# Patient Record
Sex: Male | Born: 1950 | Race: Black or African American | Hispanic: No | Marital: Single | Smoking: Current every day smoker
Health system: Southern US, Community
[De-identification: ages and names within clinical notes are randomized; demographics above are authoritative.]

## PROBLEM LIST (undated history)

## (undated) DIAGNOSIS — R569 Unspecified convulsions: Secondary | ICD-10-CM

## (undated) DIAGNOSIS — I255 Ischemic cardiomyopathy: Secondary | ICD-10-CM

## (undated) DIAGNOSIS — I10 Essential (primary) hypertension: Secondary | ICD-10-CM

## (undated) DIAGNOSIS — I209 Angina pectoris, unspecified: Secondary | ICD-10-CM

## (undated) DIAGNOSIS — F19239 Other psychoactive substance dependence with withdrawal, unspecified: Secondary | ICD-10-CM

## (undated) DIAGNOSIS — F19939 Other psychoactive substance use, unspecified with withdrawal, unspecified: Secondary | ICD-10-CM

## (undated) DIAGNOSIS — I509 Heart failure, unspecified: Secondary | ICD-10-CM

## (undated) DIAGNOSIS — Z720 Tobacco use: Secondary | ICD-10-CM

## (undated) DIAGNOSIS — I2510 Atherosclerotic heart disease of native coronary artery without angina pectoris: Secondary | ICD-10-CM

## (undated) DIAGNOSIS — I739 Peripheral vascular disease, unspecified: Secondary | ICD-10-CM

## (undated) DIAGNOSIS — I219 Acute myocardial infarction, unspecified: Secondary | ICD-10-CM

## (undated) DIAGNOSIS — F1010 Alcohol abuse, uncomplicated: Secondary | ICD-10-CM

## (undated) DIAGNOSIS — E785 Hyperlipidemia, unspecified: Secondary | ICD-10-CM

## (undated) DIAGNOSIS — M1990 Unspecified osteoarthritis, unspecified site: Secondary | ICD-10-CM

## (undated) DIAGNOSIS — I639 Cerebral infarction, unspecified: Secondary | ICD-10-CM

## (undated) HISTORY — PX: HERNIA REPAIR: SHX51

## (undated) HISTORY — DX: Unspecified convulsions: R56.9

---

## 2009-03-11 ENCOUNTER — Ambulatory Visit (HOSPITAL_COMMUNITY): Admission: RE | Admit: 2009-03-11 | Discharge: 2009-03-11 | Payer: Self-pay | Admitting: Family Medicine

## 2010-07-15 LAB — COMPREHENSIVE METABOLIC PANEL
Alkaline Phosphatase: 40 U/L (ref 39–117)
BUN: 12 mg/dL (ref 6–23)
CO2: 27 mEq/L (ref 19–32)
Chloride: 102 mEq/L (ref 96–112)
Glucose, Bld: 89 mg/dL (ref 70–99)
Potassium: 3.9 mEq/L (ref 3.5–5.1)
Total Bilirubin: 0.8 mg/dL (ref 0.3–1.2)

## 2010-07-15 LAB — CBC
HCT: 43.9 % (ref 39.0–52.0)
Hemoglobin: 15 g/dL (ref 13.0–17.0)
RBC: 5.06 MIL/uL (ref 4.22–5.81)
WBC: 5.6 10*3/uL (ref 4.0–10.5)

## 2010-07-15 LAB — TSH: TSH: 0.7 u[IU]/mL (ref 0.350–4.500)

## 2010-07-15 LAB — PSA: PSA: 1.42 ng/mL (ref 0.10–4.00)

## 2010-07-15 LAB — LIPID PANEL: VLDL: 12 mg/dL (ref 0–40)

## 2010-07-15 LAB — MICROALBUMIN, URINE: Microalb, Ur: 1.58 mg/dL (ref 0.00–1.89)

## 2011-05-05 DIAGNOSIS — I10 Essential (primary) hypertension: Secondary | ICD-10-CM | POA: Diagnosis not present

## 2011-05-06 DIAGNOSIS — I10 Essential (primary) hypertension: Secondary | ICD-10-CM | POA: Diagnosis not present

## 2011-05-06 DIAGNOSIS — E785 Hyperlipidemia, unspecified: Secondary | ICD-10-CM | POA: Diagnosis not present

## 2011-12-23 DIAGNOSIS — I10 Essential (primary) hypertension: Secondary | ICD-10-CM | POA: Diagnosis not present

## 2012-01-13 DIAGNOSIS — I10 Essential (primary) hypertension: Secondary | ICD-10-CM | POA: Diagnosis not present

## 2012-01-25 ENCOUNTER — Other Ambulatory Visit: Payer: Self-pay | Admitting: Family Medicine

## 2012-01-25 ENCOUNTER — Ambulatory Visit
Admission: RE | Admit: 2012-01-25 | Discharge: 2012-01-25 | Disposition: A | Discharge status: Home / Self Care | Payer: Medicare Other | Source: Ambulatory Visit | Attending: Family Medicine | Admitting: Family Medicine

## 2012-01-25 DIAGNOSIS — F17200 Nicotine dependence, unspecified, uncomplicated: Secondary | ICD-10-CM

## 2012-01-25 DIAGNOSIS — I10 Essential (primary) hypertension: Secondary | ICD-10-CM

## 2012-01-25 DIAGNOSIS — Z125 Encounter for screening for malignant neoplasm of prostate: Secondary | ICD-10-CM | POA: Diagnosis not present

## 2012-02-02 ENCOUNTER — Emergency Department (HOSPITAL_COMMUNITY)
Admission: EM | Admit: 2012-02-02 | Discharge: 2012-02-03 | Disposition: A | Discharge status: Home / Self Care | Payer: Medicare Other | Attending: Emergency Medicine | Admitting: Emergency Medicine

## 2012-02-02 ENCOUNTER — Encounter (HOSPITAL_COMMUNITY): Payer: Self-pay | Admitting: Emergency Medicine

## 2012-02-02 DIAGNOSIS — T783XXA Angioneurotic edema, initial encounter: Secondary | ICD-10-CM | POA: Insufficient documentation

## 2012-02-02 DIAGNOSIS — I10 Essential (primary) hypertension: Secondary | ICD-10-CM | POA: Insufficient documentation

## 2012-02-02 DIAGNOSIS — Z888 Allergy status to other drugs, medicaments and biological substances status: Secondary | ICD-10-CM | POA: Diagnosis not present

## 2012-02-02 DIAGNOSIS — F17200 Nicotine dependence, unspecified, uncomplicated: Secondary | ICD-10-CM | POA: Diagnosis not present

## 2012-02-02 MED ORDER — PREDNISONE 20 MG PO TABS: 40.0000 mg | ORAL_TABLET | Freq: Every day | ORAL | Status: DC

## 2012-02-02 MED ORDER — DIPHENHYDRAMINE HCL 25 MG PO CAPS
25.0000 mg | ORAL_CAPSULE | Freq: Once | ORAL | Status: AC
  Administered 2012-02-02: 25 mg via ORAL
  Filled 2012-02-02: qty 1

## 2012-02-02 MED ORDER — FAMOTIDINE 20 MG PO TABS
20.0000 mg | ORAL_TABLET | Freq: Once | ORAL | Status: AC
  Administered 2012-02-02: 20 mg via ORAL
  Filled 2012-02-02: qty 1

## 2012-02-02 MED ORDER — PREDNISONE 20 MG PO TABS
60.0000 mg | ORAL_TABLET | Freq: Once | ORAL | Status: AC
  Administered 2012-02-02: 60 mg via ORAL
  Filled 2012-02-02: qty 3

## 2012-02-02 NOTE — ED Notes (Signed)
MD Pickering at bedside updating patient.  

## 2012-02-02 NOTE — ED Provider Notes (Signed)
History     CSN: [id number]  Arrival date & time 02/02/12  2012   First MD Initiated Contact with Patient 02/02/12 2117      No chief complaint on file.   (Consider location/radiation/quality/duration/timing/severity/associated sxs/prior treatment) The history is provided by the patient.   patient presents with lip swelling. He is on lisinopril. It began today. He states that he woke with it. He states it began of the lower lip and then around 2 hours prior to arrival the upper lip began to swell. No trouble swallowing. No tongue swelling. No no difficulty breathing. He has not had episodes like this before.   Past Medical History  Diagnosis Date  . Hypertension     History reviewed. No pertinent past surgical history.  No family history on file.  History  Substance Use Topics  . Smoking status: Current Every Day Smoker  . Smokeless tobacco: Not on file  . Alcohol Use: Yes      Review of Systems  Constitutional: Negative for activity change and appetite change.  HENT: Negative for neck stiffness.        Lip swelling.  Eyes: Negative for pain.  Respiratory: Negative for chest tightness and shortness of breath.   Cardiovascular: Negative for chest pain and leg swelling.  Gastrointestinal: Negative for nausea, vomiting, abdominal pain and diarrhea.  Genitourinary: Negative for flank pain.  Musculoskeletal: Negative for back pain.  Skin: Negative for rash.  Neurological: Negative for weakness, numbness and headaches.  Psychiatric/Behavioral: Negative for behavioral problems.    Allergies  Lisinopril  Home Medications   Current Outpatient Rx  Name Route Sig Dispense Refill  . AMLODIPINE BESYLATE 10 MG PO TABS Oral Take 10 mg by mouth daily.    . FUROSEMIDE 20 MG PO TABS Oral Take 20 mg by mouth daily.    . MELOXICAM 7.5 MG PO TABS Oral Take 7.5 mg by mouth 2 (two) times daily.    [name] METOPROLOL TARTRATE 50 MG PO TABS Oral Take 50 mg by mouth 2 (two) times daily.     [name] PRAVASTATIN SODIUM 20 MG PO TABS Oral Take 20 mg by mouth daily.    [name] PREDNISONE 20 MG PO TABS Oral Take 2 tablets (40 mg total) by mouth daily. 6 tablet 0    BP 121/81  Pulse 62  Temp 97.7 F (36.5 C) (Oral)  Resp 20  SpO2 97%  Physical Exam  Constitutional: He is oriented to person, place, and time. He appears well-developed and well-nourished.  HENT:       Some swelling of left side of upper lip and right side lower lip. On reevaluation has some increased swelling on the left side of the lower lip. No posterior pharyngeal edema.  Cardiovascular: Normal rate and regular rhythm.   Pulmonary/Chest: Effort normal and breath sounds normal.  Abdominal: Soft. Bowel sounds are normal.  Musculoskeletal: Normal range of motion.  Neurological: He is alert and oriented to person, place, and time.    ED Course  Procedures (including critical care time)  Labs Reviewed - No data to display No results found.   1. Angioedema of lips       MDM  Angioedema of both lips. His been relatively stable over the few hour ED the visit and began at around 10 AM this morning. I doubt it is progress to a severe disease. Immediate related to lisinopril and it was stopped. He will followup with his primary care Dr. for changes in medication.  Juliet Rude. Rubin Payor, MD 02/03/12 [phone]

## 2012-02-02 NOTE — ED Notes (Addendum)
C/o lips swelling since waking up this morning. Denies injury.  Pt reports he started taking Lisinopril 1 month ago.  Denies sob.  No tongue swelling. NAD

## 2012-02-03 DIAGNOSIS — T7840XA Allergy, unspecified, initial encounter: Secondary | ICD-10-CM | POA: Diagnosis not present

## 2012-03-12 HISTORY — PX: CORONARY ANGIOPLASTY WITH STENT PLACEMENT: SHX49

## 2012-04-02 ENCOUNTER — Encounter (HOSPITAL_COMMUNITY): Payer: Self-pay | Admitting: Nurse Practitioner

## 2012-04-02 ENCOUNTER — Emergency Department (HOSPITAL_COMMUNITY): Payer: Medicare Other

## 2012-04-02 ENCOUNTER — Inpatient Hospital Stay (HOSPITAL_COMMUNITY)
Admission: EM | Admit: 2012-04-02 | Discharge: 2012-04-03 | DRG: 065 | Disposition: A | Discharge status: Home / Self Care | Payer: Medicare Other | Attending: Internal Medicine | Admitting: Internal Medicine

## 2012-04-02 DIAGNOSIS — R29810 Facial weakness: Secondary | ICD-10-CM | POA: Diagnosis present

## 2012-04-02 DIAGNOSIS — I10 Essential (primary) hypertension: Secondary | ICD-10-CM | POA: Diagnosis not present

## 2012-04-02 DIAGNOSIS — I6529 Occlusion and stenosis of unspecified carotid artery: Secondary | ICD-10-CM | POA: Diagnosis not present

## 2012-04-02 DIAGNOSIS — F17200 Nicotine dependence, unspecified, uncomplicated: Secondary | ICD-10-CM | POA: Diagnosis present

## 2012-04-02 DIAGNOSIS — I6340 Cerebral infarction due to embolism of unspecified cerebral artery: Secondary | ICD-10-CM | POA: Diagnosis not present

## 2012-04-02 DIAGNOSIS — I059 Rheumatic mitral valve disease, unspecified: Secondary | ICD-10-CM | POA: Diagnosis not present

## 2012-04-02 DIAGNOSIS — G459 Transient cerebral ischemic attack, unspecified: Secondary | ICD-10-CM | POA: Diagnosis present

## 2012-04-02 DIAGNOSIS — R5383 Other fatigue: Secondary | ICD-10-CM | POA: Diagnosis not present

## 2012-04-02 DIAGNOSIS — G9389 Other specified disorders of brain: Secondary | ICD-10-CM | POA: Diagnosis not present

## 2012-04-02 DIAGNOSIS — Z9861 Coronary angioplasty status: Secondary | ICD-10-CM | POA: Diagnosis not present

## 2012-04-02 DIAGNOSIS — Z7982 Long term (current) use of aspirin: Secondary | ICD-10-CM | POA: Diagnosis not present

## 2012-04-02 DIAGNOSIS — G8190 Hemiplegia, unspecified affecting unspecified side: Secondary | ICD-10-CM | POA: Insufficient documentation

## 2012-04-02 DIAGNOSIS — R5381 Other malaise: Secondary | ICD-10-CM | POA: Diagnosis not present

## 2012-04-02 DIAGNOSIS — I2510 Atherosclerotic heart disease of native coronary artery without angina pectoris: Secondary | ICD-10-CM | POA: Diagnosis present

## 2012-04-02 DIAGNOSIS — I6350 Cerebral infarction due to unspecified occlusion or stenosis of unspecified cerebral artery: Principal | ICD-10-CM | POA: Diagnosis present

## 2012-04-02 DIAGNOSIS — Z79899 Other long term (current) drug therapy: Secondary | ICD-10-CM

## 2012-04-02 LAB — CBC
MCH: 27.7 pg (ref 26.0–34.0)
Platelets: 230 10*3/uL (ref 150–400)
RBC: 5.23 MIL/uL (ref 4.22–5.81)
RDW: 14.9 % (ref 11.5–15.5)
WBC: 5.4 10*3/uL (ref 4.0–10.5)

## 2012-04-02 LAB — COMPREHENSIVE METABOLIC PANEL
ALT: 27 U/L (ref 0–53)
AST: 30 U/L (ref 0–37)
Albumin: 4.3 g/dL (ref 3.5–5.2)
Alkaline Phosphatase: 51 U/L (ref 39–117)
GFR calc Af Amer: 82 mL/min — ABNORMAL LOW (ref >90)
Glucose, Bld: 146 mg/dL — ABNORMAL HIGH (ref 70–99)
Potassium: 3.4 mEq/L — ABNORMAL LOW (ref 3.5–5.1)
Sodium: 135 mEq/L (ref 135–145)
Total Protein: 8.3 g/dL (ref 6.0–8.3)

## 2012-04-02 LAB — PROTIME-INR
INR: 0.92 (ref 0.00–1.49)
Prothrombin Time: 12.3 seconds (ref 11.6–15.2)

## 2012-04-02 LAB — DIFFERENTIAL
Basophils Absolute: 0 10*3/uL (ref 0.0–0.1)
Eosinophils Absolute: 0.3 10*3/uL (ref 0.0–0.7)
Lymphocytes Relative: 43 % (ref 12–46)
Lymphs Abs: 2.4 10*3/uL (ref 0.7–4.0)
Neutrophils Relative %: 43 % (ref 43–77)

## 2012-04-02 LAB — POCT I-STAT TROPONIN I

## 2012-04-02 MED ORDER — MELOXICAM 7.5 MG PO TABS
7.5000 mg | ORAL_TABLET | Freq: Two times a day (BID) | ORAL | Status: DC
  Administered 2012-04-02 – 2012-04-03 (×2): 7.5 mg via ORAL
  Filled 2012-04-02 (×3): qty 1

## 2012-04-02 MED ORDER — INFLUENZA VIRUS VACC SPLIT PF IM SUSP
0.5000 mL | INTRAMUSCULAR | Status: DC
  Filled 2012-04-02: qty 0.5

## 2012-04-02 MED ORDER — SIMVASTATIN 10 MG PO TABS
10.0000 mg | ORAL_TABLET | Freq: Every day | ORAL | Status: DC
  Filled 2012-04-02: qty 1

## 2012-04-02 MED ORDER — AMLODIPINE BESYLATE 10 MG PO TABS
10.0000 mg | ORAL_TABLET | Freq: Every day | ORAL | Status: DC
  Administered 2012-04-03: 10 mg via ORAL
  Filled 2012-04-02: qty 1

## 2012-04-02 MED ORDER — METOPROLOL TARTRATE 50 MG PO TABS
50.0000 mg | ORAL_TABLET | Freq: Two times a day (BID) | ORAL | Status: DC
  Administered 2012-04-02 – 2012-04-03 (×2): 50 mg via ORAL
  Filled 2012-04-02 (×3): qty 1

## 2012-04-02 MED ORDER — FUROSEMIDE 20 MG PO TABS
20.0000 mg | ORAL_TABLET | Freq: Every day | ORAL | Status: DC
  Administered 2012-04-03: 20 mg via ORAL
  Filled 2012-04-02: qty 1

## 2012-04-02 MED ORDER — ASPIRIN EC 325 MG PO TBEC: 325.0000 mg | DELAYED_RELEASE_TABLET | Freq: Every day | ORAL | Status: DC

## 2012-04-02 MED ORDER — STROKE: EARLY STAGES OF RECOVERY BOOK
Freq: Once | Status: AC
  Administered 2012-04-02: 23:00:00
  Filled 2012-04-02: qty 1

## 2012-04-02 NOTE — ED Notes (Signed)
Family at bedside. 

## 2012-04-02 NOTE — ED Notes (Signed)
Patient transported to CT 

## 2012-04-02 NOTE — ED Notes (Signed)
Pt states approx 45 minutes ago ( 1730) he felt heaviness and numbness in L arm, states it has "Gotten a little better." pt appears to have L side facial droop. Denies pain. A&Ox4, speech is clear, grips are = bilaterally.

## 2012-04-02 NOTE — ED Notes (Signed)
RN on [facility] requested that MRI be performed before transferring to the floor.

## 2012-04-02 NOTE — H&P (Signed)
PCP:   [name], MD   Chief Complaint:  Left hand weak  HPI: 61 yo male h/o cad s/p stent 2008 in [place] comes in with sudden onset of left arm weakness/numbness and left facial drooping.  No prior h/o cva.  Symptoms quickly resolved in the ED.  He usually takes a baby asa a day but in the last month he accidentally got the full dose and has been taking full dose asa for a month.  No previous illnesses.  No fevers/n/v/d.  No cp or sob.  No abd pain.  hes feeling back to normal now.  Review of Systems:  O/w neg  Past Medical History: Past Medical History  Diagnosis Date  . Hypertension    Past Surgical History  Procedure Date  . Hernia repair     Medications: Prior to Admission medications   Medication Sig Start Date End Date Taking? Authorizing Provider  amLODipine (NORVASC) 10 MG tablet Take 10 mg by mouth daily.   Yes Historical Provider, MD  aspirin EC 325 MG tablet Take 325 mg by mouth daily.   Yes Historical Provider, MD  furosemide (LASIX) 20 MG tablet Take 20 mg by mouth daily.   Yes Historical Provider, MD  meloxicam (MOBIC) 7.5 MG tablet Take 7.5 mg by mouth 2 (two) times daily.   Yes Historical Provider, MD  metoprolol (LOPRESSOR) 50 MG tablet Take 50 mg by mouth 2 (two) times daily.   Yes Historical Provider, MD  pravastatin (PRAVACHOL) 20 MG tablet Take 20 mg by mouth daily.   Yes Historical Provider, MD    Allergies:   Allergies  Allergen Reactions  . Lisinopril Swelling    Social History:  reports that he has been smoking.  He does not have any smokeless tobacco history on file. He reports that he drinks alcohol. He reports that he does not use illicit drugs.  Family History: History reviewed. No pertinent family history.  Physical Exam: Filed Vitals:   04/02/12 1858 04/02/12 1900 04/02/12 1915 04/02/12 1930  BP:  154/100 155/100 143/88  Pulse:  69 80 77  Temp: 98.8 F (37.1 C)     TempSrc:      Resp:  22 18 16   SpO2:  98% 98% 98%    General appearance: alert, cooperative and no distress Neck: no JVD and supple, symmetrical, trachea midline Lungs: clear to auscultation bilaterally Heart: regular rate and rhythm, S1, S2 normal, no murmur, click, rub or gallop Abdomen: soft, non-tender; bowel sounds normal; no masses,  no organomegaly Extremities: extremities normal, atraumatic, no cyanosis or edema Pulses: 2+ and symmetric Skin: Skin color, texture, turgor normal. No rashes or lesions Neurologic: Grossly normal    Labs on Admission:   [facility] 04/02/12 1803  NA 135  K 3.4*  CL 95*  CO2 25  GLUCOSE 146*  BUN 12  CREATININE 1.11  CALCIUM 10.2  MG --  PHOS --    Basename 04/02/12 1803  AST 30  ALT 27  ALKPHOS 51  BILITOT 0.3  PROT 8.3  ALBUMIN 4.3    Basename 04/02/12 1803  WBC 5.4  NEUTROABS 2.4  HGB 14.5  HCT 43.1  MCV 82.4  PLT 230    Basename 04/02/12 1803  CKTOTAL --  CKMB --  CKMBINDEX --  TROPONINI <0.30   Radiological Exams on Admission: Ct Head Wo Contrast  04/02/2012  *RADIOLOGY REPORT*  Clinical Data: Code stroke, left-sided facial droop  CT HEAD WITHOUT CONTRAST  Technique:  Contiguous axial images  were obtained from the base of the skull through the vertex without contrast.  Comparison: None.  Findings: Prominent bilateral perivascular spaces are incidentally noted image 10.  Right frontal encephalomalacia is identified. No acute hemorrhage, acute infarction, or mass lesion is seen.  No midline shift.  Mild ethmoid mucoperiosteal thickening left maxillary sinus wall sclerosis.  No skull fracture.  IMPRESSION: No acute intracranial finding.  Remote right frontal encephalomalacia. Critical Value/emergent results were called by telephone at the time of interpretation on 04/03/2011 at 6:20 p.m. to Dr. Thad Ranger, who verbally acknowledged these results.   Original Report Authenticated By: Christiana Pellant, M.D.     Assessment/Plan  61 yo male with tia/cva symptoms which seem to be  resolved and mildly elevated troponin Principal Problem:  *Unspecified transient cerebral ischemia Active Problems:  Hemiplegia, unspecified, affecting nondominant side  Hypertension  CAD (coronary artery disease) s/p stent 2008 in [place]  Neuro already evaluated pt.  Place on full dose asa, full cva w/u including eeg.  ekg show q waves in inf lead and twi inflat leads.  Will also ask cards to evaluate pt with mildly elevated trop.  Cont to serial his troponins for now.  Place on tele.  Full code.  Evonte Prestage A 04/02/2012, 8:03 PM

## 2012-04-02 NOTE — Consult Note (Addendum)
Referring Physician: Ranae Palms    Chief Complaint: Left facial droop and left arm numbness and weakness  HPI: Randall Patel is an 61 y.o. male who reports that he was watching television and began to feel some numbness in his left hand.  When he got up to feed the dog he was unable to hold the dog food in his left hand and kept dropping everything.  He told his sister about his symptoms and he was brought to the ED.  Left facial droop was noted as well.  Current NIHSS of 0 with near complete resolution of symptoms.    LSN: [id number] tPA Given: No: Resolution of symptoms  Past Medical History  Diagnosis Date  . Hypertension     Past Surgical History  Procedure Date  . Hernia repair     Family history: Has 3 sisters and a brother all of which have had heart attacks.  His mother died of cancer.  His father died of a heart attack.    Social History:  reports that he has been smoking.  He does not have any smokeless tobacco history on file. He reports that he drinks alcohol. He reports that he does not use illicit drugs.  Patient drinks daily but has not had anything to drink since yesterday.  On disability.    Allergies:  Allergies  Allergen Reactions  . Lisinopril Swelling    Medications: I have reviewed the patient's current medications. Prior to Admission:  Current outpatient prescriptions:amLODipine (NORVASC) 10 MG tablet, Take 10 mg by mouth daily., Disp: , Rfl: ;  furosemide (LASIX) 20 MG tablet, Take 20 mg by mouth daily., Disp: , Rfl: ;  meloxicam (MOBIC) 7.5 MG tablet, Take 7.5 mg by mouth 2 (two) times daily., Disp: , Rfl: ;  metoprolol (LOPRESSOR) 50 MG tablet, Take 50 mg by mouth 2 (two) times daily., Disp: , Rfl:  pravastatin (PRAVACHOL) 20 MG tablet, Take 20 mg by mouth daily., Disp: , Rfl:   ROS: History obtained from the patient  General ROS: negative for - chills, fatigue, fever, night sweats, weight gain or weight loss Psychological ROS: negative for - behavioral  disorder, hallucinations, memory difficulties, mood swings or suicidal ideation Ophthalmic ROS: negative for - blurry vision, double vision, eye pain or loss of vision ENT ROS: negative for - epistaxis, nasal discharge, oral lesions, sore throat, tinnitus or vertigo Allergy and Immunology ROS: negative for - hives or itchy/watery eyes Hematological and Lymphatic ROS: negative for - bleeding problems, bruising or swollen lymph nodes Endocrine ROS: negative for - galactorrhea, hair pattern changes, polydipsia/polyuria or temperature intolerance Respiratory ROS: negative for - cough, hemoptysis, shortness of breath or wheezing Cardiovascular ROS: negative for - chest pain, dyspnea on exertion, edema or irregular heartbeat Gastrointestinal ROS: negative for - abdominal pain, diarrhea, hematemesis, nausea/vomiting or stool incontinence Genito-Urinary ROS: negative for - dysuria, hematuria, incontinence or urinary frequency/urgency Musculoskeletal ROS: negative for - joint swelling or muscular weakness Neurological ROS: as noted in HPI Dermatological ROS: negative for rash and skin lesion changes  Physical Examination: Blood pressure 161/100, pulse 95, temperature 98.7 F (37.1 C), temperature source Oral, resp. rate 18, SpO2 99.00%.  Neurologic Examination: Mental Status: Alert, oriented, thought content appropriate.  Speech fluent without evidence of aphasia.  Able to follow 3 step commands without difficulty. Cranial Nerves: II: Discs flat bilaterally; Visual fields grossly normal, pupils equal, round, reactive to light and accommodation III,IV, VI: ptosis not present, extra-ocular motions intact bilaterally V,VII: decreased left NLF, facial light  touch sensation normal bilaterally VIII: hearing normal bilaterally IX,X: gag reflex present XI: bilateral shoulder shrug XII: midline tongue extension Motor: Right : Upper extremity   5/5    Left:     Upper extremity   5/5  Lower extremity    5/5     Lower extremity   5/5 Tone and bulk:normal tone throughout; no atrophy noted Sensory: Pinprick and light touch decreased in the third and fourth digits on the left hand Deep Tendon Reflexes: 2+ and symmetric throughout Plantars: Right: downgoing   Left: downgoing Cerebellar: normal finger-to-nose and normal heel-to-shin test Gait: not tested CV: pulses palpable throughout   Laboratory Studies:  Basic Metabolic Panel: No results found for this basename: NA:5,K:5,CL:5,CO2:5,GLUCOSE:5,BUN:5,CREATININE:5,CALCIUM:3,MG:5,PHOS:5 in the last 168 hours  Liver Function Tests: No results found for this basename: AST:5,ALT:5,ALKPHOS:5,BILITOT:5,PROT:5,ALBUMIN:5 in the last 168 hours No results found for this basename: LIPASE:5,AMYLASE:5 in the last 168 hours No results found for this basename: AMMONIA:3 in the last 168 hours  CBC: No results found for this basename: WBC:5,NEUTROABS:5,HGB:5,HCT:5,MCV:5,PLT:5 in the last 168 hours  Cardiac Enzymes: No results found for this basename: CKTOTAL:5,CKMB:5,CKMBINDEX:5,TROPONINI:5 in the last 168 hours  BNP: No components found with this basename: POCBNP:5  CBG: No results found for this basename: GLUCAP:5 in the last 168 hours  Microbiology: No results found for this or any previous visit.  Coagulation Studies: No results found for this basename: LABPROT:5,INR:5 in the last 72 hours  Urinalysis: No results found for this basename: COLORURINE:2,APPERANCEUR:2,LABSPEC:2,PHURINE:2,GLUCOSEU:2,HGBUR:2,BILIRUBINUR:2,KETONESUR:2,PROTEINUR:2,UROBILINOGEN:2,NITRITE:2,LEUKOCYTESUR:2 in the last 168 hours  Lipid Panel:    Component Value Date/Time   CHOL  Value: 290        ATP III CLASSIFICATION:  <200     mg/dL   Desirable  161-096  mg/dL   Borderline High  >=045    mg/dL   High       * 40/98/1191 1003   TRIG 62 03/11/2009 1003   HDL 105 03/11/2009 1003   CHOLHDL 2.8 03/11/2009 1003   VLDL 12 03/11/2009 1003   LDLCALC  Value: 173         Total Cholesterol/HDL:CHD Risk Coronary Heart Disease Risk Table                     Men   Women  1/2 Average Risk   3.4   3.3  Average Risk       5.0   4.4  2 X Average Risk   9.6   7.1  3 X Average Risk  23.4   11.0        Use the calculated Patient Ratio above and the CHD Risk Table to determine the patient's CHD Risk.        ATP III CLASSIFICATION (LDL):  <100     mg/dL   Optimal  478-295  mg/dL   Near or Above                    Optimal  130-159  mg/dL   Borderline  621-308  mg/dL   High  >657     mg/dL   Very High* 84/69/6295 1003    HgbA1C:  No results found for this basename: HGBA1C    Urine Drug Screen:   No results found for this basename: labopia, cocainscrnur, labbenz, amphetmu, thcu, labbarb    Alcohol Level: No results found for this basename: ETH:2 in the last 168 hours   Imaging: Ct Head Wo Contrast  04/02/2012  *RADIOLOGY REPORT*  Clinical  Data: Code stroke, left-sided facial droop  CT HEAD WITHOUT CONTRAST  Technique:  Contiguous axial images were obtained from the base of the skull through the vertex without contrast.  Comparison: None.  Findings: Prominent bilateral perivascular spaces are incidentally noted image 10.  Right frontal encephalomalacia is identified. No acute hemorrhage, acute infarction, or mass lesion is seen.  No midline shift.  Mild ethmoid mucoperiosteal thickening left maxillary sinus wall sclerosis.  No skull fracture.  IMPRESSION: No acute intracranial finding.  Remote right frontal encephalomalacia. Critical Value/emergent results were called by telephone at the time of interpretation on 04/03/2011 at 6:20 p.m. to Dr. Thad Ranger, who verbally acknowledged these results.   Original Report Authenticated By: Christiana Pellant, M.D.     Assessment: 61 y.o. male presenting with left facial droop and left upper extremity numbness and weakness that has resolved.  Patient describes symptoms as marching up his arm.  With vascular risk factors can not rue out TIA.  CT  reviewed though and shows right frontal encephalomalcia. Patient describes no relevant history.  Unclear etiology.  Due to finding can not rule out seizure as well.  Stroke Risk Factors - hyperlipidemia, hypertension, smoking and CAD  Plan: 1. HgbA1c, fasting lipid panel 2. MRI, MRA  of the brain without contrast 3. Echocardiogram 4. Carotid dopplers 5. Prophylactic therapy-Antiplatelet med: Aspirin - dose 325mg  daily 6. Risk factor modification 7. Telemetry monitoring 8. Frequent neuro checks 9. EEG  Case discussed with Dr. Rene Paci, MD [facility] [phone] 04/02/2012, 6:33 PM

## 2012-04-02 NOTE — ED Provider Notes (Signed)
History     CSN: [id number]  Arrival date & time 04/02/12  1754   First MD Initiated Contact with Patient 04/02/12 1803      Chief Complaint  Patient presents with  . Code Stroke    (Consider location/radiation/quality/duration/timing/severity/associated sxs/prior treatment) HPI Pt had L arm numbness and weakness with L lower facial droop starting at 1730 today. Symptoms have now improved though continues to have numbness in 3rd and 4th digits of L hand and mild facial droop. Denies fever, chills, neck pain, HA, CP, SOB, nausea or vomiting Past Medical History  Diagnosis Date  . Hypertension     Past Surgical History  Procedure Date  . Hernia repair     History reviewed. No pertinent family history.  History  Substance Use Topics  . Smoking status: Current Every Day Smoker  . Smokeless tobacco: Not on file  . Alcohol Use: Yes      Review of Systems  Constitutional: Negative for fever, chills and fatigue.  HENT: Negative for trouble swallowing, neck pain and neck stiffness.   Eyes: Negative for visual disturbance.  Respiratory: Negative for shortness of breath.   Cardiovascular: Negative for chest pain, palpitations and leg swelling.  Gastrointestinal: Negative for nausea, vomiting, abdominal pain, diarrhea and constipation.  Musculoskeletal: Negative for myalgias and back pain.  Skin: Negative for rash and wound.  Neurological: Positive for facial asymmetry, weakness and numbness. Negative for dizziness, seizures, syncope, speech difficulty, light-headedness and headaches.  All other systems reviewed and are negative.    Allergies  Lisinopril  Home Medications   Current Outpatient Rx  Name  Route  Sig  Dispense  Refill  . AMLODIPINE BESYLATE 10 MG PO TABS   Oral   Take 10 mg by mouth daily.         . FUROSEMIDE 20 MG PO TABS   Oral   Take 20 mg by mouth daily.         . MELOXICAM 7.5 MG PO TABS   Oral   Take 7.5 mg by mouth 2 (two) times  daily.         [name] METOPROLOL TARTRATE 50 MG PO TABS   Oral   Take 50 mg by mouth 2 (two) times daily.         [name] PRAVASTATIN SODIUM 20 MG PO TABS   Oral   Take 20 mg by mouth daily.           BP 154/100  Pulse 69  Temp 98.8 F (37.1 C) (Oral)  Resp 22  SpO2 98%  Physical Exam  Nursing note and vitals reviewed. Constitutional: He is oriented to person, place, and time. He appears well-developed and well-nourished. No distress.  HENT:  Head: Normocephalic and atraumatic.  Mouth/Throat: Oropharynx is clear and moist.  Eyes: EOM are normal. Pupils are equal, round, and reactive to light.  Neck: Normal range of motion. Neck supple.  Cardiovascular: Normal rate and regular rhythm.   Pulmonary/Chest: Effort normal and breath sounds normal. No respiratory distress. He has no wheezes. He has no rales. He exhibits no tenderness.  Abdominal: Soft. Bowel sounds are normal. He exhibits no distension and no mass. There is no tenderness. There is no rebound and no guarding.  Musculoskeletal: Normal range of motion. He exhibits no edema and no tenderness.  Neurological: He is alert and oriented to person, place, and time.       5/5 motor in all ext. Finger to nose bl initact. Decreased sensation in  3rd and 4th digits of L hand otherwise normal. Question mild L mouth droop otherwise CN II-XII intact.   Skin: Skin is warm and dry. No rash noted. No erythema.  Psychiatric: He has a normal mood and affect. His behavior is normal.    ED Course  Procedures (including critical care time)  Labs Reviewed  COMPREHENSIVE METABOLIC PANEL - Abnormal; Notable for the following:    Potassium 3.4 (*)     Chloride 95 (*)     Glucose, Bld 146 (*)     GFR calc non Af Amer 71 (*)     GFR calc Af Amer 82 (*)     All other components within normal limits  POCT I-STAT TROPONIN I - Abnormal; Notable for the following:    Troponin i, poc 0.15 (*)     All other components within normal limits   PROTIME-INR  APTT  CBC  DIFFERENTIAL  TROPONIN I   Ct Head Wo Contrast  04/02/2012  *RADIOLOGY REPORT*  Clinical Data: Code stroke, left-sided facial droop  CT HEAD WITHOUT CONTRAST  Technique:  Contiguous axial images were obtained from the base of the skull through the vertex without contrast.  Comparison: None.  Findings: Prominent bilateral perivascular spaces are incidentally noted image 10.  Right frontal encephalomalacia is identified. No acute hemorrhage, acute infarction, or mass lesion is seen.  No midline shift.  Mild ethmoid mucoperiosteal thickening left maxillary sinus wall sclerosis.  No skull fracture.  IMPRESSION: No acute intracranial finding.  Remote right frontal encephalomalacia. Critical Value/emergent results were called by telephone at the time of interpretation on 04/03/2011 at 6:20 p.m. to Dr. Thad Ranger, who verbally acknowledged these results.   Original Report Authenticated By: Christiana Pellant, M.D.      1. Unspecified transient cerebral ischemia   2. Hemiplegia, unspecified, affecting nondominant side      Date: 04/02/2012  Rate: 94  Rhythm: normal sinus rhythm  QRS Axis: normal  Intervals: normal  ST/T Wave abnormalities: nonspecific T wave changes  Conduction Disutrbances:none  Narrative Interpretation:   Old EKG Reviewed: none available T wave inversion in inferior and lateral leads. No old for comparison   MDM  Seen by neurology. Not candidate for tPA. Admit to medicine  Discussed with Dr Onalee Hua. Will admit to tele floor. Asked to have cards consult.       Loren Racer, MD 04/02/12 [phone]

## 2012-04-02 NOTE — ED Notes (Signed)
Activated CODE STROKE via [facility]

## 2012-04-03 ENCOUNTER — Inpatient Hospital Stay (HOSPITAL_COMMUNITY): Payer: Medicare Other

## 2012-04-03 DIAGNOSIS — I059 Rheumatic mitral valve disease, unspecified: Secondary | ICD-10-CM

## 2012-04-03 DIAGNOSIS — I6340 Cerebral infarction due to embolism of unspecified cerebral artery: Secondary | ICD-10-CM | POA: Insufficient documentation

## 2012-04-03 LAB — LIPID PANEL
Cholesterol: 286 mg/dL — ABNORMAL HIGH (ref 0–200)
HDL: 109 mg/dL (ref >39)
Total CHOL/HDL Ratio: 2.6 RATIO
Triglycerides: 108 mg/dL (ref <150)

## 2012-04-03 LAB — TROPONIN I: Troponin I: <0.3 ng/mL (ref <0.30)

## 2012-04-03 MED ORDER — CLOPIDOGREL BISULFATE 75 MG PO TABS: 75.0000 mg | ORAL_TABLET | Freq: Every day | ORAL | Status: DC

## 2012-04-03 MED ORDER — CLOPIDOGREL BISULFATE 75 MG PO TABS
75.0000 mg | ORAL_TABLET | Freq: Every day | ORAL | Status: DC
  Administered 2012-04-03: 75 mg via ORAL
  Filled 2012-04-03: qty 1

## 2012-04-03 MED ORDER — ATORVASTATIN CALCIUM 40 MG PO TABS: 40.0000 mg | ORAL_TABLET | Freq: Every day | ORAL | Status: DC

## 2012-04-03 NOTE — Evaluation (Signed)
Physical Therapy Evaluation/Discharge Note Patient Details Name: Randall Patel MRN: [id number] DOB: 1950-10-21 Today's Date: 04/03/2012 Time: 7829-5621 PT Time Calculation (min): 12 min  PT Assessment / Plan / Recommendation Clinical Impression  61 y.o. male admitted to [facility] for left sided arm and face numbness and weakness.  MRI/A confirmed acute stroke.  Pt's symptoms have 95% resolved and his mobility and strength seem to be at baseline.  He has no acute or f/u PT or OT needs at this time and is safe to discharge home when MD deems he is ready.  I did review with him signs and symptoms of a stroke and why it is important to get to the hospital fast.  He verbalized understanding.      PT Assessment  Patent does not need any further PT services    Follow Up Recommendations  No PT follow up    Does the patient have the potential to tolerate intense rehabilitation    NA  Barriers to Discharge  none      Equipment Recommendations  None recommended by PT    Recommendations for Other Services   none     Precautions / Restrictions Precautions Precautions: None   Pertinent Vitals/Pain No reports of pain      Mobility  Bed Mobility Bed Mobility: Supine to Sit;Sitting - Scoot to Edge of Bed Supine to Sit: 7: Independent Sitting - Scoot to [place] of Bed: 7: Independent Transfers Transfers: Sit to Stand;Stand to Sit Sit to Stand: 7: Independent Stand to Sit: 7: Independent Ambulation/Gait Ambulation/Gait Assistance: 7: Independent Ambulation Distance (Feet): 150 Feet Assistive device: None Ambulation/Gait Assistance Details: no assistive device, walking and talking, normal speed Gait Pattern: Within Functional Limits      Visit Information  Last PT Received On: 04/03/12 Assistance Needed: +1 Reason Eval/Treat Not Completed: Patient at procedure or test/unavailable (at MRI)    Subjective Data  Subjective: Pt reports the only deficit that he has left now is some numbness  in his 3rd finger of his left hand.   Patient Stated Goal: Go home today.  Not have another stroke   Prior Functioning  Home Living Lives With: Family Available Help at Discharge: Family Type of Home: House Prior Function Vocation: On disability Communication Communication: No difficulties Dominant Hand: Right    Cognition  Overall Cognitive Status: Appears within functional limits for tasks assessed/performed Arousal/Alertness: Awake/alert    Extremity/Trunk Assessment Right Upper Extremity Assessment RUE ROM/Strength/Tone: Within functional levels Left Upper Extremity Assessment LUE ROM/Strength/Tone: Within functional levels Right Lower Extremity Assessment RLE ROM/Strength/Tone: Within functional levels Left Lower Extremity Assessment LLE ROM/Strength/Tone: Within functional levels   Balance Balance Balance Assessed: Yes High Level Balance High Level Balance Comments: 360 degree turn, tandem stand bil, pick up object from floor all independently  End of Session PT - End of Session Activity Tolerance: Patient tolerated treatment well Patient left: in bed;Other (comment) (seated EOB) Nurse Communication: Mobility status    Randall Patel, PT, DPT [phone]   04/03/2012, 11:57 AM

## 2012-04-03 NOTE — Progress Notes (Signed)
Utilization review completed.  

## 2012-04-03 NOTE — Progress Notes (Signed)
Routine EEG completed.  

## 2012-04-03 NOTE — Evaluation (Signed)
Speech Language Pathology Evaluation Patient Details Name: Randall Patel MRN: [id number] DOB: [birth year] Today's Date: 04/03/2012 Time: 1044-1100 SLP Time Calculation (min): 16 min  Problem List:  Patient Active Problem List  Diagnosis  . Unspecified transient cerebral ischemia  . Hemiplegia, unspecified, affecting nondominant side  . Hypertension  . CAD (coronary artery disease) s/p stent 2008 in [place]  . Cerebral embolism with cerebral infarction   Past Medical History:  Past Medical History  Diagnosis Date  . Hypertension   . MI (myocardial infarction)    Past Surgical History:  Past Surgical History  Procedure Date  . Hernia repair    HPI:  61 yo male h/o cad s/p stent 2008 in [place] comes in with sudden onset of left arm weakness/numbness and left facial drooping. No prior h/o cva. Symptoms quickly resolved in the ED. He usually takes a baby asa a day but in the last month he accidentally got the full dose and has been taking full dose asa for a month. No previous illnesses. No fevers/n/v/d. No cp or sob. No abd pain. hes feeling back to normal now. MRI shows Small patchy acute infarcts in the right motor strip and underlying white matter.    Assessment / Plan / Recommendation Clinical Impression  Pt presents with adequate cognitive linguistic function. No acute deficits noted. Slight left labial droop still evident with no impact on speech intelligibility. Pt does not need SLP f/u at this time. Discussed imporetance of f/u with MD if cognitive deficits arise after d/c. Discussed signs of stroke. No further education needed. WIll sign off.     SLP Assessment  Patient does not need any further Speech Lanaguage Pathology Services    Follow Up Recommendations       Frequency and Duration        Pertinent Vitals/Pain NA   SLP Goals     SLP Evaluation Prior Functioning  Cognitive/Linguistic Baseline: Within functional limits Type of Home: House Lives With:   (sister) Available Help at Discharge: Family Vocation: On disability   Cognition  Overall Cognitive Status: Appears within functional limits for tasks assessed Arousal/Alertness: Awake/alert Orientation Level: Oriented X4 Attention: Alternating Alternating Attention: Appears intact Memory: Appears intact Awareness: Appears intact Problem Solving: Appears intact Safety/Judgment: Appears intact    Comprehension  Auditory Comprehension Overall Auditory Comprehension: Appears within functional limits for tasks assessed    Expression Verbal Expression Overall Verbal Expression: Appears within functional limits for tasks assessed   Oral / Motor Oral Motor/Sensory Function Overall Oral Motor/Sensory Function: Impaired Labial ROM: Within Functional Limits Labial Symmetry: Abnormal symmetry left Labial Strength: Within Functional Limits Labial Sensation: Within Functional Limits Lingual ROM: Within Functional Limits Lingual Symmetry: Within Functional Limits Lingual Strength: Within Functional Limits Lingual Sensation: Within Functional Limits Facial ROM: Within Functional Limits Facial Symmetry: Within Functional Limits Facial Strength: Within Functional Limits Facial Sensation: Within Functional Limits Velum: Within Functional Limits Mandible: Within Functional Limits Motor Speech Overall Motor Speech: Appears within functional limits for tasks assessed   GO    [facility], MA CCC-SLP [phone]  Claudine Mouton 04/03/2012, 11:15 AM

## 2012-04-03 NOTE — Progress Notes (Signed)
*  PRELIMINARY RESULTS* Echocardiogram 2D Echocardiogram has been performed.  Jeryl Columbia 04/03/2012, 9:21 AM

## 2012-04-03 NOTE — Progress Notes (Signed)
Subjective: Patient reports no new symptoms.  Tip of third finger is numb.  Fourth finger is no longer numb.  MRI of the brain has been reviewed and shows acute small patchy infarcts in the right motor strip.  MRA shows poor flow in the sylvian division of the right MCA.  Further work up pending.  On further conversation with the patient it seems that he has been taking an ASA a day at home prior to admission.    Objective: Current vital signs: BP 141/89  Pulse 62  Temp 98.8 F (37.1 C) (Oral)  Resp 18  Ht 5\' 5"  (1.651 m)  Wt 72.077 kg (158 lb 14.4 oz)  BMI 26.44 kg/m2  SpO2 100% Vital signs in last 24 hours: Temp:  [97.5 F (36.4 C)-98.8 F (37.1 C)] 98.8 F (37.1 C) (12/23 0600) Pulse Rate:  [52-96] 62  (12/23 0600) Resp:  [16-25] 18  (12/23 0600) BP: (127-161)/(77-100) 141/89 mmHg (12/23 0600) SpO2:  [96 %-100 %] 100 % (12/23 0600) Weight:  [72.077 kg (158 lb 14.4 oz)] 72.077 kg (158 lb 14.4 oz) (12/22 2229)  Intake/Output from previous day: 12/22 0701 - 12/23 0700 In: 480 [P.O.:480] Out: 475 [Urine:475] Intake/Output this shift:   Nutritional status: NPO  Neurologic Exam: Mental Status:  Alert, oriented, thought content appropriate. Speech fluent without evidence of aphasia. Able to follow 3 step commands without difficulty.  Cranial Nerves:  II: Discs flat bilaterally; Visual fields grossly normal, pupils equal, round, reactive to light and accommodation  III,IV, VI: ptosis not present, extra-ocular motions intact bilaterally  V,VII: decreased left NLF, facial light touch sensation normal bilaterally  VIII: hearing normal bilaterally  IX,X: gag reflex present  XI: bilateral shoulder shrug  XII: midline tongue extension  Motor:  Right : Upper extremity 5/5 Left: Upper extremity 5/5  Lower extremity 5/5 Lower extremity 5/5  Tone and bulk:normal tone throughout; no atrophy noted  Sensory: Pinprick and light touch decreased in the third digit on the left hand  Deep  Tendon Reflexes: 2+ and symmetric throughout  Plantars:  Right: downgoing     Left: downgoing  Cerebellar:  normal finger-to-nose and normal heel-to-shin test  Gait: not tested  CV: pulses palpable throughout    Lab Results: Basic Metabolic Panel:  Lab 04/02/12 6578  NA 135  K 3.4*  CL 95*  CO2 25  GLUCOSE 146*  BUN 12  CREATININE 1.11  CALCIUM 10.2  MG --  PHOS --    Liver Function Tests:  Lab 04/02/12 1803  AST 30  ALT 27  ALKPHOS 51  BILITOT 0.3  PROT 8.3  ALBUMIN 4.3   No results found for this basename: LIPASE:5,AMYLASE:5 in the last 168 hours No results found for this basename: AMMONIA:3 in the last 168 hours  CBC:  Lab 04/02/12 1803  WBC 5.4  NEUTROABS 2.4  HGB 14.5  HCT 43.1  MCV 82.4  PLT 230    Cardiac Enzymes:  Lab 04/03/12 0530 04/02/12 2225 04/02/12 1803  CKTOTAL -- -- --  CKMB -- -- --  CKMBINDEX -- -- --  TROPONINI <0.30 <0.30 <0.30    Lipid Panel:  Lab 04/03/12 0545  CHOL 286*  TRIG 108  HDL 109  CHOLHDL 2.6  VLDL 22  LDLCALC 469*    CBG: No results found for this basename: GLUCAP:5 in the last 168 hours  Microbiology: No results found for this or any previous visit.  Coagulation Studies:  [facility] 04/02/12 1803  LABPROT 12.3  INR  0.92    Imaging: Ct Head Wo Contrast  04/02/2012  *RADIOLOGY REPORT*  Clinical Data: Code stroke, left-sided facial droop  CT HEAD WITHOUT CONTRAST  Technique:  Contiguous axial images were obtained from the base of the skull through the vertex without contrast.  Comparison: None.  Findings: Prominent bilateral perivascular spaces are incidentally noted image 10.  Right frontal encephalomalacia is identified. No acute hemorrhage, acute infarction, or mass lesion is seen.  No midline shift.  Mild ethmoid mucoperiosteal thickening left maxillary sinus wall sclerosis.  No skull fracture.  IMPRESSION: No acute intracranial finding.  Remote right frontal encephalomalacia. Critical Value/emergent  results were called by telephone at the time of interpretation on 04/03/2011 at 6:20 p.m. to Dr. Thad Ranger, who verbally acknowledged these results.   Original Report Authenticated By: Christiana Pellant, M.D.    Mr Brain Wo Contrast  04/03/2012  *RADIOLOGY REPORT*  Clinical Data:  61 year old male with acute onset left upper extremity weakness, numbness and left facial droop.  Comparison: Head CT 04/02/2012.  MRI HEAD WITHOUT CONTRAST  Technique: Multiplanar, multiecho pulse sequences of the brain and surrounding structures were obtained according to standard protocol without intravenous contrast.  Findings: There are small patchy foci of mild to moderately restricted diffusion in the right motor strip and underlying white matter in the superior frontal gyrus (series 4 images 19 - 21). Only subtle T2 and FLAIR hyperintensity is associated.  No associated mass effect or hemorrhage.  No left hemisphere or posterior fossa diffusion abnormality. Chronic encephalomalacia in the anterior superior right frontal gyrus as seen on the earlier CT.  Major intracranial vascular flow voids are preserved. No midline shift, ventriculomegaly, mass effect, evidence of mass lesion, extra-axial collection or acute intracranial hemorrhage. Cervicomedullary junction and pituitary are within normal limits. Incidental dilated perivascular spaces in the inferior basal ganglia.  Aside from the above findings, gray and white matter signal is within normal limits for age throughout the brain. Negative visualized cervical spine.  Normal bone marrow signal.  Chronic left lamina papyracea fracture.  Mild paranasal sinus mucosal thickening.  Mastoids are clear. Visualized orbit soft tissues are within normal limits.  Negative scalp soft tissues.  IMPRESSION: 1.  Small patchy acute infarcts in the right motor strip and underlying white matter.  No mass effect or hemorrhage. 2.  See MRA findings below. 3.  Chronic right anterior superior frontal  lobe infarct.  MRA HEAD WITHOUT CONTRAST  Technique: Angiographic images of the Circle of Willis were obtained using MRA technique without  intravenous contrast.  Findings: Antegrade flow in the posterior circulation codominant distal vertebral arteries.  PICA vessels are within normal limits. Normal vertebrobasilar junction.  Mild motion artifact at the level of the mid basilar.  No basilar artery stenosis.  SCA and left PCA origins are within normal limits.  Fetal type right PCA origin. Left posterior communicating artery diminutive or absent.  Antegrade flow in both ICA siphons.  The right ICA siphon appears mildly nondominant.  Mild motion artifact. Mild ICA siphon irregularity compatible with atherosclerosis.  No ICA stenosis.  Ophthalmic and posterior communicating artery origins are within normal limits.  Dominant left ICA terminus.  Normal MCA and left ACA origins.  The right ACA A1 segment is absent.  Right ACA supplied from the anterior communicating artery which is within normal limits.  Visualized ACA branches are within normal limits. Mild left MCA M1 segment irregularity.  Left MCA branches are within normal limits.  Right MCA origin is within normal limits.  Incidental right lenticulostriate artery infundibulum.  Right MCA M1 segment is only slightly irregular.  At the right MCA bifurcation a dominant superior posterior M2 branches patent, but there is attenuated flow in the dominant inferior posterior M2 branch (series 9 image 94, series 905 image 12).  IMPRESSION: 1.  Poor flow in the inferior posterior right MCA sylvian division. Right MCA bifurcation and M1 segment are patent. 2.  Mild anterior circulation atherosclerosis otherwise. 3.  Negative posterior circulation.   Original Report Authenticated By: Erskine Speed, M.D.    Mr Mra Head/brain Wo Cm  04/03/2012  *RADIOLOGY REPORT*  Clinical Data:  61 year old male with acute onset left upper extremity weakness, numbness and left facial droop.   Comparison: Head CT 04/02/2012.  MRI HEAD WITHOUT CONTRAST  Technique: Multiplanar, multiecho pulse sequences of the brain and surrounding structures were obtained according to standard protocol without intravenous contrast.  Findings: There are small patchy foci of mild to moderately restricted diffusion in the right motor strip and underlying white matter in the superior frontal gyrus (series 4 images 19 - 21). Only subtle T2 and FLAIR hyperintensity is associated.  No associated mass effect or hemorrhage.  No left hemisphere or posterior fossa diffusion abnormality. Chronic encephalomalacia in the anterior superior right frontal gyrus as seen on the earlier CT.  Major intracranial vascular flow voids are preserved. No midline shift, ventriculomegaly, mass effect, evidence of mass lesion, extra-axial collection or acute intracranial hemorrhage. Cervicomedullary junction and pituitary are within normal limits. Incidental dilated perivascular spaces in the inferior basal ganglia.  Aside from the above findings, gray and white matter signal is within normal limits for age throughout the brain. Negative visualized cervical spine.  Normal bone marrow signal.  Chronic left lamina papyracea fracture.  Mild paranasal sinus mucosal thickening.  Mastoids are clear. Visualized orbit soft tissues are within normal limits.  Negative scalp soft tissues.  IMPRESSION: 1.  Small patchy acute infarcts in the right motor strip and underlying white matter.  No mass effect or hemorrhage. 2.  See MRA findings below. 3.  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  MRA HEAD WITHOUT CONTRAST  Technique: Angiographic images of the Circle of Willis were obtained using MRA technique without  intravenous contrast.  Findings: Antegrade flow in the posterior circulation codominant distal vertebral arteries.  PICA vessels are within normal limits. Normal vertebrobasilar junction.  Mild motion artifact at the level of the mid basilar.   No basilar artery stenosis.  SCA and left PCA origins are within normal limits.  Fetal type right PCA origin. Left posterior communicating artery diminutive or absent.  Antegrade flow in both ICA siphons.  The right ICA siphon appears mildly nondominant.  Mild motion artifact. Mild ICA siphon irregularity compatible with atherosclerosis.  No ICA stenosis.  Ophthalmic and posterior communicating artery origins are within normal limits.  Dominant left ICA terminus.  Normal MCA and left ACA origins.  The right ACA A1 segment is absent.  Right ACA supplied from the anterior communicating artery which is within normal limits.  Visualized ACA branches are within normal limits. Mild left MCA M1 segment irregularity.  Left MCA branches are within normal limits.  Right MCA origin is within normal limits.  Incidental right lenticulostriate artery infundibulum.  Right MCA M1 segment is only slightly irregular.  At the right MCA bifurcation a dominant superior posterior M2 branches patent, but there is attenuated flow in the dominant inferior posterior M2 branch (series 9 image 94, series 905 image  12).  IMPRESSION: 1.  Poor flow in the inferior posterior right MCA sylvian division. Right MCA bifurcation and M1 segment are patent. 2.  Mild anterior circulation atherosclerosis otherwise. 3.  Negative posterior circulation.   Original Report Authenticated By: Erskine Speed, M.D.     Medications:  I have reviewed the patient's current medications. Scheduled:   . amLODipine  10 mg Oral Daily  . clopidogrel  75 mg Oral Q breakfast  . furosemide  20 mg Oral Daily  . influenza  inactive virus vaccine  0.5 mL Intramuscular Tomorrow-1000  . meloxicam  7.5 mg Oral BID  . metoprolol  50 mg Oral BID  . simvastatin  10 mg Oral q1800    Assessment/Plan:  Patient Active Hospital Problem List: Acute Infarct (04/02/2012)   Assessment: MRI shows acute right motor strip infarcts.  Patient only minimally affected clinically.   Remaining work up pending.  Taking ASA at home   Plan:  1.  D/C ASA  2.  Start Plavix 75mg  daily  3.  Will follow up remaining work up Hemiplegia, unspecified, affecting nondominant side (04/02/2012)   Assessment: Resolved.  Only residual is some minor numbness     LOS: 1 day   Thana Farr, MD [facility] [phone] 04/03/2012  9:50 AM

## 2012-04-03 NOTE — Progress Notes (Signed)
PT Cancellation Note  Patient Details Name: Randall Patel MRN: [id number] DOB: Apr 14, 1950   Cancelled Treatment:    Reason Eval/Treat Not Completed: Patient at procedure or test/unavailable (at MRI)   Rollene Rotunda. Welford Christmas, PT, DPT [phone]   04/03/2012, 8:43 AM

## 2012-04-03 NOTE — Progress Notes (Signed)
Bilateral:  No evidence of hemodynamically significant internal carotid artery stenosis.   Vertebral artery flow is antegrade.     

## 2012-04-03 NOTE — Discharge Summary (Signed)
Physician Discharge Summary  Randall Patel AVW:[id number] DOB: [birth year] DOA: 04/02/2012  PCP: [name], MD  Admit date: 04/02/2012 Discharge date: 04/03/2012  Time spent: 60 minutes  Recommendations for Outpatient Follow-up:  - follow up with PCP.  Discharge Diagnoses:  Principal Problem:  *Unspecified transient cerebral ischemia Active Problems:  Hypertension  CAD (coronary artery disease) s/p stent 2008 in [place]   Discharge Condition: stable  Diet recommendation: heart healthy  Filed Weights   04/02/12 2229  Weight: 72.077 kg (158 lb 14.4 oz)    History of present illness:  61 yo male h/o cad s/p stent 2008 in [place] comes in with sudden onset of left arm weakness/numbness and left facial drooping. No prior h/o cva. Symptoms quickly resolved in the ED. He usually takes a baby asa a day but in the last month he accidentally got the full dose and has been taking full dose asa for a month. No previous illnesses. No fevers/n/v/d. No cp or sob. No abd pain. hes feeling back to normal now.   Hospital Course:  Unspecified transient cerebral ischemia (04/02/2012) - MRI 12.23.2013: Small patchy acute infarcts on right. - carotid no ICA stenosis.  - PT/OT consult no needs  - Swallowing eval. No restriction.  - Plavix, stains for home. - LDL < 160  - no events on telemetry.  - Appreciate neurology consult  - tabacco counseling.   Hypertension () - lasix, Norvasc and metoprolol.  - mildly high, will allow some degree of permesive HTN.  - titrate BP meds. as an outpatient.   CAD (coronary artery disease) s/p stent 2008 in [place] (04/02/2012) - stains, plavix.   Procedures:  Carotid doppler: no ICA  Consultations:  neurology  Discharge Exam: Filed Vitals:   04/03/12 0000 04/03/12 0200 04/03/12 0400 04/03/12 0600  BP: 158/94 150/86 143/95 141/89  Pulse: 52 54 59 62  Temp: 98.4 F (36.9 C) 97.5 F (36.4 C) 97.9 F (36.6 C) 98.8 F (37.1 C)  TempSrc:     Oral  Resp: 18 18 18 18   Height:      Weight:      SpO2: 99% 99% 100% 100%    See progress note Discharge Instructions      Discharge Orders    Future Appointments: Provider: Department: Dept Phone: Center:   04/03/2012 12:45 PM [facility] [phone] None     Future Orders Please Complete By Expires   Diet - low sodium heart healthy      Increase activity slowly          Medication List     As of 04/03/2012 10:22 AM    STOP taking these medications         aspirin EC 325 MG tablet      TAKE these medications         amLODipine 10 MG tablet   Commonly known as: NORVASC   Take 10 mg by mouth daily.      clopidogrel 75 MG tablet   Commonly known as: PLAVIX   Take 1 tablet (75 mg total) by mouth daily with breakfast.      furosemide 20 MG tablet   Commonly known as: LASIX   Take 20 mg by mouth daily.      meloxicam 7.5 MG tablet   Commonly known as: MOBIC   Take 7.5 mg by mouth 2 (two) times daily.      metoprolol 50 MG tablet   Commonly known as: LOPRESSOR  Take 50 mg by mouth 2 (two) times daily.      pravastatin 20 MG tablet   Commonly known as: PRAVACHOL   Take 20 mg by mouth daily.           The results of significant diagnostics from this hospitalization (including imaging, microbiology, ancillary and laboratory) are listed below for reference.    Significant Diagnostic Studies: Ct Head Wo Contrast  04/02/2012  *RADIOLOGY REPORT*  Clinical Data: Code stroke, left-sided facial droop  CT HEAD WITHOUT CONTRAST  Technique:  Contiguous axial images were obtained from the base of the skull through the vertex without contrast.  Comparison: None.  Findings: Prominent bilateral perivascular spaces are incidentally noted image 10.  Right frontal encephalomalacia is identified. No acute hemorrhage, acute infarction, or mass lesion is seen.  No midline shift.  Mild ethmoid mucoperiosteal thickening left maxillary sinus  wall sclerosis.  No skull fracture.  IMPRESSION: No acute intracranial finding.  Remote right frontal encephalomalacia. Critical Value/emergent results were called by telephone at the time of interpretation on 04/03/2011 at 6:20 p.m. to Dr. Thad Ranger, who verbally acknowledged these results.   Original Report Authenticated By: Christiana Pellant, M.D.    Mr Brain Wo Contrast  04/03/2012  *RADIOLOGY REPORT*  Clinical Data:  61 year old male with acute onset left upper extremity weakness, numbness and left facial droop.  Comparison: Head CT 04/02/2012.  MRI HEAD WITHOUT CONTRAST  Technique: Multiplanar, multiecho pulse sequences of the brain and surrounding structures were obtained according to standard protocol without intravenous contrast.  Findings: There are small patchy foci of mild to moderately restricted diffusion in the right motor strip and underlying white matter in the superior frontal gyrus (series 4 images 19 - 21). Only subtle T2 and FLAIR hyperintensity is associated.  No associated mass effect or hemorrhage.  No left hemisphere or posterior fossa diffusion abnormality. Chronic encephalomalacia in the anterior superior right frontal gyrus as seen on the earlier CT.  Major intracranial vascular flow voids are preserved. No midline shift, ventriculomegaly, mass effect, evidence of mass lesion, extra-axial collection or acute intracranial hemorrhage. Cervicomedullary junction and pituitary are within normal limits. Incidental dilated perivascular spaces in the inferior basal ganglia.  Aside from the above findings, gray and white matter signal is within normal limits for age throughout the brain. Negative visualized cervical spine.  Normal bone marrow signal.  Chronic left lamina papyracea fracture.  Mild paranasal sinus mucosal thickening.  Mastoids are clear. Visualized orbit soft tissues are within normal limits.  Negative scalp soft tissues.  IMPRESSION: 1.  Small patchy acute infarcts in the right  motor strip and underlying white matter.  No mass effect or hemorrhage. 2.  See MRA findings below. 3.  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  MRA HEAD WITHOUT CONTRAST  Technique: Angiographic images of the Circle of Willis were obtained using MRA technique without  intravenous contrast.  Findings: Antegrade flow in the posterior circulation codominant distal vertebral arteries.  PICA vessels are within normal limits. Normal vertebrobasilar junction.  Mild motion artifact at the level of the mid basilar.  No basilar artery stenosis.  SCA and left PCA origins are within normal limits.  Fetal type right PCA origin. Left posterior communicating artery diminutive or absent.  Antegrade flow in both ICA siphons.  The right ICA siphon appears mildly nondominant.  Mild motion artifact. Mild ICA siphon irregularity compatible with atherosclerosis.  No ICA stenosis.  Ophthalmic and posterior communicating artery origins are within normal limits.  Dominant  left ICA terminus.  Normal MCA and left ACA origins.  The right ACA A1 segment is absent.  Right ACA supplied from the anterior communicating artery which is within normal limits.  Visualized ACA branches are within normal limits. Mild left MCA M1 segment irregularity.  Left MCA branches are within normal limits.  Right MCA origin is within normal limits.  Incidental right lenticulostriate artery infundibulum.  Right MCA M1 segment is only slightly irregular.  At the right MCA bifurcation a dominant superior posterior M2 branches patent, but there is attenuated flow in the dominant inferior posterior M2 branch (series 9 image 94, series 905 image 12).  IMPRESSION: 1.  Poor flow in the inferior posterior right MCA sylvian division. Right MCA bifurcation and M1 segment are patent. 2.  Mild anterior circulation atherosclerosis otherwise. 3.  Negative posterior circulation.   Original Report Authenticated By: Erskine Speed, M.D.    Mr Mra Head/brain Wo  Cm  04/03/2012  *RADIOLOGY REPORT*  Clinical Data:  61 year old male with acute onset left upper extremity weakness, numbness and left facial droop.  Comparison: Head CT 04/02/2012.  MRI HEAD WITHOUT CONTRAST  Technique: Multiplanar, multiecho pulse sequences of the brain and surrounding structures were obtained according to standard protocol without intravenous contrast.  Findings: There are small patchy foci of mild to moderately restricted diffusion in the right motor strip and underlying white matter in the superior frontal gyrus (series 4 images 19 - 21). Only subtle T2 and FLAIR hyperintensity is associated.  No associated mass effect or hemorrhage.  No left hemisphere or posterior fossa diffusion abnormality. Chronic encephalomalacia in the anterior superior right frontal gyrus as seen on the earlier CT.  Major intracranial vascular flow voids are preserved. No midline shift, ventriculomegaly, mass effect, evidence of mass lesion, extra-axial collection or acute intracranial hemorrhage. Cervicomedullary junction and pituitary are within normal limits. Incidental dilated perivascular spaces in the inferior basal ganglia.  Aside from the above findings, gray and white matter signal is within normal limits for age throughout the brain. Negative visualized cervical spine.  Normal bone marrow signal.  Chronic left lamina papyracea fracture.  Mild paranasal sinus mucosal thickening.  Mastoids are clear. Visualized orbit soft tissues are within normal limits.  Negative scalp soft tissues.  IMPRESSION: 1.  Small patchy acute infarcts in the right motor strip and underlying white matter.  No mass effect or hemorrhage. 2.  See MRA findings below. 3.  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  MRA HEAD WITHOUT CONTRAST  Technique: Angiographic images of the Circle of Willis were obtained using MRA technique without  intravenous contrast.  Findings: Antegrade flow in the posterior circulation codominant  distal vertebral arteries.  PICA vessels are within normal limits. Normal vertebrobasilar junction.  Mild motion artifact at the level of the mid basilar.  No basilar artery stenosis.  SCA and left PCA origins are within normal limits.  Fetal type right PCA origin. Left posterior communicating artery diminutive or absent.  Antegrade flow in both ICA siphons.  The right ICA siphon appears mildly nondominant.  Mild motion artifact. Mild ICA siphon irregularity compatible with atherosclerosis.  No ICA stenosis.  Ophthalmic and posterior communicating artery origins are within normal limits.  Dominant left ICA terminus.  Normal MCA and left ACA origins.  The right ACA A1 segment is absent.  Right ACA supplied from the anterior communicating artery which is within normal limits.  Visualized ACA branches are within normal limits. Mild left MCA M1 segment irregularity.  Left MCA branches are within normal limits.  Right MCA origin is within normal limits.  Incidental right lenticulostriate artery infundibulum.  Right MCA M1 segment is only slightly irregular.  At the right MCA bifurcation a dominant superior posterior M2 branches patent, but there is attenuated flow in the dominant inferior posterior M2 branch (series 9 image 94, series 905 image 12).  IMPRESSION: 1.  Poor flow in the inferior posterior right MCA sylvian division. Right MCA bifurcation and M1 segment are patent. 2.  Mild anterior circulation atherosclerosis otherwise. 3.  Negative posterior circulation.   Original Report Authenticated By: Erskine Speed, M.D.     Microbiology: No results found for this or any previous visit (from the past 240 hour(s)).   Labs: Basic Metabolic Panel:  Lab 04/02/12 4098  NA 135  K 3.4*  CL 95*  CO2 25  GLUCOSE 146*  BUN 12  CREATININE 1.11  CALCIUM 10.2  MG --  PHOS --   Liver Function Tests:  Lab 04/02/12 1803  AST 30  ALT 27  ALKPHOS 51  BILITOT 0.3  PROT 8.3  ALBUMIN 4.3   No results found for  this basename: LIPASE:5,AMYLASE:5 in the last 168 hours No results found for this basename: AMMONIA:5 in the last 168 hours CBC:  Lab 04/02/12 1803  WBC 5.4  NEUTROABS 2.4  HGB 14.5  HCT 43.1  MCV 82.4  PLT 230   Cardiac Enzymes:  Lab 04/03/12 0530 04/02/12 2225 04/02/12 1803  CKTOTAL -- -- --  CKMB -- -- --  CKMBINDEX -- -- --  TROPONINI <0.30 <0.30 <0.30   BNP: BNP (last 3 results) No results found for this basename: PROBNP:3 in the last 8760 hours CBG: No results found for this basename: GLUCAP:5 in the last 168 hours     Signed:  Marinda Elk  [facility] 04/03/2012, 10:22 AM

## 2012-04-03 NOTE — Progress Notes (Signed)
[facility] PROGRESS NOTE  Assessment/Plan: Unspecified transient cerebral ischemia (04/02/2012) - MRI 12.23.2013: Small patchy acute infarcts  - carotid no ICA stenosis. - PT/OT consult pending. - Swallowing eval. No restriction. - Plavix, stains  for home. - LDL < 160 - no events on telemetry. - Appreciate neurology consult - tabacco counseling.  Hypertension () - lasix, Norvasc and metoprolol. - mildly high, will allow some degree of permesive HTN. - titrate BP meds. as an outpatient.  CAD (coronary artery disease) s/p stent 2008 in [place] (04/02/2012) - stains, plavix.  Code Status: full Family Communication: neurology  Disposition Plan: home 1 day.   Consultants:  neurology  Procedures:  MRI 12.23.2013: Small patchy acute infarcts in the right motor strip and  underlying white matter  Antibiotics:  none  HPI/Subjective: No complains  Objective: Filed Vitals:   04/03/12 0000 04/03/12 0200 04/03/12 0400 04/03/12 0600  BP: 158/94 150/86 143/95 141/89  Pulse: 52 54 59 62  Temp: 98.4 F (36.9 C) 97.5 F (36.4 C) 97.9 F (36.6 C) 98.8 F (37.1 C)  TempSrc:    Oral  Resp: 18 18 18 18   Height:      Weight:      SpO2: 99% 99% 100% 100%    Intake/Output Summary (Last 24 hours) at 04/03/12 1005 Last data filed at 04/03/12 0400  Gross per 24 hour  Intake    480 ml  Output    475 ml  Net      5 ml   Filed Weights   04/02/12 2229  Weight: 72.077 kg (158 lb 14.4 oz)    Exam:  General: Alert, awake, oriented x3, in no acute distress.  HEENT: No bruits, no goiter.  Heart: Regular rate and rhythm, without murmurs, rubs, gallops.  Lungs: Good air movement, bilateral air movement.  Abdomen: Soft, nontender, nondistended, positive bowel sounds.  Neuro: muscle strength intact,  light touch decreased in the third digit on the left hand.   Data Reviewed: Basic Metabolic Panel:  Lab 04/02/12 7829  NA 135  K 3.4*  CL 95*  CO2 25  GLUCOSE 146*   BUN 12  CREATININE 1.11  CALCIUM 10.2  MG --  PHOS --   Liver Function Tests:  Lab 04/02/12 1803  AST 30  ALT 27  ALKPHOS 51  BILITOT 0.3  PROT 8.3  ALBUMIN 4.3   No results found for this basename: LIPASE:5,AMYLASE:5 in the last 168 hours No results found for this basename: AMMONIA:5 in the last 168 hours CBC:  Lab 04/02/12 1803  WBC 5.4  NEUTROABS 2.4  HGB 14.5  HCT 43.1  MCV 82.4  PLT 230   Cardiac Enzymes:  Lab 04/03/12 0530 04/02/12 2225 04/02/12 1803  CKTOTAL -- -- --  CKMB -- -- --  CKMBINDEX -- -- --  TROPONINI <0.30 <0.30 <0.30   BNP (last 3 results) No results found for this basename: PROBNP:3 in the last 8760 hours CBG: No results found for this basename: GLUCAP:5 in the last 168 hours  No results found for this or any previous visit (from the past 240 hour(s)).   Studies: Ct Head Wo Contrast  04/02/2012  *RADIOLOGY REPORT*  Clinical Data: Code stroke, left-sided facial droop  CT HEAD WITHOUT CONTRAST  Technique:  Contiguous axial images were obtained from the base of the skull through the vertex without contrast.  Comparison: None.  Findings: Prominent bilateral perivascular spaces are incidentally noted image 10.  Right frontal encephalomalacia is identified. No acute hemorrhage,  acute infarction, or mass lesion is seen.  No midline shift.  Mild ethmoid mucoperiosteal thickening left maxillary sinus wall sclerosis.  No skull fracture.  IMPRESSION: No acute intracranial finding.  Remote right frontal encephalomalacia. Critical Value/emergent results were called by telephone at the time of interpretation on 04/03/2011 at 6:20 p.m. to Dr. Thad Ranger, who verbally acknowledged these results.   Original Report Authenticated By: Christiana Pellant, M.D.    Mr Brain Wo Contrast  04/03/2012  *RADIOLOGY REPORT*  Clinical Data:  61 year old male with acute onset left upper extremity weakness, numbness and left facial droop.  Comparison: Head CT 04/02/2012.  MRI HEAD  WITHOUT CONTRAST  Technique: Multiplanar, multiecho pulse sequences of the brain and surrounding structures were obtained according to standard protocol without intravenous contrast.  Findings: There are small patchy foci of mild to moderately restricted diffusion in the right motor strip and underlying white matter in the superior frontal gyrus (series 4 images 19 - 21). Only subtle T2 and FLAIR hyperintensity is associated.  No associated mass effect or hemorrhage.  No left hemisphere or posterior fossa diffusion abnormality. Chronic encephalomalacia in the anterior superior right frontal gyrus as seen on the earlier CT.  Major intracranial vascular flow voids are preserved. No midline shift, ventriculomegaly, mass effect, evidence of mass lesion, extra-axial collection or acute intracranial hemorrhage. Cervicomedullary junction and pituitary are within normal limits. Incidental dilated perivascular spaces in the inferior basal ganglia.  Aside from the above findings, gray and white matter signal is within normal limits for age throughout the brain. Negative visualized cervical spine.  Normal bone marrow signal.  Chronic left lamina papyracea fracture.  Mild paranasal sinus mucosal thickening.  Mastoids are clear. Visualized orbit soft tissues are within normal limits.  Negative scalp soft tissues.  IMPRESSION: 1.  Small patchy acute infarcts in the right motor strip and underlying white matter.  No mass effect or hemorrhage. 2.  See MRA findings below. 3.  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  MRA HEAD WITHOUT CONTRAST  Technique: Angiographic images of the Circle of Willis were obtained using MRA technique without  intravenous contrast.  Findings: Antegrade flow in the posterior circulation codominant distal vertebral arteries.  PICA vessels are within normal limits. Normal vertebrobasilar junction.  Mild motion artifact at the level of the mid basilar.  No basilar artery stenosis.  SCA and left  PCA origins are within normal limits.  Fetal type right PCA origin. Left posterior communicating artery diminutive or absent.  Antegrade flow in both ICA siphons.  The right ICA siphon appears mildly nondominant.  Mild motion artifact. Mild ICA siphon irregularity compatible with atherosclerosis.  No ICA stenosis.  Ophthalmic and posterior communicating artery origins are within normal limits.  Dominant left ICA terminus.  Normal MCA and left ACA origins.  The right ACA A1 segment is absent.  Right ACA supplied from the anterior communicating artery which is within normal limits.  Visualized ACA branches are within normal limits. Mild left MCA M1 segment irregularity.  Left MCA branches are within normal limits.  Right MCA origin is within normal limits.  Incidental right lenticulostriate artery infundibulum.  Right MCA M1 segment is only slightly irregular.  At the right MCA bifurcation a dominant superior posterior M2 branches patent, but there is attenuated flow in the dominant inferior posterior M2 branch (series 9 image 94, series 905 image 12).  IMPRESSION: 1.  Poor flow in the inferior posterior right MCA sylvian division. Right MCA bifurcation and M1 segment are  patent. 2.  Mild anterior circulation atherosclerosis otherwise. 3.  Negative posterior circulation.   Original Report Authenticated By: Erskine Speed, M.D.    Mr Mra Head/brain Wo Cm  04/03/2012  *RADIOLOGY REPORT*  Clinical Data:  61 year old male with acute onset left upper extremity weakness, numbness and left facial droop.  Comparison: Head CT 04/02/2012.  MRI HEAD WITHOUT CONTRAST  Technique: Multiplanar, multiecho pulse sequences of the brain and surrounding structures were obtained according to standard protocol without intravenous contrast.  Findings: There are small patchy foci of mild to moderately restricted diffusion in the right motor strip and underlying white matter in the superior frontal gyrus (series 4 images 19 - 21). Only  subtle T2 and FLAIR hyperintensity is associated.  No associated mass effect or hemorrhage.  No left hemisphere or posterior fossa diffusion abnormality. Chronic encephalomalacia in the anterior superior right frontal gyrus as seen on the earlier CT.  Major intracranial vascular flow voids are preserved. No midline shift, ventriculomegaly, mass effect, evidence of mass lesion, extra-axial collection or acute intracranial hemorrhage. Cervicomedullary junction and pituitary are within normal limits. Incidental dilated perivascular spaces in the inferior basal ganglia.  Aside from the above findings, gray and white matter signal is within normal limits for age throughout the brain. Negative visualized cervical spine.  Normal bone marrow signal.  Chronic left lamina papyracea fracture.  Mild paranasal sinus mucosal thickening.  Mastoids are clear. Visualized orbit soft tissues are within normal limits.  Negative scalp soft tissues.  IMPRESSION: 1.  Small patchy acute infarcts in the right motor strip and underlying white matter.  No mass effect or hemorrhage. 2.  See MRA findings below. 3.  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  MRA HEAD WITHOUT CONTRAST  Technique: Angiographic images of the Circle of Willis were obtained using MRA technique without  intravenous contrast.  Findings: Antegrade flow in the posterior circulation codominant distal vertebral arteries.  PICA vessels are within normal limits. Normal vertebrobasilar junction.  Mild motion artifact at the level of the mid basilar.  No basilar artery stenosis.  SCA and left PCA origins are within normal limits.  Fetal type right PCA origin. Left posterior communicating artery diminutive or absent.  Antegrade flow in both ICA siphons.  The right ICA siphon appears mildly nondominant.  Mild motion artifact. Mild ICA siphon irregularity compatible with atherosclerosis.  No ICA stenosis.  Ophthalmic and posterior communicating artery origins are  within normal limits.  Dominant left ICA terminus.  Normal MCA and left ACA origins.  The right ACA A1 segment is absent.  Right ACA supplied from the anterior communicating artery which is within normal limits.  Visualized ACA branches are within normal limits. Mild left MCA M1 segment irregularity.  Left MCA branches are within normal limits.  Right MCA origin is within normal limits.  Incidental right lenticulostriate artery infundibulum.  Right MCA M1 segment is only slightly irregular.  At the right MCA bifurcation a dominant superior posterior M2 branches patent, but there is attenuated flow in the dominant inferior posterior M2 branch (series 9 image 94, series 905 image 12).  IMPRESSION: 1.  Poor flow in the inferior posterior right MCA sylvian division. Right MCA bifurcation and M1 segment are patent. 2.  Mild anterior circulation atherosclerosis otherwise. 3.  Negative posterior circulation.   Original Report Authenticated By: Erskine Speed, M.D.     Scheduled Meds:   . amLODipine  10 mg Oral Daily  . clopidogrel  75 mg Oral Q  breakfast  . furosemide  20 mg Oral Daily  . influenza  inactive virus vaccine  0.5 mL Intramuscular Tomorrow-1000  . meloxicam  7.5 mg Oral BID  . metoprolol  50 mg Oral BID  . simvastatin  10 mg Oral q1800   Continuous Infusions:    Marinda Elk  [facility] Pager [phone]. If 8PM-8AM, please contact night-coverage at [email or web address], password [facility] 04/03/2012, 10:05 AM  LOS: 1 day

## 2012-04-03 NOTE — Plan of Care (Signed)
Problem: Phase I Progression Outcomes Goal: Strict NPO til swallow screen done Outcome: Completed/Met Date Met:  04/03/12 Pt kept NPO prior to stroke swallow screen in which he passed. Goal: NIH Stroke Scale-NIHSS done on admission Outcome: Completed/Met Date Met:  04/03/12 Score of a 4 by neurologist in ED.

## 2012-04-03 NOTE — Procedures (Signed)
ELECTROENCEPHALOGRAM REPORT   Patient: Randall Patel       Room #: 6O13 EEG No. ID: [id number] Age: 61 y.o.        Sex: male Referring Physician: Onalee Hua Report Date:  04/03/2012        Interpreting Physician: Thana Farr D  History: [name] is an 61 y.o. male with an episode of left sided numbness and weakness evaluated to rule out seizure  Medications:  I have reviewed the patient's current medications. Scheduled:   . amLODipine  10 mg Oral Daily  . clopidogrel  75 mg Oral Q breakfast  . furosemide  20 mg Oral Daily  . influenza  inactive virus vaccine  0.5 mL Intramuscular Tomorrow-1000  . meloxicam  7.5 mg Oral BID  . metoprolol  50 mg Oral BID  . simvastatin  10 mg Oral q1800    Conditions of Recording:  This is a 16 channel EEG carried out with the patient in the awake and drowsy states.  Description:  The waking background activity consists of a low voltage, symmetrical, fairly well organized, 10-11 Hz alpha activity, seen from the parieto-occipital and posterior temporal regions.  This is poorly sustained.  Low voltage fast activity, poorly organized, is seen anteriorly and is at times superimposed on more posterior regions.  A mixture of theta and alpha rhythms are seen from the central and temporal regions. The patient drowses with slowing to irregular, low voltage theta and beta activity.   Stage II sleep is not obtained. Hyperventilation was not performed.  Intermittent photic stimulation was performed and elicits a symmetrical driving response but fails to elicit any abnormalities.  IMPRESSION: Normal awake electroencephalogram with activation procedures. There are no focal lateralizing or epileptiform features.  Comment:  An EEG with the patient sleep deprived to elicit drowse and light sleep may be desirable to further elicit a possible seizure disorder.     Thana Farr, MD [facility] [phone] 04/03/2012, 10:28 AM

## 2012-04-03 NOTE — Plan of Care (Signed)
Problem: Consults Goal: Stroke - Ischemic/TIA Patient Education See Patient Education Module for education specifics. Outcome: Completed/Met Date Met:  04/03/12 [place] book given to patient. Goal: Skin Care Protocol Initiated - if Braden Score 18 or less If consults are not indicated, leave blank or document N/A Outcome: Not Applicable Date Met:  04/03/12 Pt's skin is WNL but he was encouraged to turn q2hrs.

## 2012-04-11 ENCOUNTER — Encounter (HOSPITAL_COMMUNITY)
Admission: EM | Disposition: A | Discharge status: Home / Self Care | Payer: Self-pay | Source: Home / Self Care | Attending: Cardiovascular Disease

## 2012-04-11 ENCOUNTER — Inpatient Hospital Stay (HOSPITAL_COMMUNITY)
Admission: EM | Admit: 2012-04-11 | Discharge: 2012-04-14 | DRG: 247 | Disposition: A | Discharge status: Home / Self Care | Payer: Medicare Other | Attending: Cardiovascular Disease | Admitting: Cardiovascular Disease

## 2012-04-11 ENCOUNTER — Encounter (HOSPITAL_COMMUNITY): Payer: Self-pay | Admitting: *Deleted

## 2012-04-11 ENCOUNTER — Emergency Department (HOSPITAL_COMMUNITY): Payer: Medicare Other

## 2012-04-11 DIAGNOSIS — I2510 Atherosclerotic heart disease of native coronary artery without angina pectoris: Secondary | ICD-10-CM | POA: Diagnosis not present

## 2012-04-11 DIAGNOSIS — Z8673 Personal history of transient ischemic attack (TIA), and cerebral infarction without residual deficits: Secondary | ICD-10-CM

## 2012-04-11 DIAGNOSIS — Z79899 Other long term (current) drug therapy: Secondary | ICD-10-CM

## 2012-04-11 DIAGNOSIS — I200 Unstable angina: Secondary | ICD-10-CM

## 2012-04-11 DIAGNOSIS — I252 Old myocardial infarction: Secondary | ICD-10-CM

## 2012-04-11 DIAGNOSIS — R51 Headache: Secondary | ICD-10-CM | POA: Diagnosis not present

## 2012-04-11 DIAGNOSIS — R5381 Other malaise: Secondary | ICD-10-CM | POA: Diagnosis not present

## 2012-04-11 DIAGNOSIS — I498 Other specified cardiac arrhythmias: Secondary | ICD-10-CM | POA: Diagnosis present

## 2012-04-11 DIAGNOSIS — Z9861 Coronary angioplasty status: Secondary | ICD-10-CM

## 2012-04-11 DIAGNOSIS — F1010 Alcohol abuse, uncomplicated: Secondary | ICD-10-CM | POA: Diagnosis present

## 2012-04-11 DIAGNOSIS — Z7902 Long term (current) use of antithrombotics/antiplatelets: Secondary | ICD-10-CM

## 2012-04-11 DIAGNOSIS — I249 Acute ischemic heart disease, unspecified: Secondary | ICD-10-CM

## 2012-04-11 DIAGNOSIS — I255 Ischemic cardiomyopathy: Secondary | ICD-10-CM | POA: Diagnosis present

## 2012-04-11 DIAGNOSIS — I10 Essential (primary) hypertension: Secondary | ICD-10-CM | POA: Diagnosis present

## 2012-04-11 DIAGNOSIS — E785 Hyperlipidemia, unspecified: Secondary | ICD-10-CM | POA: Diagnosis present

## 2012-04-11 DIAGNOSIS — R5383 Other fatigue: Secondary | ICD-10-CM | POA: Diagnosis not present

## 2012-04-11 DIAGNOSIS — I2589 Other forms of chronic ischemic heart disease: Secondary | ICD-10-CM | POA: Diagnosis present

## 2012-04-11 DIAGNOSIS — I214 Non-ST elevation (NSTEMI) myocardial infarction: Principal | ICD-10-CM

## 2012-04-11 DIAGNOSIS — I6340 Cerebral infarction due to embolism of unspecified cerebral artery: Secondary | ICD-10-CM

## 2012-04-11 DIAGNOSIS — Z8249 Family history of ischemic heart disease and other diseases of the circulatory system: Secondary | ICD-10-CM

## 2012-04-11 DIAGNOSIS — Z955 Presence of coronary angioplasty implant and graft: Secondary | ICD-10-CM

## 2012-04-11 DIAGNOSIS — F17200 Nicotine dependence, unspecified, uncomplicated: Secondary | ICD-10-CM | POA: Diagnosis present

## 2012-04-11 DIAGNOSIS — I70219 Atherosclerosis of native arteries of extremities with intermittent claudication, unspecified extremity: Secondary | ICD-10-CM | POA: Diagnosis present

## 2012-04-11 DIAGNOSIS — Z7982 Long term (current) use of aspirin: Secondary | ICD-10-CM

## 2012-04-11 HISTORY — PX: PERCUTANEOUS CORONARY INTERVENTION-BALLOON ONLY: SHX6014

## 2012-04-11 HISTORY — PX: LEFT HEART CATHETERIZATION WITH CORONARY ANGIOGRAM: SHX5451

## 2012-04-11 HISTORY — DX: Cerebral infarction, unspecified: I63.9

## 2012-04-11 HISTORY — DX: Hyperlipidemia, unspecified: E78.5

## 2012-04-11 HISTORY — PX: PERCUTANEOUS CORONARY STENT INTERVENTION (PCI-S): SHX5485

## 2012-04-11 HISTORY — DX: Tobacco use: Z72.0

## 2012-04-11 HISTORY — DX: Alcohol abuse, uncomplicated: F10.10

## 2012-04-11 HISTORY — DX: Peripheral vascular disease, unspecified: I73.9

## 2012-04-11 HISTORY — DX: Ischemic cardiomyopathy: I25.5

## 2012-04-11 LAB — APTT: aPTT: 29 s (ref 24–37)

## 2012-04-11 LAB — COMPREHENSIVE METABOLIC PANEL WITH GFR
ALT: 32 U/L (ref 0–53)
AST: 106 U/L — ABNORMAL HIGH (ref 0–37)
Albumin: 4.3 g/dL (ref 3.5–5.2)
CO2: 27 meq/L (ref 19–32)
Chloride: 89 meq/L — ABNORMAL LOW (ref 96–112)
GFR calc non Af Amer: 70 mL/min — ABNORMAL LOW (ref >90)
Sodium: 134 meq/L — ABNORMAL LOW (ref 135–145)
Total Bilirubin: 0.6 mg/dL (ref 0.3–1.2)

## 2012-04-11 LAB — PROTIME-INR
INR: 0.96 (ref 0.00–1.49)
Prothrombin Time: 12.7 s (ref 11.6–15.2)

## 2012-04-11 LAB — TROPONIN I: Troponin I: 5.32 ng/mL (ref <0.30)

## 2012-04-11 LAB — CBC WITH DIFFERENTIAL/PLATELET
Basophils Absolute: 0 10*3/uL (ref 0.0–0.1)
Basophils Relative: 0 % (ref 0–1)
Eosinophils Absolute: 0 10*3/uL (ref 0.0–0.7)
Eosinophils Relative: 0 % (ref 0–5)
HCT: 45.7 % (ref 39.0–52.0)
Hemoglobin: 15.3 g/dL (ref 13.0–17.0)
Lymphocytes Relative: 20 % (ref 12–46)
Lymphs Abs: 1.8 10*3/uL (ref 0.7–4.0)
MCH: 27.5 pg (ref 26.0–34.0)
MCHC: 33.5 g/dL (ref 30.0–36.0)
MCV: 82 fL (ref 78.0–100.0)
Monocytes Absolute: 0.8 10*3/uL (ref 0.1–1.0)
Monocytes Relative: 9 % (ref 3–12)
Neutro Abs: 6.3 10*3/uL (ref 1.7–7.7)
Neutrophils Relative %: 71 % (ref 43–77)
Platelets: 253 10*3/uL (ref 150–400)
RBC: 5.57 MIL/uL (ref 4.22–5.81)
RDW: 14.1 % (ref 11.5–15.5)
WBC: 8.9 10*3/uL (ref 4.0–10.5)

## 2012-04-11 LAB — COMPREHENSIVE METABOLIC PANEL
Alkaline Phosphatase: 42 U/L (ref 39–117)
BUN: 18 mg/dL (ref 6–23)
Calcium: 10.6 mg/dL — ABNORMAL HIGH (ref 8.4–10.5)
Creatinine, Ser: 1.12 mg/dL (ref 0.50–1.35)
GFR calc Af Amer: 81 mL/min — ABNORMAL LOW (ref >90)
Glucose, Bld: 104 mg/dL — ABNORMAL HIGH (ref 70–99)
Potassium: 3 mEq/L — ABNORMAL LOW (ref 3.5–5.1)
Total Protein: 8.6 g/dL — ABNORMAL HIGH (ref 6.0–8.3)

## 2012-04-11 LAB — POCT I-STAT TROPONIN I: Troponin i, poc: 5.01 ng/mL (ref 0.00–0.08)

## 2012-04-11 SURGERY — LEFT HEART CATHETERIZATION WITH CORONARY ANGIOGRAM
Anesthesia: LOCAL

## 2012-04-11 MED ORDER — POTASSIUM CHLORIDE 20 MEQ PO PACK: 40.0000 meq | PACK | Freq: Once | ORAL | Status: DC

## 2012-04-11 MED ORDER — FENTANYL CITRATE 0.05 MG/ML IJ SOLN
INTRAMUSCULAR | Status: AC
  Filled 2012-04-11: qty 2

## 2012-04-11 MED ORDER — CLOPIDOGREL BISULFATE 300 MG PO TABS
ORAL_TABLET | ORAL | Status: AC
  Filled 2012-04-11: qty 2

## 2012-04-11 MED ORDER — ASPIRIN 81 MG PO CHEW
162.0000 mg | CHEWABLE_TABLET | Freq: Once | ORAL | Status: AC
  Administered 2012-04-11: 162 mg via ORAL
  Filled 2012-04-11: qty 2

## 2012-04-11 MED ORDER — HEPARIN BOLUS VIA INFUSION: 4000.0000 [IU] | Freq: Once | INTRAVENOUS | Status: DC

## 2012-04-11 MED ORDER — HEPARIN SODIUM (PORCINE) 5000 UNIT/ML IJ SOLN: 60.0000 [IU]/kg | Freq: Once | INTRAMUSCULAR | Status: DC

## 2012-04-11 MED ORDER — NITROGLYCERIN 0.2 MG/ML ON CALL CATH LAB
INTRAVENOUS | Status: AC
  Filled 2012-04-11: qty 1

## 2012-04-11 MED ORDER — HEPARIN (PORCINE) IN NACL 100-0.45 UNIT/ML-% IJ SOLN
900.0000 [IU]/h | INTRAMUSCULAR | Status: DC
  Filled 2012-04-11: qty 250

## 2012-04-11 MED ORDER — FAMOTIDINE IN NACL 20-0.9 MG/50ML-% IV SOLN
INTRAVENOUS | Status: AC
  Filled 2012-04-11: qty 50

## 2012-04-11 MED ORDER — MIDAZOLAM HCL 2 MG/2ML IJ SOLN
INTRAMUSCULAR | Status: AC
  Filled 2012-04-11: qty 2

## 2012-04-11 MED ORDER — LIDOCAINE HCL (PF) 1 % IJ SOLN
INTRAMUSCULAR | Status: AC
  Filled 2012-04-11: qty 30

## 2012-04-11 MED ORDER — POTASSIUM CHLORIDE IN NACL 20-0.9 MEQ/L-% IV SOLN
Freq: Once | INTRAVENOUS | Status: AC
  Administered 2012-04-11: 22:00:00 via INTRAVENOUS
  Filled 2012-04-11: qty 1000

## 2012-04-11 MED ORDER — HEPARIN (PORCINE) IN NACL 2-0.9 UNIT/ML-% IJ SOLN
INTRAMUSCULAR | Status: AC
  Filled 2012-04-11: qty 1500

## 2012-04-11 MED ORDER — POTASSIUM CHLORIDE CRYS ER 20 MEQ PO TBCR: 40.0000 meq | EXTENDED_RELEASE_TABLET | Freq: Once | ORAL | Status: DC

## 2012-04-11 MED ORDER — BIVALIRUDIN 250 MG IV SOLR
INTRAVENOUS | Status: AC
  Filled 2012-04-11: qty 250

## 2012-04-11 MED ORDER — POTASSIUM CHLORIDE CRYS ER 20 MEQ PO TBCR
20.0000 meq | EXTENDED_RELEASE_TABLET | Freq: Once | ORAL | Status: AC
  Administered 2012-04-11: 40 meq via ORAL
  Filled 2012-04-11: qty 2

## 2012-04-11 NOTE — ED Notes (Signed)
Pt placed on cardiac monitor, IV started, denies any chest pain at this time, states he feels weak.

## 2012-04-11 NOTE — CV Procedure (Addendum)
Cardiac Catheterization Operative Report  Randall Patel [id number] 12/31/201311:54 PM Randall Junes, MD  Procedure Performed:  1. Left Heart Catheterization 2. Selective Coronary Angiography 3. Left ventricular angiogram 4. PTCA/DES x 1 distal RCA 5. PTCA with balloon angioplasty of the posterolateral branch 6. Angioseal right femoral artery  Operator: Verne Carrow, MD  Indication:  61 yo AAM with history of CAD with prior posterolateral stent in 2008 presents with c/o weakness, mild chest pressure. EKG with junctional bradycardia. First troponin 5.0.                                    Procedure Details: The risks, benefits, complications, treatment options, and expected outcomes were discussed with the patient. Emergency consent obtained. The patient gave verbal consent as well. The patient was brought to the cath lab from the ED urgently. The after hours call team was here for another case.  The patient was further sedated with Versed and Fentanyl. The right groin was prepped and draped in the usual manner. Using the modified Seldinger access technique, a 6 French sheath was placed in the right femoral artery. Standard diagnostic catheters were used to perform selective coronary angiography. The patient was found to have total occlusion of the distal RCA. This was felt to be his culprit stenosis. A JR4 guide was used to engage the RCA. The patient was given a bolus of Angiomax and a drip was started. He was given an additional 600 mg po Plavix x 1. When the ACT was greater than 200, I passed a Whisper wire into the distal RCA. A 2.5 x 12 mm balloon was inflated x 3. I then deployed a 2.5 x 20 mm Promus Premier DES in the distal RCA ending the stent just before the bifurcation. This was post-dilated with a 2.75 x 15 mm [place] balloon. After the vessel was opened, it was evident that the posterolateral branch had restenosis in the long stented segment. A 2.0 x 20 mm balloon was  inflated in the posterolateral branch x 3. There was an adequate result in this small caliber vessel. The guide and wire were removed. A pigtail catheter was used to perform a left ventricular angiogram. An Angioseal femoral artery closure device was deployed in the right femoral artery.   There were no immediate complications. The patient was taken to the recovery area in stable condition.   Hemodynamic Findings: Central aortic pressure: 113/75 Left ventricular pressure: 106/5/9  Angiographic Findings:  Left main: Ostial 30% stenosis with heavy calcification.  Left Anterior Descending Artery: Large caliber vessel that courses to the apex. The proximal vessel has 30% stenosis. The mid vessel has a 60% stenosis at the takeoff of a septal perforating branch. Moderate caliber diagonal branch with mild plaque disease.   Circumflex Artery: Moderate caliber vessel. The proximal vessel has diffuse 30% stenosis. The first 2 obtuse marginal branches are very small. The third obtuse marginal branch is a small bifurcating vessel with diffuse 90% stenosis involving both branches. (1.75 mm vessel)  Right Coronary Artery: Large, dominant artery with 50% mid stenosis and 100% distal occlusion.   Left Ventricular Angiogram: LVEF=30-35% with akinesis of the inferior wall.   Impression: 1. NSTEMI 2. Triple vessel CAD with occluded RCA, culprit vessel. 3. Successful PTCA/DES x 1 distal RCA 4. Successful PTCA with balloon angioplasty only of the posterolateral branch.  5. Segmental LV systolic dysfunction  Recommendations: Will continue ASA and  Plavix for at least one year but longer if he tolerates. Will start statin. Will hold beta blocker secondary to bradycardia. He is intolerant to Ace-inhibitors. Will run Angiomax for two hours then stop. Plavix was used. (Effient contraindicated with prior CVA). He has been on long term Plavix. Will check P2Y12 in am. If PRU is greater than 230, will stop Plavix and  load with Brilinta.        Complications:  None. The patient tolerated the procedure well.

## 2012-04-11 NOTE — ED Notes (Signed)
Patient transported to the Cath Lab.

## 2012-04-11 NOTE — ED Provider Notes (Addendum)
History     CSN: [id number]  Arrival date & time 04/11/12  2004   First MD Initiated Contact with Patient 04/11/12 2116      Chief Complaint  Patient presents with  . Fatigue    (Consider location/radiation/quality/duration/timing/severity/associated sxs/prior treatment) HPI Comments: Patient presents to do to feeling excessively fatigued and tired and sleepy since about 9 AM this morning when he arose. He denies fever chills, URI symptoms, chest pain, abdominal pain. He was recently admitted for a stroke. He was taken off aspirin and instead put on Plavix. He reports he does have a history of a heart attack in 2008 and was treated at his previous location. He has a significant history of high blood pressure and high cholesterol. He did take an Alka-Seltzer cold earlier today which she takes when he feels "hung over" in order to help rest. He reports that he used to be a heavy drinker but does not drink excessively any longer. He recently quit smoking last week.  The history is provided by the patient and medical records.    Past Medical History  Diagnosis Date  . Hypertension   . MI (myocardial infarction)   . Stroke     Past Surgical History  Procedure Date  . Hernia repair     No family history on file.  History  Substance Use Topics  . Smoking status: Current Every Day Smoker -- 1.0 packs/day for 40 years  . Smokeless tobacco: Not on file  . Alcohol Use: Yes      Review of Systems  Constitutional: Positive for fatigue.  HENT: Negative for neck pain.   Respiratory: Negative for chest tightness and shortness of breath.   Cardiovascular: Negative for chest pain.  Gastrointestinal: Negative for nausea, vomiting, abdominal pain, diarrhea and constipation.  Neurological: Positive for weakness. Negative for dizziness.  All other systems reviewed and are negative.    Allergies  Lisinopril  Home Medications   Current Outpatient Rx  Name  Route  Sig  Dispense   Refill  . AMLODIPINE BESYLATE 10 MG PO TABS   Oral   Take 10 mg by mouth daily.         [name] CLOPIDOGREL BISULFATE 75 MG PO TABS   Oral   Take 1 tablet (75 mg total) by mouth daily with breakfast.   30 tablet   3   . FUROSEMIDE 20 MG PO TABS   Oral   Take 20 mg by mouth daily.         . MELOXICAM 7.5 MG PO TABS   Oral   Take 7.5 mg by mouth 2 (two) times daily.         [name] METOPROLOL TARTRATE 50 MG PO TABS   Oral   Take 50 mg by mouth 2 (two) times daily.         [name] PRAVASTATIN SODIUM 20 MG PO TABS   Oral   Take 20 mg by mouth daily.           BP 132/90  Pulse 58  Temp 98 F (36.7 C) (Oral)  Resp 17  SpO2 100%  Physical Exam  Nursing note and vitals reviewed. Constitutional: He appears well-developed and well-nourished.  HENT:  Head: Normocephalic and atraumatic.  Eyes: Pupils are equal, round, and reactive to light. No scleral icterus.  Neck: Normal range of motion. Neck supple.  Cardiovascular: Normal rate and regular rhythm.   No murmur heard. Pulmonary/Chest: Effort normal.  Abdominal: Soft.  Neurological: He  is alert. Coordination normal.  Skin: Skin is warm.  Psychiatric: Thought content normal. His mood appears not anxious. His speech is delayed. He is slowed. He is not agitated, not aggressive, is not hyperactive, not withdrawn and not combative. He does not exhibit a depressed mood. He is attentive.    ED Course  Procedures (including critical care time)   CRITICAL CARE Performed by: Lear Ng.   Total critical care time: 30 min  Critical care time was exclusive of separately billable procedures and treating other patients.  Critical care was necessary to treat or prevent imminent or life-threatening deterioration.  Critical care was time spent personally by me on the following activities: development of treatment plan with patient and/or surrogate as well as nursing, discussions with consultants, evaluation of patient's response to  treatment, examination of patient, obtaining history from patient or surrogate, ordering and performing treatments and interventions, ordering and review of laboratory studies, ordering and review of radiographic studies, pulse oximetry and re-evaluation of patient's condition.   Labs Reviewed  COMPREHENSIVE METABOLIC PANEL - Abnormal; Notable for the following:    Sodium 134 (*)     Potassium 3.0 (*)     Chloride 89 (*)     Glucose, Bld 104 (*)     Calcium 10.6 (*)     Total Protein 8.6 (*)     AST 106 (*)     GFR calc non Af Amer 70 (*)     GFR calc Af Amer 81 (*)     All other components within normal limits  POCT I-STAT TROPONIN I - Abnormal; Notable for the following:    Troponin i, poc 5.01 (*)     All other components within normal limits  CBC WITH DIFFERENTIAL  TROPONIN I  APTT  PROTIME-INR   Dg Chest Port 1 View  04/11/2012  *RADIOLOGY REPORT*  Clinical Data: Dull headache and weakness.  History of smoking.  PORTABLE CHEST - 1 VIEW  Comparison: Chest radiograph performed and 05/2011  Findings: The lungs are well-aerated and clear.  There is no evidence of focal opacification, pleural effusion or pneumothorax.  The cardiomediastinal silhouette is borderline normal in size.  No acute osseous abnormalities are seen.  IMPRESSION: No acute cardiopulmonary process seen.   Original Report Authenticated By: Tonia Ghent, M.D.    I reviewed the above PCXR myself  1. Acute coronary syndrome     Room air saturation is 100% and I interpret this to be normal.  EKG performed at time 20:19, shows sinus rhythm with shortened PR interval and inferior T wave inversions. Borderline Q waves are seen suggestive of prior inferior infarct. Voltage criteria for LVH is seen.  Diffuse inverted T wave inversions are no longer seeing compared to EKG from 04/04/2012.  EKG performed at time 21:21 shows a junctional rhythm with no P waves seen at a rate of 58 with widened QRS. This is changed from  previous EKG performed earlier today.  10:02 PM Electrolyte analysis shows that he is mildly hypokalemic with a potassium of 3.0. Given his arrhythmia, will replaced both orally and by IV. The [facility] cardiology, Dr. Shirlee Latch is to see the patient and admit.  MDM   Patient with nonspecific symptoms of fatigue and generalized weakness. No obvious focal deficits are seen however he does seem to have a very minimal left-sided facial droop on examination currently. This may be consistent with his recent diagnosis of stroke. No obvious ischemic changes on EKG, I presume the prior  history of MI would explain his inferior Q waves and inverted T waves. However he does have episodic rhythm change between sinus rhythm and a junctional escape rhythm. His initial 20 care troponin is 5 which is suggestive of acute coronary syndrome. His blood pressure currently is 128/93 and he has no complaints of chest pain or shortness of breath. During my examination, my colleague had spoken to the [facility] cardiologist who will see the patient here in the emergency department. The patient has already taken his Plavix earlier today.        Gavin Pound. Oletta Lamas, MD 04/11/12 2203  Gavin Pound. Oletta Lamas, MD 04/11/12 2213

## 2012-04-11 NOTE — ED Notes (Signed)
Pt taken back to room and placed on monitor, rhythm changes on monitor noted to be different from previous EKG, EKG repeated, shown to Dr. Fonnie Jarvis. Placed on [place] O2 2L. VSS. Denies CP or SOB, "still just generally weak and fatigued". Recently stopped ASA with last admission and started on plavix. "Has taken his plavix today".

## 2012-04-11 NOTE — ED Notes (Addendum)
Report given to KF, RN, Dr. Oletta Lamas at [facility].

## 2012-04-11 NOTE — ED Notes (Addendum)
C/o general weakness/ fatigue, sleepy, no energy, can't sleep, onset 0900 this am, morning meds did not help, also reports dull forehead discomfort "like a hangover, not a HA". ("Here last Sunday 12/22 and dx'd with stroke, had L arm hand numbness, all sx resolved"). Alert, NAD, calm, interactive, MAEx4, speech clear, grip strength and coordination are equal and strong, no droop or drift, PERRL 2mm, no visual field deficits, gaze normal/ intact, (denies: nausea, pain, sob, dizzy, fever, diarrhea, cough, congestion, cold sx), vomited x1 after taking alka seltzer ~ 1230, "thought it would make me feel better", denies smoking or drinking ETOH since 12/25. No pedal edema noted. "just feel real sleepy". meds & d/c summary and recent lab sreviewed

## 2012-04-11 NOTE — Progress Notes (Signed)
ANTICOAGULATION CONSULT NOTE - Initial Consult  Pharmacy Consult for Heparin Indication: chest pain/ACS  Allergies  Allergen Reactions  . Lisinopril Swelling    Patient Measurements: Height: 5' 4.96" (165 cm) Weight: 158 lb 11.7 oz (72 kg) IBW/kg (Calculated) : 61.41   Vital Signs: Temp: 98 F (36.7 C) (12/31 2120) Temp src: Oral (12/31 2120) BP: 132/90 mmHg (12/31 2120) Pulse Rate: 58  (12/31 2120)  Labs:  Basename 04/11/12 2136 04/11/12 2029  HGB -- 15.3  HCT -- 45.7  PLT -- 253  APTT 29 --  LABPROT 12.7 --  INR 0.96 --  HEPARINUNFRC -- --  CREATININE -- 1.12  CKTOTAL -- --  CKMB -- --  TROPONINI -- --    Estimated Creatinine Clearance: 61.7 ml/min (by C-G formula based on Cr of 1.12).   Medical History: Past Medical History  Diagnosis Date  . Hypertension   . MI (myocardial infarction)   . Stroke     Medications:  Norvasc  Plavix  Lasix  Mobic  Lopressor  Pravachol    Assessment: 61 yo male with ACS for Heparin  Goal of Therapy:  Heparin level 0.3-0.7 units/ml Monitor platelets by anticoagulation protocol: Yes   Plan:  Heparin 4000 units IV bolus, then 900 units/hr Check heparin level in 6 hours.  Gearldine Looney, Gary Fleet 04/11/2012,10:18 PM

## 2012-04-11 NOTE — H&P (Addendum)
Physician History and Physical    Randall Patel MRN: [id number] DOB/AGE: 1951-02-14 61 y.o. Admit date: 04/11/2012  Primary Care Physician: Clyda Greener Primary Cardiologist: None  HPI:  61 yo with history of CAD s/p MI in 2008 and CVA in 12/13 presents with NSTEMI.  Patient was admitted earlier this month with left arm weakness/numbness and left facial droop.  He was found to have a CVA.  He was started on Plavix and ASA was stopped.  He had an MI in 2008 in [place] with PCI.  He has not had a cardiac cath since then and is not followed by a cardiologist.  Echo in 12/13 showed EF 45-50% with diffuse hypokinesis.  Yesterday, he had central chest pain that was relatively mild for about 30 minutes.  Today, he woke up feeling like he had a hangover. He was weak all over and nauseated.  He vomited once.  All day long, he felt profoundly weak and mildly nauseated.  He did not have chest pain. This sensation did not improve and he was unable to get to sleep.  Therefore, he came to the ER.  In the ER, he was noted to have troponin of 5.  ECG showed NSR with old inferior MI, similar to prior though inferior and anterolateral T wave inversions were more prominent.  He was noted on telemetry to go in and out of an accelerated junctional rhythm in the 50s with RBBB/LAFB morphology.  Patient additionally reports claudication-type symptoms in his left leg.   PMH: 1. HTN 2. CVA: 12/13 with left arm weakness/numbness and left facial droop.  3. CAD: MI in 2008 with PCI (not sure which vessel).  NSTEMI 12/13.  4. Smoker: quit 04/06/12 5. ETOH abuse: quit 04/06/12 6. Claudication  Review of systems complete and found to be negative unless listed above   History   Social History  . Marital Status: Single, lives with sister    Spouse Name: N/A    Number of Children: N/A  . Years of Education: N/A   Occupational History  . Not on file.   Social History Main Topics  . Smoking status: Smoked 1 ppd until  04/06/12  . Smokeless tobacco: Not on file  . Alcohol Use: Heavy prior but quit 04/06/12  . Drug Use: No  . Sexually Active:    Other Topics Concern  . Not on file   Social History Narrative  . No narrative on file    FH: CAD  Current Facility-Administered Medications  Medication Dose Route Frequency Provider Last Rate Last Dose  . heparin ADULT infusion 100 units/mL (25000 units/250 mL)  900 Units/hr Intravenous Continuous Gavin Pound. Ghim, MD      . heparin bolus via infusion 4,000 Units  4,000 Units Intravenous Once Gavin Pound. Ghim, MD      . potassium chloride (KLOR-CON) packet 40 mEq  40 mEq Oral Once Laurey Morale, MD       Current Outpatient Prescriptions  Medication Sig Dispense Refill  . amLODipine (NORVASC) 10 MG tablet Take 10 mg by mouth daily.      . clopidogrel (PLAVIX) 75 MG tablet Take 1 tablet (75 mg total) by mouth daily with breakfast.  30 tablet  3  . furosemide (LASIX) 20 MG tablet Take 20 mg by mouth daily.      . meloxicam (MOBIC) 7.5 MG tablet Take 7.5 mg by mouth 2 (two) times daily.      . metoprolol (LOPRESSOR) 50 MG tablet Take  50 mg by mouth 2 (two) times daily.      . pravastatin (PRAVACHOL) 20 MG tablet Take 20 mg by mouth daily.         Physical Exam: Blood pressure 132/90, pulse 58, temperature 98 F (36.7 C), temperature source Oral, resp. rate 17, height 5' 4.96" (1.65 m), weight 158 lb 11.7 oz (72 kg), SpO2 100.00%.  General: NAD Neck: No JVD, no thyromegaly or thyroid nodule.  Lungs: Clear to auscultation bilaterally with normal respiratory effort. CV: Nondisplaced PMI.  Heart regular S1/S2, no S3/S4, no murmur.  No peripheral edema.  No carotid bruit.  Normal pedal pulses.  Abdomen: Soft, nontender, no hepatosplenomegaly, no distention.  Skin: Intact without lesions or rashes.  Neurologic: Alert and oriented x 3.  Psych: Normal affect. Extremities: No clubbing or cyanosis.  HEENT: Normal.   Labs:   Lab Results  Component Value  Date   WBC 8.9 04/11/2012   HGB 15.3 04/11/2012   HCT 45.7 04/11/2012   MCV 82.0 04/11/2012   PLT 253 04/11/2012    Lab 04/11/12 2029  NA 134*  K 3.0*  CL 89*  CO2 27  BUN 18  CREATININE 1.12  CALCIUM 10.6*  PROT 8.6*  BILITOT 0.6  ALKPHOS 42  ALT 32  AST 106*  GLUCOSE 104*   TnI 5.01   Lab Results  Component Value Date   CHOL 286* 04/03/2012   CHOL  Value: 290        ATP III CLASSIFICATION:  <200     mg/dL   Desirable  027-253  mg/dL   Borderline High  >=664    mg/dL   High       * 40/34/7425   Lab Results  Component Value Date   HDL 109 04/03/2012   HDL 105 03/11/2009   Lab Results  Component Value Date   LDLCALC 155* 04/03/2012   LDLCALC  Value: 173        Total Cholesterol/HDL:CHD Risk Coronary Heart Disease Risk Table                     Men   Women  1/2 Average Risk   3.4   3.3  Average Risk       5.0   4.4  2 X Average Risk   9.6   7.1  3 X Average Risk  23.4   11.0        Use the calculated Patient Ratio above and the CHD Risk Table to determine the patient's CHD Risk.        ATP III CLASSIFICATION (LDL):  <100     mg/dL   Optimal  956-387  mg/dL   Near or Above                    Optimal  130-159  mg/dL   Borderline  564-332  mg/dL   High  >951     mg/dL   Very High* 88/41/6606   Lab Results  Component Value Date   TRIG 108 04/03/2012   TRIG 62 03/11/2009   Lab Results  Component Value Date   CHOLHDL 2.6 04/03/2012   CHOLHDL 2.8 03/11/2009   No results found for this basename: LDLDIRECT      Radiology: CXR with no acute abnormalities EKG: NSR, old inferior MI, LVH with inferior and anterolateral TWIs (similar to prior).  Followup ECG with accelerated junctional rhythm in the 50s, RBBB morphology  ASSESSMENT AND PLAN:  61 yo with history of CAD and recent CVA presented with profound weakness and was found to have troponin of 5.  He has been going in and out of a junctional rhythm with rate in 50s.  1. NSTEMI: Atypical symptoms but troponin is 5.  He  has evidence for prior inferior MI which was also seen at prior admission so is not new.  However, the runs of junctional rhythm are concerning for possible RCA disease with conduction system effect. I think that the safest course will be LHC tonight to define his anatomy and intervene if appropriate.  2. CVA: Recent CVA.  Current symptoms are diffuse weakness, no focal neurological abnormalities.  3. Claudication: Unable to feel pedal pulses.  Suspect there is PAD.  He should have peripheral arterial dopplers at some point.  4. Cardiomyopathy: EF 45-50% on recent echo.  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  Would have him on ACEI.  If rhythm stabilizes, would eventually start beta blocker.    Signed: Marca Ancona 04/11/2012, 10:26 PM

## 2012-04-12 ENCOUNTER — Encounter (HOSPITAL_COMMUNITY): Payer: Self-pay | Admitting: *Deleted

## 2012-04-12 DIAGNOSIS — I2510 Atherosclerotic heart disease of native coronary artery without angina pectoris: Secondary | ICD-10-CM

## 2012-04-12 DIAGNOSIS — I200 Unstable angina: Secondary | ICD-10-CM | POA: Diagnosis not present

## 2012-04-12 LAB — CBC
HCT: 39.9 % (ref 39.0–52.0)
Hemoglobin: 13.5 g/dL (ref 13.0–17.0)
MCHC: 33.8 g/dL (ref 30.0–36.0)
MCV: 82.3 fL (ref 78.0–100.0)

## 2012-04-12 LAB — CK TOTAL AND CKMB (NOT AT ARMC)
CK, MB: 35.3 ng/mL (ref 0.3–4.0)
Relative Index: 4.9 — ABNORMAL HIGH (ref 0.0–2.5)
Total CK: 722 U/L — ABNORMAL HIGH (ref 7–232)
Total CK: 723 U/L — ABNORMAL HIGH (ref 7–232)

## 2012-04-12 LAB — TROPONIN I
Troponin I: >20 ng/mL (ref <0.30)
Troponin I: >20 ng/mL (ref <0.30)
Troponin I: 13.63 ng/mL (ref <0.30)

## 2012-04-12 LAB — BASIC METABOLIC PANEL
CO2: 28 mEq/L (ref 19–32)
Chloride: 96 mEq/L (ref 96–112)
GFR calc Af Amer: 89 mL/min — ABNORMAL LOW (ref >90)
Potassium: 3.6 mEq/L (ref 3.5–5.1)

## 2012-04-12 LAB — PLATELET INHIBITION P2Y12: Platelet Function  P2Y12: 208 [PRU] (ref 194–418)

## 2012-04-12 MED ORDER — ONDANSETRON HCL 4 MG/2ML IJ SOLN: 4.0000 mg | Freq: Four times a day (QID) | INTRAMUSCULAR | Status: DC | PRN

## 2012-04-12 MED ORDER — LORAZEPAM 2 MG/ML IJ SOLN: 1.0000 mg | Freq: Four times a day (QID) | INTRAMUSCULAR | Status: DC | PRN

## 2012-04-12 MED ORDER — LORAZEPAM 1 MG PO TABS: 1.0000 mg | ORAL_TABLET | Freq: Four times a day (QID) | ORAL | Status: DC | PRN

## 2012-04-12 MED ORDER — CLOPIDOGREL BISULFATE 75 MG PO TABS
75.0000 mg | ORAL_TABLET | Freq: Every day | ORAL | Status: DC
  Administered 2012-04-12 – 2012-04-14 (×3): 75 mg via ORAL
  Filled 2012-04-12 (×4): qty 1

## 2012-04-12 MED ORDER — VITAMIN B-1 100 MG PO TABS
100.0000 mg | ORAL_TABLET | Freq: Every day | ORAL | Status: DC
  Administered 2012-04-12 – 2012-04-13 (×2): 100 mg via ORAL
  Filled 2012-04-12 (×2): qty 1

## 2012-04-12 MED ORDER — OXYCODONE-ACETAMINOPHEN 5-325 MG PO TABS: 1.0000 | ORAL_TABLET | ORAL | Status: DC | PRN

## 2012-04-12 MED ORDER — FOLIC ACID 1 MG PO TABS
1.0000 mg | ORAL_TABLET | Freq: Every day | ORAL | Status: DC
  Administered 2012-04-12 – 2012-04-13 (×2): 1 mg via ORAL
  Filled 2012-04-12 (×2): qty 1

## 2012-04-12 MED ORDER — SODIUM CHLORIDE 0.9 % IV SOLN
0.2500 mg/kg/h | INTRAVENOUS | Status: DC
  Administered 2012-04-11: 0.25 mg/kg/h via INTRAVENOUS

## 2012-04-12 MED ORDER — THIAMINE HCL 100 MG/ML IJ SOLN
100.0000 mg | Freq: Every day | INTRAMUSCULAR | Status: DC
  Filled 2012-04-12 (×2): qty 1

## 2012-04-12 MED ORDER — ATORVASTATIN CALCIUM 80 MG PO TABS
80.0000 mg | ORAL_TABLET | Freq: Every day | ORAL | Status: DC
  Administered 2012-04-12 – 2012-04-13 (×2): 80 mg via ORAL
  Filled 2012-04-12 (×4): qty 1

## 2012-04-12 MED ORDER — MORPHINE SULFATE 2 MG/ML IJ SOLN: 2.0000 mg | INTRAMUSCULAR | Status: DC | PRN

## 2012-04-12 MED ORDER — ACETAMINOPHEN 325 MG PO TABS: 650.0000 mg | ORAL_TABLET | ORAL | Status: DC | PRN

## 2012-04-12 MED ORDER — ASPIRIN 81 MG PO CHEW
81.0000 mg | CHEWABLE_TABLET | Freq: Every day | ORAL | Status: DC
  Administered 2012-04-12 – 2012-04-14 (×3): 81 mg via ORAL
  Filled 2012-04-12 (×3): qty 1

## 2012-04-12 MED ORDER — SODIUM CHLORIDE 0.9 % IV SOLN
0.2500 mg/kg/h | INTRAVENOUS | Status: AC
  Filled 2012-04-12: qty 250

## 2012-04-12 MED ORDER — ADULT MULTIVITAMIN W/MINERALS CH
1.0000 | ORAL_TABLET | Freq: Every day | ORAL | Status: DC
  Administered 2012-04-12 – 2012-04-13 (×2): 1 via ORAL
  Filled 2012-04-12 (×2): qty 1

## 2012-04-12 MED ORDER — SODIUM CHLORIDE 0.9 % IV SOLN
INTRAVENOUS | Status: AC
  Administered 2012-04-12: 75 mL/h via INTRAVENOUS

## 2012-04-12 NOTE — Progress Notes (Signed)
    SUBJECTIVE: No chest pain or SOB this am.   Tele: Sinus brady  BP 108/72  Pulse 54  Temp 98.4 F (36.9 C) (Oral)  Resp 17  Ht 5' 4.96" (1.65 m)  Wt 158 lb 11.7 oz (72 kg)  BMI 26.45 kg/m2  SpO2 98%  Intake/Output Summary (Last 24 hours) at 04/12/12 1610 Last data filed at 04/12/12 0400  Gross per 24 hour  Intake 239.38 ml  Output    250 ml  Net -10.62 ml    PHYSICAL EXAM General: Well developed, well nourished, in no acute distress. Alert and oriented x 3.  Psych:  Good affect, responds appropriately Neck: No JVD. No masses noted.  Lungs: Clear bilaterally with no wheezes or rhonci noted.  Heart: RRR with no murmurs noted. Abdomen: Bowel sounds are present. Soft, non-tender.  Extremities: No lower extremity edema. No hematoma right groin.   LABS: Basic Metabolic Panel:  Basename 04/11/12 2029  NA 134*  K 3.0*  CL 89*  CO2 27  GLUCOSE 104*  BUN 18  CREATININE 1.12  CALCIUM 10.6*  MG --  PHOS --   CBC:  Basename 04/11/12 2029  WBC 8.9  NEUTROABS 6.3  HGB 15.3  HCT 45.7  MCV 82.0  PLT 253   Cardiac Enzymes:  Basename 04/12/12 0130 04/11/12 2135  CKTOTAL 722* --  CKMB 34.2* --  CKMBINDEX -- --  TROPONINI >20.00* 5.32*   Current Meds:    . aspirin  81 mg Oral Daily  . atorvastatin  80 mg Oral q1800  . clopidogrel  75 mg Oral Q breakfast  . folic acid  1 mg Oral Daily  . multivitamin with minerals  1 tablet Oral Daily  . thiamine  100 mg Oral Daily   Or  . thiamine  100 mg Intravenous Daily     ASSESSMENT AND PLAN:  1. CAD/NSTEMI: Pt admitted with bradycardia, NSTEMI. Urgent cath last night and found to have occluded RCA. Now s/p DES x 1 distal RCA. Doing well.  Continue ASA and Plavix. P2Y12 pending this am. If PRU is greater than 230, will need to change to Brilinta. Continue statin. No beta blocker with bradycardia. Pt intolerant of Ace inh. With soft BP will not start ARB this am. Will keep in CCU today. OK to ambulate with cardiac  rehab. Follow up BMET, CBC this am.  2. Etoh abuse: Etoh withdrawal protocol in place.    MCALHANY,CHRISTOPHER  1/1/20146:38 AM

## 2012-04-13 DIAGNOSIS — I2510 Atherosclerotic heart disease of native coronary artery without angina pectoris: Secondary | ICD-10-CM | POA: Diagnosis not present

## 2012-04-13 DIAGNOSIS — I219 Acute myocardial infarction, unspecified: Secondary | ICD-10-CM

## 2012-04-13 DIAGNOSIS — I200 Unstable angina: Secondary | ICD-10-CM | POA: Diagnosis not present

## 2012-04-13 LAB — CBC
MCH: 27.2 pg (ref 26.0–34.0)
Platelets: 222 10*3/uL (ref 150–400)
RBC: 4.59 MIL/uL (ref 4.22–5.81)
WBC: 6.2 10*3/uL (ref 4.0–10.5)

## 2012-04-13 LAB — BASIC METABOLIC PANEL
CO2: 28 mEq/L (ref 19–32)
Calcium: 9.5 mg/dL (ref 8.4–10.5)
Chloride: 95 mEq/L — ABNORMAL LOW (ref 96–112)
Potassium: 3.3 mEq/L — ABNORMAL LOW (ref 3.5–5.1)
Sodium: 134 mEq/L — ABNORMAL LOW (ref 135–145)

## 2012-04-13 MED FILL — Dextrose Inj 5%: INTRAVENOUS | Qty: 50 | Status: AC

## 2012-04-13 NOTE — Care Management Note (Signed)
    Page 1 of 1   04/13/2012     10:40:58 AM   CARE MANAGEMENT NOTE 04/13/2012  Patient:  Randall Patel, Randall Patel   Account Number:  [id number]  Date Initiated:  04/13/2012  Documentation initiated by:  Junius Creamer  Subjective/Objective Assessment:   adm w mi     Action/Plan:   lives w fam, pcp dr Britt Bottom [place]   Anticipated DC Date:     Anticipated DC Plan:        DC Planning Services  CM consult      Choice offered to / List presented to:             Status of service:   Medicare Important Message given?   (If response is "NO", the following Medicare IM given date fields will be blank) Date Medicare IM given:   Date Additional Medicare IM given:    Discharge Disposition:    Per UR Regulation:  Reviewed for med. necessity/level of care/duration of stay  If discussed at Long Length of Stay Meetings, dates discussed:    Comments:  04/13/12 1040 debbie Eagan Shifflett rn,bsn

## 2012-04-13 NOTE — Progress Notes (Addendum)
CARDIAC REHAB PHASE I   PRE:  Rate/Rhythm: 70 SR PVC's  BP:  Supine:   Sitting: 120/79  Standing:    SaO2: 98 RA  MODE:  Ambulation: 550 ft   POST:  Rate/Rhythem: 70  BP:  Supine:   Sitting: 137/88  Standing:    SaO2: 100 RA Pt tolerated ambulation well without c/o of cp or SOB. VS stable. Completed MI discharge education with pt. Discussed smoking cessation with pt. Gave pt tips for quitting and coaching contact number.Pt states that he has not smoked and had any alcohol since 12/25 /13 and that he has no desire for either. Pt plans to quit both cold [place] and feels that he will not has any problems doing it. He seems very motivated to making changes. Pt agrees to Outpt. [facility] in [place], will send referral. Also discussed with pt his  AIC, low carb, low sodium and heart healthy diet. [id number] Beatrix Fetters

## 2012-04-13 NOTE — Progress Notes (Signed)
    SUBJECTIVE: No chest pain or SOB. No events.   BP 118/70  Pulse 62  Temp 98.1 F (36.7 C) (Oral)  Resp 14  Ht 5' 4.96" (1.65 m)  Wt 158 lb 11.7 oz (72 kg)  BMI 26.45 kg/m2  SpO2 96%  Intake/Output Summary (Last 24 hours) at 04/13/12 0657 Last data filed at 04/13/12 0000  Gross per 24 hour  Intake    840 ml  Output   1550 ml  Net   -710 ml    PHYSICAL EXAM General: Well developed, well nourished, in no acute distress. Alert and oriented x 3.  Psych:  Good affect, responds appropriately Neck: No JVD. No masses noted.  Lungs: Clear bilaterally with no wheezes or rhonci noted.  Heart: RRR with no murmurs noted. Abdomen: Bowel sounds are present. Soft, non-tender.  Extremities: No lower extremity edema.   LABS: Basic Metabolic Panel:  Basename 04/12/12 0750 04/11/12 2029  NA 135 134*  K 3.6 3.0*  CL 96 89*  CO2 28 27  GLUCOSE 100* 104*  BUN 17 18  CREATININE 1.04 1.12  CALCIUM 9.7 10.6*  MG -- --  PHOS -- --   CBC:  Basename 04/13/12 0538 04/12/12 0750 04/11/12 2029  WBC 6.2 6.8 --  NEUTROABS -- -- 6.3  HGB 12.5* 13.5 --  HCT 38.5* 39.9 --  MCV 83.9 82.3 --  PLT 222 208 --   Cardiac Enzymes:  Basename 04/12/12 1213 04/12/12 0750 04/12/12 0130  CKTOTAL 723* 709* 722*  CKMB 35.3* 33.0* 34.2*  CKMBINDEX -- -- --  TROPONINI 13.63* >20.00* >20.00*   Current Meds:    . aspirin  81 mg Oral Daily  . atorvastatin  80 mg Oral q1800  . clopidogrel  75 mg Oral Q breakfast  . folic acid  1 mg Oral Daily  . multivitamin with minerals  1 tablet Oral Daily  . thiamine  100 mg Oral Daily   Or  . thiamine  100 mg Intravenous Daily     ASSESSMENT AND PLAN:  1. CAD/NSTEMI: Pt admitted with bradycardia, NSTEMI. Urgent cath 04/11/12 and found to have occluded RCA. Now s/p DES x 1 distal RCA. Doing well. Continue ASA and Plavix. PRU was 208 so Plavix is effective. Continue statin. No beta blocker with bradycardia. Pt intolerant of Ace inh. Will start ARB this  am. Will transfer to telemetry unit today. Cardiac rehab today. Will get echo today to assess LV function.   2. Etoh abuse: Etoh withdrawal protocol in place.   Estaban Mainville  1/2/20146:57 AM

## 2012-04-13 NOTE — Progress Notes (Signed)
  Echocardiogram 2D Echocardiogram has been performed.  Dameon Soltis 04/13/2012, 10:21 AM

## 2012-04-14 ENCOUNTER — Encounter (HOSPITAL_COMMUNITY): Payer: Self-pay | Admitting: Cardiology

## 2012-04-14 DIAGNOSIS — I2589 Other forms of chronic ischemic heart disease: Secondary | ICD-10-CM | POA: Diagnosis not present

## 2012-04-14 DIAGNOSIS — I214 Non-ST elevation (NSTEMI) myocardial infarction: Secondary | ICD-10-CM | POA: Diagnosis present

## 2012-04-14 DIAGNOSIS — I2510 Atherosclerotic heart disease of native coronary artery without angina pectoris: Secondary | ICD-10-CM | POA: Diagnosis not present

## 2012-04-14 DIAGNOSIS — I255 Ischemic cardiomyopathy: Secondary | ICD-10-CM | POA: Diagnosis present

## 2012-04-14 DIAGNOSIS — I213 ST elevation (STEMI) myocardial infarction of unspecified site: Secondary | ICD-10-CM | POA: Insufficient documentation

## 2012-04-14 DIAGNOSIS — I200 Unstable angina: Secondary | ICD-10-CM | POA: Diagnosis not present

## 2012-04-14 LAB — BASIC METABOLIC PANEL
BUN: 12 mg/dL (ref 6–23)
CO2: 30 mEq/L (ref 19–32)
Calcium: 9.7 mg/dL (ref 8.4–10.5)
Chloride: 95 mEq/L — ABNORMAL LOW (ref 96–112)
GFR calc Af Amer: 83 mL/min — ABNORMAL LOW (ref >90)
GFR calc non Af Amer: 72 mL/min — ABNORMAL LOW (ref >90)

## 2012-04-14 LAB — CBC
Hemoglobin: 12.3 g/dL — ABNORMAL LOW (ref 13.0–17.0)
MCV: 83.6 fL (ref 78.0–100.0)
Platelets: 210 10*3/uL (ref 150–400)
RBC: 4.45 MIL/uL (ref 4.22–5.81)
WBC: 5.5 10*3/uL (ref 4.0–10.5)

## 2012-04-14 MED ORDER — LOSARTAN POTASSIUM 25 MG PO TABS
25.0000 mg | ORAL_TABLET | Freq: Every day | ORAL | Status: DC
  Administered 2012-04-14: 25 mg via ORAL
  Filled 2012-04-14: qty 1

## 2012-04-14 MED ORDER — ATORVASTATIN CALCIUM 80 MG PO TABS: 80.0000 mg | ORAL_TABLET | Freq: Every day | ORAL | Status: DC

## 2012-04-14 MED ORDER — CLOPIDOGREL BISULFATE 75 MG PO TABS: 75.0000 mg | ORAL_TABLET | Freq: Every day | ORAL | Status: DC

## 2012-04-14 MED ORDER — LANSOPRAZOLE 30 MG PO CPDR: 30.0000 mg | DELAYED_RELEASE_CAPSULE | Freq: Every day | ORAL | Status: DC

## 2012-04-14 MED ORDER — ASPIRIN 81 MG PO CHEW: 81.0000 mg | CHEWABLE_TABLET | Freq: Every day | ORAL | Status: DC

## 2012-04-14 MED ORDER — NITROGLYCERIN 0.4 MG SL SUBL: 0.4000 mg | SUBLINGUAL_TABLET | SUBLINGUAL | Status: DC | PRN

## 2012-04-14 MED ORDER — LOSARTAN POTASSIUM 25 MG PO TABS: 25.0000 mg | ORAL_TABLET | Freq: Every day | ORAL | Status: DC

## 2012-04-14 NOTE — Progress Notes (Signed)
    SUBJECTIVE: No chest pain or SOB.   BP 123/79  Pulse 63  Temp 98.6 F (37 C) (Oral)  Resp 15  Ht 5' 4.96" (1.65 m)  Wt 158 lb 9.6 oz (71.94 kg)  BMI 26.42 kg/m2  SpO2 94%  Intake/Output Summary (Last 24 hours) at 04/14/12 1610 Last data filed at 04/14/12 0319  Gross per 24 hour  Intake    225 ml  Output    950 ml  Net   -725 ml    PHYSICAL EXAM General: Well developed, well nourished, in no acute distress. Alert and oriented x 3.  Psych:  Good affect, responds appropriately Neck: No JVD. No masses noted.  Lungs: Clear bilaterally with no wheezes or rhonci noted.  Heart: RRR with no murmurs noted. Abdomen: Bowel sounds are present. Soft, non-tender.  Extremities: No lower extremity edema.   LABS: Basic Metabolic Panel:  Basename 04/14/12 0602 04/13/12 0538  NA 134* 134*  K 3.8 3.3*  CL 95* 95*  CO2 30 28  GLUCOSE 94 92  BUN 12 11  CREATININE 1.10 1.14  CALCIUM 9.7 9.5  MG -- --  PHOS -- --   CBC:  Basename 04/14/12 0602 04/13/12 0538 04/11/12 2029  WBC 5.5 6.2 --  NEUTROABS -- -- 6.3  HGB 12.3* 12.5* --  HCT 37.2* 38.5* --  MCV 83.6 83.9 --  PLT 210 222 --   Cardiac Enzymes:  Basename 04/12/12 1213 04/12/12 0750 04/12/12 0130  CKTOTAL 723* 709* 722*  CKMB 35.3* 33.0* 34.2*  CKMBINDEX -- -- --  TROPONINI 13.63* >20.00* >20.00*   Current Meds:    . aspirin  81 mg Oral Daily  . atorvastatin  80 mg Oral q1800  . clopidogrel  75 mg Oral Q breakfast   Echo 04/14/11: Left ventricle: The cavity size was normal. Wall thickness was normal. Systolic function was moderately reduced. The estimated ejection fraction was in the range of 35% to 40%. Diffuse hypokinesis. Doppler parameters are consistent with abnormal left ventricular relaxation (grade 1 diastolic dysfunction). - Atrial septum: No defect or patent foramen ovale was identified.  ASSESSMENT AND PLAN:  1. CAD/NSTEMI: Pt admitted with bradycardia, NSTEMI. Urgent cath 04/11/12 and found  to have occluded RCA. Now s/p DES x 1 distal RCA. Doing well. Continue ASA and Plavix. PRU was 208 so Plavix is effective. Continue statin (Pravastatin at home so will change to Lipitor 80 mg po QHS and make sure he knows not to take Pravastatin) and will start ARB (Pt intolerant of Ace inh). No beta blocker with bradycardia (he had been on lopressor at home).  Echo with LVEF of 35-40%. Will need repeat echo in 6-8 weeks. Will not restart Norvasc.  2. Ischemic cardiomyopathy: As above, medical management for now. Resume low dose Lasix (his home dose is 20 mg per day)  3. Dispo: D/C home today. He can f/u with Tereso Newcomer, PA-C or Norma Fredrickson NP in 2-3 weeks and then next f/u with me.      Randall Patel  1/3/20147:14 AM

## 2012-04-14 NOTE — Discharge Summary (Signed)
Discharge Summary   Patient ID: Randall Patel MRN: [id number], DOB/AGE: [birth year] 62 y.o.  Primary MD: [name], MD Primary Cardiologist: Verne Carrow MD Admit date: 04/11/2012 D/C date:     04/14/2012      Primary Discharge Diagnoses:  1. NSTEMI/CAD  - Cath showed triple vessel dz including occluded RCA, s/p DES to distal RCA, PTCA posterolateral branch  - Dc'd on ASA, Plavix, Lipitor, Cozaar  2. Ischemic Cardiomyopathy, EF 35-40%  - repeat echo in 6-8 weeks  3. Bradycardia  - Initially presented with intermittent accelerated junctional rhythm in the 50s with RBBB/LAFB morphology  - Stopped Lopressor  4. Hyperlipidemia  - Pravastatin changed to Lipitor 80mg   - F/u Lipids/LFTs in 6 weeks  5. Claudication  - Outpatient peripheral arterial dopplers   6. Tobacco Abuse 7. ETOH Abuse  Secondary Discharge Diagnoses:  Past Medical History  Diagnosis Date  . Hypertension   . Coronary artery disease     MI in 2008 with PCI to ? vessel; NSTEMI 03/2012; NSTEMI 04/2012 Cath showed triple vessel dz including occluded RCA, s/p DES to distal RCA, PTCA posterolateral branch   . CVA (cerebral infarction)     03/2012 with left arm weakness/numbness and left facial droop.    Allergies Allergies  Allergen Reactions  . Lisinopril Swelling    Diagnostic Studies/Procedures:  04/11/12 - Cardiac Cath Hemodynamic Findings:  Central aortic pressure: 113/75  Left ventricular pressure: 106/5/9  Angiographic Findings:  Left main: Ostial 30% stenosis with heavy calcification.  Left Anterior Descending Artery: Large caliber vessel that courses to the apex. The proximal vessel has 30% stenosis. The mid vessel has a 60% stenosis at the takeoff of a septal perforating branch. Moderate caliber diagonal branch with mild plaque disease.  Circumflex Artery: Moderate caliber vessel. The proximal vessel has diffuse 30% stenosis. The first 2 obtuse marginal branches are very  small. The third obtuse marginal branch is a small bifurcating vessel with diffuse 90% stenosis involving both branches. (1.75 mm vessel)  Right Coronary Artery: Large, dominant artery with 50% mid stenosis and 100% distal occlusion.  Left Ventricular Angiogram: LVEF=30-35% with akinesis of the inferior wall.  Impression:  1. NSTEMI  2. Triple vessel CAD with occluded RCA, culprit vessel.  3. Successful PTCA/DES x 1 distal RCA  4. Successful PTCA with balloon angioplasty only of the posterolateral branch.  5. Segmental LV systolic dysfunction  Recommendations: Will continue ASA and Plavix for at least one year but longer if he tolerates. Will start statin. Will hold beta blocker secondary to bradycardia. He is intolerant to Ace-inhibitors. Will run Angiomax for two hours then stop. Plavix was used. (Effient contraindicated with prior CVA). He has been on long term Plavix. Will check P2Y12 in am. If PRU is greater than 230, will stop Plavix and load with Brilinta.   04/13/12 - Echo Study Conclusions: - Left ventricle: The cavity size was normal. Wall thickness was normal. Systolic function was moderately reduced. The estimated ejection fraction was in the range of 35% to 40%. Diffuse hypokinesis. Doppler parameters are consistent with abnormal left ventricular relaxation (grade 1 diastolic dysfunction). - Atrial septum: No defect or patent foramen ovale was identified.    History of Present Illness: 62 y.o. male w/ the above medical problems who presented to [facility]11[facility]12 with NSTEMI. On the day prior to presentation he had central chest pain that lasted ~30 mins. On day of presentation he woke up with weakness, nausea, and vomiting prompting him  to present to the ED.  Hospital Course: In the ED, initial EKG revealed NSR, old inferior MI, LVH with inferior and anterolateral TWIs (similar to prior).  Repeat EKG showed accelerated junctional rhythm in the 50s, RBBB morphology. CXR  was without acute cardiopulmonary abnormalities. Labs were significant for troponin 5. He was taken to the cath lab where cardiac cath showed triple vessel CAD w/ occluded RCA and segmental LV systolic dysfunction, EF 30-35%. He was treated with balloon angioplasty of the posterolateral branch and PTCA/DES to distal RCA. He tolerated the procedure well without complications.  Recommendations were made for continued DAPT with ASA/Plavix for at least one year, high dose statin, and discontinuation of BB. A P2Y12 was checked with PRU 208 showing effective platelet inhibition with plavix.   Cardiac enzymes were cycled with peak troponin >20. Echo showed EF 35-40%, mild diastolic dysfunction, and diffuse LV hypokinesis. He has a history of ACEI intolerance so an ARB was initiated. He was able to ambulate with cardiac rehab without chest pain or shortness of breath. Cath site remained stable. He was seen and evaluated by Dr. Clifton James who felt he was stable for discharge home with plans for follow up as scheduled below. On presentation he was noted to complain of claudication symptoms and on exam had absent pedal pulses. It is recommended he have peripheral arterial dopplers at some point to assess for PAD. He was started on Prevacid given that he takes mobic and is on DAPT.  Discharge Vitals: Blood pressure 123/79, pulse 63, temperature 98.6 F (37 C), temperature source Oral, resp. rate 15, height 5' 4.96" (1.65 m), weight 158 lb 9.6 oz (71.94 kg), SpO2 94.00%.  Labs: Component Value Date   WBC 5.5 04/14/2012   HGB 12.3* 04/14/2012   HCT 37.2* 04/14/2012   MCV 83.6 04/14/2012   PLT 210 04/14/2012    Lab 04/14/12 0602 04/11/12 2029  NA 134* --  K 3.8 --  CL 95* --  CO2 30 --  BUN 12 --  CREATININE 1.10 --  CALCIUM 9.7 --  PROT -- 8.6*  BILITOT -- 0.6  ALKPHOS -- 42  ALT -- 32  AST -- 106*  GLUCOSE 94 --   Basename 04/12/12 1213 04/12/12 0750 04/12/12 0130 04/11/12 2135  CKTOTAL 723* 709* 722* --    CKMB 35.3* 33.0* 34.2* --  TROPONINI 13.63* >20.00* >20.00* 5.32*   Component Value Date   CHOL 286* 04/03/2012   HDL 109 04/03/2012   LDLCALC 155* 04/03/2012   TRIG 108 04/03/2012     04/12/2012 07:50  Platelet Function  P2Y12 208     Discharge Medications     Medication List     As of 04/14/2012 11:40 AM    STOP taking these medications         amLODipine 10 MG tablet   Commonly known as: NORVASC      metoprolol 50 MG tablet   Commonly known as: LOPRESSOR      pravastatin 20 MG tablet   Commonly known as: PRAVACHOL      TAKE these medications         aspirin 81 MG chewable tablet   Chew 1 tablet (81 mg total) by mouth daily.      atorvastatin 80 MG tablet   Commonly known as: LIPITOR   Take 1 tablet (80 mg total) by mouth at bedtime.      clopidogrel 75 MG tablet   Commonly known as: PLAVIX   Take 1 tablet (  75 mg total) by mouth daily with breakfast.      furosemide 20 MG tablet   Commonly known as: LASIX   Take 20 mg by mouth daily.      lansoprazole 30 MG capsule   Commonly known as: PREVACID   Take 1 capsule (30 mg total) by mouth daily.      losartan 25 MG tablet   Commonly known as: COZAAR   Take 1 tablet (25 mg total) by mouth daily.      meloxicam 7.5 MG tablet   Commonly known as: MOBIC   Take 7.5 mg by mouth 2 (two) times daily.      nitroGLYCERIN 0.4 MG SL tablet   Commonly known as: NITROSTAT   Place 1 tablet (0.4 mg total) under the tongue every 5 (five) minutes as needed for chest pain (up to 3 doses).         Disposition   Discharge Orders    Future Appointments: Provider: Department: Dept Phone: Center:   04/28/2012 10:30 AM Beatrice Lecher, PA [facility] Main Office [place]) [phone] [facility]   06/07/2012 1:00 PM [facility] SITE 3 ECHO LAB [phone] None   06/07/2012 2:15 PM Kathleene Hazel, MD [facility] Main Office [place]) [phone] [facility]     Future  Orders Please Complete By Expires   Amb Referral to Cardiac Rehabilitation      Diet - low sodium heart healthy      Increase activity slowly      Discharge instructions      Comments:   * Please take all medications as prescribed and bring them with you to your office visits. * Your cholesterol medication (Pravastatin) has been changed to Atorvastatin. You will need follow up blood work in 6 weeks. * Your metoprolol (Lopressor) was stopped due to a low heart rate. * Please avoid tobacco and ETOH consumption.  * KEEP GROIN SITE CLEAN AND DRY. Call the office for any signs of bleedings, pus, swelling, increased pain, or any other concerns. * NO HEAVY LIFTING (>10lbs) X 2 WEEKS. * NO SEXUAL ACTIVITY X 2 WEEKS. * NO DRIVING X 5 DAYS. * NO SOAKING BATHS, HOT TUBS, POOLS, ETC., X 5 DAYS.     Follow-up Information    Follow up with Tereso Newcomer, PA. On 04/28/2012. (10:30)    Contact information:   [facility] [address] [place] [address] [phone]       Follow up with [facility]. On 06/07/2012. (Echocardiogram @ 1:00)    Contact information:   [address] [place] [address] [phone]      Follow up with Verne Carrow, MD. On 06/07/2012. (2:15)    Contact information:   [facility] [address] [place] [address] [phone]           Outstanding Labs/Studies:  1. F/u Lipids/LFTs in 6 weeks 2. Repeat echo in 6-8 weeks 3. Peripheral arterial dopplers   Duration of Discharge Encounter: Greater than 30 minutes including physician and PA time.  Signed, Carloyn Lahue PA-C 04/14/2012, 11:40 AM

## 2012-04-14 NOTE — Progress Notes (Addendum)
[id number] Cardiac Rehab Completed CHF discharge education with pt and answered his questions.He voices understanding. Gave pt CHF information. Discussed signs and symptoms of CHF, daily weights, low sodium diet and when to notify MD.He voices understanding of CHF zones.

## 2012-04-14 NOTE — Progress Notes (Signed)
Discharge instructions and CHF education packet reviewed with patient at discharge.  Patient's home medications picked up from main pharmacy and returned to patient.

## 2012-04-14 NOTE — Discharge Summary (Signed)
See full note this am. cdm 

## 2012-04-28 ENCOUNTER — Encounter: Payer: Medicare Other | Admitting: Nurse Practitioner

## 2012-04-28 ENCOUNTER — Encounter: Payer: Medicare Other | Admitting: Physician Assistant

## 2012-06-06 ENCOUNTER — Other Ambulatory Visit (HOSPITAL_COMMUNITY): Payer: Self-pay | Admitting: Cardiovascular Disease

## 2012-06-06 DIAGNOSIS — I255 Ischemic cardiomyopathy: Secondary | ICD-10-CM

## 2012-06-07 ENCOUNTER — Ambulatory Visit (INDEPENDENT_AMBULATORY_CARE_PROVIDER_SITE_OTHER): Payer: Medicaid Other | Admitting: Cardiovascular Disease

## 2012-06-07 ENCOUNTER — Encounter: Payer: Self-pay | Admitting: Cardiovascular Disease

## 2012-06-07 ENCOUNTER — Ambulatory Visit (HOSPITAL_COMMUNITY): Payer: Medicare Other | Attending: Cardiovascular Disease | Admitting: Radiology

## 2012-06-07 VITALS — BP 140/90 | HR 83 | Resp 12 | Ht 65.0 in | Wt 160.0 lb

## 2012-06-07 DIAGNOSIS — F17200 Nicotine dependence, unspecified, uncomplicated: Secondary | ICD-10-CM | POA: Diagnosis not present

## 2012-06-07 DIAGNOSIS — I079 Rheumatic tricuspid valve disease, unspecified: Secondary | ICD-10-CM | POA: Insufficient documentation

## 2012-06-07 DIAGNOSIS — I2589 Other forms of chronic ischemic heart disease: Secondary | ICD-10-CM

## 2012-06-07 DIAGNOSIS — I059 Rheumatic mitral valve disease, unspecified: Secondary | ICD-10-CM | POA: Diagnosis not present

## 2012-06-07 DIAGNOSIS — I2510 Atherosclerotic heart disease of native coronary artery without angina pectoris: Secondary | ICD-10-CM | POA: Diagnosis not present

## 2012-06-07 DIAGNOSIS — I255 Ischemic cardiomyopathy: Secondary | ICD-10-CM

## 2012-06-07 DIAGNOSIS — I252 Old myocardial infarction: Secondary | ICD-10-CM | POA: Insufficient documentation

## 2012-06-07 DIAGNOSIS — E785 Hyperlipidemia, unspecified: Secondary | ICD-10-CM | POA: Diagnosis not present

## 2012-06-07 MED ORDER — CLOPIDOGREL BISULFATE 75 MG PO TABS: 75.0000 mg | ORAL_TABLET | Freq: Every day | ORAL | Status: DC

## 2012-06-07 MED ORDER — CARVEDILOL 3.125 MG PO TABS
3.1250 mg | ORAL_TABLET | Freq: Two times a day (BID) | ORAL | Status: DC
Start: 2012-06-07 — End: 2013-07-27

## 2012-06-07 MED ORDER — ATORVASTATIN CALCIUM 80 MG PO TABS: 80.0000 mg | ORAL_TABLET | Freq: Every day | ORAL | Status: DC

## 2012-06-07 MED ORDER — LOSARTAN POTASSIUM 25 MG PO TABS: 25.0000 mg | ORAL_TABLET | Freq: Every day | ORAL | Status: DC

## 2012-06-07 NOTE — Patient Instructions (Signed)
Your physician wants you to follow-up in:  6 months.  You will receive a reminder letter in the mail two months in advance. If you don't receive a letter, please call our office to schedule the follow-up appointment.  Your physician recommends that you return for fasting lab work in:  1 month--last week of March.  The lab opens at 7:30 every week day. Lipid and Liver profiles and BMP  Your physician has recommended you make the following change in your medication:  Stop Lansoprazole.  Start Coreg 3.125 mg by mouth twice daily.

## 2012-06-07 NOTE — Progress Notes (Signed)
History of Present Illness: 62 yo with history of CAD s/p MI in 2008 and CVA in 12/13 admitted with bradycardia, chest pain and taken to cath lab as urgent NSTEMI 04/11/12 and was found to have a totally occluded distal RCA. This was treated with a drug eluting stent in the mid to distal RCA and angioplasty of the Posterolateral segment. His LVEF post MI was 35-40%.   He is here today for follow up. He tells me that he is feeling well. He denies any chest pain or SOB. He has considered cardiac rehab but does not wish to enroll.   Primary Care Physician: Clyda Greener  Last Lipid Profile:Lipid Panel     Component Value Date/Time   CHOL 286* 04/03/2012 0545   TRIG 108 04/03/2012 0545   HDL 109 04/03/2012 0545   CHOLHDL 2.6 04/03/2012 0545   VLDL 22 04/03/2012 0545   LDLCALC 155* 04/03/2012 0545     Past Medical History  Diagnosis Date  . Hypertension   . Coronary artery disease     MI in 2008 with PCI to ? vessel; NSTEMI 03/2012; NSTEMI 04/2012 Cath showed triple vessel dz including occluded RCA, s/p DES to distal RCA, PTCA posterolateral branch   . CVA (cerebral infarction)     03/2012 with left arm weakness/numbness and left facial droop.  . Ischemic cardiomyopathy     04/2012 EF 35-40%  . Hyperlipidemia   . Claudication   . Tobacco abuse     quit 04/06/12  . ETOH abuse     quit 04/06/12    Past Surgical History  Procedure Laterality Date  . Hernia repair      Current Outpatient Prescriptions  Medication Sig Dispense Refill  . aspirin 81 MG chewable tablet Chew 1 tablet (81 mg total) by mouth daily.      [name] atorvastatin (LIPITOR) 80 MG tablet Take 1 tablet (80 mg total) by mouth at bedtime.  30 tablet  3  . clopidogrel (PLAVIX) 75 MG tablet Take 1 tablet (75 mg total) by mouth daily with breakfast.  30 tablet  6  . furosemide (LASIX) 20 MG tablet Take 20 mg by mouth daily.      . lansoprazole (PREVACID) 30 MG capsule Take 1 capsule (30 mg total) by mouth daily.  30  capsule  6  . losartan (COZAAR) 25 MG tablet Take 1 tablet (25 mg total) by mouth daily.  30 tablet  6  . meloxicam (MOBIC) 7.5 MG tablet Take 7.5 mg by mouth 2 (two) times daily.      . nitroGLYCERIN (NITROSTAT) 0.4 MG SL tablet Place 1 tablet (0.4 mg total) under the tongue every 5 (five) minutes as needed for chest pain (up to 3 doses).  25 tablet  3   No current facility-administered medications for this visit.    Allergies  Allergen Reactions  . Lisinopril Swelling    History   Social History  . Marital Status: Single    Spouse Name: N/A    Number of Children: N/A  . Years of Education: N/A   Occupational History  . Not on file.   Social History Main Topics  . Smoking status: Current Every Day Smoker -- 1.00 packs/day for 40 years    Types: Cigarettes  . Smokeless tobacco: Never Used  . Alcohol Use: Yes     Comment: a 40oz or 2 per day  . Drug Use: No  . Sexually Active:    Other Topics Concern  .  Not on file   Social History Narrative  . No narrative on file    No family history on file.  Review of Systems:  As stated in the HPI and otherwise negative.   BP 140/90  Pulse 83  Resp 12  Ht 5\' 5"  (1.651 m)  Wt 160 lb (72.576 kg)  BMI 26.63 kg/m2  Physical Examination: General: Well developed, well nourished, NAD HEENT: OP clear, mucus membranes moist SKIN: warm, dry. No rashes. Neuro: No focal deficits Musculoskeletal: Muscle strength 5/5 all ext Psychiatric: Mood and affect normal Neck: No JVD, no carotid bruits, no thyromegaly, no lymphadenopathy. Lungs:Clear bilaterally, no wheezes, rhonci, crackles Cardiovascular: Regular rate and rhythm. No murmurs, gallops or rubs. Abdomen:Soft. Bowel sounds present. Non-tender.  Extremities: No lower extremity edema. Pulses are 2 + in the bilateral DP/PT.  Echo: 06/07/12: Left ventricle: The cavity size was normal. Wall thickness was normal. Systolic function was moderately reduced. The estimated ejection  fraction was in the range of 35% to 40%. There is hypokinesis of the entireinferior myocardium. Doppler parameters are consistent with abnormal left ventricular relaxation (grade 1 diastolic dysfunction). - Mitral valve: Calcified annulus. Mild regurgitation.  Cardiac cath 04/11/13: Left main: Ostial 30% stenosis with heavy calcification.  Left Anterior Descending Artery: Large caliber vessel that courses to the apex. The proximal vessel has 30% stenosis. The mid vessel has a 60% stenosis at the takeoff of a septal perforating branch. Moderate caliber diagonal branch with mild plaque disease.  Circumflex Artery: Moderate caliber vessel. The proximal vessel has diffuse 30% stenosis. The first 2 obtuse marginal branches are very small. The third obtuse marginal branch is a small bifurcating vessel with diffuse 90% stenosis involving both branches. (1.75 mm vessel)  Right Coronary Artery: Large, dominant artery with 50% mid stenosis and 100% distal occlusion.  Left Ventricular Angiogram: LVEF=30-35% with akinesis of the inferior wall.   Assessment and Plan:   1. CAD: Doing well post NSTEMI. He is on ASA/Plavix/statin/ARB. Will now start Coreg 3.125 mg po BID. (He had not been on this post discharge because of bradycardia). He is encouraged to start cardiac rehab.   2. Ischemic cardiomyopathy: LVEF 35-40%. Will continue medical management. Will add Coreg 3.125 mg po BID.

## 2012-06-07 NOTE — Progress Notes (Signed)
Echocardiogram performed.  

## 2012-06-19 ENCOUNTER — Other Ambulatory Visit: Payer: Medicare Other

## 2012-07-10 ENCOUNTER — Other Ambulatory Visit: Payer: Medicare Other

## 2012-07-13 ENCOUNTER — Other Ambulatory Visit (INDEPENDENT_AMBULATORY_CARE_PROVIDER_SITE_OTHER): Payer: Medicare Other

## 2012-07-13 DIAGNOSIS — I2510 Atherosclerotic heart disease of native coronary artery without angina pectoris: Secondary | ICD-10-CM | POA: Diagnosis not present

## 2012-07-13 LAB — LIPID PANEL
HDL: 77.6 mg/dL (ref >39.00)
LDL Cholesterol: 95 mg/dL (ref 0–99)
Total CHOL/HDL Ratio: 2
VLDL: 13.6 mg/dL (ref 0.0–40.0)

## 2012-07-13 LAB — BASIC METABOLIC PANEL
CO2: 23 mEq/L (ref 19–32)
Glucose, Bld: 81 mg/dL (ref 70–99)
Potassium: 3.7 mEq/L (ref 3.5–5.1)
Sodium: 132 mEq/L — ABNORMAL LOW (ref 135–145)

## 2012-07-13 LAB — HEPATIC FUNCTION PANEL: Total Bilirubin: 0.3 mg/dL (ref 0.3–1.2)

## 2012-07-14 ENCOUNTER — Other Ambulatory Visit: Payer: Self-pay | Admitting: *Deleted

## 2012-07-14 ENCOUNTER — Telehealth: Payer: Self-pay | Admitting: Cardiovascular Disease

## 2012-07-14 DIAGNOSIS — I2510 Atherosclerotic heart disease of native coronary artery without angina pectoris: Secondary | ICD-10-CM

## 2012-07-14 MED ORDER — FUROSEMIDE 20 MG PO TABS: 20.0000 mg | ORAL_TABLET | Freq: Every day | ORAL | Status: DC

## 2012-07-14 MED ORDER — LOSARTAN POTASSIUM 25 MG PO TABS: 25.0000 mg | ORAL_TABLET | Freq: Every day | ORAL | Status: DC

## 2012-07-14 NOTE — Telephone Encounter (Signed)
Walk In pt Form " Pt Needs refills" sent to [facility] 07/14/12/KM

## 2012-07-14 NOTE — Telephone Encounter (Signed)
Medications requested by pt (furosemide and Losartan) refilled. I have tried to call pt 3 times today to give him this information but there is no answer.

## 2012-07-19 ENCOUNTER — Encounter: Payer: Self-pay | Admitting: *Deleted

## 2012-07-21 ENCOUNTER — Telehealth: Payer: Self-pay | Admitting: Cardiovascular Disease

## 2012-07-21 NOTE — Telephone Encounter (Signed)
New problem    Pt calling regarding test results

## 2012-07-21 NOTE — Telephone Encounter (Signed)
Pt aware of results and to continue meds

## 2012-10-21 ENCOUNTER — Encounter (HOSPITAL_COMMUNITY): Payer: Self-pay | Admitting: *Deleted

## 2012-10-21 ENCOUNTER — Emergency Department (HOSPITAL_COMMUNITY)
Admission: EM | Admit: 2012-10-21 | Discharge: 2012-10-21 | Disposition: A | Discharge status: Home / Self Care | Payer: Medicare Other | Attending: Emergency Medicine | Admitting: Emergency Medicine

## 2012-10-21 DIAGNOSIS — H9190 Unspecified hearing loss, unspecified ear: Secondary | ICD-10-CM

## 2012-10-21 DIAGNOSIS — Z7982 Long term (current) use of aspirin: Secondary | ICD-10-CM | POA: Insufficient documentation

## 2012-10-21 DIAGNOSIS — I2589 Other forms of chronic ischemic heart disease: Secondary | ICD-10-CM | POA: Insufficient documentation

## 2012-10-21 DIAGNOSIS — Z79899 Other long term (current) drug therapy: Secondary | ICD-10-CM | POA: Insufficient documentation

## 2012-10-21 DIAGNOSIS — Z7902 Long term (current) use of antithrombotics/antiplatelets: Secondary | ICD-10-CM | POA: Insufficient documentation

## 2012-10-21 DIAGNOSIS — H918X9 Other specified hearing loss, unspecified ear: Secondary | ICD-10-CM | POA: Diagnosis not present

## 2012-10-21 DIAGNOSIS — F1010 Alcohol abuse, uncomplicated: Secondary | ICD-10-CM | POA: Insufficient documentation

## 2012-10-21 DIAGNOSIS — I2510 Atherosclerotic heart disease of native coronary artery without angina pectoris: Secondary | ICD-10-CM | POA: Insufficient documentation

## 2012-10-21 DIAGNOSIS — H6122 Impacted cerumen, left ear: Secondary | ICD-10-CM

## 2012-10-21 DIAGNOSIS — I10 Essential (primary) hypertension: Secondary | ICD-10-CM | POA: Insufficient documentation

## 2012-10-21 DIAGNOSIS — F17200 Nicotine dependence, unspecified, uncomplicated: Secondary | ICD-10-CM | POA: Insufficient documentation

## 2012-10-21 DIAGNOSIS — H6120 Impacted cerumen, unspecified ear: Secondary | ICD-10-CM | POA: Diagnosis not present

## 2012-10-21 DIAGNOSIS — E785 Hyperlipidemia, unspecified: Secondary | ICD-10-CM | POA: Insufficient documentation

## 2012-10-21 DIAGNOSIS — Z8679 Personal history of other diseases of the circulatory system: Secondary | ICD-10-CM | POA: Insufficient documentation

## 2012-10-21 DIAGNOSIS — Z8673 Personal history of transient ischemic attack (TIA), and cerebral infarction without residual deficits: Secondary | ICD-10-CM | POA: Insufficient documentation

## 2012-10-21 NOTE — ED Provider Notes (Signed)
Medical screening examination/treatment/procedure(s) were performed by non-physician practitioner and as supervising physician I was immediately available for consultation/collaboration.  Flint Melter, MD 10/21/12 2050

## 2012-10-21 NOTE — ED Provider Notes (Signed)
History    CSN: [id number] Arrival date & time 10/21/12  1227  First MD Initiated Contact with Patient 10/21/12 1234     Chief Complaint  Patient presents with  . Left Ear Cannot Hear    (Consider location/radiation/quality/duration/timing/severity/associated sxs/prior Treatment) HPI  62 year old male with a significant comorbidity including CAD, CVA, and alcohol abuse presents complaining of hearing loss in the left ear. Pt report gradual hearing loss to L ear only . sxs ongoing x 2 months.  Sts whenever he pulls his ear lobe down, he can hear better.  Denies fever, headache, ear pain, ringing in ear, rash, jaw pain or any other complaint.  Pt think it's due to ear wax build up.  Admits to using Q-tip to clean ear regularly.    Past Medical History  Diagnosis Date  . Hypertension   . Coronary artery disease     MI in 2008 with PCI to ? vessel; NSTEMI 03/2012; NSTEMI 04/2012 Cath showed triple vessel dz including occluded RCA, s/p DES to distal RCA, PTCA posterolateral branch   . CVA (cerebral infarction)     03/2012 with left arm weakness/numbness and left facial droop.  . Ischemic cardiomyopathy     04/2012 EF 35-40%  . Hyperlipidemia   . Claudication   . Tobacco abuse     quit 04/06/12  . ETOH abuse     quit 04/06/12   Past Surgical History  Procedure Laterality Date  . Hernia repair     No family history on file. History  Substance Use Topics  . Smoking status: Current Every Day Smoker -- 1.00 packs/day for 40 years    Types: Cigarettes  . Smokeless tobacco: Never Used  . Alcohol Use: Yes     Comment: a 40oz or 2 per day    Review of Systems  Constitutional: Negative for fever.  HENT: Positive for hearing loss. Negative for ear pain, congestion, rhinorrhea, trouble swallowing, neck pain, dental problem and ear discharge.   Skin: Negative for rash.  Neurological: Negative for headaches.    Allergies  Lisinopril  Home Medications   Current Outpatient Rx   Name  Route  Sig  Dispense  Refill  . aspirin 81 MG chewable tablet   Oral   Chew 1 tablet (81 mg total) by mouth daily.         [name] atorvastatin (LIPITOR) 80 MG tablet   Oral   Take 1 tablet (80 mg total) by mouth at bedtime.   90 tablet   2   . carvedilol (COREG) 3.125 MG tablet   Oral   Take 1 tablet (3.125 mg total) by mouth 2 (two) times daily with a meal.   180 tablet   2   . clopidogrel (PLAVIX) 75 MG tablet   Oral   Take 1 tablet (75 mg total) by mouth daily with breakfast.   90 tablet   2   . furosemide (LASIX) 20 MG tablet   Oral   Take 1 tablet (20 mg total) by mouth daily.   90 tablet   3   . losartan (COZAAR) 25 MG tablet   Oral   Take 1 tablet (25 mg total) by mouth daily.   90 tablet   3   . meloxicam (MOBIC) 7.5 MG tablet   Oral   Take 7.5 mg by mouth 2 (two) times daily.         . nitroGLYCERIN (NITROSTAT) 0.4 MG SL tablet   Sublingual   Place  1 tablet (0.4 mg total) under the tongue every 5 (five) minutes as needed for chest pain (up to 3 doses).   25 tablet   3    BP 182/114  Pulse 96  Temp(Src) 99 F (37.2 C) (Oral)  Resp 16  SpO2 97% Physical Exam  Vitals reviewed. Constitutional: He appears well-developed and well-nourished. No distress.  HENT:  Head: Atraumatic.  Cerumen impaction to both ear.  L>R.  Decreased hearing with finger rub.  Normal external ear canal, no tenderness, no rash  Eyes: Conjunctivae are normal.  Neck: Neck supple.  Neurological: He is alert.  Skin: Skin is warm. No rash noted.    ED Course  Procedures (including critical care time)  1:01 PM Pt with decreased hearing to L ear.  Pt has cerumen impaction.  I was able to removed the impaction and pt report improvement of hearing.  I do not suspect any significant pathology causing hearing changes aside from cerumen impaction at this time.  Pt's BP high, recommend recheck at PCP's office.    Ceruminosis is noted.  Wax is removed by syringing and manual  debridement. Instructions for home care to prevent wax buildup are given.  Labs Reviewed - No data to display No results found. 1. Cerumen impaction, left   2. Perceived hearing changes     MDM  BP 182/114  Pulse 96  Temp(Src) 99 F (37.2 C) (Oral)  Resp 16  SpO2 97%  I have reviewed nursing notes and vital signs.  I reviewed available ER/hospitalization records thought the EMR   Fayrene Helper, [place] 10/21/12 1304

## 2012-10-21 NOTE — ED Notes (Signed)
Pt states for a couple of months has not been able to hear out of left ear.

## 2012-11-24 ENCOUNTER — Ambulatory Visit: Payer: Medicare Other | Admitting: Cardiovascular Disease

## 2013-01-12 ENCOUNTER — Ambulatory Visit (INDEPENDENT_AMBULATORY_CARE_PROVIDER_SITE_OTHER): Payer: Medicare Other | Admitting: Cardiovascular Disease

## 2013-01-12 ENCOUNTER — Encounter: Payer: Self-pay | Admitting: Cardiovascular Disease

## 2013-01-12 VITALS — BP 133/97 | HR 97 | Wt 157.0 lb

## 2013-01-12 DIAGNOSIS — I2589 Other forms of chronic ischemic heart disease: Secondary | ICD-10-CM | POA: Diagnosis not present

## 2013-01-12 DIAGNOSIS — I2510 Atherosclerotic heart disease of native coronary artery without angina pectoris: Secondary | ICD-10-CM

## 2013-01-12 DIAGNOSIS — Z720 Tobacco use: Secondary | ICD-10-CM

## 2013-01-12 DIAGNOSIS — F17200 Nicotine dependence, unspecified, uncomplicated: Secondary | ICD-10-CM

## 2013-01-12 DIAGNOSIS — I255 Ischemic cardiomyopathy: Secondary | ICD-10-CM

## 2013-01-12 NOTE — Progress Notes (Signed)
History of Present Illness: 62 yo with history of CAD s/p MI in 2008 and CVA in 12/13 admitted with bradycardia, chest pain and taken to cath lab as urgent NSTEMI 04/11/12 and was found to have a totally occluded distal RCA. This was treated with a drug eluting stent in the mid to distal RCA and angioplasty of the Posterolateral segment. His LVEF post MI was 35-40%. He continues to smoke.   He is here today for follow up. He tells me that he is feeling well. He denies any chest pain or SOB.   Primary Care Physician: Clyda Greener  Last Lipid Profile:Lipid Panel     Component Value Date/Time   CHOL 186 07/13/2012 1351   TRIG 68.0 07/13/2012 1351   HDL 77.60 07/13/2012 1351   CHOLHDL 2 07/13/2012 1351   VLDL 13.6 07/13/2012 1351   LDLCALC 95 07/13/2012 1351     Past Medical History  Diagnosis Date  . Hypertension   . Coronary artery disease     MI in 2008 with PCI to ? vessel; NSTEMI 03/2012; NSTEMI 04/2012 Cath showed triple vessel dz including occluded RCA, s/p DES to distal RCA, PTCA posterolateral branch   . CVA (cerebral infarction)     03/2012 with left arm weakness/numbness and left facial droop.  . Ischemic cardiomyopathy     04/2012 EF 35-40%  . Hyperlipidemia   . Claudication   . Tobacco abuse     quit 04/06/12  . ETOH abuse     quit 04/06/12    Past Surgical History  Procedure Laterality Date  . Hernia repair      Current Outpatient Prescriptions  Medication Sig Dispense Refill  . aspirin 81 MG chewable tablet Chew 1 tablet (81 mg total) by mouth daily.      [name] atorvastatin (LIPITOR) 80 MG tablet Take 1 tablet (80 mg total) by mouth at bedtime.  90 tablet  2  . carvedilol (COREG) 3.125 MG tablet Take 1 tablet (3.125 mg total) by mouth 2 (two) times daily with a meal.  180 tablet  2  . clopidogrel (PLAVIX) 75 MG tablet Take 1 tablet (75 mg total) by mouth daily with breakfast.  90 tablet  2  . furosemide (LASIX) 20 MG tablet Take 1 tablet (20 mg total) by mouth daily.  90  tablet  3  . losartan (COZAAR) 25 MG tablet Take 1 tablet (25 mg total) by mouth daily.  90 tablet  3  . meloxicam (MOBIC) 7.5 MG tablet Take 7.5 mg by mouth 2 (two) times daily.      . nitroGLYCERIN (NITROSTAT) 0.4 MG SL tablet Place 1 tablet (0.4 mg total) under the tongue every 5 (five) minutes as needed for chest pain (up to 3 doses).  25 tablet  3   No current facility-administered medications for this visit.    Allergies  Allergen Reactions  . Lisinopril Swelling    History   Social History  . Marital Status: Single    Spouse Name: N/A    Number of Children: N/A  . Years of Education: N/A   Occupational History  . Not on file.   Social History Main Topics  . Smoking status: Current Every Day Smoker -- 1.00 packs/day for 40 years    Types: Cigarettes  . Smokeless tobacco: Never Used  . Alcohol Use: Yes     Comment: a 40oz or 2 per day  . Drug Use: No  . Sexual Activity:    Other  Topics Concern  . Not on file   Social History Narrative  . No narrative on file    Family History  Problem Relation Age of Onset  . Cancer Mother   . Heart attack Father     Review of Systems:  As stated in the HPI and otherwise negative.   BP 133/97  Pulse 97  Wt 157 lb (71.215 kg)  BMI 26.13 kg/m2  Physical Examination: General: Well developed, well nourished, NAD HEENT: OP clear, mucus membranes moist SKIN: warm, dry. No rashes. Neuro: No focal deficits Musculoskeletal: Muscle strength 5/5 all ext Psychiatric: Mood and affect normal Neck: No JVD, no carotid bruits, no thyromegaly, no lymphadenopathy. Lungs:Clear bilaterally, no wheezes, rhonci, crackles Cardiovascular: Regular rate and rhythm. No murmurs, gallops or rubs. Abdomen:Soft. Bowel sounds present. Non-tender.  Extremities: No lower extremity edema. Pulses are 2 + in the bilateral DP/PT.  Echo: 06/07/12: Left ventricle: The cavity size was normal. Wall thickness was normal. Systolic function was moderately  reduced. The estimated ejection fraction was in the range of 35% to 40%. There is hypokinesis of the entireinferior myocardium. Doppler parameters are consistent with abnormal left ventricular relaxation (grade 1 diastolic dysfunction). - Mitral valve: Calcified annulus. Mild regurgitation.  Cardiac cath 04/11/12: Left main: Ostial 30% stenosis with heavy calcification.  Left Anterior Descending Artery: Large caliber vessel that courses to the apex. The proximal vessel has 30% stenosis. The mid vessel has a 60% stenosis at the takeoff of a septal perforating branch. Moderate caliber diagonal branch with mild plaque disease.  Circumflex Artery: Moderate caliber vessel. The proximal vessel has diffuse 30% stenosis. The first 2 obtuse marginal branches are very small. The third obtuse marginal branch is a small bifurcating vessel with diffuse 90% stenosis involving both branches. (1.75 mm vessel)  Right Coronary Artery: Large, dominant artery with 50% mid stenosis and 100% distal occlusion.  Left Ventricular Angiogram: LVEF=30-35% with akinesis of the inferior wall.  EKG: NSR, rate 97 bpm. LVH. Old inferior infarct.   Assessment and Plan:   1. CAD: Doing well post NSTEMI. He is on ASA/Plavix/statin/ARB, beta blocker. No changes.   2. Ischemic cardiomyopathy: LVEF 35-40%. Will continue medical management.   3. Tobacco abuse: Smoking cessation recommended.

## 2013-01-12 NOTE — Patient Instructions (Addendum)
Your physician wants you to follow-up in:  6 months. You will receive a reminder letter in the mail two months in advance. If you don't receive a letter, please call our office to schedule the follow-up appointment.   

## 2013-07-13 ENCOUNTER — Other Ambulatory Visit: Payer: Self-pay | Admitting: Cardiovascular Disease

## 2013-07-27 ENCOUNTER — Encounter: Payer: Self-pay | Admitting: Cardiovascular Disease

## 2013-07-27 ENCOUNTER — Ambulatory Visit (INDEPENDENT_AMBULATORY_CARE_PROVIDER_SITE_OTHER): Payer: Medicare Other | Admitting: Cardiovascular Disease

## 2013-07-27 VITALS — BP 144/97 | HR 102 | Ht 65.0 in | Wt 162.0 lb

## 2013-07-27 DIAGNOSIS — I10 Essential (primary) hypertension: Secondary | ICD-10-CM

## 2013-07-27 DIAGNOSIS — I2589 Other forms of chronic ischemic heart disease: Secondary | ICD-10-CM | POA: Diagnosis not present

## 2013-07-27 DIAGNOSIS — I2510 Atherosclerotic heart disease of native coronary artery without angina pectoris: Secondary | ICD-10-CM | POA: Diagnosis not present

## 2013-07-27 DIAGNOSIS — F17200 Nicotine dependence, unspecified, uncomplicated: Secondary | ICD-10-CM

## 2013-07-27 DIAGNOSIS — E785 Hyperlipidemia, unspecified: Secondary | ICD-10-CM

## 2013-07-27 DIAGNOSIS — I255 Ischemic cardiomyopathy: Secondary | ICD-10-CM

## 2013-07-27 DIAGNOSIS — Z720 Tobacco use: Secondary | ICD-10-CM

## 2013-07-27 LAB — LIPID PANEL
CHOLESTEROL: 204 mg/dL — AB (ref 0–200)
HDL: 95.3 mg/dL (ref >39.00)
LDL CALC: 86 mg/dL (ref 0–99)
Total CHOL/HDL Ratio: 2
Triglycerides: 116 mg/dL (ref 0.0–149.0)
VLDL: 23.2 mg/dL (ref 0.0–40.0)

## 2013-07-27 LAB — HEPATIC FUNCTION PANEL
ALK PHOS: 39 U/L (ref 39–117)
ALT: 87 U/L — ABNORMAL HIGH (ref 0–53)
AST: 63 U/L — AB (ref 0–37)
Albumin: 4.3 g/dL (ref 3.5–5.2)
BILIRUBIN DIRECT: 0 mg/dL (ref 0.0–0.3)
TOTAL PROTEIN: 7.9 g/dL (ref 6.0–8.3)
Total Bilirubin: 0.6 mg/dL (ref 0.3–1.2)

## 2013-07-27 MED ORDER — CARVEDILOL 6.25 MG PO TABS: 6.2500 mg | ORAL_TABLET | Freq: Two times a day (BID) | ORAL | Status: DC

## 2013-07-27 NOTE — Progress Notes (Signed)
History of Present Illness: 63 yo AAM with history of CAD, CVA in 12/13 here today for cardiac cath. He had his first MI in 2008. He was admitted with bradycardia, chest pain and taken to cath lab as urgent NSTEMI 04/11/12 and was found to have a totally occluded distal RCA. This was treated with a drug eluting stent in the mid to distal RCA and angioplasty of the Posterolateral segment. His LVEF post MI was 35-40%. He continues to smoke.   He is here today for follow up. He tells me that he is feeling well. He denies any chest pain or SOB. Doing well.   Primary Care Physician: Clyda Greener  Last Lipid Profile:Lipid Panel     Component Value Date/Time   CHOL 186 07/13/2012 1351   TRIG 68.0 07/13/2012 1351   HDL 77.60 07/13/2012 1351   CHOLHDL 2 07/13/2012 1351   VLDL 13.6 07/13/2012 1351   LDLCALC 95 07/13/2012 1351     Past Medical History  Diagnosis Date  . Hypertension   . Coronary artery disease     MI in 2008 with PCI to ? vessel; NSTEMI 03/2012; NSTEMI 04/2012 Cath showed triple vessel dz including occluded RCA, s/p DES to distal RCA, PTCA posterolateral branch   . CVA (cerebral infarction)     03/2012 with left arm weakness/numbness and left facial droop.  . Ischemic cardiomyopathy     04/2012 EF 35-40%  . Hyperlipidemia   . Claudication   . Tobacco abuse     quit 04/06/12  . ETOH abuse     quit 04/06/12    Past Surgical History  Procedure Laterality Date  . Hernia repair      Current Outpatient Prescriptions  Medication Sig Dispense Refill  . aspirin 81 MG chewable tablet Chew 1 tablet (81 mg total) by mouth daily.      [name] atorvastatin (LIPITOR) 80 MG tablet Take 1 tablet (80 mg total) by mouth at bedtime.  90 tablet  2  . carvedilol (COREG) 3.125 MG tablet Take 1 tablet (3.125 mg total) by mouth 2 (two) times daily with a meal.  180 tablet  2  . clopidogrel (PLAVIX) 75 MG tablet TAKE ONE TABLET BY MOUTH ONCE DAILY WITH BREAKFAST  90 tablet  0  . furosemide (LASIX) 20  MG tablet Take 1 tablet (20 mg total) by mouth daily.  90 tablet  3  . losartan (COZAAR) 25 MG tablet Take 1 tablet (25 mg total) by mouth daily.  90 tablet  3  . meloxicam (MOBIC) 7.5 MG tablet Take 7.5 mg by mouth 2 (two) times daily.      . nitroGLYCERIN (NITROSTAT) 0.4 MG SL tablet Place 1 tablet (0.4 mg total) under the tongue every 5 (five) minutes as needed for chest pain (up to 3 doses).  25 tablet  3   No current facility-administered medications for this visit.    Allergies  Allergen Reactions  . Lisinopril Swelling    History   Social History  . Marital Status: Single    Spouse Name: N/A    Number of Children: N/A  . Years of Education: N/A   Occupational History  . Not on file.   Social History Main Topics  . Smoking status: Current Every Day Smoker -- 1.00 packs/day for 40 years    Types: Cigarettes  . Smokeless tobacco: Never Used  . Alcohol Use: Yes     Comment: a 40oz or 2 per day  .  Drug Use: No  . Sexual Activity:    Other Topics Concern  . Not on file   Social History Narrative  . No narrative on file    Family History  Problem Relation Age of Onset  . Cancer Mother   . Heart attack Father     Review of Systems:  As stated in the HPI and otherwise negative.   BP 144/97  Pulse 102  Ht 5\' 5"  (1.651 m)  Wt 162 lb (73.483 kg)  BMI 26.96 kg/m2  Physical Examination: General: Well developed, well nourished, NAD HEENT: OP clear, mucus membranes moist SKIN: warm, dry. No rashes. Neuro: No focal deficits Musculoskeletal: Muscle strength 5/5 all ext Psychiatric: Mood and affect normal Neck: No JVD, no carotid bruits, no thyromegaly, no lymphadenopathy. Lungs:Clear bilaterally, no wheezes, rhonci, crackles Cardiovascular: Regular rate and rhythm. No murmurs, gallops or rubs. Abdomen:Soft. Bowel sounds present. Non-tender.  Extremities: No lower extremity edema. Pulses are 2 + in the bilateral DP/PT.  Echo: 06/07/12: Left ventricle: The cavity  size was normal. Wall thickness was normal. Systolic function was moderately reduced. The estimated ejection fraction was in the range of 35% to 40%. There is hypokinesis of the entireinferior myocardium. Doppler parameters are consistent with abnormal left ventricular relaxation (grade 1 diastolic dysfunction). - Mitral valve: Calcified annulus. Mild regurgitation.  Cardiac cath 04/11/12: Left main: Ostial 30% stenosis with heavy calcification.  Left Anterior Descending Artery: Large caliber vessel that courses to the apex. The proximal vessel has 30% stenosis. The mid vessel has a 60% stenosis at the takeoff of a septal perforating branch. Moderate caliber diagonal branch with mild plaque disease.  Circumflex Artery: Moderate caliber vessel. The proximal vessel has diffuse 30% stenosis. The first 2 obtuse marginal branches are very small. The third obtuse marginal branch is a small bifurcating vessel with diffuse 90% stenosis involving both branches. (1.75 mm vessel)  Right Coronary Artery: Large, dominant artery with 50% mid stenosis and 100% distal occlusion.  Left Ventricular Angiogram: LVEF=30-35% with akinesis of the inferior wall.  Assessment and Plan:   1. CAD: Doing well post NSTEMI. He is on ASA/Plavix/statin/ARB, beta blocker. Will increase Coreg to 6.25 mg po BID.   2. Ischemic cardiomyopathy: LVEF 35-40%. Will continue medical management.   3. Tobacco abuse: Smoking cessation recommended. He does not wish to stop  4. HTN: BP is elevated. He has not been taking Coreg. Will start Coreg 6.25 mg po BID.   5. Hyperlipidemia: Continue statin. Check lipids and LFTs today.   He is unsure of which meds he is taking. He has them all in one jar. We have looked them up and Coreg is not in his jar.

## 2013-07-27 NOTE — Patient Instructions (Addendum)
Your physician wants you to follow-up in :6 months.    You will receive a reminder letter in the mail two months in advance. If you don't receive a letter, please call our office to schedule the follow-up appointment.  Your physician has recommended you make the following change in your medication: Take Carvedilol 6.25 mg by mouth twice daily

## 2013-07-30 ENCOUNTER — Other Ambulatory Visit: Payer: Self-pay | Admitting: *Deleted

## 2013-07-30 DIAGNOSIS — I2510 Atherosclerotic heart disease of native coronary artery without angina pectoris: Secondary | ICD-10-CM

## 2013-07-31 ENCOUNTER — Other Ambulatory Visit: Payer: Self-pay | Admitting: *Deleted

## 2013-07-31 DIAGNOSIS — I2510 Atherosclerotic heart disease of native coronary artery without angina pectoris: Secondary | ICD-10-CM

## 2013-07-31 MED ORDER — ATORVASTATIN CALCIUM 80 MG PO TABS: 80.0000 mg | ORAL_TABLET | Freq: Every day | ORAL | Status: DC

## 2013-09-17 ENCOUNTER — Other Ambulatory Visit (INDEPENDENT_AMBULATORY_CARE_PROVIDER_SITE_OTHER): Payer: Medicare Other

## 2013-09-17 DIAGNOSIS — I2510 Atherosclerotic heart disease of native coronary artery without angina pectoris: Secondary | ICD-10-CM | POA: Diagnosis not present

## 2013-09-17 LAB — HEPATIC FUNCTION PANEL
ALBUMIN: 4.1 g/dL (ref 3.5–5.2)
ALK PHOS: 50 U/L (ref 39–117)
ALT: 24 U/L (ref 0–53)
AST: 22 U/L (ref 0–37)
Bilirubin, Direct: 0.1 mg/dL (ref 0.0–0.3)
TOTAL PROTEIN: 7 g/dL (ref 6.0–8.3)
Total Bilirubin: 0.5 mg/dL (ref 0.2–1.2)

## 2013-09-18 ENCOUNTER — Other Ambulatory Visit: Payer: Medicare Other

## 2013-11-14 ENCOUNTER — Other Ambulatory Visit: Payer: Self-pay | Admitting: Cardiovascular Disease

## 2014-02-24 ENCOUNTER — Other Ambulatory Visit: Payer: Self-pay | Admitting: Cardiovascular Disease

## 2014-03-21 ENCOUNTER — Encounter (HOSPITAL_COMMUNITY): Payer: Self-pay | Admitting: Cardiovascular Disease

## 2014-04-18 ENCOUNTER — Encounter: Payer: Self-pay | Admitting: Cardiovascular Disease

## 2014-04-18 ENCOUNTER — Encounter (INDEPENDENT_AMBULATORY_CARE_PROVIDER_SITE_OTHER): Payer: Self-pay

## 2014-04-18 ENCOUNTER — Ambulatory Visit (INDEPENDENT_AMBULATORY_CARE_PROVIDER_SITE_OTHER): Payer: Medicare Other | Admitting: Cardiovascular Disease

## 2014-04-18 VITALS — BP 106/72 | HR 79 | Ht 65.0 in | Wt 168.1 lb

## 2014-04-18 DIAGNOSIS — I2510 Atherosclerotic heart disease of native coronary artery without angina pectoris: Secondary | ICD-10-CM

## 2014-04-18 DIAGNOSIS — Z720 Tobacco use: Secondary | ICD-10-CM

## 2014-04-18 DIAGNOSIS — E785 Hyperlipidemia, unspecified: Secondary | ICD-10-CM | POA: Diagnosis not present

## 2014-04-18 DIAGNOSIS — I10 Essential (primary) hypertension: Secondary | ICD-10-CM

## 2014-04-18 DIAGNOSIS — I255 Ischemic cardiomyopathy: Secondary | ICD-10-CM

## 2014-04-18 MED ORDER — LOSARTAN POTASSIUM 25 MG PO TABS: 25.0000 mg | ORAL_TABLET | Freq: Every day | ORAL | Status: DC

## 2014-04-18 MED ORDER — CARVEDILOL 6.25 MG PO TABS: 6.2500 mg | ORAL_TABLET | Freq: Two times a day (BID) | ORAL | Status: DC

## 2014-04-18 MED ORDER — CLOPIDOGREL BISULFATE 75 MG PO TABS: 75.0000 mg | ORAL_TABLET | Freq: Every day | ORAL | Status: DC

## 2014-04-18 NOTE — Progress Notes (Signed)
History of Present Illness: 64 yo AAM with history of CAD, CVA in 12/13 here today for cardiac follow up. He had his first MI in 2008. He was admitted with bradycardia, chest pain and taken to cath lab as urgent NSTEMI 04/11/12 and was found to have a totally occluded distal RCA. This was treated with a drug eluting stent in the mid to distal RCA and angioplasty of the Posterolateral segment. His LVEF post MI was 35-40%. He continues to smoke.   He is here today for follow up. He tells me that he is feeling well. He has occasional chest pain but this is mostly dull and lasts for 24 hours, occurs at rest. No exertional chest pain. No SOB. Out of Cozaar.   Primary Care Physician: Clyda Greener  Last Lipid Profile:Lipid Panel     Component Value Date/Time   CHOL 204* 07/27/2013 1524   TRIG 116.0 07/27/2013 1524   HDL 95.30 07/27/2013 1524   CHOLHDL 2 07/27/2013 1524   VLDL 23.2 07/27/2013 1524   LDLCALC 86 07/27/2013 1524     Past Medical History  Diagnosis Date  . Hypertension   . Coronary artery disease     MI in 2008 with PCI to ? vessel; NSTEMI 03/2012; NSTEMI 04/2012 Cath showed triple vessel dz including occluded RCA, s/p DES to distal RCA, PTCA posterolateral branch   . CVA (cerebral infarction)     03/2012 with left arm weakness/numbness and left facial droop.  . Ischemic cardiomyopathy     04/2012 EF 35-40%  . Hyperlipidemia   . Claudication   . Tobacco abuse     quit 04/06/12  . ETOH abuse     quit 04/06/12    Past Surgical History  Procedure Laterality Date  . Hernia repair    . Left heart catheterization with coronary angiogram N/A 04/11/2012    Procedure: LEFT HEART CATHETERIZATION WITH CORONARY ANGIOGRAM;  Surgeon: Kathleene Hazel, MD;  Location: [facility] CATH LAB;  Service: Cardiovascular;  Laterality: N/A;  . Percutaneous coronary stent intervention (pci-s)  04/11/2012    Procedure: PERCUTANEOUS CORONARY STENT INTERVENTION (PCI-S);  Surgeon: Kathleene Hazel, MD;  Location: [facility] CATH LAB;  Service: Cardiovascular;;  Distal RCA  . Percutaneous coronary intervention-balloon only  04/11/2012    Procedure: PERCUTANEOUS CORONARY INTERVENTION-BALLOON ONLY;  Surgeon: Kathleene Hazel, MD;  Location: [facility] CATH LAB;  Service: Cardiovascular;;  RPLA    Current Outpatient Prescriptions  Medication Sig Dispense Refill  . aspirin 81 MG chewable tablet Chew 1 tablet (81 mg total) by mouth daily.    [name] atorvastatin (LIPITOR) 80 MG tablet TAKE ONE TABLET BY MOUTH ONCE DAILY AT BEDTIME 90 tablet 0  . carvedilol (COREG) 6.25 MG tablet Take 1 tablet (6.25 mg total) by mouth 2 (two) times daily with a meal. 180 tablet 2  . clopidogrel (PLAVIX) 75 MG tablet TAKE ONE TABLET BY MOUTH ONCE DAILY WITH  BREAKFAST  (NEEDS  TO  CONTACT  OFFCIE  TO  SCHEDULE  APPOINTMENT  FOR  FUTURE  REFILLS) 90 tablet 0  . furosemide (LASIX) 20 MG tablet TAKE ONE TABLET BY MOUTH ONCE DAILY 90 tablet 0  . losartan (COZAAR) 25 MG tablet TAKE ONE TABLET BY MOUTH ONCE DAILY 90 tablet 0  . meloxicam (MOBIC) 7.5 MG tablet Take 7.5 mg by mouth 2 (two) times daily.    . nitroGLYCERIN (NITROSTAT) 0.4 MG SL tablet Place 1 tablet (0.4 mg total) under the tongue every 5 (five)  minutes as needed for chest pain (up to 3 doses). 25 tablet 3   No current facility-administered medications for this visit.    Allergies  Allergen Reactions  . Lisinopril Swelling    History   Social History  . Marital Status: Single    Spouse Name: N/A    Number of Children: N/A  . Years of Education: N/A   Occupational History  . Not on file.   Social History Main Topics  . Smoking status: Current Every Day Smoker -- 1.00 packs/day for 40 years    Types: Cigarettes  . Smokeless tobacco: Never Used  . Alcohol Use: Yes     Comment: a 40oz or 2 per day  . Drug Use: No  . Sexual Activity: Not on file   Other Topics Concern  . Not on file   Social History Narrative    Family History  Problem  Relation Age of Onset  . Cancer Mother   . Heart attack Father     Review of Systems:  As stated in the HPI and otherwise negative.   BP 106/72 mmHg  Pulse 79  Ht  (1.651 m)  Wt 168 lb 1.9 oz (76.259 kg)  BMI 27.98 kg/m2  SpO2 96%  Physical Examination: General: Well developed, well nourished, NAD HEENT: OP clear, mucus membranes moist SKIN: warm, dry. No rashes. Neuro: No focal deficits Musculoskeletal: Muscle strength 5/5 all ext Psychiatric: Mood and affect normal Neck: No JVD, no carotid bruits, no thyromegaly, no lymphadenopathy. Lungs:Clear bilaterally, no wheezes, rhonci, crackles Cardiovascular: Regular rate and rhythm. No murmurs, gallops or rubs. Abdomen:Soft. Bowel sounds present. Non-tender.  Extremities: No lower extremity edema. Pulses are 2 + in the bilateral DP/PT.  Echo: 06/07/12: Left ventricle: The cavity size was normal. Wall thickness was normal. Systolic function was moderately reduced. The estimated ejection fraction was in the range of 35% to 40%. There is hypokinesis of the entireinferior myocardium. Doppler parameters are consistent with abnormal left ventricular relaxation (grade 1 diastolic dysfunction). - Mitral valve: Calcified annulus. Mild regurgitation.  Cardiac cath 04/11/12: Left main: Ostial 30% stenosis with heavy calcification.  Left Anterior Descending Artery: Large caliber vessel that courses to the apex. The proximal vessel has 30% stenosis. The mid vessel has a 60% stenosis at the takeoff of a septal perforating branch. Moderate caliber diagonal branch with mild plaque disease.  Circumflex Artery: Moderate caliber vessel. The proximal vessel has diffuse 30% stenosis. The first 2 obtuse marginal branches are very small. The third obtuse marginal branch is a small bifurcating vessel with diffuse 90% stenosis involving both branches. (1.75 mm vessel)  Right Coronary Artery: Large, dominant artery with 50% mid stenosis and 100% distal  occlusion.  Left Ventricular Angiogram: LVEF=30-35% with akinesis of the inferior wall.  EKG: NSR, rate 79 bpm. Inferior Q-waves. T wave inversions diffusely (unchanged)  Assessment and Plan:   1. CAD: Stable. He is on ASA/Plavix/statin/ARB, beta blocker.   2. Ischemic cardiomyopathy: LVEF 35-40%. Will continue medical management.   3. Tobacco abuse: Smoking cessation recommended. He wishes to stop  4. HTN: BP is controlled. No changes. Will refill Cozaar today  5. Hyperlipidemia: Continue statin.

## 2014-04-18 NOTE — Patient Instructions (Signed)
Your physician wants you to follow-up in:  6 months. You will receive a reminder letter in the mail two months in advance. If you don't receive a letter, please call our office to schedule the follow-up appointment.   

## 2014-06-14 ENCOUNTER — Other Ambulatory Visit: Payer: Self-pay | Admitting: Cardiovascular Disease

## 2014-07-26 ENCOUNTER — Emergency Department (HOSPITAL_COMMUNITY): Payer: Medicare Other

## 2014-07-26 ENCOUNTER — Emergency Department (HOSPITAL_COMMUNITY)
Admission: EM | Admit: 2014-07-26 | Discharge: 2014-07-26 | Disposition: A | Discharge status: Home / Self Care | Payer: Medicare Other | Attending: Emergency Medicine | Admitting: Emergency Medicine

## 2014-07-26 ENCOUNTER — Encounter (HOSPITAL_COMMUNITY): Payer: Self-pay

## 2014-07-26 DIAGNOSIS — I2510 Atherosclerotic heart disease of native coronary artery without angina pectoris: Secondary | ICD-10-CM | POA: Diagnosis not present

## 2014-07-26 DIAGNOSIS — Z720 Tobacco use: Secondary | ICD-10-CM | POA: Insufficient documentation

## 2014-07-26 DIAGNOSIS — E785 Hyperlipidemia, unspecified: Secondary | ICD-10-CM | POA: Insufficient documentation

## 2014-07-26 DIAGNOSIS — Z7982 Long term (current) use of aspirin: Secondary | ICD-10-CM | POA: Diagnosis not present

## 2014-07-26 DIAGNOSIS — Z791 Long term (current) use of non-steroidal anti-inflammatories (NSAID): Secondary | ICD-10-CM | POA: Diagnosis not present

## 2014-07-26 DIAGNOSIS — I10 Essential (primary) hypertension: Secondary | ICD-10-CM | POA: Insufficient documentation

## 2014-07-26 DIAGNOSIS — M549 Dorsalgia, unspecified: Secondary | ICD-10-CM

## 2014-07-26 DIAGNOSIS — M5136 Other intervertebral disc degeneration, lumbar region: Secondary | ICD-10-CM | POA: Diagnosis not present

## 2014-07-26 DIAGNOSIS — Z7902 Long term (current) use of antithrombotics/antiplatelets: Secondary | ICD-10-CM | POA: Diagnosis not present

## 2014-07-26 DIAGNOSIS — M545 Low back pain: Secondary | ICD-10-CM | POA: Insufficient documentation

## 2014-07-26 DIAGNOSIS — Z79899 Other long term (current) drug therapy: Secondary | ICD-10-CM | POA: Insufficient documentation

## 2014-07-26 DIAGNOSIS — Z9889 Other specified postprocedural states: Secondary | ICD-10-CM | POA: Insufficient documentation

## 2014-07-26 DIAGNOSIS — Z8673 Personal history of transient ischemic attack (TIA), and cerebral infarction without residual deficits: Secondary | ICD-10-CM | POA: Diagnosis not present

## 2014-07-26 MED ORDER — METHOCARBAMOL 500 MG PO TABS: 500.0000 mg | ORAL_TABLET | Freq: Four times a day (QID) | ORAL | Status: DC

## 2014-07-26 MED ORDER — HYDROCODONE-ACETAMINOPHEN 5-325 MG PO TABS: ORAL_TABLET | ORAL | Status: DC

## 2014-07-26 NOTE — ED Provider Notes (Signed)
CSN: [id number]     Arrival date & time 07/26/14  1157 History  This chart was scribed for non-physician practitioner, Renne Crigler, PA-C working with Samuel Jester, DO by Gwenyth Ober, ED scribe. This patient was seen in room TR08C/TR08C and the patient's care was started at 12:31 PM  CC: back pain   The history is provided by the patient. No language interpreter was used.   HPI Comments: Randall Patel is a 64 y.o. male who presents to the Emergency Department complaining of constant, dull, moderate lower back pain that wraps around to his right flank and started 2 days ago. Pt reports pain becomes worse with movement. He has tried an unspecificied pain medication with some relief.  Pt denies a history of back pain. He denies recent injuries or falls, anticoagulant use, history of prostate problem and IV drug use. Pt also denies bladder incontinence, bowel incontinence, hematuria, abdominal pain, CP, weakness, fever and unexplained weight loss as associated symptoms. No cancer history.   Past Medical History  Diagnosis Date  . Hypertension   . Coronary artery disease     MI in 2008 with PCI to ? vessel; NSTEMI 03/2012; NSTEMI 04/2012 Cath showed triple vessel dz including occluded RCA, s/p DES to distal RCA, PTCA posterolateral branch   . CVA (cerebral infarction)     03/2012 with left arm weakness/numbness and left facial droop.  . Ischemic cardiomyopathy     04/2012 EF 35-40%  . Hyperlipidemia   . Claudication   . Tobacco abuse     quit 04/06/12  . ETOH abuse     quit 04/06/12   Past Surgical History  Procedure Laterality Date  . Hernia repair    . Left heart catheterization with coronary angiogram N/A 04/11/2012    Procedure: LEFT HEART CATHETERIZATION WITH CORONARY ANGIOGRAM;  Surgeon: Kathleene Hazel, MD;  Location: [facility] CATH LAB;  Service: Cardiovascular;  Laterality: N/A;  . Percutaneous coronary stent intervention (pci-s)  04/11/2012    Procedure: PERCUTANEOUS  CORONARY STENT INTERVENTION (PCI-S);  Surgeon: Kathleene Hazel, MD;  Location: [facility] CATH LAB;  Service: Cardiovascular;;  Distal RCA  . Percutaneous coronary intervention-balloon only  04/11/2012    Procedure: PERCUTANEOUS CORONARY INTERVENTION-BALLOON ONLY;  Surgeon: Kathleene Hazel, MD;  Location: [facility] CATH LAB;  Service: Cardiovascular;;  RPLA   Family History  Problem Relation Age of Onset  . Cancer Mother   . Heart attack Father    History  Substance Use Topics  . Smoking status: Current Every Day Smoker -- 1.00 packs/day for 40 years    Types: Cigarettes  . Smokeless tobacco: Never Used  . Alcohol Use: Yes     Comment: a 40oz or 2 per day    Review of Systems  Constitutional: Negative for fever and unexpected weight change.  Cardiovascular: Negative for chest pain.  Gastrointestinal: Negative for abdominal pain and constipation.       Neg for fecal incontinence  Genitourinary: Negative for hematuria, flank pain and difficulty urinating.       Negative for urinary incontinence or retention  Musculoskeletal: Positive for back pain.  Neurological: Negative for weakness and numbness.       Negative for saddle paresthesias     Allergies  Lisinopril  Home Medications   Prior to Admission medications   Medication Sig Start Date End Date Taking? Authorizing Provider  aspirin 81 MG chewable tablet Chew 1 tablet (81 mg total) by mouth daily. 04/14/12   Guadlupe Spanish Hope, PA-C  atorvastatin (LIPITOR) 80 MG tablet TAKE ONE TABLET BY MOUTH ONCE DAILY AT BEDTIME 06/14/14   Kathleene Hazel, MD  carvedilol (COREG) 6.25 MG tablet Take 1 tablet (6.25 mg total) by mouth 2 (two) times daily with a meal. 04/18/14   Kathleene Hazel, MD  clopidogrel (PLAVIX) 75 MG tablet Take 1 tablet (75 mg total) by mouth daily. 04/18/14   Kathleene Hazel, MD  furosemide (LASIX) 20 MG tablet TAKE ONE TABLET BY MOUTH ONCE DAILY 06/14/14   Kathleene Hazel, MD  losartan (COZAAR) 25 MG  tablet Take 1 tablet (25 mg total) by mouth daily. 04/18/14   Kathleene Hazel, MD  meloxicam (MOBIC) 7.5 MG tablet Take 7.5 mg by mouth 2 (two) times daily.    Historical Provider, MD  nitroGLYCERIN (NITROSTAT) 0.4 MG SL tablet Place 1 tablet (0.4 mg total) under the tongue every 5 (five) minutes as needed for chest pain (up to 3 doses). 04/14/12   Jessica A Hope, PA-C   BP 158/96 mmHg  Pulse 63  Temp(Src) 98.2 F (36.8 C) (Oral)  Resp 18  Ht  (1.651 m)  Wt 168 lb (76.204 kg)  BMI 27.96 kg/m2  SpO2 98%   Physical Exam  Constitutional: He appears well-developed and well-nourished. No distress.  HENT:  Head: Normocephalic and atraumatic.  Eyes: Conjunctivae and EOM are normal.  Neck: Normal range of motion. Neck supple. No tracheal deviation present.  Cardiovascular: Normal rate and regular rhythm.   Pulses:      Radial pulses are 2+ on the right side, and 2+ on the left side.  Pulmonary/Chest: Effort normal. No respiratory distress.  Abdominal: Soft. He exhibits no pulsatile midline mass. There is no tenderness. There is no CVA tenderness.  Musculoskeletal: Normal range of motion. He exhibits no tenderness.       Cervical back: He exhibits normal range of motion, no tenderness and no bony tenderness.       Thoracic back: He exhibits normal range of motion, no tenderness and no bony tenderness.       Lumbar back: He exhibits pain. He exhibits normal range of motion, no tenderness and no bony tenderness.       Back:  No step-off noted with palpation of spine. No tenderness to percussion of L-spine. No paravertebral tenderness.   Neurological: He is alert. He has normal reflexes. No sensory deficit. He exhibits normal muscle tone.  5/5 strength in entire lower extremities bilaterally. No sensation deficit.   Skin: Skin is warm and dry.  Psychiatric: He has a normal mood and affect. His behavior is normal.  Nursing note and vitals reviewed.   ED Course  Procedures    DIAGNOSTIC STUDIES: Oxygen Saturation is 98% on RA, normal by my interpretation.    COORDINATION OF CARE: 12:36 PM Discussed treatment plan with pt which includes an x-ray of lumbar spine. Pt agreed to plan.   Labs Review Labs Reviewed - No data to display  Imaging Review No results found.   EKG Interpretation None       Patient seen and examined. Work-up initiated.   Vital signs reviewed and are as follows: Filed Vitals:   07/26/14 1353  BP: 152/103  Pulse: 63  Temp: 97.9 F (36.6 C)  Resp: 16   X-ray neg. PT informed. Encouraged PCP follow-up if not improved with basic treatments in 1-2 weeks.  No red flag s/s of low back pain. Patient was counseled on back pain precautions and told to  do activity as tolerated but do not lift, push, or pull heavy objects more than 10 pounds for the next week.  Patient counseled to use ice or heat on back for no longer than 15 minutes every hour.   Patient prescribed muscle relaxer and counseled on proper use of muscle relaxant medication.    Patient prescribed narcotic pain medicine and counseled on proper use of narcotic pain medications. Counseled not to combine this medication with others containing tylenol.   Urged patient not to drink alcohol, drive, or perform any other activities that requires focus while taking either of these medications.  Patient urged to follow-up with PCP if pain does not improve with treatment and rest or if pain becomes recurrent. Urged to return with worsening severe pain, loss of bowel or bladder control, trouble walking.   The patient verbalizes understanding and agrees with the plan.     MDM   Final diagnoses:  Back pain   Patient with back pain x 2 days. No injury. No neurological deficits. X-ray done 2/2 age. No remarkable findings. Patient is ambulatory. No warning symptoms of back pain including: fecal incontinence, urinary retention or overflow incontinence, night sweats, waking from  sleep with back pain, unexplained fevers or weight loss, h/o cancer, IVDU, recent trauma. No concern for cauda equina, epidural abscess, or other serious cause of back pain. No abdominal pain or flank pain to suggest AAA. Conservative measures such as rest, ice/heat and pain medicine indicated with PCP follow-up if no improvement with conservative management.   I personally performed the services described in this documentation, which was scribed in my presence. The recorded information has been reviewed and is accurate.    Renne Crigler, PA-C 07/26/14 1358  Samuel Jester, DO 07/27/14 [phone]

## 2014-07-26 NOTE — ED Notes (Signed)
Pt presents with 3 day h/o low back pain.  Pt denies any injury, reports pain radiates from low back to R flank.  Pt reports with lying supine, pain lessens, reports increases with movement.  Pt denies any dysuria, denies any bowel/bladder incontinence.  Pt reports taking sister's pain medication which eased pain.

## 2014-07-26 NOTE — Discharge Instructions (Signed)
Please read and follow all provided instructions.  Your diagnoses today include:  1. Back pain     Tests performed today include:  Vital signs - see below for your results today  Medications prescribed:   Vicodin (hydrocodone/acetaminophen) - narcotic pain medication  DO NOT drive or perform any activities that require you to be awake and alert because this medicine can make you drowsy. BE VERY CAREFUL not to take multiple medicines containing Tylenol (also called acetaminophen). Doing so can lead to an overdose which can damage your liver and cause liver failure and possibly death.   Robaxin (methocarbamol) - muscle relaxer medication  DO NOT drive or perform any activities that require you to be awake and alert because this medicine can make you drowsy.   Take any prescribed medications only as directed.  Home care instructions:   Follow any educational materials contained in this packet  Please rest, use ice or heat on your back for the next several days  Do not lift, push, pull anything more than 10 pounds for the next week  Follow-up instructions: Please follow-up with your primary care provider in the next 1-2 weeks for further evaluation of your symptoms.   Return instructions:  SEEK IMMEDIATE MEDICAL ATTENTION IF YOU HAVE:  New numbness, tingling, weakness, or problem with the use of your arms or legs  Severe back pain not relieved with medications  Loss control of your bowels or bladder  Increasing pain in any areas of the body (such as chest or abdominal pain)  Shortness of breath, dizziness, or fainting.   Worsening nausea (feeling sick to your stomach), vomiting, fever, or sweats  Any other emergent concerns regarding your health   Additional Information:  Your vital signs today were: BP 158/96 mmHg   Pulse 63   Temp(Src) 98.2 F (36.8 C) (Oral)   Resp 18   Ht 5\' 5"  (1.651 m)   Wt 168 lb (76.204 kg)   BMI 27.96 kg/m2   SpO2 98% If your blood  pressure (BP) was elevated above 135/85 this visit, please have this repeated by your doctor within one month. --------------

## 2015-01-20 ENCOUNTER — Other Ambulatory Visit: Payer: Self-pay | Admitting: Cardiovascular Disease

## 2015-01-28 NOTE — Progress Notes (Signed)
Cardiology Office Note   Date:  01/29/2015   ID:  Randall Patel, DOB 1950-12-18, MRN [id number]  PCP:  [name], MD  Cardiologist:  Dr. Verne Carrow   Electrophysiologist:  n/a  Chief Complaint  Patient presents with  . Follow-up  . Coronary Artery Disease  . Cardiomyopathy     History of Present Illness: Randall Patel is a 64 y.o. male with a hx of CAD, ischemic cardiomyopathy prior CVA, HTN, HL. He suffered his first myocardial infarction in 2008. He was admitted in 12/13 with a non-STEMI. LHC demonstrated totally occluded distal RCA which was treated with a DES and POBA of a PL segment. EF has remained depressed at 35-40%. Last seen by Dr. Clifton James 1/16.    Studies/Reports Reviewed Today:  Echo 2/14 EF 35-40%, inferior HK, grade 1 diastolic dysfunction, MAC, mild MR  Carotid US 12/13 No sig ICA stenosis  LHC 12/13 LM: Ostial 30% LAD: Proximal 30%, mid 60% LCx: Proximal 30%, small bifurcating OM3 with diffuse 90% involving both branches RCA: 50% mid, 100% distal EF 30-35%, inferior akinesis PCI:   2.5 x 20 mm Promus Premier DES in the distal RCA, balloon angioplasty of a PL branch   Past Medical History  Diagnosis Date  . Hypertension   . Coronary artery disease     MI in 2008 with PCI to ? vessel; NSTEMI 03/2012; NSTEMI 04/2012 Cath showed triple vessel dz including occluded RCA, s/p DES to distal RCA, PTCA posterolateral branch   . CVA (cerebral infarction)     03/2012 with left arm weakness/numbness and left facial droop.  . Ischemic cardiomyopathy     04/2012 EF 35-40%  . Hyperlipidemia   . Claudication (HCC)   . Tobacco abuse     quit 04/06/12  . ETOH abuse     quit 04/06/12    Past Surgical History  Procedure Laterality Date  . Hernia repair    . Left heart catheterization with coronary angiogram N/A 04/11/2012    Procedure: LEFT HEART CATHETERIZATION WITH CORONARY ANGIOGRAM;  Surgeon: Kathleene Hazel, MD;  Location:  [facility] CATH LAB;  Service: Cardiovascular;  Laterality: N/A;  . Percutaneous coronary stent intervention (pci-s)  04/11/2012    Procedure: PERCUTANEOUS CORONARY STENT INTERVENTION (PCI-S);  Surgeon: Kathleene Hazel, MD;  Location: [facility] CATH LAB;  Service: Cardiovascular;;  Distal RCA  . Percutaneous coronary intervention-balloon only  04/11/2012    Procedure: PERCUTANEOUS CORONARY INTERVENTION-BALLOON ONLY;  Surgeon: Kathleene Hazel, MD;  [facility];  Service: Cardiovascular;;  RPLA     Current Outpatient Prescriptions  Medication Sig Dispense Refill  . aspirin 81 MG chewable tablet Chew 1 tablet (81 mg total) by mouth daily.    [name] atorvastatin (LIPITOR) 80 MG tablet TAKE ONE TABLET BY MOUTH ONCE DAILY AT BEDTIME 14 tablet 0  . carvedilol (COREG) 6.25 MG tablet Take 1 tablet (6.25 mg total) by mouth 2 (two) times daily with a meal. 180 tablet 3  . clopidogrel (PLAVIX) 75 MG tablet Take 1 tablet (75 mg total) by mouth daily. 90 tablet 3  . furosemide (LASIX) 20 MG tablet TAKE ONE TABLET BY MOUTH ONCE DAILY 14 tablet 0  . HYDROcodone-acetaminophen (NORCO/VICODIN) 5-325 MG per tablet Take 1-2 tablets every 6 hours as needed for severe pain 10 tablet 0  . losartan (COZAAR) 25 MG tablet Take 1 tablet (25 mg total) by mouth daily. 90 tablet 3  . meloxicam (MOBIC) 7.5 MG tablet Take 7.5 mg by mouth 2 (two)  times daily.    . methocarbamol (ROBAXIN) 500 MG tablet Take 1 tablet (500 mg total) by mouth 4 (four) times daily. 20 tablet 0  . nitroGLYCERIN (NITROSTAT) 0.4 MG SL tablet Place 1 tablet (0.4 mg total) under the tongue every 5 (five) minutes as needed for chest pain (up to 3 doses). 25 tablet 3   No current facility-administered medications for this visit.    Allergies:   Lisinopril    Social History:  The patient  reports that he has been smoking Cigarettes.  He has a 40 pack-year smoking history. He has never used smokeless tobacco. He reports that he drinks alcohol. He  reports that he does not use illicit drugs.   Family History:  The patient's family history includes Cancer in his mother; Heart attack in his father.    ROS:   Please see the history of present illness.   ROS    PHYSICAL EXAM: VS:  There were no vitals taken for this visit.    Wt Readings from Last 3 Encounters:  07/26/14 168 lb (76.204 kg)  04/18/14 168 lb 1.9 oz (76.259 kg)  07/27/13 162 lb (73.483 kg)     GEN: Well nourished, well developed, in no acute distress HEENT: normal Neck: no JVD, no carotid bruits, no masses Cardiac:  Normal S1/S2, RRR; no murmur ,  no rubs or gallops, no edema  Respiratory:  clear to auscultation bilaterally, no wheezing, rhonchi or rales. GI: soft, nontender, nondistended, + BS MS: no deformity or atrophy Skin: warm and dry  Neuro:  CNs II-XII intact, Strength and sensation are intact Psych: Normal affect   EKG:  EKG is ordered today.  It demonstrates:      Recent Labs: No results found for requested labs within last 365 days.    Lipid Panel    Component Value Date/Time   CHOL 204* 07/27/2013 1524   TRIG 116.0 07/27/2013 1524   HDL 95.30 07/27/2013 1524   CHOLHDL 2 07/27/2013 1524   VLDL 23.2 07/27/2013 1524   LDLCALC 86 07/27/2013 1524      ASSESSMENT AND PLAN:  1. CAD:  S/p MI in 2008 and NSTEMI in 2013 tx with DES to RCA and POBA to PL branch.    2. Ischemic Cardiomyopathy:  EF 35-40%.    3. HTN:    4. Hyperlipidemia:    5. Tobacco Abuse:       Medication Changes: Current medicines are reviewed at length with the patient today.  Concerns regarding medicines are as outlined above.  The following changes have been made:   Discontinued Medications   No medications on file   Modified Medications   No medications on file   New Prescriptions   No medications on file   Labs/ tests ordered today include:   No orders of the defined types were placed in this encounter.      Disposition:    FU with      Signed, Tereso Newcomer, PA-C, MHS 01/29/2015 1:49 PM    [facility] [address] [place], [place], [place]  [address] Phone: [phone]; Fax: [phone]    This encounter was created in error - please disregard.

## 2015-01-29 ENCOUNTER — Encounter: Payer: Medicare Other | Admitting: Physician Assistant

## 2015-02-12 ENCOUNTER — Encounter: Payer: Medicare Other | Admitting: Physician Assistant

## 2015-02-12 NOTE — Progress Notes (Signed)
Cardiology Office Note   Date:  02/12/2015   ID:  Randall Patel, DOB 1950-11-07, MRN [id number]  PCP:  [name], MD  Cardiologist:  Dr. Verne Carrow   Electrophysiologist:  n/a  No chief complaint on file.    History of Present Illness: Randall Patel is a 64 y.o. male with a hx of CAD, ischemic cardiomyopathy prior CVA, HTN, HL. He suffered his first myocardial infarction in 2008. He was admitted in 12/13 with a non-STEMI. LHC demonstrated totally occluded distal RCA which was treated with a DES and POBA of a PL segment. EF has remained depressed at 35-40%. Last seen by Dr. Clifton James 1/16.  Returns for FU.     Studies/Reports Reviewed Today:  Echo 2/14 EF 35-40%, inferior HK, grade 1 diastolic dysfunction, MAC, mild MR  Carotid US 12/13 No sig ICA stenosis  LHC 12/13 LM: Ostial 30% LAD: Proximal 30%, mid 60% LCx: Proximal 30%, small bifurcating OM3 with diffuse 90% involving both branches RCA: 50% mid, 100% distal EF 30-35%, inferior akinesis PCI: 2.5 x 20 mm Promus Premier DES in the distal RCA, balloon angioplasty of a PL branch   Past Medical History  Diagnosis Date  . Hypertension   . Coronary artery disease     MI in 2008 with PCI to ? vessel; NSTEMI 03/2012; NSTEMI 04/2012 Cath showed triple vessel dz including occluded RCA, s/p DES to distal RCA, PTCA posterolateral branch   . CVA (cerebral infarction)     03/2012 with left arm weakness/numbness and left facial droop.  . Ischemic cardiomyopathy     04/2012 EF 35-40%  . Hyperlipidemia   . Claudication (HCC)   . Tobacco abuse     quit 04/06/12  . ETOH abuse     quit 04/06/12    Past Surgical History  Procedure Laterality Date  . Hernia repair    . Left heart catheterization with coronary angiogram N/A 04/11/2012    Procedure: LEFT HEART CATHETERIZATION WITH CORONARY ANGIOGRAM;  Surgeon: Kathleene Hazel, MD;  Location: [facility] CATH LAB;  Service: Cardiovascular;  Laterality: N/A;    . Percutaneous coronary stent intervention (pci-s)  04/11/2012    Procedure: PERCUTANEOUS CORONARY STENT INTERVENTION (PCI-S);  Surgeon: Kathleene Hazel, MD;  Location: [facility] CATH LAB;  Service: Cardiovascular;;  Distal RCA  . Percutaneous coronary intervention-balloon only  04/11/2012    Procedure: PERCUTANEOUS CORONARY INTERVENTION-BALLOON ONLY;  Surgeon: Kathleene Hazel, MD;  Location: [facility] CATH LAB;  Service: Cardiovascular;;  RPLA     Current Outpatient Prescriptions  Medication Sig Dispense Refill  . aspirin 81 MG chewable tablet Chew 1 tablet (81 mg total) by mouth daily.    [name] atorvastatin (LIPITOR) 80 MG tablet TAKE ONE TABLET BY MOUTH ONCE DAILY AT BEDTIME 14 tablet 0  . carvedilol (COREG) 6.25 MG tablet Take 1 tablet (6.25 mg total) by mouth 2 (two) times daily with a meal. 180 tablet 3  . clopidogrel (PLAVIX) 75 MG tablet Take 1 tablet (75 mg total) by mouth daily. 90 tablet 3  . furosemide (LASIX) 20 MG tablet TAKE ONE TABLET BY MOUTH ONCE DAILY 14 tablet 0  . HYDROcodone-acetaminophen (NORCO/VICODIN) 5-325 MG per tablet Take 1-2 tablets every 6 hours as needed for severe pain 10 tablet 0  . losartan (COZAAR) 25 MG tablet Take 1 tablet (25 mg total) by mouth daily. 90 tablet 3  . meloxicam (MOBIC) 7.5 MG tablet Take 7.5 mg by mouth 2 (two) times daily.    . methocarbamol (ROBAXIN) 500  MG tablet Take 1 tablet (500 mg total) by mouth 4 (four) times daily. 20 tablet 0  . nitroGLYCERIN (NITROSTAT) 0.4 MG SL tablet Place 1 tablet (0.4 mg total) under the tongue every 5 (five) minutes as needed for chest pain (up to 3 doses). 25 tablet 3   No current facility-administered medications for this visit.    Allergies:   Lisinopril    Social History:   Social History   Social History  . Marital Status: Single    Spouse Name: N/A  . Number of Children: N/A  . Years of Education: N/A   Social History Main Topics  . Smoking status: Current Every Day Smoker -- 1.00  packs/day for 40 years    Types: Cigarettes  . Smokeless tobacco: Never Used  . Alcohol Use: Yes     Comment: a 40oz or 2 per day  . Drug Use: No  . Sexual Activity: Not on file   Other Topics Concern  . Not on file   Social History Narrative     Family History:   Family History  Problem Relation Age of Onset  . Cancer Mother   . Heart attack Father       ROS:   Please see the history of present illness.   ROS    PHYSICAL EXAM: VS:  There were no vitals taken for this visit.    Wt Readings from Last 3 Encounters:  07/26/14 168 lb (76.204 kg)  04/18/14 168 lb 1.9 oz (76.259 kg)  07/27/13 162 lb (73.483 kg)     GEN: Well nourished, well developed, in no acute distress HEENT: normal Neck: no JVD, no carotid bruits, no masses Cardiac:  Normal S1/S2, RRR; no murmur ,  no rubs or gallops, no edema  Respiratory:  clear to auscultation bilaterally, no wheezing, rhonchi or rales. GI: soft, nontender, nondistended, + BS MS: no deformity or atrophy Skin: warm and dry  Neuro:  CNs II-XII intact, Strength and sensation are intact Psych: Normal affect   EKG:  EKG is ordered today.  It demonstrates:      Recent Labs: No results found for requested labs within last 365 days.    Lipid Panel    Component Value Date/Time   CHOL 204* 07/27/2013 1524   TRIG 116.0 07/27/2013 1524   HDL 95.30 07/27/2013 1524   CHOLHDL 2 07/27/2013 1524   VLDL 23.2 07/27/2013 1524   LDLCALC 86 07/27/2013 1524      ASSESSMENT AND PLAN:  1. CAD: S/p MI in 2008 and NSTEMI in 2013 tx with DES to RCA and POBA to PL branch.   2. Ischemic Cardiomyopathy: EF 35-40%.   3. HTN:   4. Hyperlipidemia:   5. Tobacco Abuse:      Medication Changes: Current medicines are reviewed at length with the patient today.  Concerns regarding medicines are as outlined above.  The following changes have been made:   Discontinued Medications   No medications on file   Modified Medications    No medications on file   New Prescriptions   No medications on file   Labs/ tests ordered today include:   No orders of the defined types were placed in this encounter.      Disposition:    FU with     Signed, Tereso Newcomer, PA-C, MHS 02/12/2015 1:37 PM    [facility] [address] [place], [place], [place]  [address] Phone: [phone]; Fax: [phone]  This encounter was created in error - please disregard.

## 2015-02-19 ENCOUNTER — Encounter: Payer: Self-pay | Admitting: Physician Assistant

## 2015-02-19 NOTE — Progress Notes (Signed)
Cardiology Office Note   Date:  02/19/2015   ID:  Randall Patel, DOB Jun 17, 1950, MRN [id number]  PCP:  [name], MD  Cardiologist:  Dr. Sanjuana Kava   Medication refill     History of Present Illness: Randall Patel is a 64 y.o. male who presents for CAD, CVA in 12/13, HTN, tobacco abuse, Ischemic CM (EF 35-40%), and HLD who presents to clinic today for medication refill.  He had his first MI in 2008. He was admitted with bradycardia, chest pain and taken to cath lab as urgent NSTEMI 04/11/12 and was found to have a totally occluded distal RCA. This was treated with a drug eluting stent in the mid to distal RCA and angioplasty of the Posterolateral segment. His LVEF post MI was 35-40%.  Last seen by Dr. Sanjuana Kava in 04/2014. Still smoking at that time and reported occasional chest pain but this is mostly dull and lasts for 24 hours, occurs at rest. No exertional chest pain. No SOB. Most recent 2D ECHO: 06/07/2012, LV EF: 35% - 40%, G1DD, mild MR.   Today he presents for medication refill. He is still smoking less than 1 PPD. No CP or SOB.  No LE edema, orthopnea or PND. No blood in his stool or urine. No lightheadedness or syncope. Sometimes he feels like he has a fluttering in his chest and he takes a SL NTG and it goes away. He ran out of 4 of his meds a couple weeks ago and needs refills.      Past Medical History  Diagnosis Date  . Hypertension   . Coronary artery disease     MI in 2008 with PCI to ? vessel; NSTEMI 03/2012; NSTEMI 04/2012 Cath showed triple vessel dz including occluded RCA, s/p DES to distal RCA, PTCA posterolateral branch   . CVA (cerebral infarction)     03/2012 with left arm weakness/numbness and left facial droop.  . Ischemic cardiomyopathy     04/2012 EF 35-40%  . Hyperlipidemia   . Claudication (HCC)   . Tobacco abuse     quit 04/06/12  . ETOH abuse     quit 04/06/12    Past Surgical History  Procedure Laterality Date  . Hernia repair     . Left heart catheterization with coronary angiogram N/A 04/11/2012    Procedure: LEFT HEART CATHETERIZATION WITH CORONARY ANGIOGRAM;  Surgeon: Kathleene Hazel, MD;  Location: [facility] CATH LAB;  Service: Cardiovascular;  Laterality: N/A;  . Percutaneous coronary stent intervention (pci-s)  04/11/2012    Procedure: PERCUTANEOUS CORONARY STENT INTERVENTION (PCI-S);  Surgeon: Kathleene Hazel, MD;  Location: [facility] CATH LAB;  Service: Cardiovascular;;  Distal RCA  . Percutaneous coronary intervention-balloon only  04/11/2012    Procedure: PERCUTANEOUS CORONARY INTERVENTION-BALLOON ONLY;  Surgeon: Kathleene Hazel, MD;  Location: [facility] CATH LAB;  Service: Cardiovascular;;  RPLA     Current Outpatient Prescriptions  Medication Sig Dispense Refill  . aspirin 81 MG chewable tablet Chew 1 tablet (81 mg total) by mouth daily.    [name] atorvastatin (LIPITOR) 80 MG tablet TAKE ONE TABLET BY MOUTH ONCE DAILY AT BEDTIME 14 tablet 0  . carvedilol (COREG) 6.25 MG tablet Take 1 tablet (6.25 mg total) by mouth 2 (two) times daily with a meal. 180 tablet 3  . clopidogrel (PLAVIX) 75 MG tablet Take 1 tablet (75 mg total) by mouth daily. 90 tablet 3  . furosemide (LASIX) 20 MG tablet TAKE ONE TABLET BY MOUTH ONCE DAILY 14 tablet 0  .  HYDROcodone-acetaminophen (NORCO/VICODIN) 5-325 MG per tablet Take 1-2 tablets every 6 hours as needed for severe pain 10 tablet 0  . losartan (COZAAR) 25 MG tablet Take 1 tablet (25 mg total) by mouth daily. 90 tablet 3  . meloxicam (MOBIC) 7.5 MG tablet Take 7.5 mg by mouth 2 (two) times daily.    . methocarbamol (ROBAXIN) 500 MG tablet Take 1 tablet (500 mg total) by mouth 4 (four) times daily. 20 tablet 0  . nitroGLYCERIN (NITROSTAT) 0.4 MG SL tablet Place 1 tablet (0.4 mg total) under the tongue every 5 (five) minutes as needed for chest pain (up to 3 doses). 25 tablet 3   No current facility-administered medications for this visit.    Allergies:   Lisinopril    Social  History:  The patient  reports that he has been smoking Cigarettes.  He has a 40 pack-year smoking history. He has never used smokeless tobacco. He reports that he drinks alcohol. He reports that he does not use illicit drugs.   Family History:  The patient's family history includes Cancer in his mother; Heart attack in his father.    ROS:  Please see the history of present illness.   Otherwise, review of systems are positive for NONE..   All other systems are reviewed and negative.    PHYSICAL EXAM: VS:  There were no vitals taken for this visit. , BMI There is no weight on file to calculate BMI. GEN: Well nourished, well developed, in no acute distress HEENT: normal Neck: no JVD, carotid bruits, or masses Cardiac: RRR; no murmurs, rubs, or gallops,no edema  Respiratory:  clear to auscultation bilaterally, normal work of breathing GI: soft, nontender, nondistended, + BS MS: no deformity or atrophy Skin: warm and dry, no rash Neuro:  Strength and sensation are intact Psych: euthymic mood, full affect   EKG:  EKG is ordered today. The ekg ordered today demonstrates NSR with LVH and old inferior infarct  Recent Labs: No results found for requested labs within last 365 days.    Lipid Panel    Component Value Date/Time   CHOL 204* 07/27/2013 1524   TRIG 116.0 07/27/2013 1524   HDL 95.30 07/27/2013 1524   CHOLHDL 2 07/27/2013 1524   VLDL 23.2 07/27/2013 1524   LDLCALC 86 07/27/2013 1524      Wt Readings from Last 3 Encounters:  07/26/14 168 lb (76.204 kg)  04/18/14 168 lb 1.9 oz (76.259 kg)  07/27/13 162 lb (73.483 kg)      Other studies Reviewed: Additional studies/ records that were reviewed today include: 2D ECHO Review of the above records demonstrates:   2D ECHO: 06/07/2012 LV EF: 35% - 40% Study Conclusions - Left ventricle: The cavity size was normal. Wall thickness was normal. Systolic function was moderately reduced. The estimated ejection fraction was  in the range of 35% to 40%. There is hypokinesis of the entireinferior myocardium. Doppler parameters are consistent with abnormal left ventricular relaxation (grade 1 diastolic dysfunction). - Mitral valve: Calcified annulus. Mild regurgitation.  Cardiac catheterization (December 2013).  The study demonstrated coronary artery disease. There was an occlusion in the right coronary artery which was treated with a drug-eluting stent and angioplasty.    ASSESSMENT AND PLAN:  Randall Patel is a 64 y.o. male who presents for CAD, CVA in 12/13, HTN, tobacco abuse, Ischemic CM (EF 35-40%), and HLD who presents to clinic today for medication refill.  CAD: Stable. He is on ASA/Plavix/statin/ARB, beta blocker.   Ischemic  cardiomyopathy: LVEF 35-40%. Will continue medical management.   Tobacco abuse: Smoking cessation recommended. He wishes to stop  HTN: BP is mildly elevated today 140/110 but he ran out of his Losartan. No changes.   Hyperlipidemia: Continue statin.  CVA- continue ASA  Current medicines are reviewed at length with the patient today.  The patient does not have concerns regarding medicines.  The following changes have been made:  no change  Labs/ tests ordered today include:  No orders of the defined types were placed in this encounter.    Disposition:   FU with Dr. Sanjuana Kava in 6 months.   Charlestine Massed  02/19/2015 8:33 PM    [facility] [address] [place], [place], [place]  [address] Phone: [phone]; Fax: [phone]

## 2015-02-20 ENCOUNTER — Ambulatory Visit (INDEPENDENT_AMBULATORY_CARE_PROVIDER_SITE_OTHER): Payer: Medicare Other | Admitting: Physician Assistant

## 2015-02-20 ENCOUNTER — Encounter: Payer: Self-pay | Admitting: Physician Assistant

## 2015-02-20 VITALS — BP 140/110 | HR 71 | Ht 65.0 in | Wt 165.0 lb

## 2015-02-20 DIAGNOSIS — I2510 Atherosclerotic heart disease of native coronary artery without angina pectoris: Secondary | ICD-10-CM

## 2015-02-20 DIAGNOSIS — I10 Essential (primary) hypertension: Secondary | ICD-10-CM

## 2015-02-20 DIAGNOSIS — I255 Ischemic cardiomyopathy: Secondary | ICD-10-CM

## 2015-02-20 MED ORDER — ATORVASTATIN CALCIUM 80 MG PO TABS: 80.0000 mg | ORAL_TABLET | Freq: Every day | ORAL | Status: DC

## 2015-02-20 MED ORDER — FUROSEMIDE 20 MG PO TABS: 20.0000 mg | ORAL_TABLET | Freq: Every day | ORAL | Status: DC

## 2015-02-20 MED ORDER — CLOPIDOGREL BISULFATE 75 MG PO TABS: 75.0000 mg | ORAL_TABLET | Freq: Every day | ORAL | Status: DC

## 2015-02-20 MED ORDER — LOSARTAN POTASSIUM 25 MG PO TABS: 25.0000 mg | ORAL_TABLET | Freq: Every day | ORAL | Status: DC

## 2015-02-20 NOTE — Patient Instructions (Signed)
Medication Instructions:  Your physician recommends that you continue on your current medications as directed. Please refer to the Current Medication list given to you today.  The following medication refills was sent to your local pharmacy today:  Losartan Lasix Carvedilol Atorvastatin  Labwork: None ordered  Testing/Procedures: None ordered  Follow-Up: Your physician wants you to follow-up in: 6 MONTHS WITH DR. Clifton James:  You will receive a reminder letter in the mail two months in advance. If you don't receive a letter, please call our office to schedule the follow-up appointment.   Any Other Special Instructions Will Be Listed Below (If Applicable).

## 2015-02-24 ENCOUNTER — Telehealth: Payer: Self-pay | Admitting: Cardiovascular Disease

## 2015-02-24 NOTE — Telephone Encounter (Signed)
°*  STAT* If patient is at the pharmacy, call can be transferred to refill team.   1. Which medications need to be refilled? (please list name of each medication and dose if known) Plavix  2. Which pharmacy/location (including street and city if local pharmacy) is medication to be sent to? [facility] on [address]  in [place]  3. Do they need a 30 day or 90 day supply? 90 Day

## 2015-02-24 NOTE — Telephone Encounter (Signed)
Called pt but no answer or VM set up. I wanted to inform pt that his Rx was sent over on 02/20/15, 90 day supply with 3 refills, to his pharmacy requested.

## 2015-03-13 DIAGNOSIS — I639 Cerebral infarction, unspecified: Secondary | ICD-10-CM

## 2015-03-13 HISTORY — DX: Cerebral infarction, unspecified: I63.9

## 2015-04-04 ENCOUNTER — Inpatient Hospital Stay (HOSPITAL_COMMUNITY)
Admission: EM | Admit: 2015-04-04 | Discharge: 2015-04-10 | DRG: 064 | Disposition: A | Discharge status: Rehabilitation Facility | Payer: Medicare Other | Attending: Neurology | Admitting: Neurology

## 2015-04-04 ENCOUNTER — Encounter (HOSPITAL_COMMUNITY): Payer: Self-pay | Admitting: Emergency Medicine

## 2015-04-04 ENCOUNTER — Emergency Department (HOSPITAL_COMMUNITY): Payer: Medicare Other

## 2015-04-04 DIAGNOSIS — I610 Nontraumatic intracerebral hemorrhage in hemisphere, subcortical: Secondary | ICD-10-CM | POA: Diagnosis not present

## 2015-04-04 DIAGNOSIS — I25119 Atherosclerotic heart disease of native coronary artery with unspecified angina pectoris: Secondary | ICD-10-CM | POA: Diagnosis not present

## 2015-04-04 DIAGNOSIS — G936 Cerebral edema: Secondary | ICD-10-CM | POA: Diagnosis not present

## 2015-04-04 DIAGNOSIS — I63412 Cerebral infarction due to embolism of left middle cerebral artery: Secondary | ICD-10-CM | POA: Diagnosis not present

## 2015-04-04 DIAGNOSIS — D62 Acute posthemorrhagic anemia: Secondary | ICD-10-CM | POA: Diagnosis not present

## 2015-04-04 DIAGNOSIS — F1010 Alcohol abuse, uncomplicated: Secondary | ICD-10-CM | POA: Diagnosis present

## 2015-04-04 DIAGNOSIS — Z720 Tobacco use: Secondary | ICD-10-CM | POA: Insufficient documentation

## 2015-04-04 DIAGNOSIS — M79601 Pain in right arm: Secondary | ICD-10-CM

## 2015-04-04 DIAGNOSIS — E870 Hyperosmolality and hypernatremia: Secondary | ICD-10-CM | POA: Insufficient documentation

## 2015-04-04 DIAGNOSIS — R402142 Coma scale, eyes open, spontaneous, at arrival to emergency department: Secondary | ICD-10-CM | POA: Diagnosis present

## 2015-04-04 DIAGNOSIS — F17210 Nicotine dependence, cigarettes, uncomplicated: Secondary | ICD-10-CM | POA: Diagnosis present

## 2015-04-04 DIAGNOSIS — Z888 Allergy status to other drugs, medicaments and biological substances status: Secondary | ICD-10-CM | POA: Diagnosis not present

## 2015-04-04 DIAGNOSIS — I255 Ischemic cardiomyopathy: Secondary | ICD-10-CM | POA: Diagnosis present

## 2015-04-04 DIAGNOSIS — I611 Nontraumatic intracerebral hemorrhage in hemisphere, cortical: Secondary | ICD-10-CM | POA: Diagnosis not present

## 2015-04-04 DIAGNOSIS — I612 Nontraumatic intracerebral hemorrhage in hemisphere, unspecified: Secondary | ICD-10-CM | POA: Diagnosis not present

## 2015-04-04 DIAGNOSIS — Z7982 Long term (current) use of aspirin: Secondary | ICD-10-CM

## 2015-04-04 DIAGNOSIS — I10 Essential (primary) hypertension: Secondary | ICD-10-CM | POA: Diagnosis present

## 2015-04-04 DIAGNOSIS — R1310 Dysphagia, unspecified: Secondary | ICD-10-CM | POA: Insufficient documentation

## 2015-04-04 DIAGNOSIS — I619 Nontraumatic intracerebral hemorrhage, unspecified: Secondary | ICD-10-CM | POA: Diagnosis not present

## 2015-04-04 DIAGNOSIS — Z79899 Other long term (current) drug therapy: Secondary | ICD-10-CM

## 2015-04-04 DIAGNOSIS — I629 Nontraumatic intracranial hemorrhage, unspecified: Secondary | ICD-10-CM | POA: Diagnosis not present

## 2015-04-04 DIAGNOSIS — I639 Cerebral infarction, unspecified: Secondary | ICD-10-CM

## 2015-04-04 DIAGNOSIS — R0682 Tachypnea, not elsewhere classified: Secondary | ICD-10-CM | POA: Insufficient documentation

## 2015-04-04 DIAGNOSIS — I6930 Unspecified sequelae of cerebral infarction: Secondary | ICD-10-CM | POA: Insufficient documentation

## 2015-04-04 DIAGNOSIS — I2510 Atherosclerotic heart disease of native coronary artery without angina pectoris: Secondary | ICD-10-CM | POA: Diagnosis present

## 2015-04-04 DIAGNOSIS — I69922 Dysarthria following unspecified cerebrovascular disease: Secondary | ICD-10-CM | POA: Diagnosis not present

## 2015-04-04 DIAGNOSIS — R402362 Coma scale, best motor response, obeys commands, at arrival to emergency department: Secondary | ICD-10-CM | POA: Diagnosis present

## 2015-04-04 DIAGNOSIS — R402252 Coma scale, best verbal response, oriented, at arrival to emergency department: Secondary | ICD-10-CM | POA: Diagnosis present

## 2015-04-04 DIAGNOSIS — I160 Hypertensive urgency: Secondary | ICD-10-CM | POA: Diagnosis not present

## 2015-04-04 DIAGNOSIS — I252 Old myocardial infarction: Secondary | ICD-10-CM | POA: Diagnosis not present

## 2015-04-04 DIAGNOSIS — R471 Dysarthria and anarthria: Secondary | ICD-10-CM | POA: Diagnosis present

## 2015-04-04 DIAGNOSIS — E785 Hyperlipidemia, unspecified: Secondary | ICD-10-CM | POA: Diagnosis not present

## 2015-04-04 DIAGNOSIS — G839 Paralytic syndrome, unspecified: Secondary | ICD-10-CM | POA: Diagnosis not present

## 2015-04-04 DIAGNOSIS — E876 Hypokalemia: Secondary | ICD-10-CM | POA: Diagnosis present

## 2015-04-04 DIAGNOSIS — I6789 Other cerebrovascular disease: Secondary | ICD-10-CM | POA: Diagnosis not present

## 2015-04-04 DIAGNOSIS — I5189 Other ill-defined heart diseases: Secondary | ICD-10-CM | POA: Insufficient documentation

## 2015-04-04 DIAGNOSIS — Z955 Presence of coronary angioplasty implant and graft: Secondary | ICD-10-CM

## 2015-04-04 DIAGNOSIS — R29810 Facial weakness: Secondary | ICD-10-CM | POA: Diagnosis present

## 2015-04-04 DIAGNOSIS — G729 Myopathy, unspecified: Secondary | ICD-10-CM | POA: Diagnosis not present

## 2015-04-04 DIAGNOSIS — G8192 Hemiplegia, unspecified affecting left dominant side: Secondary | ICD-10-CM | POA: Diagnosis present

## 2015-04-04 DIAGNOSIS — IMO0002 Reserved for concepts with insufficient information to code with codable children: Secondary | ICD-10-CM | POA: Insufficient documentation

## 2015-04-04 DIAGNOSIS — D696 Thrombocytopenia, unspecified: Secondary | ICD-10-CM | POA: Diagnosis present

## 2015-04-04 DIAGNOSIS — Z7902 Long term (current) use of antithrombotics/antiplatelets: Secondary | ICD-10-CM | POA: Diagnosis not present

## 2015-04-04 DIAGNOSIS — R05 Cough: Secondary | ICD-10-CM | POA: Diagnosis not present

## 2015-04-04 DIAGNOSIS — I69354 Hemiplegia and hemiparesis following cerebral infarction affecting left non-dominant side: Secondary | ICD-10-CM | POA: Diagnosis not present

## 2015-04-04 DIAGNOSIS — Z452 Encounter for adjustment and management of vascular access device: Secondary | ICD-10-CM | POA: Diagnosis not present

## 2015-04-04 DIAGNOSIS — I82621 Acute embolism and thrombosis of deep veins of right upper extremity: Secondary | ICD-10-CM | POA: Diagnosis present

## 2015-04-04 DIAGNOSIS — K029 Dental caries, unspecified: Secondary | ICD-10-CM | POA: Diagnosis present

## 2015-04-04 DIAGNOSIS — I519 Heart disease, unspecified: Secondary | ICD-10-CM | POA: Diagnosis not present

## 2015-04-04 DIAGNOSIS — I69991 Dysphagia following unspecified cerebrovascular disease: Secondary | ICD-10-CM | POA: Diagnosis not present

## 2015-04-04 LAB — OSMOLALITY: Osmolality: 297 mOsm/kg — ABNORMAL HIGH (ref 275–295)

## 2015-04-04 LAB — CBC
HEMATOCRIT: 45 % (ref 39.0–52.0)
Hemoglobin: 14.4 g/dL (ref 13.0–17.0)
MCH: 27.7 pg (ref 26.0–34.0)
MCHC: 32 g/dL (ref 30.0–36.0)
MCV: 86.5 fL (ref 78.0–100.0)
PLATELETS: 164 10*3/uL (ref 150–400)
RBC: 5.2 MIL/uL (ref 4.22–5.81)
RDW: 15 % (ref 11.5–15.5)
WBC: 6.4 10*3/uL (ref 4.0–10.5)

## 2015-04-04 LAB — URINALYSIS, ROUTINE W REFLEX MICROSCOPIC
Bilirubin Urine: NEGATIVE
GLUCOSE, UA: NEGATIVE mg/dL
HGB URINE DIPSTICK: NEGATIVE
Ketones, ur: 15 mg/dL — AB
Leukocytes, UA: NEGATIVE
Nitrite: NEGATIVE
Protein, ur: NEGATIVE mg/dL
SPECIFIC GRAVITY, URINE: 1.01 (ref 1.005–1.030)
pH: 7.5 (ref 5.0–8.0)

## 2015-04-04 LAB — COMPREHENSIVE METABOLIC PANEL
ALT: 122 U/L — ABNORMAL HIGH (ref 17–63)
AST: 83 U/L — AB (ref 15–41)
Albumin: 4.3 g/dL (ref 3.5–5.0)
Alkaline Phosphatase: 56 U/L (ref 38–126)
Anion gap: 15 (ref 5–15)
BUN: 6 mg/dL (ref 6–20)
CHLORIDE: 104 mmol/L (ref 101–111)
CO2: 20 mmol/L — AB (ref 22–32)
Calcium: 9.8 mg/dL (ref 8.9–10.3)
Creatinine, Ser: 0.82 mg/dL (ref 0.61–1.24)
Glucose, Bld: 115 mg/dL — ABNORMAL HIGH (ref 65–99)
POTASSIUM: 4.1 mmol/L (ref 3.5–5.1)
SODIUM: 139 mmol/L (ref 135–145)
Total Bilirubin: 0.7 mg/dL (ref 0.3–1.2)
Total Protein: 7.8 g/dL (ref 6.5–8.1)

## 2015-04-04 LAB — PROTIME-INR
INR: 1.08 (ref 0.00–1.49)
PROTHROMBIN TIME: 14.2 s (ref 11.6–15.2)

## 2015-04-04 LAB — DIFFERENTIAL
BASOS ABS: 0 10*3/uL (ref 0.0–0.1)
BASOS PCT: 0 %
EOS ABS: 0 10*3/uL (ref 0.0–0.7)
Eosinophils Relative: 1 %
Lymphocytes Relative: 12 %
Lymphs Abs: 0.7 10*3/uL (ref 0.7–4.0)
MONOS PCT: 5 %
Monocytes Absolute: 0.3 10*3/uL (ref 0.1–1.0)
NEUTROS PCT: 82 %
Neutro Abs: 5.3 10*3/uL (ref 1.7–7.7)

## 2015-04-04 LAB — RAPID URINE DRUG SCREEN, HOSP PERFORMED
Amphetamines: NOT DETECTED
BENZODIAZEPINES: NOT DETECTED
Barbiturates: NOT DETECTED
COCAINE: NOT DETECTED
Opiates: NOT DETECTED
Tetrahydrocannabinol: POSITIVE — AB

## 2015-04-04 LAB — I-STAT CHEM 8, ED
BUN: 6 mg/dL (ref 6–20)
CHLORIDE: 104 mmol/L (ref 101–111)
Calcium, Ion: 1.1 mmol/L — ABNORMAL LOW (ref 1.13–1.30)
Creatinine, Ser: 0.7 mg/dL (ref 0.61–1.24)
Glucose, Bld: 113 mg/dL — ABNORMAL HIGH (ref 65–99)
HEMATOCRIT: 52 % (ref 39.0–52.0)
HEMOGLOBIN: 17.7 g/dL — AB (ref 13.0–17.0)
POTASSIUM: 3.8 mmol/L (ref 3.5–5.1)
SODIUM: 141 mmol/L (ref 135–145)
TCO2: 23 mmol/L (ref 0–100)

## 2015-04-04 LAB — ETHANOL

## 2015-04-04 LAB — I-STAT TROPONIN, ED: TROPONIN I, POC: 0.06 ng/mL (ref 0.00–0.08)

## 2015-04-04 LAB — APTT: APTT: 26 s (ref 24–37)

## 2015-04-04 LAB — MRSA PCR SCREENING: MRSA by PCR: NEGATIVE

## 2015-04-04 MED ORDER — SENNOSIDES-DOCUSATE SODIUM 8.6-50 MG PO TABS
1.0000 | ORAL_TABLET | Freq: Two times a day (BID) | ORAL | Status: DC
  Administered 2015-04-05 – 2015-04-09 (×5): 1 via ORAL
  Filled 2015-04-04 (×6): qty 1

## 2015-04-04 MED ORDER — SODIUM CHLORIDE 0.9 % IV SOLN
INTRAVENOUS | Status: DC
  Administered 2015-04-04 – 2015-04-09 (×3): via INTRAVENOUS

## 2015-04-04 MED ORDER — NICARDIPINE HCL IN NACL 20-0.86 MG/200ML-% IV SOLN
3.0000 mg/h | Freq: Once | INTRAVENOUS | Status: AC
Start: 2015-04-04 — End: 2015-04-04
  Filled 2015-04-04: qty 200

## 2015-04-04 MED ORDER — LABETALOL HCL 5 MG/ML IV SOLN
10.0000 mg | INTRAVENOUS | Status: DC | PRN
  Administered 2015-04-04 – 2015-04-05 (×4): 10 mg via INTRAVENOUS
  Administered 2015-04-07 – 2015-04-09 (×3): 20 mg via INTRAVENOUS
  Administered 2015-04-09: 40 mg via INTRAVENOUS
  Filled 2015-04-04: qty 8
  Filled 2015-04-04 (×2): qty 4
  Filled 2015-04-04: qty 8
  Filled 2015-04-04: qty 4

## 2015-04-04 MED ORDER — SODIUM CHLORIDE 0.9 % IJ SOLN
3.0000 mL | Freq: Two times a day (BID) | INTRAMUSCULAR | Status: DC
  Administered 2015-04-04 – 2015-04-07 (×6): 3 mL via INTRAVENOUS
  Administered 2015-04-07: 10 mL via INTRAVENOUS
  Administered 2015-04-08 – 2015-04-10 (×5): 3 mL via INTRAVENOUS

## 2015-04-04 MED ORDER — NICARDIPINE HCL IN NACL 20-0.86 MG/200ML-% IV SOLN
3.0000 mg/h | Freq: Once | INTRAVENOUS | Status: AC
  Administered 2015-04-04: 5 mg/h via INTRAVENOUS

## 2015-04-04 MED ORDER — STROKE: EARLY STAGES OF RECOVERY BOOK
Freq: Once | Status: AC
  Administered 2015-04-04: 22:00:00
  Filled 2015-04-04: qty 1

## 2015-04-04 MED ORDER — MANNITOL 25 % IV SOLN
70.0000 g | Freq: Once | INTRAVENOUS | Status: AC
  Administered 2015-04-04: 70 g via INTRAVENOUS
  Filled 2015-04-04: qty 280

## 2015-04-04 MED ORDER — MANNITOL 25 % IV SOLN
35.0000 g | Freq: Four times a day (QID) | INTRAVENOUS | Status: DC
  Administered 2015-04-05 – 2015-04-07 (×10): 35 g via INTRAVENOUS
  Filled 2015-04-04 (×3): qty 150
  Filled 2015-04-04 (×2): qty 140
  Filled 2015-04-04 (×2): qty 150
  Filled 2015-04-04: qty 200
  Filled 2015-04-04: qty 150
  Filled 2015-04-04: qty 50
  Filled 2015-04-04 (×3): qty 150
  Filled 2015-04-04: qty 140
  Filled 2015-04-04: qty 50
  Filled 2015-04-04: qty 150

## 2015-04-04 MED ORDER — NICARDIPINE HCL IN NACL 20-0.86 MG/200ML-% IV SOLN
3.0000 mg/h | INTRAVENOUS | Status: DC
  Administered 2015-04-04 – 2015-04-05 (×2): 10 mg/h via INTRAVENOUS
  Administered 2015-04-05: 7.5 mg/h via INTRAVENOUS
  Administered 2015-04-05: 3 mg/h via INTRAVENOUS
  Administered 2015-04-05: 5 mg/h via INTRAVENOUS
  Administered 2015-04-05 (×3): 7.5 mg/h via INTRAVENOUS
  Administered 2015-04-06: 12 mg/h via INTRAVENOUS
  Administered 2015-04-06: 10 mg/h via INTRAVENOUS
  Administered 2015-04-06 (×4): 5 mg/h via INTRAVENOUS
  Administered 2015-04-07: 8 mg/h via INTRAVENOUS
  Administered 2015-04-07 (×2): 5 mg/h via INTRAVENOUS
  Administered 2015-04-07: 8 mg/h via INTRAVENOUS
  Administered 2015-04-07: 12 mg/h via INTRAVENOUS
  Administered 2015-04-07: 10 mg/h via INTRAVENOUS
  Administered 2015-04-08: 5 mg/h via INTRAVENOUS
  Administered 2015-04-08: 7 mg/h via INTRAVENOUS
  Administered 2015-04-08: 7.5 mg/h via INTRAVENOUS
  Administered 2015-04-08: 10 mg/h via INTRAVENOUS
  Administered 2015-04-08: 7.5 mg/h via INTRAVENOUS
  Administered 2015-04-08 (×2): 10 mg/h via INTRAVENOUS
  Filled 2015-04-04: qty 200
  Filled 2015-04-04: qty 400
  Filled 2015-04-04 (×2): qty 200
  Filled 2015-04-04 (×2): qty 400
  Filled 2015-04-04: qty 200
  Filled 2015-04-04: qty 400
  Filled 2015-04-04 (×2): qty 200
  Filled 2015-04-04: qty 400
  Filled 2015-04-04: qty 200
  Filled 2015-04-04: qty 400
  Filled 2015-04-04 (×3): qty 200
  Filled 2015-04-04: qty 400
  Filled 2015-04-04: qty 200

## 2015-04-04 MED ORDER — ACETAMINOPHEN 650 MG RE SUPP: 650.0000 mg | RECTAL | Status: DC | PRN

## 2015-04-04 MED ORDER — PANTOPRAZOLE SODIUM 40 MG IV SOLR
40.0000 mg | Freq: Every day | INTRAVENOUS | Status: DC
  Administered 2015-04-04 – 2015-04-06 (×3): 40 mg via INTRAVENOUS
  Filled 2015-04-04 (×3): qty 40

## 2015-04-04 MED ORDER — ACETAMINOPHEN 325 MG PO TABS
650.0000 mg | ORAL_TABLET | ORAL | Status: DC | PRN
  Administered 2015-04-07 – 2015-04-09 (×2): 650 mg via ORAL
  Filled 2015-04-04 (×2): qty 2

## 2015-04-04 NOTE — Consult Note (Signed)
Reason for Consult:ICH Referring Physician: EDP  Randall Patel is an 64 y.o. male.   HPI:  64 yo male who presented to emergency department with left facial droop and left arm weakness. He had some headache this morning but denies headache now. CT scan showed a large right frontoparietal intracerebral hemorrhage. He is on aspirin and Plavix for coronary artery stenting. He was admitted to neurology.  Past Medical History  Diagnosis Date  . Hypertension   . Coronary artery disease     MI in 2008 with PCI to ? vessel; NSTEMI 03/2012; NSTEMI 04/2012 Cath showed triple vessel dz including occluded RCA, s/p DES to distal RCA, PTCA posterolateral branch   . CVA (cerebral infarction)     03/2012 with left arm weakness/numbness and left facial droop.  . Ischemic cardiomyopathy     04/2012 EF 35-40%  . Hyperlipidemia   . Claudication (HCC)   . Tobacco abuse     quit 04/06/12  . ETOH abuse     quit 04/06/12    Past Surgical History  Procedure Laterality Date  . Hernia repair    . Left heart catheterization with coronary angiogram N/A 04/11/2012    Procedure: LEFT HEART CATHETERIZATION WITH CORONARY ANGIOGRAM;  Surgeon: Kathleene Hazel, MD;  Location: [facility] CATH LAB;  Service: Cardiovascular;  Laterality: N/A;  . Percutaneous coronary stent intervention (pci-s)  04/11/2012    Procedure: PERCUTANEOUS CORONARY STENT INTERVENTION (PCI-S);  Surgeon: Kathleene Hazel, MD;  Location: [facility] CATH LAB;  Service: Cardiovascular;;  Distal RCA  . Percutaneous coronary intervention-balloon only  04/11/2012    Procedure: PERCUTANEOUS CORONARY INTERVENTION-BALLOON ONLY;  Surgeon: Kathleene Hazel, MD;  Location: [facility] CATH LAB;  Service: Cardiovascular;;  RPLA    Allergies  Allergen Reactions  . Lisinopril Swelling    Social History  Substance Use Topics  . Smoking status: Current Every Day Smoker -- 1.00 packs/day for 40 years    Types: Cigarettes  . Smokeless tobacco: Never Used  .  Alcohol Use: Yes     Comment: a 40oz or 2 per day    Family History  Problem Relation Age of Onset  . Cancer Mother   . Heart attack Father   . Hypertension Mother   . Hypertension Father   . Hypertension Sister   . Hypertension Brother   . Stroke Sister      Review of Systems  Positive ROS: Unable to obtain  All other systems have been reviewed and were otherwise negative with the exception of those mentioned in the HPI and as above.  Objective: Vital signs in last 24 hours: Temp:  [97.7 F (36.5 C)-98.7 F (37.1 C)] 97.7 F (36.5 C) (12/23 1930) Pulse Rate:  [76-129] 129 (12/23 1935) Resp:  [21-31] 30 (12/23 1935) BP: (165-198)/(92-128) 189/128 mmHg (12/23 1935) SpO2:  [92 %-99 %] 97 % (12/23 1935) Weight:  [71.1 kg (156 lb 12 oz)] 71.1 kg (156 lb 12 oz) (12/23 1930)  General Appearance: Alert, cooperative, no distress, appears stated age Head: Normocephalic, without obvious abnormality, atraumatic Eyes: PERRL, conjunctiva/corneas clear, right gaze preference      Neck: Supple, symmetrical, trachea midline Lungs:  respirations unlabored Heart: Regular rate and rhythm   NEUROLOGIC:   Mental status: A&O x4, no aphasia, good attention span, Memory and fund of knowledge appear to be appropriate Motor Exam - grossly normal, normal tone and bulk on the right but he is left hemiplegic with a left facial droop Gait - not tested Balance -  not tested Cranial Nerves: I: smell Not tested  II: visual acuity  OS: na    OD: na  II: visual fields   II: pupils Equal, round, reactive to light  III,VII: ptosis None  III,IV,VI: extraocular muscles    V: mastication Normal  V: facial light touch sensation  Normal  V,VII: corneal reflex  Present  VII: facial muscle function - upper   l  VII: facial muscle function - lower  left facial droop   VIII: hearing Not tested  IX: soft palate elevation  Normal  IX,X: gag reflex Present  XI: trapezius strength  5/5  XI:  sternocleidomastoid strength 5/5  XI: neck flexion strength  5/5  XII: tongue strength  Normal    Data Review Lab Results  Component Value Date   WBC 6.4 04/04/2015   HGB 17.7* 04/04/2015   HCT 52.0 04/04/2015   MCV 86.5 04/04/2015   PLT 164 04/04/2015   Lab Results  Component Value Date   NA 141 04/04/2015   K 3.8 04/04/2015   CL 104 04/04/2015   CO2 20* 04/04/2015   BUN 6 04/04/2015   CREATININE 0.70 04/04/2015   GLUCOSE 113* 04/04/2015   Lab Results  Component Value Date   INR 1.08 04/04/2015    Radiology: Ct Head Wo Contrast  04/04/2015  CLINICAL DATA:  Right-sided gaze an left-sided paralysis. EXAM: CT HEAD WITHOUT CONTRAST TECHNIQUE: Contiguous axial images were obtained from the base of the skull through the vertex without intravenous contrast. COMPARISON:  MRI of the head 04/03/2012 FINDINGS: There is a large right frontoparietal intraparenchymal hemorrhage with areas of active extravasation. There is surrounding brain parenchymal edema. The abnormality measures approximately 6.4 by 5.4 by 7.4 cm for a volume of 128 cubic cm. There is a 8 mm leftward midline shift. There is mild compression of the right lateral ventricle. No evidence of downward uncal or tonsillar herniation. There is an area of encephalomalacia in the anterior right frontal lobe from prior infarct. Background brain parenchymal volume loss. No evidence of fracture. Paranasal sinuses and mastoid air cells are normally aerated. IMPRESSION: Large acute right frontoparietal intracranial hemorrhage, with evidence of active extravasation and 8 mm leftward midline shift. No evidence of downward herniation. These results were called by telephone at the time of interpretation on 04/04/2015 at 6:35 pm to Dr. Cherylynn Ridges, who verbally acknowledged these results. Electronically Signed   By: Ted Mcalpine M.D.   On: 04/04/2015 18:50     Assessment/Plan: Right frontoparietal intracerebral hemorrhage as above. He is  awake but left hemiplegic. I do not believe surgical intervention is warranted at this time unless he has a significant change in mental status. Obviously hold his Plavix and aspirin. Keep head of bed elevated.   Samia Kukla S 04/04/2015 8:11 PM

## 2015-04-04 NOTE — ED Provider Notes (Signed)
CSN: [id number]     Arrival date & time 04/04/15  1814 History   First MD Initiated Contact with Patient 04/04/15 1823     Chief Complaint  Patient presents with  . Code Stroke   64 yo M w/hx of CAD and NSTEMI s/p DES in '13 (on plavix and ASA), HTN, ischemic cardiomyopathy, HLD, and prior CVA presents by EMS for left sided weakness. Last known normal at 9AM.  Endorses left face, arm, and leg weakness. He denies CP, SOB, fever, chills, N/V, diarrhea, constipation, hematemesis, dysuria, hematuria, sick contacts, or recent travel, or head trauma.   (Consider location/radiation/quality/duration/timing/severity/associated sxs/prior Treatment) Patient is a 64 y.o. male presenting with Acute Neurological Problem.  Cerebrovascular Accident This is a new problem. The current episode started today. The problem occurs constantly. The problem has been gradually worsening. Associated symptoms include weakness (left sided). Pertinent negatives include no abdominal pain, chest pain, chills, congestion, coughing, fever, headaches, nausea or vomiting. Nothing aggravates the symptoms. He has tried nothing for the symptoms.    Past Medical History  Diagnosis Date  . Hypertension   . Coronary artery disease     MI in 2008 with PCI to ? vessel; NSTEMI 03/2012; NSTEMI 04/2012 Cath showed triple vessel dz including occluded RCA, s/p DES to distal RCA, PTCA posterolateral branch   . CVA (cerebral infarction)     03/2012 with left arm weakness/numbness and left facial droop.  . Ischemic cardiomyopathy     04/2012 EF 35-40%  . Hyperlipidemia   . Claudication (HCC)   . Tobacco abuse     quit 04/06/12  . ETOH abuse     quit 04/06/12   Past Surgical History  Procedure Laterality Date  . Hernia repair    . Left heart catheterization with coronary angiogram N/A 04/11/2012    Procedure: LEFT HEART CATHETERIZATION WITH CORONARY ANGIOGRAM;  Surgeon: Kathleene Hazel, MD;  Location: [facility] CATH LAB;  Service:  Cardiovascular;  Laterality: N/A;  . Percutaneous coronary stent intervention (pci-s)  04/11/2012    Procedure: PERCUTANEOUS CORONARY STENT INTERVENTION (PCI-S);  Surgeon: Kathleene Hazel, MD;  Location: [facility] CATH LAB;  Service: Cardiovascular;;  Distal RCA  . Percutaneous coronary intervention-balloon only  04/11/2012    Procedure: PERCUTANEOUS CORONARY INTERVENTION-BALLOON ONLY;  Surgeon: Kathleene Hazel, MD;  Location: [facility] CATH LAB;  Service: Cardiovascular;;  RPLA   Family History  Problem Relation Age of Onset  . Cancer Mother   . Heart attack Father   . Hypertension Mother   . Hypertension Father   . Hypertension Sister   . Hypertension Brother   . Stroke Sister    Social History  Substance Use Topics  . Smoking status: Current Every Day Smoker -- 1.00 packs/day for 40 years    Types: Cigarettes  . Smokeless tobacco: Never Used  . Alcohol Use: Yes     Comment: a 40oz or 2 per day    Review of Systems  Constitutional: Negative for fever and chills.  HENT: Negative for congestion.   Respiratory: Negative for cough and shortness of breath.   Cardiovascular: Negative for chest pain, palpitations and leg swelling.  Gastrointestinal: Negative for nausea, vomiting, abdominal pain, diarrhea, constipation and abdominal distention.  Genitourinary: Negative for dysuria, frequency, flank pain and decreased urine volume.  Neurological: Positive for facial asymmetry and weakness (left sided). Negative for dizziness, speech difficulty, light-headedness and headaches.  All other systems reviewed and are negative.     Allergies  Lisinopril  Home Medications  Prior to Admission medications   Medication Sig Start Date End Date Taking? Authorizing Provider  aspirin 81 MG chewable tablet Chew 1 tablet (81 mg total) by mouth daily. 04/14/12  Yes Jessica A Hope, PA-C  atorvastatin (LIPITOR) 80 MG tablet Take 1 tablet (80 mg total) by mouth daily with breakfast. 02/20/15  Yes  Janetta Hora, PA-C  carvedilol (COREG) 6.25 MG tablet Take 1 tablet (6.25 mg total) by mouth 2 (two) times daily with a meal. 04/18/14  Yes Kathleene Hazel, MD  clopidogrel (PLAVIX) 75 MG tablet Take 1 tablet (75 mg total) by mouth daily. 02/20/15  Yes Janetta Hora, PA-C  furosemide (LASIX) 20 MG tablet Take 1 tablet (20 mg total) by mouth daily. 02/20/15   Janetta Hora, PA-C  HYDROcodone-acetaminophen (NORCO/VICODIN) 5-325 MG per tablet Take 1-2 tablets every 6 hours as needed for severe pain 07/26/14   Renne Crigler, PA-C  losartan (COZAAR) 25 MG tablet Take 1 tablet (25 mg total) by mouth daily. 02/20/15   Janetta Hora, PA-C  meloxicam (MOBIC) 7.5 MG tablet Take 7.5 mg by mouth 2 (two) times daily.    Historical Provider, MD  nitroGLYCERIN (NITROSTAT) 0.4 MG SL tablet Place 1 tablet (0.4 mg total) under the tongue every 5 (five) minutes as needed for chest pain (up to 3 doses). 04/14/12   Jessica A Hope, PA-C   BP 189/128 mmHg  Pulse 129  Temp(Src) 97.7 F (36.5 C) (Oral)  Resp 30  Ht  (1.651 m)  Wt 71.1 kg  BMI 26.08 kg/m2  SpO2 97% Physical Exam  Constitutional: He is oriented to person, place, and time. He appears well-developed and well-nourished. No distress.  HENT:  Head: Normocephalic and atraumatic.  Eyes: Pupils are equal, round, and reactive to light.  Neck: Normal range of motion.  Cardiovascular: Normal rate, regular rhythm, normal heart sounds and intact distal pulses.  Exam reveals no gallop and no friction rub.   No murmur heard. Pulmonary/Chest: Effort normal and breath sounds normal. No respiratory distress. He has no wheezes. He has no rales. He exhibits no tenderness.  Abdominal: Soft. Bowel sounds are normal. He exhibits no distension and no mass. There is no tenderness. There is no rebound and no guarding.  Musculoskeletal: Normal range of motion.  Lymphadenopathy:    He has no cervical adenopathy.  Neurological: He is alert and  oriented to person, place, and time. A cranial nerve deficit is present. No sensory deficit. He exhibits abnormal muscle tone (left arm, leg, face). GCS eye subscore is 4. GCS verbal subscore is 5. GCS motor subscore is 6.  Left facial weakness. Left arm weakness 1/5 strength. Left leg weakness 3/5 strength. Sensation intact. Following commands.   Skin: Skin is warm and dry. He is not diaphoretic.  Nursing note and vitals reviewed.   ED Course  Procedures (including critical care time) Labs Review Labs Reviewed  COMPREHENSIVE METABOLIC PANEL - Abnormal; Notable for the following:    CO2 20 (*)    Glucose, Bld 115 (*)    AST 83 (*)    ALT 122 (*)    All other components within normal limits  I-STAT CHEM 8, ED - Abnormal; Notable for the following:    Glucose, Bld 113 (*)    Calcium, Ion 1.10 (*)    Hemoglobin 17.7 (*)    All other components within normal limits  MRSA PCR SCREENING  ETHANOL  PROTIME-INR  APTT  CBC  DIFFERENTIAL  URINE  RAPID DRUG SCREEN, HOSP PERFORMED  URINALYSIS, ROUTINE W REFLEX MICROSCOPIC (NOT AT [facility])  SODIUM  SODIUM  OSMOLALITY  OSMOLALITY  OSMOLALITY  I-STAT TROPOININ, ED    Imaging Review Ct Head Wo Contrast  04/04/2015  CLINICAL DATA:  Right-sided gaze an left-sided paralysis. EXAM: CT HEAD WITHOUT CONTRAST TECHNIQUE: Contiguous axial images were obtained from the base of the skull through the vertex without intravenous contrast. COMPARISON:  MRI of the head 04/03/2012 FINDINGS: There is a large right frontoparietal intraparenchymal hemorrhage with areas of active extravasation. There is surrounding brain parenchymal edema. The abnormality measures approximately 6.4 by 5.4 by 7.4 cm for a volume of 128 cubic cm. There is a 8 mm leftward midline shift. There is mild compression of the right lateral ventricle. No evidence of downward uncal or tonsillar herniation. There is an area of encephalomalacia in the anterior right frontal lobe from prior infarct.  Background brain parenchymal volume loss. No evidence of fracture. Paranasal sinuses and mastoid air cells are normally aerated. IMPRESSION: Large acute right frontoparietal intracranial hemorrhage, with evidence of active extravasation and 8 mm leftward midline shift. No evidence of downward herniation. These results were called by telephone at the time of interpretation on 04/04/2015 at 6:35 pm to Dr. Cherylynn Ridges, who verbally acknowledged these results. Electronically Signed   By: Ted Mcalpine M.D.   On: 04/04/2015 18:50   I have personally reviewed and evaluated these images and lab results as part of my medical decision-making.   EKG Interpretation None      MDM   Final diagnoses:  Intracranial hemorrhage (HCC)   64 year old male presents with code stroke for acute onset of left-sided weakness, last known normal at 90 and this morning. He is on blood thinners due to cardiac history. Considered for head bleed. Airway intact and sent to CT scanner. Neurology at bedside.  Patient has a deficits on the left arm and leg: Weakness. Sensation intact. Left-sided facial droop. Otherwise, patient following commands appropriately.  CT head shows acute large right sided intracranial hemorrhage. With midline shift. Given mannitol and aggressive blood pressure control initiated with IV nicardipine drip.  Patient admitted to ICU under neurology. Briefly spoke with neurosurgery so that they are aware in case patient were to deteriorate with worsening bleed.  Pt was seen under the supervision of Dr. Madilyn Hook.     Rachelle Hora, MD 04/04/15 2015  Tilden Fossa, MD 04/06/15 [phone]

## 2015-04-04 NOTE — ED Notes (Signed)
Per EMS, pt was last known well at 900 this morning. Pt reports that he has had a headache throughout the day with associated dizziness. Pt reports his leg started to twitch at 1600 and he then fell. Pt was found by his girl friend at 1800 who called EMS. Upon EMS pt had complete left side paralysis and left sided facial droop. Pt alert x4.

## 2015-04-04 NOTE — H&P (Addendum)
NEURO HOSPITALIST HISTORY AND PHYSICAL       Reason for Consult:Code stroke  ICH score: 1  HPI:                                                                                                                                          Randall Patel is an 64 y.o. male with a hx of stroke and CAD s/p stenting on asa and plavix who presents with acute onset of L sided weakness and found to have Large R ICH on [facility]  Past Medical History  Diagnosis Date  . Hypertension   . Coronary artery disease     MI in 2008 with PCI to ? vessel; NSTEMI 03/2012; NSTEMI 04/2012 Cath showed triple vessel dz including occluded RCA, s/p DES to distal RCA, PTCA posterolateral branch   . CVA (cerebral infarction)     03/2012 with left arm weakness/numbness and left facial droop.  . Ischemic cardiomyopathy     04/2012 EF 35-40%  . Hyperlipidemia   . Claudication (HCC)   . Tobacco abuse     quit 04/06/12  . ETOH abuse     quit 04/06/12    Past Surgical History  Procedure Laterality Date  . Hernia repair    . Left heart catheterization with coronary angiogram N/A 04/11/2012    Procedure: LEFT HEART CATHETERIZATION WITH CORONARY ANGIOGRAM;  Surgeon: Kathleene Hazel, MD;  Location: [facility] CATH LAB;  Service: Cardiovascular;  Laterality: N/A;  . Percutaneous coronary stent intervention (pci-s)  04/11/2012    Procedure: PERCUTANEOUS CORONARY STENT INTERVENTION (PCI-S);  Surgeon: Kathleene Hazel, MD;  Location: [facility] CATH LAB;  Service: Cardiovascular;;  Distal RCA  . Percutaneous coronary intervention-balloon only  04/11/2012    Procedure: PERCUTANEOUS CORONARY INTERVENTION-BALLOON ONLY;  Surgeon: Kathleene Hazel, MD;  Location: [facility] CATH LAB;  Service: Cardiovascular;;  RPLA    Family History  Problem Relation Age of Onset  . Cancer Mother   . Heart attack Father   . Hypertension Mother   . Hypertension Father   . Hypertension Sister   . Hypertension Brother   . Stroke  Sister      Social History:  reports that he has been smoking Cigarettes.  He has a 40 pack-year smoking history. He has never used smokeless tobacco. He reports that he drinks alcohol. He reports that he does not use illicit drugs.  Allergies  Allergen Reactions  . Lisinopril Swelling    MEDICATIONS:  I have reviewed the patient's current medications.   ROS:                                                                                                                                       History obtained from the patient  General ROS: negative for - chills, fatigue, fever, night sweats, weight gain or weight loss Psychological ROS: negative for - behavioral disorder, hallucinations, memory difficulties, mood swings or suicidal ideation Ophthalmic ROS: negative for - blurry vision, double vision, eye pain or loss of vision ENT ROS: negative for - epistaxis, nasal discharge, oral lesions, sore throat, tinnitus or vertigo Allergy and Immunology ROS: negative for - hives or itchy/watery eyes Hematological and Lymphatic ROS: negative for - bleeding problems, bruising or swollen lymph nodes Endocrine ROS: negative for - galactorrhea, hair pattern changes, polydipsia/polyuria or temperature intolerance Respiratory ROS: negative for - cough, hemoptysis, shortness of breath or wheezing Cardiovascular ROS: negative for - chest pain, dyspnea on exertion, edema or irregular heartbeat Gastrointestinal ROS: negative for - abdominal pain, diarrhea, hematemesis, nausea/vomiting or stool incontinence Genito-Urinary ROS: negative for - dysuria, hematuria, incontinence or urinary frequency/urgency Musculoskeletal ROS: negative for - joint swelling or muscular weakness Neurological ROS: as noted in HPI Dermatological ROS: negative for rash and skin lesion changes   Blood pressure  198/124, pulse 76, temperature 98.7 F (37.1 C), temperature source Oral, resp. rate 22, SpO2 97 %.   Neurologic Examination:                                                                                                      HEENT-  Normocephalic, no lesions, without obvious abnormality.  Normal external eye and conjunctiva.  Normal TM's bilaterally.  Normal auditory canals and external ears. Normal external nose, mucus membranes and septum.  Normal pharynx. Cardiovascular- regular rate and rhythm, S1, S2 normal, no murmur, click, rub or gallop, pulses palpable throughout   Lungs- chest clear, no wheezing, rales, normal symmetric air entry, Heart exam - S1, S2 normal, no murmur, no gallop, rate regular Abdomen- soft, non-tender; bowel sounds normal; no masses,  no organomegaly Extremities- no edema   Neurological Examination Mental Status: Alert, oriented, thought content appropriate.  Mild dysarthria.    Cranial Nerves: II: Visual fields grossly normal, pupils equal, round, reactive to light and accommodation, R gaze preference III,IV, VI: ptosis not present, extra-ocular motions intact bilaterally V,VII: L facial droop VIII: hearing normal bilaterally IX,X: uvula rises symmetrically XI: bilateral shoulder shrug  XII: midline tongue extension Motor: Right : Upper extremity   5/5    Left:     Upper extremity   2/5  Lower extremity   5/5     Lower extremity   3/5 Tone and bulk:normal tone throughout; no atrophy noted Sensory: diminished in LUE/LLE    Lab Results: Basic Metabolic Panel:  Recent Labs Lab 04/04/15 1824  NA 141  K 3.8  CL 104  GLUCOSE 113*  BUN 6  CREATININE 0.70    Liver Function Tests: No results for input(s): AST, ALT, ALKPHOS, BILITOT, PROT, ALBUMIN in the last 168 hours. No results for input(s): LIPASE, AMYLASE in the last 168 hours. No results for input(s): AMMONIA in the last 168 hours.  CBC:  Recent Labs Lab 04/04/15 1818 04/04/15 1824   WBC 6.4  --   NEUTROABS 5.3  --   HGB 14.4 17.7*  HCT 45.0 52.0  MCV 86.5  --   PLT 164  --     Cardiac Enzymes: No results for input(s): CKTOTAL, CKMB, CKMBINDEX, TROPONINI in the last 168 hours.  Lipid Panel: No results for input(s): CHOL, TRIG, HDL, CHOLHDL, VLDL, LDLCALC in the last 168 hours.  CBG: No results for input(s): GLUCAP in the last 168 hours.  Microbiology: Results for orders placed or performed during the hospital encounter of 04/11/12  MRSA PCR Screening     Status: None   Collection Time: 04/12/12 12:23 AM  Result Value Ref Range Status   MRSA by PCR NEGATIVE NEGATIVE Final    Comment:        The GeneXpert MRSA Assay (FDA approved for NASAL specimens only), is one component of a comprehensive MRSA colonization surveillance program. It is not intended to diagnose MRSA infection nor to guide or monitor treatment for MRSA infections.    Coagulation Studies: No results for input(s): LABPROT, INR in the last 72 hours.  Imaging: No results found.     Assessment/Plan:   64 yo male with a hx of stroke and CAD s/p stenting on asa and plavix who presents with acute onset of L sided weakness and found to have Large R ICH on CTH  - admit to Neuro ICU - SBP < 140. On cardene  - Mannitol 70g x 1 dose now - Mannitol 40g q6hrs for cerebral edema, hold for Na > 155 and Osm > 320 - Na q6hr , Osm q6hrs - Nsurg consult to eval bleed - NPo until speech eval - PT/OT - Hold asa/plavix - Hold sub q heparin - SCD's - repeat CTH in the am

## 2015-04-05 ENCOUNTER — Inpatient Hospital Stay (HOSPITAL_COMMUNITY): Payer: Medicare Other

## 2015-04-05 LAB — OSMOLALITY
OSMOLALITY: 307 mosm/kg — AB (ref 275–295)
OSMOLALITY: 302 mosm/kg — AB (ref 275–295)
OSMOLALITY: 305 mosm/kg — AB (ref 275–295)
OSMOLALITY: 294 mosm/kg (ref 275–295)

## 2015-04-05 LAB — GLUCOSE, CAPILLARY: GLUCOSE-CAPILLARY: 99 mg/dL (ref 65–99)

## 2015-04-05 LAB — SODIUM
SODIUM: 142 mmol/L (ref 135–145)
SODIUM: 141 mmol/L (ref 135–145)
SODIUM: 142 mmol/L (ref 135–145)
Sodium: 142 mmol/L (ref 135–145)

## 2015-04-05 MED ORDER — NICARDIPINE HCL IN NACL 20-0.86 MG/200ML-% IV SOLN
3.0000 mg/h | Freq: Once | INTRAVENOUS | Status: AC
Start: 2015-04-05 — End: 2015-04-05

## 2015-04-05 MED ORDER — CETYLPYRIDINIUM CHLORIDE 0.05 % MT LIQD
7.0000 mL | Freq: Two times a day (BID) | OROMUCOSAL | Status: DC
  Administered 2015-04-05 – 2015-04-10 (×11): 7 mL via OROMUCOSAL

## 2015-04-05 MED ORDER — NICARDIPINE HCL IN NACL 20-0.86 MG/200ML-% IV SOLN: 3.0000 mg/h | Freq: Once | INTRAVENOUS | Status: DC

## 2015-04-05 NOTE — Progress Notes (Signed)
STROKE TEAM PROGRESS NOTE   HISTORY Randall Patel is an 64 y.o. male with a hx of stroke and CAD s/p stenting on asa and plavix who presents with acute onset of L sided weakness and found to have Large R ICH on CTH  No TPA secondary to hemorrhage.   SUBJECTIVE (INTERVAL HISTORY) No overnight events. No family at bedside. Patient's mental status has been improving. No confusion overnight. He was placed on mannitol q6h by neurohospitalist. Failed bedside swallow.    OBJECTIVE Temp:  [97.7 F (36.5 C)-98.9 F (37.2 C)] 98.6 F (37 C) (12/24 0752) Pulse Rate:  [74-157] 77 (12/24 0645) Cardiac Rhythm:  [-] Normal sinus rhythm (12/24 0600) Resp:  [12-34] 26 (12/24 0645) BP: (117-201)/(79-128) 150/87 mmHg (12/24 0645) SpO2:  [92 %-100 %] 97 % (12/24 0645) Weight:  [71.1 kg (156 lb 12 oz)] 71.1 kg (156 lb 12 oz) (12/23 1930)  CBC:  Recent Labs Lab 04/04/15 1818 04/04/15 1824  WBC 6.4  --   NEUTROABS 5.3  --   HGB 14.4 17.7*  HCT 45.0 52.0  MCV 86.5  --   PLT 164  --     Basic Metabolic Panel:  Recent Labs Lab 04/04/15 1818 04/04/15 1824 04/05/15 04/05/15 0631  NA 139 141 142 142  K 4.1 3.8  --   --   CL 104 104  --   --   CO2 20*  --   --   --   GLUCOSE 115* 113*  --   --   BUN 6 6  --   --   CREATININE 0.82 0.70  --   --   CALCIUM 9.8  --   --   --     Lipid Panel:    Component Value Date/Time   CHOL 204* 07/27/2013 1524   TRIG 116.0 07/27/2013 1524   HDL 95.30 07/27/2013 1524   CHOLHDL 2 07/27/2013 1524   VLDL 23.2 07/27/2013 1524   LDLCALC 86 07/27/2013 1524   HgbA1c:  Lab Results  Component Value Date   HGBA1C 6.4* 04/03/2012   Urine Drug Screen:    Component Value Date/Time   LABOPIA NONE DETECTED 04/04/2015 1755   COCAINSCRNUR NONE DETECTED 04/04/2015 1755   LABBENZ NONE DETECTED 04/04/2015 1755   AMPHETMU NONE DETECTED 04/04/2015 1755   THCU POSITIVE* 04/04/2015 1755   LABBARB NONE DETECTED 04/04/2015 1755      IMAGING  CT head   04/05/2015: 1. Large RIGHT frontal intracranial hemorrhage is slightly contracted in volume. 2. Decrease in severity of mile leftward midline shift. 3. No new intracranial hemorrhage. 4. No extension to the ventricular system. 5. Basal cisterns are patent.  Ct Head Wo Contrast  04/04/2015   Large acute right frontoparietal intracranial hemorrhage, with evidence of active extravasation and 8 mm leftward midline shift. No evidence of downward herniation.    PHYSICAL EXAM HEENT- Normocephalic, no lesions, without obvious abnormality. Normal external eye and conjunctiva.   Cardiovascular- regular rate and rhythm, S1, S2 normal, no murmur, click, rub or gallop, pulses palpable throughout   Lungs- chest clear, no wheezing, rales, normal symmetric air entry  Extremities- no edema   Neurological Examination Mental Status: Alert, oriented to name, date, day, month, year, thought content appropriate.dysarthria.Naming intact. No aphasia.   Cranial Nerves: II: Visual fields grossly normal, pupils equal, round, reactive to light, R gaze preference III,IV, VI: ptosis not present, extra-ocular movements intact bilaterally V,VII: L facial droop, intact sensory bilat VIII: hearing normal to voice bilaterally  IX,X: uvula rises symmetrically XI: bilateral shoulder shrug XII: midline tongue extension Motor: Right :Upper 5/5Left:Upper1/5 Right Lower5/5Left Lower extremity 3+/5 Tone: Decreased tone left arm Sensory: diminished in LUE/LLE No extinction of the upper or lower extremities Coordination: No dysmetria. Cannot test left arm due to weakness     ASSESSMENT/PLAN Randall Patel is a 64 y.o. male with history of hypertension, coronary artery disease with previous MI, previous CVA, ischemic cardiomyopathy - EF 35-40%, hyperlipidemia, claudication, tobacco use, and alcohol abuse presenting with left hemiparesis. He did not receive IV t-PA due to intercranial  hemorrhage.  Stroke:  Large acute right frontoparietal intracranial hemorrhage.  Resultant  Left hemiparesis, dysarthria  MRI  not performed  MRA  not performed  Carotid Doppler  not ordered  2D Echo EF 35-40%. No cardiac source of emboli identified.  LDL - 86  HgbA1c 6.4  VTE prophylaxis - SCDs  Diet NPO time specified  aspirin 81 mg daily and clopidogrel 75 mg daily prior to admission, now on No antithrombotic secondary to hemorrhage.  Patient counseled to be compliant with his antithrombotic medications  Ongoing aggressive stroke risk factor management  Therapy recommendations: Pending  Disposition: Pending  Hypertension  Stable   Hyperlipidemia  Home meds:  Lipitor 80 mg daily prior to admission - held secondary to ICH and NPO.  LDL 86, goal < 70  Continue statin at discharge    Other Stroke Risk Factors  Advanced age  Cigarette smoker, advised to stop smoking  ETOH use  Hx stroke/TIA  Family hx stroke (sister)  Coronary artery disease   Other Active Problems  UDS - positive for THCU  EF 35-40%  PLAN  Repeat head CT - ordered, stable  Was started on mannitol by neurohospitalist.   Repeat CT on the 25th, if edema or shift increases consider starting hypertonic saline in place of Mannitol.    Hospital day # 1   Personally examined patient and images, and have participated in and made any corrections needed to history, physical, neuro exam,assessment and plan as stated above.  I have personally obtained the history, evaluated lab date, reviewed imaging studies and agree with radiology interpretations.   This patient is critically ill and at significant risk of neurological worsening, death and care requires constant monitoring of vital signs, hemodynamics,respiratory and cardiac monitoring,review of multiple databases, neurological assessment, discussion with family, other specialists and medical decision making of high  complexity.  I spent 30 minutes of neurocritical care time in the care of this patient.  Naomie Dean, MD [facility] Pager: [phone] 07/05/2014 5:09 PM     Naomie Dean, MD Stroke Neurology [phone] [facility]    To contact Stroke Continuity provider, please refer to [email or web address]. After hours, contact General Neurology

## 2015-04-05 NOTE — Progress Notes (Signed)
PT Cancellation Note  Patient Details Name: Randall Patel MRN: [id number] DOB: May 07, 1950   Cancelled Treatment:    Reason Eval/Treat Not Completed: Medical issues which prohibited therapy (on bedrest- nursing asked to HOLD today.)   Tawni Millers F 04/05/2015, 10:04 AM  [facility] [phone] (pager)

## 2015-04-05 NOTE — Evaluation (Signed)
Clinical/Bedside Swallow Evaluation Patient Details  Name: Randall Patel MRN: [id number] Date of Birth: June 13, 1950  Today's Date: 04/05/2015 Time: SLP Start Time (ACUTE ONLY): 1415 SLP Stop Time (ACUTE ONLY): 1431 SLP Time Calculation (min) (ACUTE ONLY): 16 min  Past Medical History:  Past Medical History  Diagnosis Date  . Hypertension   . Coronary artery disease     MI in 2008 with PCI to ? vessel; NSTEMI 03/2012; NSTEMI 04/2012 Cath showed triple vessel dz including occluded RCA, s/p DES to distal RCA, PTCA posterolateral branch   . CVA (cerebral infarction)     03/2012 with left arm weakness/numbness and left facial droop.  . Ischemic cardiomyopathy     04/2012 EF 35-40%  . Hyperlipidemia   . Claudication (HCC)   . Tobacco abuse     quit 04/06/12  . ETOH abuse     quit 04/06/12   Past Surgical History:  Past Surgical History  Procedure Laterality Date  . Hernia repair    . Left heart catheterization with coronary angiogram N/A 04/11/2012    Procedure: LEFT HEART CATHETERIZATION WITH CORONARY ANGIOGRAM;  Surgeon: Kathleene Hazel, MD;  Location: [facility] CATH LAB;  Service: Cardiovascular;  Laterality: N/A;  . Percutaneous coronary stent intervention (pci-s)  04/11/2012    Procedure: PERCUTANEOUS CORONARY STENT INTERVENTION (PCI-S);  Surgeon: Kathleene Hazel, MD;  Location: [facility] CATH LAB;  Service: Cardiovascular;;  Distal RCA  . Percutaneous coronary intervention-balloon only  04/11/2012    Procedure: PERCUTANEOUS CORONARY INTERVENTION-BALLOON ONLY;  Surgeon: Kathleene Hazel, MD;  Location: [facility] CATH LAB;  Service: Cardiovascular;;  RPLA   HPI:  Randall Patel is an 64 y.o. male with a hx of stroke and CAD s/p stenting presents with acute onset of L sided weakness and found to have Large R ICH on CT. PMH: HTN, CAD, ETOH abuse, tobacco abuse.   Assessment / Plan / Recommendation Clinical Impression  Significant left labial weakness with leakage bilaterally.  Decreased oral manipulation and holding prior to transiting. Suspect delayed swallow initiation. Silent aspiration cannot be ruled out with bedside swallow assessment, however no cough, wet vocal quality or throat clear present. Pt alert, conversive, aware of self/surroundings and situation although not completely cognitively intact. Recommend Dys 1, nectar thick liquds (as precaution), no straws, check left side oral cavity for pocketing, placee utensil/cup on right side oral cavity, crush pills and full supervision. Pt may require objective assessment.      Aspiration Risk  Moderate aspiration risk    Diet Recommendation Dysphagia 1 (Puree);Nectar-thick liquid   Liquid Administration via: Cup;No straw Medication Administration: Crushed with puree Supervision: Patient able to self feed;Full supervision/cueing for compensatory strategies Compensations: Slow rate;Small sips/bites;Lingual sweep for clearance of pocketing;Monitor for anterior loss Postural Changes: Seated upright at 90 degrees    Other  Recommendations Oral Care Recommendations: Oral care BID   Follow up Recommendations   (TBD)    Frequency and Duration min 2x/week  2 weeks       Prognosis Prognosis for Safe Diet Advancement: Good Barriers to Reach Goals: Cognitive deficits      Swallow Study   General HPI: Randall Patel is an 64 y.o. male with a hx of stroke and CAD s/p stenting presents with acute onset of L sided weakness and found to have Large R ICH on CT. PMH: HTN, CAD, ETOH abuse, tobacco abuse. Type of Study: Bedside Swallow Evaluation Previous Swallow Assessment:  (no prior BSE) Diet Prior to this Study: NPO Temperature Spikes Noted: Yes  Respiratory Status: Nasal cannula History of Recent Intubation: No Behavior/Cognition: Alert;Cooperative;Pleasant mood;Requires cueing Oral Cavity Assessment: Within Functional Limits Oral Care Completed by SLP: Yes Oral Cavity - Dentition: Adequate natural dentition  (missing 2 upper teeth) Vision: Functional for self-feeding (? left neglect ?) Self-Feeding Abilities: Able to feed self Patient Positioning: Upright in bed Baseline Vocal Quality: Normal Volitional Cough: Strong Volitional Swallow: Able to elicit    Oral/Motor/Sensory Function Overall Oral Motor/Sensory Function: Severe impairment Facial ROM: Reduced left Facial Symmetry: Abnormal symmetry left Facial Strength: Reduced left Facial Sensation: Reduced left Lingual ROM: Reduced left Lingual Symmetry: Abnormal symmetry left Lingual Strength: Reduced Mandible: Within Functional Limits   Ice Chips Ice chips: Impaired Presentation: Spoon Oral Phase Impairments: Reduced lingual movement/coordination Oral Phase Functional Implications: Prolonged oral transit Pharyngeal Phase Impairments: Suspected delayed Swallow   Thin Liquid Thin Liquid: Impaired Presentation: Spoon;Cup Oral Phase Impairments: Reduced labial seal Oral Phase Functional Implications: Left anterior spillage Pharyngeal  Phase Impairments: Suspected delayed Swallow    Nectar Thick Nectar Thick Liquid: Impaired Presentation: Cup Oral Phase Impairments: Reduced labial seal Oral phase functional implications: Right anterior spillage;Left anterior spillage;Prolonged oral transit Pharyngeal Phase Impairments: Suspected delayed Swallow   Honey Thick Honey Thick Liquid: Not tested   Puree Puree: Impaired Oral Phase Impairments: Reduced lingual movement/coordination   Solid Solid: Not tested       Royce Macadamia 04/05/2015,2:54 PM  Breck Coons Lonell Face.Ed [employer] [phone]

## 2015-04-05 NOTE — Progress Notes (Signed)
OT Cancellation Note  Patient Details Name: Randall Patel MRN: [id number] DOB: Oct 13, 1950   Cancelled Treatment:    Reason Eval/Treat Not Completed: Other (comment). Pt currently on bedrest. Will need increased activity order to eval pt.  Evette Georges [phone] 04/05/2015, 7:21 AM

## 2015-04-06 DIAGNOSIS — I10 Essential (primary) hypertension: Secondary | ICD-10-CM

## 2015-04-06 LAB — OSMOLALITY
OSMOLALITY: 295 mosm/kg (ref 275–295)
Osmolality: 301 mOsm/kg — ABNORMAL HIGH (ref 275–295)
Osmolality: 298 mOsm/kg — ABNORMAL HIGH (ref 275–295)

## 2015-04-06 LAB — SODIUM
SODIUM: 142 mmol/L (ref 135–145)
SODIUM: 143 mmol/L (ref 135–145)
Sodium: 143 mmol/L (ref 135–145)
Sodium: 141 mmol/L (ref 135–145)

## 2015-04-06 NOTE — Progress Notes (Addendum)
STROKE TEAM PROGRESS NOTE   HISTORY Randall Patel is an 64 y.o. male with a hx of stroke and CAD s/p stenting on asa and plavix who presents with acute onset of L sided weakness and found to have Large R ICH on CTH  No TPA secondary to hemorrhage.   SUBJECTIVE (INTERVAL HISTORY) vernight events. No family at bedside. He continues on mannitol q6h by neurohospitalist. Failed bedside swallow.  BJECTIVE Temp:  [98.6 F (37 C)-100.5 F (38.1 C)] 98.6 F (37 C) (12/25 0353) Pulse Rate:  [52-114] 52 (12/25 0600) Cardiac Rhythm:  [-] Normal sinus rhythm (12/24 2000) Resp:  [13-26] 20 (12/25 0600) BP: (119-177)/(71-116) 146/84 mmHg (12/25 0600) SpO2:  [97 %-100 %] 98 % (12/25 0600)  CBC:   Recent Labs Lab 04/04/15 1818 04/04/15 1824  WBC 6.4  --   NEUTROABS 5.3  --   HGB 14.4 17.7*  HCT 45.0 52.0  MCV 86.5  --   PLT 164  --     Basic Metabolic Panel:  Recent Labs Lab 04/04/15 1818 04/04/15 1824  04/05/15 1817 04/06/15 0024  NA 139 141  < > 142 142  K 4.1 3.8  --   --   --   CL 104 104  --   --   --   CO2 20*  --   --   --   --   GLUCOSE 115* 113*  --   --   --   BUN 6 6  --   --   --   CREATININE 0.82 0.70  --   --   --   CALCIUM 9.8  --   --   --   --   < > = values in this interval not displayed.  Lipid Panel:     Component Value Date/Time   CHOL 204* 07/27/2013 1524   TRIG 116.0 07/27/2013 1524   HDL 95.30 07/27/2013 1524   CHOLHDL 2 07/27/2013 1524   VLDL 23.2 07/27/2013 1524   LDLCALC 86 07/27/2013 1524   HgbA1c:  Lab Results  Component Value Date   HGBA1C 6.4* 04/03/2012   Urine Drug Screen:     Component Value Date/Time   LABOPIA NONE DETECTED 04/04/2015 1755   COCAINSCRNUR NONE DETECTED 04/04/2015 1755   LABBENZ NONE DETECTED 04/04/2015 1755   AMPHETMU NONE DETECTED 04/04/2015 1755   THCU POSITIVE* 04/04/2015 1755   LABBARB NONE DETECTED 04/04/2015 1755      IMAGING  CT head  04/05/2015: 1. Large RIGHT frontal intracranial hemorrhage  is slightly contracted in volume. 2. Decrease in severity of mild leftward midline shift. 3. No new intracranial hemorrhage. 4. No extension to the ventricular system. 5. Basal cisterns are patent.  Ct Head Wo Contrast  04/04/2015   Large acute right frontoparietal intracranial hemorrhage, with evidence of active extravasation and 8 mm leftward midline shift. No evidence of downward herniation.    PHYSICAL EXAM HEENT- Normocephalic, no lesions, without obvious abnormality. Normal external eye and conjunctiva.   Cardiovascular- regular rate and rhythm, S1, S2 normal, no murmur, click, rub or gallop, pulses palpable throughout   Lungs- chest clear, no wheezing, rales, normal symmetric air entry  Extremities- no edema   Neurological Examination Mental Status: Alert, oriented to name, date, day, month, year, thought content appropriate.dysarthria.Naming intact. No aphasia.   Cranial Nerves: II: Visual fields grossly normal, pupils equal, round, reactive to light, R gaze preference III,IV, VI: ptosis not present, extra-ocular movements intact bilaterally V,VII: L facial droop, intact  sensory bilat VIII: hearing normal to voice bilaterally IX,X: uvula rises symmetrically XI: bilateral shoulder shrug XII: midline tongue extension Motor: Right :Upper 5/5Left:Upper1/5 Right Lower5/5Left Lower extremity 3+/5 Tone: Decreased tone left arm Sensory: diminished in LUE/LLE No extinction of the upper or lower extremities Coordination: No dysmetria. Cannot test left arm due to weakness     ASSESSMENT/PLAN [name] is a 64 y.o. male with history of hypertension, coronary artery disease with previous MI, previous CVA, ischemic cardiomyopathy - EF 35-40%, hyperlipidemia, claudication, tobacco use, and alcohol abuse presenting with left hemiparesis. He did not receive IV t-PA due to intercranial hemorrhage.  Stroke:  Large acute right frontoparietal  intracranial hemorrhage.  Resultant  Left hemiparesis, dysarthria  MRI  not performed  MRA  not performed  Carotid Doppler  not ordered  2D Echo EF 35-40%. No cardiac source of emboli identified.  LDL - 86  HgbA1c 6.4  VTE prophylaxis - SCDs DIET - DYS 1 Room service appropriate?: Yes; Fluid consistency:: Nectar Thick  aspirin 81 mg daily and clopidogrel 75 mg daily prior to admission, now on No antithrombotic secondary to hemorrhage.  Patient counseled to be compliant with his antithrombotic medications  Ongoing aggressive stroke risk factor management  Therapy recommendations: Pending  Disposition: Pending  Hypertension  Continue to monitor and adjust medicatiosn as needed.   Hyperlipidemia  Home meds:  Lipitor 80 mg daily prior to admission - held secondary to ICH and NPO.  LDL 86, goal < 70  Continue statin at discharge    Other Stroke Risk Factors  Advanced age  Cigarette smoker, advised to stop smoking  ETOH use  Hx stroke/TIA  Family hx stroke (sister)  Coronary artery disease   Other Active Problems  UDS - positive for THCU  EF 35-40%  PLAN  Continue Mannitol   Repeat CT on today showed increased blood in the 4th ventricle desipte the fact if edema or shift increases consider starting hypertonic saline in place of Mannitol.   Hospital day # 2  Personally examined patient and images, and have participated in and made any corrections needed to history, physical, neuro exam,assessment and plan as stated above.  I have personally obtained the history, evaluated lab date, reviewed imaging studies and agree with radiology interpretations.   This patient is critically ill and at significant risk of neurological worsening, death and care requires constant monitoring of vital signs, hemodynamics,respiratory and cardiac monitoring,review of multiple databases, neurological assessment, discussion with family, other specialists and medical  decision making of high complexity.  I spent 35 minutes of neurocritical care time in the care of this patient.   ATTENDING: Dr. Aida Raider. Earl Lites   To contact Stroke Continuity provider, please refer to [email or web address]. After hours, contact General Neurology

## 2015-04-07 ENCOUNTER — Inpatient Hospital Stay (HOSPITAL_COMMUNITY): Payer: Medicare Other

## 2015-04-07 DIAGNOSIS — I6789 Other cerebrovascular disease: Secondary | ICD-10-CM

## 2015-04-07 DIAGNOSIS — G936 Cerebral edema: Secondary | ICD-10-CM

## 2015-04-07 DIAGNOSIS — E785 Hyperlipidemia, unspecified: Secondary | ICD-10-CM

## 2015-04-07 DIAGNOSIS — I160 Hypertensive urgency: Secondary | ICD-10-CM

## 2015-04-07 DIAGNOSIS — F17200 Nicotine dependence, unspecified, uncomplicated: Secondary | ICD-10-CM

## 2015-04-07 LAB — BASIC METABOLIC PANEL
ANION GAP: 12 (ref 5–15)
BUN: <5 mg/dL — ABNORMAL LOW (ref 6–20)
CHLORIDE: 109 mmol/L (ref 101–111)
CO2: 22 mmol/L (ref 22–32)
Calcium: 9.7 mg/dL (ref 8.9–10.3)
Creatinine, Ser: 0.8 mg/dL (ref 0.61–1.24)
GFR calc non Af Amer: >60 mL/min (ref >60)
Glucose, Bld: 117 mg/dL — ABNORMAL HIGH (ref 65–99)
POTASSIUM: 3.4 mmol/L — AB (ref 3.5–5.1)
Sodium: 143 mmol/L (ref 135–145)

## 2015-04-07 LAB — SODIUM
SODIUM: 143 mmol/L (ref 135–145)
Sodium: 143 mmol/L (ref 135–145)
Sodium: 143 mmol/L (ref 135–145)
Sodium: 143 mmol/L (ref 135–145)

## 2015-04-07 LAB — LIPID PANEL
CHOL/HDL RATIO: 1.9 ratio
Cholesterol: 179 mg/dL (ref 0–200)
HDL: 96 mg/dL (ref >40)
LDL CALC: 70 mg/dL (ref 0–99)
TRIGLYCERIDES: 67 mg/dL (ref <150)
VLDL: 13 mg/dL (ref 0–40)

## 2015-04-07 LAB — CBC
HEMATOCRIT: 44.5 % (ref 39.0–52.0)
HEMOGLOBIN: 14 g/dL (ref 13.0–17.0)
MCH: 28 pg (ref 26.0–34.0)
MCHC: 31.5 g/dL (ref 30.0–36.0)
MCV: 89 fL (ref 78.0–100.0)
Platelets: 156 10*3/uL (ref 150–400)
RBC: 5 MIL/uL (ref 4.22–5.81)
RDW: 15.3 % (ref 11.5–15.5)
WBC: 7.9 10*3/uL (ref 4.0–10.5)

## 2015-04-07 LAB — OSMOLALITY
OSMOLALITY: 303 mosm/kg — AB (ref 275–295)
OSMOLALITY: 293 mosm/kg (ref 275–295)

## 2015-04-07 LAB — TSH: TSH: 0.557 u[IU]/mL (ref 0.350–4.500)

## 2015-04-07 LAB — VITAMIN B12: Vitamin B-12: 491 pg/mL (ref 180–914)

## 2015-04-07 MED ORDER — SODIUM CHLORIDE 0.9 % IV BOLUS (SEPSIS)
500.0000 mL | Freq: Once | INTRAVENOUS | Status: AC
  Administered 2015-04-07: 500 mL via INTRAVENOUS

## 2015-04-07 MED ORDER — HEPARIN SODIUM (PORCINE) 5000 UNIT/ML IJ SOLN: 5000.0000 [IU] | Freq: Two times a day (BID) | INTRAMUSCULAR | Status: DC

## 2015-04-07 MED ORDER — CARVEDILOL 6.25 MG PO TABS
6.2500 mg | ORAL_TABLET | Freq: Two times a day (BID) | ORAL | Status: DC
  Administered 2015-04-07 – 2015-04-10 (×6): 6.25 mg via ORAL
  Filled 2015-04-07 (×6): qty 1

## 2015-04-07 MED ORDER — SODIUM CHLORIDE 3 % IV SOLN
INTRAVENOUS | Status: DC
  Administered 2015-04-07: 50 mL/h via INTRAVENOUS
  Administered 2015-04-08 (×4): 75 mL/h via INTRAVENOUS
  Administered 2015-04-09: 37.5 mL/h via INTRAVENOUS
  Administered 2015-04-09: 75 mL/h via INTRAVENOUS
  Filled 2015-04-07 (×13): qty 500

## 2015-04-07 MED ORDER — HEPARIN SODIUM (PORCINE) 5000 UNIT/ML IJ SOLN
5000.0000 [IU] | Freq: Three times a day (TID) | INTRAMUSCULAR | Status: DC
  Administered 2015-04-07 – 2015-04-10 (×8): 5000 [IU] via SUBCUTANEOUS
  Filled 2015-04-07 (×8): qty 1

## 2015-04-07 MED ORDER — LOSARTAN POTASSIUM 50 MG PO TABS
25.0000 mg | ORAL_TABLET | Freq: Every day | ORAL | Status: DC
  Administered 2015-04-07: 25 mg via ORAL
  Filled 2015-04-07: qty 1

## 2015-04-07 MED ORDER — PANTOPRAZOLE SODIUM 40 MG PO TBEC
40.0000 mg | DELAYED_RELEASE_TABLET | Freq: Every day | ORAL | Status: DC
  Administered 2015-04-07 – 2015-04-09 (×3): 40 mg via ORAL
  Filled 2015-04-07 (×3): qty 1

## 2015-04-07 NOTE — Progress Notes (Signed)
OT Cancellation Note  Patient Details Name: Randall Patel MRN: [id number] DOB: Jun 01, 1950   Cancelled Treatment:    Reason Eval/Treat Not Completed: Patient not medically ready (Pt with bedrest order.  Will continue to follow.)  Evern Bio 04/07/2015, 8:02 AM

## 2015-04-07 NOTE — Evaluation (Signed)
Occupational Therapy Evaluation Patient Details Name: Randall Patel MRN: [id number] DOB: 10-12-1950 Today's Date: 04/07/2015    History of Present Illness pt presents with Large R Frontoparietal ICH.  pt with hx of HTN, MI, CAD, CVA, and Etoh.     Clinical Impression   Pt was independent prior to admission.  Presents with L sided neglect and weakness (UE greater than LE),decreased balance and impaired cognition. Educated pt in importance of positioning L UE to prevent injury and family in stimulation of pt on L side. Pt needs intensive rehab prior to return home.  Recommending CIR.    Follow Up Recommendations  CIR    Equipment Recommendations   (to be determined in next venue)    Recommendations for Other Services       Precautions / Restrictions Precautions Precautions: Fall Restrictions Weight Bearing Restrictions: No      Mobility Bed Mobility Overal bed mobility: Needs Assistance Bed Mobility: Rolling;Supine to Sit Rolling: Max assist   Supine to sit: Min assist;+2 for physical assistance;HOB elevated     General bed mobility comments: pt needs increased A with rolling to R side, but participates well with R UE to bring self up to sitting.  pt with posterior lean initially in sitting and with cueing is able to correct.    Transfers Overall transfer level: Needs assistance Equipment used: 2 person hand held assist Transfers: Sit to/from [employer] Sit to Stand: Mod assist;+2 physical assistance Stand pivot transfers: Min assist;+2 physical assistance       General transfer comment: pt leans to L side with coming to standing.  cues for balance and step-by-step through pivot to recliner.  pt able to independently move L LE without A.      Balance Overall balance assessment: Needs assistance Sitting-balance support: Single extremity supported;Feet supported Sitting balance-Leahy Scale: Fair     Standing balance support: No upper extremity  supported;During functional activity Standing balance-Leahy Scale: Poor                              ADL Overall ADL's : Needs assistance/impaired Eating/Feeding: Minimal assistance;Sitting   Grooming: Wash/dry face;Sitting;Minimal assistance   Upper Body Bathing: Sitting;Maximal assistance   Lower Body Bathing: Sit to/from stand;Maximal assistance;+2 for physical assistance   Upper Body Dressing : Maximal assistance;Sitting   Lower Body Dressing: +2 for physical assistance;Minimal assistance;Sit to/from stand   Toilet Transfer: +2 for physical assistance;Minimal assistance;Stand-pivot;BSC   Toileting- Clothing Manipulation and Hygiene: +2 for safety/equipment;Maximal assistance;Sit to/from stand               Vision Vision Assessment?: Yes Eye Alignment: Within Functional Limits Ocular Range of Motion: Restricted on the left Alignment/Gaze Preference: Gaze right Tracking/Visual Pursuits: Decreased smoothness of horizontal tracking Additional Comments: Pt with impaired attention in busy environment, distracted during vision testing.   Perception Perception Perception Tested?: Yes Perception Deficits: Inattention/neglect Inattention/Neglect: Does not attend to left side of body Comments: peripheral fields appear intact, decreased attention to L hemispace, educated family to speak to pt from L side   Praxis      Pertinent Vitals/Pain Pain Assessment: Faces Faces Pain Scale: Hurts a little bit Pain Location: central line site Pain Descriptors / Indicators: Sore     Hand Dominance Right   Extremity/Trunk Assessment Upper Extremity Assessment Upper Extremity Assessment: LUE deficits/detail LUE Deficits / Details: no active movement LUE Sensation: decreased proprioception (neglect) LUE Coordination: decreased fine motor;decreased gross  motor   Lower Extremity Assessment Lower Extremity Assessment: Defer to PT evaluation LLE Deficits / Details:  Strength grossly 2/5 with intact sensation.   LLE Coordination: decreased fine motor;decreased gross motor   Cervical / Trunk Assessment Cervical / Trunk Assessment: Normal   Communication Communication Communication: No difficulties   Cognition Arousal/Alertness: Awake/alert Behavior During Therapy: Flat affect Overall Cognitive Status: Impaired/Different from baseline Area of Impairment: Attention;Safety/judgement;Awareness;Problem solving   Current Attention Level: Selective     Safety/Judgement: Decreased awareness of safety;Decreased awareness of deficits Awareness: Emergent Problem Solving: Slow processing;Decreased initiation;Difficulty sequencing;Requires verbal cues;Requires tactile cues General Comments: pt with L sided neglect and when cueing pt to attend to L side, pt states "I can see her."  pt able to state deficits, but poor awareness of implications.     General Comments       Exercises       Shoulder Instructions      Home Living Family/patient expects to be discharged to:: Private residence Living Arrangements: Other relatives (sister) Available Help at Discharge: Family;Available PRN/intermittently;Friend(s) (sister works, girlfriend has a flexible work schedule) Type of Home: House Home Access: Stairs to enter Secretary/administrator of Steps: 4 Entrance Stairs-Rails: Left Home Layout: One level     Bathroom Shower/Tub: Chief Strategy Officer: Standard     Home Equipment: None   Additional Comments: drives      Prior Functioning/Environment Level of Independence: Independent        Comments: drives    OT Diagnosis: Generalized weakness;Cognitive deficits;Disturbance of vision;Hemiplegia non-dominant side   OT Problem List: Decreased strength;Decreased activity tolerance;Impaired balance (sitting and/or standing);Impaired vision/perception;Decreased coordination;Decreased cognition;Decreased safety awareness;Decreased knowledge  of use of DME or AE;Impaired UE functional use   OT Treatment/Interventions: Self-care/ADL training;Neuromuscular education;DME and/or AE instruction;Therapeutic activities;Cognitive remediation/compensation;Visual/perceptual remediation/compensation;Patient/family education;Balance training    OT Goals(Current goals can be found in the care plan section) Acute Rehab OT Goals Patient Stated Goal: Walk OT Goal Formulation: With patient Time For Goal Achievement: 04/21/15 Potential to Achieve Goals: Good ADL Goals Pt Will Perform Eating: with set-up;sitting (locating items on L side of tray) Pt Will Perform Grooming: with supervision;sitting Pt Will Perform Upper Body Bathing: with min assist;sitting Pt Will Perform Upper Body Dressing: with min assist;sitting Pt Will Transfer to Toilet: with min assist;ambulating Additional ADL Goal #1: Pt will locate, protect and position L UE with min verbal cues. Additional ADL Goal #2: Pt will sit EOB x 10 minutes with supervision as a precursor to ADL. Additional ADL Goal #3: Pt will visually locate targets on L side with minimal verbal cues.  OT Frequency: Min 3X/week   Barriers to D/C:            Co-evaluation PT/OT/SLP Co-Evaluation/Treatment: Yes Reason for Co-Treatment: Complexity of the patient's impairments (multi-system involvement);For patient/therapist safety PT goals addressed during session: Mobility/safety with mobility;Balance;Strengthening/ROM OT goals addressed during session: ADL's and self-care      End of Session Equipment Utilized During Treatment: Gait belt Nurse Communication: Mobility status  Activity Tolerance: Patient tolerated treatment well Patient left: in chair;with call bell/phone within reach;with chair alarm set;with family/visitor present   Time: 1202-1241 OT Time Calculation (min): 39 min Charges:  OT General Charges $OT Visit: 1 Procedure OT Evaluation $Initial OT Evaluation Tier I: 1  Procedure G-Codes:    Evern Bio 04/07/2015, 1:27 PM  [phone]

## 2015-04-07 NOTE — Procedures (Signed)
Central Venous Catheter Insertion Procedure Note Randall Patel [id number] [birth year]  Procedure: Insertion of Central Venous Catheter Indications: Drug and/or fluid administration  Procedure Details Consent: Risks of procedure as well as the alternatives and risks of each were explained to the (patient/caregiver).  Consent for procedure obtained. Time Out: Verified patient identification, verified procedure, site/side was marked, verified correct patient position, special equipment/implants available, medications/allergies/relevent history reviewed, required imaging and test results available.  Performed  Maximum sterile technique was used including antiseptics, cap, gloves, gown, hand hygiene, mask and sheet. Skin prep: Chlorhexidine; local anesthetic administered A antimicrobial bonded/coated triple lumen catheter was placed in the right subclavian vein using the Seldinger technique.  Attempted Rt IJ CVL with u/s guidance.  Small caliber vessel that collapse with respiration.  Changed to Rt Trevose CVL, and placed without difficulty.  Evaluation Blood flow good Complications: No apparent complications Patient did tolerate procedure well. Chest X-ray ordered to verify placement.  CXR: pending.  Coralyn Helling, MD [facility] Pulmonary/Critical Care 04/07/2015, 10:54 AM Pager:  [phone] After 3pm call: [phone]

## 2015-04-07 NOTE — Evaluation (Signed)
Speech Language Pathology Evaluation Patient Details Name: Randall Patel MRN: [id number] DOB: 11-14-1950 Today's Date: 04/07/2015 Time: 4098-1191 SLP Time Calculation (min) (ACUTE ONLY): 13 min  Problem List:  Patient Active Problem List   Diagnosis Date Noted  . ICH (intracerebral hemorrhage) (HCC) 04/04/2015  . STEMI (ST elevation myocardial infarction) (HCC) 04/14/2012  . NSTEMI (non-ST elevated myocardial infarction) (HCC) 04/14/2012  . Cardiomyopathy, ischemic 04/14/2012  . Cerebral embolism with cerebral infarction (HCC) 04/03/2012  . Unspecified transient cerebral ischemia 04/02/2012  . Hemiplegia, unspecified, affecting nondominant side 04/02/2012  . CAD (coronary artery disease) s/p stent 2008 in [place] 04/02/2012  . Hypertension    Past Medical History:  Past Medical History  Diagnosis Date  . Hypertension   . Coronary artery disease     MI in 2008 with PCI to ? vessel; NSTEMI 03/2012; NSTEMI 04/2012 Cath showed triple vessel dz including occluded RCA, s/p DES to distal RCA, PTCA posterolateral branch   . CVA (cerebral infarction)     03/2012 with left arm weakness/numbness and left facial droop.  . Ischemic cardiomyopathy     04/2012 EF 35-40%  . Hyperlipidemia   . Claudication (HCC)   . Tobacco abuse     quit 04/06/12  . ETOH abuse     quit 04/06/12   Past Surgical History:  Past Surgical History  Procedure Laterality Date  . Hernia repair    . Left heart catheterization with coronary angiogram N/A 04/11/2012    Procedure: LEFT HEART CATHETERIZATION WITH CORONARY ANGIOGRAM;  Surgeon: Kathleene Hazel, MD;  Location: [facility] CATH LAB;  Service: Cardiovascular;  Laterality: N/A;  . Percutaneous coronary stent intervention (pci-s)  04/11/2012    Procedure: PERCUTANEOUS CORONARY STENT INTERVENTION (PCI-S);  Surgeon: Kathleene Hazel, MD;  Location: [facility] CATH LAB;  Service: Cardiovascular;;  Distal RCA  . Percutaneous coronary intervention-balloon only   04/11/2012    Procedure: PERCUTANEOUS CORONARY INTERVENTION-BALLOON ONLY;  Surgeon: Kathleene Hazel, MD;  Location: [facility] CATH LAB;  Service: Cardiovascular;;  RPLA   HPI:  Randall Patel is an 64 y.o. male with a hx of stroke and CAD s/p stenting presents with acute onset of L sided weakness and found to have Large R ICH on CT. PMH: HTN, CAD, ETOH abuse, tobacco abuse.   Assessment / Plan / Recommendation Clinical Impression  Cognitive-linguistic evaluation complete. Patient presents with decreased sustained attention to task, left sided inattention, and high level problem solving/reasoning impairments. Will benefit from SLP f/u to address above in order to maximize independence with ADLs prior to d/c. Recommend CIR after d/c.     SLP Assessment  Patient needs continued Speech Lanaguage Pathology Services    Follow Up Recommendations  Inpatient Rehab    Frequency and Duration min 2x/week  2 weeks      SLP Evaluation Prior Functioning  Cognitive/Linguistic Baseline: Information not available Type of Home: House Available Help at Discharge: Family;Available PRN/intermittently;Friend(s)   Cognition  Overall Cognitive Status: Impaired/Different from baseline Arousal/Alertness: Awake/alert Orientation Level: Oriented X4 Attention: Sustained Sustained Attention: Impaired Sustained Attention Impairment: Functional basic Memory: Appears intact Awareness: Impaired Awareness Impairment: Intellectual impairment Problem Solving: Impaired Problem Solving Impairment: Verbal complex;Functional complex Executive Function: Reasoning Reasoning: Impaired Reasoning Impairment: Verbal complex;Functional complex Behaviors:  (flat affect)    Comprehension  Auditory Comprehension Overall Auditory Comprehension: Appears within functional limits for tasks assessed Visual Recognition/Discrimination Discrimination: Not tested Reading Comprehension Reading Status:  (difficulty due to left  sided inattention/neglect, ? field cu)    Expression Expression  Primary Mode of Expression: Verbal Verbal Expression Overall Verbal Expression: Appears within functional limits for tasks assessed Written Expression Dominant Hand: Right   Oral / Motor Oral Motor/Sensory Function Overall Oral Motor/Sensory Function: Severe impairment Facial ROM: Reduced left;Suspected CN VII (facial) dysfunction Facial Symmetry: Abnormal symmetry left;Suspected CN VII (facial) dysfunction Facial Strength: Reduced left;Suspected CN VII (facial) dysfunction Facial Sensation: Suspected CN V (Trigeminal) dysfunction;Reduced left Lingual ROM: Reduced left;Suspected CN XII (hypoglossal) dysfunction Lingual Symmetry: Abnormal symmetry left;Suspected CN XII (hypoglossal) dysfunction Lingual Strength: Reduced;Suspected CN XII (hypoglossal) dysfunction Velum: Within Functional Limits Mandible: Within Functional Limits Motor Speech Overall Motor Speech: Impaired Respiration: Within functional limits Phonation: Normal Resonance: Within functional limits Articulation: Impaired Level of Impairment: Sentence Intelligibility: Intelligibility reduced Sentence: 75-100% accurate Conversation: 75-100% accurate Motor Planning: Witnin functional limits Effective Techniques: Over-articulate   [facility] MA, CCC-SLP [phone]  Randall Patel Meryl 04/07/2015, 3:52 PM

## 2015-04-07 NOTE — Progress Notes (Signed)
Rehab Admissions Coordinator Note:  Patient was screened by Trish Mage for appropriateness for an Inpatient Acute Rehab Consult.  At this time, we are recommending Inpatient Rehab consult.  Trish Mage 04/07/2015, 2:42 PM  I can be reached at [phone].

## 2015-04-07 NOTE — Progress Notes (Signed)
STROKE TEAM PROGRESS NOTE   SUBJECTIVE (INTERVAL HISTORY) Patient reports he does not have an appetite. So far poor po intake. Speech on board and so far puree diet. BP stable < 160 and will taper off cardene. Still has mannitol Q6h and will change to 3% saline.    OBJECTIVE Temp:  [97.8 F (36.6 C)-100.4 F (38 C)] 100.4 F (38 C) (12/26 0742) Pulse Rate:  [46-130] 70 (12/26 0800) Cardiac Rhythm:  [-] Normal sinus rhythm (12/25 2000) Resp:  [11-26] 24 (12/26 0800) BP: (104-189)/(68-114) 132/92 mmHg (12/26 0800) SpO2:  [97 %-100 %] 99 % (12/26 0800)  CBC:   Recent Labs Lab 04/04/15 1818 04/04/15 1824  WBC 6.4  --   NEUTROABS 5.3  --   HGB 14.4 17.7*  HCT 45.0 52.0  MCV 86.5  --   PLT 164  --     Basic Metabolic Panel:  Recent Labs Lab 04/04/15 1818 04/04/15 1824  04/07/15 0031 04/07/15 0559  NA 139 141  < > 143 143  K 4.1 3.8  --   --   --   CL 104 104  --   --   --   CO2 20*  --   --   --   --   GLUCOSE 115* 113*  --   --   --   BUN 6 6  --   --   --   CREATININE 0.82 0.70  --   --   --   CALCIUM 9.8  --   --   --   --   < > = values in this interval not displayed.  Lipid Panel:     Component Value Date/Time   CHOL 179 04/07/2015 0203   TRIG 67 04/07/2015 0203   HDL 96 04/07/2015 0203   CHOLHDL 1.9 04/07/2015 0203   VLDL 13 04/07/2015 0203   LDLCALC 70 04/07/2015 0203   HgbA1c:  Lab Results  Component Value Date   HGBA1C 6.4* 04/03/2012   Urine Drug Screen:     Component Value Date/Time   LABOPIA NONE DETECTED 04/04/2015 1755   COCAINSCRNUR NONE DETECTED 04/04/2015 1755   LABBENZ NONE DETECTED 04/04/2015 1755   AMPHETMU NONE DETECTED 04/04/2015 1755   THCU POSITIVE* 04/04/2015 1755   LABBARB NONE DETECTED 04/04/2015 1755      IMAGING I have personally reviewed the radiological images below and agree with the radiology interpretations. Blue text is my interpretation.  CT head   04/07/2015 Evolving large RIGHT frontal intraparenchymal  hematoma. Similar 3 mm RIGHT to LEFT midline shift without ventricular entrapment. However, by my reading, pt midline shift has increased to 76mm, mass effect more prominent at superior frontal region.   04/05/2015: 1. Large RIGHT frontal intracranial hemorrhage is slightly contracted in volume. 2. Decrease in severity of mile leftward midline shift. 3. No new intracranial hemorrhage. 4. No extension to the ventricular system. 5. Basal cisterns are patent.  04/04/2015   Large acute right frontoparietal intracranial hemorrhage, with evidence of active extravasation and 8 mm leftward midline shift. No evidence of downward herniation.   2D echo -  - Left ventricle: The cavity size was normal. Wall thickness was increased in a pattern of moderate LVH. Systolic function was mildly to moderately reduced. The estimated ejection fraction was in the range of 40% to 45%. There is akinesis of the mid-apicalinferolateral myocardium. Doppler parameters are consistent with abnormal left ventricular relaxation (grade 1 diastolic dysfunction). - Mitral valve: There was mild regurgitation. Impressions: -  No cardiac source of emboli was indentified. Compared to the prior study, there has been no significant interval change.  PHYSICAL EXAM Filed Vitals:   04/07/15 1606 04/07/15 1611  BP:  127/112  Pulse:  78  Temp: 98.3 F (36.8 C)   Resp:  23   HEENT- Normocephalic, no lesions, without obvious abnormality. Normal external eye and conjunctiva.   Cardiovascular- regular rate and rhythm, S1, S2 normal, no murmur, click, rub or gallop, pulses palpable throughout   Lungs- chest clear, no wheezing, rales, normal symmetric air entry  Extremities- no edema  Neurological Examination Mental Status: Alert, oriented to name, date, day, month, year, thought content appropriate.dysarthria.Naming intact. No aphasia.   Cranial Nerves: II: Visual fields grossly normal, pupils equal,  round, reactive to light, R gaze preference, but able to cross midline III,IV, VI: ptosis not present, extra-ocular movements intact bilaterally V,VII: L facial droop, intact sensory bilat VIII: hearing normal to voice bilaterally IX,X: uvula rises symmetrically XI: bilateral shoulder shrug XII: midline tongue extension Motor: Right :Upper 5/5Left:Upper0/5 Right Lower5/5Left Lower extremity 4/5 Tone: Decreased tone left arm Sensory: symmetrical b/l DTR: 1+ throughout with left babinski positive Coordination: No dysmetria on the right. Cannot test left due to weakness Gait not tested due to weakness.   ASSESSMENT/PLAN Mr. Melville Engen is a 64 y.o. male with history of hypertension, coronary artery disease with previous MI, previous CVA, ischemic cardiomyopathy - EF 35-40%, hyperlipidemia, claudication, tobacco use, and alcohol abuse presenting with left hemiparesis. He did not receive IV t-PA due to intercranial hemorrhage.  Stroke:  Large acute right frontoparietal ICH with cerebral edema in setting of hypertension.   Resultant  Left hemiparesis, dysarthria, dysphagia  MRI with contrast and MRA head once blood absorbed  Repeat CT this am increased midline shift and mass effect  Carotid Doppler  not ordered  2D Echo EF 35-40%. No cardiac source of emboli identified.  LDL - 86  HgbA1c 6.4  VTE prophylaxis - heparin subq   aspirin 81 mg daily and clopidogrel 75 mg daily prior to admission, now on No antithrombotic secondary to hemorrhage.  Ongoing aggressive stroke risk factor management  Therapy recommendations: Pending. Ok to be OOB, therapy evals.  Disposition: Pending  Cerebral edema  Place central line (have asked CCM to place).   Once in place, will discontinue mannitol and start 3% at 50/hr.  Na Q6h  Goal Na 150-155  Hx stroke/TIA, encephalomalacia  03/2012 R cortical motor strip infarcts  R frontal encephalomalacia  Concerning for  embolic infarcts  Consider outpt 30 day cardiac event monitoring to look for embolic source  Dysphagia  Secondary to hemorrhage  DIET - DYS 1 Room service appropriate?: Yes; Fluid consistency:: Nectar Thick   ST following  Add ensure pudding (kozyshack)  Increase IVF NS to 30cc/h  Hypertensive Urgency  Stable this am  On cardene drip  Increase SBP goal to < 160  Wean cardene as able  Resumed home po medications - coreg and losartan.  Hyperlipidemia  Home meds:  Lipitor 80 mg daily prior to admission - held secondary to ICH.  LDL 86  Continue statin at discharge  Tobacco abuse  Current smoker  Smoking cessation counseling provided  Pt is willing to quit  Other Stroke Risk Factors  Advanced age  Family hx stroke (sister)  Coronary artery disease s/p stenting on DAPT prior to admission  Other Active Problems    Hospital day # 3  This patient is critically ill due to right  large ICH and HTN at significant risk of neurological worsening, death form cerebral edema and brain herniation. This patient's care requires constant monitoring of vital signs, hemodynamics, respiratory and cardiac monitoring, review of multiple databases, neurological assessment, discussion with family, other specialists and medical decision making of high complexity. I spent 40 minutes of neurocritical care time in the care of this patient.  Marvel Plan, MD PhD Stroke Neurology 04/07/2015 5:02 PM   To contact Stroke Continuity provider, please refer to [email or web address]. After hours, contact General Neurology

## 2015-04-07 NOTE — Progress Notes (Signed)
Speech Language Pathology Treatment: Dysphagia  Patient Details Name: Randall Patel MRN: [id number] DOB: [birth year] Today's Date: 04/07/2015 Time: 5825-1898 SLP Time Calculation (min) (ACUTE ONLY): 11 min  Assessment / Plan / Recommendation Clinical Impression  F/u treatment complete with focus on diet tolerance and potential to advance. Unfortunately, patient continues to present with what appears to be a mode-severe oropharyngeal dysphagia with an increase in overt s/s of aspiration across liquid consistencies characterized by both immediate and delayed cough response. Suspect that significant left sided lingual and facial weakness contributing to decreased bolus containment and resulting in a delayed swallow initiation leading to decreased airway protection. Additional decrease in pharyngeal strength possible given severity of deficits. No overt indication of aspiration noted with pureed solids. Recommend diet downgrade with close supervision to monitor for s/s of aspiration and MBS 12/27 to further evaluate function.    HPI HPI: Randall Patel is an 63 y.o. male with a hx of stroke and CAD s/p stenting presents with acute onset of L sided weakness and found to have Large R ICH on CT. PMH: HTN, CAD, ETOH abuse, tobacco abuse.      SLP Plan  Continue with current plan of care     Recommendations  Diet recommendations: Dysphagia 1 (puree);Pudding-thick liquid Liquids provided via: Teaspoon Medication Administration: Crushed with puree Supervision: Staff to assist with self feeding;Full supervision/cueing for compensatory strategies Compensations: Slow rate;Small sips/bites;Lingual sweep for clearance of pocketing (discontinue pos with coughing) Postural Changes and/or Swallow Maneuvers: Seated upright 90 degrees              General recommendations: Rehab consult Oral Care Recommendations: Oral care BID Follow up Recommendations: Inpatient Rehab Plan: Continue with current plan of  care  [facility] MA, CCC-SLP [phone]  [name] 04/07/2015, 3:45 PM

## 2015-04-07 NOTE — Progress Notes (Signed)
*  PRELIMINARY RESULTS* Echocardiogram 2D Echocardiogram has been performed.  Jeryl Columbia 04/07/2015, 3:29 PM

## 2015-04-07 NOTE — Evaluation (Signed)
Physical Therapy Evaluation Patient Details Name: Randall Patel MRN: [id number] DOB: Sep 27, 1950 Today's Date: 04/07/2015   History of Present Illness  pt presents with Large R Frontoparietal ICH.  pt with hx of HTN, MI, CAD, CVA, and Etoh.    Clinical Impression  Pt eager for mobility and follows all cueing well.  Pt with L sided neglect and family ed on encouraging pt to attend to them on his L side.  Feel pt would be great CIR candidate to maximize independence prior to returning to home.  Will continue to follow.      Follow Up Recommendations CIR    Equipment Recommendations  None recommended by PT    Recommendations for Other Services Rehab consult     Precautions / Restrictions Precautions Precautions: Fall Restrictions Weight Bearing Restrictions: No      Mobility  Bed Mobility Overal bed mobility: Needs Assistance Bed Mobility: Rolling;Supine to Sit Rolling: Max assist   Supine to sit: Min assist;+2 for physical assistance;HOB elevated     General bed mobility comments: pt needs increased A with rolling to R side, but participates well with R UE to bring self up to sitting.  pt with posterior lean initially in sitting and with cueing is able to correct.    Transfers Overall transfer level: Needs assistance Equipment used: 2 person hand held assist Transfers: Sit to/from [employer] Sit to Stand: Mod assist;+2 physical assistance Stand pivot transfers: Min assist;+2 physical assistance       General transfer comment: pt leans to L side with coming to standing.  cues for balance and step-by-step through pivot to recliner.  pt able to independently move L LE without A.    Ambulation/Gait                Stairs            Wheelchair Mobility    Modified Guerrant (Stroke Patients Only) Modified Artus (Stroke Patients Only) Pre-Morbid Isenhower Score: No symptoms Modified Jerrell: Severe disability     Balance Overall balance  assessment: Needs assistance Sitting-balance support: Single extremity supported;Feet supported Sitting balance-Leahy Scale: Fair     Standing balance support: No upper extremity supported;During functional activity Standing balance-Leahy Scale: Poor                               Pertinent Vitals/Pain      Home Living Family/patient expects to be discharged to:: Private residence Living Arrangements: Other relatives (sister) Available Help at Discharge: Family;Available PRN/intermittently;Friend(s) (sister is in school, girlfriend has flexible work schedule) Type of Home: House Home Access: Stairs to enter Entrance Stairs-Rails: Acupuncturist of Steps: 4 Home Layout: One level Home Equipment: None Additional Comments: drives    Prior Function Level of Independence: Independent               Hand Dominance   Dominant Hand: Right    Extremity/Trunk Assessment   Upper Extremity Assessment: Defer to OT evaluation           Lower Extremity Assessment: LLE deficits/detail   LLE Deficits / Details: Strength grossly 2/5 with intact sensation.    Cervical / Trunk Assessment: Normal  Communication   Communication: No difficulties  Cognition Arousal/Alertness: Awake/alert Behavior During Therapy: WFL for tasks assessed/performed Overall Cognitive Status: Impaired/Different from baseline Area of Impairment: Attention;Safety/judgement;Awareness;Problem solving   Current Attention Level: Selective     Safety/Judgement: Decreased awareness of safety;Decreased  awareness of deficits Awareness: Emergent Problem Solving: Slow processing;Decreased initiation;Difficulty sequencing;Requires verbal cues;Requires tactile cues General Comments: pt with L sided neglect and when cueing pt to attend to L side, pt states "I can see her."  pt able to state deficits, but poor awareness of implications.      General Comments      Exercises         Assessment/Plan    PT Assessment Patient needs continued PT services  PT Diagnosis Difficulty walking;Hemiplegia non-dominant side   PT Problem List Decreased strength;Decreased activity tolerance;Decreased balance;Decreased mobility;Decreased coordination;Decreased cognition;Decreased knowledge of use of DME;Decreased safety awareness  PT Treatment Interventions DME instruction;Gait training;Stair training;Functional mobility training;Therapeutic activities;Therapeutic exercise;Balance training;Neuromuscular re-education;Cognitive remediation;Patient/family education   PT Goals (Current goals can be found in the Care Plan section) Acute Rehab PT Goals Patient Stated Goal: Walk PT Goal Formulation: With patient Time For Goal Achievement: 04/21/15 Potential to Achieve Goals: Good    Frequency Min 4X/week   Barriers to discharge        Co-evaluation PT/OT/SLP Co-Evaluation/Treatment: Yes Reason for Co-Treatment: Complexity of the patient's impairments (multi-system involvement);For patient/therapist safety PT goals addressed during session: Mobility/safety with mobility;Balance;Strengthening/ROM OT goals addressed during session: ADL's and self-care       End of Session Equipment Utilized During Treatment: Gait belt Activity Tolerance: Patient tolerated treatment well Patient left: in chair;with call bell/phone within reach;with chair alarm set;with family/visitor present Nurse Communication: Mobility status         Time: 1202-1241 PT Time Calculation (min) (ACUTE ONLY): 39 min   Charges:   PT Evaluation $Initial PT Evaluation Tier I: 1 Procedure PT Treatments $Therapeutic Activity: 8-22 mins   PT G CodesSunny Schlein, [place] [phone] 04/07/2015, 1:01 PM

## 2015-04-07 NOTE — Progress Notes (Signed)
PT Cancellation Note  Patient Details Name: [name] MRN: [id number] DOB: [birth year]   Cancelled Treatment:    Reason Eval/Treat Not Completed: Patient not medically ready.  Pt remains on bedrest.  Please advance activity order once appropriate for mobility and PT eval.     Randall Patel F 04/07/2015, 8:21 AM

## 2015-04-08 ENCOUNTER — Inpatient Hospital Stay (HOSPITAL_COMMUNITY): Payer: Medicare Other

## 2015-04-08 DIAGNOSIS — Z720 Tobacco use: Secondary | ICD-10-CM

## 2015-04-08 DIAGNOSIS — I5189 Other ill-defined heart diseases: Secondary | ICD-10-CM | POA: Insufficient documentation

## 2015-04-08 DIAGNOSIS — R1310 Dysphagia, unspecified: Secondary | ICD-10-CM

## 2015-04-08 DIAGNOSIS — I610 Nontraumatic intracerebral hemorrhage in hemisphere, subcortical: Secondary | ICD-10-CM

## 2015-04-08 DIAGNOSIS — D696 Thrombocytopenia, unspecified: Secondary | ICD-10-CM | POA: Insufficient documentation

## 2015-04-08 DIAGNOSIS — I639 Cerebral infarction, unspecified: Secondary | ICD-10-CM

## 2015-04-08 DIAGNOSIS — I25119 Atherosclerotic heart disease of native coronary artery with unspecified angina pectoris: Secondary | ICD-10-CM | POA: Insufficient documentation

## 2015-04-08 DIAGNOSIS — E876 Hypokalemia: Secondary | ICD-10-CM

## 2015-04-08 DIAGNOSIS — E870 Hyperosmolality and hypernatremia: Secondary | ICD-10-CM | POA: Insufficient documentation

## 2015-04-08 DIAGNOSIS — I519 Heart disease, unspecified: Secondary | ICD-10-CM

## 2015-04-08 DIAGNOSIS — IMO0002 Reserved for concepts with insufficient information to code with codable children: Secondary | ICD-10-CM | POA: Insufficient documentation

## 2015-04-08 DIAGNOSIS — I10 Essential (primary) hypertension: Secondary | ICD-10-CM | POA: Insufficient documentation

## 2015-04-08 DIAGNOSIS — I629 Nontraumatic intracranial hemorrhage, unspecified: Secondary | ICD-10-CM | POA: Insufficient documentation

## 2015-04-08 DIAGNOSIS — R0682 Tachypnea, not elsewhere classified: Secondary | ICD-10-CM | POA: Insufficient documentation

## 2015-04-08 DIAGNOSIS — I69922 Dysarthria following unspecified cerebrovascular disease: Secondary | ICD-10-CM

## 2015-04-08 DIAGNOSIS — F1010 Alcohol abuse, uncomplicated: Secondary | ICD-10-CM | POA: Insufficient documentation

## 2015-04-08 DIAGNOSIS — I6930 Unspecified sequelae of cerebral infarction: Secondary | ICD-10-CM

## 2015-04-08 LAB — BASIC METABOLIC PANEL
Anion gap: 9 (ref 5–15)
CO2: 25 mmol/L (ref 22–32)
CREATININE: 0.78 mg/dL (ref 0.61–1.24)
Calcium: 8.9 mg/dL (ref 8.9–10.3)
Chloride: 113 mmol/L — ABNORMAL HIGH (ref 101–111)
GFR calc Af Amer: >60 mL/min (ref >60)
GLUCOSE: 114 mg/dL — AB (ref 65–99)
Potassium: 3.4 mmol/L — ABNORMAL LOW (ref 3.5–5.1)
SODIUM: 147 mmol/L — AB (ref 135–145)

## 2015-04-08 LAB — SODIUM
SODIUM: 148 mmol/L — AB (ref 135–145)
Sodium: 144 mmol/L (ref 135–145)
Sodium: 148 mmol/L — ABNORMAL HIGH (ref 135–145)

## 2015-04-08 LAB — CBC
HCT: 40.7 % (ref 39.0–52.0)
Hemoglobin: 13 g/dL (ref 13.0–17.0)
MCH: 28.1 pg (ref 26.0–34.0)
MCHC: 31.9 g/dL (ref 30.0–36.0)
MCV: 87.9 fL (ref 78.0–100.0)
PLATELETS: 141 10*3/uL — AB (ref 150–400)
RBC: 4.63 MIL/uL (ref 4.22–5.81)
RDW: 15 % (ref 11.5–15.5)
WBC: 8.1 10*3/uL (ref 4.0–10.5)

## 2015-04-08 LAB — HEMOGLOBIN A1C
HEMOGLOBIN A1C: 6.1 % — AB (ref 4.8–5.6)
Mean Plasma Glucose: 128 mg/dL

## 2015-04-08 LAB — HIV ANTIBODY (ROUTINE TESTING W REFLEX): HIV SCREEN 4TH GENERATION: NONREACTIVE

## 2015-04-08 LAB — RPR: RPR: NONREACTIVE

## 2015-04-08 MED ORDER — LOSARTAN POTASSIUM 50 MG PO TABS: 50.0000 mg | ORAL_TABLET | Freq: Every day | ORAL | Status: DC

## 2015-04-08 MED ORDER — LOSARTAN POTASSIUM 50 MG PO TABS
100.0000 mg | ORAL_TABLET | Freq: Every day | ORAL | Status: DC
  Administered 2015-04-08 – 2015-04-10 (×3): 100 mg via ORAL
  Filled 2015-04-08 (×3): qty 2

## 2015-04-08 MED ORDER — ENSURE ENLIVE PO LIQD
237.0000 mL | Freq: Three times a day (TID) | ORAL | Status: DC
  Administered 2015-04-08 – 2015-04-10 (×7): 237 mL via ORAL

## 2015-04-08 MED ORDER — POTASSIUM CHLORIDE CRYS ER 20 MEQ PO TBCR
40.0000 meq | EXTENDED_RELEASE_TABLET | ORAL | Status: AC
  Administered 2015-04-08 (×2): 40 meq via ORAL
  Filled 2015-04-08: qty 2

## 2015-04-08 NOTE — Consult Note (Signed)
Physical Medicine and Rehabilitation Consult Reason for Consult: Right frontoparietal ICH with cerebral edema in setting of hypertension Referring Physician: Dr.Xu   HPI: Randall Patel is a 64 y.o. 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 Patient lives with sister and one level home for steps to entry. Reported to be independent prior to admission and driving, living with sister who attends school during the day. Pt will not have 24/7 support on discharge. Presented 04/04/2015 with headache and blood pressure 189/128. Denies any recent trauma. Cranial CT scan showed a large right frontoparietal intracerebral hemorrhage. Neurosurgery Dr. Marikay Alar consulted and advised conservative care. Placed on Cardene drip for blood pressure control. Aspirin and Plavix discontinued secondary to ICH. Follow-up cranial CT scan 04/07/2015 with evolving right frontal intraparenchymal hematoma right to left midline shift without ventricular entrapment. Echocardiogram with ejection fraction of 45% grade 1 diastolic dysfunction Neurology consulted for follow-up. Later placed on subcutaneous heparin for DVT prophylaxis 04/07/2015. Failed modified barium swallow dysphagia 1 pudding thick liquids. Physical and occupational therapy evaluation completed 04/07/2015 with recommendations of physical medicine rehabilitation consult.  Review of Systems  Constitutional: Negative for fever and chills.  HENT: Negative for hearing loss.   Eyes: Negative for blurred vision and double vision.  Respiratory: Positive for cough. Negative for shortness of breath.   Cardiovascular: Positive for leg swelling. Negative for chest pain.  Gastrointestinal: Positive for constipation. Negative for nausea and vomiting.  Genitourinary: Negative for dysuria and hematuria.  Musculoskeletal: Positive for myalgias and joint pain.    Neurological: Positive for dizziness, focal weakness and headaches. Negative for seizures.       Left side weakness from history of CVA  All other systems reviewed and are negative.  Past Medical History  Diagnosis Date  . Hypertension   . Coronary artery disease     MI in 2008 with PCI to ? vessel; NSTEMI 03/2012; NSTEMI 04/2012 Cath showed triple vessel dz including occluded RCA, s/p DES to distal RCA, PTCA posterolateral branch   . CVA (cerebral infarction)     03/2012 with left arm weakness/numbness and left facial droop.  . Ischemic cardiomyopathy     04/2012 EF 35-40%  . Hyperlipidemia   . Claudication (HCC)   . Tobacco abuse     quit 04/06/12  . ETOH abuse     quit 04/06/12   Past Surgical History  Procedure Laterality Date  . Hernia repair    . Left heart catheterization with coronary angiogram N/A 04/11/2012    Procedure: LEFT HEART CATHETERIZATION WITH CORONARY ANGIOGRAM;  Surgeon: Kathleene Hazel, MD;  Location: [facility] CATH LAB;  Service: Cardiovascular;  Laterality: N/A;  . Percutaneous coronary stent intervention (pci-s)  04/11/2012    Procedure: PERCUTANEOUS CORONARY STENT INTERVENTION (PCI-S);  Surgeon: Kathleene Hazel, MD;  Location: [facility] CATH LAB;  Service: Cardiovascular;;  Distal RCA  . Percutaneous coronary intervention-balloon only  04/11/2012    Procedure: PERCUTANEOUS CORONARY INTERVENTION-BALLOON ONLY;  Surgeon: Kathleene Hazel, MD;  Location: [facility] CATH LAB;  Service: Cardiovascular;;  RPLA   Family History  Problem Relation Age of Onset  . Cancer Mother   . Heart attack Father   . Hypertension Mother   . Hypertension Father   . Hypertension Sister   . Hypertension Brother   . Stroke Sister    Social History:  reports that he has been smoking Cigarettes.  He has  a 40 pack-year smoking history. He has never used smokeless tobacco. He reports that he drinks alcohol. He reports that he does not use illicit drugs. Allergies:  Allergies   Allergen Reactions  . Lisinopril Swelling and Other (See Comments)    angioedema   Medications Prior to Admission  Medication Sig Dispense Refill  . aspirin 81 MG chewable tablet Chew 1 tablet (81 mg total) by mouth daily.    [name] atorvastatin (LIPITOR) 80 MG tablet Take 1 tablet (80 mg total) by mouth daily with breakfast. 90 tablet 3  . carvedilol (COREG) 6.25 MG tablet Take 1 tablet (6.25 mg total) by mouth 2 (two) times daily with a meal. (Patient taking differently: Take 6.25 mg by mouth daily. ) 180 tablet 3  . clopidogrel (PLAVIX) 75 MG tablet Take 1 tablet (75 mg total) by mouth daily. 90 tablet 3  . furosemide (LASIX) 20 MG tablet Take 1 tablet (20 mg total) by mouth daily. 90 tablet 3  . losartan (COZAAR) 25 MG tablet Take 1 tablet (25 mg total) by mouth daily. 90 tablet 3  . meloxicam (MOBIC) 7.5 MG tablet Take 15 mg by mouth daily.     . nitroGLYCERIN (NITROSTAT) 0.4 MG SL tablet Place 1 tablet (0.4 mg total) under the tongue every 5 (five) minutes as needed for chest pain (up to 3 doses). 25 tablet 3    Home: Home Living Family/patient expects to be discharged to:: Private residence Living Arrangements: Other relatives (sister) Available Help at Discharge: Family, Available PRN/intermittently, Friend(s) Type of Home: House Home Access: Stairs to enter Secretary/administrator of Steps: 4 Entrance Stairs-Rails: Left Home Layout: One level Bathroom Shower/Tub: Engineer, manufacturing systems: Standard Home Equipment: None Additional Comments: drives  Functional History: Prior Function Level of Independence: Independent Comments: drives Functional Status:  Mobility: Bed Mobility Overal bed mobility: Needs Assistance Bed Mobility: Rolling, Supine to Sit Rolling: Max assist Supine to sit: Min assist, +2 for physical assistance, HOB elevated General bed mobility comments: pt needs increased A with rolling to R side, but participates well with R UE to bring self up to  sitting.  pt with posterior lean initially in sitting and with cueing is able to correct.   Transfers Overall transfer level: Needs assistance Equipment used: 2 person hand held assist Transfers: Sit to/from Stand, Stand Pivot Transfers Sit to Stand: Mod assist, +2 physical assistance Stand pivot transfers: Min assist, +2 physical assistance General transfer comment: pt leans to L side with coming to standing.  cues for balance and step-by-step through pivot to recliner.  pt able to independently move L LE without A.        ADL: ADL Overall ADL's : Needs assistance/impaired Eating/Feeding: Minimal assistance, Sitting Grooming: Wash/dry face, Sitting, Minimal assistance Upper Body Bathing: Sitting, Maximal assistance Lower Body Bathing: Sit to/from stand, Maximal assistance, +2 for physical assistance Upper Body Dressing : Maximal assistance, Sitting Lower Body Dressing: +2 for physical assistance, Minimal assistance, Sit to/from stand Toilet Transfer: +2 for physical assistance, Minimal assistance, Stand-pivot, BSC Toileting- Clothing Manipulation and Hygiene: +2 for safety/equipment, Maximal assistance, Sit to/from stand  Cognition: Cognition Overall Cognitive Status: Impaired/Different from baseline Arousal/Alertness: Awake/alert Orientation Level: Oriented X4 Attention: Sustained Sustained Attention: Impaired Sustained Attention Impairment: Functional basic Memory: Appears intact Awareness: Impaired Awareness Impairment: Intellectual impairment Problem Solving: Impaired Problem Solving Impairment: Verbal complex, Functional complex Executive Function: Reasoning Reasoning: Impaired Reasoning Impairment: Verbal complex, Functional complex Behaviors:  (flat affect) Cognition Arousal/Alertness: Awake/alert Behavior During Therapy: Flat  affect Overall Cognitive Status: Impaired/Different from baseline Area of Impairment: Attention, Safety/judgement, Awareness, Problem  solving Current Attention Level: Selective Safety/Judgement: Decreased awareness of safety, Decreased awareness of deficits Awareness: Emergent Problem Solving: Slow processing, Decreased initiation, Difficulty sequencing, Requires verbal cues, Requires tactile cues General Comments: pt with L sided neglect and when cueing pt to attend to L side, pt states "I can see her."  pt able to state deficits, but poor awareness of implications.    Blood pressure 164/101, pulse 95, temperature 99.5 F (37.5 C), temperature source Oral, resp. rate 26, height  (1.651 m), weight 71.1 kg (156 lb 12 oz), SpO2 99 %. Physical Exam  Vitals reviewed. Constitutional: He is oriented to person, place, and time. He appears well-developed and well-nourished.  HENT:  Head: Normocephalic and atraumatic.  Eyes: Conjunctivae and EOM are normal.  Pupils sluggish to light Injected sclera  Neck: Normal range of motion. Neck supple. No thyromegaly present.  Cardiovascular: Normal rate and regular rhythm.   Respiratory: Effort normal and breath sounds normal. No respiratory distress.  GI: Soft. Bowel sounds are normal. He exhibits no distension.  Musculoskeletal: He exhibits no edema or tenderness.  Neurological: He is alert and oriented to person, place, and time. He has normal reflexes.  Right gaze preference.  Speech is mildly dysarthric but intelligible.  Follows simple commands.  Fair awareness of deficits. Left facial weakness Sensation intact to light touch Motor: B/l UE, RLE: 5/5 proximal to distal LUE: 0/5  Skin: Skin is warm and dry.  Psychiatric: He has a normal mood and affect. His behavior is normal.    Results for orders placed or performed during the hospital encounter of 04/04/15 (from the past 24 hour(s))  Sodium     Status: None   Collection Time: 04/07/15  5:59 AM  Result Value Ref Range   Sodium 143 135 - 145 mmol/L  Osmolality     Status: None   Collection Time: 04/07/15  8:05 AM    Result Value Ref Range   Osmolality 293 275 - 295 mOsm/kg  CBC     Status: None   Collection Time: 04/07/15  8:05 AM  Result Value Ref Range   WBC 7.9 4.0 - 10.5 K/uL   RBC 5.00 4.22 - 5.81 MIL/uL   Hemoglobin 14.0 13.0 - 17.0 g/dL   HCT 16.1 09.6 - 04.5 %   MCV 89.0 78.0 - 100.0 fL   MCH 28.0 26.0 - 34.0 pg   MCHC 31.5 30.0 - 36.0 g/dL   RDW 40.9 81.1 - 91.4 %   Platelets 156 150 - 400 K/uL  Basic metabolic panel     Status: Abnormal   Collection Time: 04/07/15  8:05 AM  Result Value Ref Range   Sodium 143 135 - 145 mmol/L   Potassium 3.4 (L) 3.5 - 5.1 mmol/L   Chloride 109 101 - 111 mmol/L   CO2 22 22 - 32 mmol/L   Glucose, Bld 117 (H) 65 - 99 mg/dL   BUN <5 (L) 6 - 20 mg/dL   Creatinine, Ser 7.82 0.61 - 1.24 mg/dL   Calcium 9.7 8.9 - 95.6 mg/dL   GFR calc non Af Amer >60 >60 mL/min   GFR calc Af Amer >60 >60 mL/min   Anion gap 12 5 - 15  HIV antibody     Status: None   Collection Time: 04/07/15  8:05 AM  Result Value Ref Range   HIV Screen 4th Generation wRfx Non Reactive Non Reactive  RPR     Status: None   Collection Time: 04/07/15  8:05 AM  Result Value Ref Range   RPR Ser Ql Non Reactive Non Reactive  Vitamin B12     Status: None   Collection Time: 04/07/15  8:05 AM  Result Value Ref Range   Vitamin B-12 491 180 - 914 pg/mL  Sodium     Status: None   Collection Time: 04/07/15 11:25 AM  Result Value Ref Range   Sodium 143 135 - 145 mmol/L  Sodium     Status: None   Collection Time: 04/07/15  7:00 PM  Result Value Ref Range   Sodium 143 135 - 145 mmol/L  Sodium     Status: None   Collection Time: 04/08/15 12:00 AM  Result Value Ref Range   Sodium 144 135 - 145 mmol/L  CBC     Status: Abnormal   Collection Time: 04/08/15  5:35 AM  Result Value Ref Range   WBC 8.1 4.0 - 10.5 K/uL   RBC 4.63 4.22 - 5.81 MIL/uL   Hemoglobin 13.0 13.0 - 17.0 g/dL   HCT 08.8 11.0 - 31.5 %   MCV 87.9 78.0 - 100.0 fL   MCH 28.1 26.0 - 34.0 pg   MCHC 31.9 30.0 - 36.0 g/dL    RDW 94.5 85.9 - 29.2 %   Platelets 141 (L) 150 - 400 K/uL   Ct Head Wo Contrast  04/07/2015  CLINICAL DATA:  Follow-up, altered mental status. History of hypertension, alcohol abuse, hemiplegia. EXAM: CT HEAD WITHOUT CONTRAST TECHNIQUE: Contiguous axial images were obtained from the base of the skull through the vertex without intravenous contrast. COMPARISON:  CT head April 05, 2015 FINDINGS: Evolving RIGHT frontal 5.7 cm x 4.4 cm intraparenchymal hematoma with surrounding low-density vasogenic edema. Adjacent encephalomalacia. 3 mm RIGHT to LEFT midline shift is unchanged. Mild RIGHT uncal herniation. Partial effacement RIGHT lateral ventricle without entrapment or hydrocephalus. No acute large vascular territory infarct. Basal cisterns are patent. Old LEFT medial orbital blowout fracture. Imaged paranasal sinuses and mastoid air cells are well aerated. Ocular globes and orbital contents are unremarkable. No skull fracture. IMPRESSION: Evolving large RIGHT frontal intraparenchymal hematoma. Similar 3 mm RIGHT to LEFT midline shift without ventricular entrapment. Electronically Signed   By: Awilda Metro M.D.   On: 04/07/2015 04:58   Dg Chest Port 1 View  04/07/2015  CLINICAL DATA:  Acute right intracranial hemorrhage, central line placement EXAM: PORTABLE CHEST 1 VIEW COMPARISON:  04/11/2012 FINDINGS: Right subclavian central line tip lower SVC level. Mild cardiomegaly with central vascular congestion. No focal pneumonia, collapse or consolidation. No edema, effusion or pneumothorax. Trachea midline. IMPRESSION: Right subclavian central line tip lower SVC level Cardiomegaly without CHF or pneumonia No pneumothorax or effusion Electronically Signed   By: Judie Petit.  Shick M.D.   On: 04/07/2015 11:35    Assessment/Plan: Diagnosis: Right frontoparietal ICH Labs and images independently reviewed.  Records reviewed and summated above. Stroke: Continue secondary stroke prophylaxis and Risk Factor  Modification listed below: Blood Pressure Management:  Continue current medication with prn's with permisive HTN per primary team Tobacco abuse:  Cont to counsel Left sided hemiparesis: fit for orthotics to prevent contractures (resting hand splint for day, wrist cock up splint at night, etc) Motor recovery: Fluoxetine  1. Does the need for close, 24 hr/day medical supervision in concert with the patient's rehab needs make it unreasonable for this patient to be served in a less intensive setting? Yes 2. 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 Due to safety, disease management, medication administration and patient education, does the patient require 24 hr/day rehab nursing? Yes 4. Does the patient require coordinated care of a physician, rehab nurse, PT (1-2 hrs/day, 5 days/week), OT (1-2 hrs/day, 5 days/week) and SLP (1-2 hrs/day, 5 days/week) to address physical and functional deficits in the context of the above medical diagnosis(es)? Yes Addressing deficits in the following areas: balance, endurance, locomotion, strength, transferring, bowel/bladder control, bathing, dressing, feeding, grooming, toileting, speech, swallowing and psychosocial support 5. Can the patient actively participate in an intensive therapy program of at least 3 hrs of therapy per day at least 5 days per week? Yes 6. The potential for patient to make measurable gains while on inpatient rehab is  excellent 7. Anticipated functional outcomes upon discharge from inpatient rehab are supervision and min assist  with PT, supervision and min assist with OT, supervision and min assist with SLP. 8. Estimated rehab length of stay to reach the above functional goals is: 20-24 days. 9. Does the patient have adequate social supports and living environment to accommodate these discharge functional goals? No 10. Anticipated D/C setting: Other 11. Anticipated post D/C treatments: Other 12. Overall Rehab/Functional Prognosis: good  RECOMMENDATIONS: This patient's condition is appropriate for continued rehabilitative care in the following setting: Pt does not have 24/7 support on discharge and will not be able to achive an independent level of functioning after a short IRF stay.  If family is able to provide support, would recommend CIR, otherwise SNF. Patient has agreed to participate in recommended program. Yes Note that insurance prior authorization may be required for reimbursement for recommended care.  Comment: Rehab Admissions Coordinator to follow up.  Maryla Morrow, MD 04/08/2015

## 2015-04-08 NOTE — Care Management Note (Signed)
Case Management Note  Patient Details  Name: Randall Patel MRN: [id number] Date of Birth: 04/23/1950  Subjective/Objective:    Pt admitted on 04/04/15 with hemorrhagic stroke.  PTA, pt independent, lives with sister.                  Action/Plan: PT/OT recommending CIR.  MD, please order consult if you agree.  Will follow progress.    Expected Discharge Date:                  Expected Discharge Plan:  IP Rehab Facility  In-House Referral:     Discharge planning Services  CM Consult  Post Acute Care Choice:    Choice offered to:     DME Arranged:    DME Agency:     HH Arranged:    HH Agency:     Status of Service:     Medicare Important Message Given:  Yes Date Medicare IM Given:    Medicare IM give by:    Date Additional Medicare IM Given:    Additional Medicare Important Message give by:     If discussed at Long Length of Stay Meetings, dates discussed:    Additional Comments:  Quintella Baton, RN, BSN  Trauma/Neuro ICU Case Manager [phone]

## 2015-04-08 NOTE — Progress Notes (Signed)
STROKE TEAM PROGRESS NOTE   SUBJECTIVE (INTERVAL HISTORY) Patient still reports he does not have an appetite. Continue to have poor po intake. Speech on board and put on pudding diet yesterday, will go for MBS today. BP stable < 160 but still on cardene. On 3% saline now and Na 147 this am.    OBJECTIVE Temp:  [98.3 F (36.8 C)-99.5 F (37.5 C)] 98.4 F (36.9 C) (12/27 0721) Pulse Rate:  [46-115] 86 (12/27 0845) Cardiac Rhythm:  [-] Normal sinus rhythm (12/27 0800) Resp:  [14-31] 25 (12/27 0830) BP: (127-182)/(81-148) 148/88 mmHg (12/27 0845) SpO2:  [93 %-100 %] 98 % (12/27 0845)  CBC:   Recent Labs Lab 04/04/15 1818  04/07/15 0805 04/08/15 0535  WBC 6.4  --  7.9 8.1  NEUTROABS 5.3  --   --   --   HGB 14.4  < > 14.0 13.0  HCT 45.0  < > 44.5 40.7  MCV 86.5  --  89.0 87.9  PLT 164  --  156 141*  < > = values in this interval not displayed.  Basic Metabolic Panel:  Recent Labs Lab 04/07/15 0805  04/08/15 04/08/15 0535  NA 143  < > 144 147*  K 3.4*  --   --  3.4*  CL 109  --   --  113*  CO2 22  --   --  25  GLUCOSE 117*  --   --  114*  BUN <5*  --   --  <5*  CREATININE 0.80  --   --  0.78  CALCIUM 9.7  --   --  8.9  < > = values in this interval not displayed.  Lipid Panel:     Component Value Date/Time   CHOL 179 04/07/2015 0203   TRIG 67 04/07/2015 0203   HDL 96 04/07/2015 0203   CHOLHDL 1.9 04/07/2015 0203   VLDL 13 04/07/2015 0203   LDLCALC 70 04/07/2015 0203   HgbA1c:  Lab Results  Component Value Date   HGBA1C 6.1* 04/07/2015   Urine Drug Screen:     Component Value Date/Time   LABOPIA NONE DETECTED 04/04/2015 1755   COCAINSCRNUR NONE DETECTED 04/04/2015 1755   LABBENZ NONE DETECTED 04/04/2015 1755   AMPHETMU NONE DETECTED 04/04/2015 1755   THCU POSITIVE* 04/04/2015 1755   LABBARB NONE DETECTED 04/04/2015 1755      IMAGING I have personally reviewed the radiological images below and agree with the radiology interpretations. Blue text is my  interpretation.  CT head   04/07/2015 Evolving large RIGHT frontal intraparenchymal hematoma. Similar 3 mm RIGHT to LEFT midline shift without ventricular entrapment. However, by my reading, pt midline shift has increased to 6mm, mass effect more prominent at superior frontal region.   04/05/2015: 1. Large RIGHT frontal intracranial hemorrhage is slightly contracted in volume. 2. Decrease in severity of mile leftward midline shift. 3. No new intracranial hemorrhage. 4. No extension to the ventricular system. 5. Basal cisterns are patent.  04/04/2015   Large acute right frontoparietal intracranial hemorrhage, with evidence of active extravasation and 8 mm leftward midline shift. No evidence of downward herniation.   2D echo -  - Left ventricle: The cavity size was normal. Wall thickness was increased in a pattern of moderate LVH. Systolic function was mildly to moderately reduced. The estimated ejection fraction was in the range of 40% to 45%. There is akinesis of the mid-apicalinferolateral myocardium. Doppler parameters are consistent with abnormal left ventricular relaxation (grade 1 diastolic dysfunction). -  Mitral valve: There was mild regurgitation. Impressions: - No cardiac source of emboli was indentified. Compared to the prior study, there has been no significant interval change.  CUS - pending   PHYSICAL EXAM Filed Vitals:   04/08/15 0830 04/08/15 0845  BP: 170/146 148/88  Pulse: 70 86  Temp:    Resp: 25    HEENT- Normocephalic, no lesions, without obvious abnormality. Normal external eye and conjunctiva.   Cardiovascular- regular rate and rhythm, S1, S2 normal, no murmur, click, rub or gallop, pulses palpable throughout   Lungs- chest clear, no wheezing, rales, normal symmetric air entry  Extremities- no edema  Neurological Examination Mental Status: Alert, oriented to name, date, day, month, year, thought content  appropriate.dysarthria.Naming intact. No aphasia.   Cranial Nerves: II: Visual fields grossly normal, pupils equal, round, reactive to light, R gaze preference, but able to cross midline III,IV, VI: ptosis not present, extra-ocular movements intact bilaterally V,VII: L facial droop, intact sensory bilat VIII: hearing normal to voice bilaterally IX,X: uvula rises symmetrically XI: bilateral shoulder shrug XII: midline tongue extension Motor: Right :Upper 5/5Left:Upper0/5 Right Lower5/5Left Lower extremity 4/5 Tone: Decreased tone left arm Sensory: symmetrical b/l DTR: 1+ throughout with left babinski positive Coordination: No dysmetria on the right. Cannot test left due to weakness Gait not tested due to weakness.   ASSESSMENT/PLAN [name] is a 64 y.o. male with history of hypertension, coronary artery disease with previous MI, previous CVA, ischemic cardiomyopathy, hyperlipidemia, PVD, tobacco use, and alcohol abuse presenting with left hemiparesis. He did not receive IV t-PA due to intercranial hemorrhage.  Stroke:  Large acute right frontoparietal ICH with cerebral edema in setting of hypertension.   Resultant  Left hemiparesis, dysarthria, dysphagia  MRI with contrast and MRA head once blood absorbed  Repeat CT 04/07/15 increased midline shift and mass effect.  Carotid Doppler  Pending   2D Echo EF 35-40%. No cardiac source of emboli identified.  LDL - 86  HgbA1c 6.4  VTE prophylaxis - heparin subq   aspirin 81 mg daily and clopidogrel 75 mg daily prior to admission, now on No antithrombotic secondary to hemorrhage.  Ongoing aggressive stroke risk factor management  Therapy recommendations: Pending.   Disposition: Pending  Cerebral edema  Placed central line.  Once in place, will discontinue mannitol and start 3% at 50/hr.  Na Q6h, 141->147  Goal Na 150-155  Hx stroke/TIA, encephalomalacia  03/2012 R cortical motor strip  infarcts  R frontal encephalomalacia  Concerning for embolic infarcts  Consider outpt 30 day cardiac event monitoring to look for embolic source  Dysphagia  Secondary to hemorrhage  DIET - DYS 1 Room service appropriate?: Yes; Fluid consistency:: Pudding Thick   ST following  Add ensure pudding (kozyshack)  Increase IVF NS to 30cc/h due to poor po intake  Hypertensive Urgency  Stable this am  On cardene drip  Increase SBP goal to < 160  Wean cardene as able  Resumed home po medications - coreg and losartan.  Increase losartan to 100mg  daily  Hyperlipidemia  Home meds:  Lipitor 80 mg daily prior to admission - held secondary to ICH.  LDL 86  Continue statin at discharge  Tobacco abuse  Current smoker  Smoking cessation counseling provided  Pt is willing to quit  Other Stroke Risk Factors  Advanced age  Family hx stroke (sister)  Coronary artery disease s/p stenting on DAPT prior to admission  Other Active Problems    Hospital day # 4  This  patient is critically ill due to right large ICH and HTN at significant risk of neurological worsening, death form cerebral edema and brain herniation. This patient's care requires constant monitoring of vital signs, hemodynamics, respiratory and cardiac monitoring, review of multiple databases, neurological assessment, discussion with family, other specialists and medical decision making of high complexity. I spent 35 minutes of neurocritical care time in the care of this patient.  Marvel Plan, MD PhD Stroke Neurology 04/08/2015 9:10 AM   To contact Stroke Continuity provider, please refer to [email or web address]. After hours, contact General Neurology

## 2015-04-08 NOTE — Progress Notes (Signed)
Physical Therapy Treatment Patient Details Name: Randall Patel MRN: [id number] DOB: 10/14/1950 Today's Date: 04/08/2015    History of Present Illness pt presents with Large R Frontoparietal ICH.  pt with hx of HTN, MI, CAD, CVA, and Etoh.      PT Comments    Pt able to improve mobility and ambulate short distance today.  Pt needs cues for attending to L side and sequencing during mobility, but anticipate will make great progress.  Will continue to follow.    Follow Up Recommendations  CIR     Equipment Recommendations  None recommended by PT    Recommendations for Other Services       Precautions / Restrictions Precautions Precautions: Fall Restrictions Weight Bearing Restrictions: No    Mobility  Bed Mobility Overal bed mobility: Needs Assistance Bed Mobility: Supine to Sit     Supine to sit: Mod assist;HOB elevated     General bed mobility comments: pt needs A with trunk, L LE and scooting to EOB today.    Transfers Overall transfer level: Needs assistance Equipment used: 2 person hand held assist Transfers: Sit to/from Stand Sit to Stand: Mod assist;+2 physical assistance         General transfer comment: pt continues with L lateral lean, but with cueing is able to attempt to correct lean, but unable to maintain balance over BOS.    Ambulation/Gait Ambulation/Gait assistance: Mod assist;+2 physical assistance Ambulation Distance (Feet): 5 Feet Assistive device: 2 person hand held assist Gait Pattern/deviations: Step-to pattern;Decreased step length - left;Decreased stance time - left;Decreased dorsiflexion - left     General Gait Details: pt leans to L side and L knee flexes at times during stance, but does not buckle.  cues for sequencing and weight shifting.   Stairs            Wheelchair Mobility    Modified [place] (Stroke Patients Only) Modified [place] (Stroke Patients Only) Pre-Morbid Schrag Score: No symptoms Modified [place]:  Moderately severe disability     Balance Overall balance assessment: Needs assistance Sitting-balance support: No upper extremity supported;Feet supported Sitting balance-Leahy Scale: Fair   Postural control: Left lateral lean Standing balance support: During functional activity Standing balance-Leahy Scale: Poor                      Cognition Arousal/Alertness: Awake/alert Behavior During Therapy: Flat affect Overall Cognitive Status: Impaired/Different from baseline Area of Impairment: Attention;Safety/judgement;Awareness;Problem solving   Current Attention Level: Selective     Safety/Judgement: Decreased awareness of safety;Decreased awareness of deficits Awareness: Emergent Problem Solving: Slow processing;Decreased initiation;Difficulty sequencing;Requires verbal cues;Requires tactile cues General Comments: pt continues with L sided neglect and needing cues for attending to L side.    Exercises      General Comments        Pertinent Vitals/Pain Pain Assessment: Faces Faces Pain Scale: Hurts a little bit Pain Location: L UE from laying on it at home per pt. Pain Descriptors / Indicators: Grimacing Pain Intervention(s): Limited activity within patient's tolerance;Monitored during session;Premedicated before session;Repositioned    Home Living                      Prior Function            PT Goals (current goals can now be found in the care plan section) Acute Rehab PT Goals Patient Stated Goal: Walk PT Goal Formulation: With patient Time For Goal Achievement: 04/21/15 Potential to Achieve Goals: Good  Progress towards PT goals: Progressing toward goals    Frequency  Min 4X/week    PT Plan Current plan remains appropriate    Co-evaluation             End of Session Equipment Utilized During Treatment: Gait belt Activity Tolerance: Patient tolerated treatment well Patient left: in chair;with call bell/phone within reach;with  chair alarm set     Time: 0837-0900 PT Time Calculation (min) (ACUTE ONLY): 23 min  Charges:  $Gait Training: 8-22 mins $Therapeutic Activity: 8-22 mins                    G CodesSunny Schlein, Randall Patel [phone] 04/08/2015, 10:04 AM

## 2015-04-08 NOTE — Procedures (Signed)
Objective Swallowing Evaluation: MBS-Modified Barium Swallow Study  Patient Details  Name: Randall Patel MRN: [id number] Date of Birth: [birth year]  Today's Date: 04/08/2015 Time: SLP Start Time (ACUTE ONLY): 1010-SLP Stop Time (ACUTE ONLY): 1040 SLP Time Calculation (min) (ACUTE ONLY): 30 min  Past Medical History:  Past Medical History  Diagnosis Date  . Hypertension   . Coronary artery disease     MI in 2008 with PCI to ? vessel; NSTEMI 03/2012; NSTEMI 04/2012 Cath showed triple vessel dz including occluded RCA, s/p DES to distal RCA, PTCA posterolateral branch   . CVA (cerebral infarction)     03/2012 with left arm weakness/numbness and left facial droop.  . Ischemic cardiomyopathy     04/2012 EF 35-40%  . Hyperlipidemia   . Claudication (HCC)   . Tobacco abuse     quit 04/06/12  . ETOH abuse     quit 04/06/12   Past Surgical History:  Past Surgical History  Procedure Laterality Date  . Hernia repair    . Left heart catheterization with coronary angiogram N/A 04/11/2012    Procedure: LEFT HEART CATHETERIZATION WITH CORONARY ANGIOGRAM;  Surgeon: Kathleene Hazel, MD;  Location: [facility] CATH LAB;  Service: Cardiovascular;  Laterality: N/A;  . Percutaneous coronary stent intervention (pci-s)  04/11/2012    Procedure: PERCUTANEOUS CORONARY STENT INTERVENTION (PCI-S);  Surgeon: Kathleene Hazel, MD;  Location: [facility] CATH LAB;  Service: Cardiovascular;;  Distal RCA  . Percutaneous coronary intervention-balloon only  04/11/2012    Procedure: PERCUTANEOUS CORONARY INTERVENTION-BALLOON ONLY;  Surgeon: Kathleene Hazel, MD;  Location: [facility] CATH LAB;  Service: Cardiovascular;;  RPLA   HPI: Nohe Kuczmarski is an 64 y.o. male with a hx of stroke and CAD s/p stenting presents with acute onset of L sided weakness and found to have Large R ICH on CT. PMH: HTN, CAD, ETOH abuse, tobacco abuse.  No Data Recorded  Assessment / Plan / Recommendation  CHL IP CLINICAL IMPRESSIONS  04/08/2015  Therapy Diagnosis Moderate oral phase dysphagia;Moderate pharyngeal phase dysphagia;Mild cervical esophageal phase dysphagia  Clinical Impression Pt presents with a moderate dysphagia marked by: 1) reduced sensorimotor function with impaired oral control, premature spillage into pharynx and residue throughout left lateral sulcus; 2) reduced mobility hyolaryngeal complex leading to left-sided pharyngeal residue before and after the swallow; 3) sensed, trace aspiration of thin liquids; no penetration nor aspiration of nectar or honey-thick liquids.  Postural adjustments not effective.  Occasional POs noted to backflow from cervical esophagus into pyriforms.  Pt may resume a dysphagia 1 diet with nectar-thick liquids; meds crushed in puree.  Needs full supervision and cues to maintain safety given poor sensory awareness and impaired judgment.   Impact on safety and function Moderate aspiration risk      CHL IP TREATMENT RECOMMENDATION 04/08/2015  Treatment Recommendations Therapy as outlined in treatment plan below     Prognosis 04/08/2015  Prognosis for Safe Diet Advancement Good  Barriers to Reach Goals Cognitive deficits  Barriers/Prognosis Comment --    CHL IP DIET RECOMMENDATION 04/08/2015  SLP Diet Recommendations Dysphagia 1 (Puree) solids;Nectar thick liquid  Liquid Administration via Cup  Medication Administration Crushed with puree  Compensations Slow rate;Small sips/bites;Lingual sweep for clearance of pocketing  Postural Changes Remain semi-upright after after feeds/meals (Comment)      CHL IP OTHER RECOMMENDATIONS 04/08/2015  Recommended Consults --  Oral Care Recommendations Oral care BID  Other Recommendations --      CHL IP FOLLOW UP RECOMMENDATIONS 04/08/2015  Follow up  Recommendations Inpatient Rehab      CHL IP FREQUENCY AND DURATION 04/08/2015  Speech Therapy Frequency (ACUTE ONLY) min 2x/week  Treatment Duration 2 weeks           CHL IP ORAL PHASE  04/08/2015  Oral Phase Impaired  Oral - Pudding Teaspoon --  Oral - Pudding Cup --  Oral - Honey Teaspoon --  Oral - Honey Cup Left anterior bolus loss;Weak lingual manipulation;Left pocketing in lateral sulci;Delayed oral transit;Decreased bolus cohesion;Premature spillage  Oral - Nectar Teaspoon --  Oral - Nectar Cup Left anterior bolus loss;Weak lingual manipulation;Left pocketing in lateral sulci;Delayed oral transit;Decreased bolus cohesion;Premature spillage  Oral - Nectar Straw --  Oral - Thin Teaspoon --  Oral - Thin Cup Left anterior bolus loss;Weak lingual manipulation;Left pocketing in lateral sulci;Delayed oral transit;Decreased bolus cohesion;Premature spillage  Oral - Thin Straw --  Oral - Puree Left anterior bolus loss;Weak lingual manipulation;Left pocketing in lateral sulci;Delayed oral transit;Decreased bolus cohesion  Oral - Mech Soft --  Oral - Regular --  Oral - Multi-Consistency --  Oral - Pill --  Oral Phase - Comment --    CHL IP PHARYNGEAL PHASE 04/08/2015  Pharyngeal Phase Impaired  Pharyngeal- Pudding Teaspoon --  Pharyngeal --  Pharyngeal- Pudding Cup --  Pharyngeal --  Pharyngeal- Honey Teaspoon --  Pharyngeal --  Pharyngeal- Honey Cup Delayed swallow initiation-vallecula;Reduced anterior laryngeal mobility;Reduced laryngeal elevation;Reduced tongue base retraction;Pharyngeal residue - valleculae;Pharyngeal residue - pyriform  Pharyngeal --  Pharyngeal- Nectar Teaspoon --  Pharyngeal --  Pharyngeal- Nectar Cup Reduced anterior laryngeal mobility;Reduced laryngeal elevation;Reduced tongue base retraction;Pharyngeal residue - valleculae;Pharyngeal residue - pyriform;Delayed swallow initiation-pyriform sinuses  Pharyngeal --  Pharyngeal- Nectar Straw --  Pharyngeal --  Pharyngeal- Thin Teaspoon --  Pharyngeal --  Pharyngeal- Thin Cup Reduced anterior laryngeal mobility;Reduced laryngeal elevation;Reduced tongue base retraction;Pharyngeal residue -  valleculae;Pharyngeal residue - pyriform;Delayed swallow initiation-pyriform sinuses;Reduced airway/laryngeal closure;Penetration/Aspiration during swallow;Trace aspiration  Pharyngeal Material enters airway, passes BELOW cords and not ejected out despite cough attempt by patient  Pharyngeal- Thin Straw --  Pharyngeal --  Pharyngeal- Puree Delayed swallow initiation-vallecula;Reduced anterior laryngeal mobility;Reduced laryngeal elevation;Reduced tongue base retraction;Pharyngeal residue - valleculae;Pharyngeal residue - pyriform  Pharyngeal --  Pharyngeal- Mechanical Soft --  Pharyngeal --  Pharyngeal- Regular --  Pharyngeal --  Pharyngeal- Multi-consistency --  Pharyngeal --  Pharyngeal- Pill --  Pharyngeal --  Pharyngeal Comment --     CHL IP CERVICAL ESOPHAGEAL PHASE 04/08/2015  Cervical Esophageal Phase Impaired  Pudding Teaspoon --  Pudding Cup --  Honey Teaspoon --  Honey Cup --  Nectar Teaspoon --  Nectar Cup --  Nectar Straw --  Thin Teaspoon --  Thin Cup --  Thin Straw --  Puree --  Mechanical Soft --  Regular --  Multi-consistency --  Pill --  Cervical Esophageal Comment prominent CP with occasional barium backflowing from cervical esophagus into pyriforms     Blenda Mounts Laurice 04/08/2015, 10:53 AM

## 2015-04-08 NOTE — Care Management Important Message (Signed)
Important Message  Patient Details  Name: Randall Patel MRN: [id number] Date of Birth: Jan 08, 1951   Medicare Important Message Given:  Yes    Kyla Balzarine 04/08/2015, 12:02 PM

## 2015-04-08 NOTE — Progress Notes (Signed)
*  PRELIMINARY RESULTS* Vascular Ultrasound Carotid Duplex (Doppler) has been completed.  Findings suggest 1-39% internal carotid artery stenosis bilaterally. Vertebral arteries are patent with antegrade flow.  04/08/2015 2:47 PM Gertie Fey, RVT, RDCS, RDMS

## 2015-04-09 ENCOUNTER — Inpatient Hospital Stay (HOSPITAL_COMMUNITY): Payer: Medicare Other

## 2015-04-09 DIAGNOSIS — I629 Nontraumatic intracranial hemorrhage, unspecified: Secondary | ICD-10-CM

## 2015-04-09 LAB — BASIC METABOLIC PANEL
ANION GAP: 7 (ref 5–15)
BUN: 5 mg/dL — ABNORMAL LOW (ref 6–20)
CHLORIDE: 118 mmol/L — AB (ref 101–111)
CO2: 23 mmol/L (ref 22–32)
Calcium: 9 mg/dL (ref 8.9–10.3)
Creatinine, Ser: 0.79 mg/dL (ref 0.61–1.24)
GFR calc Af Amer: >60 mL/min (ref >60)
GLUCOSE: 155 mg/dL — AB (ref 65–99)
POTASSIUM: 3.3 mmol/L — AB (ref 3.5–5.1)
Sodium: 148 mmol/L — ABNORMAL HIGH (ref 135–145)

## 2015-04-09 LAB — CBC
HEMATOCRIT: 36.4 % — AB (ref 39.0–52.0)
HEMOGLOBIN: 11.3 g/dL — AB (ref 13.0–17.0)
MCH: 27.6 pg (ref 26.0–34.0)
MCHC: 31 g/dL (ref 30.0–36.0)
MCV: 89 fL (ref 78.0–100.0)
Platelets: 137 10*3/uL — ABNORMAL LOW (ref 150–400)
RBC: 4.09 MIL/uL — AB (ref 4.22–5.81)
RDW: 15 % (ref 11.5–15.5)
WBC: 7.7 10*3/uL (ref 4.0–10.5)

## 2015-04-09 LAB — SODIUM
Sodium: 146 mmol/L — ABNORMAL HIGH (ref 135–145)
Sodium: 147 mmol/L — ABNORMAL HIGH (ref 135–145)

## 2015-04-09 NOTE — Progress Notes (Signed)
STROKE TEAM PROGRESS NOTE   SUBJECTIVE (INTERVAL HISTORY) Patient up in chair at the bedside. Patient with no complaints. More awake and alert. Off cardene drip now. Still on 3% saline and Na 148   OBJECTIVE Temp:  [98.2 F (36.8 C)-99.5 F (37.5 C)] 98.2 F (36.8 C) (12/28 0720) Pulse Rate:  [42-100] 72 (12/28 0800) Cardiac Rhythm:  [-] Normal sinus rhythm (12/28 0800) Resp:  [19-37] 26 (12/28 0800) BP: (99-187)/(52-132) 143/86 mmHg (12/28 0800) SpO2:  [96 %-100 %] 100 % (12/28 0800)  CBC:   Recent Labs Lab 04/04/15 1818  04/08/15 0535 04/09/15 0600  WBC 6.4  < > 8.1 7.7  NEUTROABS 5.3  --   --   --   HGB 14.4  < > 13.0 11.3*  HCT 45.0  < > 40.7 36.4*  MCV 86.5  < > 87.9 89.0  PLT 164  < > 141* 137*  < > = values in this interval not displayed.  Basic Metabolic Panel:  Recent Labs Lab 04/08/15 0535  04/09/15 04/09/15 0600  NA 147*  < > 146* 148*  K 3.4*  --   --  3.3*  CL 113*  --   --  118*  CO2 25  --   --  23  GLUCOSE 114*  --   --  155*  BUN <5*  --   --  5*  CREATININE 0.78  --   --  0.79  CALCIUM 8.9  --   --  9.0  < > = values in this interval not displayed.  Lipid Panel:     Component Value Date/Time   CHOL 179 04/07/2015 0203   TRIG 67 04/07/2015 0203   HDL 96 04/07/2015 0203   CHOLHDL 1.9 04/07/2015 0203   VLDL 13 04/07/2015 0203   LDLCALC 70 04/07/2015 0203   HgbA1c:  Lab Results  Component Value Date   HGBA1C 6.1* 04/07/2015   Urine Drug Screen:     Component Value Date/Time   LABOPIA NONE DETECTED 04/04/2015 1755   COCAINSCRNUR NONE DETECTED 04/04/2015 1755   LABBENZ NONE DETECTED 04/04/2015 1755   AMPHETMU NONE DETECTED 04/04/2015 1755   THCU POSITIVE* 04/04/2015 1755   LABBARB NONE DETECTED 04/04/2015 1755      IMAGING I have personally reviewed the radiological images below and agree with the radiology interpretations. Blue text is my interpretation.  CT head   04/09/2015 - pending  04/07/2015 Evolving large RIGHT  frontal intraparenchymal hematoma. Similar 3 mm RIGHT to LEFT midline shift without ventricular entrapment. However, by my reading, pt midline shift has increased to 6mm, mass effect more prominent at superior frontal region.   04/05/2015: 1. Large RIGHT frontal intracranial hemorrhage is slightly contracted in volume. 2. Decrease in severity of mile leftward midline shift. 3. No new intracranial hemorrhage. 4. No extension to the ventricular system. 5. Basal cisterns are patent.  04/04/2015   Large acute right frontoparietal intracranial hemorrhage, with evidence of active extravasation and 8 mm leftward midline shift. No evidence of downward herniation.   2D echo -  - Left ventricle: The cavity size was normal. Wall thickness wasincreased in a pattern of moderate LVH. Systolic function wasmildly to moderately reduced. The estimated ejection fraction wasin the range of 40% to 45%. There is akinesis of themid-apicalinferolateral myocardium. Doppler parameters areconsistent with abnormal left ventricular relaxation (grade 1 diastolic dysfunction). - Mitral valve: There was mild regurgitation.  Impressions:  No cardiac source of emboli was indentified. Compared to theprior study, there  has been no significant interval change.  CUS  1-39% internal carotid artery stenosis bilaterally. Vertebral arteries are patent with antegrade flow.   PHYSICAL EXAM HEENT- Normocephalic, no lesions, without obvious abnormality. Normal external eye and conjunctiva.   Cardiovascular- regular rate and rhythm, S1, S2 normal, no murmur, click, rub or gallop, pulses palpable throughout   Lungs- chest clear, no wheezing, rales, normal symmetric air entry  Extremities- no edema  Neurological Examination Mental Status: Alert, oriented to name, date, day, month, year, thought content appropriate.dysarthria.Naming intact. No aphasia.   Cranial Nerves: II: Visual fields grossly normal, pupils  equal, round, reactive to light, R gaze preference, but able to cross midline III,IV, VI: ptosis not present, extra-ocular movements intact bilaterally V,VII: L facial droop, intact sensory bilat VIII: hearing normal to voice bilaterally IX,X: uvula rises symmetrically XI: bilateral shoulder shrug XII: midline tongue extension Motor: Right :Upper 5/5Left:Upper2-/5 Right Lower5/5Left Lower extremity 4+/5 Tone: Decreased tone left arm Sensory: symmetrical b/l DTR: 1+ throughout with left babinski positive Coordination: No dysmetria on the right. Cannot test left due to weakness Gait not tested due to weakness.   ASSESSMENT/PLAN Mr. Amelio Brosky is a 64 y.o. male with history of hypertension, coronary artery disease with previous MI, previous CVA, ischemic cardiomyopathy, hyperlipidemia, PVD, tobacco use, and alcohol abuse presenting with left hemiparesis and left facial droop. He did not receive IV t-PA due to intercranial hemorrhage.  Stroke:  Large acute right frontoparietal ICH with cerebral edema in setting of hypertension.   Resultant  Left hemiparesis, dysarthria, dysphagia  MRI with contrast and MRA head once blood absorbed  Repeat CT 04/07/15 increased midline shift and mass effect.  Repeat CT 04/09/2015 pending   Carotid Doppler  No significant stenosis   2D Echo EF 35-40%. No cardiac source of emboli identified.  LDL - 86  HgbA1c 6.4  VTE prophylaxis - heparin subq   aspirin 81 mg daily and clopidogrel 75 mg daily prior to admission, now on No antithrombotic secondary to hemorrhage.  Ongoing aggressive stroke risk factor management.  Therapy recommendations: CIR - per consult, pt does not have support at home and concerned about reaching functional independence - CIR vs SNF  Plan for transfer to floor tomorrow after 3% stopped  Disposition: pending   Cerebral edema  On 3% saline  Na Q6h, 141->148  Day 5 now and clinical stable, will  taper off 3% . Order written  Hx stroke/TIA, encephalomalacia  03/2012 R cortical motor strip infarcts  R frontal encephalomalacia  Concerning for embolic infarcts with low EF  Consider outpt 30 day cardiac event monitoring to look for embolic source  Dysphagia  Secondary to hemorrhage  DIET - DYS 1 Room service appropriate?: Yes; Fluid consistency:: Nectar Thick   ST following  Add ensure pudding (kozyshack)  continue IVF NS to 30cc/h due to poor po intake  Hypertensive Urgency  Stable this am  SBP goal to < 160  cardene off 0045  Resumed home po medications - coreg and losartan.  Increase losartan to  daily  Hyperlipidemia  Home meds:  Lipitor 80 mg daily prior to admission - held secondary to ICH.  LDL 86  Continue statin at discharge  Tobacco abuse  Current smoker  Smoking cessation counseling provided  Pt is willing to quit  Other Stroke Risk Factors  Advanced age  Family hx stroke (sister)  Coronary artery disease s/p stenting on DAPT prior to admission  Hospital day # 5  This patient is critically ill  due to right large ICH and HTN at significant risk of neurological worsening, death form cerebral edema and brain herniation. This patient's care requires constant monitoring of vital signs, hemodynamics, respiratory and cardiac monitoring, review of multiple databases, neurological assessment, discussion with family, other specialists and medical decision making of high complexity. I spent 35 minutes of neurocritical care time in the care of this patient.  Marvel Plan, MD PhD Stroke Neurology 04/09/2015 11:58 AM    To contact Stroke Continuity provider, please refer to [email or web address]. After hours, contact General Neurology

## 2015-04-09 NOTE — Progress Notes (Signed)
Chaplain at bedside with pt. I will meet with pt to discuss his rehab venue options as well as expected caregiver support available. Pt likely ready for inpt rehab or SNF rehab tomorrow per Dr. Roda Shutters. [phone]

## 2015-04-09 NOTE — Progress Notes (Signed)
Dr Roda Shutters made aware of pt's RUE with redness and swelling at [facility] region. Area warm to touch, painful, swollen, and hard to touch. Cap refill intact, able to move all digits without difficulty. Will continue to monitor.

## 2015-04-09 NOTE — Progress Notes (Signed)
Spoke with pt's sister, [name] Chance, by phone to discuss discharge planning.  Sister states prior to admission, she and pt lived together, and he was independent of all ADLS.  Sister states she will be available at dc to provide 24h/day care for pt as needed, and she is in good health.  She plans to visit pt later today, and is hopeful that pt will be able to do rehab therapy in-house.    Will continue to follow for discharge planning as pt progresses.    Quintella Baton, RN, BSN  Trauma/Neuro ICU Case Manager [phone]

## 2015-04-09 NOTE — Progress Notes (Signed)
Physical Therapy Treatment Patient Details Name: Randall Patel MRN: [id number] DOB: 13-Oct-1950 Today's Date: 04/09/2015    History of Present Illness pt presents with Large R Frontoparietal ICH.  pt with hx of HTN, MI, CAD, CVA, and Etoh.      PT Comments    Pt continues to make progress with mobility and worked on Manufacturing systems engineer in room today for visual input.  Pt continues with L lateral lean and R visual preference, but with cues is attending to these better.  Continue to feel pt would make great CIR candidate.  Will continue to follow.    Follow Up Recommendations  CIR     Equipment Recommendations  None recommended by PT    Recommendations for Other Services       Precautions / Restrictions Precautions Precautions: Fall Restrictions Weight Bearing Restrictions: No    Mobility  Bed Mobility Overal bed mobility: Needs Assistance Bed Mobility: Supine to Sit     Supine to sit: Min assist     General bed mobility comments: With increased time pt able to complete mroe of bed mobility.  A needed for scooting L hip closer to EOB.    Transfers Overall transfer level: Needs assistance Equipment used: 1 person hand held assist Transfers: Sit to/from [employer] Sit to Stand: Mod assist;+2 safety/equipment Stand pivot transfers: Mod assist;+2 safety/equipment       General transfer comment: pt able to complete transfer with one person A, but 2nd person present for line management and A with hygiene after using commode.    Ambulation/Gait Ambulation/Gait assistance: Mod assist;+2 safety/equipment Ambulation Distance (Feet): 5 Feet Assistive device: 1 person hand held assist Gait Pattern/deviations: Step-to pattern;Decreased step length - left;Decreased stance time - left;Decreased dorsiflexion - left     General Gait Details: pt needs cues for correcting L lateral lean and extending L LE in stance.  2nd person present to manage lines and chairs.      Stairs            Wheelchair Mobility    Modified Mark (Stroke Patients Only) Modified Crable (Stroke Patients Only) Pre-Morbid [place] Score: No symptoms Modified Teschner: Moderately severe disability     Balance Overall balance assessment: Needs assistance Sitting-balance support: No upper extremity supported;Feet supported Sitting balance-Leahy Scale: Fair   Postural control: Left lateral lean Standing balance support: During functional activity Standing balance-Leahy Scale: Poor Standing balance comment: Worked on standing in front of mirror attempting to give pt visual input to L lean and R visual bias.  pt needed cues to get all the way in front of mirror as he was leaving L side out of mirror.                      Cognition Arousal/Alertness: Awake/alert Behavior During Therapy: Flat affect Overall Cognitive Status: Impaired/Different from baseline Area of Impairment: Attention;Safety/judgement;Awareness;Problem solving   Current Attention Level: Selective     Safety/Judgement: Decreased awareness of safety;Decreased awareness of deficits Awareness: Emergent Problem Solving: Slow processing;Decreased initiation;Difficulty sequencing;Requires verbal cues;Requires tactile cues General Comments: pt continues with L sided neglect and needing cues for attending to L side.    Exercises      General Comments        Pertinent Vitals/Pain Pain Assessment: Faces Pain Score: 0-No pain    Home Living                      Prior Function  PT Goals (current goals can now be found in the care plan section) Acute Rehab PT Goals Patient Stated Goal: Walk PT Goal Formulation: With patient Time For Goal Achievement: 04/21/15 Potential to Achieve Goals: Good Progress towards PT goals: Progressing toward goals    Frequency  Min 4X/week    PT Plan Current plan remains appropriate    Co-evaluation             End of  Session Equipment Utilized During Treatment: Gait belt Activity Tolerance: Patient tolerated treatment well Patient left: in chair;with call bell/phone within reach;with chair alarm set     Time: 6045-4098 PT Time Calculation (min) (ACUTE ONLY): 26 min  Charges:  $Gait Training: 8-22 mins $Neuromuscular Re-education: 8-22 mins                    G CodesSunny Patel, Randall Patel [phone] 04/09/2015, 11:32 AM

## 2015-04-09 NOTE — Progress Notes (Signed)
Speech Language Pathology Treatment: Dysphagia  Patient Details Name: Hascal Garrod MRN: [id number] DOB: 06/16/1950 Today's Date: 04/09/2015 Time: 7356-7014 SLP Time Calculation (min) (ACUTE ONLY): 10 min  Assessment / Plan / Recommendation Clinical Impression  Pt seen for dysphagia treatment: demonstrating improved understanding of deficits today.  Able to state reason for modified diet and thickened liquids.  Consumed small sips of nectar-thick liquid with initial cue for rate and f/u swallow, then able to execute with intermittent cues.  No coughing observed.  Mod cues to attend to left.  Pt eager to D/C to rehab.  SLP to follow.    HPI HPI: Calbert Schoeff is an 64 y.o. male with a hx of stroke and CAD s/p stenting presents with acute onset of L sided weakness and found to have Large R ICH on CT. PMH: HTN, CAD, ETOH abuse, tobacco abuse.      SLP Plan  Continue with current plan of care     Recommendations  Diet recommendations: Dysphagia 1 (puree);Nectar-thick liquid Liquids provided via: Cup Medication Administration: Crushed with puree Supervision: Patient able to self feed;Full supervision/cueing for compensatory strategies Compensations: Slow rate;Small sips/bites;Lingual sweep for clearance of pocketing Postural Changes and/or Swallow Maneuvers: Seated upright 90 degrees              Oral Care Recommendations: Oral care BID Follow up Recommendations: Inpatient Rehab Plan: Continue with current plan of care   Blenda Mounts Laurice 04/09/2015, 3:44 PM  Marchelle Folks L. Samson Frederic, [place] CCC/SLP Pager [phone]

## 2015-04-10 ENCOUNTER — Inpatient Hospital Stay (HOSPITAL_COMMUNITY): Payer: Medicare Other

## 2015-04-10 ENCOUNTER — Inpatient Hospital Stay (HOSPITAL_COMMUNITY)
Admission: RE | Admit: 2015-04-10 | Discharge: 2015-04-25 | DRG: 057 | Disposition: A | Discharge status: Home Health | Payer: Medicare Other | Source: Intra-hospital | Attending: Physical Medicine & Rehabilitation | Admitting: Physical Medicine & Rehabilitation

## 2015-04-10 ENCOUNTER — Encounter (HOSPITAL_COMMUNITY): Payer: Self-pay | Admitting: *Deleted

## 2015-04-10 DIAGNOSIS — I2510 Atherosclerotic heart disease of native coronary artery without angina pectoris: Secondary | ICD-10-CM | POA: Diagnosis not present

## 2015-04-10 DIAGNOSIS — E785 Hyperlipidemia, unspecified: Secondary | ICD-10-CM

## 2015-04-10 DIAGNOSIS — R05 Cough: Secondary | ICD-10-CM

## 2015-04-10 DIAGNOSIS — Z7902 Long term (current) use of antithrombotics/antiplatelets: Secondary | ICD-10-CM | POA: Diagnosis not present

## 2015-04-10 DIAGNOSIS — I69198 Other sequelae of nontraumatic intracerebral hemorrhage: Secondary | ICD-10-CM | POA: Diagnosis not present

## 2015-04-10 DIAGNOSIS — G8194 Hemiplegia, unspecified affecting left nondominant side: Secondary | ICD-10-CM

## 2015-04-10 DIAGNOSIS — R059 Cough, unspecified: Secondary | ICD-10-CM | POA: Diagnosis present

## 2015-04-10 DIAGNOSIS — R269 Unspecified abnormalities of gait and mobility: Secondary | ICD-10-CM

## 2015-04-10 DIAGNOSIS — I63412 Cerebral infarction due to embolism of left middle cerebral artery: Secondary | ICD-10-CM | POA: Diagnosis not present

## 2015-04-10 DIAGNOSIS — F1290 Cannabis use, unspecified, uncomplicated: Secondary | ICD-10-CM

## 2015-04-10 DIAGNOSIS — E876 Hypokalemia: Secondary | ICD-10-CM

## 2015-04-10 DIAGNOSIS — Z23 Encounter for immunization: Secondary | ICD-10-CM | POA: Diagnosis not present

## 2015-04-10 DIAGNOSIS — I69354 Hemiplegia and hemiparesis following cerebral infarction affecting left non-dominant side: Secondary | ICD-10-CM | POA: Diagnosis not present

## 2015-04-10 DIAGNOSIS — I69191 Dysphagia following nontraumatic intracerebral hemorrhage: Secondary | ICD-10-CM | POA: Diagnosis not present

## 2015-04-10 DIAGNOSIS — Z79899 Other long term (current) drug therapy: Secondary | ICD-10-CM | POA: Diagnosis not present

## 2015-04-10 DIAGNOSIS — R1310 Dysphagia, unspecified: Secondary | ICD-10-CM | POA: Diagnosis not present

## 2015-04-10 DIAGNOSIS — F17210 Nicotine dependence, cigarettes, uncomplicated: Secondary | ICD-10-CM | POA: Diagnosis not present

## 2015-04-10 DIAGNOSIS — D62 Acute posthemorrhagic anemia: Secondary | ICD-10-CM | POA: Diagnosis not present

## 2015-04-10 DIAGNOSIS — I611 Nontraumatic intracerebral hemorrhage in hemisphere, cortical: Principal | ICD-10-CM

## 2015-04-10 DIAGNOSIS — I69154 Hemiplegia and hemiparesis following nontraumatic intracerebral hemorrhage affecting left non-dominant side: Principal | ICD-10-CM

## 2015-04-10 DIAGNOSIS — F329 Major depressive disorder, single episode, unspecified: Secondary | ICD-10-CM

## 2015-04-10 DIAGNOSIS — I252 Old myocardial infarction: Secondary | ICD-10-CM | POA: Diagnosis not present

## 2015-04-10 DIAGNOSIS — Z7982 Long term (current) use of aspirin: Secondary | ICD-10-CM

## 2015-04-10 DIAGNOSIS — I255 Ischemic cardiomyopathy: Secondary | ICD-10-CM | POA: Diagnosis not present

## 2015-04-10 DIAGNOSIS — I69991 Dysphagia following unspecified cerebrovascular disease: Secondary | ICD-10-CM | POA: Diagnosis present

## 2015-04-10 DIAGNOSIS — Z791 Long term (current) use of non-steroidal anti-inflammatories (NSAID): Secondary | ICD-10-CM

## 2015-04-10 DIAGNOSIS — M6289 Other specified disorders of muscle: Secondary | ICD-10-CM | POA: Insufficient documentation

## 2015-04-10 DIAGNOSIS — Z955 Presence of coronary angioplasty implant and graft: Secondary | ICD-10-CM | POA: Diagnosis not present

## 2015-04-10 DIAGNOSIS — M79601 Pain in right arm: Secondary | ICD-10-CM

## 2015-04-10 DIAGNOSIS — I69192 Facial weakness following nontraumatic intracerebral hemorrhage: Secondary | ICD-10-CM | POA: Diagnosis not present

## 2015-04-10 DIAGNOSIS — I10 Essential (primary) hypertension: Secondary | ICD-10-CM | POA: Diagnosis not present

## 2015-04-10 DIAGNOSIS — G729 Myopathy, unspecified: Secondary | ICD-10-CM | POA: Diagnosis not present

## 2015-04-10 LAB — BASIC METABOLIC PANEL WITH GFR
Anion gap: 6 (ref 5–15)
BUN: 8 mg/dL (ref 6–20)
CO2: 26 mmol/L (ref 22–32)
Calcium: 9.1 mg/dL (ref 8.9–10.3)
Chloride: 113 mmol/L — ABNORMAL HIGH (ref 101–111)
Creatinine, Ser: 0.86 mg/dL (ref 0.61–1.24)
GFR calc Af Amer: >60 mL/min
GFR calc non Af Amer: >60 mL/min
Glucose, Bld: 97 mg/dL (ref 65–99)
Potassium: 3.4 mmol/L — ABNORMAL LOW (ref 3.5–5.1)
Sodium: 145 mmol/L (ref 135–145)

## 2015-04-10 LAB — CBC
HCT: 34.9 % — ABNORMAL LOW (ref 39.0–52.0)
Hemoglobin: 11 g/dL — ABNORMAL LOW (ref 13.0–17.0)
MCH: 27.8 pg (ref 26.0–34.0)
MCHC: 31.5 g/dL (ref 30.0–36.0)
MCV: 88.1 fL (ref 78.0–100.0)
Platelets: 134 K/uL — ABNORMAL LOW (ref 150–400)
RBC: 3.96 MIL/uL — ABNORMAL LOW (ref 4.22–5.81)
RDW: 14.9 % (ref 11.5–15.5)
WBC: 5.9 K/uL (ref 4.0–10.5)

## 2015-04-10 MED ORDER — PANTOPRAZOLE SODIUM 40 MG PO TBEC
40.0000 mg | DELAYED_RELEASE_TABLET | Freq: Every day | ORAL | Status: DC
  Administered 2015-04-10 – 2015-04-24 (×15): 40 mg via ORAL
  Filled 2015-04-10 (×15): qty 1

## 2015-04-10 MED ORDER — ENSURE ENLIVE PO LIQD
237.0000 mL | Freq: Three times a day (TID) | ORAL | Status: DC
  Administered 2015-04-10 – 2015-04-23 (×27): 237 mL via ORAL

## 2015-04-10 MED ORDER — GUAIFENESIN-DM 100-10 MG/5ML PO SYRP
5.0000 mL | ORAL_SOLUTION | Freq: Four times a day (QID) | ORAL | Status: DC | PRN
Start: 2015-04-10 — End: 2015-04-25
  Administered 2015-04-12 – 2015-04-14 (×4): 10 mL via ORAL
  Filled 2015-04-10 (×4): qty 10

## 2015-04-10 MED ORDER — SENNOSIDES-DOCUSATE SODIUM 8.6-50 MG PO TABS: 1.0000 | ORAL_TABLET | Freq: Every evening | ORAL | Status: DC | PRN

## 2015-04-10 MED ORDER — ALUM & MAG HYDROXIDE-SIMETH 200-200-20 MG/5ML PO SUSP: 30.0000 mL | ORAL | Status: DC | PRN

## 2015-04-10 MED ORDER — SODIUM CHLORIDE 0.9 % IJ SOLN
3.0000 mL | Freq: Two times a day (BID) | INTRAMUSCULAR | Status: DC
  Administered 2015-04-10 – 2015-04-11 (×2): 3 mL via INTRAVENOUS

## 2015-04-10 MED ORDER — INFLUENZA VAC SPLIT QUAD 0.5 ML IM SUSY
0.5000 mL | PREFILLED_SYRINGE | INTRAMUSCULAR | Status: AC
  Administered 2015-04-11: 0.5 mL via INTRAMUSCULAR
  Filled 2015-04-10: qty 0.5

## 2015-04-10 MED ORDER — HEPARIN SODIUM (PORCINE) 5000 UNIT/ML IJ SOLN
5000.0000 [IU] | Freq: Three times a day (TID) | INTRAMUSCULAR | Status: DC
  Administered 2015-04-10 – 2015-04-25 (×44): 5000 [IU] via SUBCUTANEOUS
  Filled 2015-04-10 (×44): qty 1

## 2015-04-10 MED ORDER — RESOURCE THICKENUP CLEAR PO POWD
ORAL | Status: DC | PRN
  Filled 2015-04-10: qty 125

## 2015-04-10 MED ORDER — DIPHENHYDRAMINE HCL 12.5 MG/5ML PO ELIX
12.5000 mg | ORAL_SOLUTION | Freq: Four times a day (QID) | ORAL | Status: DC | PRN
  Filled 2015-04-10: qty 10

## 2015-04-10 MED ORDER — CETYLPYRIDINIUM CHLORIDE 0.05 % MT LIQD
7.0000 mL | Freq: Two times a day (BID) | OROMUCOSAL | Status: DC
  Administered 2015-04-10 – 2015-04-24 (×25): 7 mL via OROMUCOSAL

## 2015-04-10 MED ORDER — PROCHLORPERAZINE EDISYLATE 5 MG/ML IJ SOLN
5.0000 mg | Freq: Four times a day (QID) | INTRAMUSCULAR | Status: DC | PRN
Start: 2015-04-10 — End: 2015-04-25

## 2015-04-10 MED ORDER — ACETAMINOPHEN 325 MG PO TABS: 325.0000 mg | ORAL_TABLET | ORAL | Status: DC | PRN

## 2015-04-10 MED ORDER — SENNOSIDES-DOCUSATE SODIUM 8.6-50 MG PO TABS
1.0000 | ORAL_TABLET | Freq: Two times a day (BID) | ORAL | Status: DC
Start: 2015-04-10 — End: 2015-04-17
  Administered 2015-04-10 – 2015-04-17 (×14): 1 via ORAL
  Filled 2015-04-10 (×14): qty 1

## 2015-04-10 MED ORDER — TRAZODONE HCL 50 MG PO TABS: 25.0000 mg | ORAL_TABLET | Freq: Every evening | ORAL | Status: DC | PRN

## 2015-04-10 MED ORDER — LOSARTAN POTASSIUM 50 MG PO TABS
100.0000 mg | ORAL_TABLET | Freq: Every day | ORAL | Status: DC
  Administered 2015-04-11 – 2015-04-25 (×15): 100 mg via ORAL
  Filled 2015-04-10 (×16): qty 2

## 2015-04-10 MED ORDER — METHOCARBAMOL 500 MG PO TABS: 500.0000 mg | ORAL_TABLET | Freq: Four times a day (QID) | ORAL | Status: DC | PRN

## 2015-04-10 MED ORDER — PROCHLORPERAZINE 25 MG RE SUPP: 12.5000 mg | Freq: Four times a day (QID) | RECTAL | Status: DC | PRN

## 2015-04-10 MED ORDER — BISACODYL 10 MG RE SUPP: 10.0000 mg | Freq: Every day | RECTAL | Status: DC | PRN

## 2015-04-10 MED ORDER — PNEUMOCOCCAL VAC POLYVALENT 25 MCG/0.5ML IJ INJ
0.5000 mL | INJECTION | INTRAMUSCULAR | Status: AC
  Administered 2015-04-11: 0.5 mL via INTRAMUSCULAR
  Filled 2015-04-10: qty 0.5

## 2015-04-10 MED ORDER — PROCHLORPERAZINE MALEATE 5 MG PO TABS: 5.0000 mg | ORAL_TABLET | Freq: Four times a day (QID) | ORAL | Status: DC | PRN

## 2015-04-10 MED ORDER — CARVEDILOL 6.25 MG PO TABS
6.2500 mg | ORAL_TABLET | Freq: Two times a day (BID) | ORAL | Status: DC
  Administered 2015-04-10 – 2015-04-11 (×2): 6.25 mg via ORAL
  Filled 2015-04-10 (×2): qty 1

## 2015-04-10 NOTE — Progress Notes (Signed)
VASCULAR LAB PRELIMINARY  PRELIMINARY  PRELIMINARY  PRELIMINARY  Right Upper Venous Doppler completed. Deep Vein Thrombus  at the junction of the  Right cephalic vein and Brachial vein shoulder area. Suggesting Brachial vein has no evidence of thrombus. Superificial Acute thrombus identified in the Right Cephalic Vein extends down to the forearm Randall Patel, RVT, RDMS 04/10/2015, 12:46 PM

## 2015-04-10 NOTE — Progress Notes (Signed)
PMR Admission Coordinator Pre-Admission Assessment  Patient: Randall Patel is an 64 y.o., male MRN: [id number] DOB: 02-Aug-1950 Height:  (165.1 cm) Weight: 71.1 kg (156 lb 12 oz)  Insurance Information HMO:  PPO: PCP: IPA: 80/20: yes OTHER: no HMO PRIMARY: Medicare a and b Policy#: [id number] Subscriber: pt Benefits: Phone #: passport one Name: 04/10/2015 Eff. Date: 10/10/2009 Deduct: $1288 Out of Pocket Max: none Life Max: none CIR: 100% SNF: 20 full days Outpatient: 80% Co-Pay: 20% Home Health: 100% Co-Pay: none DME: 80% Co-Pay: 20% Providers: pt choice  SECONDARY: Medicaid of [place] Policy#: [id number] p Subscriber: pt  Medicaid Application Date: Case Manager:  Disability Application Date: Case Worker:   Emergency Contact Information Contact Information    Name Relation Home Work Mobile   [place] Sister [phone]     [place], [place]   [phone]     Current Medical History  Patient Admitting Diagnosis: right frontoparietal ICH  History of Present Illness: Randall Patel is a 64 y.o. 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 Presented 04/04/2015 with headache and blood pressure 189/128. Denies any recent trauma. Cranial CT scan showed a large right frontoparietal intracerebral hemorrhage. Neurosurgery Dr. Marikay Alar consulted and advised conservative care. Placed on Cardene drip for blood pressure control. Aspirin and Plavix discontinued secondary to ICH. Follow-up cranial CT scan 04/07/2015 with evolving right frontal intraparenchymal hematoma right to left midline shift without ventricular entrapment. Maintain on  3% normal saline later discontinued latest sodium 148. And discontinued 3 % on 04/09/15. Echocardiogram with ejection fraction of 45% grade 1 diastolic dysfunction. Consider outpt 30 day cardiac event monitoring to look for embolic source. Neurology consulted for follow-up. Later placed on subcutaneous heparin for DVT prophylaxis 04/07/2015. Failed modified barium swallow dysphagia 1 nectar thick liquids. On 12/28 noted pt's RUE with redness and swelling at [facility] region. Warm to touch, swollen, and hard to touch. Dopplers pending this a. m. 04/10/15 but found to be negative for dvt of the right arm at 1310.   Total: 8 NIH    Past Medical History  Past Medical History  Diagnosis Date  . Hypertension   . Coronary artery disease     MI in 2008 with PCI to ? vessel; NSTEMI 03/2012; NSTEMI 04/2012 Cath showed triple vessel dz including occluded RCA, s/p DES to distal RCA, PTCA posterolateral branch   . CVA (cerebral infarction)     03/2012 with left arm weakness/numbness and left facial droop.  . Ischemic cardiomyopathy     04/2012 EF 35-40%  . Hyperlipidemia   . Claudication (HCC)   . Tobacco abuse     quit 04/06/12  . ETOH abuse     quit 04/06/12    Family History  family history includes Cancer in his mother; Heart attack in his father; Hypertension in his brother, father, mother, and sister; Stroke in his sister.  Prior Rehab/Hospitalizations:  Has the patient had major surgery during 100 days prior to admission? No  Current Medications   Current facility-administered medications:  . 0.9 % sodium chloride infusion, , Intravenous, Continuous, Marvel Plan, MD, Last Rate: 30 mL/hr at 04/09/15 1726 . acetaminophen (TYLENOL) tablet 650 mg, 650 mg, Oral, Q4H PRN, 650 mg at 04/09/15 2057 **OR** acetaminophen (TYLENOL) suppository 650 mg, 650 mg, Rectal, Q4H PRN, Lenise Herald, MD . antiseptic oral rinse (CPC / CETYLPYRIDINIUM CHLORIDE 0.05%)  solution 7 mL, 7 mL, Mouth Rinse, BID, Pramod S  Pearlean Brownie, MD, 7 mL at 04/09/15 2124 . carvedilol (COREG) tablet 6.25 mg, 6.25 mg, Oral, BID WC, Layne Benton, NP, 6.25 mg at 04/10/15 0802 . feeding supplement (ENSURE ENLIVE) (ENSURE ENLIVE) liquid 237 mL, 237 mL, Oral, TID BM, Marvel Plan, MD, 237 mL at 04/09/15 2047 . heparin injection 5,000 Units, 5,000 Units, Subcutaneous, 3 times per day, Marvel Plan, MD, 5,000 Units at 04/10/15 0729 . labetalol (NORMODYNE,TRANDATE) injection 10-40 mg, 10-40 mg, Intravenous, Q10 min PRN, Lenise Herald, MD, 20 mg at 04/09/15 2135 . losartan (COZAAR) tablet 100 mg, 100 mg, Oral, Daily, Marvel Plan, MD, 100 mg at 04/09/15 0951 . nicardipine (CARDENE) 20mg  in 0.86% saline IV infusion (0.1 mg/ml), 3-15 mg/hr, Intravenous, Continuous, Ramond Marrow, DO, Stopped at 04/09/15 0045 . pantoprazole (PROTONIX) EC tablet 40 mg, 40 mg, Oral, Daily, Darl Householder Masters, RPH, 40 mg at 04/09/15 2057 . senna-docusate (Senokot-S) tablet 1 tablet, 1 tablet, Oral, BID, Lenise Herald, MD, 1 tablet at 04/09/15 [phone] . sodium chloride (hypertonic) 3 % solution, , Intravenous, Continuous, Rejeana Brock, MD, Stopped at 04/09/15 2140 . sodium chloride 0.9 % injection 3 mL, 3 mL, Intravenous, Q12H, Ramond Marrow, DO, 3 mL at 04/09/15 2126  Patients Current Diet: DIET - DYS 1 Room service appropriate?: Yes; Fluid consistency:: Nectar Thick  Precautions / Restrictions Precautions Precautions: Fall Restrictions Weight Bearing Restrictions: No   Has the patient had 2 or more falls or a fall with injury in the past year?Yes  Prior Activity Level Community (5-7x/wk): Independent and driving pta. Disabled after an MI 2010.  Home Assistive Devices / Equipment Home Assistive Devices/Equipment: None Home Equipment: None  Prior Device Use: Indicate devices/aids used by the patient prior to current illness, exacerbation or injury? None of the above  Prior  Functional Level Prior Function Level of Independence: Independent Comments: drives  Self Care: Did the patient need help bathing, dressing, using the toilet or eating? Independent  Indoor Mobility: Did the patient need assistance with walking from room to room (with or without device)? Independent  Stairs: Did the patient need assistance with internal or external stairs (with or without device)? Independent  Functional Cognition: Did the patient need help planning regular tasks such as shopping or remembering to take medications? Independent  Current Functional Level Cognition  Arousal/Alertness: Awake/alert Overall Cognitive Status: Impaired/Different from baseline Current Attention Level: Selective Orientation Level: Oriented X4 Safety/Judgement: Decreased awareness of safety, Decreased awareness of deficits General Comments: pt continues with L sided neglect and needing cues for attending to L side. Attention: Sustained Sustained Attention: Impaired Sustained Attention Impairment: Functional basic Memory: Appears intact Awareness: Impaired Awareness Impairment: Intellectual impairment Problem Solving: Impaired Problem Solving Impairment: Verbal complex, Functional complex Executive Function: Reasoning Reasoning: Impaired Reasoning Impairment: Verbal complex, Functional complex Behaviors: (flat affect)   Extremity Assessment (includes Sensation/Coordination)  Upper Extremity Assessment: LUE deficits/detail LUE Deficits / Details: no active movement LUE Sensation: decreased proprioception (neglect) LUE Coordination: decreased fine motor, decreased gross motor  Lower Extremity Assessment: Defer to PT evaluation LLE Deficits / Details: Strength grossly 2/5 with intact sensation.  LLE Coordination: decreased fine motor, decreased gross motor    ADLs  Overall ADL's : Needs assistance/impaired Eating/Feeding: Minimal assistance, Sitting Grooming: Wash/dry  face, Sitting, Minimal assistance Upper Body Bathing: Sitting, Maximal assistance Lower Body Bathing: Sit to/from stand, Maximal assistance, +2 for physical assistance Upper Body Dressing : Maximal assistance, Sitting Lower Body Dressing: +2 for physical assistance, Minimal assistance, Sit to/from stand Toilet Transfer: +2  for physical assistance, Minimal assistance, Stand-pivot, BSC Toileting- Clothing Manipulation and Hygiene: +2 for safety/equipment, Maximal assistance, Sit to/from stand    Mobility  Overal bed mobility: Needs Assistance Bed Mobility: Supine to Sit Rolling: Max assist Supine to sit: Min assist General bed mobility comments: With increased time pt able to complete mroe of bed mobility. A needed for scooting L hip closer to EOB.     Transfers  Overall transfer level: Needs assistance Equipment used: 1 person hand held assist Transfers: Sit to/from Stand, Stand Pivot Transfers Sit to Stand: Mod assist, +2 safety/equipment Stand pivot transfers: Mod assist, +2 safety/equipment General transfer comment: pt able to complete transfer with one person A, but 2nd person present for line management and A with hygiene after using commode.     Ambulation / Gait / Stairs / Wheelchair Mobility  Ambulation/Gait Ambulation/Gait assistance: Mod assist, +2 safety/equipment Ambulation Distance (Feet): 5 Feet Assistive device: 1 person hand held assist Gait Pattern/deviations: Step-to pattern, Decreased step length - left, Decreased stance time - left, Decreased dorsiflexion - left General Gait Details: pt needs cues for correcting L lateral lean and extending L LE in stance. 2nd person present to manage lines and chairs.     Posture / Balance Balance Overall balance assessment: Needs assistance Sitting-balance support: No upper extremity supported, Feet supported Sitting balance-Leahy Scale: Fair Postural control: Left lateral lean Standing balance support: During  functional activity Standing balance-Leahy Scale: Poor Standing balance comment: Worked on standing in front of mirror attempting to give pt visual input to L lean and R visual bias. pt needed cues to get all the way in front of mirror as he was leaving L side out of mirror.     Special needs/care consideration Skin intact  Bowel mgmt:LBM 04/09/15 Bladder mgmt: incontinent Diabetic mgmt Hgb A1c 6.4 Alcohol abuse pta Smoker   Previous Home Environment Living Arrangements: Other (Comment) (Lives with his sister [name], [place] GF across the street, s) Lives With: Family, Other (Comment) (sister, [name]) Available Help at Discharge: Family, Available 24 hours/day, Other (Comment) (sister in school to get GED, plans to take leave to provide ) Type of Home: House Home Layout: One level Home Access: Stairs to enter Entrance Stairs-Rails: Right, Left, Can reach both Entrance Stairs-Number of Steps: 4 Bathroom Shower/Tub: Tub/shower unit, Engineer, building services: Standard Bathroom Accessibility: Yes How Accessible: Accessible via walker Home Care Services: No Additional Comments: drives, was drinking 3 to 6 beers daily and smokes pack cigs every two days  Discharge Living Setting Plans for Discharge Living Setting: Lives with (comment), Other (Comment) (sister, [name] Chance) Type of Home at Discharge: House Discharge Home Layout: One level Discharge Home Access: Stairs to enter Entrance Stairs-Rails: Right, Left, Can reach both Entrance Stairs-Number of Steps: 4 Discharge Bathroom Shower/Tub: Tub/shower unit, Curtain Discharge Bathroom Toilet: Standard Discharge Bathroom Accessibility: Yes How Accessible: Accessible via walker Does the patient have any problems obtaining your medications?: No  Social/Family/Support Systems Patient Roles: Parent, Other (Comment) (Has one son, Baran Kuhrt) Contact Information: [name] Chance sister  is main contact Anticipated Caregiver: sister and brother, Fayrene Fearing Anticipated Industrial/product designer Information: see above Ability/Limitations of Caregiver: none Caregiver Availability: 24/7 Discharge Plan Discussed with Primary Caregiver: Yes Is Caregiver In Agreement with Plan?: Yes Does Caregiver/Family have Issues with Lodging/Transportation while Pt is in Rehab?: No   Goals/Additional Needs Patient/Family Goal for Rehab: supervision to min assist with PT, OT, and SLP Expected length of stay: ELOS 14 - 20  days Pt/Family Agrees to Admission and willing to participate: Yes Program Orientation Provided & Reviewed with Pt/Caregiver Including Roles & Responsibilities: Yes  Decrease burden of Care through IP rehab admission: n/a  Possible need for SNF placement upon discharge:not anticipated  Patient Condition: This patient's medical and functional status has changed since the consult dated: 04/08/2015 in which the Rehabilitation Physician determined and documented that the patient's condition is appropriate for intensive rehabilitative care in an inpatient rehabilitation facility. See "History of Present Illness" (above) for medical update. Functional changes are: moderate assist. Patient's medical and functional status update has been discussed with the Rehabilitation physician and patient remains appropriate for inpatient rehabilitation. Will admit to inpatient rehab today.  Preadmission Screen Completed By: Clois Dupes, 04/10/2015 9:15 AM ______________________________________________________________________  Discussed status with Dr. Wynn Banker on 04/10/15 at [phone] and received telephone approval for admission today.  Admission Coordinator: Clois Dupes, time 7619 Date 04/10/2015.          Cosigned by: Erick Colace, MD at 04/10/2015 1:59 PM  Revision History     Date/Time User Provider Type Action   04/10/2015 1:59 PM Erick Colace, MD  Physician Cosign   04/10/2015 1:57 PM Standley Brooking, RN Rehab Admission Coordinator Sign   04/10/2015 1:23 PM Standley Brooking, RN Rehab Admission Coordinator Incomplete Revision   04/10/2015 9:15 AM Standley Brooking, RN Rehab Admission Coordinator Sign   View Details Report

## 2015-04-10 NOTE — Progress Notes (Signed)
Spoke with RN at 12:55 pm.

## 2015-04-10 NOTE — H&P (Signed)
Physical Medicine and Rehabilitation Admission H&P   Chief Complaint  Patient presents with  . 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 UDS positive for THC. CT head done revealing large right frontoparietal ICH and he was started on cardene drip and IV mannitol. Chronic ASA/plavix discontinued and Dr. [name] recommended conservative management with serial CCT. 2D echo done revealing EF 40-45% with moderate LVH and mild MVR. Repeat CT head with increase in midline shift and mass effect and mannitol changed to hypertonic saline with goal 150-155.  Patient has had confusion with fluctuating bouts of lethargy. Carotid dopplers without significant ICA stenosis. MBS done 12/27 and patient was placed on dysphagia 1, nectar liquids. Dr. Erlinda Hong feels that patient had R-ICH with cerebral edema in setting of HTN and to consider outpatient 30 day cardiac event monitor to rule out embolic source. Hypertonic saline d/c yesterday and sodium normalizing. Blood pressures remain labile and po intake is poor. Patient with resultant LUE > LLE weakness, balance deficits, left inattention, poor safety awareness and dysphagia.Right upper ext pain and swelling evaluated today.  Evidence of R cephalic but no axillary DVT.  No anticoagulation recommended CIR recommended by MD and rehab team for follow up therapy and patient cleared medically today.    Review of Systems  HENT:   Needs multiple teeth extracted.  Eyes: Negative for blurred vision and double vision.  Respiratory: Positive for cough and sputum production. Negative for shortness of breath.  Cardiovascular: Negative for chest pain and palpitations.  Gastrointestinal: Negative for heartburn, nausea, vomiting and  constipation.  Genitourinary: Negative for dysuria and urgency.  Musculoskeletal: Negative for myalgias, back pain and neck pain.  Skin: Negative for itching.  Neurological: Positive for speech change and focal weakness. Negative for dizziness, tingling, sensory change and headaches.  Psychiatric/Behavioral: The patient has insomnia.    Past Medical History  Diagnosis Date  . Hypertension   . Coronary artery disease     MI in 2008 with PCI to ? vessel; NSTEMI 03/2012; NSTEMI 04/2012 Cath showed triple vessel dz including occluded RCA, s/p DES to distal RCA, PTCA posterolateral branch   . CVA (cerebral infarction)     03/2012 with left arm weakness/numbness and left facial droop.  . Ischemic cardiomyopathy     04/2012 EF 35-40%  . Hyperlipidemia   . Claudication ([place])   . Tobacco abuse     quit 04/06/12  . ETOH abuse     quit 04/06/12    Past Surgical History  Procedure Laterality Date  . Hernia repair    . Left heart catheterization with coronary angiogram N/A 04/11/2012    Procedure: LEFT HEART CATHETERIZATION WITH CORONARY ANGIOGRAM; Surgeon: Burnell Blanks, MD; Location: [facility] CATH LAB; Service: Cardiovascular; Laterality: N/A;  . Percutaneous coronary stent intervention (pci-s)  04/11/2012    Procedure: PERCUTANEOUS CORONARY STENT INTERVENTION (PCI-S); Surgeon: Burnell Blanks, MD; Location: [facility] CATH LAB; Service: Cardiovascular;; Distal RCA  . Percutaneous coronary intervention-balloon only  04/11/2012    Procedure: PERCUTANEOUS CORONARY INTERVENTION-BALLOON ONLY; Surgeon: Burnell Blanks, MD; Location: [facility] CATH LAB; Service: Cardiovascular;; RPLA    Family History  Problem Relation Age of Onset  . Cancer Mother   . Heart attack Father   . Hypertension Mother   .  Hypertension Father   . Hypertension Sister   . Hypertension Brother   .  Stroke Sister      Social History: Lives with sister. Independent PTA. He used to work in [employer] in [place] been disabled since 2008 due to [place]. Sister getting GED at [facility]. Sister and brother can provide assistance after discharge. He reports that he has been smoking Cigarettes.--about 1/2-1/3 PPD. He has a 40 pack-year smoking history. He has never used smokeless tobacco. He reports that he drinks alcohol--3 beers and 3 shots of liquor daily. He reports that he uses marijuana daily.    Allergies  Allergen Reactions  . Lisinopril Swelling and Other (See Comments)    angioedema    Medications Prior to Admission  Medication Sig Dispense Refill  . aspirin 81 MG chewable tablet Chew 1 tablet (81 mg total) by mouth daily.    [name] atorvastatin (LIPITOR) 80 MG tablet Take 1 tablet (80 mg total) by mouth daily with breakfast. 90 tablet 3  . carvedilol (COREG) 6.25 MG tablet Take 1 tablet (6.25 mg total) by mouth 2 (two) times daily with a meal. (Patient taking differently: Take 6.25 mg by mouth daily. ) 180 tablet 3  . clopidogrel (PLAVIX) 75 MG tablet Take 1 tablet (75 mg total) by mouth daily. 90 tablet 3  . furosemide (LASIX) 20 MG tablet Take 1 tablet (20 mg total) by mouth daily. 90 tablet 3  . losartan (COZAAR) 25 MG tablet Take 1 tablet (25 mg total) by mouth daily. 90 tablet 3  . meloxicam (MOBIC) 7.5 MG tablet Take 15 mg by mouth daily.     . nitroGLYCERIN (NITROSTAT) 0.4 MG SL tablet Place 1 tablet (0.4 mg total) under the tongue every 5 (five) minutes as needed for chest pain (up to 3 doses). 25 tablet 3    Home: Home Living Family/patient expects to be discharged to:: Private residence Living Arrangements: Other (Comment) (Lives with his sister Harolyn Rutherford, [place] GF across the street, s) Available Help at Discharge: Family, Available 24 hours/day, Other (Comment) (sister in school to get GED, plans to take leave to  provide ) Type of Home: House Home Access: Stairs to enter [employer] of Steps: 4 Entrance Stairs-Rails: Right, Left, Can reach both Home Layout: One level Bathroom Shower/Tub: Tub/shower unit, Architectural technologist: Standard Bathroom Accessibility: Yes Home Equipment: None Additional Comments: drives, was drinking 3 to 6 beers daily and smokes pack cigs every two days Lives With: Family, Other (Comment) (sister, Harolyn Rutherford)  Functional History: Prior Function Level of Independence: Independent Comments: drives  Functional Status:  Mobility: Bed Mobility Overal bed mobility: Needs Assistance Bed Mobility: Supine to Sit Rolling: Max assist Supine to sit: Min assist General bed mobility comments: With increased time pt able to complete mroe of bed mobility. A needed for scooting L hip closer to EOB.  Transfers Overall transfer level: Needs assistance Equipment used: 1 person hand held assist Transfers: Sit to/from Stand, Stand Pivot Transfers Sit to Stand: Mod assist, +2 safety/equipment Stand pivot transfers: Mod assist, +2 safety/equipment General transfer comment: pt able to complete transfer with one person A, but 2nd person present for line management and A with hygiene after using commode.  Ambulation/Gait Ambulation/Gait assistance: Mod assist, +2 safety/equipment Ambulation Distance (Feet): 5 Feet Assistive device: 1 person hand held assist Gait Pattern/deviations: Step-to pattern, Decreased step length - left, Decreased stance time - left, Decreased dorsiflexion - left General Gait Details: pt needs cues for correcting L lateral  lean and extending L LE in stance. 2nd person present to manage lines and chairs.     ADL: ADL Overall ADL's : Needs assistance/impaired Eating/Feeding: Minimal assistance, Sitting Grooming: Wash/dry face, Sitting, Minimal assistance Upper Body Bathing: Sitting, Maximal assistance Lower Body Bathing: Sit to/from  stand, Maximal assistance, +2 for physical assistance Upper Body Dressing : Maximal assistance, Sitting Lower Body Dressing: +2 for physical assistance, Minimal assistance, Sit to/from stand Toilet Transfer: +2 for physical assistance, Minimal assistance, Stand-pivot, BSC Toileting- Clothing Manipulation and Hygiene: +2 for safety/equipment, Maximal assistance, Sit to/from stand  Cognition: Cognition Overall Cognitive Status: Impaired/Different from baseline Arousal/Alertness: Awake/alert Orientation Level: Oriented X4 Attention: Sustained Sustained Attention: Impaired Sustained Attention Impairment: Functional basic Memory: Appears intact Awareness: Impaired Awareness Impairment: Intellectual impairment Problem Solving: Impaired Problem Solving Impairment: Verbal complex, Functional complex Executive Function: Reasoning Reasoning: Impaired Reasoning Impairment: Verbal complex, Functional complex Behaviors: (flat affect) Cognition Arousal/Alertness: Awake/alert Behavior During Therapy: Flat affect Overall Cognitive Status: Impaired/Different from baseline Area of Impairment: Attention, Safety/judgement, Awareness, Problem solving Current Attention Level: Selective Safety/Judgement: Decreased awareness of safety, Decreased awareness of deficits Awareness: Emergent Problem Solving: Slow processing, Decreased initiation, Difficulty sequencing, Requires verbal cues, Requires tactile cues General Comments: pt continues with L sided neglect and needing cues for attending to L side.   Blood pressure 154/108, pulse 57, temperature 97.9 F (36.6 C), temperature source Oral, resp. rate 30, height '5\' 5"'  (1.651 m), weight 71.1 kg (156 lb 12 oz), SpO2 100 %. Physical Exam  Nursing note and vitals reviewed. Constitutional: He is oriented to person, place, and time. He appears well-developed and well-nourished.  HENT:  Head: Normocephalic and atraumatic.  Eyes: Conjunctivae are normal.  Pupils are equal, round, and reactive to light. Right eye exhibits no discharge. Left eye exhibits no discharge.  Neck: Normal range of motion. Neck supple.  Cardiovascular: Normal rate and regular rhythm.  Respiratory: Effort normal and breath sounds normal. No respiratory distress. He has no wheezes.  GI: Soft. Bowel sounds are normal. He exhibits no distension. There is no tenderness.  Musculoskeletal: He exhibits no edema or tenderness.  R- distal biceps with painful and indurated area. Hard cord in right antecubital fossa likely due to thrombosed vein  Neurological: He is alert and oriented to person, place, and time. Coordination abnormal.  Left facial weakness with mild dysarthria and has occasional wet voice. Tongue with deviation to left. Able to follow basic one and two step motor commands. Sensation intact. Has mild right inattention but scans to right without difficulty. RUE 4/5 delt, 3- Biceps due to pain 5/5 triceps and grip 0/5 LUE 4/5 L KE, 3/5 L HF and 3- ADF 5/5 in RLE Sensation intact BUE and BLE  Skin: Skin is warm and dry. There is erythema.     Lab Results Last 48 Hours    Results for orders placed or performed during the hospital encounter of 04/04/15 (from the past 48 hour(s))  Sodium Status: Abnormal   Collection Time: 04/08/15 12:23 PM  Result Value Ref Range   Sodium 148 (H) 135 - 145 mmol/L  Sodium Status: Abnormal   Collection Time: 04/08/15 6:35 PM  Result Value Ref Range   Sodium 148 (H) 135 - 145 mmol/L  Sodium Status: Abnormal   Collection Time: 04/09/15 12:00 AM  Result Value Ref Range   Sodium 146 (H) 135 - 145 mmol/L  CBC Status: Abnormal   Collection Time: 04/09/15 6:00 AM  Result Value Ref Range   WBC 7.7 4.0 -  10.5 K/uL   RBC 4.09 (L) 4.22 - 5.81 MIL/uL   Hemoglobin 11.3 (L) 13.0 - 17.0 g/dL   HCT 36.4 (L) 39.0 - 52.0 %   MCV 89.0 78.0 - 100.0 fL   MCH 27.6  26.0 - 34.0 pg   MCHC 31.0 30.0 - 36.0 g/dL   RDW 15.0 11.5 - 15.5 %   Platelets 137 (L) 150 - 400 K/uL  Basic metabolic panel Status: Abnormal   Collection Time: 04/09/15 6:00 AM  Result Value Ref Range   Sodium 148 (H) 135 - 145 mmol/L   Potassium 3.3 (L) 3.5 - 5.1 mmol/L   Chloride 118 (H) 101 - 111 mmol/L   CO2 23 22 - 32 mmol/L   Glucose, Bld 155 (H) 65 - 99 mg/dL   BUN 5 (L) 6 - 20 mg/dL   Creatinine, Ser 0.79 0.61 - 1.24 mg/dL   Calcium 9.0 8.9 - 10.3 mg/dL   GFR calc non Af Amer >60 >60 mL/min   GFR calc Af Amer >60 >60 mL/min    Comment: (NOTE) The eGFR has been calculated using the CKD EPI equation. This calculation has not been validated in all clinical situations. eGFR's persistently <60 mL/min signify possible Chronic Kidney Disease.    Anion gap 7 5 - 15  Sodium Status: Abnormal   Collection Time: 04/09/15 5:25 PM  Result Value Ref Range   Sodium 147 (H) 135 - 145 mmol/L  CBC Status: Abnormal   Collection Time: 04/10/15 6:56 AM  Result Value Ref Range   WBC 5.9 4.0 - 10.5 K/uL   RBC 3.96 (L) 4.22 - 5.81 MIL/uL   Hemoglobin 11.0 (L) 13.0 - 17.0 g/dL   HCT 34.9 (L) 39.0 - 52.0 %   MCV 88.1 78.0 - 100.0 fL   MCH 27.8 26.0 - 34.0 pg   MCHC 31.5 30.0 - 36.0 g/dL   RDW 14.9 11.5 - 15.5 %   Platelets 134 (L) 150 - 400 K/uL  Basic metabolic panel Status: Abnormal   Collection Time: 04/10/15 6:56 AM  Result Value Ref Range   Sodium 145 135 - 145 mmol/L   Potassium 3.4 (L) 3.5 - 5.1 mmol/L   Chloride 113 (H) 101 - 111 mmol/L   CO2 26 22 - 32 mmol/L   Glucose, Bld 97 65 - 99 mg/dL   BUN 8 6 - 20 mg/dL   Creatinine, Ser 0.86 0.61 - 1.24 mg/dL   Calcium 9.1 8.9 - 10.3 mg/dL   GFR calc non Af Amer >60 >60 mL/min   GFR calc Af Amer >60 >60 mL/min    Comment: (NOTE) The eGFR has  been calculated using the CKD EPI equation. This calculation has not been validated in all clinical situations. eGFR's persistently <60 mL/min signify possible Chronic Kidney Disease.    Anion gap 6 5 - 15      Imaging Results (Last 48 hours)    Ct Head Wo Contrast  04/09/2015 CLINICAL DATA: 65 year old male with right hemisphere intra-axial hemorrhage discovered following presentation of left side paralysis. Initial encounter. EXAM: CT HEAD WITHOUT CONTRAST TECHNIQUE: Contiguous axial images were obtained from the base of the skull through the vertex without intravenous contrast. COMPARISON: 04/07/2015 through 04/04/2015, 04/02/2012 FINDINGS: Stable visualized osseous structures. Visualized paranasal sinuses and mastoids are clear. Rightward gaze deviation. No acute orbit or scalp soft tissue findings. Calcified atherosclerosis at the skull base. Basilar cisterns remain patent. No ventriculomegaly. Stable posterior fossa gray-white matter differentiation. Curvilinear intra-axial hemorrhage occupying the right  frontal lobe, epicenter anterior superior right frontal lobe re- demonstrated. Hyperdense blood products have not significantly changed in size or configuration, encompassing up to 54 x 51 mm AP by transverse (57 x 49 mm at a comparable level previously. Surrounding edema has increased since 04/05/2015. Regional mass effect has mildly increased, now with anterior midline shift of 5-6 mm (3-4 mm previously). As before, little to no extra-axial extension of hemorrhage (questionable trace subarachnoid extension near the midline on series 21, image 16). No intraventricular extension. Stable left hemisphere gray-white matter differentiation. IMPRESSION: 1. No significant change in large right frontal lobe intra-axial hemorrhage since 04/07/2015. Estimated hematoma volume 83 mL. 2. Mildly increased surrounding edema and regional mass effect. Leftward anterior midline shift now 6 mm, previously 3-4  mm. No ventriculomegaly and basilar cisterns remain patent. 3. Little to no extra-axial extension of blood (questionable trace subarachnoid extension on image 16 today). 4. No new intracranial abnormality. Electronically Signed By: Genevie Ann M.D. On: 04/09/2015 13:27        Medical Problem List and Plan: 1. Left hemiparesis, gait disorder and dysphargia secondary to Right frontal hypertensive ICH 2. DVT Prophylaxis/Anticoagulation: Pharmaceutical: Heparin, prophyllactic dose, local care for RUE DVT 3. Pain Management: Tylenol prn for pain.  4. Mood: LCSW to follow for evaluation and support.  5. Neuropsych: This patient is capable of making decisions on his own behalf. 6. Skin/Wound Care: Routine pressure relief measures 7. Fluids/Electrolytes/Nutrition: Monitor I/O. Offer supplements between meals as intake poor. Check lytes in am. 8. Malignant HTN: Monitor BP every 6 hours as DBP ranging 90- 120. Continue coreg bid and cozaar 9. CAD: Monitor for symptoms with increase in activity level. Continue coreg and cozaar. ASA/Plavix discontinued. Lipitor on hold due to bleed.  10. ABLA: Monitor for now. Check CBC in am. 11 Hypokalemia: Likely due to IVF. Will supplement today and check lytes in am.       Post Admission Physician Evaluation: 1. Functional deficits secondary to Left hemiparesis, gait disorder and dysphargia secondary to Right frontal hypertensive ICH. 2. Patient is admitted to receive collaborative, interdisciplinary care between the physiatrist, rehab nursing staff, and therapy team. 3. Patient's level of medical complexity and substantial therapy needs in context of that medical necessity cannot be provided at a lesser intensity of care such as a SNF. 4. Patient has experienced substantial functional loss from his/her baseline which was documented above under the "Functional History" and "Functional Status" headings. Judging by the patient's diagnosis, physical exam,  and functional history, the patient has potential for functional progress which will result in measurable gains while on inpatient rehab. These gains will be of substantial and practical use upon discharge in facilitating mobility and self-care at the household level. 5. Physiatrist will provide 24 hour management of medical needs as well as oversight of the therapy plan/treatment and provide guidance as appropriate regarding the interaction of the two. 6. 24 hour rehab nursing will assist with bladder management, bowel management, safety, skin/wound care, disease management, medication administration, pain management and patient education and help integrate therapy concepts, techniques,education, etc. 7. PT will assess and treat for/with: pre gait, gait training, endurance , safety, equipment, neuromuscular re education. Goals are: Sup. 8. OT will assess and treat for/with: ADLs, Cognitive perceptual skills, Neuromuscular re education, safety, endurance, equipment. Goals are: Mod I/Sup. Therapy may proceed with showering this patient. 9. SLP will assess and treat for/with: cognition and swallowing. Goals are: Safe po intake. 10. Case Management and Social Worker will assess and  treat for psychological issues and discharge planning. 11. Team conference will be held weekly to assess progress toward goals and to determine barriers to discharge. 12. Patient will receive at least 3 hours of therapy per day at least 5 days per week. 13. ELOS: 10-14days  14. Prognosis: excellent     Charlett Blake M.D. [facility] FAAPM&R (Sports Med, Neuromuscular Med) Diplomate [place] Board of Electrodiagnostic Med  04/10/2015

## 2015-04-10 NOTE — Progress Notes (Signed)
We await RUE doppler prior to admission to inpt rehab today per Dr. Wynn Banker. I will discuss with RN. [phone]

## 2015-04-10 NOTE — Progress Notes (Signed)
Received pt. As transfer from 3 M.Pt. Was oriented to the unit protocol,safety plan was explained,fall prevention plan was explained and sign.Keep monitoring pt.closely and assessing his needs.

## 2015-04-10 NOTE — Progress Notes (Signed)
Physical Medicine and Rehabilitation Consult Reason for Consult: Right frontoparietal ICH with cerebral edema in setting of hypertension Referring Physician: Dr.Xu   HPI: Randall Patel is a 64 y.o. 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 Patient lives with sister and one level home for steps to entry. Reported to be independent prior to admission and driving, living with sister who attends school during the day. Pt will not have 24/7 support on discharge. Presented 04/04/2015 with headache and blood pressure 189/128. Denies any recent trauma. Cranial CT scan showed a large right frontoparietal intracerebral hemorrhage. Neurosurgery Dr. Marikay Alar consulted and advised conservative care. Placed on Cardene drip for blood pressure control. Aspirin and Plavix discontinued secondary to ICH. Follow-up cranial CT scan 04/07/2015 with evolving right frontal intraparenchymal hematoma right to left midline shift without ventricular entrapment. Echocardiogram with ejection fraction of 45% grade 1 diastolic dysfunction Neurology consulted for follow-up. Later placed on subcutaneous heparin for DVT prophylaxis 04/07/2015. Failed modified barium swallow dysphagia 1 pudding thick liquids. Physical and occupational therapy evaluation completed 04/07/2015 with recommendations of physical medicine rehabilitation consult.  Review of Systems  Constitutional: Negative for fever and chills.  HENT: Negative for hearing loss.  Eyes: Negative for blurred vision and double vision.  Respiratory: Positive for cough. Negative for shortness of breath.  Cardiovascular: Positive for leg swelling. Negative for chest pain.  Gastrointestinal: Positive for constipation. Negative for nausea and vomiting.  Genitourinary: Negative for dysuria and hematuria.  Musculoskeletal: Positive for myalgias and joint pain.  Neurological:  Positive for dizziness, focal weakness and headaches. Negative for seizures.   Left side weakness from history of CVA  All other systems reviewed and are negative.  Past Medical History  Diagnosis Date  . Hypertension   . Coronary artery disease     MI in 2008 with PCI to ? vessel; NSTEMI 03/2012; NSTEMI 04/2012 Cath showed triple vessel dz including occluded RCA, s/p DES to distal RCA, PTCA posterolateral branch   . CVA (cerebral infarction)     03/2012 with left arm weakness/numbness and left facial droop.  . Ischemic cardiomyopathy     04/2012 EF 35-40%  . Hyperlipidemia   . Claudication (HCC)   . Tobacco abuse     quit 04/06/12  . ETOH abuse     quit 04/06/12   Past Surgical History  Procedure Laterality Date  . Hernia repair    . Left heart catheterization with coronary angiogram N/A 04/11/2012    Procedure: LEFT HEART CATHETERIZATION WITH CORONARY ANGIOGRAM; Surgeon: Kathleene Hazel, MD; Location: [facility] CATH LAB; Service: Cardiovascular; Laterality: N/A;  . Percutaneous coronary stent intervention (pci-s)  04/11/2012    Procedure: PERCUTANEOUS CORONARY STENT INTERVENTION (PCI-S); Surgeon: Kathleene Hazel, MD; Location: [facility] CATH LAB; Service: Cardiovascular;; Distal RCA  . Percutaneous coronary intervention-balloon only  04/11/2012    Procedure: PERCUTANEOUS CORONARY INTERVENTION-BALLOON ONLY; Surgeon: Kathleene Hazel, MD; Location: [facility] CATH LAB; Service: Cardiovascular;; RPLA   Family History  Problem Relation Age of Onset  . Cancer Mother   . Heart attack Father   . Hypertension Mother   . Hypertension Father   . Hypertension Sister   . Hypertension Brother   . Stroke Sister    Social History:  reports that he has been smoking Cigarettes. He has a 40 pack-year smoking history. He has never used smokeless tobacco. He reports that he  drinks alcohol. He reports that he does not  use illicit drugs. Allergies:  Allergies  Allergen Reactions  . Lisinopril Swelling and Other (See Comments)    angioedema   Medications Prior to Admission  Medication Sig Dispense Refill  . aspirin 81 MG chewable tablet Chew 1 tablet (81 mg total) by mouth daily.    [name] atorvastatin (LIPITOR) 80 MG tablet Take 1 tablet (80 mg total) by mouth daily with breakfast. 90 tablet 3  . carvedilol (COREG) 6.25 MG tablet Take 1 tablet (6.25 mg total) by mouth 2 (two) times daily with a meal. (Patient taking differently: Take 6.25 mg by mouth daily. ) 180 tablet 3  . clopidogrel (PLAVIX) 75 MG tablet Take 1 tablet (75 mg total) by mouth daily. 90 tablet 3  . furosemide (LASIX) 20 MG tablet Take 1 tablet (20 mg total) by mouth daily. 90 tablet 3  . losartan (COZAAR) 25 MG tablet Take 1 tablet (25 mg total) by mouth daily. 90 tablet 3  . meloxicam (MOBIC) 7.5 MG tablet Take 15 mg by mouth daily.     . nitroGLYCERIN (NITROSTAT) 0.4 MG SL tablet Place 1 tablet (0.4 mg total) under the tongue every 5 (five) minutes as needed for chest pain (up to 3 doses). 25 tablet 3    Home: Home Living Family/patient expects to be discharged to:: Private residence Living Arrangements: Other relatives (sister) Available Help at Discharge: Family, Available PRN/intermittently, Friend(s) Type of Home: House Home Access: Stairs to enter Secretary/administrator of Steps: 4 Entrance Stairs-Rails: Left Home Layout: One level Bathroom Shower/Tub: Engineer, manufacturing systems: Standard Home Equipment: None Additional Comments: drives  Functional History: Prior Function Level of Independence: Independent Comments: drives Functional Status:  Mobility: Bed Mobility Overal bed mobility: Needs Assistance Bed Mobility: Rolling, Supine to Sit Rolling: Max assist Supine to sit: Min assist, +2 for physical assistance,  HOB elevated General bed mobility comments: pt needs increased A with rolling to R side, but participates well with R UE to bring self up to sitting. pt with posterior lean initially in sitting and with cueing is able to correct.  Transfers Overall transfer level: Needs assistance Equipment used: 2 person hand held assist Transfers: Sit to/from Stand, Stand Pivot Transfers Sit to Stand: Mod assist, +2 physical assistance Stand pivot transfers: Min assist, +2 physical assistance General transfer comment: pt leans to L side with coming to standing. cues for balance and step-by-step through pivot to recliner. pt able to independently move L LE without A.       ADL: ADL Overall ADL's : Needs assistance/impaired Eating/Feeding: Minimal assistance, Sitting Grooming: Wash/dry face, Sitting, Minimal assistance Upper Body Bathing: Sitting, Maximal assistance Lower Body Bathing: Sit to/from stand, Maximal assistance, +2 for physical assistance Upper Body Dressing : Maximal assistance, Sitting Lower Body Dressing: +2 for physical assistance, Minimal assistance, Sit to/from stand Toilet Transfer: +2 for physical assistance, Minimal assistance, Stand-pivot, BSC Toileting- Clothing Manipulation and Hygiene: +2 for safety/equipment, Maximal assistance, Sit to/from stand  Cognition: Cognition Overall Cognitive Status: Impaired/Different from baseline Arousal/Alertness: Awake/alert Orientation Level: Oriented X4 Attention: Sustained Sustained Attention: Impaired Sustained Attention Impairment: Functional basic Memory: Appears intact Awareness: Impaired Awareness Impairment: Intellectual impairment Problem Solving: Impaired Problem Solving Impairment: Verbal complex, Functional complex Executive Function: Reasoning Reasoning: Impaired Reasoning Impairment: Verbal complex, Functional complex Behaviors: (flat affect) Cognition Arousal/Alertness: Awake/alert Behavior During Therapy: Flat  affect Overall Cognitive Status: Impaired/Different from baseline Area of Impairment: Attention, Safety/judgement, Awareness, Problem solving Current Attention Level: Selective Safety/Judgement: Decreased awareness of safety, Decreased awareness of deficits Awareness:  Emergent Problem Solving: Slow processing, Decreased initiation, Difficulty sequencing, Requires verbal cues, Requires tactile cues General Comments: pt with L sided neglect and when cueing pt to attend to L side, pt states "I can see her." pt able to state deficits, but poor awareness of implications.   Blood pressure 164/101, pulse 95, temperature 99.5 F (37.5 C), temperature source Oral, resp. rate 26, height 5\' 5"  (1.651 m), weight 71.1 kg (156 lb 12 oz), SpO2 99 %. Physical Exam  Vitals reviewed. Constitutional: He is oriented to person, place, and time. He appears well-developed and well-nourished.  HENT:  Head: Normocephalic and atraumatic.  Eyes: Conjunctivae and EOM are normal.  Pupils sluggish to light Injected sclera  Neck: Normal range of motion. Neck supple. No thyromegaly present.  Cardiovascular: Normal rate and regular rhythm.  Respiratory: Effort normal and breath sounds normal. No respiratory distress.  GI: Soft. Bowel sounds are normal. He exhibits no distension.  Musculoskeletal: He exhibits no edema or tenderness.  Neurological: He is alert and oriented to person, place, and time. He has normal reflexes.  Right gaze preference.  Speech is mildly dysarthric but intelligible.  Follows simple commands.  Fair awareness of deficits. Left facial weakness Sensation intact to light touch Motor: B/l UE, RLE: 5/5 proximal to distal LUE: 0/5  Skin: Skin is warm and dry.  Psychiatric: He has a normal mood and affect. His behavior is normal.     Lab Results Last 24 Hours    Results for orders placed or performed during the hospital encounter of 04/04/15 (from the past 24 hour(s))  Sodium  Status: None   Collection Time: 04/07/15 5:59 AM  Result Value Ref Range   Sodium 143 135 - 145 mmol/L  Osmolality Status: None   Collection Time: 04/07/15 8:05 AM  Result Value Ref Range   Osmolality 293 275 - 295 mOsm/kg  CBC Status: None   Collection Time: 04/07/15 8:05 AM  Result Value Ref Range   WBC 7.9 4.0 - 10.5 K/uL   RBC 5.00 4.22 - 5.81 MIL/uL   Hemoglobin 14.0 13.0 - 17.0 g/dL   HCT 16.1 09.6 - 04.5 %   MCV 89.0 78.0 - 100.0 fL   MCH 28.0 26.0 - 34.0 pg   MCHC 31.5 30.0 - 36.0 g/dL   RDW 40.9 81.1 - 91.4 %   Platelets 156 150 - 400 K/uL  Basic metabolic panel Status: Abnormal   Collection Time: 04/07/15 8:05 AM  Result Value Ref Range   Sodium 143 135 - 145 mmol/L   Potassium 3.4 (L) 3.5 - 5.1 mmol/L   Chloride 109 101 - 111 mmol/L   CO2 22 22 - 32 mmol/L   Glucose, Bld 117 (H) 65 - 99 mg/dL   BUN <5 (L) 6 - 20 mg/dL   Creatinine, Ser 7.82 0.61 - 1.24 mg/dL   Calcium 9.7 8.9 - 95.6 mg/dL   GFR calc non Af Amer >60 >60 mL/min   GFR calc Af Amer >60 >60 mL/min   Anion gap 12 5 - 15  HIV antibody Status: None   Collection Time: 04/07/15 8:05 AM  Result Value Ref Range   HIV Screen 4th Generation wRfx Non Reactive Non Reactive  RPR Status: None   Collection Time: 04/07/15 8:05 AM  Result Value Ref Range   RPR Ser Ql Non Reactive Non Reactive  Vitamin B12 Status: None   Collection Time: 04/07/15 8:05 AM  Result Value Ref Range   Vitamin B-12 491 180 - 914  pg/mL  Sodium Status: None   Collection Time: 04/07/15 11:25 AM  Result Value Ref Range   Sodium 143 135 - 145 mmol/L  Sodium Status: None   Collection Time: 04/07/15 7:00 PM  Result Value Ref Range   Sodium 143 135 - 145 mmol/L  Sodium Status: None   Collection Time: 04/08/15 12:00 AM  Result  Value Ref Range   Sodium 144 135 - 145 mmol/L  CBC Status: Abnormal   Collection Time: 04/08/15 5:35 AM  Result Value Ref Range   WBC 8.1 4.0 - 10.5 K/uL   RBC 4.63 4.22 - 5.81 MIL/uL   Hemoglobin 13.0 13.0 - 17.0 g/dL   HCT 16.1 09.6 - 04.5 %   MCV 87.9 78.0 - 100.0 fL   MCH 28.1 26.0 - 34.0 pg   MCHC 31.9 30.0 - 36.0 g/dL   RDW 40.9 81.1 - 91.4 %   Platelets 141 (L) 150 - 400 K/uL      Imaging Results (Last 48 hours)    Ct Head Wo Contrast  04/07/2015 CLINICAL DATA: Follow-up, altered mental status. History of hypertension, alcohol abuse, hemiplegia. EXAM: CT HEAD WITHOUT CONTRAST TECHNIQUE: Contiguous axial images were obtained from the base of the skull through the vertex without intravenous contrast. COMPARISON: CT head April 05, 2015 FINDINGS: Evolving RIGHT frontal 5.7 cm x 4.4 cm intraparenchymal hematoma with surrounding low-density vasogenic edema. Adjacent encephalomalacia. 3 mm RIGHT to LEFT midline shift is unchanged. Mild RIGHT uncal herniation. Partial effacement RIGHT lateral ventricle without entrapment or hydrocephalus. No acute large vascular territory infarct. Basal cisterns are patent. Old LEFT medial orbital blowout fracture. Imaged paranasal sinuses and mastoid air cells are well aerated. Ocular globes and orbital contents are unremarkable. No skull fracture. IMPRESSION: Evolving large RIGHT frontal intraparenchymal hematoma. Similar 3 mm RIGHT to LEFT midline shift without ventricular entrapment. Electronically Signed By: Awilda Metro M.D. On: 04/07/2015 04:58   Dg Chest Port 1 View  04/07/2015 CLINICAL DATA: Acute right intracranial hemorrhage, central line placement EXAM: PORTABLE CHEST 1 VIEW COMPARISON: 04/11/2012 FINDINGS: Right subclavian central line tip lower SVC level. Mild cardiomegaly with central vascular congestion. No focal pneumonia, collapse or consolidation. No edema, effusion or  pneumothorax. Trachea midline. IMPRESSION: Right subclavian central line tip lower SVC level Cardiomegaly without CHF or pneumonia No pneumothorax or effusion Electronically Signed By: Judie Petit. Shick M.D. On: 04/07/2015 11:35     Assessment/Plan: Diagnosis: Right frontoparietal ICH Labs and images independently reviewed. Records reviewed and summated above. Stroke: Continue secondary stroke prophylaxis and Risk Factor Modification listed below: Blood Pressure Management: Continue current medication with prn's with permisive HTN per primary team Tobacco abuse: Cont to counsel Left sided hemiparesis: fit for orthotics to prevent contractures (resting hand splint for day, wrist cock up splint at night, etc) Motor recovery: Fluoxetine  1. Does the need for close, 24 hr/day medical supervision in concert with the patient's rehab needs make it unreasonable for this patient to be served in a less intensive setting? Yes 2. 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 Due to safety, disease management, medication administration and patient education, does the  patient require 24 hr/day rehab nursing? Yes 4. Does the patient require coordinated care of a physician, rehab nurse, PT (1-2 hrs/day, 5 days/week), OT (1-2 hrs/day, 5 days/week) and SLP (1-2 hrs/day, 5 days/week) to address physical and functional deficits in the context of the above medical diagnosis(es)? Yes Addressing deficits in  the following areas: balance, endurance, locomotion, strength, transferring, bowel/bladder control, bathing, dressing, feeding, grooming, toileting, speech, swallowing and psychosocial support 5. Can the patient actively participate in an intensive therapy program of at least 3 hrs of therapy per day at least 5 days per week? Yes 6. The potential for patient to make measurable gains while on inpatient rehab is excellent 7. Anticipated functional outcomes upon discharge from inpatient rehab are supervision and min assist with PT, supervision and min assist with OT, supervision and min assist with SLP. 8. Estimated rehab length of stay to reach the above functional goals is: 20-24 days. 9. Does the patient have adequate social supports and living environment to accommodate these discharge functional goals? No 10. Anticipated D/C setting: Other 11. Anticipated post D/C treatments: Other 12. Overall Rehab/Functional Prognosis: good  RECOMMENDATIONS: This patient's condition is appropriate for continued rehabilitative care in the following setting: Pt does not have 24/7 support on discharge and will not be able to achive an independent level of functioning after a short IRF stay. If family is able to provide support, would recommend CIR, otherwise SNF. Patient has agreed to participate in recommended program. Yes Note that insurance prior authorization may be required for reimbursement for recommended care.  Comment: Rehab Admissions Coordinator to follow up.  Maryla Morrow, MD 04/08/2015       Revision History     Date/Time User Provider Type Action   04/08/2015 2:01 PM Ankit Karis Juba, MD Physician Sign   04/08/2015 6:33 AM Charlton Amor, PA-C Physician Assistant Pend   View Details Report       Routing History     Date/Time From To Method   04/08/2015 2:01 PM Ankit Karis Juba, MD Ankit Karis Juba, MD In [facility]   04/08/2015 2:01 PM Ankit Karis Juba, MD [name], MD Fax

## 2015-04-10 NOTE — Care Management Note (Signed)
Case Management Note  Patient Details  Name: Sante Nagle MRN: [id number] Date of Birth: 10/02/1950  Subjective/Objective:  Pt has been accepted for admission to [facility] IP rehab today, pending results of RUE dopplers.                    Action/Plan: Will dc to [facility] later today.    Expected Discharge Date:    04/10/2015              Expected Discharge Plan:  IP Rehab Facility  In-House Referral:     Discharge planning Services  CM Consult  Post Acute Care Choice:    Choice offered to:     DME Arranged:    DME Agency:     HH Arranged:    HH Agency:     Status of Service:    Completed Medicare Important Message Given:  Yes Date Medicare IM Given:    Medicare IM give by:    Date Additional Medicare IM Given:    Additional Medicare Important Message give by:     If discussed at Long Length of Stay Meetings, dates discussed:    Additional Comments:  Quintella Baton, RN, BSN  Trauma/Neuro ICU Case Manager [phone]

## 2015-04-10 NOTE — PMR Pre-admission (Signed)
PMR Admission Coordinator Pre-Admission Assessment  Patient: Randall Patel is an 64 y.o., male MRN: [id number] DOB: 1950/09/05 Height: 5\' 5"  (165.1 cm) Weight: 71.1 kg (156 lb 12 oz)              Insurance Information HMO:      PPO:    PCP:      IPA:      80/20: yes     OTHER: no HMO PRIMARY: Medicare a and b      Policy#: [id number]      Subscriber: pt Benefits:  Phone #: passport one     Name: 04/10/2015 Eff. Date: 10/10/2009     Deduct: $1288      Out of Pocket Max: none      Life Max: none CIR: 100%      SNF: 20 full days Outpatient: 80%     Co-Pay: 20% Home Health: 100%      Co-Pay: none DME: 80%     Co-Pay: 20% Providers: pt choice  SECONDARY: Medicaid of [place]      Policy#: [id number]      Subscriber: pt  Medicaid Application Date:       Case Manager:  Disability Application Date:       Case Worker:   Emergency Contact Information Contact Information    Name Relation Home Work Mobile   [place] Sister [phone]     Avin, Gibbons   [phone]     Current Medical History  Patient Admitting Diagnosis: right frontoparietal ICH  History of Present Illness: Randall Patel is a 64 y.o. 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  Presented 04/04/2015 with headache and blood pressure 189/128. Denies any recent trauma. Cranial CT scan showed a large right frontoparietal intracerebral hemorrhage. Neurosurgery Dr. Marikay Alar consulted and advised conservative care. Placed on Cardene drip for blood pressure control. Aspirin and Plavix discontinued secondary to ICH. Follow-up cranial CT scan 04/07/2015 with evolving right frontal intraparenchymal hematoma right to left midline shift without ventricular entrapment. Maintain on 3% normal saline later discontinued latest sodium 148. And discontinued 3 % on 04/09/15. Echocardiogram with ejection fraction of 45% grade 1 diastolic  dysfunction. Consider outpt 30 day cardiac event monitoring to look for embolic source. Neurology consulted for follow-up. Later placed on subcutaneous heparin for DVT prophylaxis 04/07/2015. Failed modified barium swallow dysphagia 1 nectar thick liquids. On 12/28 noted pt's RUE with redness and swelling at [facility] region. Warm to touch, swollen, and hard to touch. Dopplers pending this a. m. 04/10/15 but found to be negative for dvt of the right arm at 1310.   Total: 8 NIH    Past Medical History  Past Medical History  Diagnosis Date  . Hypertension   . Coronary artery disease     MI in 2008 with PCI to ? vessel; NSTEMI 03/2012; NSTEMI 04/2012 Cath showed triple vessel dz including occluded RCA, s/p DES to distal RCA, PTCA posterolateral branch   . CVA (cerebral infarction)     03/2012 with left arm weakness/numbness and left facial droop.  . Ischemic cardiomyopathy     04/2012 EF 35-40%  . Hyperlipidemia   . Claudication (HCC)   . Tobacco abuse     quit 04/06/12  . ETOH abuse     quit 04/06/12    Family History  family history includes Cancer in his mother; Heart attack in his father; Hypertension in his brother,  father, mother, and sister; Stroke in his sister.  Prior Rehab/Hospitalizations:  Has the patient had major surgery during 100 days prior to admission? No  Current Medications   Current facility-administered medications:  .  0.9 %  sodium chloride infusion, , Intravenous, Continuous, Marvel Plan, MD, Last Rate: 30 mL/hr at 04/09/15 1726 .  acetaminophen (TYLENOL) tablet 650 mg, 650 mg, Oral, Q4H PRN, 650 mg at 04/09/15 2057 **OR** acetaminophen (TYLENOL) suppository 650 mg, 650 mg, Rectal, Q4H PRN, Lenise Herald, MD .  antiseptic oral rinse (CPC / CETYLPYRIDINIUM CHLORIDE 0.05%) solution 7 mL, 7 mL, Mouth Rinse, BID, Micki Riley, MD, 7 mL at 04/09/15 2124 .  carvedilol (COREG) tablet 6.25 mg, 6.25 mg, Oral, BID WC, Layne Benton, NP, 6.25 mg at 04/10/15 0802 .   feeding supplement (ENSURE ENLIVE) (ENSURE ENLIVE) liquid 237 mL, 237 mL, Oral, TID BM, Marvel Plan, MD, 237 mL at 04/09/15 2047 .  heparin injection 5,000 Units, 5,000 Units, Subcutaneous, 3 times per day, Marvel Plan, MD, 5,000 Units at 04/10/15 0729 .  labetalol (NORMODYNE,TRANDATE) injection 10-40 mg, 10-40 mg, Intravenous, Q10 min PRN, Lenise Herald, MD, 20 mg at 04/09/15 2135 .  losartan (COZAAR) tablet 100 mg, 100 mg, Oral, Daily, Marvel Plan, MD, 100 mg at 04/09/15 0951 .  nicardipine (CARDENE)  in 0.86% saline IV infusion (0.1 mg/ml), 3-15 mg/hr, Intravenous, Continuous, Ramond Marrow, DO, Stopped at 04/09/15 0045 .  pantoprazole (PROTONIX) EC tablet 40 mg, 40 mg, Oral, Daily, Darl Householder Masters, RPH, 40 mg at 04/09/15 2057 .  senna-docusate (Senokot-S) tablet 1 tablet, 1 tablet, Oral, BID, Lenise Herald, MD, 1 tablet at 04/09/15 [id number] .  sodium chloride (hypertonic) 3 % solution, , Intravenous, Continuous, Rejeana Brock, MD, Stopped at 04/09/15 2140 .  sodium chloride 0.9 % injection 3 mL, 3 mL, Intravenous, Q12H, Ramond Marrow, DO, 3 mL at 04/09/15 2126  Patients Current Diet: DIET - DYS 1 Room service appropriate?: Yes; Fluid consistency:: Nectar Thick  Precautions / Restrictions Precautions Precautions: Fall Restrictions Weight Bearing Restrictions: No   Has the patient had 2 or more falls or a fall with injury in the past year?Yes  Prior Activity Level Community (5-7x/wk): Independent and driving pta. Disabled after an MI 2010.  Home Assistive Devices / Equipment Home Assistive Devices/Equipment: None Home Equipment: None  Prior Device Use: Indicate devices/aids used by the patient prior to current illness, exacerbation or injury? None of the above  Prior Functional Level Prior Function Level of Independence: Independent Comments: drives  Self Care: Did the patient need help bathing, dressing, using the toilet or eating?  Independent  Indoor  Mobility: Did the patient need assistance with walking from room to room (with or without device)? Independent  Stairs: Did the patient need assistance with internal or external stairs (with or without device)? Independent  Functional Cognition: Did the patient need help planning regular tasks such as shopping or remembering to take medications? Independent  Current Functional Level Cognition  Arousal/Alertness: Awake/alert Overall Cognitive Status: Impaired/Different from baseline Current Attention Level: Selective Orientation Level: Oriented X4 Safety/Judgement: Decreased awareness of safety, Decreased awareness of deficits General Comments: pt continues with L sided neglect and needing cues for attending to L side. Attention: Sustained Sustained Attention: Impaired Sustained Attention Impairment: Functional basic Memory: Appears intact Awareness: Impaired Awareness Impairment: Intellectual impairment Problem Solving: Impaired Problem Solving Impairment: Verbal complex, Functional complex Executive Function: Reasoning Reasoning: Impaired Reasoning Impairment: Verbal complex, Functional complex Behaviors:  (flat affect)  Extremity Assessment (includes Sensation/Coordination)  Upper Extremity Assessment: LUE deficits/detail LUE Deficits / Details: no active movement LUE Sensation: decreased proprioception (neglect) LUE Coordination: decreased fine motor, decreased gross motor  Lower Extremity Assessment: Defer to PT evaluation LLE Deficits / Details: Strength grossly 2/5 with intact sensation.   LLE Coordination: decreased fine motor, decreased gross motor    ADLs  Overall ADL's : Needs assistance/impaired Eating/Feeding: Minimal assistance, Sitting Grooming: Wash/dry face, Sitting, Minimal assistance Upper Body Bathing: Sitting, Maximal assistance Lower Body Bathing: Sit to/from stand, Maximal assistance, +2 for physical assistance Upper Body Dressing : Maximal  assistance, Sitting Lower Body Dressing: +2 for physical assistance, Minimal assistance, Sit to/from stand Toilet Transfer: +2 for physical assistance, Minimal assistance, Stand-pivot, BSC Toileting- Clothing Manipulation and Hygiene: +2 for safety/equipment, Maximal assistance, Sit to/from stand    Mobility  Overal bed mobility: Needs Assistance Bed Mobility: Supine to Sit Rolling: Max assist Supine to sit: Min assist General bed mobility comments: With increased time pt able to complete mroe of bed mobility.  A needed for scooting L hip closer to EOB.      Transfers  Overall transfer level: Needs assistance Equipment used: 1 person hand held assist Transfers: Sit to/from Stand, Stand Pivot Transfers Sit to Stand: Mod assist, +2 safety/equipment Stand pivot transfers: Mod assist, +2 safety/equipment General transfer comment: pt able to complete transfer with one person A, but 2nd person present for line management and A with hygiene after using commode.      Ambulation / Gait / Stairs / Wheelchair Mobility  Ambulation/Gait Ambulation/Gait assistance: Mod assist, +2 safety/equipment Ambulation Distance (Feet): 5 Feet Assistive device: 1 person hand held assist Gait Pattern/deviations: Step-to pattern, Decreased step length - left, Decreased stance time - left, Decreased dorsiflexion - left General Gait Details: pt needs cues for correcting L lateral lean and extending L LE in stance.  2nd person present to manage lines and chairs.      Posture / Balance Balance Overall balance assessment: Needs assistance Sitting-balance support: No upper extremity supported, Feet supported Sitting balance-Leahy Scale: Fair Postural control: Left lateral lean Standing balance support: During functional activity Standing balance-Leahy Scale: Poor Standing balance comment: Worked on standing in front of mirror attempting to give pt visual input to L lean and R visual bias.  pt needed cues to get all  the way in front of mirror as he was leaving L side out of mirror.      Special needs/care consideration Skin intact                               Bowel mgmt:LBM 04/09/15 Bladder mgmt: incontinent Diabetic mgmt Hgb A1c 6.4 Alcohol abuse pta Smoker   Previous Home Environment Living Arrangements: Other (Comment) (Lives with his sister [name], [place] GF across the street, s)  Lives With: Family, Other (Comment) (sister, [name]) Available Help at Discharge: Family, Available 24 hours/day, Other (Comment) (sister in school to get GED, plans to take leave to provide ) Type of Home: House Home Layout: One level Home Access: Stairs to enter Entrance Stairs-Rails: Right, Left, Can reach both Entrance Stairs-Number of Steps: 4 Bathroom Shower/Tub: Tub/shower unit, Engineer, building services: Standard Bathroom Accessibility: Yes How Accessible: Accessible via walker Home Care Services: No Additional Comments: drives, was drinking 3 to 6 beers daily and smokes pack cigs every two days  Discharge Living Setting Plans for Discharge Living Setting: Lives with (comment), Other (  Comment) (sister, [name] Chance) Type of Home at Discharge: House Discharge Home Layout: One level Discharge Home Access: Stairs to enter Entrance Stairs-Rails: Right, Left, Can reach both Entrance Stairs-Number of Steps: 4 Discharge Bathroom Shower/Tub: Tub/shower unit, Curtain Discharge Bathroom Toilet: Standard Discharge Bathroom Accessibility: Yes How Accessible: Accessible via walker Does the patient have any problems obtaining your medications?: No  Social/Family/Support Systems Patient Roles: Parent, Other (Comment) (Has one son, Claudy Soto) Contact Information: [name] Chance sister is main contact Anticipated Caregiver: sister and brother, Fayrene Fearing Anticipated Industrial/product designer Information: see above Ability/Limitations of Caregiver: none Caregiver Availability: 24/7 Discharge Plan Discussed  with Primary Caregiver: Yes Is Caregiver In Agreement with Plan?: Yes Does Caregiver/Family have Issues with Lodging/Transportation while Pt is in Rehab?: No   Goals/Additional Needs Patient/Family Goal for Rehab: supervision to min assist with PT, OT, and SLP Expected length of stay: ELOS 14 - 20 days Pt/Family Agrees to Admission and willing to participate: Yes Program Orientation Provided & Reviewed with Pt/Caregiver Including Roles  & Responsibilities: Yes  Decrease burden of Care through IP rehab admission: n/a  Possible need for SNF placement upon discharge:not anticipated  Patient Condition: This patient's medical and functional status has changed since the consult dated: 04/08/2015 in which the Rehabilitation Physician determined and documented that the patient's condition is appropriate for intensive rehabilitative care in an inpatient rehabilitation facility. See "History of Present Illness" (above) for medical update. Functional changes are: moderate assist. Patient's medical and functional status update has been discussed with the Rehabilitation physician and patient remains appropriate for inpatient rehabilitation. Will admit to inpatient rehab today.  Preadmission Screen Completed By:  Clois Dupes, 04/10/2015 9:15 AM ______________________________________________________________________   Discussed status with Dr. Wynn Banker on 04/10/15 at [phone] and received telephone approval for admission today.  Admission Coordinator:  Clois Dupes, time 3643 Date 04/10/2015.

## 2015-04-10 NOTE — Progress Notes (Signed)
Suggesting no evidence DVT in the axillary Vein of the right arm.

## 2015-04-10 NOTE — Progress Notes (Signed)
No Deep vein thrombus in the right arm. Identified superificial acute thrombus in cephalic vein.

## 2015-04-10 NOTE — Discharge Summary (Addendum)
Stroke Discharge Summary  Patient ID: Randall Patel   MRN: [id number]      DOB: Aug 05, 1950  Date of Admission: 04/04/2015 Date of Discharge: 04/10/2015  Attending Physician:  Marvel Plan, MD, Stroke MD  Consulting Physician(s):    Maryla Morrow, MD (Physical Medicine & Rehabtilitation)   Patient's PCP:  [name], MD  Discharge Diagnoses:  Stroke: Large acute right frontoparietal ICH with cerebral edema in setting of hypertension.  ResultantLeft hemiparesis, dysarthria, dysphagia Cerebral edema History of CVA with residual deficit RUE SVT Dysphagia Hypertensive Urgency Hyperlipidemia Tobacco abuse Family hx stroke (sister) Coronary artery disease s/p stenting on DAPT prior to admission Dental Caried Thrombocytopenia (HCC) Diastolic dysfunction ETOH abuse Induced Hypernatremia Hypokalemia   Past Medical History  Diagnosis Date  . Hypertension   . Coronary artery disease     MI in 2008 with PCI to ? vessel; NSTEMI 03/2012; NSTEMI 04/2012 Cath showed triple vessel dz including occluded RCA, s/p DES to distal RCA, PTCA posterolateral branch   . CVA (cerebral infarction)     03/2012 with left arm weakness/numbness and left facial droop.  . Ischemic cardiomyopathy     04/2012 EF 35-40%  . Hyperlipidemia   . Claudication (HCC)   . Tobacco abuse     quit 04/06/12  . ETOH abuse     quit 04/06/12   Past Surgical History  Procedure Laterality Date  . Hernia repair    . Left heart catheterization with coronary angiogram N/A 04/11/2012    Procedure: LEFT HEART CATHETERIZATION WITH CORONARY ANGIOGRAM;  Surgeon: Kathleene Hazel, MD;  Location: [facility] CATH LAB;  Service: Cardiovascular;  Laterality: N/A;  . Percutaneous coronary stent intervention (pci-s)  04/11/2012    Procedure: PERCUTANEOUS CORONARY STENT INTERVENTION (PCI-S);  Surgeon: Kathleene Hazel, MD;  Location: [facility] CATH LAB;  Service: Cardiovascular;;  Distal RCA  . Percutaneous coronary  intervention-balloon only  04/11/2012    Procedure: PERCUTANEOUS CORONARY INTERVENTION-BALLOON ONLY;  Surgeon: Kathleene Hazel, MD;  Location: [facility] CATH LAB;  Service: Cardiovascular;;  RPLA    Medications to be continued on Rehab . antiseptic oral rinse  7 mL Mouth Rinse BID  . carvedilol  6.25 mg Oral BID WC  . feeding supplement (ENSURE ENLIVE)  237 mL Oral TID BM  . heparin subcutaneous  5,000 Units Subcutaneous 3 times per day  . losartan  100 mg Oral Daily  . pantoprazole  40 mg Oral Daily  . senna-docusate  1 tablet Oral BID  . sodium chloride  3 mL Intravenous Q12H    LABORATORY STUDIES CBC    Component Value Date/Time   WBC 5.9 04/10/2015 0656   RBC 3.96* 04/10/2015 0656   HGB 11.0* 04/10/2015 0656   HCT 34.9* 04/10/2015 0656   PLT 134* 04/10/2015 0656   MCV 88.1 04/10/2015 0656   MCH 27.8 04/10/2015 0656   MCHC 31.5 04/10/2015 0656   RDW 14.9 04/10/2015 0656   LYMPHSABS 0.7 04/04/2015 1818   MONOABS 0.3 04/04/2015 1818   EOSABS 0.0 04/04/2015 1818   BASOSABS 0.0 04/04/2015 1818   CMP    Component Value Date/Time   NA 145 04/10/2015 0656   K 3.4* 04/10/2015 0656   CL 113* 04/10/2015 0656   CO2 26 04/10/2015 0656   GLUCOSE 97 04/10/2015 0656   BUN 8 04/10/2015 0656   CREATININE 0.86 04/10/2015 0656   CALCIUM 9.1 04/10/2015 0656   PROT 7.8 04/04/2015 1818   ALBUMIN 4.3 04/04/2015 1818   AST  83* 04/04/2015 1818   ALT 122* 04/04/2015 1818   ALKPHOS 56 04/04/2015 1818   BILITOT 0.7 04/04/2015 1818   GFRNONAA >60 04/10/2015 0656   GFRAA >60 04/10/2015 0656   COAGS Lab Results  Component Value Date   INR 1.08 04/04/2015   INR 0.96 04/11/2012   INR 0.92 04/02/2012   Lipid Panel    Component Value Date/Time   CHOL 179 04/07/2015 0203   TRIG 67 04/07/2015 0203   HDL 96 04/07/2015 0203   CHOLHDL 1.9 04/07/2015 0203   VLDL 13 04/07/2015 0203   LDLCALC 70 04/07/2015 0203   HgbA1C  Lab Results  Component Value Date   HGBA1C 6.1* 04/07/2015    Cardiac Panel (last 3 results) No results for input(s): CKTOTAL, CKMB, TROPONINI, RELINDX in the last 72 hours. Urinalysis    Component Value Date/Time   COLORURINE YELLOW 04/04/2015 1949   APPEARANCEUR CLEAR 04/04/2015 1949   LABSPEC 1.010 04/04/2015 1949   PHURINE 7.5 04/04/2015 1949   GLUCOSEU NEGATIVE 04/04/2015 1949   HGBUR NEGATIVE 04/04/2015 1949   BILIRUBINUR NEGATIVE 04/04/2015 1949   KETONESUR 15* 04/04/2015 1949   PROTEINUR NEGATIVE 04/04/2015 1949   NITRITE NEGATIVE 04/04/2015 1949   LEUKOCYTESUR NEGATIVE 04/04/2015 1949   Urine Drug Screen     Component Value Date/Time   LABOPIA NONE DETECTED 04/04/2015 1755   COCAINSCRNUR NONE DETECTED 04/04/2015 1755   LABBENZ NONE DETECTED 04/04/2015 1755   AMPHETMU NONE DETECTED 04/04/2015 1755   THCU POSITIVE* 04/04/2015 1755   LABBARB NONE DETECTED 04/04/2015 1755    Alcohol Level    Component Value Date/Time   ETH <5 04/04/2015 1818     SIGNIFICANT DIAGNOSTIC STUDIES  CT HEAD 04/09/2015 1. No significant change in large right frontal lobe intra-axial hemorrhage since 04/07/2015. Estimated hematoma volume 83 mL. 2. Mildly increased surrounding edema and regional mass effect. Leftward anterior midline shift now 6 mm, previously 3-4 mm. No ventriculomegaly and basilar cisterns remain patent. 3. Little to no extra-axial extension of blood (questionable trace subarachnoid extension on image 16 today). 4. No new intracranial abnormality. 04/07/2015 Evolving large RIGHT frontal intraparenchymal hematoma. Similar 3 mm RIGHT to LEFT midline shift without ventricular entrapment. However, by my reading, pt midline shift has increased to 6mm, mass effect more prominent at superior frontal region.  04/05/2015: 1. Large RIGHT frontal intracranial hemorrhage is slightly contracted in volume. 2. Decrease in severity of mile leftward midline shift. 3. No new intracranial hemorrhage. 4. No extension to the ventricular system. 5.  Basal cisterns are patent. 04/04/2015  Large acute right frontoparietal intracranial hemorrhage, with evidence of active extravasation and 8 mm leftward midline shift. No evidence of downward herniation.   2D ECHO - Left ventricle: The cavity size was normal. Wall thickness wasincreased in a pattern of moderate LVH. Systolic function wasmildly to moderately reduced. The estimated ejection fraction wasin the range of 40% to 45%. There is akinesis of themid-apicalinferolateral myocardium. Doppler parameters areconsistent with abnormal left ventricular relaxation (grade 1 diastolic dysfunction). - Mitral valve: There was mild regurgitation. - Impressions: No cardiac source of emboli was indentified. Compared to theprior study, there has been no significant interval change.  Carotid Doppler 1-39% internal carotid artery stenosis bilaterally. Vertebral arteries are patent with antegrade flow.  RUE venous doppler Superificial Acute thrombus identified in the Right Cephalic Vein extends down to the forearm. No DVT     HISTORY OF PRESENT ILLNESS Keawe Marcello is an 64 y.o. male with a hx of stroke and CAD  s/p stenting on asa and plavix who presents with acute onset of L sided weakness and found to have Large R ICH on CTH.   HOSPITAL COURSE Mr. Cara Aguino is a 64 y.o. male with history of hypertension, coronary artery disease with previous MI, previous CVA, ischemic cardiomyopathy, hyperlipidemia, PVD, tobacco use, and alcohol abuse presenting with left hemiparesis and left facial droop. CT revealed large R frontoparietal hemorrhage. Started on scheduled mannitol and admitted to ICU. Mannitol changed to 3% with cerebral edema. Continued improvement with discharge from ICU to IP rehab.  Stroke: Large acute right frontoparietal ICH with cerebral edema in setting of hypertension.   Resultant Left hemiparesis, dysarthria, dysphagia  MRI with contrast and MRA head once blood  absorbed  Repeat CT 04/07/15 increased midline shift and mass effect.  Repeat CT 04/09/2015 pending   Carotid Doppler No significant stenosis  2D Echo EF 35-40%. No cardiac source of emboli identified.  LDL - 86  HgbA1c 6.4  Continue VTE prophylaxis - with heparin subq   aspirin 81 mg daily and clopidogrel 75 mg daily prior to admission, now on No antithrombotic secondary to hemorrhage.  Ongoing aggressive stroke risk factor management.  Therapy recommendations: CIR - per consult, pt does not have support at home and concerned about reaching functional independence - CIR vs SNF  Disposition: CIR  Cerebral edema  Initially on scheduled mannitol  Changed to 3% saline  Na up to 148  Weaned 3% 06/09/2014, Day 5   Na 145 day of discharge  Hx stroke/TIA, encephalomalacia  03/2012 R cortical motor strip infarcts  R frontal encephalomalacia  Concerning for embolic infarcts with low EF  Consider outpt 30 day cardiac event monitoring to look for embolic source  RUE catheter related superficial thrombophlebitis   RUE hard, warm and redness locally  UE venous Doppler with SVT, no DVT  Hot/cold compression locally  No anticoagulation needed at this time.  DVT prophylaxis with heparin subq  Ok for discharge to IP rehab  Dysphagia  Secondary to hemorrhage  DIET - DYS 1 Room service appropriate?: Yes; Fluid consistency:: Nectar Thick   ST following  Add ensure pudding (kozyshack)  continue IVF NS to 30cc/h due to poor po intake  Hypertensive Urgency  Stable this am  SBP goal to < 160  cardene off 0045  Resumed home po medications - coreg and losartan.  Increased losartan to  daily  BP better controlled day of discharge  Hyperlipidemia  Home meds: Lipitor 80 mg daily prior to admission - held secondary to ICH.  LDL 86  Continue statin at discharge  Tobacco abuse  Current smoker  Smoking cessation counseling provided  Pt is  willing to quit  Other Stroke Risk Factors  Advanced age  ETOH use  Family hx stroke (sister)  Coronary artery disease s/p stenting on DAPT prior to admission  Dental caries, needs 9 teeth removed per pt   DISCHARGE EXAM Blood pressure 154/108, pulse 57, temperature 97.9 F (36.6 C), temperature source Oral, resp. rate 30, height  (1.651 m), weight 71.1 kg (156 lb 12 oz), SpO2 100 %. HEENT- Normocephalic, no lesions, without obvious abnormality. Normal external eye and conjunctiva.   Cardiovascular- regular rate and rhythm, S1, S2 normal, no murmur, click, rub or gallop, pulses palpable throughout   Lungs- chest clear, no wheezing, rales, normal symmetric air entry  Extremities- no edema  Neurological Examination Mental Status: Alert, oriented to name, date, day, month, year, thought content appropriate.dysarthria.Naming intact. No  aphasia.   Cranial Nerves: II: Visual fields grossly normal, pupils equal, round, reactive to light, R gaze preference improved III,IV, VI: ptosis not present, extra-ocular movements intact bilaterally V,VII: L facial droop, intact sensory bilat VIII: hearing normal to voice bilaterally IX,X: uvula rises symmetrically XI: bilateral shoulder shrug XII: midline tongue extension Motor: Right :Upper 5/5Left:Upper2/5 Right Lower5/5Left Lower extremity 4+/5 Tone: Decreased tone left arm Sensory: symmetrical b/l DTR: 1+ throughout with left babinski positive Coordination: No dysmetria on the right. Cannot test left due to weakness Gait not tested due to weakness.   Discharge Diet  DIET - DYS 1 Room service appropriate?: Yes; Fluid consistency:: Nectar Thick liquids  DISCHARGE PLAN  Disposition:  Transfer to [facility] for ongoing PT, OT and ST  Due to hemorrhage and risk of bleeding, do not take aspirin, aspirin-containing medications, or ibuprofen products   Recommend ongoing risk factor  control by Primary Care Physician at time of discharge from inpatient rehabilitation.  Follow-up [name], MD in 2 weeks following discharge from rehab.  Follow-up with Dr. Marvel Plan, Stroke Clinic in 2 months.   45 minutes were spent preparing discharge.  Rhoderick Moody [facility] See Amion for Pager information 04/10/2015 2:14 PM   I, the attending vascular neurologist, have personally obtained a history, examined the patient, evaluated laboratory data, individually viewed imaging studies and agree with radiology interpretations. Together with the NP/PA, we formulated the assessment and plan of care which reflects our mutual decision.  I have made any additions or clarifications directly to the above note and agree with the findings and plan as currently documented.   Marvel Plan, MD PhD Stroke Neurology 04/10/2015 3:47 PM

## 2015-04-10 NOTE — Progress Notes (Signed)
I met with pt at bedside to discuss an inpt rehab admission pending medical readiness per Dr. Erlinda Hong. Hopeful to admit today. Pt is in agreement to admission. I will contact his sister, Harolyn Rutherford, today to discuss. [phone]

## 2015-04-11 ENCOUNTER — Inpatient Hospital Stay (HOSPITAL_COMMUNITY): Payer: Medicare Other

## 2015-04-11 ENCOUNTER — Inpatient Hospital Stay (HOSPITAL_COMMUNITY): Payer: Medicare Other | Admitting: Speech Pathology

## 2015-04-11 ENCOUNTER — Inpatient Hospital Stay (HOSPITAL_COMMUNITY): Payer: Medicare Other | Admitting: Occupational Therapy

## 2015-04-11 DIAGNOSIS — E876 Hypokalemia: Secondary | ICD-10-CM

## 2015-04-11 DIAGNOSIS — I69354 Hemiplegia and hemiparesis following cerebral infarction affecting left non-dominant side: Secondary | ICD-10-CM | POA: Diagnosis present

## 2015-04-11 DIAGNOSIS — I10 Essential (primary) hypertension: Secondary | ICD-10-CM

## 2015-04-11 DIAGNOSIS — D62 Acute posthemorrhagic anemia: Secondary | ICD-10-CM | POA: Diagnosis present

## 2015-04-11 DIAGNOSIS — I69991 Dysphagia following unspecified cerebrovascular disease: Secondary | ICD-10-CM

## 2015-04-11 DIAGNOSIS — I63412 Cerebral infarction due to embolism of left middle cerebral artery: Secondary | ICD-10-CM

## 2015-04-11 DIAGNOSIS — I69154 Hemiplegia and hemiparesis following nontraumatic intracerebral hemorrhage affecting left non-dominant side: Secondary | ICD-10-CM | POA: Diagnosis not present

## 2015-04-11 LAB — COMPREHENSIVE METABOLIC PANEL
ALBUMIN: 2.7 g/dL — AB (ref 3.5–5.0)
ALK PHOS: 49 U/L (ref 38–126)
ALT: 54 U/L (ref 17–63)
ANION GAP: 10 (ref 5–15)
AST: 38 U/L (ref 15–41)
BILIRUBIN TOTAL: 0.7 mg/dL (ref 0.3–1.2)
BUN: 8 mg/dL (ref 6–20)
CALCIUM: 9.2 mg/dL (ref 8.9–10.3)
CO2: 26 mmol/L (ref 22–32)
CREATININE: 0.8 mg/dL (ref 0.61–1.24)
Chloride: 107 mmol/L (ref 101–111)
GFR calc non Af Amer: >60 mL/min (ref >60)
GLUCOSE: 104 mg/dL — AB (ref 65–99)
Potassium: 3.4 mmol/L — ABNORMAL LOW (ref 3.5–5.1)
Sodium: 143 mmol/L (ref 135–145)
TOTAL PROTEIN: 6.2 g/dL — AB (ref 6.5–8.1)

## 2015-04-11 LAB — CBC WITH DIFFERENTIAL/PLATELET
Basophils Absolute: 0 10*3/uL (ref 0.0–0.1)
Basophils Relative: 0 %
Eosinophils Absolute: 0.3 10*3/uL (ref 0.0–0.7)
Eosinophils Relative: 5 %
HEMATOCRIT: 36.1 % — AB (ref 39.0–52.0)
HEMOGLOBIN: 11.6 g/dL — AB (ref 13.0–17.0)
LYMPHS ABS: 1 10*3/uL (ref 0.7–4.0)
Lymphocytes Relative: 22 %
MCH: 27.8 pg (ref 26.0–34.0)
MCHC: 32.1 g/dL (ref 30.0–36.0)
MCV: 86.6 fL (ref 78.0–100.0)
MONOS PCT: 13 %
Monocytes Absolute: 0.6 10*3/uL (ref 0.1–1.0)
NEUTROS ABS: 2.9 10*3/uL (ref 1.7–7.7)
NEUTROS PCT: 60 %
Platelets: 169 10*3/uL (ref 150–400)
RBC: 4.17 MIL/uL — ABNORMAL LOW (ref 4.22–5.81)
RDW: 14.7 % (ref 11.5–15.5)
WBC: 4.8 10*3/uL (ref 4.0–10.5)

## 2015-04-11 MED ORDER — POTASSIUM CHLORIDE 20 MEQ PO PACK
40.0000 meq | PACK | Freq: Once | ORAL | Status: AC
  Administered 2015-04-11: 40 meq via ORAL
  Filled 2015-04-11: qty 2

## 2015-04-11 MED ORDER — CARVEDILOL 12.5 MG PO TABS
12.5000 mg | ORAL_TABLET | Freq: Two times a day (BID) | ORAL | Status: DC
  Administered 2015-04-11 – 2015-04-12 (×3): 12.5 mg via ORAL
  Filled 2015-04-11 (×3): qty 1

## 2015-04-11 NOTE — IPOC Note (Signed)
Overall Plan of Care [facility]) Patient Details Name: Randall Patel MRN: [id number] DOB: [birth year]  Admitting Diagnosis: ich yes slp mod assist   Hospital Problems: Active Problems:   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)   Hemiparesis affecting left side as late effect of stroke (HCC)   Dysphagia as late effect of cerebrovascular disease   Essential hypertension   Acute blood loss anemia     Functional Problem List: Nursing Endurance, Medication Management, Motor, Nutrition, Safety  PT Balance, Behavior, Endurance, Motor, Perception, Safety, Sensory  OT Balance, Behavior, Cognition, Endurance, Motor, Perception, Safety, Vision  SLP Cognition, Linguistic, Nutrition, Safety  TR         Basic ADL's: OT Grooming, Eating, Bathing, Dressing, Toileting     Advanced  ADL's: OT Light Housekeeping     Transfers: PT Bed to Chair, Bed Mobility, Car, Occupational psychologist, Research scientist (life sciences): PT Ambulation, Stairs     Additional Impairments: OT Fuctional Use of Upper Extremity  SLP Swallowing, Communication, Social Cognition expression Problem Solving, Memory, Attention, Awareness  TR      Anticipated Outcomes Item Anticipated Outcome  Self Feeding set up A  Swallowing  Min assist    Basic self-care  overall Supervision  Toileting  supervision   Bathroom Transfers supervision  Bowel/Bladder  Continent to bowel and bladder, with min. I.  Transfers  supervision overall  Locomotion  supervision gait; min assist stairs  Communication  Supervision   Cognition  Min assist   Pain  Less than 3,on 1 to 10 scale.  Safety/Judgment  Free from falls during his stay in rehab.   Therapy Plan: PT Intensity: Minimum of 1-2 x/day ,45 to 90 minutes PT Frequency: 5 out of 7 days PT Duration Estimated Length of Stay: 14-17 days OT Intensity: Minimum of 1-2 x/day, 45 to 90 minutes OT Frequency: 5 out of 7 days OT Duration/Estimated Length of Stay:  14-17 days SLP Intensity: Minumum of 1-2 x/day, 30 to 90 minutes SLP Frequency: 3 to 5 out of 7 days SLP Duration/Estimated Length of Stay: 14-17 days        Team Interventions: Nursing Interventions Patient/Family Education, Medication Management, Dysphagia/Aspiration Printmaker, Discharge Planning  PT interventions Ambulation/gait training, Warden/ranger, Cognitive remediation/compensation, Community reintegration, Discharge planning, Disease management/prevention, DME/adaptive equipment instruction, Functional mobility training, Neuromuscular re-education, Pain management, Patient/family education, Psychosocial support, Splinting/orthotics, Stair training, Therapeutic Activities, Therapeutic Exercise, UE/LE Strength taining/ROM, UE/LE Coordination activities, Visual/perceptual remediation/compensation, Wheelchair propulsion/positioning  OT Interventions Warden/ranger, Firefighter, Chief of Staff, Equities trader education, Self Care/advanced ADL retraining, Therapeutic Exercise, UE/LE Coordination activities, Wheelchair propulsion/positioning, UE/LE Strength taining/ROM, Therapeutic Activities, Visual/perceptual remediation/compensation, Psychosocial support, Functional mobility training, DME/adaptive equipment instruction, Discharge planning, Cognitive remediation/compensation  SLP Interventions Cognitive remediation/compensation, Cueing hierarchy, Functional tasks, Dysphagia/aspiration precaution training, Multimodal communication approach, Patient/family education, Internal/external aids  TR Interventions    SW/CM Interventions Discharge Planning, Psychosocial Support    Team Discharge Planning: Destination: PT-Home ,OT- Home , SLP-Home Projected Follow-up: PT-Home health PT, 24 hour supervision/assistance, OT-  Home health OT, 24 hour supervision/assistance, SLP-Home Health SLP, Outpatient SLP, 24 hour  supervision/assistance Projected Equipment Needs: PT-To be determined, OT- 3 in 1 bedside comode, Tub/shower bench, SLP-To be determined Equipment Details: PT- , OT-  Patient/family involved in discharge planning: PT- Patient,  OT-Patient, SLP-Patient  MD ELOS: 10-14 days. Medical Rehab Prognosis:  Good Assessment: 64 year old male with history of HTN, CAD with ICM, polysubstance abuse, CVA 2013 with residual L-HP  who was admitted on 04/04/15 with HA, elevated BP and acute onset of left facial droop and left arm weakness. UDS positive for THC. CT head done revealing large right frontoparietal ICH and he was started on cardene drip and IV mannitol. Chronic ASA/plavix discontinued and Dr. Yetta Barre recommended conservative management with serial CCT. 2D echo done revealing EF 40-45% with moderate LVH and mild MVR. Repeat CT head with increase in midline shift and mass effect and mannitol changed to hypertonic saline with goal 150-155.  Patient has had confusion with fluctuating bouts of lethargy. MBS done 12/27 and patient was placed on dysphagia 1, nectar liquids. Dr. Roda Shutters feels that patient had R-ICH with cerebral edema in setting of HTN and to consider outpatient 30 day cardiac event monitor to rule out embolic source. Blood pressures remain labile and po intake is poor. Patient with resultant LUE > LLE weakness, balance deficits, left inattention, poor safety awareness and dysphagia.  Evidence of R cephalic but no axillary DVT. No anticoagulation recommended.  See Team Conference Notes for weekly updates to the plan of care

## 2015-04-11 NOTE — Progress Notes (Signed)
Initial Nutrition Assessment  DOCUMENTATION CODES:   Not applicable  INTERVENTION:  Continue Ensure Enlive po TID, each supplement provides 350 kcal and 20 grams of protein.  Encourage adequate PO intake.  NUTRITION DIAGNOSIS:   Inadequate oral intake related to poor appetite as evidenced by per patient/family report.  GOAL:   Patient will meet greater than or equal to 90% of their needs  MONITOR:   PO intake, Supplement acceptance, Diet advancement, Weight trends, Labs, I & O's  REASON FOR ASSESSMENT:   Malnutrition Screening Tool    ASSESSMENT:   64 y.o. 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 Presented 04/04/2015 with headache and blood pressure 189/128. Denies any recent trauma. Cranial CT scan showed a large right frontoparietal intracerebral hemorrhage. Follow-up cranial CT scan 04/07/2015 with evolving right frontal intraparenchymal hematoma right to left midline shift without ventricular entrapment.  Meal completion has been 60%. Pt reports his intake has been improving. He reports eating well PTA with at least 3 meals a day with no other difficulties. Weight has been stable. Pt currently has Ensure ordered and has been consuming them. RD to continue with current orders. Pt was encouraged to eat his food at meals and to drink his supplements.   Pt with no observed significant fat or muscle mass loss.   Labs and medications reviewed.   Diet Order:  DIET - DYS 1 Room service appropriate?: Yes; Fluid consistency:: Nectar Thick  Skin:  Reviewed, no issues  Last BM:  12/28  Height:   Ht Readings from Last 1 Encounters:  04/10/15 5\' 5"  (1.651 m)    Weight:   Wt Readings from Last 1 Encounters:  04/10/15 162 lb 4.1 oz (73.6 kg)    Ideal Body Weight:  61.8 kg  BMI:  Body mass index is 27 kg/(m^2).  Estimated Nutritional Needs:   Kcal:   1900-2100  Protein:  90-100 grams  Fluid:  1.9 - 2.1 L/day  EDUCATION NEEDS:   No education needs identified at this time  Roslyn Smiling, MS, RD, LDN Pager # [phone] After hours/ weekend pager # [phone]

## 2015-04-11 NOTE — Evaluation (Signed)
Occupational Therapy Assessment and Plan  Patient Details  Name: Randall Patel MRN: [id number] Date of Birth: [birth year]  OT Diagnosis: abnormal posture, cognitive deficits, hemiplegia affecting non-dominant side, muscle weakness (generalized) and coordination disorder Rehab Potential: Rehab Potential (ACUTE ONLY): Fair ELOS: 14-17 days   Today's Date: 04/11/2015 OT Individual Time: 9563-8756 OT Individual Time Calculation (min): 60 min     Problem List:  Patient Active Problem List   Diagnosis Date Noted  . Hemiparesis affecting left side as late effect of stroke ([place])   . Dysphagia as late effect of cerebrovascular disease   . Essential hypertension   . Acute blood loss anemia   . 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 Dysphagia   . Dysarthria due to cerebrovascular accident   . Diastolic dysfunction   . Tobacco abuse   . ETOH abuse   . History of CVA with residual deficit   . Coronary artery disease involving native coronary artery of native heart with angina pectoris ([place])   . Tachypnea   . Hypernatremia   . Hypokalemia   . Thrombocytopenia ([place])   . Intracranial hemorrhage ([place])   . Essential hypertension, malignant   . ICH (intracerebral hemorrhage) ([place]) 04/04/2015  . STEMI (ST elevation myocardial infarction) ([place]) 04/14/2012  . NSTEMI (non-ST elevated myocardial infarction) ([place]) 04/14/2012  . Cardiomyopathy, ischemic 04/14/2012  . Cerebral embolism with cerebral infarction ([place]) 04/03/2012  . Unspecified transient cerebral ischemia 04/02/2012  . Hemiplegia, unspecified, affecting nondominant side 04/02/2012  . CAD (coronary artery disease) s/p stent 2008 in [place] 04/02/2012  . Hypertension     Past Medical History:  Past Medical History  Diagnosis Date  . Hypertension   . Coronary artery disease     MI in 2008 with PCI to ? vessel; NSTEMI 03/2012; NSTEMI 04/2012 Cath showed triple vessel dz including occluded RCA, s/p DES  to distal RCA, PTCA posterolateral branch   . CVA (cerebral infarction)     03/2012 with left arm weakness/numbness and left facial droop.  . Ischemic cardiomyopathy     04/2012 EF 35-40%  . Hyperlipidemia   . Claudication ([place])   . Tobacco abuse     quit 04/06/12  . ETOH abuse     quit 04/06/12  . Stroke [facility])    Past Surgical History:  Past Surgical History  Procedure Laterality Date  . Hernia repair    . Left heart catheterization with coronary angiogram N/A 04/11/2012    Procedure: LEFT HEART CATHETERIZATION WITH CORONARY ANGIOGRAM;  Surgeon: Burnell Blanks, MD;  Location: [facility] CATH LAB;  Service: Cardiovascular;  Laterality: N/A;  . Percutaneous coronary stent intervention (pci-s)  04/11/2012    Procedure: PERCUTANEOUS CORONARY STENT INTERVENTION (PCI-S);  Surgeon: Burnell Blanks, MD;  Location: [facility] CATH LAB;  Service: Cardiovascular;;  Distal RCA  . Percutaneous coronary intervention-balloon only  04/11/2012    Procedure: PERCUTANEOUS CORONARY INTERVENTION-BALLOON ONLY;  Surgeon: Burnell Blanks, MD;  Location: [facility] CATH LAB;  Service: Cardiovascular;;  RPLA    Assessment & Plan Clinical Impression: Patient is a 64 y.o. 64 year old male with history of HTN, CAD with ICM, polysubstance abuse, 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 UDS positive for THC. CT head done revealing large right frontoparietal ICH and he was started on cardene drip and IV mannitol. Chronic ASA/plavix discontinued and Dr. [name] recommended conservative  management with serial CCT. 2D echo done revealing EF 40-45% with moderate LVH and mild MVR. Repeat CT head with increase in midline shift and mass effect and mannitol changed to hypertonic saline with goal 150-155.  Patient has had confusion with fluctuating bouts of lethargy. Carotid dopplers without significant ICA stenosis. MBS done  12/27 and patient was placed on dysphagia 1, nectar liquids. Dr. Erlinda Hong feels that patient had R-ICH with cerebral edema in setting of HTN and to consider outpatient 30 day cardiac event monitor to rule out embolic source. Hypertonic saline d/c yesterday and sodium normalizing. Blood pressures remain labile and po intake is poor. Patient with resultant LUE > LLE weakness, balance deficits, left inattention, poor safety awareness and dysphagia.Right upper ext pain and swelling evaluated today. Evidence of R cephalic but no axillary DVT. No anticoagulation recommended CIR recommended by MD and rehab team for follow up therapy and patient cleared medically today. Patient transferred to CIR on 04/10/2015 .    Patient currently requires mod- total A with basic self-care skills secondary to muscle weakness, decreased cardiorespiratoy endurance, abnormal tone, decreased coordination and decreased motor planning, inattention, decreased initiation, decreased attention, decreased awareness, decreased problem solving and decreased safety awareness and decreased sitting balance, decreased standing balance, decreased postural control, hemiplegia and decreased balance strategies.  Prior to hospitalization, patient could complete ADLs and IADLs with independent .  Patient will benefit from skilled intervention to increase independence with basic self-care skills prior to discharge home with care partner.  Anticipate patient will require 24 hour supervision and follow up home health.  OT - End of Session Activity Tolerance: Decreased this session Endurance Deficit: Yes Endurance Deficit Description: Pt requiring multiple rest breaks secondary to fatigue. OT Assessment Rehab Potential (ACUTE ONLY): Fair Barriers to Discharge:  (none known at this time) OT Patient demonstrates impairments in the following area(s): Balance;Behavior;Cognition;Endurance;Motor;Perception;Safety;Vision OT Basic ADL's Functional Problem(s):  Grooming;Eating;Bathing;Dressing;Toileting OT Advanced ADL's Functional Problem(s): Light Housekeeping OT Transfers Functional Problem(s): Toilet;Tub/Shower OT Additional Impairment(s): Fuctional Use of Upper Extremity OT Plan OT Intensity: Minimum of 1-2 x/day, 45 to 90 minutes OT Frequency: 5 out of 7 days OT Duration/Estimated Length of Stay: 14-17 days OT Treatment/Interventions: Medical illustrator training;Community reintegration;Neuromuscular re-education;Patient/family education;Self Care/advanced ADL retraining;Therapeutic Exercise;UE/LE Coordination activities;Wheelchair propulsion/positioning;UE/LE Strength taining/ROM;Therapeutic Activities;Visual/perceptual remediation/compensation;Psychosocial support;Functional mobility training;DME/adaptive equipment instruction;Discharge planning;Cognitive remediation/compensation OT Self Feeding Anticipated Outcome(s): set up A OT Basic Self-Care Anticipated Outcome(s): overall Supervision OT Toileting Anticipated Outcome(s): supervision OT Bathroom Transfers Anticipated Outcome(s): supervision OT Recommendation Patient destination: Home Follow Up Recommendations: Home health OT;24 hour supervision/assistance Equipment Recommended: 3 in 1 bedside comode;Tub/shower bench   Skilled Therapeutic Intervention Upon entering the room, pt supine in bed awaiting therapist arrival with no c/o pain. OT educated pt on OT purpose,POC, and goals with pt verbalizing understanding. Pt engaged in bathing while seated on EOB with min A and mod verbal cues for weight shift to R as pt has L lateral lean. Pt transferring with Mod A stand pivot transfer bed > BSC for BM. Pt requiring assistance for clothing management and hygiene with mod A for balance during standing. Pt transferred to recliner chair with quick release belt placed and call bell with all needed items within reach upon exiting the room.   OT Evaluation Precautions/Restrictions   Precautions Precautions: Fall Precaution Comments: L hemiparesis, L inattention Restrictions Weight Bearing Restrictions: No Vital Signs Therapy Vitals Pulse Rate: 64 BP: (!) 181/104 mmHg Patient Position (if appropriate): Sitting Pain Pain Assessment Pain Assessment: No/denies pain Home Living/Prior  Functioning Home Living Family/patient expects to be discharged to:: Private residence Living Arrangements: Other relatives (sister) Available Help at Discharge: Family, Available 24 hours/day Type of Home: House Home Access: Stairs to enter [employer] of Steps: 4 Entrance Stairs-Rails: Right, Left, Can reach both Home Layout: One level Bathroom Shower/Tub: Tub/shower unit, Industrial/product designer: Yes  Lives With: Family (sister) IADL History IADL Comments: reports he helps with cleaning only Prior Function Level of Independence: Independent with basic ADLs, Independent with homemaking with ambulation, Independent with gait, Independent with transfers  Able to Take Stairs?: Yes Driving: Yes Vocation: On disability Vision/Perception  Vision- History Baseline Vision/History: Glaucoma (pt reports in L eye) Patient Visual Report: No change from baseline Vision- Assessment Vision Assessment?: Yes Eye Alignment: Within Functional Limits Alignment/Gaze Preference: Gaze right Tracking/Visual Pursuits: Decreased smoothness of horizontal tracking Additional Comments: With verbal cues pt tracks to the L. Perception Perception: Impaired Inattention/Neglect: Does not attend to left visual field;Does not attend to left side of body Comments: inattention; with max cues able to attend to the L  Cognition Overall Cognitive Status: Impaired/Different from baseline Arousal/Alertness: Awake/alert Orientation Level: Person;Place;Situation Person: Oriented Place: Oriented Situation: Oriented Year: 2016 Month: December Day of Week:  Correct Memory: Appears intact Immediate Memory Recall: Sock;Blue;Bed Memory Recall: Sock;Blue;Bed Memory Recall Sock: With Cue Memory Recall Blue: Without Cue Memory Recall Bed: Without Cue Attention: Sustained Sustained Attention: Impaired Sustained Attention Impairment: Functional basic Awareness: Impaired Awareness Impairment: Intellectual impairment Problem Solving: Impaired Problem Solving Impairment: Verbal complex;Functional complex Executive Function: Initiating;Reasoning;Self Monitoring;Self Correcting Initiating: Impaired Initiating Impairment: Functional basic Self Monitoring: Impaired Self Monitoring Impairment: Functional basic Behaviors: Lability Safety/Judgment: Impaired Comments: Pt breaking down in tears several times during evaluation Sensation Sensation Light Touch: Appears Intact Stereognosis: Not tested Hot/Cold: Not tested Proprioception: Impaired Detail Proprioception Impaired Details: Impaired LUE Coordination Gross Motor Movements are Fluid and Coordinated: No Fine Motor Movements are Fluid and Coordinated: No Motor  Motor Motor: Hemiplegia;Abnormal tone;Abnormal postural alignment and control Motor - Skilled Clinical Observations: L hemiplegia, general weakness Mobility  Bed Mobility Bed Mobility: Supine to Sit Supine to Sit: 3: Mod assist Supine to Sit Details: Verbal cues for sequencing;Verbal cues for technique;Verbal cues for precautions/safety;Manual facilitation for placement;Manual facilitation for weight shifting  Trunk/Postural Assessment  Cervical Assessment Cervical Assessment: Exceptions to [facility] (head turned to R) Thoracic Assessment Thoracic Assessment: Exceptions to [facility] (flexed posture) Lumbar Assessment Lumbar Assessment: Exceptions to [facility] (posterior pelvic tilt, and L lateral lean) Postural Control Postural Control: Deficits on evaluation  Balance Balance Balance Assessed: Yes Static Sitting Balance Static Sitting - Level  of Assistance: 5: Stand by assistance Dynamic Sitting Balance Dynamic Sitting - Level of Assistance: 4: Min assist Static Standing Balance Static Standing - Level of Assistance: 3: Mod assist;4: Min assist Dynamic Standing Balance Dynamic Standing - Level of Assistance: 3: Mod assist;2: Max assist Extremity/Trunk Assessment RUE Assessment RUE Assessment: Within Functional Limits LUE Assessment LUE Assessment: Exceptions to [facility] (0/5 strength throughout, flaccid)   See Function Navigator for Current Functional Status.   Refer to Care Plan for Long Term Goals  Recommendations for other services: None  Discharge Criteria: Patient will be discharged from OT if patient refuses treatment 3 consecutive times without medical reason, if treatment goals not met, if there is a change in medical status, if patient makes no progress towards goals or if patient is discharged from hospital.  The above assessment, treatment plan, treatment alternatives and goals were discussed and mutually agreed upon:  by patient  [name] 04/11/2015, 1:04 PM

## 2015-04-11 NOTE — Evaluation (Signed)
Speech Language Pathology Assessment and Plan  Patient Details  Name: Randall Patel MRN: [id number] Date of Birth: May 11, 1950  SLP Diagnosis: Dysphagia;Dysarthria;Cognitive Impairments  Rehab Potential: Good ELOS: 14-17 days     Today's Date: 04/11/2015 SLP Individual Time: 1300-1400 SLP Individual Time Calculation (min): 60 min   Problem List:  Patient Active Problem List   Diagnosis Date Noted  . Hemiparesis affecting left side as late effect of stroke ([place])   . Dysphagia as late effect of cerebrovascular disease   . Essential hypertension   . Acute blood loss anemia   . 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 Dysphagia   . Dysarthria due to cerebrovascular accident   . Diastolic dysfunction   . Tobacco abuse   . ETOH abuse   . History of CVA with residual deficit   . Coronary artery disease involving native coronary artery of native heart with angina pectoris ([place])   . Tachypnea   . Hypernatremia   . Hypokalemia   . Thrombocytopenia ([place])   . Intracranial hemorrhage ([place])   . Essential hypertension, malignant   . ICH (intracerebral hemorrhage) ([place]) 04/04/2015  . STEMI (ST elevation myocardial infarction) ([place]) 04/14/2012  . NSTEMI (non-ST elevated myocardial infarction) ([place]) 04/14/2012  . Cardiomyopathy, ischemic 04/14/2012  . Cerebral embolism with cerebral infarction ([place]) 04/03/2012  . Unspecified transient cerebral ischemia 04/02/2012  . Hemiplegia, unspecified, affecting nondominant side 04/02/2012  . CAD (coronary artery disease) s/p stent 2008 in [place] 04/02/2012  . Hypertension    Past Medical History:  Past Medical History  Diagnosis Date  . Hypertension   . Coronary artery disease     MI in 2008 with PCI to ? vessel; NSTEMI 03/2012; NSTEMI 04/2012 Cath showed triple vessel dz including occluded RCA, s/p DES to distal RCA, PTCA posterolateral branch   . CVA (cerebral infarction)     03/2012 with left arm  weakness/numbness and left facial droop.  . Ischemic cardiomyopathy     04/2012 EF 35-40%  . Hyperlipidemia   . Claudication ([place])   . Tobacco abuse     quit 04/06/12  . ETOH abuse     quit 04/06/12  . Stroke [facility])    Past Surgical History:  Past Surgical History  Procedure Laterality Date  . Hernia repair    . Left heart catheterization with coronary angiogram N/A 04/11/2012    Procedure: LEFT HEART CATHETERIZATION WITH CORONARY ANGIOGRAM;  Surgeon: Burnell Blanks, MD;  Location[facility] CATH LAB;  Service: Cardiovascular;  Laterality: N/A;  . Percutaneous coronary stent intervention (pci-s)  04/11/2012    Procedure: PERCUTANEOUS CORONARY STENT INTERVENTION (PCI-S);  Surgeon: Burnell Blanks, MD;  Location: [facility] CATH LAB;  Service: Cardiovascular;;  Distal RCA  . Percutaneous coronary intervention-balloon only  04/11/2012    Procedure: PERCUTANEOUS CORONARY INTERVENTION-BALLOON ONLY;  Surgeon: Burnell Blanks, MD;  Location: [facility] CATH LAB;  Service: Cardiovascular;;  RPLA    Assessment / Plan / Recommendation Clinical Impression   [name] is a 64 year old male with history of HTN, CAD with ICM, polysubstance abuse, 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 UDS positive for THC. CT head done revealing large right frontoparietal ICH.  Pt admitted to CIR on 04/10/2015.  SLP evaluation completed on 04/11/2015 with the following results:  Pt presents with s/s of a mild-moderate oropharyngeal dysphagia consistent with most  recent instrumental study.  Pt presents with left sided labial, lingual, and buccal weakness which results in almost constant anterior loss of boluses during PO intake as well as poor containment of saliva outside of PO intake.  Pt exhibits poor awareness of boluses with weakened manipulation of purees and trials of solids.  Pt's mastication during trials of  solids was ineffective and resulted in anterior loss of whole portions of crackers.  Pt required max assist verbal cues for use of swallowing precautions during PO intake of lunch meal.  Trials of thin liquids resulted in delayed, soft throat clear and wet vocal quality when challenged with larger, consecutive boluses.  Smaller sips were tolerated well without overt s/s of aspiration.   Pt demonstrates a mild dysarthria due to the abovementioned oral motor deficits which impact his articulatory precision with production of consonants.  His intelligibility is only mildly decreased at the conversational level.  Pt also presents with severe cognitive deficits characterized by decreased sustained attention to tasks, poor intellectual awareness of deficits, flat affect, poor initiation, and left inattention consistent with right hemispheric brain dysfunction.   Pt has experienced a significant loss of function s/p ICH and would benefit from skilled ST while inpatient in order to maximize functional independence and reduce burden of care prior to discharge.  Anticipate that pt will need close 24/7 supervision and ST follow up at next level of care.    Skilled Therapeutic Interventions          Cognitive-linguistic evaluation completed with results and recommendations reviewed with patient.  SLP provided skilled education regarding anticipated recommendations for 24/7 supervision and ST follow up at discharge due to cognitive deficits.   SLP also discussed short term and long term goals to be addressed on CIR with patient.  Patient presents with poor insight into his deficits but does report feeling "slower" s/p ICH.   All questions were answered to his satisfaction at this time.      SLP Assessment  Patient will need skilled [place] Pathology Services during CIR admission    Recommendations  Patient destination: Home Follow up Recommendations: Home Health SLP;Outpatient SLP;24 hour  supervision/assistance Equipment Recommended: To be determined    SLP Frequency 3 to 5 out of 7 days   SLP Treatment/Interventions Cognitive remediation/compensation;Cueing hierarchy;Functional tasks;Dysphagia/aspiration precaution training;Multimodal communication approach;Patient/family education;Internal/external aids   Pain Pain Assessment Pain Assessment: No/denies pain Prior Functioning Type of Home: House  Lives With: Family (sister) Available Help at Discharge: Family;Available 24 hours/day Education: 11th grade Vocation: On disability  Function:  Eating Eating   Modified Consistency Diet: Yes Eating Assist Level: Supervision or verbal cues;Helper checks for pocketed food           Cognition Comprehension Comprehension assist level: Understands basic 90% of the time/cues < 10% of the time  Expression   Expression assist level: Expresses basic 75 - 89% of the time/requires cueing 10 - 24% of the time. Needs helper to occlude trach/needs to repeat words.  Social Interaction Social Interaction assist level: Interacts appropriately 50 - 74% of the time - May be physically or verbally inappropriate.  Problem Solving Problem solving assist level: Solves basic 25 - 49% of the time - needs direction more than half the time to initiate, plan or complete simple activities  Memory Memory assist level: Recognizes or recalls 50 - 74% of the time/requires cueing 25 - 49% of the time   Short Term Goals: Week 1: SLP Short Term Goal 1 (Week 1):  Pt will consume dys 1 textures and nectar thick liquids with mod assist verbal cues to monitor and correct anterior loss of boluses over 2 targeted sessions.   SLP Short Term Goal 2 (Week 1): Pt will consume trials of dys 2 textures with mod assist verbal cues to monitor and correct left sided buccal residue over 3 targeted sessions prior to advancement.   SLP Short Term Goal 3 (Week 1): Pt will consume therapeutic trials of regular, unthickened  water over 2 targeted sessions with min assist verbal cues for use of safe swallowing precautions and minimal overt s/s of aspiration prior to repeat MBS  SLP Short Term Goal 4 (Week 1): Pt will attend to the left environment during basic, familiar tasks with max assist verbal cues.   SLP Short Term Goal 5 (Week 1): Pt will sustain his attention to a basic, familiar task for 3-5 minute intervals with mod assist verbal cues for redirection.    Refer to Care Plan for Long Term Goals  Recommendations for other services: None  Discharge Criteria: Patient will be discharged from SLP if patient refuses treatment 3 consecutive times without medical reason, if treatment goals not met, if there is a change in medical status, if patient makes no progress towards goals or if patient is discharged from hospital.  The above assessment, treatment plan, treatment alternatives and goals were discussed and mutually agreed upon: by patient  Emilio Math 04/11/2015, 4:11 PM

## 2015-04-11 NOTE — H&P (View-Only) (Signed)
Physical Medicine and Rehabilitation Admission H&P   Chief Complaint  Patient presents with  . 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 UDS positive for THC. CT head done revealing large right frontoparietal ICH and he was started on cardene drip and IV mannitol. Chronic ASA/plavix discontinued and Dr. [name] recommended conservative management with serial CCT. 2D echo done revealing EF 40-45% with moderate LVH and mild MVR. Repeat CT head with increase in midline shift and mass effect and mannitol changed to hypertonic saline with goal 150-155.  Patient has had confusion with fluctuating bouts of lethargy. Carotid dopplers without significant ICA stenosis. MBS done 12/27 and patient was placed on dysphagia 1, nectar liquids. Dr. Erlinda Hong feels that patient had R-ICH with cerebral edema in setting of HTN and to consider outpatient 30 day cardiac event monitor to rule out embolic source. Hypertonic saline d/c yesterday and sodium normalizing. Blood pressures remain labile and po intake is poor. Patient with resultant LUE > LLE weakness, balance deficits, left inattention, poor safety awareness and dysphagia.Right upper ext pain and swelling evaluated today.  Evidence of R cephalic but no axillary DVT.  No anticoagulation recommended CIR recommended by MD and rehab team for follow up therapy and patient cleared medically today.    Review of Systems  HENT:   Needs multiple teeth extracted.  Eyes: Negative for blurred vision and double vision.  Respiratory: Positive for cough and sputum production. Negative for shortness of breath.  Cardiovascular: Negative for chest pain and palpitations.  Gastrointestinal: Negative for heartburn, nausea, vomiting and  constipation.  Genitourinary: Negative for dysuria and urgency.  Musculoskeletal: Negative for myalgias, back pain and neck pain.  Skin: Negative for itching.  Neurological: Positive for speech change and focal weakness. Negative for dizziness, tingling, sensory change and headaches.  Psychiatric/Behavioral: The patient has insomnia.    Past Medical History  Diagnosis Date  . Hypertension   . Coronary artery disease     MI in 2008 with PCI to ? vessel; NSTEMI 03/2012; NSTEMI 04/2012 Cath showed triple vessel dz including occluded RCA, s/p DES to distal RCA, PTCA posterolateral branch   . CVA (cerebral infarction)     03/2012 with left arm weakness/numbness and left facial droop.  . Ischemic cardiomyopathy     04/2012 EF 35-40%  . Hyperlipidemia   . Claudication ([place])   . Tobacco abuse     quit 04/06/12  . ETOH abuse     quit 04/06/12    Past Surgical History  Procedure Laterality Date  . Hernia repair    . Left heart catheterization with coronary angiogram N/A 04/11/2012    Procedure: LEFT HEART CATHETERIZATION WITH CORONARY ANGIOGRAM; Surgeon: Burnell Blanks, MD; Location: [facility] CATH LAB; Service: Cardiovascular; Laterality: N/A;  . Percutaneous coronary stent intervention (pci-s)  04/11/2012    Procedure: PERCUTANEOUS CORONARY STENT INTERVENTION (PCI-S); Surgeon: Burnell Blanks, MD; Location: [facility] CATH LAB; Service: Cardiovascular;; Distal RCA  . Percutaneous coronary intervention-balloon only  04/11/2012    Procedure: PERCUTANEOUS CORONARY INTERVENTION-BALLOON ONLY; Surgeon: Burnell Blanks, MD; Location: [facility] CATH LAB; Service: Cardiovascular;; RPLA    Family History  Problem Relation Age of Onset  . Cancer Mother   . Heart attack Father   . Hypertension Mother   .  Hypertension Father   . Hypertension Sister   . Hypertension Brother   .  Stroke Sister      Social History: Lives with sister. Independent PTA. He used to work in [employer] in [place] been disabled since 2008 due to [place]. Sister getting GED at [facility]. Sister and brother can provide assistance after discharge. He reports that he has been smoking Cigarettes.--about 1/2-1/3 PPD. He has a 40 pack-year smoking history. He has never used smokeless tobacco. He reports that he drinks alcohol--3 beers and 3 shots of liquor daily. He reports that he uses marijuana daily.    Allergies  Allergen Reactions  . Lisinopril Swelling and Other (See Comments)    angioedema    Medications Prior to Admission  Medication Sig Dispense Refill  . aspirin 81 MG chewable tablet Chew 1 tablet (81 mg total) by mouth daily.    [name] atorvastatin (LIPITOR) 80 MG tablet Take 1 tablet (80 mg total) by mouth daily with breakfast. 90 tablet 3  . carvedilol (COREG) 6.25 MG tablet Take 1 tablet (6.25 mg total) by mouth 2 (two) times daily with a meal. (Patient taking differently: Take 6.25 mg by mouth daily. ) 180 tablet 3  . clopidogrel (PLAVIX) 75 MG tablet Take 1 tablet (75 mg total) by mouth daily. 90 tablet 3  . furosemide (LASIX) 20 MG tablet Take 1 tablet (20 mg total) by mouth daily. 90 tablet 3  . losartan (COZAAR) 25 MG tablet Take 1 tablet (25 mg total) by mouth daily. 90 tablet 3  . meloxicam (MOBIC) 7.5 MG tablet Take 15 mg by mouth daily.     . nitroGLYCERIN (NITROSTAT) 0.4 MG SL tablet Place 1 tablet (0.4 mg total) under the tongue every 5 (five) minutes as needed for chest pain (up to 3 doses). 25 tablet 3    Home: Home Living Family/patient expects to be discharged to:: Private residence Living Arrangements: Other (Comment) (Lives with his sister Harolyn Rutherford, [place] GF across the street, s) Available Help at Discharge: Family, Available 24 hours/day, Other (Comment) (sister in school to get GED, plans to take leave to  provide ) Type of Home: House Home Access: Stairs to enter [employer] of Steps: 4 Entrance Stairs-Rails: Right, Left, Can reach both Home Layout: One level Bathroom Shower/Tub: Tub/shower unit, Architectural technologist: Standard Bathroom Accessibility: Yes Home Equipment: None Additional Comments: drives, was drinking 3 to 6 beers daily and smokes pack cigs every two days Lives With: Family, Other (Comment) (sister, Harolyn Rutherford)  Functional History: Prior Function Level of Independence: Independent Comments: drives  Functional Status:  Mobility: Bed Mobility Overal bed mobility: Needs Assistance Bed Mobility: Supine to Sit Rolling: Max assist Supine to sit: Min assist General bed mobility comments: With increased time pt able to complete mroe of bed mobility. A needed for scooting L hip closer to EOB.  Transfers Overall transfer level: Needs assistance Equipment used: 1 person hand held assist Transfers: Sit to/from Stand, Stand Pivot Transfers Sit to Stand: Mod assist, +2 safety/equipment Stand pivot transfers: Mod assist, +2 safety/equipment General transfer comment: pt able to complete transfer with one person A, but 2nd person present for line management and A with hygiene after using commode.  Ambulation/Gait Ambulation/Gait assistance: Mod assist, +2 safety/equipment Ambulation Distance (Feet): 5 Feet Assistive device: 1 person hand held assist Gait Pattern/deviations: Step-to pattern, Decreased step length - left, Decreased stance time - left, Decreased dorsiflexion - left General Gait Details: pt needs cues for correcting L lateral  lean and extending L LE in stance. 2nd person present to manage lines and chairs.     ADL: ADL Overall ADL's : Needs assistance/impaired Eating/Feeding: Minimal assistance, Sitting Grooming: Wash/dry face, Sitting, Minimal assistance Upper Body Bathing: Sitting, Maximal assistance Lower Body Bathing: Sit to/from  stand, Maximal assistance, +2 for physical assistance Upper Body Dressing : Maximal assistance, Sitting Lower Body Dressing: +2 for physical assistance, Minimal assistance, Sit to/from stand Toilet Transfer: +2 for physical assistance, Minimal assistance, Stand-pivot, BSC Toileting- Clothing Manipulation and Hygiene: +2 for safety/equipment, Maximal assistance, Sit to/from stand  Cognition: Cognition Overall Cognitive Status: Impaired/Different from baseline Arousal/Alertness: Awake/alert Orientation Level: Oriented X4 Attention: Sustained Sustained Attention: Impaired Sustained Attention Impairment: Functional basic Memory: Appears intact Awareness: Impaired Awareness Impairment: Intellectual impairment Problem Solving: Impaired Problem Solving Impairment: Verbal complex, Functional complex Executive Function: Reasoning Reasoning: Impaired Reasoning Impairment: Verbal complex, Functional complex Behaviors: (flat affect) Cognition Arousal/Alertness: Awake/alert Behavior During Therapy: Flat affect Overall Cognitive Status: Impaired/Different from baseline Area of Impairment: Attention, Safety/judgement, Awareness, Problem solving Current Attention Level: Selective Safety/Judgement: Decreased awareness of safety, Decreased awareness of deficits Awareness: Emergent Problem Solving: Slow processing, Decreased initiation, Difficulty sequencing, Requires verbal cues, Requires tactile cues General Comments: pt continues with L sided neglect and needing cues for attending to L side.   Blood pressure 154/108, pulse 57, temperature 97.9 F (36.6 C), temperature source Oral, resp. rate 30, height '5\' 5"'  (1.651 m), weight 71.1 kg (156 lb 12 oz), SpO2 100 %. Physical Exam  Nursing note and vitals reviewed. Constitutional: He is oriented to person, place, and time. He appears well-developed and well-nourished.  HENT:  Head: Normocephalic and atraumatic.  Eyes: Conjunctivae are normal.  Pupils are equal, round, and reactive to light. Right eye exhibits no discharge. Left eye exhibits no discharge.  Neck: Normal range of motion. Neck supple.  Cardiovascular: Normal rate and regular rhythm.  Respiratory: Effort normal and breath sounds normal. No respiratory distress. He has no wheezes.  GI: Soft. Bowel sounds are normal. He exhibits no distension. There is no tenderness.  Musculoskeletal: He exhibits no edema or tenderness.  R- distal biceps with painful and indurated area. Hard cord in right antecubital fossa likely due to thrombosed vein  Neurological: He is alert and oriented to person, place, and time. Coordination abnormal.  Left facial weakness with mild dysarthria and has occasional wet voice. Tongue with deviation to left. Able to follow basic one and two step motor commands. Sensation intact. Has mild right inattention but scans to right without difficulty. RUE 4/5 delt, 3- Biceps due to pain 5/5 triceps and grip 0/5 LUE 4/5 L KE, 3/5 L HF and 3- ADF 5/5 in RLE Sensation intact BUE and BLE  Skin: Skin is warm and dry. There is erythema.     Lab Results Last 48 Hours    Results for orders placed or performed during the hospital encounter of 04/04/15 (from the past 48 hour(s))  Sodium Status: Abnormal   Collection Time: 04/08/15 12:23 PM  Result Value Ref Range   Sodium 148 (H) 135 - 145 mmol/L  Sodium Status: Abnormal   Collection Time: 04/08/15 6:35 PM  Result Value Ref Range   Sodium 148 (H) 135 - 145 mmol/L  Sodium Status: Abnormal   Collection Time: 04/09/15 12:00 AM  Result Value Ref Range   Sodium 146 (H) 135 - 145 mmol/L  CBC Status: Abnormal   Collection Time: 04/09/15 6:00 AM  Result Value Ref Range   WBC 7.7 4.0 -  10.5 K/uL   RBC 4.09 (L) 4.22 - 5.81 MIL/uL   Hemoglobin 11.3 (L) 13.0 - 17.0 g/dL   HCT 36.4 (L) 39.0 - 52.0 %   MCV 89.0 78.0 - 100.0 fL   MCH 27.6  26.0 - 34.0 pg   MCHC 31.0 30.0 - 36.0 g/dL   RDW 15.0 11.5 - 15.5 %   Platelets 137 (L) 150 - 400 K/uL  Basic metabolic panel Status: Abnormal   Collection Time: 04/09/15 6:00 AM  Result Value Ref Range   Sodium 148 (H) 135 - 145 mmol/L   Potassium 3.3 (L) 3.5 - 5.1 mmol/L   Chloride 118 (H) 101 - 111 mmol/L   CO2 23 22 - 32 mmol/L   Glucose, Bld 155 (H) 65 - 99 mg/dL   BUN 5 (L) 6 - 20 mg/dL   Creatinine, Ser 0.79 0.61 - 1.24 mg/dL   Calcium 9.0 8.9 - 10.3 mg/dL   GFR calc non Af Amer >60 >60 mL/min   GFR calc Af Amer >60 >60 mL/min    Comment: (NOTE) The eGFR has been calculated using the CKD EPI equation. This calculation has not been validated in all clinical situations. eGFR's persistently <60 mL/min signify possible Chronic Kidney Disease.    Anion gap 7 5 - 15  Sodium Status: Abnormal   Collection Time: 04/09/15 5:25 PM  Result Value Ref Range   Sodium 147 (H) 135 - 145 mmol/L  CBC Status: Abnormal   Collection Time: 04/10/15 6:56 AM  Result Value Ref Range   WBC 5.9 4.0 - 10.5 K/uL   RBC 3.96 (L) 4.22 - 5.81 MIL/uL   Hemoglobin 11.0 (L) 13.0 - 17.0 g/dL   HCT 34.9 (L) 39.0 - 52.0 %   MCV 88.1 78.0 - 100.0 fL   MCH 27.8 26.0 - 34.0 pg   MCHC 31.5 30.0 - 36.0 g/dL   RDW 14.9 11.5 - 15.5 %   Platelets 134 (L) 150 - 400 K/uL  Basic metabolic panel Status: Abnormal   Collection Time: 04/10/15 6:56 AM  Result Value Ref Range   Sodium 145 135 - 145 mmol/L   Potassium 3.4 (L) 3.5 - 5.1 mmol/L   Chloride 113 (H) 101 - 111 mmol/L   CO2 26 22 - 32 mmol/L   Glucose, Bld 97 65 - 99 mg/dL   BUN 8 6 - 20 mg/dL   Creatinine, Ser 0.86 0.61 - 1.24 mg/dL   Calcium 9.1 8.9 - 10.3 mg/dL   GFR calc non Af Amer >60 >60 mL/min   GFR calc Af Amer >60 >60 mL/min    Comment: (NOTE) The eGFR has  been calculated using the CKD EPI equation. This calculation has not been validated in all clinical situations. eGFR's persistently <60 mL/min signify possible Chronic Kidney Disease.    Anion gap 6 5 - 15      Imaging Results (Last 48 hours)    Ct Head Wo Contrast  04/09/2015 CLINICAL DATA: 64 year old male with right hemisphere intra-axial hemorrhage discovered following presentation of left side paralysis. Initial encounter. EXAM: CT HEAD WITHOUT CONTRAST TECHNIQUE: Contiguous axial images were obtained from the base of the skull through the vertex without intravenous contrast. COMPARISON: 04/07/2015 through 04/04/2015, 04/02/2012 FINDINGS: Stable visualized osseous structures. Visualized paranasal sinuses and mastoids are clear. Rightward gaze deviation. No acute orbit or scalp soft tissue findings. Calcified atherosclerosis at the skull base. Basilar cisterns remain patent. No ventriculomegaly. Stable posterior fossa gray-white matter differentiation. Curvilinear intra-axial hemorrhage occupying the right  frontal lobe, epicenter anterior superior right frontal lobe re- demonstrated. Hyperdense blood products have not significantly changed in size or configuration, encompassing up to 54 x 51 mm AP by transverse (57 x 49 mm at a comparable level previously. Surrounding edema has increased since 04/05/2015. Regional mass effect has mildly increased, now with anterior midline shift of 5-6 mm (3-4 mm previously). As before, little to no extra-axial extension of hemorrhage (questionable trace subarachnoid extension near the midline on series 21, image 16). No intraventricular extension. Stable left hemisphere gray-white matter differentiation. IMPRESSION: 1. No significant change in large right frontal lobe intra-axial hemorrhage since 04/07/2015. Estimated hematoma volume 83 mL. 2. Mildly increased surrounding edema and regional mass effect. Leftward anterior midline shift now 6 mm, previously 3-4  mm. No ventriculomegaly and basilar cisterns remain patent. 3. Little to no extra-axial extension of blood (questionable trace subarachnoid extension on image 16 today). 4. No new intracranial abnormality. Electronically Signed By: Genevie Ann M.D. On: 04/09/2015 13:27        Medical Problem List and Plan: 1. Left hemiparesis, gait disorder and dysphargia secondary to Right frontal hypertensive ICH 2. DVT Prophylaxis/Anticoagulation: Pharmaceutical: Heparin, prophyllactic dose, local care for RUE DVT 3. Pain Management: Tylenol prn for pain.  4. Mood: LCSW to follow for evaluation and support.  5. Neuropsych: This patient is capable of making decisions on his own behalf. 6. Skin/Wound Care: Routine pressure relief measures 7. Fluids/Electrolytes/Nutrition: Monitor I/O. Offer supplements between meals as intake poor. Check lytes in am. 8. Malignant HTN: Monitor BP every 6 hours as DBP ranging 90- 120. Continue coreg bid and cozaar 9. CAD: Monitor for symptoms with increase in activity level. Continue coreg and cozaar. ASA/Plavix discontinued. Lipitor on hold due to bleed.  10. ABLA: Monitor for now. Check CBC in am. 11 Hypokalemia: Likely due to IVF. Will supplement today and check lytes in am.       Post Admission Physician Evaluation: 1. Functional deficits secondary to Left hemiparesis, gait disorder and dysphargia secondary to Right frontal hypertensive ICH. 2. Patient is admitted to receive collaborative, interdisciplinary care between the physiatrist, rehab nursing staff, and therapy team. 3. Patient's level of medical complexity and substantial therapy needs in context of that medical necessity cannot be provided at a lesser intensity of care such as a SNF. 4. Patient has experienced substantial functional loss from his/her baseline which was documented above under the "Functional History" and "Functional Status" headings. Judging by the patient's diagnosis, physical exam,  and functional history, the patient has potential for functional progress which will result in measurable gains while on inpatient rehab. These gains will be of substantial and practical use upon discharge in facilitating mobility and self-care at the household level. 5. Physiatrist will provide 24 hour management of medical needs as well as oversight of the therapy plan/treatment and provide guidance as appropriate regarding the interaction of the two. 6. 24 hour rehab nursing will assist with bladder management, bowel management, safety, skin/wound care, disease management, medication administration, pain management and patient education and help integrate therapy concepts, techniques,education, etc. 7. PT will assess and treat for/with: pre gait, gait training, endurance , safety, equipment, neuromuscular re education. Goals are: Sup. 8. OT will assess and treat for/with: ADLs, Cognitive perceptual skills, Neuromuscular re education, safety, endurance, equipment. Goals are: Mod I/Sup. Therapy may proceed with showering this patient. 9. SLP will assess and treat for/with: cognition and swallowing. Goals are: Safe po intake. 10. Case Management and Social Worker will assess and  treat for psychological issues and discharge planning. 11. Team conference will be held weekly to assess progress toward goals and to determine barriers to discharge. 12. Patient will receive at least 3 hours of therapy per day at least 5 days per week. 13. ELOS: 10-14days  14. Prognosis: excellent     Charlett Blake M.D. [facility] FAAPM&R (Sports Med, Neuromuscular Med) Diplomate [place] Board of Electrodiagnostic Med  04/10/2015

## 2015-04-11 NOTE — Progress Notes (Signed)
Patient information reviewed and entered into eRehab system by Sharita Bienaime, RN, CRRN, PPS Coordinator.  Information including medical coding and functional independence measure will be reviewed and updated through discharge.     Per nursing patient was given "Data Collection Information Summary for Patients in Inpatient Rehabilitation Facilities with attached "Privacy Act Statement-Health Care Records" upon admission.  Handout present in education notebook.  

## 2015-04-11 NOTE — Interval H&P Note (Signed)
Randall Patel was admitted today to Inpatient Rehabilitation with the diagnosis of RIght frontal hypertensive ICH with Left hemiparesis.  The patient's history has been reviewed, patient examined, and there is no change in status.  Patient continues to be appropriate for intensive inpatient rehabilitation.  I have reviewed the patient's chart and labs.  Questions were answered to the patient's satisfaction. The PAPE has been reviewed and assessment remains appropriate.  Erick Colace 04/11/2015, 6:47 AM

## 2015-04-11 NOTE — Evaluation (Signed)
Physical Therapy Assessment and Plan  Patient Details  Name: Randall Patel MRN: [id number] Date of Birth: June 18, 1950  PT Diagnosis: Abnormal posture, Difficulty walking, Hemiparesis non-dominant, Hypotonia, Impaired cognition, Muscle weakness and Paralysis Rehab Potential: Good ELOS: 14-17 days   Today's Date: 04/11/2015 PT Individual Time: 1030-1200 PT Individual Time Calculation (min): 90 min    Problem List:  Patient Active Problem List   Diagnosis Date Noted  . Hemiparesis affecting left side as late effect of stroke ([place])   . Dysphagia as late effect of cerebrovascular disease   . Essential hypertension   . Acute blood loss anemia   . 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 Dysphagia   . Dysarthria due to cerebrovascular accident   . Diastolic dysfunction   . Tobacco abuse   . ETOH abuse   . History of CVA with residual deficit   . Coronary artery disease involving native coronary artery of native heart with angina pectoris ([place])   . Tachypnea   . Hypernatremia   . Hypokalemia   . Thrombocytopenia ([place])   . Intracranial hemorrhage ([place])   . Essential hypertension, malignant   . ICH (intracerebral hemorrhage) ([place]) 04/04/2015  . STEMI (ST elevation myocardial infarction) ([place]) 04/14/2012  . NSTEMI (non-ST elevated myocardial infarction) ([place]) 04/14/2012  . Cardiomyopathy, ischemic 04/14/2012  . Cerebral embolism with cerebral infarction ([place]) 04/03/2012  . Unspecified transient cerebral ischemia 04/02/2012  . Hemiplegia, unspecified, affecting nondominant side 04/02/2012  . CAD (coronary artery disease) s/p stent 2008 in [place] 04/02/2012  . Hypertension     Past Medical History:  Past Medical History  Diagnosis Date  . Hypertension   . Coronary artery disease     MI in 2008 with PCI to ? vessel; NSTEMI 03/2012; NSTEMI 04/2012 Cath showed triple vessel dz including occluded RCA, s/p DES to distal RCA, PTCA posterolateral branch    . CVA (cerebral infarction)     03/2012 with left arm weakness/numbness and left facial droop.  . Ischemic cardiomyopathy     04/2012 EF 35-40%  . Hyperlipidemia   . Claudication ([place])   . Tobacco abuse     quit 04/06/12  . ETOH abuse     quit 04/06/12  . Stroke [facility])    Past Surgical History:  Past Surgical History  Procedure Laterality Date  . Hernia repair    . Left heart catheterization with coronary angiogram N/A 04/11/2012    Procedure: LEFT HEART CATHETERIZATION WITH CORONARY ANGIOGRAM;  Surgeon: Burnell Blanks, MD;  Location: [facility] CATH LAB;  Service: Cardiovascular;  Laterality: N/A;  . Percutaneous coronary stent intervention (pci-s)  04/11/2012    Procedure: PERCUTANEOUS CORONARY STENT INTERVENTION (PCI-S);  Surgeon: Burnell Blanks, MD;  Location: [facility] CATH LAB;  Service: Cardiovascular;;  Distal RCA  . Percutaneous coronary intervention-balloon only  04/11/2012    Procedure: PERCUTANEOUS CORONARY INTERVENTION-BALLOON ONLY;  Surgeon: Burnell Blanks, MD;  Location[facility] CATH LAB;  Service: Cardiovascular;;  RPLA    Assessment & Plan Clinical Impression: Patient is a 64 y.o. year old male with history of HTN, CAD with ICM, polysubstance abuse, 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 UDS positive for THC. CT head done revealing large right frontoparietal ICH and he was started on cardene drip and IV mannitol. Chronic ASA/plavix discontinued and Dr. [name] recommended conservative management with serial CCT. 2D echo done  revealing EF 40-45% with moderate LVH and mild MVR. Repeat CT head with increase in midline shift and mass effect and mannitol changed to hypertonic saline with goal 150-155.  Patient has had confusion with fluctuating bouts of lethargy. Carotid dopplers without significant ICA stenosis. MBS done 12/27 and patient was placed on dysphagia 1,  nectar liquids. Dr. Erlinda Hong feels that patient had R-ICH with cerebral edema in setting of HTN and to consider outpatient 30 day cardiac event monitor to rule out embolic source. Hypertonic saline d/c yesterday and sodium normalizing. Blood pressures remain labile and po intake is poor. Patient with resultant LUE > LLE weakness, balance deficits, left inattention, poor safety awareness and dysphagia.Right upper ext pain and swelling evaluated today. Evidence of R cephalic but no axillary DVT. No anticoagulation recommended CIR recommended by MD and rehab team for follow up therapy.  Patient transferred to CIR on 04/10/2015 .   Patient currently requires mod to max assist with mobility secondary to muscle weakness and muscle paralysis, decreased cardiorespiratoy endurance, abnormal tone, unbalanced muscle activation, decreased coordination and decreased motor planning, decreased midline orientation and decreased attention to left, decreased initiation, decreased attention and decreased awareness and decreased sitting balance, decreased standing balance, decreased postural control, hemiplegia and decreased balance strategies.  Prior to hospitalization, patient was independent  with mobility and lived with Family (sister) in a House home.  Home access is 4Stairs to enter.  Patient will benefit from skilled PT intervention to maximize safe functional mobility, minimize fall risk and decrease caregiver burden for planned discharge home with 24 hour supervision.  Anticipate patient will benefit from follow up [place] at discharge.  PT - End of Session Activity Tolerance: Decreased this session Endurance Deficit: Yes PT Assessment Rehab Potential (ACUTE/IP ONLY): Good PT Patient demonstrates impairments in the following area(s): Balance;Behavior;Endurance;Motor;Perception;Safety;Sensory PT Transfers Functional Problem(s): Bed to Chair;Bed Mobility;Car;Furniture PT Locomotion Functional Problem(s):  Ambulation;Stairs PT Plan PT Intensity: Minimum of 1-2 x/day ,45 to 90 minutes PT Frequency: 5 out of 7 days PT Duration Estimated Length of Stay: 14-17 days PT Treatment/Interventions: Ambulation/gait training;Balance/vestibular training;Cognitive remediation/compensation;Community reintegration;Discharge planning;Disease management/prevention;DME/adaptive equipment instruction;Functional mobility training;Neuromuscular re-education;Pain management;Patient/family education;Psychosocial support;Splinting/orthotics;Stair training;Therapeutic Activities;Therapeutic Exercise;UE/LE Strength taining/ROM;UE/LE Coordination activities;Visual/perceptual remediation/compensation;Wheelchair propulsion/positioning PT Transfers Anticipated Outcome(s): supervision overall PT Locomotion Anticipated Outcome(s): supervision gait; min assist stairs PT Recommendation Follow Up Recommendations: Home health PT;24 hour supervision/assistance Patient destination: Home Equipment Recommended: To be determined  Skilled Therapeutic Intervention Individual treatment initiated with focus on PT evaluation, education on goals and PT POC, education, and functional mobility training including transfers, balance, gait, and neuro re-ed for postural control, reorientation to midline and attention to L. Pt performed various transfers including from recliner, w/c, mat, simulated car, and toilet with cues for techniques, hand placement, initiation, and safety. Pt with L inattention requiring up to max verbal cues needed. Neuro re-ed on Nustep with BUE and BLE for reciprocal movement pattern re-training, endurance, and overall muscle activation (using LUE hand support) x 10 min on level 4. Pt with decreased overall awareness into deficits and path to recovery - education provided throughout. Gait without AD with difficulty initiating steps (up to max assist) but then progressing to near min assist level in straight path; difficulty with  obstacle negotiation or pathfinding during mobility noted.    PT Evaluation Precautions/Restrictions Precautions Precautions: Fall Precaution Comments: L hemiparesis, L inattention Restrictions Weight Bearing Restrictions: No Pain  Denies pain Home Living/Prior Functioning Home Living Available Help at Discharge: Family;Available 24 hours/day (sister in school but  pt reports she will be there at d/c) Type of Home: House Home Access: Stairs to enter [employer] of Steps: 4 Entrance Stairs-Rails: Right;Left;Can reach both Home Layout: One level  Lives With: Family (sister) Prior Function Level of Independence: Independent with basic ADLs;Independent with homemaking with ambulation;Independent with gait;Independent with transfers  Able to Take Stairs?: Yes Driving: Yes Vocation: On disability Vision/Perception  Perception Perception: Impaired Inattention/Neglect: Does not attend to left visual field;Does not attend to left side of body Comments: inattention; with max cues able to attend to the L  Cognition Overall Cognitive Status: Impaired/Different from baseline Arousal/Alertness: Awake/alert Orientation Level: Oriented X4 Awareness: Impaired Awareness Impairment: Intellectual impairment Executive Function: Initiating;Reasoning;Self Monitoring;Self Correcting Initiating: Impaired Initiating Impairment: Functional basic Self Monitoring: Impaired Self Monitoring Impairment: Functional basic Behaviors: Lability Safety/Judgment: Impaired Comments: Pt breaking down in tears several times during evaluation Sensation Sensation Light Touch: Appears Intact Proprioception: Impaired Detail Proprioception Impaired Details: Impaired LUE Coordination Gross Motor Movements are Fluid and Coordinated: No Motor  Motor Motor: Hemiplegia;Abnormal tone;Abnormal postural alignment and control Motor - Skilled Clinical Observations: L hemiparesis      Trunk/Postural  Assessment  Cervical Assessment Cervical Assessment: Exceptions to [facility] (pt maintains head turned to R) Thoracic Assessment Thoracic Assessment: Within Functional Limits (slightly flexed posture) Lumbar Assessment Lumbar Assessment: Within Functional Limits (sits with posterior tilt) Postural Control Postural Control: Deficits on evaluation  Balance Balance Balance Assessed: Yes Static Sitting Balance Static Sitting - Level of Assistance: 5: Stand by assistance Dynamic Sitting Balance Dynamic Sitting - Level of Assistance: 4: Min assist Static Standing Balance Static Standing - Level of Assistance: 3: Mod assist;4: Min assist Dynamic Standing Balance Dynamic Standing - Level of Assistance: 3: Mod assist;2: Max assist Extremity Assessment   See OT evaluation for details of UE assessment   RLE Assessment RLE Assessment: Within Functional Limits LLE Assessment LLE Assessment: Exceptions to [facility] LLE Strength LLE Overall Strength Comments: grossly 3-- to 3/5   See Function Navigator for Current Functional Status.   Refer to Care Plan for Long Term Goals  Recommendations for other services: None  Discharge Criteria: Patient will be discharged from PT if patient refuses treatment 3 consecutive times without medical reason, if treatment goals not met, if there is a change in medical status, if patient makes no progress towards goals or if patient is discharged from hospital.  The above assessment, treatment plan, treatment alternatives and goals were discussed and mutually agreed upon: by patient  Juanna Cao, PT, DPT  04/11/2015, 12:21 PM

## 2015-04-11 NOTE — Care Management Note (Signed)
Inpatient Rehabilitation Center Individual Statement of Services  Patient Name:  Randall Patel  Date:  04/11/2015  Welcome to the Inpatient Rehabilitation Center.  Our goal is to provide you with an individualized program based on your diagnosis and situation, designed to meet your specific needs.  With this comprehensive rehabilitation program, you will be expected to participate in at least 3 hours of rehabilitation therapies Monday-Friday, with modified therapy programming on the weekends.  Your rehabilitation program will include the following services:  Physical Therapy (PT), Occupational Therapy (OT), Speech Therapy (ST), 24 hour per day rehabilitation nursing, Therapeutic Recreaction (TR), Neuropsychology, Case Management (Social Worker), Rehabilitation Medicine, Nutrition Services and Pharmacy Services  Weekly team conferences will be held on Tuesdays to discuss your progress.  Your Social Worker will talk with you frequently to get your input and to update you on team discussions.  Team conferences with you and your family in attendance may also be held.  Expected length of stay: 14-17 days  Overall anticipated outcome: supervision  Depending on your progress and recovery, your program may change. Your Social Worker will coordinate services and will keep you informed of any changes. Your Social Worker's name and contact numbers are listed  below.  The following services may also be recommended but are not provided by the Inpatient Rehabilitation Center:   Driving Evaluations  Home Health Rehabiltiation Services  Outpatient Rehabilitation Services    Arrangements will be made to provide these services after discharge if needed.  Arrangements include referral to agencies that provide these services.  Your insurance has been verified to be:  Medicare and Medicaid Your primary doctor is:  Dr. Bruna Potter  Pertinent information will be shared with your doctor and your insurance  company.  Social Worker:  [place], [place] [phone] or ([phone]   Information discussed with and copy given to patient by: Amada Jupiter, 04/11/2015, 2:53 PM

## 2015-04-11 NOTE — Progress Notes (Signed)
This RN cared for pt between 1100 and 1500. Agree with prior RN assessment  

## 2015-04-11 NOTE — Progress Notes (Signed)
[facility] PHYSICAL MEDICINE & REHABILITATION     PROGRESS NOTE    Subjective/Complaints:  Pt seen this AM resting in bed.  He states he had a good first night in rehab.  ROS: Denies CP, SOB, n/v/d.   Objective: Vital Signs: Blood pressure 180/102, pulse 60, temperature 98.3 F (36.8 C), temperature source Oral, resp. rate 18, height 5\' 5"  (1.651 m), weight 73.6 kg (162 lb 4.1 oz), SpO2 100 %. Ct Head Wo Contrast  04/09/2015  CLINICAL DATA:  64 year old male with right hemisphere intra-axial hemorrhage discovered following presentation of left side paralysis. Initial encounter. EXAM: CT HEAD WITHOUT CONTRAST TECHNIQUE: Contiguous axial images were obtained from the base of the skull through the vertex without intravenous contrast. COMPARISON:  04/07/2015 through 04/04/2015, 04/02/2012 FINDINGS: Stable visualized osseous structures. Visualized paranasal sinuses and mastoids are clear. Rightward gaze deviation. No acute orbit or scalp soft tissue findings. Calcified atherosclerosis at the skull base. Basilar cisterns remain patent. No ventriculomegaly. Stable posterior fossa gray-white matter differentiation. Curvilinear intra-axial hemorrhage occupying the right frontal lobe, epicenter anterior superior right frontal lobe re- demonstrated. Hyperdense blood products have not significantly changed in size or configuration, encompassing up to 54 x 51 mm AP by transverse (57 x 49 mm at a comparable level previously. Surrounding edema has increased since 04/05/2015. Regional mass effect has mildly increased, now with anterior midline shift of 5-6 mm (3-4 mm previously). As before, little to no extra-axial extension of hemorrhage (questionable trace subarachnoid extension near the midline on series 21, image 16). No intraventricular extension. Stable left hemisphere gray-white matter differentiation. IMPRESSION: 1. No significant change in large right frontal lobe intra-axial hemorrhage since 04/07/2015.  Estimated hematoma volume 83 mL. 2. Mildly increased surrounding edema and regional mass effect. Leftward anterior midline shift now 6 mm, previously 3-4 mm. No ventriculomegaly and basilar cisterns remain patent. 3. Little to no extra-axial extension of blood (questionable trace subarachnoid extension on image 16 today). 4. No new intracranial abnormality. Electronically Signed   By: Odessa Fleming M.D.   On: 04/09/2015 13:27    Recent Labs  04/10/15 0656 04/11/15 0436  WBC 5.9 4.8  HGB 11.0* 11.6*  HCT 34.9* 36.1*  PLT 134* 169    Recent Labs  04/10/15 0656 04/11/15 0436  NA 145 143  K 3.4* 3.4*  CL 113* 107  GLUCOSE 97 104*  BUN 8 8  CREATININE 0.86 0.80  CALCIUM 9.1 9.2   CBG (last 3)  No results for input(s): GLUCAP in the last 72 hours.  Wt Readings from Last 3 Encounters:  04/10/15 73.6 kg (162 lb 4.1 oz)  04/04/15 71.1 kg (156 lb 12 oz)  02/20/15 74.844 kg (165 lb)    Physical Exam:  BP 180/102 mmHg  Pulse 60  Temp(Src) 98.3 F (36.8 C) (Oral)  Resp 18  Ht 5\' 5"  (1.651 m)  Wt 73.6 kg (162 lb 4.1 oz)  BMI 27.00 kg/m2  SpO2 100% Constitutional: He appears well-developed and well-nourished. Vitals reviewed. HENT: Normocephalic and atraumatic.  Eyes: Conjunctivae and EOM are normal.  Neck: Normal range of motion. Neck supple.  Cardiovascular: Normal rate and regular rhythm.  Respiratory: Effort normal and breath sounds normal. No respiratory distress. He has no wheezes.  GI: Soft. Bowel sounds are normal. He exhibits no distension. There is no tenderness.  Musculoskeletal: He exhibits no edema or tenderness.  Neurological: He is alert and oriented. Coordination abnormal.  Left facial weakness with mild dysarthria.  Able to follow basic one and  two step motor commands.  Has mild right inattention but scans to right without difficulty. RUE 4/5 delt, 4/5 Biceps due to pain 5/5 triceps and grip 0/5 LUE 4+/5 L KE, 3/5 L HF and 3- ADF 5/5 in RLE Sensation  intact BUE and BLE  Skin: Skin is warm and dry. There is erythema.   Assessment/Plan: 1. Functional deficits secondary to Right frontal hypertensive ICH which require 3+ hours per day of interdisciplinary therapy in a comprehensive inpatient rehab setting. Physiatrist is providing close team supervision and 24 hour management of active medical problems listed below. Physiatrist and rehab team continue to assess barriers to discharge/monitor patient progress toward functional and medical goals.  Function:  Bathing Bathing position      Bathing parts      Bathing assist        Upper Body Dressing/Undressing Upper body dressing                    Upper body assist        Lower Body Dressing/Undressing Lower body dressing                                  Lower body assist        Toileting Toileting          Toileting assist     Transfers Chair/bed transfer             Locomotion Ambulation           Wheelchair          Cognition Comprehension    Expression    Social Interaction    Problem Solving    Memory      Medical Problem List and Plan: 1. 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 DVT Prophylaxis/Anticoagulation: Pharmaceutical: Heparin, prophyllactic dose, local care for RUE DVT 3. Pain Management: Tylenol prn for pain.  4. Mood: LCSW to follow for evaluation and support.  5. Neuropsych: This patient is capable of making decisions on his own behalf. 6. Skin/Wound Care: Routine pressure relief measures 7. Fluids/Electrolytes/Nutrition: Monitor I/O. Offer supplements between meals as intake poor. Check lytes in am. 8. Malignant HTN: Monitor BP every 6 hours as DBP ranging 90- 120.   Continue cozaar  Coreg increased to 12.5 on 12/30 9. CAD: Monitor for symptoms with increase in activity level. Continue coreg and cozaar. ASA/Plavix discontinued. Lipitor on  hold due to bleed.  10. ABLA: Monitor for now.   Hb 11.6 on 12/30 11 Hypokalemia: Likely due to IVF.   Supplemented on 12/30  LOS (Days) 1 A FACE TO FACE EVALUATION WAS PERFORMED  Ankit Karis J[place] 04/11/2015 9:04 AM

## 2015-04-11 NOTE — Progress Notes (Signed)
Social Work  Social Work Assessment and Plan  Patient Details  Name: Randall Patel MRN: [id number] Date of Birth: 07/11/1950  Today's Date: 04/11/2015  Problem List:  Patient Active Problem List   Diagnosis Date Noted  . Hemiparesis affecting left side as late effect of stroke (HCC)   . Dysphagia as late effect of cerebrovascular disease   . Essential hypertension   . Acute blood loss anemia   . 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 Dysphagia   . Dysarthria due to cerebrovascular accident   . Diastolic dysfunction   . Tobacco abuse   . ETOH abuse   . History of CVA with residual deficit   . Coronary artery disease involving native coronary artery of native heart with angina pectoris (HCC)   . Tachypnea   . Hypernatremia   . Hypokalemia   . Thrombocytopenia (HCC)   . Intracranial hemorrhage (HCC)   . Essential hypertension, malignant   . ICH (intracerebral hemorrhage) (HCC) 04/04/2015  . STEMI (ST elevation myocardial infarction) (HCC) 04/14/2012  . NSTEMI (non-ST elevated myocardial infarction) (HCC) 04/14/2012  . Cardiomyopathy, ischemic 04/14/2012  . Cerebral embolism with cerebral infarction (HCC) 04/03/2012  . Unspecified transient cerebral ischemia 04/02/2012  . Hemiplegia, unspecified, affecting nondominant side 04/02/2012  . CAD (coronary artery disease) s/p stent 2008 in [place] 04/02/2012  . Hypertension    Past Medical History:  Past Medical History  Diagnosis Date  . Hypertension   . Coronary artery disease     MI in 2008 with PCI to ? vessel; NSTEMI 03/2012; NSTEMI 04/2012 Cath showed triple vessel dz including occluded RCA, s/p DES to distal RCA, PTCA posterolateral branch   . CVA (cerebral infarction)     03/2012 with left arm weakness/numbness and left facial droop.  . Ischemic cardiomyopathy     04/2012 EF 35-40%  . Hyperlipidemia   . Claudication (HCC)   . Tobacco abuse     quit 04/06/12  . ETOH abuse     quit  04/06/12  . Stroke [facility])    Past Surgical History:  Past Surgical History  Procedure Laterality Date  . Hernia repair    . Left heart catheterization with coronary angiogram N/A 04/11/2012    Procedure: LEFT HEART CATHETERIZATION WITH CORONARY ANGIOGRAM;  Surgeon: Kathleene Hazel, MD;  Location: [facility] CATH LAB;  Service: Cardiovascular;  Laterality: N/A;  . Percutaneous coronary stent intervention (pci-s)  04/11/2012    Procedure: PERCUTANEOUS CORONARY STENT INTERVENTION (PCI-S);  Surgeon: Kathleene Hazel, MD;  Location: [facility] CATH LAB;  Service: Cardiovascular;;  Distal RCA  . Percutaneous coronary intervention-balloon only  04/11/2012    Procedure: PERCUTANEOUS CORONARY INTERVENTION-BALLOON ONLY;  Surgeon: Kathleene Hazel, MD;  Location: [facility] CATH LAB;  Service: Cardiovascular;;  RPLA   Social History:  reports that he has been smoking Cigarettes.  He has a 40 pack-year smoking history. He has never used smokeless tobacco. He reports that he drinks about 1.8 oz of alcohol per week. He reports that he uses illicit drugs (Marijuana).  Family / Support Systems Marital Status: Single Patient Roles: Parent, Other (Comment) (siblings) Children: son, Richy Didier - lives locally but not expected to be a primary support person. Other Supports: sister, [name] Chance (pt lives with her, having moved from [place] in 2011 due to financial issues) @ (H) [phone];  brother, Roert Culligan (local) @ (C) [phone] Anticipated Caregiver: sister and brother, Fayrene Fearing Ability/Limitations of Caregiver: none Caregiver Availability: 24/7  Family Dynamics: Pt describes good relationships with family members and denies any concerns about support available.  Social History Preferred language: English Religion: Baptist Cultural Background: NA Read: Yes Write: Yes Employment Status: Disabled Date Retired/Disabled/Unemployed: 2010 after 1st MI Legal Hisotry/Current Legal Issues:  None Guardian/Conservator: None - per MD, pt capable of making decisions on his own behalf   Abuse/Neglect Physical Abuse: Denies Verbal Abuse: Denies Sexual Abuse: Denies Exploitation of patient/patient's resources: Denies Self-Neglect: Denies  Emotional Status Pt's affect, behavior adn adjustment status: Pt offers very short, direct answers to assessment questions.  He is hopeful he will make good gains on CIR and denies any emotional distress about his current situation.  He does admit some frustration with his UE hemiplegia, however, plans to "do everything I can to make it better." Recent Psychosocial Issues: None Pyschiatric History: None Substance Abuse History: h/o ETOH abuse but quit 2013  Patient / Family Perceptions, Expectations & Goals Pt/Family understanding of illness & functional limitations: Pt with good understanding about his stroke and current functional limitations/ need for CIR. Premorbid pt/family roles/activities: Pt was independent PTA, however, sister does most of the driving. Anticipated changes in roles/activities/participation: Pt will likely have supervision / light assist needs and sister to assume primary caregiver role. Pt/family expectations/goals: "I just want to get better."  [employer]: None Premorbid Home Care/DME Agencies: Other (Comment) (cannot recall agency) Transportation available at discharge: yes Resource referrals recommended: Neuropsychology, Support group (specify)  Discharge Planning Living Arrangements: Other relatives Support Systems: Other relatives, Children Type of Residence: Private residence Insurance Resources: [employer], [place] (specify county) Architect: SSD, Family Support Financial Screen Referred: No Living Expenses: Lives with family Money Management: Patient Does the patient have any problems obtaining your medications?: No Home Management: pt and sister share  responsibilities Patient/Family Preliminary Plans: Pt to return home with his sister as primary support. Social Work Anticipated Follow Up Needs: HH/OP Expected length of stay: 14-17 days  Clinical Impression Unfortunate gentleman here following a stroke and known h/o prior CVA (2013) and MI.  Living with his sister who is able to provide 24/7 assistance.  Pt denies any emotional distress - will monitor.  He is hopeful to make a good recovery on CIR.  Will for support and d/c planning needs.  Jovannie Ulibarri 04/11/2015, 3:19 PM

## 2015-04-12 ENCOUNTER — Inpatient Hospital Stay (HOSPITAL_COMMUNITY): Payer: Medicare Other | Admitting: Speech Pathology

## 2015-04-12 ENCOUNTER — Inpatient Hospital Stay (HOSPITAL_COMMUNITY): Payer: Medicare Other | Admitting: Occupational Therapy

## 2015-04-12 ENCOUNTER — Inpatient Hospital Stay (HOSPITAL_COMMUNITY): Payer: Medicare Other

## 2015-04-12 MED ORDER — CARVEDILOL 25 MG PO TABS
25.0000 mg | ORAL_TABLET | Freq: Two times a day (BID) | ORAL | Status: DC
  Administered 2015-04-13 – 2015-04-25 (×25): 25 mg via ORAL
  Filled 2015-04-12 (×25): qty 1

## 2015-04-12 NOTE — Progress Notes (Signed)
Physical Therapy Session Note  Patient Details  Name: Randall Patel MRN: [id number] Date of Birth: 1951-02-12  Today's Date: 04/12/2015 PT Individual Time: 1300-1400 PT Individual Time Calculation (min): 60 min   Short Term Goals: Week 1:  PT Short Term Goal 1 (Week 1): Pt will be able to perform functional transfers with min assist level  PT Short Term Goal 2 (Week 1): Pt will be able to gait x 150' with min assist with LRAD PT Short Term Goal 3 (Week 1): Pt will be able to maintain midline positioning of trunk/head during functional activity x 2 min with mod verbal cues  Skilled Therapeutic Interventions/Progress Updates:   Session focused on gait, balance, transfers, endurance, L attention, safety awareness, problem solving, attention, and initiation. Pt able to perform various transfers througout session with overall min assist and verbal cues for technique and safety due to decreased awareness to L hemibody and L hemispace. Gait without AD with min assist today for balance and cues for posture, attention to obstacles on L, and gait pattern >150'. Neuro re-ed and cognitive task to match cards on board in front of patient (biased to the L side to force attention to L) in seated and standing positions to address balance, reorientation to midline, attention, and matching. Pt required mod to max verbal cues for task and supervision to mod assist for balance due to LOB posterior and to the L. Extra time for processing of all information and difficulty distinguishing the 6 and 9 cards on a few attempts. End of session changed out of pants and into shorts with assist for management on L side. Pt request to lay down and rest with noted difficulty sequencing how to return to supine position from sitting EOB requiring mod assist. Able to scoot up in bed with bridging technique through RLE and use of RUE on rail. All needs in reach and bed alarm on.  Therapy Documentation Precautions:   Precautions Precautions: Fall Precaution Comments: L hemiparesis, L inattention Restrictions Weight Bearing Restrictions: No    Pain:  Denies pain.   See Function Navigator for Current Functional Status.   Therapy/Group: Individual Therapy  Karolee Stamps Darrol Poke, PT, DPT  04/12/2015, 3:03 PM

## 2015-04-12 NOTE — Progress Notes (Signed)
[place] PHYSICAL MEDICINE & REHABILITATION     PROGRESS NOTE    Subjective/Complaints:  Pt seen this morning sleeping in bed.  He is easily arousable and states he slept well overnight.   ROS: Denies CP, SOB, n/v/d.   Objective: Vital Signs: Blood pressure 157/96, pulse 75, temperature 99.2 F (37.3 C), temperature source Oral, resp. rate 18, height  (1.651 m), weight 73.6 kg (162 lb 4.1 oz), SpO2 99 %. No results found.  Recent Labs  04/10/15 0656 04/11/15 0436  WBC 5.9 4.8  HGB 11.0* 11.6*  HCT 34.9* 36.1*  PLT 134* 169    Recent Labs  04/10/15 0656 04/11/15 0436  NA 145 143  K 3.4* 3.4*  CL 113* 107  GLUCOSE 97 104*  BUN 8 8  CREATININE 0.86 0.80  CALCIUM 9.1 9.2   CBG (last 3)  No results for input(s): GLUCAP in the last 72 hours.  Wt Readings from Last 3 Encounters:  04/10/15 73.6 kg (162 lb 4.1 oz)  04/04/15 71.1 kg (156 lb 12 oz)  02/20/15 74.844 kg (165 lb)    Physical Exam:  BP 157/96 mmHg  Pulse 75  Temp(Src) 99.2 F (37.3 C) (Oral)  Resp 18  Ht  (1.651 m)  Wt 73.6 kg (162 lb 4.1 oz)  BMI 27.00 kg/m2  SpO2 99% Constitutional: He appears well-developed and well-nourished. Vitals reviewed. HENT: Normocephalic and atraumatic.  Eyes: Conjunctivae and EOM are normal.  Neck: Normal range of motion. Neck supple.  Cardiovascular: Normal rate and regular rhythm.  Respiratory: Effort normal and breath sounds normal. No respiratory distress. He has no wheezes.  GI: Soft. Bowel sounds are normal. He exhibits no distension. There is no tenderness.  Musculoskeletal: He exhibits no edema or tenderness.  Neurological: He is alert and oriented. Coordination abnormal.  Left facial weakness with mild dysarthria.  Able to follow commands.  RUE 4/5 delt, 4/5 Biceps due to pain 5/5 triceps and grip 0/5 LUE 4+/5 L KE, 3/5 L HF and 3- ADF Antigravity strength in RLE Skin: Skin is warm and dry.   Assessment/Plan: 1. Functional  deficits secondary to Right frontal hypertensive ICH which require 3+ hours per day of interdisciplinary therapy in a comprehensive inpatient rehab setting. Physiatrist is providing close team supervision and 24 hour management of active medical problems listed below. Physiatrist and rehab team continue to assess barriers to discharge/monitor patient progress toward functional and medical goals.  Function:  Bathing Bathing position   Position: Sitting EOB  Bathing parts Body parts bathed by patient: Chest, Abdomen, Front perineal area, Right upper leg, Left upper leg Body parts bathed by helper: Right arm, Left arm, Buttocks, Right lower leg, Left lower leg, Back  Bathing assist Assist Level:  (mod A)      Upper Body Dressing/Undressing Upper body dressing   What is the patient wearing?: Pull over shirt/dress     Pull over shirt/dress - Perfomed by patient: Thread/unthread right sleeve Pull over shirt/dress - Perfomed by helper: Thread/unthread left sleeve, Put head through opening, Pull shirt over trunk        Upper body assist Assist Level:  (max A)      Lower Body Dressing/Undressing Lower body dressing   What is the patient wearing?: Underwear, Pants, Non-skid slipper socks Underwear - Performed by patient: Thread/unthread right underwear leg Underwear - Performed by helper: Thread/unthread left underwear leg, Pull underwear up/down Pants- Performed by patient: Thread/unthread right pants leg Pants- Performed by helper: Thread/unthread left  pants leg, Pull pants up/down, Fasten/unfasten pants   Non-skid slipper socks- Performed by helper: Don/doff right sock, Don/doff left sock                  Lower body assist Assist for lower body dressing:  (max A)      Toileting Toileting   Toileting steps completed by patient: Performs perineal hygiene Toileting steps completed by helper: Adjust clothing prior to toileting, Performs perineal hygiene, Adjust clothing after  toileting Toileting Assistive Devices: Grab bar or rail  Toileting assist Assist level:  (total)   Transfers Chair/bed transfer   Chair/bed transfer method: Stand pivot, Ambulatory Chair/bed transfer assist level: Touching or steadying assistance (Pt > 75%) Chair/bed transfer assistive device: Armrests     Locomotion Ambulation     Max distance: 150 Assist level: 2 helpers (+2 for safety; max progressing to min/mod)   Wheelchair       Assist Level: Dependent (Pt equals 0%)  Cognition Comprehension Comprehension assist level: Understands basic 90% of the time/cues < 10% of the time  Expression Expression assist level: Expresses basic 75 - 89% of the time/requires cueing 10 - 24% of the time. Needs helper to occlude trach/needs to repeat words.  Social Interaction Social Interaction assist level: Interacts appropriately 50 - 74% of the time - May be physically or verbally inappropriate.  Problem Solving Problem solving assist level: Solves basic 25 - 49% of the time - needs direction more than half the time to initiate, plan or complete simple activities  Memory Memory assist level: Recognizes or recalls 50 - 74% of the time/requires cueing 25 - 49% of the time    Medical Problem List and Plan: 1. 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 DVT Prophylaxis/Anticoagulation: Pharmaceutical: Heparin, prophyllactic dose, local care for RUE DVT 3. Pain Management: Tylenol prn for pain.  4. Mood: LCSW to follow for evaluation and support.  5. Neuropsych: This patient is capable of making decisions on his own behalf. 6. Skin/Wound Care: Routine pressure relief measures 7. Fluids/Electrolytes/Nutrition: Monitor I/O. Offer supplements between meals as intake poor. Check lytes in am. 8. Malignant HTN: Monitor BP every 6 hours as DBP ranging 90- 120.   Continue cozaar  Coreg increased to 25 on 12/31 9. CAD: Monitor for  symptoms with increase in activity level. Continue coreg and cozaar. ASA/Plavix discontinued. Lipitor on hold due to bleed.  10. ABLA: Monitor for now.   Hb 11.6 on 12/30 11 Hypokalemia: Likely due to IVF.   Supplemented on 12/30  LOS (Days) 2 A FACE TO FACE EVALUATION WAS PERFORMED  Randall Patel Randall Patel 04/12/2015 7:07 PM

## 2015-04-12 NOTE — Progress Notes (Signed)
Speech Language Pathology Daily Session Note  Patient Details  Name: Randall Patel MRN: [id number] Date of Birth: 03-28-1951  Today's Date: 04/12/2015 SLP Individual Time: 0725-0829 SLP Individual Time Calculation (min): 64 min  Short Term Goals: Week 1: SLP Short Term Goal 1 (Week 1): Pt will consume dys 1 textures and nectar thick liquids with mod assist verbal cues to monitor and correct anterior loss of boluses over 2 targeted sessions.   SLP Short Term Goal 2 (Week 1): Pt will consume trials of dys 2 textures with mod assist verbal cues to monitor and correct left sided buccal residue over 3 targeted sessions prior to advancement.   SLP Short Term Goal 3 (Week 1): Pt will consume therapeutic trials of regular, unthickened water over 2 targeted sessions with min assist verbal cues for use of safe swallowing precautions and minimal overt s/s of aspiration prior to repeat MBS  SLP Short Term Goal 4 (Week 1): Pt will attend to the left environment during basic, familiar tasks with max assist verbal cues.   SLP Short Term Goal 5 (Week 1): Pt will sustain his attention to a basic, familiar task for 3-5 minute intervals with mod assist verbal cues for redirection.    Skilled Therapeutic Interventions: Skilled treatment session focused on dysphagia and cognitive goals.  Upon arrival, patient was awake while supine in bed and was repositioned to sit upright in bed to maximize safety with PO intake. SLP facilitated session by providing set-up assist and Min A verbal cues for use of small bites, a slow rate of self-feeding and to self-monitor and correct left buccal pocketing. However, patient consumed small sips of nectar-thick liquids via cup and attended to anterior spillage with supervision verbal cues.  Patient consumed meal without overt s/s of aspiration. Patient also required Mod A verbal cues to attend to left field of environment and sustained attention to self-feeding for ~60 seconds.  Patient  was 100% intelligible and required Min verbal cues for use of pacing at the phrase level. SLP also facilitated session by providing Mod A verbal and question cues for orientation to date and to recall information after a 5 minute delay.  SLP provided set-up assist of suction toothbrush and patient completed oral care with supervision verbal cues for thoroughness.  Patient left semi-reclined in bed with alarm on and all needs within reach. Continue with current plan of care.    Function:  Eating Eating   Modified Consistency Diet: Yes Eating Assist Level: Supervision or verbal cues;Helper checks for pocketed food;Set up assist for   Eating Set Up Assist For: Opening containers       Cognition Comprehension Comprehension assist level: Understands basic 90% of the time/cues < 10% of the time  Expression   Expression assist level: Expresses basic 75 - 89% of the time/requires cueing 10 - 24% of the time. Needs helper to occlude trach/needs to repeat words.  Social Interaction Social Interaction assist level: Interacts appropriately 50 - 74% of the time - May be physically or verbally inappropriate.  Problem Solving Problem solving assist level: Solves basic 25 - 49% of the time - needs direction more than half the time to initiate, plan or complete simple activities  Memory Memory assist level: Recognizes or recalls 50 - 74% of the time/requires cueing 25 - 49% of the time    Pain Pain Assessment Pain Assessment: No/denies pain  Therapy/Group: Individual Therapy  Kanae Ignatowski 04/12/2015, 8:36 AM

## 2015-04-12 NOTE — Progress Notes (Signed)
Occupational Therapy Session Note  Patient Details  Name: Luman Basset MRN: [id number] Date of Birth: 06/09/1950  Today's Date: 04/12/2015 OT Individual Time: 1040-1210 OT Individual Time Calculation (min): 90 min    Short Term Goals: Week 1:  OT Short Term Goal 1 (Week 1): Pt will perform UB dressing with min A in order to increase I with self care.  OT Short Term Goal 2 (Week 1): Pt will perform toilet transfer with min A in order to increase I with functional transfer.  OT Short Term Goal 3 (Week 1): Pt will scan to the L and locate items needed for self care tasks with min verbal cues.  OT Short Term Goal 4 (Week 1): Pt will perform LB dressing with mod A in order to decrease assist with self care.   Skilled Therapeutic Interventions/Progress Updates: Patient participated in skilled OT to address toileting, self care and transfers.    Focus on trunk stability and right leaning awareness and righting self.   Patient was able to complete transfers with Min A stand pivot.  Most of the session was focus on patient initiation and task completion.    This clinician assisted patieht with practicing managing brief in order to use urinal.   Patient required min A and will benefit from more practice.     Therapy Documentation Precautions:  Precautions Precautions: Fall Precaution Comments: L hemiparesis, L inattention Restrictions Weight Bearing Restrictions: No  Pain: denied    See Function Navigator for Current Functional Status.   Therapy/Group: Individual Therapy  Bud Face [facility] 04/12/2015, 1:19 PM

## 2015-04-13 ENCOUNTER — Inpatient Hospital Stay (HOSPITAL_COMMUNITY): Payer: Medicare Other | Admitting: Occupational Therapy

## 2015-04-13 ENCOUNTER — Inpatient Hospital Stay (HOSPITAL_COMMUNITY): Payer: Medicare Other

## 2015-04-13 DIAGNOSIS — G729 Myopathy, unspecified: Secondary | ICD-10-CM

## 2015-04-13 DIAGNOSIS — R05 Cough: Secondary | ICD-10-CM

## 2015-04-13 DIAGNOSIS — R059 Cough, unspecified: Secondary | ICD-10-CM | POA: Diagnosis present

## 2015-04-13 DIAGNOSIS — M6289 Other specified disorders of muscle: Secondary | ICD-10-CM | POA: Insufficient documentation

## 2015-04-13 NOTE — Progress Notes (Signed)
[place] PHYSICAL MEDICINE & REHABILITATION     PROGRESS NOTE    Subjective/Complaints:  Patient seen sitting up his morning in his wheelchair with therapies. He notes that he has been coughing all night, but denies fevers chills. He also notes neck stiffness after sleeping in the sitting position.  ROS: + Cough, neck stiffness. Denies CP, SOB, n/v/d, fevers, chills.   Objective: Vital Signs: Blood pressure 122/68, pulse 65, temperature 98.6 F (37 C), temperature source Oral, resp. rate 20, height 5\' 5"  (1.651 m), weight 73.6 kg (162 lb 4.1 oz), SpO2 100 %. No results found.  Recent Labs  04/11/15 0436  WBC 4.8  HGB 11.6*  HCT 36.1*  PLT 169    Recent Labs  04/11/15 0436  NA 143  K 3.4*  CL 107  GLUCOSE 104*  BUN 8  CREATININE 0.80  CALCIUM 9.2   CBG (last 3)  No results for input(s): GLUCAP in the last 72 hours.  Wt Readings from Last 3 Encounters:  04/10/15 73.6 kg (162 lb 4.1 oz)  04/04/15 71.1 kg (156 lb 12 oz)  02/20/15 74.844 kg (165 lb)    Physical Exam:  BP 122/68 mmHg  Pulse 65  Temp(Src) 98.6 F (37 C) (Oral)  Resp 20  Ht 5\' 5"  (1.651 m)  Wt 73.6 kg (162 lb 4.1 oz)  BMI 27.00 kg/m2  SpO2 100% Constitutional: He appears well-developed and well-nourished. Vitals reviewed. HENT: Normocephalic and atraumatic.  Eyes: Conjunctivae and EOM are normal.  Neck: Normal range of motion. Neck supple.  Cardiovascular: Normal rate and regular rhythm.  Respiratory: Effort normal and breath sounds normal. No respiratory distress. He has no wheezes.  GI: Soft. Bowel sounds are normal. He exhibits no distension. There is no tenderness.  Musculoskeletal: He exhibits no edema. TTP over posterior neck.  Neurological: He is alert and oriented. Coordination abnormal.  Left facial weakness with mild dysarthria.  Able to follow commands.  RUE 4/5 delt, 4/5 Biceps due to pain 5/5 triceps and grip 0/5 LUE 4+/5 L KE, 3/5 L HF and 3- ADF Antigravity  strength in RLE Skin: Skin is warm and dry.   Assessment/Plan: 1. Functional deficits secondary to Right frontal hypertensive ICH which require 3+ hours per day of interdisciplinary therapy in a comprehensive inpatient rehab setting. Physiatrist is providing close team supervision and 24 hour management of active medical problems listed below. Physiatrist and rehab team continue to assess barriers to discharge/monitor patient progress toward functional and medical goals.  Function:  Bathing Bathing position   Position: Sitting EOB  Bathing parts Body parts bathed by patient: Chest, Abdomen, Front perineal area, Right upper leg, Left upper leg Body parts bathed by helper: Right arm, Left arm, Buttocks, Right lower leg, Left lower leg, Back  Bathing assist Assist Level:  (mod A)      Upper Body Dressing/Undressing Upper body dressing   What is the patient wearing?: Pull over shirt/dress     Pull over shirt/dress - Perfomed by patient: Thread/unthread right sleeve Pull over shirt/dress - Perfomed by helper: Thread/unthread left sleeve, Put head through opening, Pull shirt over trunk        Upper body assist Assist Level:  (max A)      Lower Body Dressing/Undressing Lower body dressing   What is the patient wearing?: Underwear, Pants, Non-skid slipper socks Underwear - Performed by patient: Thread/unthread right underwear leg Underwear - Performed by helper: Thread/unthread left underwear leg, Pull underwear up/down Pants- Performed by patient: Thread/unthread  right pants leg Pants- Performed by helper: Thread/unthread left pants leg, Pull pants up/down, Fasten/unfasten pants   Non-skid slipper socks- Performed by helper: Don/doff right sock, Don/doff left sock                  Lower body assist Assist for lower body dressing:  (max A)      Toileting Toileting   Toileting steps completed by patient: Performs perineal hygiene Toileting steps completed by helper:  Adjust clothing prior to toileting, Performs perineal hygiene, Adjust clothing after toileting Toileting Assistive Devices: Grab bar or rail  Toileting assist Assist level:  (total)   Transfers Chair/bed transfer   Chair/bed transfer method: Stand pivot, Ambulatory Chair/bed transfer assist level: Touching or steadying assistance (Pt > 75%) Chair/bed transfer assistive device: Armrests     Locomotion Ambulation     Max distance: 150 Assist level: 2 helpers (+2 for safety; max progressing to min/mod)   Wheelchair       Assist Level: Dependent (Pt equals 0%)  Cognition Comprehension Comprehension assist level: Understands basic 90% of the time/cues < 10% of the time  Expression Expression assist level: Expresses basic 75 - 89% of the time/requires cueing 10 - 24% of the time. Needs helper to occlude trach/needs to repeat words.  Social Interaction Social Interaction assist level: Interacts appropriately 50 - 74% of the time - May be physically or verbally inappropriate.  Problem Solving Problem solving assist level: Solves basic 25 - 49% of the time - needs direction more than half the time to initiate, plan or complete simple activities  Memory Memory assist level: Recognizes or recalls 50 - 74% of the time/requires cueing 25 - 49% of the time    Medical Problem List and Plan: 1. 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 DVT Prophylaxis/Anticoagulation: Pharmaceutical: Heparin, prophyllactic dose, local care for RUE DVT 3. Pain Management: Tylenol prn for pain.  4. Mood: LCSW to follow for evaluation and support.  5. Neuropsych: This patient is capable of making decisions on his own behalf. 6. Skin/Wound Care: Routine pressure relief measures 7. Fluids/Electrolytes/Nutrition: Monitor I/O. Offer supplements between meals as intake poor. Check lytes in am. 8. Malignant HTN: Monitor BP every 6 hours as DBP ranging 90-  120.   Continue cozaar  Coreg increased to 25 on 12/31  Well controlled this a.m. 9. CAD: Monitor for symptoms with increase in activity level. Continue coreg and cozaar. ASA/Plavix discontinued. Lipitor on hold due to bleed.  10. ABLA: Monitor for now.   Hb 11.6 on 12/30 11. Hypokalemia: Likely due to IVF.   Supplemented on 12/30 12.  Muscle stiffness  In neck, likely positional.  Will order K pad, informed patient muscle relaxant was available to him if necessary  Continue to monitor 13. Cough  Patient notes productive of white sputum    Lungs clear  Will order CXR   LOS (Days) 3 A FACE TO FACE EVALUATION WAS PERFORMED  Najir Roop Karis Juba 04/13/2015 9:31 AM

## 2015-04-13 NOTE — Progress Notes (Signed)
Occupational Therapy Session Note  Patient Details  Name: Randall Patel MRN: [id number] Date of Birth: 1950/09/08  Today's Date: 04/13/2015 OT Individual Time: 7543-6067 OT Individual Time Calculation (min): 60 min    Short Term Goals: Week 1:  OT Short Term Goal 1 (Week 1): Pt will perform UB dressing with min A in order to increase I with self care.  OT Short Term Goal 2 (Week 1): Pt will perform toilet transfer with min A in order to increase I with functional transfer.  OT Short Term Goal 3 (Week 1): Pt will scan to the L and locate items needed for self care tasks with min verbal cues.  OT Short Term Goal 4 (Week 1): Pt will perform LB dressing with mod A in order to decrease assist with self care.   Skilled Therapeutic Interventions/Progress Updates: Patient participated in skilled OT with focus on using left hand as gross assist and participated in handover hand assist as well as safetly with dynamic standing and transfers.    Patient required min A for transfer.   As well, today he was able to sit on toilet unsupport with no losses of balance to right as compared to yesterday with one episode of right leaning which this clinician helped him to right.    Pain:  Left neck.   Pt. Did not rate but described as new to this clinician and doctor who came in.   Doctor suggested a new medication as pain was new onset per patient report Therapy Documentation Precautions:  Precautions Precautions: Fall Precaution Comments: L hemiparesis, L inattention Restrictions Weight Bearing Restrictions: No  See Function Navigator for Current Functional Status.   Therapy/Group: Individual Therapy  Bud Face [facility] 04/13/2015, 10:49 AM

## 2015-04-14 ENCOUNTER — Inpatient Hospital Stay (HOSPITAL_COMMUNITY): Payer: Medicare Other | Admitting: Occupational Therapy

## 2015-04-14 ENCOUNTER — Inpatient Hospital Stay (HOSPITAL_COMMUNITY): Payer: Medicare Other | Admitting: Speech Pathology

## 2015-04-14 ENCOUNTER — Inpatient Hospital Stay (HOSPITAL_COMMUNITY): Payer: Medicare Other

## 2015-04-14 NOTE — Progress Notes (Signed)
Occupational Therapy Session Note  Patient Details  Name: Randall Patel MRN: [id number] Date of Birth: Sep 07, 1950  Today's Date: 04/14/2015 OT Individual Time: 0945-1100 OT Individual Time Calculation (min): 75 min    Short Term Goals: Week 1:  OT Short Term Goal 1 (Week 1): Pt will perform UB dressing with min A in order to increase I with self care.  OT Short Term Goal 2 (Week 1): Pt will perform toilet transfer with min A in order to increase I with functional transfer.  OT Short Term Goal 3 (Week 1): Pt will scan to the L and locate items needed for self care tasks with min verbal cues.  OT Short Term Goal 4 (Week 1): Pt will perform LB dressing with mod A in order to decrease assist with self care.   Skilled Therapeutic Interventions/Progress Updates:  Upon entering the room, pt supine in bed awaiting therapist with no c/o pain this session. Skilled OT session with focus on self care retraining from shower, functional transfers, weight shifting, safety awareness, education, and vision strategies. Pt performed supine >sit with mod A from flat bed. Pt transferring into wheelchair and OT assisting pt into bathroom. Max verbal cues for pt to lock wheelchair this session prior to transfers/standing. Pt performed stand pivot transfer wheelchair >TTB for bathing at shower level with min A. Pt performing lateral leans in order to wash buttocks this session. Pt needing mod A and mod verbal cues for weight shifting as pt showing L lateral lean while in shower. Pt dressed from wheelchair with min A for sit <>stand from sink. Pt verbalizing need to start with weak side first. Pt requiring mod verbal cues to look to the L to locate self care items on L side of sink this session. Once dressed, quick release belt and arm tray donned. OT educated pt on secondary stroke statistics with pt verbalizing understanding but education to continue. Call bell and all needed items within reach upon exiting the room.     Therapy Documentation Precautions:  Precautions Precautions: Fall Precaution Comments: L hemiparesis, L inattention Restrictions Weight Bearing Restrictions: No  See Function Navigator for Current Functional Status.   Therapy/Group: Individual Therapy  Lowella Grip 04/14/2015, 11:25 AM

## 2015-04-14 NOTE — Progress Notes (Signed)
Physical Therapy Session Note  Patient Details  Name: Randall Patel MRN: [id number] Date of Birth: [birth year]  Today's Date: 04/14/2015 PT Individual Time: 0800-0900 PT Individual Time Calculation (min): 60 min   Short Term Goals: Week 1:  PT Short Term Goal 1 (Week 1): Pt will be able to perform functional transfers with min assist level  PT Short Term Goal 2 (Week 1): Pt will be able to gait x 150' with min assist with LRAD PT Short Term Goal 3 (Week 1): Pt will be able to maintain midline positioning of trunk/head during functional activity x 2 min with mod verbal cues  Skilled Therapeutic Interventions/Progress Updates:    Session focused on addressing functional mobility and balance, neuro re-ed for balance retraining, postural control, coordination and forced use of L hemibody, endurance and activity tolerance, gait training without assistive device, and dual task cognitive activities to address sequencing, initiation, attention and awareness. Pt requires mod to max verbal cues for sequencing, initiation, and attention throughout. Alternating toe taps on block with visual target for foot placement with mirror in front of patient for visual feedback and functional reaching task to L to address balance, increased weightbearing on L, coordination and sequencing activity with up to mod-max verbal cues and min to max assist for balance. Dynamic gait activity to locate and pick up objects on L side at various heights to address L attention and awareness during functional mobility with min assist for gait and verbal cues to problem solve obstacle negotiation, attention to L, and overall safety awareness with mobility. Neuro re-ed on Kinetron in seated position to address postural control, orientation to midline, and functional strengthening and BLE (closed chain activity) with mirror in front for visual feedback for patient to maintain body in midline positioning. Would benefit from this in standing  position as well as possible ACE wrap to keep LUE on bar for increased proprioceptive input. Pt able to correct with verbal cues and visual feedback. End of session assisted to toilet with min assist for transfer but cues to position on toilet as pt keeps body turned to right. Handoff to NT.  Therapy Documentation Precautions:  Precautions Precautions: Fall Precaution Comments: L hemiparesis, L inattention Restrictions Weight Bearing Restrictions: No   Pain:  Denies pain currently. Reports his head bothered him last night but is better.   See Function Navigator for Current Functional Status.   Therapy/Group: Individual Therapy  Karolee Stamps Darrol Poke, PT, DPT  04/14/2015, 9:40 AM

## 2015-04-14 NOTE — Progress Notes (Signed)
Speech Language Pathology Daily Session Note  Patient Details  Name: Randall Patel MRN: [id number] Date of Birth: 06/15/1950  Today's Date: 04/14/2015 SLP Individual Time: 1500-1600 SLP Individual Time Calculation (min): 60 min  Short Term Goals: Week 1: SLP Short Term Goal 1 (Week 1): Pt will consume dys 1 textures and nectar thick liquids with mod assist verbal cues to monitor and correct anterior loss of boluses over 2 targeted sessions.   SLP Short Term Goal 2 (Week 1): Pt will consume trials of dys 2 textures with mod assist verbal cues to monitor and correct left sided buccal residue over 3 targeted sessions prior to advancement.   SLP Short Term Goal 3 (Week 1): Pt will consume therapeutic trials of regular, unthickened water over 2 targeted sessions with min assist verbal cues for use of safe swallowing precautions and minimal overt s/s of aspiration prior to repeat MBS  SLP Short Term Goal 4 (Week 1): Pt will attend to the left environment during basic, familiar tasks with max assist verbal cues.   SLP Short Term Goal 5 (Week 1): Pt will sustain his attention to a basic, familiar task for 3-5 minute intervals with mod assist verbal cues for redirection.    Skilled Therapeutic Interventions: Skilled treatment session focused on addressing dysphagia and cognition goals. SLP facilitated session by providing set-up of thin liquids and Max faded to Mod assist verbal cues to consume a small sip followed by a throat clear and then a second swallow. Patient with 2 instances of strong reflexive cough while consuming 6oz of water via cup.  SLP also facilitated a basic money management task with Max assist verbal and visual cues to organize task by sorting coins prior to attempting to count.  Patient also benefited Mod verbal cues for sustained attention to task for 2-3 minute intervals.  Continue with current plan of care.    Function:  Eating Eating   Modified Consistency Diet: No (trials o  thin only with SLP) Eating Assist Level: Supervision or verbal cues           Cognition Comprehension Comprehension assist level: Understands basic 75 - 89% of the time/ requires cueing 10 - 24% of the time  Expression   Expression assist level: Expresses basic 90% of the time/requires cueing < 10% of the time.  Social Interaction Social Interaction assist level: Interacts appropriately 25 - 49% of time - Needs frequent redirection.  Problem Solving Problem solving assist level: Solves basic 25 - 49% of the time - needs direction more than half the time to initiate, plan or complete simple activities  Memory Memory assist level: Recognizes or recalls 50 - 74% of the time/requires cueing 25 - 49% of the time    Pain Pain Assessment Pain Assessment: No/denies pain  Therapy/Group: Individual Therapy  Charlane Ferretti., CCC-SLP [phone]  Essa Malachi 04/14/2015, 5:47 PM

## 2015-04-14 NOTE — Progress Notes (Signed)
[place] PHYSICAL MEDICINE & REHABILITATION     PROGRESS NOTE    Subjective/Complaints:  Pt seen this morning sitting up in his wheelchair about to begin therapies.  He states his cough and neck stiffness have improved.  He also mentions mild headache overnight that has since resolved.  ROS: Denies CP, SOB, n/v/d, fevers, chills.   Objective: Vital Signs: Blood pressure 128/63, pulse 113, temperature 98.7 F (37.1 C), temperature source Oral, resp. rate 19, height 5\' 5"  (1.651 m), weight 73.6 kg (162 lb 4.1 oz), SpO2 96 %. Dg Chest 2 View  04/13/2015  CLINICAL DATA:  Pt reports cough since hospital admission on 12/24; Pt reports h/o HTN, stroke x 2 (most recent on 12/24), and MI x 2; smoker EXAM: CHEST  2 VIEW COMPARISON:  04/07/2015 FINDINGS: Right central line has been removed. Heart size is normal. Lungs are clear. Coronary stent is visible. IMPRESSION: No evidence for acute  abnormality. Electronically Signed   By: Norva Pavlov M.D.   On: 04/13/2015 11:47   No results for input(s): WBC, HGB, HCT, PLT in the last 72 hours. No results for input(s): NA, K, CL, GLUCOSE, BUN, CREATININE, CALCIUM in the last 72 hours.  Invalid input(s): CO CBG (last 3)  No results for input(s): GLUCAP in the last 72 hours.  Wt Readings from Last 3 Encounters:  04/10/15 73.6 kg (162 lb 4.1 oz)  04/04/15 71.1 kg (156 lb 12 oz)  02/20/15 74.844 kg (165 lb)    Physical Exam:  BP 128/63 mmHg  Pulse 113  Temp(Src) 98.7 F (37.1 C) (Oral)  Resp 19  Ht 5\' 5"  (1.651 m)  Wt 73.6 kg (162 lb 4.1 oz)  BMI 27.00 kg/m2  SpO2 96% Constitutional: He appears well-developed and well-nourished. Vitals reviewed. HENT: Normocephalic and atraumatic.  Eyes: Conjunctivae and EOM are normal.  Neck: Normal range of motion. Neck supple.  Cardiovascular: Normal rate and regular rhythm.  Respiratory: Effort normal and breath sounds normal. No respiratory distress. He has no wheezes.  GI: Soft. Bowel sounds  are normal. He exhibits no distension. There is no tenderness.  Musculoskeletal: He exhibits no edema. TTP over posterior neck.  Neurological: He is alert and oriented. Coordination abnormal.  Left facial weakness with mild dysarthria.  Able to follow commands.  RUE 4/5 delt, 4/5 Biceps due to pain 5/5 triceps and grip 0/5 LUE 4+/5 L KE, 3/5 L HF and 4- ADF 5/5 in RLE Sensation intact BUE and BLE  Skin: Skin is warm and dry.   Assessment/Plan: 1. Functional deficits secondary to Right frontal hypertensive ICH which require 3+ hours per day of interdisciplinary therapy in a comprehensive inpatient rehab setting. Physiatrist is providing close team supervision and 24 hour management of active medical problems listed below. Physiatrist and rehab team continue to assess barriers to discharge/monitor patient progress toward functional and medical goals.  Function:  Bathing Bathing position   Position: Wheelchair/chair at sink  Bathing parts Body parts bathed by patient: Left arm, Chest, Front perineal area, Abdomen, Right upper leg, Left upper leg Body parts bathed by helper: Right arm, Buttocks, Back  Bathing assist Assist Level:  (mod A)      Upper Body Dressing/Undressing Upper body dressing   What is the patient wearing?: Pull over shirt/dress     Pull over shirt/dress - Perfomed by patient: Thread/unthread right sleeve Pull over shirt/dress - Perfomed by helper: Thread/unthread left sleeve, Put head through opening, Pull shirt over trunk  Upper body assist Assist Level:  (max A)      Lower Body Dressing/Undressing Lower body dressing   What is the patient wearing?: Non-skid slipper socks, Underwear, Pants Underwear - Performed by patient: Thread/unthread right underwear leg Underwear - Performed by helper: Pull underwear up/down, Thread/unthread left underwear leg Pants- Performed by patient: Pull pants up/down, Thread/unthread right pants leg Pants- Performed  by helper: Thread/unthread left pants leg Non-skid slipper socks- Performed by patient: Don/doff left sock, Don/doff right sock Non-skid slipper socks- Performed by helper: Don/doff right sock, Don/doff left sock                  Lower body assist Assist for lower body dressing:  (max A)      Toileting Toileting   Toileting steps completed by patient: Adjust clothing prior to toileting, Performs perineal hygiene, Adjust clothing after toileting Toileting steps completed by helper: Adjust clothing prior to toileting, Performs perineal hygiene, Adjust clothing after toileting Toileting Assistive Devices: Grab bar or rail  Toileting assist Assist level: Touching or steadying assistance (Pt.75%)   Transfers Chair/bed transfer   Chair/bed transfer method: Stand pivot, Ambulatory Chair/bed transfer assist level: Touching or steadying assistance (Pt > 75%) Chair/bed transfer assistive device: Armrests     Locomotion Ambulation     Max distance: 150 Assist level: 2 helpers (+2 for safety; max progressing to min/mod)   Wheelchair       Assist Level: Dependent (Pt equals 0%)  Cognition Comprehension Comprehension assist level: Understands basic 90% of the time/cues < 10% of the time  Expression Expression assist level: Expresses basic 75 - 89% of the time/requires cueing 10 - 24% of the time. Needs helper to occlude trach/needs to repeat words.  Social Interaction Social Interaction assist level: Interacts appropriately 50 - 74% of the time - May be physically or verbally inappropriate.  Problem Solving Problem solving assist level: Solves basic 25 - 49% of the time - needs direction more than half the time to initiate, plan or complete simple activities  Memory Memory assist level: Recognizes or recalls 50 - 74% of the time/requires cueing 25 - 49% of the time    Medical Problem List and Plan: 1. Left hemiparesis, gait disorder and dysphargia secondary to Right frontal  hypertensive ICH on 12/23 2. DVT Prophylaxis/Anticoagulation: Pharmaceutical: Heparin, prophyllactic dose, local care for RUE DVT 3. Pain Management: Tylenol prn for pain.  4. Mood: LCSW to follow for evaluation and support.  5. Neuropsych: This patient is capable of making decisions on his own behalf. 6. Skin/Wound Care: Routine pressure relief measures 7. Fluids/Electrolytes/Nutrition: Monitor I/O. Offer supplements between meals as intake poor. Check lytes in am. 8. Malignant HTN: Monitor BP every 6 hours as DBP ranging 90- 120.   Continue cozaar  Coreg increased to 25 on 12/31  Controlled this a.m. 9. CAD: Monitor for symptoms with increase in activity level. Continue coreg and cozaar. ASA/Plavix discontinued. Lipitor on hold due to bleed.  10. ABLA: Monitor for now.   Hb 11.6 on 12/30 11. Hypokalemia: Likely due to IVF.   Supplemented on 12/30 12.  Muscle stiffness: Resolved  K pad ordered 13. Cough: Improved  Patient notes productive of white sputum    CXR reviewed from 1/1, no acute abnormalities    LOS (Days) 4 A FACE TO FACE EVALUATION WAS PERFORMED  Ankit Karis Juba 04/14/2015 9:08 AM

## 2015-04-15 ENCOUNTER — Inpatient Hospital Stay (HOSPITAL_COMMUNITY): Payer: Medicare Other | Admitting: Speech Pathology

## 2015-04-15 ENCOUNTER — Inpatient Hospital Stay (HOSPITAL_COMMUNITY): Payer: Medicare Other | Admitting: Occupational Therapy

## 2015-04-15 ENCOUNTER — Inpatient Hospital Stay (HOSPITAL_COMMUNITY): Payer: Medicare Other | Admitting: *Deleted

## 2015-04-15 ENCOUNTER — Inpatient Hospital Stay (HOSPITAL_COMMUNITY): Payer: Medicare Other

## 2015-04-15 LAB — BASIC METABOLIC PANEL
Anion gap: 8 (ref 5–15)
BUN: 8 mg/dL (ref 6–20)
CALCIUM: 9.4 mg/dL (ref 8.9–10.3)
CO2: 24 mmol/L (ref 22–32)
CREATININE: 0.97 mg/dL (ref 0.61–1.24)
Chloride: 107 mmol/L (ref 101–111)
GLUCOSE: 93 mg/dL (ref 65–99)
Potassium: 4.1 mmol/L (ref 3.5–5.1)
SODIUM: 139 mmol/L (ref 135–145)

## 2015-04-15 NOTE — Progress Notes (Signed)
Occupational Therapy Session Note  Patient Details  Name: Randall Patel MRN: [id number] Date of Birth: [birth year]  Today's Date: 04/15/2015 OT Individual Time: 0703-0800 and 1115-1200 OT Individual Time Calculation (min): 57 min and 45 min    Short Term Goals: Week 1:  OT Short Term Goal 1 (Week 1): Pt will perform UB dressing with min A in order to increase I with self care.  OT Short Term Goal 2 (Week 1): Pt will perform toilet transfer with min A in order to increase I with functional transfer.  OT Short Term Goal 3 (Week 1): Pt will scan to the L and locate items needed for self care tasks with min verbal cues.  OT Short Term Goal 4 (Week 1): Pt will perform LB dressing with mod A in order to decrease assist with self care.   Skilled Therapeutic Interventions/Progress Updates:  Session 1: Upon entering the room, pt supine in bed and talking into phone that was not turned on at that time. Pt with no c/o pain this session and agreeable to self care tasks at sink side. Pt requiring max verbal cues this session to attend to task, sequencing, and initiation of tasks. While seated in wheelchair at sink, pt needing max tactile, verbal, and visual feedback from mirror in regard to posture and L lateral lean. Pt needing assist with L UE for safety awareness with transfer. OT providing hand over hand assist for L UE to assist with self care tasks. Pt needing mod verbal cues to look to L in order to find/obtain self care items needed. Pt seated in wheelchair with quick release belt and lay tray for safety. Call bell and all needed items within reach.   Session 2: Upon entering the room, pt seated in wheelchair awaiting therapist with no c/o pain this session. Pt ambulating without use of AD and min A to assist pt from ambulating into obstacles on the L side. Pt ambulating 100' to laundry room. He stood with L UE in weight bearing on top of dryer while reaching into dryer with R hand to obtain clothing  items and place into bag. Pt requiring assistance from therapist to keep L hand in weight bearing position. Pt losing balance to the L when attempting to turn around in laundry room with mod A to correct. Pt seated in day room for rest break secondary to fatigue. Pt requiring max A for mutlimodal cues for safety, initiation, L UE safety awareness, and sequencing. Pt returned to bed room in same manner as stated above. Pt returning to supine in bed with bed alarm activated and call bell within reach.   Therapy Documentation Precautions:  Precautions Precautions: Fall Precaution Comments: L hemiparesis, L inattention Restrictions Weight Bearing Restrictions: No  See Function Navigator for Current Functional Status.   Therapy/Group: Individual Therapy  Lowella Grip 04/15/2015, 12:24 PM

## 2015-04-15 NOTE — Progress Notes (Signed)
Physical Therapy Session Note  Patient Details  Name: Randall Patel MRN: [id number] Date of Birth: October 06, 1950  Today's Date: 04/15/2015 PT Individual Time: 0900-1000 PT Individual Time Calculation (min): 60 min   Short Term Goals: Week 1:  PT Short Term Goal 1 (Week 1): Pt will be able to perform functional transfers with min assist level  PT Short Term Goal 2 (Week 1): Pt will be able to gait x 150' with min assist with LRAD PT Short Term Goal 3 (Week 1): Pt will be able to maintain midline positioning of trunk/head during functional activity x 2 min with mod verbal cues  Skilled Therapeutic Interventions/Progress Updates:   Session focused on addressing functional mobility during functional tasks of gathering laundry from drawer, loading into washer, and starting laundry with mod verbal cues for sequencing, attention to L, and safety, neuro re-ed for balance retraining, postural control, coordination and forced use of L hemibody on Kinetron in standing with mirror in front for visual feedback, endurance and activity tolerance, and gait training without assistive device with min assist.  End of session left up in w/c with safety belt donned and notified NT that pt request to use bathroom.   Therapy Documentation Precautions:  Precautions Precautions: Fall Precaution Comments: L hemiparesis, L inattention Restrictions Weight Bearing Restrictions: No  Pain:  Denies pain.   See Function Navigator for Current Functional Status.   Therapy/Group: Individual Therapy  Karolee Stamps Darrol Poke, PT, DPT  04/15/2015, 12:07 PM

## 2015-04-15 NOTE — Progress Notes (Signed)
Recreational Therapy Assessment and Plan  Patient Details  Name: Randall Patel MRN: [id number] Date of Birth: 11-19-1950 Today's Date: 04/15/2015  Rehab Potential: Good ELOS: 10 days   Assessment Clinical Impression:  Problem List:  Patient Active Problem List   Diagnosis Date Noted  . Hemiparesis affecting left side as late effect of stroke ([place])   . Dysphagia as late effect of cerebrovascular disease   . Essential hypertension   . Acute blood loss anemia   . 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 Dysphagia   . Dysarthria due to cerebrovascular accident   . Diastolic dysfunction   . Tobacco abuse   . ETOH abuse   . History of CVA with residual deficit   . Coronary artery disease involving native coronary artery of native heart with angina pectoris ([place])   . Tachypnea   . Hypernatremia   . Hypokalemia   . Thrombocytopenia ([place])   . Intracranial hemorrhage ([place])   . Essential hypertension, malignant   . ICH (intracerebral hemorrhage) ([place]) 04/04/2015  . STEMI (ST elevation myocardial infarction) ([place]) 04/14/2012  . NSTEMI (non-ST elevated myocardial infarction) ([place]) 04/14/2012  . Cardiomyopathy, ischemic 04/14/2012  . Cerebral embolism with cerebral infarction ([place]) 04/03/2012  . Unspecified transient cerebral ischemia 04/02/2012  . Hemiplegia, unspecified, affecting nondominant side 04/02/2012  . CAD (coronary artery disease) s/p stent 2008 in [place] 04/02/2012  . Hypertension     Past Medical History:  Past Medical History  Diagnosis Date  . Hypertension   . Coronary artery disease     MI in 2008 with PCI to ? vessel; NSTEMI 03/2012; NSTEMI 04/2012 Cath showed triple vessel dz including occluded RCA, s/p DES to distal RCA, PTCA posterolateral branch   . CVA (cerebral infarction)     03/2012 with left arm weakness/numbness and left  facial droop.  . Ischemic cardiomyopathy     04/2012 EF 35-40%  . Hyperlipidemia   . Claudication ([place])   . Tobacco abuse     quit 04/06/12  . ETOH abuse     quit 04/06/12  . Stroke [facility])    Past Surgical History:  Past Surgical History  Procedure Laterality Date  . Hernia repair    . Left heart catheterization with coronary angiogram N/A 04/11/2012    Procedure: LEFT HEART CATHETERIZATION WITH CORONARY ANGIOGRAM; Surgeon: Burnell Blanks, MD; Location: [facility] CATH LAB; Service: Cardiovascular; Laterality: N/A;  . Percutaneous coronary stent intervention (pci-s)  04/11/2012    Procedure: PERCUTANEOUS CORONARY STENT INTERVENTION (PCI-S); Surgeon: Burnell Blanks, MD; Location: [facility] CATH LAB; Service: Cardiovascular;; Distal RCA  . Percutaneous coronary intervention-balloon only  04/11/2012    Procedure: PERCUTANEOUS CORONARY INTERVENTION-BALLOON ONLY; Surgeon: Burnell Blanks, MD; Location: [facility] CATH LAB; Service: Cardiovascular;; RPLA    Assessment & Plan Clinical Impression: Patient is a 65 y.o. year old male with history of HTN, CAD with ICM, polysubstance abuse, 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 UDS positive for THC. CT head done revealing large right frontoparietal ICH and he was started on cardene drip and IV mannitol. Chronic ASA/plavix discontinued and Dr. [name] recommended conservative management with serial CCT. 2D echo done revealing EF 40-45% with moderate LVH and mild MVR. Repeat CT head with increase in midline shift and mass effect and mannitol changed to hypertonic saline with goal 150-155.  Patient has had confusion with fluctuating bouts  of lethargy. Carotid dopplers without significant ICA stenosis. MBS done 12/27 and patient was placed on dysphagia 1, nectar liquids. Dr. Erlinda Hong feels  that patient had R-ICH with cerebral edema in setting of HTN and to consider outpatient 30 day cardiac event monitor to rule out embolic source. Hypertonic saline d/c yesterday and sodium normalizing. Blood pressures remain labile and po intake is poor. Patient with resultant LUE > LLE weakness, balance deficits, left inattention, poor safety awareness and dysphagia.Right upper ext pain and swelling evaluated today. Evidence of R cephalic but no axillary DVT. No anticoagulation recommended CIR recommended by MD and rehab team for follow up therapy. Patient transferred to CIR on 04/10/2015 .      Pt presents with decreased activity tolerance, decreased functional mobility, decreased balance, decreased coordination, decreased midline orientation, left inattention, decreased attention, decreased problem solving, decreased initiation, decreased awareness, decreased safety Limiting pt's independence with leisure/community pursuits.   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;Community - Doctor, hospital - Grocery store;Sports - [place] care Other Leisure Interests: Television;Cooking/Baking;Housework Leisure Participation Style: With Family/Friends Awareness of Community Resources: Excellent Psychosocial / Spiritual Patient agreeable to Pet Therapy: Yes Does patient have pets?: Yes (sister has a dog, he lives with sister) Social interaction - Mood/Behavior: Cooperative Academic librarian Appropriate for Education?: Yes Recreational Therapy Orientation Orientation -Reviewed with patient: Available activity resources Strengths/Weaknesses Patient Strengths/Abilities: Willingness to participate;Active premorbidly  Plan Rec Therapy Plan Is patient appropriate for Therapeutic Recreation?: Yes Rehab Potential: Good Treatment times per week: Min 1 time per week >20 minutes Estimated Length of Stay: 10  days TR Treatment/Interventions: Adaptive equipment instruction;1:1 session;Balance/vestibular training;Functional mobility training;Community reintegration;Cognitive remediation/compensation;Leisure education;Patient/family education;Therapeutic activities;Recreation/leisure participation;Therapeutic exercise;UE/LE Coordination activities;Visual/perceptual remediation/compensation;Wheelchair propulsion/positioning  Recommendations for other services: None  Discharge Criteria: Patient will be discharged from TR if patient refuses treatment 3 consecutive times without medical reason.  If treatment goals not met, if there is a change in medical status, if patient makes no progress towards goals or if patient is discharged from hospital.  The above assessment, treatment plan, treatment alternatives and goals were discussed and mutually agreed upon: by patient  [place] 04/15/2015, 10:39 AM

## 2015-04-15 NOTE — Progress Notes (Signed)
[facility] PHYSICAL MEDICINE & REHABILITATION     PROGRESS NOTE    Subjective/Complaints:  realloy no new complaints. Wants left side to move more!  Denies pain. Slept well.  ROS: Denies CP, SOB, n/v/d, fevers, chills. No anxiety, visual issues  Objective: Vital Signs: Blood pressure 159/84, pulse 60, temperature 98.4 F (36.9 C), temperature source Oral, resp. rate 18, height  (1.651 m), weight 73.6 kg (162 lb 4.1 oz), SpO2 100 %. Dg Chest 2 View  04/13/2015  CLINICAL DATA:  Pt reports cough since hospital admission on 12/24; Pt reports h/o HTN, stroke x 2 (most recent on 12/24), and MI x 2; smoker EXAM: CHEST  2 VIEW COMPARISON:  04/07/2015 FINDINGS: Right central line has been removed. Heart size is normal. Lungs are clear. Coronary stent is visible. IMPRESSION: No evidence for acute  abnormality. Electronically Signed   By: Norva Pavlov M.D.   On: 04/13/2015 11:47   No results for input(s): WBC, HGB, HCT, PLT in the last 72 hours.  Recent Labs  04/15/15 0427  NA 139  K 4.1  CL 107  GLUCOSE 93  BUN 8  CREATININE 0.97  CALCIUM 9.4   CBG (last 3)  No results for input(s): GLUCAP in the last 72 hours.  Wt Readings from Last 3 Encounters:  04/10/15 73.6 kg (162 lb 4.1 oz)  04/04/15 71.1 kg (156 lb 12 oz)  02/20/15 74.844 kg (165 lb)    Physical Exam:  BP 159/84 mmHg  Pulse 60  Temp(Src) 98.4 F (36.9 C) (Oral)  Resp 18  Ht  (1.651 m)  Wt 73.6 kg (162 lb 4.1 oz)  BMI 27.00 kg/m2  SpO2 100% Constitutional: He appears well-developed and well-nourished. Vitals reviewed. HENT: Normocephalic and atraumatic.  Eyes: Conjunctivae and EOM are normal.  Neck: Normal range of motion. Neck supple.  Cardiovascular: Normal rate and regular rhythm.  Respiratory: Effort normal and breath sounds normal. No respiratory distress. He has no wheezes.  GI: Soft. Bowel sounds are normal. He exhibits no distension. There is no tenderness.  Musculoskeletal: He exhibits  no edema. TTP over posterior neck.  Neurological: He is alert and oriented. Coordination abnormal.  Left facial weakness with mild dysarthria.  Able to follow commands.  RUE 4/5 delt, 4/5 Biceps due to pain 5/5 triceps and grip 0/5 LUE 3-4/5 L KE, 3- to 3/5 L HF and 4- ADF 5/5 in RLE Sensation intact BUE and BLE  Skin: Skin is warm and dry.   Assessment/Plan: 1. Functional deficits secondary to Right frontal hypertensive ICH which require 3+ hours per day of interdisciplinary therapy in a comprehensive inpatient rehab setting. Physiatrist is providing close team supervision and 24 hour management of active medical problems listed below. Physiatrist and rehab team continue to assess barriers to discharge/monitor patient progress toward functional and medical goals.  Function:  Bathing Bathing position   Position: Shower  Bathing parts Body parts bathed by patient: Left arm, Chest, Front perineal area, Abdomen, Right upper leg, Left upper leg, Buttocks Body parts bathed by helper: Right lower leg, Right arm, Left lower leg, Back  Bathing assist Assist Level:  (mod A)      Upper Body Dressing/Undressing Upper body dressing   What is the patient wearing?: Pull over shirt/dress     Pull over shirt/dress - Perfomed by patient: Thread/unthread right sleeve, Put head through opening Pull over shirt/dress - Perfomed by helper: Thread/unthread left sleeve, Pull shirt over trunk  Upper body assist Assist Level:  (mod A)      Lower Body Dressing/Undressing Lower body dressing   What is the patient wearing?: Non-skid slipper socks, Pants Underwear - Performed by patient: Thread/unthread right underwear leg Underwear - Performed by helper: Pull underwear up/down, Thread/unthread left underwear leg Pants- Performed by patient: Pull pants up/down, Thread/unthread right pants leg Pants- Performed by helper: Thread/unthread left pants leg Non-skid slipper socks- Performed by  patient: Don/doff left sock, Don/doff right sock Non-skid slipper socks- Performed by helper: Don/doff right sock, Don/doff left sock                  Lower body assist Assist for lower body dressing:  (mod A)      Toileting Toileting   Toileting steps completed by patient: Adjust clothing prior to toileting, Performs perineal hygiene, Adjust clothing after toileting Toileting steps completed by helper: Adjust clothing prior to toileting, Performs perineal hygiene, Adjust clothing after toileting Toileting Assistive Devices: Grab bar or rail  Toileting assist Assist level: Touching or steadying assistance (Pt.75%)   Transfers Chair/bed transfer   Chair/bed transfer method: Ambulatory, Stand pivot Chair/bed transfer assist level: Touching or steadying assistance (Pt > 75%) Chair/bed transfer assistive device: Armrests     Locomotion Ambulation     Max distance: 150 Assist level: Touching or steadying assistance (Pt > 75%)   Wheelchair       Assist Level: Dependent (Pt equals 0%)  Cognition Comprehension Comprehension assist level: Understands basic 75 - 89% of the time/ requires cueing 10 - 24% of the time  Expression Expression assist level: Expresses basic 90% of the time/requires cueing < 10% of the time.  Social Interaction Social Interaction assist level: Interacts appropriately 25 - 49% of time - Needs frequent redirection.  Problem Solving Problem solving assist level: Solves basic 25 - 49% of the time - needs direction more than half the time to initiate, plan or complete simple activities  Memory Memory assist level: Recognizes or recalls 50 - 74% of the time/requires cueing 25 - 49% of the time    Medical Problem List and Plan: 1. 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 DVT Prophylaxis/Anticoagulation: Pharmaceutical: Heparin, prophyllactic dose, local care for RUE DVT 3. Pain Management: Tylenol  prn for pain.  4. Mood: LCSW to follow for evaluation and support.  5. Neuropsych: This patient is capable of making decisions on his own behalf. 6. Skin/Wound Care: Routine pressure relief measures 7. Fluids/Electrolytes/Nutrition: Monitor I/O. Offer supplements between meals.   -bmet reviewed in person and normal today 8. Malignant HTN: Monitor BP every 6 hours as DBP ranging 90- 120.   Continue cozaar  Coreg increased to 25 on 12/31  -bp's improving 9. CAD: Monitor for symptoms with increase in activity level. Continue coreg and cozaar. ASA/Plavix discontinued. Lipitor on hold due to bleed.  10. ABLA: Monitor for now.   Hb 11.6 on 12/30 11. Hypokalemia: Likely due to IVF.   Supplemented on 12/30 12.  Muscle stiffness: Resolved  K pad ordered 13. Cough: Improved   CXR on 1/1, no acute abnormalities    LOS (Days) 5 A FACE TO FACE EVALUATION WAS PERFORMED  SWARTZ,ZACHARY T 04/15/2015 9:12 AM

## 2015-04-15 NOTE — Progress Notes (Signed)
Speech Language Pathology Daily Session Note  Patient Details  Name: Randall Patel MRN: [id number] Date of Birth: Apr 12, 1951  Today's Date: 04/15/2015 SLP Individual Time: 1500-1530 SLP Individual Time Calculation (min): 30 min  Short Term Goals: Week 1: SLP Short Term Goal 1 (Week 1): Pt will consume dys 1 textures and nectar thick liquids with mod assist verbal cues to monitor and correct anterior loss of boluses over 2 targeted sessions.   SLP Short Term Goal 2 (Week 1): Pt will consume trials of dys 2 textures with mod assist verbal cues to monitor and correct left sided buccal residue over 3 targeted sessions prior to advancement.   SLP Short Term Goal 3 (Week 1): Pt will consume therapeutic trials of regular, unthickened water over 2 targeted sessions with min assist verbal cues for use of safe swallowing precautions and minimal overt s/s of aspiration prior to repeat MBS  SLP Short Term Goal 4 (Week 1): Pt will attend to the left environment during basic, familiar tasks with max assist verbal cues.   SLP Short Term Goal 5 (Week 1): Pt will sustain his attention to a basic, familiar task for 3-5 minute intervals with mod assist verbal cues for redirection.    Skilled Therapeutic Interventions: Skilled treatment session focused on dysphagia goals. Patient performed oral care via suction toothbrush after set-up.  SLP facilitated session by providing Min A verbal cues for sequencing of multiple swallows and an intermittent throat clear with trials of thin liquids via cup. Patient consumed trials without overt s/s of aspiration, however, recommend patient continue current diet. Patient also required Min A verbal cues to self-monitor and correct left anterior spillage during a functional conversation. Patient was 100% intelligible and recalled events from previous therapy sessions with Min A question cues. Patient left upright in wheelchair with all needs within reach and quick release belt in  place. Continue with current plan of care.     Function:  Eating Eating   Modified Consistency Diet: No (trials of thin liquids with SLP only ) Eating Assist Level: Supervision or verbal cues   Eating Set Up Assist For: Opening containers       Cognition Comprehension Comprehension assist level: Understands basic 75 - 89% of the time/ requires cueing 10 - 24% of the time  Expression   Expression assist level: Expresses basic 90% of the time/requires cueing < 10% of the time.  Social Interaction Social Interaction assist level: Interacts appropriately 25 - 49% of time - Needs frequent redirection.  Problem Solving Problem solving assist level: Solves basic 25 - 49% of the time - needs direction more than half the time to initiate, plan or complete simple activities  Memory Memory assist level: Recognizes or recalls 50 - 74% of the time/requires cueing 25 - 49% of the time    Pain Pain Assessment Pain Assessment: No/denies pain  Therapy/Group: Individual Therapy  Ora Mcnatt 04/15/2015, 4:03 PM

## 2015-04-16 ENCOUNTER — Inpatient Hospital Stay (HOSPITAL_COMMUNITY): Payer: Medicare Other | Admitting: Occupational Therapy

## 2015-04-16 ENCOUNTER — Inpatient Hospital Stay (HOSPITAL_COMMUNITY): Payer: Medicare Other | Admitting: Physical Therapy

## 2015-04-16 ENCOUNTER — Inpatient Hospital Stay (HOSPITAL_COMMUNITY): Payer: Medicare Other | Admitting: Speech Pathology

## 2015-04-16 ENCOUNTER — Inpatient Hospital Stay (HOSPITAL_COMMUNITY): Payer: Medicare Other

## 2015-04-16 ENCOUNTER — Encounter (HOSPITAL_COMMUNITY): Payer: Medicare Other

## 2015-04-16 NOTE — Progress Notes (Signed)
[facility] PHYSICAL MEDICINE & REHABILITATION     PROGRESS NOTE    Subjective/Complaints:  Good night sleep. Asleep when i arrived in the room. Denies pain. No new complaints  ROS: Denies CP, SOB, n/v/d, fevers, chills. No anxiety, visual issues  Objective: Vital Signs: Blood pressure 138/90, pulse 66, temperature 99.1 F (37.3 C), temperature source Oral, resp. rate 18, height  (1.651 m), weight 75.3 kg (166 lb 0.1 oz), SpO2 98 %. No results found. No results for input(s): WBC, HGB, HCT, PLT in the last 72 hours.  Recent Labs  04/15/15 0427  NA 139  K 4.1  CL 107  GLUCOSE 93  BUN 8  CREATININE 0.97  CALCIUM 9.4   CBG (last 3)  No results for input(s): GLUCAP in the last 72 hours.  Wt Readings from Last 3 Encounters:  04/16/15 75.3 kg (166 lb 0.1 oz)  04/04/15 71.1 kg (156 lb 12 oz)  02/20/15 74.844 kg (165 lb)    Physical Exam:  BP 138/90 mmHg  Pulse 66  Temp(Src) 99.1 F (37.3 C) (Oral)  Resp 18  Ht  (1.651 m)  Wt 75.3 kg (166 lb 0.1 oz)  BMI 27.62 kg/m2  SpO2 98% Constitutional: He appears well-developed and well-nourished. Vitals reviewed. HENT: Normocephalic and atraumatic.  Eyes: Conjunctivae and EOM are normal.  Neck: Normal range of motion. Neck supple.  Cardiovascular: Normal rate and regular rhythm.  Respiratory: Effort normal and breath sounds normal. No respiratory distress. He has no wheezes.  GI: Soft. Bowel sounds are normal. He exhibits no distension. There is no tenderness.  Musculoskeletal: He exhibits no edema. TTP over posterior neck.  Neurological: He is alert and oriented. Coordination abnormal.  Left facial weakness with mild dysarthria.  Able to follow commands.  RUE 4/5 delt, 4/5 Biceps due to pain 5/5 triceps and grip 0/5 LUE 3-4/5 L KE, 3- to 3/5 L HF and 4- ADF 5/5 in RLE Sensation intact BUE and BLE  Skin: Skin is warm and dry.  Psych: affect very flat  Assessment/Plan: 1. Functional deficits secondary  to Right frontal hypertensive ICH which require 3+ hours per day of interdisciplinary therapy in a comprehensive inpatient rehab setting. Physiatrist is providing close team supervision and 24 hour management of active medical problems listed below. Physiatrist and rehab team continue to assess barriers to discharge/monitor patient progress toward functional and medical goals.  Function:  Bathing Bathing position   Position: Shower  Bathing parts Body parts bathed by patient: Left arm, Chest, Front perineal area, Abdomen, Right upper leg, Left upper leg, Buttocks Body parts bathed by helper: Left lower leg, Right lower leg, Right arm  Bathing assist Assist Level:  (mod A)      Upper Body Dressing/Undressing Upper body dressing   What is the patient wearing?: Pull over shirt/dress     Pull over shirt/dress - Perfomed by patient: Thread/unthread right sleeve, Put head through opening, Pull shirt over trunk Pull over shirt/dress - Perfomed by helper: Thread/unthread left sleeve        Upper body assist Assist Level: Touching or steadying assistance(Pt > 75%)      Lower Body Dressing/Undressing Lower body dressing   What is the patient wearing?: Socks Underwear - Performed by patient: Thread/unthread right underwear leg Underwear - Performed by helper: Pull underwear up/down, Thread/unthread left underwear leg Pants- Performed by patient: Pull pants up/down, Thread/unthread right pants leg Pants- Performed by helper: Thread/unthread left pants leg Non-skid slipper socks- Performed by patient:  Don/doff left sock, Don/doff right sock Non-skid slipper socks- Performed by helper: Don/doff right sock, Don/doff left sock Socks - Performed by patient: Don/doff right sock, Don/doff left sock                Lower body assist Assist for lower body dressing:  (min A)      Toileting Toileting   Toileting steps completed by patient: Adjust clothing prior to toileting, Adjust clothing  after toileting Toileting steps completed by helper: Performs perineal hygiene Toileting Assistive Devices: Grab bar or rail  Toileting assist Assist level: Touching or steadying assistance (Pt.75%)   Transfers Chair/bed transfer   Chair/bed transfer method: Ambulatory, Stand pivot Chair/bed transfer assist level: Touching or steadying assistance (Pt > 75%) Chair/bed transfer assistive device: Armrests     Locomotion Ambulation     Max distance: 150 Assist level: Supervision or verbal cues   Wheelchair       Assist Level: Dependent (Pt equals 0%)  Cognition Comprehension Comprehension assist level: Understands basic 75 - 89% of the time/ requires cueing 10 - 24% of the time  Expression Expression assist level: Expresses complex 90% of the time/cues < 10% of the time  Social Interaction Social Interaction assist level: Interacts appropriately 25 - 49% of time - Needs frequent redirection.  Problem Solving Problem solving assist level: Solves basic 25 - 49% of the time - needs direction more than half the time to initiate, plan or complete simple activities  Memory Memory assist level: Recognizes or recalls 50 - 74% of the time/requires cueing 25 - 49% of the time    Medical Problem List and Plan: 1. 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 DVT Prophylaxis/Anticoagulation: Pharmaceutical: Heparin, prophyllactic dose, local care for RUE DVT 3. Pain Management: Tylenol prn for pain.  4. Mood: LCSW to follow for evaluation and support.  5. Neuropsych: This patient is capable of making decisions on his own behalf. 6. Skin/Wound Care: Routine pressure relief measures 7. Fluids/Electrolytes/Nutrition: Monitor I/O. Offer supplements between meals.   -bmet reviewed in person and normal today 8. Malignant HTN: Monitor BP every 6 hours as DBP ranging 90- 120.   Continue cozaar  Coreg increased to 25 on 12/31  -bp's  continue to improve 9. CAD: Monitor for symptoms with increase in activity level. Continue coreg and cozaar. ASA/Plavix discontinued. Lipitor on hold due to bleed.  10. ABLA: Monitor for now.   Hb 11.6 on 12/30 11. Hypokalemia: Likely due to IVF.   Supplemented on 12/30 12.  Muscle stiffness: Resolved  K pad ordered 13. Cough: Improved   CXR on 1/1, no acute abnormalities    LOS (Days) 6 A FACE TO FACE EVALUATION WAS PERFORMED  Randall Patel T 04/16/2015 9:06 AM

## 2015-04-16 NOTE — Progress Notes (Signed)
Occupational Therapy Session Note  Patient Details  Name: Randall Patel MRN: [id number] Date of Birth: 08/09/1950  Today's Date: 04/16/2015 OT Individual Time: 1100-1200 OT Individual Time Calculation (min): 60 min    Short Term Goals: Week 1:  OT Short Term Goal 1 (Week 1): Pt will perform UB dressing with min A in order to increase I with self care.  OT Short Term Goal 2 (Week 1): Pt will perform toilet transfer with min A in order to increase I with functional transfer.  OT Short Term Goal 3 (Week 1): Pt will scan to the L and locate items needed for self care tasks with min verbal cues.  OT Short Term Goal 4 (Week 1): Pt will perform LB dressing with mod A in order to decrease assist with self care.   Skilled Therapeutic Interventions/Progress Updates:    1:1 self care retraining at shower level with focus on functional ambulation, attention to left environment and body, dynamic sitting and standing balance, balance reactions, sequencing with more than reasonable amt of time, processing information/ commands with extra time, termination of tasks (extra time to terminate perseveration during a task), functional problem solving with mod A and extra time, functional use of left UE with mod A in a gravity eliminated plane. Pt participated in dressing sitting EOB to challenge dynamic sitting balance and coordination- requiring mod cuing for attention to balance during LB dressing tasks. Pt still required min A for LB dressing. Pt left in recliner with safety belt on.   Pt continues to require extra time to process all commands and to problem solve through basic and familiar ADL tasks.    Therapy Documentation Precautions:  Precautions Precautions: Fall Precaution Comments: L hemiparesis, L inattention Restrictions Weight Bearing Restrictions: No Pain: Pain Assessment Pain Assessment: No/denies pain Pain Score: 0-No pain  See Function Navigator for Current Functional  Status.   Therapy/Group: Individual Therapy  Roney Mans [facility] 04/16/2015, 1:00 PM

## 2015-04-16 NOTE — Consult Note (Signed)
  NEUROBEHAVIORAL STATUS EXAM - CONFIDENTIAL [facility]   MEDICAL NECESSITY:  Randall Patel was seen on the [facility] for a neurobehavioral status exam owing to the patient's diagnosis of cerebral infarction, and to assist in treatment planning during admission.   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 UDS positive for THC.   CT head done revealing large right frontoparietal ICH and he was started on cardene drip and IV mannitol. Chronic ASA/plavix discontinued and Dr. Yetta Barre recommended conservative management with serial CCT.   2D echo done revealing  EF 40-45% with moderate LVH and mild MVR.  Repeat CT head with increase in midline shift and mass effect and mannitol changed to hypertonic saline with goal 150-155.Patient has had confusion with fluctuating bouts of lethargy."      During today's visit, Randall Patel was not the best historian. He denied any cognitive issues and he realizes that this stroke was much worse than the one he suffered a few years ago. He remained fixated on the fact that his providers are telling to look left to account for left neglect. He suffers left motor deficits (upper and lower). He appears to have limited insight into his deficits and a poor appreciation/understanding of his situation and its implications on the future.   From an emotional standpoint, Randall Patel is reportedly coping well but admitted to being somewhat down at times and stressed. Since the stroke, he also experiences spontaneously bouts of crying for no reason. This occurred approximately 6 times during our appointment. He has no history of mental health illness or treatment. No major adjustment issues endorsed. Suicidal/homicidal ideation,  plan or intent was denied. No manic or hypomanic episodes were reported. The patient denied ever experiencing any auditory/visual hallucinations. No major behavioral or personality changes were endorsed.   Patient feels that progress is being made in therapy. No barriers to therapy identified. He described the rehab staff as "good."   PROCEDURES: [2 units 96116] Diagnostic clinical interview  Review of available records [place]'s Cognitive Examination - Mini  MENTAL STATUS: Randall Patel exhibited difficulties with learning and memory, processing speed (as assessed by category fluency), and visuospatial abilities (including left neglect).  He appeared generally oriented to time and place.   IMPRESSION: Overall, Randall Patel denied suffering from any major cognitive issues, but mental status exam suggests the presence of a significant problem. I request that further cognitive evaluation be completed to better ascertain strengths and weaknesses. This can be scheduled tomorrow with my colleague, Dr. Wylene Simmer. 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 Further recommendation will be offered following the more comprehensive cognitive screen.    Debbe Mounts, Psy.D.  Clinical Neuropsychologist

## 2015-04-16 NOTE — Progress Notes (Signed)
Social Work Patient ID: Randall Patel, male   DOB: November 13, 1950, 64 y.o.   MRN: [id number]   Have reviewed team conference info with pt and his his sister, [name].  Both aware and agreeable with targeted d/c date of 1/13 and have scheduled for sister to come in for education on Wed 1/11 @ 1:00.  Continue to follow.  Dawson Albers, LCSW

## 2015-04-16 NOTE — Progress Notes (Signed)
Physical Therapy Session Note  Patient Details  Name: Randall Patel MRN: [id number] Date of Birth: [birth year]  Today's Date: 04/16/2015 PT Individual Time: 0800-0900 PT Individual Time Calculation (min): 60 min   Short Term Goals: Week 1:  PT Short Term Goal 1 (Week 1): Pt will be able to perform functional transfers with min assist level  PT Short Term Goal 2 (Week 1): Pt will be able to gait x 150' with min assist with LRAD PT Short Term Goal 3 (Week 1): Pt will be able to maintain midline positioning of trunk/head during functional activity x 2 min with mod verbal cues  Skilled Therapeutic Interventions/Progress Updates:    Pt received supine in bed, no c/o pain and agreeable to treatment. Supine>sit min A due to poor trunk control and coordination. Seated EOB pt dons B non-skid socks with minA for balance and to keep LLE on R knee to don L sock. Ambulation into bathroom min guard, with one R lateral LOB requiring minA to correct. Seated on EOB pt ate breakfast, min cues for L attention and setup assist to open cups, lids. Standing at sink pt washes face with supervision. Becomes fatigued after several minutes and requests to complete activity seated in w/c. Gait 2x150' with min guard, no AD. No cues required for navigation to gym or room. Berg Balance Scale assessed with results as below. Patient demonstrates increased fall risk as noted by score of   27/56 on Berg Balance Scale.  (<36= high risk for falls, close to 100%; 37-45 significant >80%; 46-51 moderate >50%; 52-55 lower >25%). Returned to room, and seated in w/c. Setup assist to brush teeth with suction brush. Remained seated in w/c with quick release belt in place and all needs in reach at completion of session.    Therapy Documentation Precautions:  Precautions Precautions: Fall Precaution Comments: L hemiparesis, L inattention Restrictions Weight Bearing Restrictions: No  Balance Balance Assessed: Yes Standardized Balance  Assessment Standardized Balance Assessment: Berg Balance Test Berg Balance Test Sit to Stand: Able to stand  independently using hands Standing Unsupported: Able to stand 2 minutes with supervision Sitting with Back Unsupported but Feet Supported on Floor or Stool: Able to sit 2 minutes under supervision Stand to Sit: Uses backs of legs against chair to control descent Transfers: Able to transfer safely, definite need of hands Standing Unsupported with Eyes Closed: Able to stand 10 seconds with supervision Standing Unsupported with Feet Together: Needs help to attain position and unable to hold for 15 seconds From Standing, Reach Forward with Outstretched Arm: Reaches forward but needs supervision From Standing Position, Pick up Object from Floor: Able to pick up shoe, needs supervision From Standing Position, Turn to Look Behind Over each Shoulder: Turn sideways only but maintains balance Turn 360 Degrees: Able to turn 360 degrees safely but slowly Standing Unsupported, Alternately Place Feet on Step/Stool: Able to complete >2 steps/needs minimal assist Standing Unsupported, One Foot in Front: Loses balance while stepping or standing Standing on One Leg: Tries to lift leg/unable to hold 3 seconds but remains standing independently Total Score: 27 Pain: Pain Assessment Pain Assessment: No/denies pain Pain Score: 0-No pain   See Function Navigator for Current Functional Status.   Therapy/Group: Individual Therapy  Vista Lawman 04/16/2015, 9:54 AM

## 2015-04-16 NOTE — Progress Notes (Signed)
Speech Language Pathology Daily Session Note  Patient Details  Name: Randall Patel MRN: [id number] Date of Birth: 1951/02/12  Today's Date: 04/16/2015 SLP Individual Time: 0900-1000 SLP Individual Time Calculation (min): 60 min  Short Term Goals: Week 1: SLP Short Term Goal 1 (Week 1): Pt will consume dys 1 textures and nectar thick liquids with mod assist verbal cues to monitor and correct anterior loss of boluses over 2 targeted sessions.   SLP Short Term Goal 2 (Week 1): Pt will consume trials of dys 2 textures with mod assist verbal cues to monitor and correct left sided buccal residue over 3 targeted sessions prior to advancement.   SLP Short Term Goal 3 (Week 1): Pt will consume therapeutic trials of regular, unthickened water over 2 targeted sessions with min assist verbal cues for use of safe swallowing precautions and minimal overt s/s of aspiration prior to repeat MBS  SLP Short Term Goal 4 (Week 1): Pt will attend to the left environment during basic, familiar tasks with max assist verbal cues.   SLP Short Term Goal 5 (Week 1): Pt will sustain his attention to a basic, familiar task for 3-5 minute intervals with mod assist verbal cues for redirection.    Skilled Therapeutic Interventions: Skilled treatment session focused on dysphagia and cognitive goals. SLP facilitated session by providing Mod A verbal cues for sequencing of multiple swallows and an intermittent throat clear with trials of thin liquids via cup. Patient consumed trials without overt s/s of aspiration, however, recommend patient continue current diet due to constant verbal cues needed for safety. Patient also required Min A verbal cues to self-monitor and correct left anterior spillage of saliva during cognitive tasks. SLP also facilitated session by providing Max A verbal and visual cues for patient to attend to left field of environment, problem solving and selective attention in a mildly distracting environment for ~60  seconds during a basic card sequencing task. Patient attempted to verbally explain a card game to clinician, however, patient required Max A verbal cues for sequencing and thoroughness to rules of task and for emergent awareness of why clinician was unable to neither understand nor play the game effectively. Patient was 100% intelligible throughout session. Patient independently requested to use the bathroom and transferred to the commode with Mod A verbal cues for safety. Patient left on commode with NT present. Continue with current plan of care.    Function:  Eating Eating   Modified Consistency Diet: Yes Eating Assist Level: Supervision or verbal cues;Swallowing techniques: self managed   Eating Set Up Assist For: Opening containers       Cognition Comprehension Comprehension assist level: Understands basic 75 - 89% of the time/ requires cueing 10 - 24% of the time  Expression   Expression assist level: Expresses complex 90% of the time/cues < 10% of the time  Social Interaction Social Interaction assist level: Interacts appropriately 25 - 49% of time - Needs frequent redirection.  Problem Solving Problem solving assist level: Solves basic 25 - 49% of the time - needs direction more than half the time to initiate, plan or complete simple activities  Memory Memory assist level: Recognizes or recalls 50 - 74% of the time/requires cueing 25 - 49% of the time    Pain Pain Assessment Pain Assessment: No/denies pain Pain Score: 0-No pain  Therapy/Group: Individual Therapy  Hudsen Fei 04/16/2015, 12:10 PM

## 2015-04-16 NOTE — Patient Care Conference (Signed)
Inpatient RehabilitationTeam Conference and Plan of Care Update Date: 04/15/2015   Time: 2:35 PM    Patient Name: Randall Patel      Medical Record Number: [id number]  Date of Birth: 1950-04-22 Sex: Male         Room/Bed: 4W21C/4W21C-01 Payor Info: Payor: MEDICARE / Plan: MEDICARE PART A AND B / Product Type: *No Product type* /    Admitting Diagnosis: ich yes slp mod assist   Admit Date/Time:  04/10/2015  3:34 PM Admission Comments: No comment available   Primary Diagnosis:  <principal problem not specified> Principal Problem: <principal problem not specified>  Patient Active Problem List   Diagnosis Date Noted  . Muscle stiffness   . Cough   . Hemiparesis affecting left side as late effect of stroke (HCC)   . Dysphagia as late effect of cerebrovascular disease   . Essential hypertension   . Acute blood loss anemia   . 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 Dysphagia   . Dysarthria due to cerebrovascular accident   . Diastolic dysfunction   . Tobacco abuse   . ETOH abuse   . History of CVA with residual deficit   . Coronary artery disease involving native coronary artery of native heart with angina pectoris (HCC)   . Tachypnea   . Hypernatremia   . Hypokalemia   . Thrombocytopenia (HCC)   . Intracranial hemorrhage (HCC)   . Essential hypertension, malignant   . ICH (intracerebral hemorrhage) (HCC) 04/04/2015  . STEMI (ST elevation myocardial infarction) (HCC) 04/14/2012  . NSTEMI (non-ST elevated myocardial infarction) (HCC) 04/14/2012  . Cardiomyopathy, ischemic 04/14/2012  . Cerebral embolism with cerebral infarction (HCC) 04/03/2012  . Unspecified transient cerebral ischemia 04/02/2012  . Hemiplegia, unspecified, affecting nondominant side 04/02/2012  . CAD (coronary artery disease) s/p stent 2008 in [place] 04/02/2012  . Hypertension     Expected Discharge Date: Expected Discharge Date: 04/25/15  Team Members Present: Physician  leading conference: Dr. Faith Rogue Social Worker Present: Amada Jupiter, LCSW Nurse Present: Carmie End, RN PT Present: Karolee Stamps, Nita Sickle, PT OT Present: Callie Fielding, Lorane Gell, OT SLP Present: Feliberto Gottron, SLP PPS Coordinator present : Tora Duck, RN, CRRN     Current Status/Progress Goal Weekly Team Focus  Medical   right CVA with dense left upper HP, bp better controlled.   optimize activity tolerance  bp control, stroke education   Bowel/Bladder   continent of bowel and bladder; LBM 04/14/15  Remain continent of bowel and bladder  toilet q 3hr   Swallow/Nutrition/ Hydration   Dys. 1 textures with nectar-thick liquids, Min-Mod A for use of swallowing strategies   Min A with least restrictive diet   increased use of swallowing compensatory strategies, trials of upgraded textures/liquids, repeat MBS    ADL's   grooming - set up A. Mod A for UB and LB self care as well as bathing. Shower and toilet transfer with min A. 0/5 in L UE throughout.   supervision overall  transfer training, self care retraining, L NMR, pt/family education   Mobility   min to light mod assist for transfers, gait, balance, and stairs  supervision to min assist overall  L NMR and attention, postural control, balance training, gait, stairs, safety awareness, initiation, cognition   Communication   Min A  Supervision   increased use of speech intelligibility strategies    Safety/Cognition/ Behavioral Observations  Max A   Min A   attention,  attention to left field of enviornment, problem solving, awareness    Pain   No current comlaints of pain  <2 on a 0-10 scale  assess pain q 4hr and prn   Skin   CDI  remain free from breakdown while on rehab  assess skin  q shift and prn    Rehab Goals Patient on target to meet rehab goals: Yes *See Care Plan and progress notes for long and short-term goals.  Barriers to Discharge: left hemiparesis    Possible Resolutions to Barriers:   adaptive equipoment, NMR, family ed    Discharge Planning/Teaching Needs:  Plan to d/c home with sister and other family who can provide 24/7 assistance.  To be scheduled still.   Team Discussion:  Very poor cognition.  ST does not feel there is a language component.  Supervision goals overall but likely will need max cues for safety.  Referring to neuropsych for additional cog testing.    Revisions to Treatment Plan:  None   Continued Need for Acute Rehabilitation Level of Care: The patient requires daily medical management by a physician with specialized training in physical medicine and rehabilitation for the following conditions: Daily direction of a multidisciplinary physical rehabilitation program to ensure safe treatment while eliciting the highest outcome that is of practical value to the patient.: Yes Daily medical management of patient stability for increased activity during participation in an intensive rehabilitation regime.: Yes Daily analysis of laboratory values and/or radiology reports with any subsequent need for medication adjustment of medical intervention for : Other;Neurological problems;Post surgical problems  Teige Rountree 04/16/2015, 10:56 AM

## 2015-04-16 NOTE — Progress Notes (Addendum)
Physical Therapy Session Note  Patient Details  Name: Randall Patel MRN: [id number] Date of Birth: [birth year]  Today's Date: 04/16/2015 PT Individual Time: 1535-1605 PT Individual Time Calculation (min): 30 min      Skilled Therapeutic Interventions/Progress Updates: pt stated he needed to use toilet.  VCs for sequencing for bed mobility to sit up from R side.  Pt does not attend to LUE or LLE unless cued moderate, throughout session. Gait in room without AD to/from toilet. Pt voided in standing, then sat on toilet to attempt BM, but no success. neuromuscular re-education via forced use, VCs, manual cues for standing with = wt bearing for: 10 x 1 each  mini squats, calf raises, assisted L hip abduction, bilateral and 2 x 10 alternating toe raises. PT returned pt to bed and set bed alarm; all needs within reach.    Therapy Documentation Precautions:  Precautions Precautions: Fall Precaution Comments: L hemiparesis, L inattention Restrictions Weight Bearing Restrictions: No      See Function Navigator for Current Functional Status.   Therapy/Group: Individual Therapy  [name] 04/16/2015, 4:17 PM

## 2015-04-17 ENCOUNTER — Inpatient Hospital Stay (HOSPITAL_COMMUNITY): Payer: Medicare Other | Admitting: Occupational Therapy

## 2015-04-17 ENCOUNTER — Inpatient Hospital Stay (HOSPITAL_COMMUNITY): Payer: Medicare Other | Admitting: Speech Pathology

## 2015-04-17 ENCOUNTER — Inpatient Hospital Stay (HOSPITAL_COMMUNITY): Payer: Medicare Other

## 2015-04-17 ENCOUNTER — Encounter (HOSPITAL_COMMUNITY): Payer: Medicare Other

## 2015-04-17 MED ORDER — SENNOSIDES-DOCUSATE SODIUM 8.6-50 MG PO TABS
1.0000 | ORAL_TABLET | Freq: Every day | ORAL | Status: DC | PRN
  Filled 2015-04-17: qty 1

## 2015-04-17 MED ORDER — SENNOSIDES-DOCUSATE SODIUM 8.6-50 MG PO TABS
1.0000 | ORAL_TABLET | Freq: Every day | ORAL | Status: DC
  Administered 2015-04-18 – 2015-04-24 (×7): 1 via ORAL
  Filled 2015-04-17 (×5): qty 1

## 2015-04-17 NOTE — Progress Notes (Signed)
[facility]ICINE & REHABILITATION     PROGRESS NOTE    Subjective/Complaints:  Good night sleep. Asleep when i arrived in the room. Denies pain. No new complaints  ROS: Denies CP, SOB, n/v/d, fevers, chills. No anxiety, visual issues  Objective: Vital Signs: Blood pressure 143/81, pulse 50, temperature 97.9 F (36.6 C), temperature source Oral, resp. rate 20, height  (1.651 m), weight 75.3 kg (166 lb 0.1 oz), SpO2 99 %. No results found. No results for input(s): WBC, HGB, HCT, PLT in the last 72 hours.  Recent Labs  04/15/15 0427  NA 139  K 4.1  CL 107  GLUCOSE 93  BUN 8  CREATININE 0.97  CALCIUM 9.4   CBG (last 3)  No results for input(s): GLUCAP in the last 72 hours.  Wt Readings from Last 3 Encounters:  04/16/15 75.3 kg (166 lb 0.1 oz)  04/04/15 71.1 kg (156 lb 12 oz)  02/20/15 74.844 kg (165 lb)    Physical Exam:  BP 143/81 mmHg  Pulse 50  Temp(Src) 97.9 F (36.6 C) (Oral)  Resp 20  Ht  (1.651 m)  Wt 75.3 kg (166 lb 0.1 oz)  BMI 27.62 kg/m2  SpO2 99% Constitutional: He appears well-developed and well-nourished. Vitals reviewed. HENT: Normocephalic and atraumatic.  Eyes: Conjunctivae and EOM are normal.  Neck: Normal range of motion. Neck supple.  Cardiovascular: Normal rate and regular rhythm.  Respiratory: Effort normal and breath sounds normal. No respiratory distress. He has no wheezes.  GI: Soft. Bowel sounds are normal. He exhibits no distension. There is no tenderness.  Musculoskeletal: He exhibits no edema. TTP over posterior neck.  Neurological: He is alert and oriented. Coordination abnormal.  Left facial weakness with mild dysarthria. left inattention Able to follow commands.  RUE 4/5 delt, 4/5 Biceps due to pain 5/5 triceps and grip 1/5 delt, pec, bicep, tricep, 0/5 wrist/hand 3-4/5 L KE, 3- to 3/5 L HF and 3 ADF 5/5 in RLE Sensation intact BUE and BLE  Skin: Skin is warm and dry.  Psych: affect very  flat  Assessment/Plan: 1. Functional deficits secondary to Right frontal hypertensive ICH which require 3+ hours per day of interdisciplinary therapy in a comprehensive inpatient rehab setting. Physiatrist is providing close team supervision and 24 hour management of active medical problems listed below. Physiatrist and rehab team continue to assess barriers to discharge/monitor patient progress toward functional and medical goals.  Function:  Bathing Bathing position   Position: Shower  Bathing parts Body parts bathed by patient: Left arm, Chest, Front perineal area, Abdomen, Right upper leg, Left upper leg, Buttocks Body parts bathed by helper: Left lower leg, Right lower leg, Right arm  Bathing assist Assist Level:  (mod A)      Upper Body Dressing/Undressing Upper body dressing   What is the patient wearing?: Pull over shirt/dress     Pull over shirt/dress - Perfomed by patient: Thread/unthread right sleeve, Put head through opening, Pull shirt over trunk Pull over shirt/dress - Perfomed by helper: Thread/unthread left sleeve        Upper body assist Assist Level: Touching or steadying assistance(Pt > 75%)      Lower Body Dressing/Undressing Lower body dressing   What is the patient wearing?: Socks Underwear - Performed by patient: Thread/unthread right underwear leg Underwear - Performed by helper: Pull underwear up/down, Thread/unthread left underwear leg Pants- Performed by patient: Pull pants up/down, Thread/unthread right pants leg Pants- Performed by helper: Thread/unthread left pants leg  Non-skid slipper socks- Performed by patient: Don/doff left sock, Don/doff right sock Non-skid slipper socks- Performed by helper: Don/doff right sock, Don/doff left sock Socks - Performed by patient: Don/doff right sock, Don/doff left sock                Lower body assist Assist for lower body dressing:  (min A)      Toileting Toileting   Toileting steps completed by  patient: Adjust clothing prior to toileting, Adjust clothing after toileting Toileting steps completed by helper: Performs perineal hygiene Toileting Assistive Devices: Grab bar or rail  Toileting assist Assist level: Supervision or verbal cues   Transfers Chair/bed transfer   Chair/bed transfer method: Ambulatory, Stand pivot Chair/bed transfer assist level: Touching or steadying assistance (Pt > 75%) Chair/bed transfer assistive device: Armrests     Locomotion Ambulation     Max distance: 20 Assist level: Touching or steadying assistance (Pt > 75%)   Wheelchair       Assist Level: Dependent (Pt equals 0%)  Cognition Comprehension Comprehension assist level: Understands basic 75 - 89% of the time/ requires cueing 10 - 24% of the time  Expression Expression assist level: Expresses basic 90% of the time/requires cueing < 10% of the time.  Social Interaction Social Interaction assist level: Interacts appropriately 25 - 49% of time - Needs frequent redirection.  Problem Solving Problem solving assist level: Solves basic 25 - 49% of the time - needs direction more than half the time to initiate, plan or complete simple activities  Memory Memory assist level: Recognizes or recalls less than 25% of the time/requires cueing greater than 75% of the time    Medical Problem List and Plan: 1. 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 DVT Prophylaxis/Anticoagulation: Pharmaceutical: Heparin, prophyllactic dose, local care for RUE DVT 3. Pain Management: Tylenol prn for pain.  4. Mood: LCSW to follow for evaluation and support.  5. Neuropsych: This patient is capable of making decisions on his own behalf. 6. Skin/Wound Care: Routine pressure relief measures 7. Fluids/Electrolytes/Nutrition: Monitor I/O. Offer supplements between meals.   -bmet reviewed in person and normal today 8. Malignant HTN: Monitor BP every 6 hours as  DBP ranging 90- 120.   Continue cozaar  Coreg increased to 25 on 12/31  -bp's continue to improve 9. CAD: Monitor for symptoms with increase in activity level. Continue coreg and cozaar. ASA/Plavix discontinued. Lipitor on hold due to bleed.  10. ABLA: Monitor for now.   Hb 11.6 on 12/30 11. Hypokalemia: Likely due to IVF.   Supplemented on 12/30 12.  Muscle stiffness: Resolved  K pad ordered 13. Cough: Improved   CXR on 1/1, no acute abnormalities    -still coughs to clear throat after swallowing  LOS (Days) 7 A FACE TO FACE EVALUATION WAS PERFORMED  Jaicion Laurie T 04/17/2015 9:21 AM

## 2015-04-17 NOTE — Progress Notes (Signed)
Occupational Therapy Session Note  Patient Details  Name: Randall Patel MRN: [id number] Date of Birth: January 18, 1951  Today's Date: 04/17/2015 OT Individual Time: 1100-1200 OT Individual Time Calculation (min): 60 min    Short Term Goals: Week 1:  OT Short Term Goal 1 (Week 1): Pt will perform UB dressing with min A in order to increase I with self care.  OT Short Term Goal 2 (Week 1): Pt will perform toilet transfer with min A in order to increase I with functional transfer.  OT Short Term Goal 3 (Week 1): Pt will scan to the L and locate items needed for self care tasks with min verbal cues.  OT Short Term Goal 4 (Week 1): Pt will perform LB dressing with mod A in order to decrease assist with self care.   Skilled Therapeutic Interventions/Progress Updates:    1:1  Self care retraining at shower level. Focus on functional problem solving in a more timely manner, sequencing, correction of errors made in ADL tasks, functional ambulation around room with attention to left, eye contact during social conversations, standing balance, attention to left foot placement with dynamic sit to standing, hemi dressing techniques, processing speed.  Pt continues to require more than reasonable amt of time to perform ADL tasks due to decr attention to task, delayed processing, easily distracted internally and decr attention to left requiring mod cuing to complete tasks. Pt required 25 min to don shirt today with only minimal cues- not  timeefficient for a caregiver at home. Pt still requires mod cuing for attention to left body and environment.  Pt walking into the shower (but not wet) before he realized he still had a shirt on he needed to doff (without cuing).   Therapy Documentation Precautions:  Precautions Precautions: Fall Precaution Comments: L hemiparesis, L inattention Restrictions Weight Bearing Restrictions: No Pain: Pain Assessment Pain Assessment: No/denies pain  See Function Navigator for  Current Functional Status.   Therapy/Group: Individual Therapy  Roney Mans [facility] 04/17/2015, 12:17 PM

## 2015-04-17 NOTE — Progress Notes (Signed)
Physical Therapy Session Note  Patient Details  Name: Randall Patel MRN: [id number] Date of Birth: Jan 27, 1951  Today's Date: 04/17/2015 PT Individual Time: 0800-0930 PT Individual Time Calculation (min): 90 min   Short Term Goals: Week 1:  PT Short Term Goal 1 (Week 1): Pt will be able to perform functional transfers with min assist level  PT Short Term Goal 2 (Week 1): Pt will be able to gait x 150' with min assist with LRAD PT Short Term Goal 3 (Week 1): Pt will be able to maintain midline positioning of trunk/head during functional activity x 2 min with mod verbal cues  Skilled Therapeutic Interventions/Progress Updates: pt reported he needed to sit on toilet. Gait in room to/from toilet and sink without AD, close supervision, VCS for safety for obstacles on  L. multimodal cues for L attention during toileting/transfer. Pt's attention to L is brief requiring continual cueing. Gait in hallway and carpeted dayroom, with close supervision for drifting L, VCs for better L foot clearance  neuromuscular re-education  Via VCS, manual and tactile cues for L attention, use of L hand for gross assist, forced use, midline orientation during eating breafast sitting EOB, NuStep at level 5 x 5 min 4 extremities and x 5 min LEs.  Sit><stand without use of UEs, biased to L for increased wt bearing, x 2, and standing during L knee stance control activity.  For prolonged stretch bil hamstrings and heel cords and balance challenge, pt stood on wedge x 1.5 min. Pt reported he needed to use toilet again for BM. Toileting and transfer as above. [place] A exs in standing x 10 each: bil calf raises, mini squats, L/R hamstring curls.  Son Randall Patel observed some of tx; PT suggested he sit on pt's L when in room with him, and remind his father to look L when he is unable to find something visually.   Pt left sitting in w/c with quick release belt donned, and all needs within reach.    Therapy Documentation Precautions:   Precautions Precautions: Fall Precaution Comments: L hemiparesis, L inattention Restrictions Weight Bearing Restrictions: No   Vital Signs: Therapy Vitals Temp: 98.5 F (36.9 C) Pain: Pain Assessment Pain Assessment: No/denies pain    See Function Navigator for Current Functional Status.   Therapy/Group: Individual Therapy  Kalman Nylen 04/17/2015, 4:55 PM

## 2015-04-17 NOTE — Progress Notes (Signed)
Speech Language Pathology Daily Session Note  Patient Details  Name: Randall Patel MRN: [id number] Date of Birth: 1950-10-27  Today's Date: 04/17/2015 SLP Individual Time: 1300-1400 SLP Individual Time Calculation (min): 60 min  Short Term Goals: Week 1: SLP Short Term Goal 1 (Week 1): Pt will consume dys 1 textures and nectar thick liquids with mod assist verbal cues to monitor and correct anterior loss of boluses over 2 targeted sessions.   SLP Short Term Goal 2 (Week 1): Pt will consume trials of dys 2 textures with mod assist verbal cues to monitor and correct left sided buccal residue over 3 targeted sessions prior to advancement.   SLP Short Term Goal 3 (Week 1): Pt will consume therapeutic trials of regular, unthickened water over 2 targeted sessions with min assist verbal cues for use of safe swallowing precautions and minimal overt s/s of aspiration prior to repeat MBS  SLP Short Term Goal 4 (Week 1): Pt will attend to the left environment during basic, familiar tasks with max assist verbal cues.   SLP Short Term Goal 5 (Week 1): Pt will sustain his attention to a basic, familiar task for 3-5 minute intervals with mod assist verbal cues for redirection.    Skilled Therapeutic Interventions: Skilled treatment session focused on cognitive and dysphagia goals. Upon arrival, patient was awake while supine in bed and was agreeable to participate in treatment session. Patient was transferred to the wheelchair and required Mod A verbal cues for safety in regards to attention to the left side of his body and sustained attention to task. Patient independently request to rinse his mouth with mouthwash and demonstrate a large amount of anterior spillage due to attempting to talk while liquid in mouth, no overt s/s of aspiration noted. Patient voided while standing at commode and continued to require Max A verbal cues for problem solving with task. SLP also facilitated session by providing set-up with  lunch meal of Dys. 1 textures with nectar-thick liquids. Patient consumed meal without overt s/s of aspiration and was Mod I for use of swallowing compensatory strategies. Patient attended to self-feeding for ~20 minutes with supervision verbal cues needed for redirection. Patient also consumed trials of Dys. 2 textures and demonstrated mildly prolonged but efficient mastication and independently utilized a liquid wash to clear minimal oral residue. Recommend continued trials with SLP only. Patient transferred back to bed at end of session and left with bed alarm on and all needs within reach. Continue with current plan of care.    Function:  Eating Eating   Modified Consistency Diet: Yes Eating Assist Level: Supervision or verbal cues;Swallowing techniques: self managed   Eating Set Up Assist For: Opening containers       Cognition Comprehension Comprehension assist level: Understands basic 75 - 89% of the time/ requires cueing 10 - 24% of the time  Expression   Expression assist level: Expresses basic 90% of the time/requires cueing < 10% of the time.  Social Interaction Social Interaction assist level: Interacts appropriately 25 - 49% of time - Needs frequent redirection.  Problem Solving Problem solving assist level: Solves basic 25 - 49% of the time - needs direction more than half the time to initiate, plan or complete simple activities  Memory Memory assist level: Recognizes or recalls 50 - 74% of the time/requires cueing 25 - 49% of the time    Pain Pain Assessment Pain Assessment: No/denies pain  Therapy/Group: Individual Therapy  Elnor Renovato 04/17/2015, 4:52 PM

## 2015-04-17 NOTE — Progress Notes (Signed)
Nutrition Follow-up  DOCUMENTATION CODES:   Not applicable  INTERVENTION:  Provide Ensure Enlive po BID, each supplement provides 350 kcal and 20 grams of protein.  Encourage adequate PO intake.  NUTRITION DIAGNOSIS:   Inadequate oral intake related to poor appetite as evidenced by per patient/family report; intake improving  GOAL:   Patient will meet greater than or equal to 90% of their needs; met  MONITOR:   PO intake, Supplement acceptance, Diet advancement, Weight trends, Labs, I & O's  REASON FOR ASSESSMENT:   Malnutrition Screening Tool    ASSESSMENT:   65 y.o. 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 Presented 04/04/2015 with headache and blood pressure 189/128. Denies any recent trauma. Cranial CT scan showed a large right frontoparietal intracerebral hemorrhage. Follow-up cranial CT scan 04/07/2015 with evolving right frontal intraparenchymal hematoma right to left midline shift without ventricular entrapment.  Meal completion has been varied from 25-100%, with most recent po intake of 75-90%. Pt currently has Ensure ordered and has been consuming them. RD to modify orders to BID as intake has been improving.   Diet Order:  DIET - DYS 1 Room service appropriate?: Yes; Fluid consistency:: Nectar Thick  Skin:  Reviewed, no issues  Last BM:  1/4  Height:   Ht Readings from Last 1 Encounters:  04/10/15 '5\' 5"'  (1.651 m)    Weight:   Wt Readings from Last 1 Encounters:  04/16/15 166 lb 0.1 oz (75.3 kg)    Ideal Body Weight:  61.8 kg  BMI:  Body mass index is 27.62 kg/(m^2).  Estimated Nutritional Needs:   Kcal:  1900-2100  Protein:  90-100 grams  Fluid:  1.9 - 2.1 L/day  EDUCATION NEEDS:   No education needs identified at this time  Corrin Parker, MS, RD, LDN Pager # [phone] After hours/ weekend pager # [phone]

## 2015-04-18 ENCOUNTER — Inpatient Hospital Stay (HOSPITAL_COMMUNITY): Payer: Medicare Other

## 2015-04-18 ENCOUNTER — Inpatient Hospital Stay (HOSPITAL_COMMUNITY): Payer: Medicare Other | Admitting: Speech Pathology

## 2015-04-18 ENCOUNTER — Inpatient Hospital Stay (HOSPITAL_COMMUNITY): Payer: Medicare Other | Admitting: Physical Therapy

## 2015-04-18 NOTE — Progress Notes (Signed)
Occupational Therapy Session Note  Patient Details  Name: Randall Patel MRN: [id number] Date of Birth: 1951/01/27  Today's Date: 04/18/2015 OT Individual Time: 1330-1400 OT Individual Time Calculation (min): 30 min    Short Term Goals: Week 1:  OT Short Term Goal 1 (Week 1): Pt will perform UB dressing with min A in order to increase I with self care.  OT Short Term Goal 2 (Week 1): Pt will perform toilet transfer with min A in order to increase I with functional transfer.  OT Short Term Goal 3 (Week 1): Pt will scan to the L and locate items needed for self care tasks with min verbal cues.  OT Short Term Goal 4 (Week 1): Pt will perform LB dressing with mod A in order to decrease assist with self care.   Skilled Therapeutic Interventions/Progress Updates: Pt received supine in bed with c/o pain at buttocks from prolonged sitting in w/c.   Pt requested assist to toilet and ambulated with only handheld guidance and min guard assist.  Pt voided urine standing with steadying/contact guard and then he felt need for BM.   Pt required min assist to manage clothing d/t mild impulsivity but remained on toilet using grab bars for stability unassisted while attempting to pass BM (unproductive).    Pt ambulated back to sink and groomed with min guard assist and instructional cues on management of LUE.   Pt recived by physical therapist during end of grooming (oral care).     Therapy Documentation Precautions:  Precautions Precautions: Fall Precaution Comments: L hemiparesis, L inattention Restrictions Weight Bearing Restrictions: No   Pain: Pain Assessment Pain Assessment: No/denies pain Pain Score: 0-No pain  See Function Navigator for Current Functional Status.   Therapy/Group: Individual Therapy  [name] 04/18/2015, 2:48 PM

## 2015-04-18 NOTE — Progress Notes (Signed)
Speech Language Pathology Note  Patient Details  Name: Randall Patel MRN: [id number] Date of Birth: 1950-08-23 Today's Date: 04/18/2015  MBSS complete. Full report located under chart review in imaging section.    Avrie Kedzierski 04/18/2015, 12:40 PM

## 2015-04-18 NOTE — Progress Notes (Signed)
Occupational Therapy Session Note  Patient Details  Name: Randall Patel MRN: [id number] Date of Birth: [birth year]  Today's Date: 04/18/2015 OT Individual Time: 1000-1100 OT Individual Time Calculation (min): 60 min    Short Term Goals: Week 1:  OT Short Term Goal 1 (Week 1): Pt will perform UB dressing with min A in order to increase I with self care.  OT Short Term Goal 2 (Week 1): Pt will perform toilet transfer with min A in order to increase I with functional transfer.  OT Short Term Goal 3 (Week 1): Pt will scan to the L and locate items needed for self care tasks with min verbal cues.  OT Short Term Goal 4 (Week 1): Pt will perform LB dressing with mod A in order to decrease assist with self care.   Skilled Therapeutic Interventions/Progress Updates:    Pt engaged in BADL retraining including bathing at shower level and dressing with sit<>stand from w/c at sink.  Pt amb in room to gather clothing prior to amb into bathroom and use toilet (void while standing) and transfer to shower seat.  Pt noted with L inattention and ran into door entering and exiting bathroom.  Pt noted with impulsivity and required max verbal cues for safety awareness and mod verbal cues for sequencing during bathing tasks.  Pt requires more than a reasonable amount of time with dressing tasks, particularly with orienting shirt and pants correctly prior to attempting to don.  Pt required 15 mins to don shirt but was able to complete task without assistance (mod verbal cues for orientation of shirt). Focus on activity tolerance, attention to left visual field, sit<>stand, standing balance, functional amb without AD, sequencing, and safety awareness to increase independence with BADLs.   Therapy Documentation Precautions:  Precautions Precautions: Fall Precaution Comments: L hemiparesis, L inattention Restrictions Weight Bearing Restrictions: No Pain:  Pt denies pain  See Function Navigator for Current Functional  Status.   Therapy/Group: Individual Therapy  Rich Brave 04/18/2015, 11:05 AM

## 2015-04-18 NOTE — Consult Note (Signed)
NEUROCOGNITIVE TESTING - [facility]   [name] is a 65 year old man, who was previously seen for a neurobehavioral status exam in the setting of stroke.  In brief, that consultation revealed an impaired mental status and therefore, despite [name] denial of noticing cognitive changes, a more thorough neuropsychological screen was requested.  He was also observed to have labile affect with sudden bouts of crying and Randall Patel was recommended for treatment of pseudobulbar affect.  The current neurocognitive testing was conducted per above recommendation to more thoroughly examine [name] cognitive strengths and weaknesses in order to both track his cognitive recovery over time and to provide more specific recommendations for his care.    PROCEDURES: [2 units of 95621 on 04/17/15]  The following tests were performed during today's visit: Repeatable Battery for the Assessment of Neuropsychological Status (RBANS, form C), Geriatric Anxiety Inventory and the Geriatric Depression Scale (short form).  Test results are as follows:   RBANS Indices Scaled Score Percentile Description  Immediate Memory  73 4 Impaired  Visuospatial/Constructional 50 < 1 Profoundly Impaired  Language 68 2 Profoundly Impaired  Attention 60 < 1 Profoundly Impaired  Delayed Memory 78 7 Impaired  Total Score 56 < 1 Profoundly Impaired   RBANS Subtests Raw Score Percentile Description  List Learning 19 2 Profoundly Impaired  Story Memory 13 6 Impaired  Figure Copy 10 < 1 Profoundly Impaired  Line Orientation 4 < 1 Profoundly Impaired  Picture Naming 7 < 1 Profoundly Impaired  Semantic Fluency 15 9 Below Average  Digit Span 8 14 Below Average  Coding 0 < 1 Profoundly Impaired  List Recall 4 18 Below Average  List Recognition 18 12 Below Average  Story Recall 6 5 Impaired  Figure recall 6 3 Impaired   GDS - short Raw Score = 3 Description = WNL  GAI Raw Score = 1  Description = WNL   Randall Patel score on an embedded measure of performance validity was within normal limits, suggesting optimal task engagement.  Moreover, there were no behavioral manifestations to suggest suboptimal effort.  Therefore, the current test results likely indicate an accurate portrayal of his current level of cognitive functioning.    Behaviorally, Randall Patel displayed a flat affect throughout the evaluation.  He was often slow to respond to prompts and it was frequently unclear whether he was thinking about responses or if he was done and awaiting further instructions from the examiner.  He required prompting to indicate when he was finished providing answers.  Test results revealed impairment across cognitive domains.  Relative strengths were seen on subtests requiring semantic fluency, simple attention, and rote memorization/recall.  The impairments documented herein are at the level of a Major Neurocognitive Disorder (i.e. dementia).  The scattered pattern of deficits, with significant visual disruption and executive functioning problems is consistent with deficits resulting from recent and remote strokes.    From an emotional standpoint, he did not endorse items suggestive of clinically significant depression or anxiety at this time.  No emotional lability was observed during the current session.  Randall Patel noted that his only concern at this time was that his testosterone level was low; he requested to speak to his physician regarding this.    In light of these findings, the following recommendations are provided.   RECOMMENDATIONS:  Recommendations for treatment team:   . When interacting with Randall Patel, directions and information should be provided in a simple, straight forward manner, and the treatment  team should avoid giving multiple instructions simultaneously.  . Given his flat affect and slow responses, staff members may benefit from asking [name] to repeat  instructions aloud so they can ensure that he understands before moving forward during sessions.   Randall Patel Kitchen Despite [name] flat affect, there was no evidence of clinically significant mood disruption.  Therefore, staff should be aware that his affect may not match his emotional experience and should rely solely on his report of mood, rather than interpreting affect displayed, which has likely been impacted by stroke.   . [name] may also benefit from being provided with multiple trials to learn new skills given the noted memory inefficiencies.  . To the extent possible, multitasking should be avoided. . Performance will generally be best in a structured, routine, and familiar environment, as opposed to situations involving complex problems.   Recommendations for discharge planning:  . Complete a comprehensive neuropsychological evaluation as an outpatient in 6-12 months to assess for interval change. . Maintain engagement in mentally, physically and cognitively stimulating activities.  . Strive to maintain a healthy lifestyle (e.g., proper diet and exercise) in order to promote physical, cognitive and emotional health.   Randall Patel, Psy.D.  Clinical Neuropsychologist

## 2015-04-18 NOTE — Progress Notes (Addendum)
Physical Therapy Session Note  Patient Details  Name: Randall Patel MRN: [id number] Date of Birth: January 04, 1951  Today's Date: 04/18/2015 PT Individual Time: 1400-1500 PT Individual Time Calculation (min): 60 min   Short Term Goals: Week 1:  PT Short Term Goal 1 (Week 1): Pt will be able to perform functional transfers with min assist level  PT Short Term Goal 2 (Week 1): Pt will be able to gait x 150' with min assist with LRAD PT Short Term Goal 3 (Week 1): Pt will be able to maintain midline positioning of trunk/head during functional activity x 2 min with mod verbal cues  Skilled Therapeutic Interventions/Progress Updates:    Pt received standing at sink with OT finishing teeth brushing; no c/o pain and agreeable to treatment. Seated in w/c, pt dons B shoes with modA. Transported to gym totalA for energy conservation. Therapist exchanged w/c for smaller size and different cushion to better fit pt with c/o discomfort in current chair. During transfer from first w/c to new one, pt required mod verbal cues for attention to L arm, safety and sequencing. Remainder of session focused on LUE weight bearing with pt focus and perseverating on LUE function. Educated pt on wight bearing as a method to improve proprioception, strength, coordination. Modified plantigrade with RUE reaching to clothespin tree with facilitation and weight bearing at LUE. In same position, performed LUE reaching to numbered targets to facilitate L attention and visual scanning, with continued LUE weight bearing. Quadruped x3 trials of approximately 2 min each while reaching with RUE to numbered targets. Numbers placed out of order to continue challenging visual scanning and L attention, with no significant improvement of L scanning/attention speed to locate numbered targets over several trials. Returned to room with ambulation while pushing w/c; therapist assisted with maintaining LUE on w/c. Remained seated in w/c with quick release  belt intact and all needs in reach.      Therapy Documentation Precautions:  Precautions Precautions: Fall Precaution Comments: L hemiparesis, L inattention Restrictions Weight Bearing Restrictions: No Pain: Pain Assessment Pain Assessment: No/denies pain Pain Score: 0-No pain   See Function Navigator for Current Functional Status.   Therapy/Group: Individual Therapy  Vista Lawman 04/18/2015, 3:56 PM  Addendum to add time calculation

## 2015-04-18 NOTE — Progress Notes (Signed)
[place] [facility]     PROGRESS NOTE    Subjective/Complaints:  Sitting in chair. No complaints. "my arm isn't moving yet"  ROS: Denies CP, SOB, n/v/d, fevers, chills. No anxiety, visual issues.   Objective: Vital Signs: Blood pressure 133/79, pulse 63, temperature 98.6 F (37 C), temperature source Oral, resp. rate 16, height  (1.651 m), weight 75.3 kg (166 lb 0.1 oz), SpO2 99 %. No results found. No results for input(s): WBC, HGB, HCT, PLT in the last 72 hours. No results for input(s): NA, K, CL, GLUCOSE, BUN, CREATININE, CALCIUM in the last 72 hours.  Invalid input(s): CO CBG (last 3)  No results for input(s): GLUCAP in the last 72 hours.  Wt Readings from Last 3 Encounters:  04/16/15 75.3 kg (166 lb 0.1 oz)  04/04/15 71.1 kg (156 lb 12 oz)  02/20/15 74.844 kg (165 lb)    Physical Exam:  BP 133/79 mmHg  Pulse 63  Temp(Src) 98.6 F (37 C) (Oral)  Resp 16  Ht  (1.651 m)  Wt 75.3 kg (166 lb 0.1 oz)  BMI 27.62 kg/m2  SpO2 99% Constitutional: He appears well-developed and well-nourished. Vitals reviewed. HENT: Normocephalic and atraumatic.  Eyes: Conjunctivae and EOM are normal.  Neck: Normal range of motion. Neck supple.  Cardiovascular: Normal rate and regular rhythm.  Respiratory: Effort normal and breath sounds normal. No respiratory distress. He has no wheezes.  GI: Soft. Bowel sounds are normal. He exhibits no distension. There is no tenderness.  Musculoskeletal: He exhibits no edema. TTP over posterior neck.  Neurological: He is alert and oriented. Coordination abnormal.  Left facial weakness with mild dysarthria. left inattention Able to follow commands.  RUE 4/5 delt, 4/5 Biceps due to pain 5/5 triceps and grip 1/5 delt, pec, bicep, tricep, 0/5 wrist/hand 3-4/5 L KE, 3- to 3/5 L HF and 3 ADF---exam unchanged 5/5 in RLE Sensation intact BUE and BLE  Skin: Skin is warm and dry.  Psych: affect very  flat  Assessment/Plan: 1. Functional deficits secondary to Right frontal hypertensive ICH which require 3+ hours per day of interdisciplinary therapy in a comprehensive inpatient rehab setting. Physiatrist is providing close team supervision and 24 hour management of active medical problems listed below. Physiatrist and rehab team continue to assess barriers to discharge/monitor patient progress toward functional and medical goals.  Function:  Bathing Bathing position   Position: Shower  Bathing parts Body parts bathed by patient: Left arm, Chest, Front perineal area, Abdomen, Right upper leg, Left upper leg, Buttocks Body parts bathed by helper: Left lower leg, Right lower leg, Right arm  Bathing assist Assist Level:  (mod A)      Upper Body Dressing/Undressing Upper body dressing   What is the patient wearing?: Pull over shirt/dress     Pull over shirt/dress - Perfomed by patient: Thread/unthread right sleeve, Put head through opening, Pull shirt over trunk Pull over shirt/dress - Perfomed by helper: Thread/unthread left sleeve        Upper body assist Assist Level: Touching or steadying assistance(Pt > 75%)      Lower Body Dressing/Undressing Lower body dressing   What is the patient wearing?: Socks Underwear - Performed by patient: Thread/unthread right underwear leg Underwear - Performed by helper: Pull underwear up/down, Thread/unthread left underwear leg Pants- Performed by patient: Pull pants up/down, Thread/unthread right pants leg Pants- Performed by helper: Thread/unthread left pants leg Non-skid slipper socks- Performed by patient: Don/doff left sock, Don/doff right sock  Non-skid slipper socks- Performed by helper: Don/doff right sock, Don/doff left sock Socks - Performed by patient: Don/doff right sock, Don/doff left sock                Lower body assist Assist for lower body dressing:  (min A)      Toileting Toileting   Toileting steps completed by  patient: Adjust clothing prior to toileting, Adjust clothing after toileting Toileting steps completed by helper: Performs perineal hygiene Toileting Assistive Devices: Grab bar or rail  Toileting assist Assist level: Supervision or verbal cues   Transfers Chair/bed transfer   Chair/bed transfer method: Ambulatory, Stand pivot Chair/bed transfer assist level: Touching or steadying assistance (Pt > 75%) Chair/bed transfer assistive device: Armrests     Locomotion Ambulation     Max distance: 150 Assist level: Touching or steadying assistance (Pt > 75%)   Wheelchair       Assist Level: Dependent (Pt equals 0%)  Cognition Comprehension Comprehension assist level: Understands basic 75 - 89% of the time/ requires cueing 10 - 24% of the time  Expression Expression assist level: Expresses basic 90% of the time/requires cueing < 10% of the time.  Social Interaction Social Interaction assist level: Interacts appropriately 25 - 49% of time - Needs frequent redirection.  Problem Solving Problem solving assist level: Solves basic 25 - 49% of the time - needs direction more than half the time to initiate, plan or complete simple activities  Memory Memory assist level: Recognizes or recalls 50 - 74% of the time/requires cueing 25 - 49% of the time    Medical Problem List and Plan: 1. Left hemiparesis, gait disorder and dysphargia secondary to Right frontal hypertensive ICH on 12/23  -provided stroke education to patient today 2. DVT Prophylaxis/Anticoagulation: Pharmaceutical: Heparin, prophyllactic dose, local care for RUE DVT--no changes 3. Pain Management: Tylenol prn for pain.  4. Mood: LCSW to follow for evaluation and support.  5. Neuropsych: This patient is capable of making decisions on his own behalf. 6. Skin/Wound Care: Routine pressure relief measures 7. Fluids/Electrolytes/Nutrition: Monitor I/O. Offer supplements between meals.   -bmet reviewed in person and normal  today 8. Malignant HTN: Monitor BP every 6 hours as DBP ranging 90- 120.   Continue cozaar  Coreg increased to 25mg  on 12/31  -bp's continue to improve 9. CAD: Monitor for symptoms with increase in activity level. Continue coreg and cozaar. ASA/Plavix discontinued. Lipitor on hold due to bleed.  10. ABLA: Monitor for now.   Hb 11.6 on 12/30 11. Hypokalemia: Likely due to IVF.   Supplemented on 12/30 12.  Muscle stiffness: Resolved  K pad ordered 13. Cough: Improved   CXR on 1/1, no acute abnormalities    -still coughs to clear throat after swallowing  LOS (Days) 8 A FACE TO FACE EVALUATION WAS PERFORMED  Kimberli Winne T 04/18/2015 9:52 AM

## 2015-04-18 NOTE — Progress Notes (Signed)
Speech Language Pathology Weekly Progress and Session Note  Patient Details  Name: Randall Patel MRN: [id number] Date of Birth: 1951/03/15  Beginning of progress report period: April 11, 2015 End of progress report period: April 18, 2015  Today's Date: 04/18/2015 SLP Individual Time: 0315-9458 SLP Individual Time Calculation (min): 25 min  Short Term Goals: Week 1: SLP Short Term Goal 1 (Week 1): Pt will consume dys 1 textures and nectar thick liquids with mod assist verbal cues to monitor and correct anterior loss of boluses over 2 targeted sessions.   SLP Short Term Goal 1 - Progress (Week 1): Met SLP Short Term Goal 2 (Week 1): Pt will consume trials of dys 2 textures with mod assist verbal cues to monitor and correct left sided buccal residue over 3 targeted sessions prior to advancement.   SLP Short Term Goal 2 - Progress (Week 1): Not met SLP Short Term Goal 3 (Week 1): Pt will consume therapeutic trials of regular, unthickened water over 2 targeted sessions with min assist verbal cues for use of safe swallowing precautions and minimal overt s/s of aspiration prior to repeat MBS  SLP Short Term Goal 3 - Progress (Week 1): Met SLP Short Term Goal 4 (Week 1): Pt will attend to the left environment during basic, familiar tasks with max assist verbal cues.   SLP Short Term Goal 4 - Progress (Week 1): Met SLP Short Term Goal 5 (Week 1): Pt will sustain his attention to a basic, familiar task for 3-5 minute intervals with mod assist verbal cues for redirection.   SLP Short Term Goal 5 - Progress (Week 1): Met    New Short Term Goals: Week 2: SLP Short Term Goal 1 (Week 2): Pt will consume current diet with min assist verbal cues to monitor and correct anterior loss of boluses over 2 targeted sessions.   SLP Short Term Goal 2 (Week 2): Pt will consume trials of dys 2 textures with mod assist verbal cues to monitor and correct left sided buccal residue over 3 targeted sessions prior to  advancement.   SLP Short Term Goal 3 (Week 2): Pt will attend to the left environment during basic, familiar tasks with mod assist verbal cues.   SLP Short Term Goal 4 (Week 2): Pt will sustain his attention to a basic, familiar task for 3-5 minute intervals with min assist verbal cues for redirection.    Weekly Progress Updates: Patient has made functional gains and has met 4 of 5 STG's this reporting period. Patient had repeat MBS today without penetration or aspiration noted with thin liquids, therefore, patient upgraded to thin liquids. Recommend patient continue Dys. 1 textures due to poor bolus manipulation and mastication with Dys. 2 textures with continued trials with SLP only. Patient is consuming meal with minimal overt s/s of aspiration and requires supervision verbal cues for use of swallowing compensatory strategies.  Patient demonstrates increased management of secretions with Min A verbal cues needed for patient to self-monitor and correct anterior spillage of saliva. Overall, patient continues to require Max A multimodal cues for attention, problem solving, awareness and attention to left field of environment during functional tasks. Patient would benefit from conitnued skilled SLP intervention to maximize cognitive and swallowing function and overall functional independence prior to discharge.    Intensity: Minumum of 1-2 x/day, 30 to 90 minutes Frequency: 3 to 5 out of 7 days Duration/Length of Stay: 04/25/15 Treatment/Interventions: Cognitive remediation/compensation;Cueing hierarchy;Functional tasks;Dysphagia/aspiration precaution training;Multimodal communication approach;Patient/family education;Internal/external aids;Environmental controls;Speech/Language facilitation;Therapeutic  Activities   Daily Session  Skilled Therapeutic Interventions: Skilled treatment session focused on dysphagia goals. SLP facilitated session by providing supervision verbal cues for use of small, single  sips of thin liquids via a straw. Patient consumed trials with subtle throat clear X 1. Patient declined trials of solid textures. Recommend patient continue thin liquids. Patient left upright in wheelchair with all needs within reach. Continue with current plan of care.       Function:   Eating Eating   Modified Consistency Diet: Yes Eating Assist Level: Supervision or verbal cues;Set up assist for   Eating Set Up Assist For: Opening containers       Cognition Comprehension Comprehension assist level: Understands basic 75 - 89% of the time/ requires cueing 10 - 24% of the time  Expression   Expression assist level: Expresses basic 90% of the time/requires cueing < 10% of the time.  Social Interaction Social Interaction assist level: Interacts appropriately 25 - 49% of time - Needs frequent redirection.  Problem Solving Problem solving assist level: Solves basic 25 - 49% of the time - needs direction more than half the time to initiate, plan or complete simple activities  Memory Memory assist level: Recognizes or recalls 50 - 74% of the time/requires cueing 25 - 49% of the time   Pain No/Denies Pain   Therapy/Group: Individual Therapy  Dovber Ernest 04/18/2015, 12:42 PM

## 2015-04-19 ENCOUNTER — Inpatient Hospital Stay (HOSPITAL_COMMUNITY): Payer: Medicare Other

## 2015-04-19 LAB — GLUCOSE, CAPILLARY: Glucose-Capillary: 84 mg/dL (ref 65–99)

## 2015-04-19 NOTE — Progress Notes (Signed)
[facility]     PROGRESS NOTE    Subjective/Complaints:  Had BM this am, no pain c/os  ROS: Denies CP, SOB, n/v/d, fevers, chills. No anxiety, visual issues.   Objective: Vital Signs: Blood pressure 145/80, pulse 55, temperature 97.5 F (36.4 C), temperature source Oral, resp. rate 16, height 5\' 5"  (1.651 m), weight 75.3 kg (166 lb 0.1 oz), SpO2 100 %. Dg Swallowing Func-speech Pathology  04/18/2015  Objective Swallowing Evaluation:   Patient Details Name: Randall Patel MRN: [id number] Date of Birth: 1951-03-03 Today's Date: 04/18/2015 Time: SLP Start Time (ACUTE ONLY): 0900-SLP Stop Time (ACUTE ONLY): 0930 SLP Time Calculation (min) (ACUTE ONLY): 30 min Past Surgical History: Past Surgical History Procedure Laterality Date . Hernia repair   . Left heart catheterization with coronary angiogram N/A 04/11/2012   Procedure: LEFT HEART CATHETERIZATION WITH CORONARY ANGIOGRAM;  Surgeon: Kathleene Hazel, MD;  Location: [facility] CATH LAB;  Service: Cardiovascular;  Laterality: N/A; . Percutaneous coronary stent intervention (pci-s)  04/11/2012   Procedure: PERCUTANEOUS CORONARY STENT INTERVENTION (PCI-S);  Surgeon: Kathleene Hazel, MD;  Location: [facility] CATH LAB;  Service: Cardiovascular;;  Distal RCA . Percutaneous coronary intervention-balloon only  04/11/2012   Procedure: PERCUTANEOUS CORONARY INTERVENTION-BALLOON ONLY;  Surgeon: Kathleene Hazel, MD;  Location: [facility] CATH LAB;  Service: Cardiovascular;;  RPLA HPI: Randall Patel is an 65 y.o. male with a hx of stroke and CAD s/p stenting presents with acute onset of L sided weakness and found to have Large R ICH on CT. PMH: HTN, CAD, ETOH abuse, tobacco abuse. No Data Recorded Assessment / Plan / Recommendation CHL IP CLINICAL IMPRESSIONS 04/18/2015 Therapy Diagnosis Moderate oral phase dysphagia;Mild pharyngeal phase dysphagia Clinical Impression Pt presents with a moderate oral and mild pharyngeal dysphagia  characterized by impaired oral control with premature spillage into pharynx and residue throughout left lateral sulcus with all liquids and poor bolus manipulation and mastication with soft solids. Patient also demonstrates reduced mobility of the hyolaryngeal complex leading to mild vallecular and pyriform sinus residue that cleared with spontaneous multiple swallows. Patient with one episode of flash penetration on initial sip of thin liquids, however, no penetration or aspiration observed with further trials of thin liquids via cup or straw. Recommend patient continue Dys. 1 textures and upgrade to thin liquids with full supervision for safety.  Impact on safety and function Mild aspiration risk   CHL IP TREATMENT RECOMMENDATION 04/08/2015 Treatment Recommendations Therapy as outlined in treatment plan below   Prognosis 04/18/2015 Prognosis for Safe Diet Advancement Good Barriers to Reach Goals Cognitive deficits Barriers/Prognosis Comment -- CHL IP DIET RECOMMENDATION 04/18/2015 SLP Diet Recommendations Dysphagia 1 (Puree) solids;Thin liquid Liquid Administration via Cup;Straw Medication Administration Crushed with puree Compensations Slow rate;Small sips/bites;Lingual sweep for clearance of pocketing;Multiple dry swallows after each bite/sip Postural Changes Remain semi-upright after after feeds/meals (Comment);Seated upright at 90 degrees   CHL IP OTHER RECOMMENDATIONS 04/18/2015 Recommended Consults -- Oral Care Recommendations Oral care BID Other Recommendations --   CHL IP FOLLOW UP RECOMMENDATIONS 04/09/2015 Follow up Recommendations Inpatient Rehab   CHL IP FREQUENCY AND DURATION 04/08/2015 Speech Therapy Frequency (ACUTE ONLY) min 2x/week Treatment Duration 2 weeks      CHL IP ORAL PHASE 04/18/2015 Oral Phase Impaired Oral - Pudding Teaspoon -- Oral - Pudding Cup -- Oral - Honey Teaspoon -- Oral - Honey Cup NT Oral - Nectar Teaspoon -- Oral - Nectar Cup Left pocketing in lateral sulci;Decreased bolus  cohesion;Left anterior bolus loss;Premature spillage;Weak lingual  manipulation Oral - Nectar Straw -- Oral - Thin Teaspoon Left anterior bolus loss;Left pocketing in lateral sulci;Decreased bolus cohesion;Weak lingual manipulation;Premature spillage Oral - Thin Cup Premature spillage;Decreased bolus cohesion;Weak lingual manipulation;Left anterior bolus loss;Left pocketing in lateral sulci Oral - Thin Straw Weak lingual manipulation;Left anterior bolus loss;Left pocketing in lateral sulci;Decreased bolus cohesion;Premature spillage Oral - Puree Weak lingual manipulation;Left pocketing in lateral sulci;Decreased bolus cohesion;Delayed oral transit Oral - Mech Soft Impaired mastication;Weak lingual manipulation;Decreased bolus cohesion;Delayed oral transit;Left pocketing in lateral sulci Oral - Regular -- Oral - Multi-Consistency -- Oral - Pill -- Oral Phase - Comment --  CHL IP PHARYNGEAL PHASE 04/18/2015 Pharyngeal Phase Impaired Pharyngeal- Pudding Teaspoon -- Pharyngeal -- Pharyngeal- Pudding Cup -- Pharyngeal -- Pharyngeal- Honey Teaspoon -- Pharyngeal -- Pharyngeal- Honey Cup NT Pharyngeal -- Pharyngeal- Nectar Teaspoon -- Pharyngeal -- Pharyngeal- Nectar Cup Delayed swallow initiation-vallecula;Reduced tongue base retraction;Pharyngeal residue - valleculae;Reduced anterior laryngeal mobility;Reduced laryngeal elevation Pharyngeal -- Pharyngeal- Nectar Straw -- Pharyngeal -- Pharyngeal- Thin Teaspoon Delayed swallow initiation-vallecula;Reduced tongue base retraction;Pharyngeal residue - valleculae;Pharyngeal residue - pyriform;Reduced laryngeal elevation;Reduced anterior laryngeal mobility Pharyngeal -- Pharyngeal- Thin Cup Delayed swallow initiation-vallecula;Pharyngeal residue - valleculae;Pharyngeal residue - pyriform;Reduced tongue base retraction;Reduced anterior laryngeal mobility;Reduced laryngeal elevation Pharyngeal -- Pharyngeal- Thin Straw Delayed swallow initiation-pyriform sinuses;Reduced laryngeal  elevation;Reduced tongue base retraction;Reduced anterior laryngeal mobility;Pharyngeal residue - valleculae;Pharyngeal residue - pyriform Pharyngeal -- Pharyngeal- Puree Delayed swallow initiation-vallecula;Reduced laryngeal elevation;Reduced anterior laryngeal mobility;Reduced tongue base retraction Pharyngeal -- Pharyngeal- Mechanical Soft Delayed swallow initiation-vallecula;Reduced tongue base retraction;Reduced laryngeal elevation;Reduced anterior laryngeal mobility Pharyngeal -- Pharyngeal- Regular -- Pharyngeal -- Pharyngeal- Multi-consistency -- Pharyngeal -- Pharyngeal- Pill -- Pharyngeal -- Pharyngeal Comment --  CHL IP CERVICAL ESOPHAGEAL PHASE 04/18/2015 Cervical Esophageal Phase Impaired Pudding Teaspoon -- Pudding Cup -- Honey Teaspoon -- Honey Cup -- Nectar Teaspoon -- Nectar Cup -- Nectar Straw -- Thin Teaspoon -- Thin Cup -- Thin Straw -- Puree -- Mechanical Soft -- Regular -- Multi-consistency -- Pill -- Cervical Esophageal Comment prominent CP  PAYNE, COURTNEY 04/18/2015, 12:39 PM              No results for input(s): WBC, HGB, HCT, PLT in the last 72 hours. No results for input(s): NA, K, CL, GLUCOSE, BUN, CREATININE, CALCIUM in the last 72 hours.  Invalid input(s): CO CBG (last 3)  No results for input(s): GLUCAP in the last 72 hours.  Wt Readings from Last 3 Encounters:  04/16/15 75.3 kg (166 lb 0.1 oz)  04/04/15 71.1 kg (156 lb 12 oz)  02/20/15 74.844 kg (165 lb)    Physical Exam:  BP 145/80 mmHg  Pulse 55  Temp(Src) 97.5 F (36.4 C) (Oral)  Resp 16  Ht 5\' 5"  (1.651 m)  Wt 75.3 kg (166 lb 0.1 oz)  BMI 27.62 kg/m2  SpO2 100% Constitutional: He appears well-developed and well-nourished. Vitals reviewed. HENT: Normocephalic and atraumatic.  Eyes: Conjunctivae and EOM are normal.  Neck: Normal range of motion. Neck supple.  Cardiovascular: Normal rate and regular rhythm.  Respiratory: Effort normal and breath sounds normal. No respiratory distress. He has no  wheezes.  GI: Soft. Bowel sounds are normal. He exhibits no distension. There is no tenderness.  Musculoskeletal: He exhibits no edema. TTP over posterior neck.  Neurological: He is alert and oriented. Coordination abnormal.  Left facial weakness with mild dysarthria. left inattention Able to follow commands.  RUE 4/5 delt, 4/5 Biceps due to pain 5/5 triceps and grip 1/5 delt, pec, bicep, tricep, 0/5 wrist/hand 3-4/5 L KE,  3- to 3/5 L HF and 3 ADF---exam unchanged 5/5 in RLE Sensation intact BUE and BLE  Skin: Skin is warm and dry.  Psych: affect very flat  Assessment/Plan: 1. Functional deficits secondary to Right frontal hypertensive ICH which require 3+ hours per day of interdisciplinary therapy in a comprehensive inpatient rehab setting. Physiatrist is providing close team supervision and 24 hour management of active medical problems listed below. Physiatrist and rehab team continue to assess barriers to discharge/monitor patient progress toward functional and medical goals.  Function:  Bathing Bathing position   Position: Shower  Bathing parts Body parts bathed by patient: Left arm, Chest, Front perineal area, Abdomen, Right upper leg, Left upper leg, Buttocks, Right lower leg, Left lower leg Body parts bathed by helper: Right arm, Back  Bathing assist Assist Level: Touching or steadying assistance(Pt > 75%)      Upper Body Dressing/Undressing Upper body dressing   What is the patient wearing?: Pull over shirt/dress     Pull over shirt/dress - Perfomed by patient: Thread/unthread right sleeve, Put head through opening, Pull shirt over trunk Pull over shirt/dress - Perfomed by helper: Thread/unthread left sleeve        Upper body assist Assist Level: Touching or steadying assistance(Pt > 75%)      Lower Body Dressing/Undressing Lower body dressing   What is the patient wearing?: Underwear, Pants, Socks, Shoes Underwear - Performed by patient: Thread/unthread  right underwear leg, Pull underwear up/down Underwear - Performed by helper: Thread/unthread left underwear leg Pants- Performed by patient: Thread/unthread right pants leg, Thread/unthread left pants leg Pants- Performed by helper: Pull pants up/down, Fasten/unfasten pants Non-skid slipper socks- Performed by patient: Don/doff left sock, Don/doff right sock Non-skid slipper socks- Performed by helper: Don/doff right sock, Don/doff left sock Socks - Performed by patient: Don/doff right sock Socks - Performed by helper: Don/doff left sock Shoes - Performed by patient: Don/doff right shoe Shoes - Performed by helper: Don/doff left shoe, Fasten right, Fasten left          Lower body assist Assist for lower body dressing:  (min A)      Toileting Toileting   Toileting steps completed by patient: Adjust clothing prior to toileting Toileting steps completed by helper: Adjust clothing after toileting Toileting Assistive Devices: Grab bar or rail  Toileting assist Assist level: Touching or steadying assistance (Pt.75%)   Transfers Chair/bed transfer   Chair/bed transfer method: Ambulatory, Stand pivot Chair/bed transfer assist level: Supervision or verbal cues Chair/bed transfer assistive device: Armrests     Locomotion Ambulation     Max distance: 150 Assist level: Touching or steadying assistance (Pt > 75%)   Wheelchair       Assist Level: Dependent (Pt equals 0%)  Cognition Comprehension Comprehension assist level: Understands basic 75 - 89% of the time/ requires cueing 10 - 24% of the time  Expression Expression assist level: Expresses basic 90% of the time/requires cueing < 10% of the time.  Social Interaction Social Interaction assist level: Interacts appropriately 25 - 49% of time - Needs frequent redirection.  Problem Solving Problem solving assist level: Solves basic 25 - 49% of the time - needs direction more than half the time to initiate, plan or complete simple  activities  Memory Memory assist level: Recognizes or recalls 50 - 74% of the time/requires cueing 25 - 49% of the time    Medical Problem List and Plan: 1. Left hemiparesis, severe impairment RUE, moderate LLE gait disorder and dysphargia secondary to  Right frontal hypertensive ICH on 12/23  -provided stroke education to patient today 2. DVT Prophylaxis/Anticoagulation: Pharmaceutical: Heparin, prophyllactic dose, local care for RUE DVT--no changes 3. Pain Management: Tylenol prn for pain.  4. Mood: LCSW to follow for evaluation and support.  5. Neuropsych: This patient is capable of making decisions on his own behalf. 6. Skin/Wound Care: Routine pressure relief measures 7. Fluids/Electrolytes/Nutrition: Monitor I/O. Offer supplements between meals.   -bmet reviewed in person and normal today 8. Malignant HTN: Monitor BP every 6 hours , 145/80 this am  Continue cozaar  Coreg increased to  on 12/31  -bp's continue to improve 9. CAD: Monitor for symptoms with increase in activity level. Continue coreg and cozaar. ASA/Plavix discontinued.  10. ABLA: Monitor for now.   Hb 11.6 on 12/30 11. Hypokalemia: Likely due to IVF. Resolved 12.  Hx hyperlipidemia, check lipid panel, currently off    LOS (Days) 9 A FACE TO FACE EVALUATION WAS PERFORMED  KIRSTEINS,ANDREW E 04/19/2015 9:22 AM

## 2015-04-19 NOTE — Progress Notes (Signed)
Occupational Therapy Session Note  Patient Details  Name: Randall Patel MRN: [id number] Date of Birth: [birth year]  Today's Date: 04/19/2015 OT Individual Time: 0830-0900 OT Individual Time Calculation (min): 30 min    Short Term Goals: Week 1:  OT Short Term Goal 1 (Week 1): Pt will perform UB dressing with min A in order to increase I with self care.  OT Short Term Goal 2 (Week 1): Pt will perform toilet transfer with min A in order to increase I with functional transfer.  OT Short Term Goal 3 (Week 1): Pt will scan to the L and locate items needed for self care tasks with min verbal cues.  OT Short Term Goal 4 (Week 1): Pt will perform LB dressing with mod A in order to decrease assist with self care.   Skilled Therapeutic Interventions/Progress Updates:    OT session focused on functional mobility, ADL retraining, and standing balance. Pt completed toileting with steadying assist for balance and min A for ambulating w/c<>bathroom. Pt completed dressing sit<>stand with mod cues for hemi dressing technique and min A for balance. Pt demonstrated improved safety as he checked for locking of w/c brakes before each stand. Completed oral care in standing with min A and cues to decreased reliance on sink for posture.   Therapy Documentation Precautions:  Precautions Precautions: Fall Precaution Comments: L hemiparesis, L inattention Restrictions Weight Bearing Restrictions: No General:   Vital Signs:  Pain: No report of pain  See Function Navigator for Current Functional Status.   Therapy/Group: Individual Therapy  Dwon Sky, Vara Guardian 04/19/2015, 10:09 AM

## 2015-04-20 ENCOUNTER — Inpatient Hospital Stay (HOSPITAL_COMMUNITY): Payer: Medicare Other | Admitting: Occupational Therapy

## 2015-04-20 NOTE — Progress Notes (Signed)
[place] PHYSICAL MEDICINE & REHABILITATION     PROGRESS NOTE    Subjective/Complaints:  Pt prefers condome cath, discussed use of urinal  ROS: Denies CP, SOB, n/v/d, fevers, chills. No anxiety, visual issues.   Objective: Vital Signs: Blood pressure 129/82, pulse 62, temperature 98 F (36.7 C), temperature source Oral, resp. rate 18, height 5\' 5"  (1.651 m), weight 75.3 kg (166 lb 0.1 oz), SpO2 98 %. Dg Swallowing Func-speech Pathology  04/18/2015  Objective Swallowing Evaluation:   Patient Details Name: Randall Patel MRN: [id number] Date of Birth: [birth year] Today's Date: 04/18/2015 Time: SLP Start Time (ACUTE ONLY): 0900-SLP Stop Time (ACUTE ONLY): 0930 SLP Time Calculation (min) (ACUTE ONLY): 30 min Past Surgical History: Past Surgical History Procedure Laterality Date . Hernia repair   . Left heart catheterization with coronary angiogram N/A 04/11/2012   Procedure: LEFT HEART CATHETERIZATION WITH CORONARY ANGIOGRAM;  Surgeon: Kathleene Hazel, MD;  Location: [facility] CATH LAB;  Service: Cardiovascular;  Laterality: N/A; . Percutaneous coronary stent intervention (pci-s)  04/11/2012   Procedure: PERCUTANEOUS CORONARY STENT INTERVENTION (PCI-S);  Surgeon: Kathleene Hazel, MD;  Location: [facility] CATH LAB;  Service: Cardiovascular;;  Distal RCA . Percutaneous coronary intervention-balloon only  04/11/2012   Procedure: PERCUTANEOUS CORONARY INTERVENTION-BALLOON ONLY;  Surgeon: Kathleene Hazel, MD;  Location: [facility] CATH LAB;  Service: Cardiovascular;;  RPLA HPI: Randall Patel is an 65 y.o. male with a hx of stroke and CAD s/p stenting presents with acute onset of L sided weakness and found to have Large R ICH on CT. PMH: HTN, CAD, ETOH abuse, tobacco abuse. No Data Recorded Assessment / Plan / Recommendation CHL IP CLINICAL IMPRESSIONS 04/18/2015 Therapy Diagnosis Moderate oral phase dysphagia;Mild pharyngeal phase dysphagia Clinical Impression Pt presents with a moderate oral and mild pharyngeal  dysphagia characterized by impaired oral control with premature spillage into pharynx and residue throughout left lateral sulcus with all liquids and poor bolus manipulation and mastication with soft solids. Patient also demonstrates reduced mobility of the hyolaryngeal complex leading to mild vallecular and pyriform sinus residue that cleared with spontaneous multiple swallows. Patient with one episode of flash penetration on initial sip of thin liquids, however, no penetration or aspiration observed with further trials of thin liquids via cup or straw. Recommend patient continue Dys. 1 textures and upgrade to thin liquids with full supervision for safety.  Impact on safety and function Mild aspiration risk   CHL IP TREATMENT RECOMMENDATION 04/08/2015 Treatment Recommendations Therapy as outlined in treatment plan below   Prognosis 04/18/2015 Prognosis for Safe Diet Advancement Good Barriers to Reach Goals Cognitive deficits Barriers/Prognosis Comment -- CHL IP DIET RECOMMENDATION 04/18/2015 SLP Diet Recommendations Dysphagia 1 (Puree) solids;Thin liquid Liquid Administration via Cup;Straw Medication Administration Crushed with puree Compensations Slow rate;Small sips/bites;Lingual sweep for clearance of pocketing;Multiple dry swallows after each bite/sip Postural Changes Remain semi-upright after after feeds/meals (Comment);Seated upright at 90 degrees   CHL IP OTHER RECOMMENDATIONS 04/18/2015 Recommended Consults -- Oral Care Recommendations Oral care BID Other Recommendations --   CHL IP FOLLOW UP RECOMMENDATIONS 04/09/2015 Follow up Recommendations Inpatient Rehab   CHL IP FREQUENCY AND DURATION 04/08/2015 Speech Therapy Frequency (ACUTE ONLY) min 2x/week Treatment Duration 2 weeks      CHL IP ORAL PHASE 04/18/2015 Oral Phase Impaired Oral - Pudding Teaspoon -- Oral - Pudding Cup -- Oral - Honey Teaspoon -- Oral - Honey Cup NT Oral - Nectar Teaspoon -- Oral - Nectar Cup Left pocketing in lateral sulci;Decreased bolus  cohesion;Left anterior bolus loss;Premature spillage;Weak  lingual manipulation Oral - Nectar Straw -- Oral - Thin Teaspoon Left anterior bolus loss;Left pocketing in lateral sulci;Decreased bolus cohesion;Weak lingual manipulation;Premature spillage Oral - Thin Cup Premature spillage;Decreased bolus cohesion;Weak lingual manipulation;Left anterior bolus loss;Left pocketing in lateral sulci Oral - Thin Straw Weak lingual manipulation;Left anterior bolus loss;Left pocketing in lateral sulci;Decreased bolus cohesion;Premature spillage Oral - Puree Weak lingual manipulation;Left pocketing in lateral sulci;Decreased bolus cohesion;Delayed oral transit Oral - Mech Soft Impaired mastication;Weak lingual manipulation;Decreased bolus cohesion;Delayed oral transit;Left pocketing in lateral sulci Oral - Regular -- Oral - Multi-Consistency -- Oral - Pill -- Oral Phase - Comment --  CHL IP PHARYNGEAL PHASE 04/18/2015 Pharyngeal Phase Impaired Pharyngeal- Pudding Teaspoon -- Pharyngeal -- Pharyngeal- Pudding Cup -- Pharyngeal -- Pharyngeal- Honey Teaspoon -- Pharyngeal -- Pharyngeal- Honey Cup NT Pharyngeal -- Pharyngeal- Nectar Teaspoon -- Pharyngeal -- Pharyngeal- Nectar Cup Delayed swallow initiation-vallecula;Reduced tongue base retraction;Pharyngeal residue - valleculae;Reduced anterior laryngeal mobility;Reduced laryngeal elevation Pharyngeal -- Pharyngeal- Nectar Straw -- Pharyngeal -- Pharyngeal- Thin Teaspoon Delayed swallow initiation-vallecula;Reduced tongue base retraction;Pharyngeal residue - valleculae;Pharyngeal residue - pyriform;Reduced laryngeal elevation;Reduced anterior laryngeal mobility Pharyngeal -- Pharyngeal- Thin Cup Delayed swallow initiation-vallecula;Pharyngeal residue - valleculae;Pharyngeal residue - pyriform;Reduced tongue base retraction;Reduced anterior laryngeal mobility;Reduced laryngeal elevation Pharyngeal -- Pharyngeal- Thin Straw Delayed swallow initiation-pyriform sinuses;Reduced laryngeal  elevation;Reduced tongue base retraction;Reduced anterior laryngeal mobility;Pharyngeal residue - valleculae;Pharyngeal residue - pyriform Pharyngeal -- Pharyngeal- Puree Delayed swallow initiation-vallecula;Reduced laryngeal elevation;Reduced anterior laryngeal mobility;Reduced tongue base retraction Pharyngeal -- Pharyngeal- Mechanical Soft Delayed swallow initiation-vallecula;Reduced tongue base retraction;Reduced laryngeal elevation;Reduced anterior laryngeal mobility Pharyngeal -- Pharyngeal- Regular -- Pharyngeal -- Pharyngeal- Multi-consistency -- Pharyngeal -- Pharyngeal- Pill -- Pharyngeal -- Pharyngeal Comment --  CHL IP CERVICAL ESOPHAGEAL PHASE 04/18/2015 Cervical Esophageal Phase Impaired Pudding Teaspoon -- Pudding Cup -- Honey Teaspoon -- Honey Cup -- Nectar Teaspoon -- Nectar Cup -- Nectar Straw -- Thin Teaspoon -- Thin Cup -- Thin Straw -- Puree -- Mechanical Soft -- Regular -- Multi-consistency -- Pill -- Cervical Esophageal Comment prominent CP  PAYNE, COURTNEY 04/18/2015, 12:39 PM              No results for input(s): WBC, HGB, HCT, PLT in the last 72 hours. No results for input(s): NA, K, CL, GLUCOSE, BUN, CREATININE, CALCIUM in the last 72 hours.  Invalid input(s): CO CBG (last 3)   Recent Labs  04/19/15 1130  GLUCAP 84    Wt Readings from Last 3 Encounters:  04/16/15 75.3 kg (166 lb 0.1 oz)  04/04/15 71.1 kg (156 lb 12 oz)  02/20/15 74.844 kg (165 lb)    Physical Exam:  BP 129/82 mmHg  Pulse 62  Temp(Src) 98 F (36.7 C) (Oral)  Resp 18  Ht  (1.651 m)  Wt 75.3 kg (166 lb 0.1 oz)  BMI 27.62 kg/m2  SpO2 98% Constitutional: He appears well-developed and well-nourished. Vitals reviewed. HENT: Normocephalic and atraumatic.  Eyes: Conjunctivae and EOM are normal.  Neck: Normal range of motion. Neck supple.  Cardiovascular: Normal rate and regular rhythm.  Respiratory: Effort normal and breath sounds normal. No respiratory distress. He has no wheezes.  GI:  Soft. Bowel sounds are normal. He exhibits no distension. There is no tenderness.  Musculoskeletal: He exhibits no edema. TTP over posterior neck.  Neurological: He is alert and oriented. Coordination abnormal.  Left facial weakness with mild dysarthria. left inattention Able to follow commands.  RUE 4/5 delt, 4/5 Biceps due to pain 5/5 triceps and grip 1/5 delt, pec, bicep, tricep, 0/5 wrist/hand 3-4/5  L KE, 3- to 3/5 L HF and 3 ADF---exam unchanged 5/5 in RLE Sensation intact BUE and BLE  Skin: Skin is warm and dry.  Psych: affect very flat  Assessment/Plan: 1. Functional deficits secondary to Right frontal hypertensive ICH which require 3+ hours per day of interdisciplinary therapy in a comprehensive inpatient rehab setting. Physiatrist is providing close team supervision and 24 hour management of active medical problems listed below. Physiatrist and rehab team continue to assess barriers to discharge/monitor patient progress toward functional and medical goals.  Function:  Bathing Bathing position   Position: Shower  Bathing parts Body parts bathed by patient: Left arm, Chest, Front perineal area, Abdomen, Right upper leg, Left upper leg, Buttocks, Right lower leg, Left lower leg Body parts bathed by helper: Right arm, Back  Bathing assist Assist Level: Touching or steadying assistance(Pt > 75%)      Upper Body Dressing/Undressing Upper body dressing   What is the patient wearing?: Pull over shirt/dress     Pull over shirt/dress - Perfomed by patient: Thread/unthread right sleeve, Put head through opening, Pull shirt over trunk Pull over shirt/dress - Perfomed by helper: Thread/unthread left sleeve        Upper body assist Assist Level: Touching or steadying assistance(Pt > 75%)      Lower Body Dressing/Undressing Lower body dressing   What is the patient wearing?: Underwear, Pants, Socks, Shoes Underwear - Performed by patient: Thread/unthread right underwear leg,  Pull underwear up/down Underwear - Performed by helper: Thread/unthread left underwear leg Pants- Performed by patient: Thread/unthread right pants leg, Thread/unthread left pants leg Pants- Performed by helper: Pull pants up/down, Fasten/unfasten pants Non-skid slipper socks- Performed by patient: Don/doff left sock, Don/doff right sock Non-skid slipper socks- Performed by helper: Don/doff right sock, Don/doff left sock Socks - Performed by patient: Don/doff right sock Socks - Performed by helper: Don/doff left sock Shoes - Performed by patient: Don/doff right shoe Shoes - Performed by helper: Don/doff left shoe, Fasten right, Fasten left          Lower body assist Assist for lower body dressing:  (min A)      Toileting Toileting   Toileting steps completed by patient: Adjust clothing prior to toileting, Performs perineal hygiene, Adjust clothing after toileting Toileting steps completed by helper: Adjust clothing after toileting Toileting Assistive Devices: Grab bar or rail  Toileting assist Assist level: Touching or steadying assistance (Pt.75%)   Transfers Chair/bed transfer   Chair/bed transfer method: Ambulatory, Stand pivot Chair/bed transfer assist level: Supervision or verbal cues Chair/bed transfer assistive device: Armrests     Locomotion Ambulation     Max distance: 150 Assist level: Touching or steadying assistance (Pt > 75%)   Wheelchair       Assist Level: Dependent (Pt equals 0%)  Cognition Comprehension Comprehension assist level: Understands basic 75 - 89% of the time/ requires cueing 10 - 24% of the time  Expression Expression assist level: Expresses basic 90% of the time/requires cueing < 10% of the time.  Social Interaction Social Interaction assist level: Interacts appropriately 25 - 49% of time - Needs frequent redirection.  Problem Solving Problem solving assist level: Solves basic 25 - 49% of the time - needs direction more than half the time to  initiate, plan or complete simple activities  Memory Memory assist level: Recognizes or recalls 50 - 74% of the time/requires cueing 25 - 49% of the time    Medical Problem List and Plan: 1. Left hemiparesis, severe impairment  RUE, moderate LLE gait disorder and dysphargia secondary to Right frontal hypertensive ICH on 12/23  -cont bowel and bladder- enc pt to try urinal rather than condom cath 2. DVT Prophylaxis/Anticoagulation: Pharmaceutical: Heparin, prophyllactic dose, local care for RUE DVT--no changes 3. Pain Management: Tylenol prn for pain.  4. Mood: LCSW to follow for evaluation and support.  5. Neuropsych: This patient is capable of making decisions on his own behalf. 6. Skin/Wound Care: Routine pressure relief measures 7. Fluids/Electrolytes/Nutrition: Monitor I/O. Offer supplements between meals.   -bmet reviewed in person and normal today 8. Malignant HTN: Monitor BP every 6 hours , 129/82 this am  Continue cozaar  Coreg increased to 25mg  on 12/31   9. CAD: Monitor for symptoms with increase in activity level. Continue coreg and cozaar. ASA/Plavix discontinued.  10. ABLA: Monitor for now.   Hb 11.6 on 12/30  11.  Hx hyperlipidemia, check lipid panel, currently off  12.  Dyphagia D1, thin- no sign of aspiration  LOS (Days) 10 A FACE TO FACE EVALUATION WAS PERFORMED  KIRSTEINS,ANDREW E 04/20/2015 9:12 AM

## 2015-04-21 ENCOUNTER — Inpatient Hospital Stay (HOSPITAL_COMMUNITY): Payer: Medicare Other | Admitting: Occupational Therapy

## 2015-04-21 ENCOUNTER — Inpatient Hospital Stay (HOSPITAL_COMMUNITY): Payer: Medicare Other | Admitting: Speech Pathology

## 2015-04-21 ENCOUNTER — Inpatient Hospital Stay (HOSPITAL_COMMUNITY): Payer: Medicare Other

## 2015-04-21 LAB — GLUCOSE, CAPILLARY: GLUCOSE-CAPILLARY: 84 mg/dL (ref 65–99)

## 2015-04-21 NOTE — Progress Notes (Signed)
Speech Language Pathology Daily Session Note  Patient Details  Name: Randall Patel MRN: [id number] Date of Birth: [birth year]  Today's Date: 04/21/2015 SLP Individual Time: 1400-1500 SLP Individual Time Calculation (min): 60 min  Short Term Goals: Week 2: SLP Short Term Goal 1 (Week 2): Pt will consume current diet with min assist verbal cues to monitor and correct anterior loss of boluses over 2 targeted sessions.   SLP Short Term Goal 2 (Week 2): Pt will consume trials of dys 2 textures with mod assist verbal cues to monitor and correct left sided buccal residue over 3 targeted sessions prior to advancement.   SLP Short Term Goal 3 (Week 2): Pt will attend to the left environment during basic, familiar tasks with mod assist verbal cues.   SLP Short Term Goal 4 (Week 2): Pt will sustain his attention to a basic, familiar task for 3-5 minute intervals with min assist verbal cues for redirection.    Skilled Therapeutic Interventions: Skilled treatment session focused on dysphagia and cognitive goals. SLP facilitated session by providing skilled observation with trials of Dys. 2 textures. Patient demonstrated efficient mastication with minimal oral residue that cleared with a liquid wash that the patient spontaneously utilized. Recommend trial tray prior to upgrade.  SLP also facilitated session by providing Mod A verbal cues for problem solving and attention during a familiar task. Patient independently request to use the bathroom and required Mod A verbal cues for safety with task. Patient left upright in wheelchair with quick release belt in place and all needs within reach. Continue with current plan of care.    Function:  Eating Eating   Modified Consistency Diet: Yes Eating Assist Level: Set up assist for;Supervision or verbal cues   Eating Set Up Assist For: Opening containers       Cognition Comprehension Comprehension assist level: Understands basic 75 - 89% of the time/ requires  cueing 10 - 24% of the time  Expression   Expression assist level: Expresses basic 90% of the time/requires cueing < 10% of the time.  Social Interaction Social Interaction assist level: Interacts appropriately 50 - 74% of the time - May be physically or verbally inappropriate.  Problem Solving Problem solving assist level: Solves basic 25 - 49% of the time - needs direction more than half the time to initiate, plan or complete simple activities  Memory Memory assist level: Recognizes or recalls 50 - 74% of the time/requires cueing 25 - 49% of the time    Pain Pain Assessment Pain Assessment: No/denies pain  Therapy/Group: Individual Therapy  Dequandre Cordova 04/21/2015, 4:55 PM

## 2015-04-21 NOTE — Progress Notes (Signed)
[place] PHYSICAL MEDICINE & REHABILITATION     PROGRESS NOTE    Subjective/Complaints:  No complaints. Left arm still slow. Working with therapies. Sleep improved.  ROS: Denies CP, SOB, n/v/d, fevers, chills. No anxiety, visual issues.   Objective: Vital Signs: Blood pressure 125/92, pulse 61, temperature 97.9 F (36.6 C), temperature source Oral, resp. rate 17, height 5\' 5"  (1.651 m), weight 75.3 kg (166 lb 0.1 oz), SpO2 98 %. Dg Swallowing Func-speech Pathology  04/18/2015  Objective Swallowing Evaluation:   Patient Details Name: Randall Patel MRN: [id number] Date of Birth: January 11, 1951 Today's Date: 04/18/2015 Time: SLP Start Time (ACUTE ONLY): 0900-SLP Stop Time (ACUTE ONLY): 0930 SLP Time Calculation (min) (ACUTE ONLY): 30 min Past Surgical History: Past Surgical History Procedure Laterality Date . Hernia repair   . Left heart catheterization with coronary angiogram N/A 04/11/2012   Procedure: LEFT HEART CATHETERIZATION WITH CORONARY ANGIOGRAM;  Surgeon: Kathleene Hazel, MD;  Location: [facility] CATH LAB;  Service: Cardiovascular;  Laterality: N/A; . Percutaneous coronary stent intervention (pci-s)  04/11/2012   Procedure: PERCUTANEOUS CORONARY STENT INTERVENTION (PCI-S);  Surgeon: Kathleene Hazel, MD;  Location: [facility] CATH LAB;  Service: Cardiovascular;;  Distal RCA . Percutaneous coronary intervention-balloon only  04/11/2012   Procedure: PERCUTANEOUS CORONARY INTERVENTION-BALLOON ONLY;  Surgeon: Kathleene Hazel, MD;  Location: [facility] CATH LAB;  Service: Cardiovascular;;  RPLA HPI: Randall Patel is an 65 y.o. male with a hx of stroke and CAD s/p stenting presents with acute onset of L sided weakness and found to have Large R ICH on CT. PMH: HTN, CAD, ETOH abuse, tobacco abuse. No Data Recorded Assessment / Plan / Recommendation CHL IP CLINICAL IMPRESSIONS 04/18/2015 Therapy Diagnosis Moderate oral phase dysphagia;Mild pharyngeal phase dysphagia Clinical Impression Pt presents with a moderate  oral and mild pharyngeal dysphagia characterized by impaired oral control with premature spillage into pharynx and residue throughout left lateral sulcus with all liquids and poor bolus manipulation and mastication with soft solids. Patient also demonstrates reduced mobility of the hyolaryngeal complex leading to mild vallecular and pyriform sinus residue that cleared with spontaneous multiple swallows. Patient with one episode of flash penetration on initial sip of thin liquids, however, no penetration or aspiration observed with further trials of thin liquids via cup or straw. Recommend patient continue Dys. 1 textures and upgrade to thin liquids with full supervision for safety.  Impact on safety and function Mild aspiration risk   CHL IP TREATMENT RECOMMENDATION 04/08/2015 Treatment Recommendations Therapy as outlined in treatment plan below   Prognosis 04/18/2015 Prognosis for Safe Diet Advancement Good Barriers to Reach Goals Cognitive deficits Barriers/Prognosis Comment -- CHL IP DIET RECOMMENDATION 04/18/2015 SLP Diet Recommendations Dysphagia 1 (Puree) solids;Thin liquid Liquid Administration via Cup;Straw Medication Administration Crushed with puree Compensations Slow rate;Small sips/bites;Lingual sweep for clearance of pocketing;Multiple dry swallows after each bite/sip Postural Changes Remain semi-upright after after feeds/meals (Comment);Seated upright at 90 degrees   CHL IP OTHER RECOMMENDATIONS 04/18/2015 Recommended Consults -- Oral Care Recommendations Oral care BID Other Recommendations --   CHL IP FOLLOW UP RECOMMENDATIONS 04/09/2015 Follow up Recommendations Inpatient Rehab   CHL IP FREQUENCY AND DURATION 04/08/2015 Speech Therapy Frequency (ACUTE ONLY) min 2x/week Treatment Duration 2 weeks      CHL IP ORAL PHASE 04/18/2015 Oral Phase Impaired Oral - Pudding Teaspoon -- Oral - Pudding Cup -- Oral - Honey Teaspoon -- Oral - Honey Cup NT Oral - Nectar Teaspoon -- Oral - Nectar Cup Left pocketing in  lateral sulci;Decreased bolus cohesion;Left anterior  bolus loss;Premature spillage;Weak lingual manipulation Oral - Nectar Straw -- Oral - Thin Teaspoon Left anterior bolus loss;Left pocketing in lateral sulci;Decreased bolus cohesion;Weak lingual manipulation;Premature spillage Oral - Thin Cup Premature spillage;Decreased bolus cohesion;Weak lingual manipulation;Left anterior bolus loss;Left pocketing in lateral sulci Oral - Thin Straw Weak lingual manipulation;Left anterior bolus loss;Left pocketing in lateral sulci;Decreased bolus cohesion;Premature spillage Oral - Puree Weak lingual manipulation;Left pocketing in lateral sulci;Decreased bolus cohesion;Delayed oral transit Oral - Mech Soft Impaired mastication;Weak lingual manipulation;Decreased bolus cohesion;Delayed oral transit;Left pocketing in lateral sulci Oral - Regular -- Oral - Multi-Consistency -- Oral - Pill -- Oral Phase - Comment --  CHL IP PHARYNGEAL PHASE 04/18/2015 Pharyngeal Phase Impaired Pharyngeal- Pudding Teaspoon -- Pharyngeal -- Pharyngeal- Pudding Cup -- Pharyngeal -- Pharyngeal- Honey Teaspoon -- Pharyngeal -- Pharyngeal- Honey Cup NT Pharyngeal -- Pharyngeal- Nectar Teaspoon -- Pharyngeal -- Pharyngeal- Nectar Cup Delayed swallow initiation-vallecula;Reduced tongue base retraction;Pharyngeal residue - valleculae;Reduced anterior laryngeal mobility;Reduced laryngeal elevation Pharyngeal -- Pharyngeal- Nectar Straw -- Pharyngeal -- Pharyngeal- Thin Teaspoon Delayed swallow initiation-vallecula;Reduced tongue base retraction;Pharyngeal residue - valleculae;Pharyngeal residue - pyriform;Reduced laryngeal elevation;Reduced anterior laryngeal mobility Pharyngeal -- Pharyngeal- Thin Cup Delayed swallow initiation-vallecula;Pharyngeal residue - valleculae;Pharyngeal residue - pyriform;Reduced tongue base retraction;Reduced anterior laryngeal mobility;Reduced laryngeal elevation Pharyngeal -- Pharyngeal- Thin Straw Delayed swallow  initiation-pyriform sinuses;Reduced laryngeal elevation;Reduced tongue base retraction;Reduced anterior laryngeal mobility;Pharyngeal residue - valleculae;Pharyngeal residue - pyriform Pharyngeal -- Pharyngeal- Puree Delayed swallow initiation-vallecula;Reduced laryngeal elevation;Reduced anterior laryngeal mobility;Reduced tongue base retraction Pharyngeal -- Pharyngeal- Mechanical Soft Delayed swallow initiation-vallecula;Reduced tongue base retraction;Reduced laryngeal elevation;Reduced anterior laryngeal mobility Pharyngeal -- Pharyngeal- Regular -- Pharyngeal -- Pharyngeal- Multi-consistency -- Pharyngeal -- Pharyngeal- Pill -- Pharyngeal -- Pharyngeal Comment --  CHL IP CERVICAL ESOPHAGEAL PHASE 04/18/2015 Cervical Esophageal Phase Impaired Pudding Teaspoon -- Pudding Cup -- Honey Teaspoon -- Honey Cup -- Nectar Teaspoon -- Nectar Cup -- Nectar Straw -- Thin Teaspoon -- Thin Cup -- Thin Straw -- Puree -- Mechanical Soft -- Regular -- Multi-consistency -- Pill -- Cervical Esophageal Comment prominent CP  PAYNE, COURTNEY 04/18/2015, 12:39 PM              No results for input(s): WBC, HGB, HCT, PLT in the last 72 hours. No results for input(s): NA, K, CL, GLUCOSE, BUN, CREATININE, CALCIUM in the last 72 hours.  Invalid input(s): CO CBG (last 3)   Recent Labs  04/19/15 1130 04/21/15 0653  GLUCAP 84 84    Wt Readings from Last 3 Encounters:  04/16/15 75.3 kg (166 lb 0.1 oz)  04/04/15 71.1 kg (156 lb 12 oz)  02/20/15 74.844 kg (165 lb)    Physical Exam:  BP 125/92 mmHg  Pulse 61  Temp(Src) 97.9 F (36.6 C) (Oral)  Resp 17  Ht 5\' 5"  (1.651 m)  Wt 75.3 kg (166 lb 0.1 oz)  BMI 27.62 kg/m2  SpO2 98% Constitutional: He appears well-developed and well-nourished. Vitals reviewed. HENT: Normocephalic and atraumatic.  Eyes: Conjunctivae and EOM are normal.  Neck: Normal range of motion. Neck supple.  Cardiovascular: Normal rate and regular rhythm.  Respiratory: Effort normal and breath  sounds normal. No respiratory distress. He has no wheezes.  GI: Soft. Bowel sounds are normal. He exhibits no distension. There is no tenderness.  Musculoskeletal: He exhibits no edema. TTP over posterior neck.  Neurological: He is alert and oriented. Coordination abnormal.  Left facial weakness with mild dysarthria. left inattention Able to follow commands.  RUE 4/5 delt, 4/5 Biceps due to pain 5/5 triceps and grip 1/5 delt,  pec, bicep, tricep, 0/5 wrist/hand 3-4/5 L KE, 3- to 3/5 L HF and 3 ADF---exam unchanged 5/5 in RLE Sensation intact BUE and BLE  Skin: Skin is warm and dry.  Psych: affect very flat  Assessment/Plan: 1. Functional deficits secondary to Right frontal hypertensive ICH which require 3+ hours per day of interdisciplinary therapy in a comprehensive inpatient rehab setting. Physiatrist is providing close team supervision and 24 hour management of active medical problems listed below. Physiatrist and rehab team continue to assess barriers to discharge/monitor patient progress toward functional and medical goals.  Function:  Bathing Bathing position   Position: Shower  Bathing parts Body parts bathed by patient: Left arm, Chest, Front perineal area, Abdomen, Right upper leg, Left upper leg, Buttocks, Right lower leg, Left lower leg Body parts bathed by helper: Right arm, Back  Bathing assist Assist Level: Touching or steadying assistance(Pt > 75%)      Upper Body Dressing/Undressing Upper body dressing   What is the patient wearing?: Pull over shirt/dress     Pull over shirt/dress - Perfomed by patient: Thread/unthread right sleeve, Put head through opening, Pull shirt over trunk Pull over shirt/dress - Perfomed by helper: Thread/unthread left sleeve        Upper body assist Assist Level: Touching or steadying assistance(Pt > 75%)      Lower Body Dressing/Undressing Lower body dressing   What is the patient wearing?: Underwear, Pants, Socks,  Shoes Underwear - Performed by patient: Thread/unthread right underwear leg, Pull underwear up/down Underwear - Performed by helper: Thread/unthread left underwear leg Pants- Performed by patient: Thread/unthread right pants leg, Thread/unthread left pants leg Pants- Performed by helper: Pull pants up/down, Fasten/unfasten pants Non-skid slipper socks- Performed by patient: Don/doff left sock, Don/doff right sock Non-skid slipper socks- Performed by helper: Don/doff right sock, Don/doff left sock Socks - Performed by patient: Don/doff right sock Socks - Performed by helper: Don/doff left sock Shoes - Performed by patient: Don/doff right shoe Shoes - Performed by helper: Don/doff left shoe, Fasten right, Fasten left          Lower body assist Assist for lower body dressing:  (min A)      Toileting Toileting   Toileting steps completed by patient: Adjust clothing prior to toileting, Performs perineal hygiene, Adjust clothing after toileting Toileting steps completed by helper: Adjust clothing after toileting Toileting Assistive Devices: Grab bar or rail  Toileting assist Assist level: Touching or steadying assistance (Pt.75%)   Transfers Chair/bed transfer   Chair/bed transfer method: Ambulatory, Stand pivot Chair/bed transfer assist level: Supervision or verbal cues Chair/bed transfer assistive device: Armrests     Locomotion Ambulation     Max distance: 150 Assist level: Touching or steadying assistance (Pt > 75%)   Wheelchair       Assist Level: Dependent (Pt equals 0%)  Cognition Comprehension Comprehension assist level: Understands basic 75 - 89% of the time/ requires cueing 10 - 24% of the time  Expression Expression assist level: Expresses basic 90% of the time/requires cueing < 10% of the time.  Social Interaction Social Interaction assist level: Interacts appropriately 25 - 49% of time - Needs frequent redirection.  Problem Solving Problem solving assist level:  Solves basic 25 - 49% of the time - needs direction more than half the time to initiate, plan or complete simple activities  Memory Memory assist level: Recognizes or recalls 50 - 74% of the time/requires cueing 25 - 49% of the time    Medical Problem List and  Plan: 1. Left hemiparesis, severe impairment RUE, moderate LLE gait disorder and dysphargia secondary to Right frontal hypertensive ICH on 12/23  -cont bowel and bladder- enc pt to try urinal rather than condom cath 2. DVT Prophylaxis/Anticoagulation: Pharmaceutical: Heparin, prophyllactic dose, local care for RUE DVT--no changes 3. Pain Management: Tylenol prn for pain.  4. Mood: LCSW to follow for evaluation and support.  5. Neuropsych: This patient is capable of making decisions on his own behalf. 6. Skin/Wound Care: Routine pressure relief measures 7. Fluids/Electrolytes/Nutrition: Monitor I/O. Offer supplements between meals.   -  8. Malignant HTN: Monitor BP every 6 hours , 125/92 this am  Continue cozaar  Coreg increased to  on 12/31   9. CAD: Monitor for symptoms with increase in activity level. Continue coreg and cozaar. ASA/Plavix discontinued.  10. ABLA: Monitor for now.   Hb 11.6 on 12/30  11.  Hx hyperlipidemia, check lipid panel, currently off  12.  Dyphagia D1, thin- no sign of aspiration at this point  LOS (Days) 11 A FACE TO FACE EVALUATION WAS PERFORMED  Jobina Maita T 04/21/2015 10:21 AM

## 2015-04-21 NOTE — Progress Notes (Signed)
Occupational Therapy Weekly Progress Note  Patient Details  Name: Randall Patel MRN: [id number] Date of Birth: [birth year]  Beginning of progress report period: April 11, 2015 End of progress report period: April 21, 2015  Today's Date: 04/21/2015 OT Individual Time: 7062-3762 OT Individual Time Calculation (min): 85 min    Patient has met 4 of 4 short term goals. Pt making steady progress towards occupational therapy goals. Pt continues to show poor insight, decreased cognition, and limited awareness to L side. Pt requires overall min A with self care tasks at this time and min verbal cues for vision strategies. Pt will continue to benefit from OT intervention.   Patient continues to demonstrate the following deficits: decreased I in self care, decreased I in balance, decreased safety awareness, decreased sensation, decreased activity tolerance, inattention to the L, decreased cognition, decreased functional transfers/mobility, and decreased use of L UE/LE for functional tasks  and therefore will continue to benefit from skilled OT intervention to enhance overall performance with BADL.    Patient progressing toward long term goals..  Continue plan of care.  OT Short Term Goals Week 1:  OT Short Term Goal 1 (Week 1): Pt will perform UB dressing with min A in order to increase I with self care.  OT Short Term Goal 1 - Progress (Week 1): Met OT Short Term Goal 2 (Week 1): Pt will perform toilet transfer with min A in order to increase I with functional transfer.  OT Short Term Goal 2 - Progress (Week 1): Met OT Short Term Goal 3 (Week 1): Pt will scan to the L and locate items needed for self care tasks with min verbal cues.  OT Short Term Goal 3 - Progress (Week 1): Met OT Short Term Goal 4 (Week 1): Pt will perform LB dressing with mod A in order to decrease assist with self care.  OT Short Term Goal 4 - Progress (Week 1): Met Week 2:  OT Short Term Goal 1 (Week 2): STGs=LTGS secondary  to upcoming discharge.  Skilled Therapeutic Interventions/Progress Updates:  Upon entering the room, pt supine in bed with RN present in room. Pt with no c/o pain this session. Pt ambulating without use of AD and steady assistance to ADL apartment for bathing and dressing task. Pt requiring assistance to not ambulate into objects/obstacles to the L when ambulating. Pt performed stand pivot transfer onto TTB with close supervision and mod verbal cues for safety. Pt remaining seated while bathing with lateral leans in order to wash buttocks. Pt needing min cues for initiation and sequencing when dressing and bathing. Pt needing hand over hand assist in order to incorporate L UE in self care tasks. OT provided pt with long handled sponge and educated on use in order to increase I with self care. Pt dressed while seated in chair within bathroom with steady assistance for sit <>stand. Pt ambulating back to room in same manner as stated above. Pt returning to supine with bed alarm activated. Call bell and all needed items within reach upon exiting the room.   Therapy Documentation Precautions:  Precautions Precautions: Fall Precaution Comments: L hemiparesis, L inattention Restrictions Weight Bearing Restrictions: No  See Function Navigator for Current Functional Status.   Therapy/Group: Individual Therapy  [name] 04/21/2015, 12:12 PM

## 2015-04-21 NOTE — Progress Notes (Signed)
Physical Therapy Weekly Progress Note  Patient Details  Name: Randall Patel MRN: [id number] Date of Birth: 03/20/1951  Beginning of progress report period: April 11, 2015 End of progress report period: April 20, 2014  Today's Date: 04/21/2015 PT Individual Time: 0759-0859 PT Individual Time Calculation (min): 60 min  Pt eating breakfast in bed upon therapist arrival. Transitioned to sitting EOB (with verbal cues to attend to LUE and LLE) for improved upright posture and focused on functional balance and attention to L while finishing breakfast. Addressed functional transfers and standing balance for transfers OOB, on/off toilet, and balance for hygiene, clothing management, and hand hygiene at sink with supervision to steady assist for mobility and min to mod verbal cues for attention to L during functional tasks. Stair negotiation training for home entry simulation with cues for technique and attention to LUE placement. Car transfer training to Jeep height with min assist but max verbal cues for body positioning awareness. Neuro re-ed with scooting activity edge of mat with and without mirror for visual feedback to increase awareness of body in space especially for carryover during car transfers and scooting L hip back on seat (ex. Toilet, car, and mat). Neuro re-ed throughout session to address midline re-orientation, balance reaction training, and overall postural control/awareness. Pt demonstrating improvement today with overall cognition and functional mobility.  Patient has met 3 of 3 short term goals. Pt is making good functional progress with mobility though continues to be limited by cognition. Pt requires overall  Min to mod verbal cues for attending to L side, initiation, attention, and awareness. Family education to occur this week to prepare for discharge home by end of week.  Patient continues to demonstrate the following deficits: hemiparesis, decreased balance, decreased postural  control, decreased awareness, decreased attention to L, decreased activity tolerance, decreased initiation, decreased functional mobility and therefore will continue to benefit from skilled PT intervention to enhance overall performance with activity tolerance, balance, postural control, ability to compensate for deficits, functional use of  left upper extremity and left lower extremity, attention, awareness and coordination.  Patient progressing toward long term goals..  Continue plan of care.  PT Short Term Goals Week 1:  PT Short Term Goal 1 (Week 1): Pt will be able to perform functional transfers with min assist level  PT Short Term Goal 1 - Progress (Week 1): Met PT Short Term Goal 2 (Week 1): Pt will be able to gait x 150' with min assist with LRAD PT Short Term Goal 2 - Progress (Week 1): Met PT Short Term Goal 3 (Week 1): Pt will be able to maintain midline positioning of trunk/head during functional activity x 2 min with mod verbal cues PT Short Term Goal 3 - Progress (Week 1): Met Week 2:  PT Short Term Goal 1 (Week 2): = LTGs  Skilled Therapeutic Interventions/Progress Updates:  Ambulation/gait training;Balance/vestibular training;Cognitive remediation/compensation;Community reintegration;Discharge planning;Disease management/prevention;DME/adaptive equipment instruction;Functional mobility training;Neuromuscular re-education;Pain management;Patient/family education;Psychosocial support;Splinting/orthotics;Stair training;Therapeutic Activities;Therapeutic Exercise;UE/LE Strength taining/ROM;UE/LE Coordination activities;Visual/perceptual remediation/compensation;Wheelchair propulsion/positioning   Therapy Documentation Precautions:  Precautions Precautions: Fall Precaution Comments: L hemiparesis, L inattention Restrictions Weight Bearing Restrictions: No  Pain:  Denies pain.   See Function Navigator for Current Functional Status.  Therapy/Group: Individual Therapy  Gray,  Alison Brescia  Alison B. Gray, PT, DPT  04/21/2015, 9:09 AM   

## 2015-04-22 ENCOUNTER — Inpatient Hospital Stay (HOSPITAL_COMMUNITY): Payer: Medicare Other | Admitting: Occupational Therapy

## 2015-04-22 ENCOUNTER — Inpatient Hospital Stay (HOSPITAL_COMMUNITY): Payer: Medicare Other | Admitting: Speech Pathology

## 2015-04-22 ENCOUNTER — Inpatient Hospital Stay (HOSPITAL_COMMUNITY): Payer: Medicare Other

## 2015-04-22 ENCOUNTER — Inpatient Hospital Stay (HOSPITAL_COMMUNITY): Payer: Medicare Other | Admitting: *Deleted

## 2015-04-22 NOTE — Progress Notes (Signed)
[place] PHYSICAL MEDICINE & REHABILITATION     PROGRESS NOTE    Subjective/Complaints:  Up eating breakfast. He reports that "not much has changed". OT reported that he was doing nicely with selffeeding today ROS: Denies CP, SOB, n/v/d, fevers, chills. No anxiety, visual issues.   Objective: Vital Signs: Blood pressure 149/85, pulse 62, temperature 98.6 F (37 C), temperature source Oral, resp. rate 18, height 5\' 5"  (1.651 m), weight 75.3 kg (166 lb 0.1 oz), SpO2 97 %. Dg Swallowing Func-speech Pathology  04/18/2015  Objective Swallowing Evaluation:   Patient Details Name: Randall Patel MRN: [id number] Date of Birth: March 08, 1951 Today's Date: 04/18/2015 Time: SLP Start Time (ACUTE ONLY): 0900-SLP Stop Time (ACUTE ONLY): 0930 SLP Time Calculation (min) (ACUTE ONLY): 30 min Past Surgical History: Past Surgical History Procedure Laterality Date . Hernia repair   . Left heart catheterization with coronary angiogram N/A 04/11/2012   Procedure: LEFT HEART CATHETERIZATION WITH CORONARY ANGIOGRAM;  Surgeon: Kathleene Hazel, MD;  Location: [facility] CATH LAB;  Service: Cardiovascular;  Laterality: N/A; . Percutaneous coronary stent intervention (pci-s)  04/11/2012   Procedure: PERCUTANEOUS CORONARY STENT INTERVENTION (PCI-S);  Surgeon: Kathleene Hazel, MD;  Location: [facility] CATH LAB;  Service: Cardiovascular;;  Distal RCA . Percutaneous coronary intervention-balloon only  04/11/2012   Procedure: PERCUTANEOUS CORONARY INTERVENTION-BALLOON ONLY;  Surgeon: Kathleene Hazel, MD;  Location: [facility] CATH LAB;  Service: Cardiovascular;;  RPLA HPI: Randall Patel is an 65 y.o. male with a hx of stroke and CAD s/p stenting presents with acute onset of L sided weakness and found to have Large R ICH on CT. PMH: HTN, CAD, ETOH abuse, tobacco abuse. No Data Recorded Assessment / Plan / Recommendation CHL IP CLINICAL IMPRESSIONS 04/18/2015 Therapy Diagnosis Moderate oral phase dysphagia;Mild pharyngeal phase dysphagia  Clinical Impression Pt presents with a moderate oral and mild pharyngeal dysphagia characterized by impaired oral control with premature spillage into pharynx and residue throughout left lateral sulcus with all liquids and poor bolus manipulation and mastication with soft solids. Patient also demonstrates reduced mobility of the hyolaryngeal complex leading to mild vallecular and pyriform sinus residue that cleared with spontaneous multiple swallows. Patient with one episode of flash penetration on initial sip of thin liquids, however, no penetration or aspiration observed with further trials of thin liquids via cup or straw. Recommend patient continue Dys. 1 textures and upgrade to thin liquids with full supervision for safety.  Impact on safety and function Mild aspiration risk   CHL IP TREATMENT RECOMMENDATION 04/08/2015 Treatment Recommendations Therapy as outlined in treatment plan below   Prognosis 04/18/2015 Prognosis for Safe Diet Advancement Good Barriers to Reach Goals Cognitive deficits Barriers/Prognosis Comment -- CHL IP DIET RECOMMENDATION 04/18/2015 SLP Diet Recommendations Dysphagia 1 (Puree) solids;Thin liquid Liquid Administration via Cup;Straw Medication Administration Crushed with puree Compensations Slow rate;Small sips/bites;Lingual sweep for clearance of pocketing;Multiple dry swallows after each bite/sip Postural Changes Remain semi-upright after after feeds/meals (Comment);Seated upright at 90 degrees   CHL IP OTHER RECOMMENDATIONS 04/18/2015 Recommended Consults -- Oral Care Recommendations Oral care BID Other Recommendations --   CHL IP FOLLOW UP RECOMMENDATIONS 04/09/2015 Follow up Recommendations Inpatient Rehab   CHL IP FREQUENCY AND DURATION 04/08/2015 Speech Therapy Frequency (ACUTE ONLY) min 2x/week Treatment Duration 2 weeks      CHL IP ORAL PHASE 04/18/2015 Oral Phase Impaired Oral - Pudding Teaspoon -- Oral - Pudding Cup -- Oral - Honey Teaspoon -- Oral - Honey Cup NT Oral - Nectar  Teaspoon -- Oral - Nectar Cup  Left pocketing in lateral sulci;Decreased bolus cohesion;Left anterior bolus loss;Premature spillage;Weak lingual manipulation Oral - Nectar Straw -- Oral - Thin Teaspoon Left anterior bolus loss;Left pocketing in lateral sulci;Decreased bolus cohesion;Weak lingual manipulation;Premature spillage Oral - Thin Cup Premature spillage;Decreased bolus cohesion;Weak lingual manipulation;Left anterior bolus loss;Left pocketing in lateral sulci Oral - Thin Straw Weak lingual manipulation;Left anterior bolus loss;Left pocketing in lateral sulci;Decreased bolus cohesion;Premature spillage Oral - Puree Weak lingual manipulation;Left pocketing in lateral sulci;Decreased bolus cohesion;Delayed oral transit Oral - Mech Soft Impaired mastication;Weak lingual manipulation;Decreased bolus cohesion;Delayed oral transit;Left pocketing in lateral sulci Oral - Regular -- Oral - Multi-Consistency -- Oral - Pill -- Oral Phase - Comment --  CHL IP PHARYNGEAL PHASE 04/18/2015 Pharyngeal Phase Impaired Pharyngeal- Pudding Teaspoon -- Pharyngeal -- Pharyngeal- Pudding Cup -- Pharyngeal -- Pharyngeal- Honey Teaspoon -- Pharyngeal -- Pharyngeal- Honey Cup NT Pharyngeal -- Pharyngeal- Nectar Teaspoon -- Pharyngeal -- Pharyngeal- Nectar Cup Delayed swallow initiation-vallecula;Reduced tongue base retraction;Pharyngeal residue - valleculae;Reduced anterior laryngeal mobility;Reduced laryngeal elevation Pharyngeal -- Pharyngeal- Nectar Straw -- Pharyngeal -- Pharyngeal- Thin Teaspoon Delayed swallow initiation-vallecula;Reduced tongue base retraction;Pharyngeal residue - valleculae;Pharyngeal residue - pyriform;Reduced laryngeal elevation;Reduced anterior laryngeal mobility Pharyngeal -- Pharyngeal- Thin Cup Delayed swallow initiation-vallecula;Pharyngeal residue - valleculae;Pharyngeal residue - pyriform;Reduced tongue base retraction;Reduced anterior laryngeal mobility;Reduced laryngeal elevation Pharyngeal --  Pharyngeal- Thin Straw Delayed swallow initiation-pyriform sinuses;Reduced laryngeal elevation;Reduced tongue base retraction;Reduced anterior laryngeal mobility;Pharyngeal residue - valleculae;Pharyngeal residue - pyriform Pharyngeal -- Pharyngeal- Puree Delayed swallow initiation-vallecula;Reduced laryngeal elevation;Reduced anterior laryngeal mobility;Reduced tongue base retraction Pharyngeal -- Pharyngeal- Mechanical Soft Delayed swallow initiation-vallecula;Reduced tongue base retraction;Reduced laryngeal elevation;Reduced anterior laryngeal mobility Pharyngeal -- Pharyngeal- Regular -- Pharyngeal -- Pharyngeal- Multi-consistency -- Pharyngeal -- Pharyngeal- Pill -- Pharyngeal -- Pharyngeal Comment --  CHL IP CERVICAL ESOPHAGEAL PHASE 04/18/2015 Cervical Esophageal Phase Impaired Pudding Teaspoon -- Pudding Cup -- Honey Teaspoon -- Honey Cup -- Nectar Teaspoon -- Nectar Cup -- Nectar Straw -- Thin Teaspoon -- Thin Cup -- Thin Straw -- Puree -- Mechanical Soft -- Regular -- Multi-consistency -- Pill -- Cervical Esophageal Comment prominent CP  PAYNE, COURTNEY 04/18/2015, 12:39 PM              No results for input(s): WBC, HGB, HCT, PLT in the last 72 hours. No results for input(s): NA, K, CL, GLUCOSE, BUN, CREATININE, CALCIUM in the last 72 hours.  Invalid input(s): CO CBG (last 3)   Recent Labs  04/19/15 1130 04/21/15 0653  GLUCAP 84 84    Wt Readings from Last 3 Encounters:  04/16/15 75.3 kg (166 lb 0.1 oz)  04/04/15 71.1 kg (156 lb 12 oz)  02/20/15 74.844 kg (165 lb)    Physical Exam:  BP 149/85 mmHg  Pulse 62  Temp(Src) 98.6 F (37 C) (Oral)  Resp 18  Ht 5\' 5"  (1.651 m)  Wt 75.3 kg (166 lb 0.1 oz)  BMI 27.62 kg/m2  SpO2 97% Constitutional: He appears well-developed and well-nourished. Vitals reviewed. HENT: Normocephalic and atraumatic.  Eyes: Conjunctivae and EOM are normal.  Neck: Normal range of motion. Neck supple.  Cardiovascular: Normal rate and regular rhythm.   Respiratory: Effort normal and breath sounds normal. No respiratory distress. He has no wheezes.  GI: Soft. Bowel sounds are normal. He exhibits no distension. There is no tenderness.  Musculoskeletal: He exhibits no edema. TTP over posterior neck.  Neurological: He is alert and oriented. Coordination abnormal.  Left facial weakness with mild dysarthria. left inattention, minimal food pocketing Able to follow commands.  RUE 4/5 delt,  4/5 Biceps due to pain 5/5 triceps and grip 1/5 delt, pec, bicep, tricep, 0/5 wrist/hand 3-4/5 L KE, 3- to 3/5 L HF and 3 ADF---exam unchanged 5/5 in RLE Sensation intact BUE and BLE  Skin: Skin is warm and dry.  Psych: affect very flat  Assessment/Plan: 1. Functional deficits secondary to Right frontal hypertensive ICH which require 3+ hours per day of interdisciplinary therapy in a comprehensive inpatient rehab setting. Physiatrist is providing close team supervision and 24 hour management of active medical problems listed below. Physiatrist and rehab team continue to assess barriers to discharge/monitor patient progress toward functional and medical goals.  Function:  Bathing Bathing position   Position: Shower  Bathing parts Body parts bathed by patient: Left arm, Chest, Front perineal area, Abdomen, Right upper leg, Left upper leg, Buttocks, Right lower leg, Left lower leg Body parts bathed by helper: Right arm, Back  Bathing assist Assist Level: Touching or steadying assistance(Pt > 75%)      Upper Body Dressing/Undressing Upper body dressing   What is the patient wearing?: Pull over shirt/dress     Pull over shirt/dress - Perfomed by patient: Thread/unthread right sleeve, Put head through opening, Pull shirt over trunk Pull over shirt/dress - Perfomed by helper: Thread/unthread left sleeve        Upper body assist Assist Level: Touching or steadying assistance(Pt > 75%)      Lower Body Dressing/Undressing Lower body dressing    What is the patient wearing?: Underwear, Pants, Non-skid slipper socks Underwear - Performed by patient: Thread/unthread right underwear leg, Pull underwear up/down Underwear - Performed by helper: Thread/unthread left underwear leg Pants- Performed by patient: Thread/unthread right pants leg, Thread/unthread left pants leg, Pull pants up/down Pants- Performed by helper: Pull pants up/down, Fasten/unfasten pants Non-skid slipper socks- Performed by patient: Don/doff left sock, Don/doff right sock Non-skid slipper socks- Performed by helper: Don/doff right sock, Don/doff left sock Socks - Performed by patient: Don/doff right sock Socks - Performed by helper: Don/doff left sock Shoes - Performed by patient: Don/doff right shoe Shoes - Performed by helper: Don/doff left shoe, Fasten right, Fasten left          Lower body assist Assist for lower body dressing:  (min A)      Toileting Toileting   Toileting steps completed by patient: Adjust clothing prior to toileting, Performs perineal hygiene, Adjust clothing after toileting Toileting steps completed by helper: Adjust clothing after toileting Toileting Assistive Devices: Grab bar or rail  Toileting assist Assist level: Touching or steadying assistance (Pt.75%)   Transfers Chair/bed transfer   Chair/bed transfer method: Ambulatory, Stand pivot Chair/bed transfer assist level: Supervision or verbal cues Chair/bed transfer assistive device: Armrests     Locomotion Ambulation     Max distance: 150 Assist level: Touching or steadying assistance (Pt > 75%)   Wheelchair       Assist Level: Dependent (Pt equals 0%)  Cognition Comprehension Comprehension assist level: Understands basic 75 - 89% of the time/ requires cueing 10 - 24% of the time  Expression Expression assist level: Expresses basic 90% of the time/requires cueing < 10% of the time.  Social Interaction Social Interaction assist level: Interacts appropriately 75 - 89%  of the time - Needs redirection for appropriate language or to initiate interaction.  Problem Solving Problem solving assist level: Solves basic 25 - 49% of the time - needs direction more than half the time to initiate, plan or complete simple activities  Memory Memory assist level: Recognizes  or recalls 50 - 74% of the time/requires cueing 25 - 49% of the time    Medical Problem List and Plan: 1. Left hemiparesis, severe impairment RUE, moderate LLE gait disorder and dysphargia secondary to Right frontal hypertensive ICH on 12/23  -cont bowel and bladder- enc pt to try urinal rather than condom cath 2. DVT Prophylaxis/Anticoagulation: Pharmaceutical: Heparin, prophyllactic dose, local care for RUE DVT--no changes 3. Pain Management: Tylenol prn for pain.  4. Mood: LCSW to follow for evaluation and support.  5. Neuropsych: This patient is capable of making decisions on his own behalf. 6. Skin/Wound Care: Routine pressure relief measures 7. Fluids/Electrolytes/Nutrition: Monitor I/O. Offer supplements between meals.   -  8. Malignant HTN: Monitor BP every 6 hours , 149/82 this am  Continue cozaar  Coreg increased to 25mg  on 12/31   9. CAD: Monitor for symptoms with increase in activity level. Continue coreg and cozaar. ASA/Plavix discontinued.  10. ABLA: Monitor for now.   Hb 11.6 on 12/30  11.  Hx hyperlipidemia, check lipid panel, currently off  12.  Dyphagia D2, thin- no sign of aspiration at this point   -taking his time, clearing mouth/palate before next bite  LOS (Days) 12 A FACE TO FACE EVALUATION WAS PERFORMED  Sherree Shankman T 04/22/2015 10:56 AM

## 2015-04-22 NOTE — Progress Notes (Signed)
Physical Therapy Session Note  Patient Details  Name: Randall Patel MRN: [id number] Date of Birth: [birth year]  Today's Date: 04/22/2015 PT Individual Time: 0900-1000 PT Individual Time Calculation (min): 60 min   Short Term Goals: Week 2:  PT Short Term Goal 1 (Week 2): = LTGs  Skilled Therapeutic Interventions/Progress Updates:    Session focused on functional gait on unit with focus on awareness to obstacles (especially on L) and safety with mobility at close supervision level, car transfer training to Jeep height to prepare for discharge with steady assist overall and less cues needed than yesterday for awareness to positioning (min verbal cues), stair negotiation training for home mobility training with min assist and verbal cues for attention to L foot placement, neuro re-ed to address balance, postural control, reorientation to midline, and forced use of LUE and LLE,  dynamic gait through obstacle course to simulate home and community mobility with verbal cues for obstacles on L, sidestepping to R and L to address hip abduction strength, and introduced [place] HEP for LE strengthening and balance training. Neuro re-ed on Kinetron in standing while performing reaching task to L also to match and locate cards as well as maintaining balance and resistance training on 30 cm/sec resistance.   Therapy Documentation Precautions:  Precautions Precautions: Fall Precaution Comments: L hemiparesis, L inattention Restrictions Weight Bearing Restrictions: No   Pain:  Denies pain.   See Function Navigator for Current Functional Status.   Therapy/Group: Individual Therapy  Karolee Stamps Darrol Poke, PT, DPT  04/22/2015, 11:22 AM

## 2015-04-22 NOTE — Progress Notes (Signed)
Speech Language Pathology Daily Session Note  Patient Details  Name: Randall Patel MRN: [id number] Date of Birth: 1951-02-07  Today's Date: 04/22/2015 SLP Individual Time: 1100-1200 SLP Individual Time Calculation (min): 60 min  Short Term Goals: Week 2: SLP Short Term Goal 1 (Week 2): Pt will consume current diet with min assist verbal cues to monitor and correct anterior loss of boluses over 2 targeted sessions.   SLP Short Term Goal 2 (Week 2): Pt will consume trials of dys 2 textures with mod assist verbal cues to monitor and correct left sided buccal residue over 3 targeted sessions prior to advancement.   SLP Short Term Goal 3 (Week 2): Pt will attend to the left environment during basic, familiar tasks with mod assist verbal cues.   SLP Short Term Goal 4 (Week 2): Pt will sustain his attention to a basic, familiar task for 3-5 minute intervals with min assist verbal cues for redirection.    Skilled Therapeutic Interventions: Skilled treatment session focused on cognitive and dysphagia goals. SLP facilitated session by providing Min A verbal cues for recall and Min A question cues for functional problem solving during a basic money management task.  Patient also required total A multimodal cues for anticipatory awareness in regards to tasks that he will be unable to participate in at discharge due to safety.  SLP also facilitated session by providing skilled observation with lunch meal of Dys. 2 textures with thin liquids. Patient consumed meal with minimal overt s/s of aspiration, suspect due to mixed consistencies and required Min A verbal cues for use of swallowing compensatory strategies. Recommend patient remain on Dys. 2 textures with full supervision. Patient left supine in bed with alarm on and all needs within reach. Continue with current plan of care.    Function:  Eating Eating   Modified Consistency Diet: Yes Eating Assist Level: Supervision or verbal cues;Set up assist for   Eating Set Up Assist For: Opening containers       Cognition Comprehension Comprehension assist level: Understands basic 75 - 89% of the time/ requires cueing 10 - 24% of the time  Expression   Expression assist level: Expresses basic 90% of the time/requires cueing < 10% of the time.  Social Interaction Social Interaction assist level: Interacts appropriately 75 - 89% of the time - Needs redirection for appropriate language or to initiate interaction.  Problem Solving Problem solving assist level: Solves basic 50 - 74% of the time/requires cueing 25 - 49% of the time  Memory Memory assist level: Recognizes or recalls 50 - 74% of the time/requires cueing 25 - 49% of the time    Pain Pain Assessment Pain Assessment: No/denies pain  Therapy/Group: Individual Therapy  Caeden Foots 04/22/2015, 12:07 PM

## 2015-04-22 NOTE — Progress Notes (Signed)
Occupational Therapy Session Note  Patient Details  Name: Randall Patel MRN: [id number] Date of Birth: 09/10/1950  Today's Date: 04/22/2015 OT Individual Time: 0800-0900 and 1500-1530 OT Individual Time Calculation (min): 60 min and 30 min    Short Term Goals: Week 2:  OT Short Term Goal 1 (Week 2): STGs=LTGS secondary to upcoming discharge.  Skilled Therapeutic Interventions/Progress Updates:  Session 1: Upon entering the room, pt seated in bed with head elevated. NT present in room to provide supervision for safety. Pt doing well with following swallowing strategies such as taking small bites. Pt needing min verbal cues to locate food items on L side of tray table. Once pt finished food, pt ambulated with steady assist to obtain clothing items need and engaged in bathing and dressing task with sit <>stand at sink side. Pt needing mod verbal cues for safety and awareness with sit <>stand transfer and placement of L UE with tasks. Pt continues to have difficulty with hemiplegic dressing techniques. Pt always starts on L side but does not make sure clothing completely on extremity before dressing R side. Hand over hand assist utilize to incorporate L UE in functional tasks. Pt seated in wheelchair with arm tray on and call bell within reach upon exiting the room.    Session 2: Upon entering the room, pt supine in bed with with sister present. Pt's caregiver having several questions regarding pt's progress and recommendations for upcoming discharge date. OT educated caregiver and discussed planned family education for tomorrow. Pt performed supine >sit with supervision. He donned B shoes but required assistance to tie laces. Pt ambulating with steady assist as pt needed assistance to keep from ambulating into obstacles on L side. Pt seated on toilet as he reported need for BM. He was unsuccessful but able to perform clothing management with mod verbal cues for safety and technique. Pt washed hands  while standing with min verbal cues. Pt returned to bed at end of session with call bell and all needed items within reach. Bed alarm activated.   Therapy Documentation Precautions:  Precautions Precautions: Fall Precaution Comments: L hemiparesis, L inattention Restrictions Weight Bearing Restrictions: No   Pain: Pain Assessment Pain Assessment: No/denies pain  See Function Navigator for Current Functional Status.   Therapy/Group: Individual Therapy  Lowella Grip 04/22/2015, 12:17 PM

## 2015-04-23 ENCOUNTER — Inpatient Hospital Stay (HOSPITAL_COMMUNITY): Payer: Medicare Other

## 2015-04-23 ENCOUNTER — Inpatient Hospital Stay (HOSPITAL_COMMUNITY): Payer: Medicare Other | Admitting: Speech Pathology

## 2015-04-23 ENCOUNTER — Inpatient Hospital Stay (HOSPITAL_COMMUNITY): Payer: Medicare Other | Admitting: Occupational Therapy

## 2015-04-23 MED ORDER — ENSURE ENLIVE PO LIQD
237.0000 mL | Freq: Two times a day (BID) | ORAL | Status: DC
  Administered 2015-04-24: 237 mL via ORAL

## 2015-04-23 NOTE — Progress Notes (Signed)
Social Work Patient ID: Randall Patel, male   DOB: [birth year], 65 y.o.   MRN: [id number]   Have reviewed team conference with pt and sister.  Family ed completed today and both feeling ready for Friday d/c.  Will make referrals for HH txs.    Jullisa Grigoryan, LCSW

## 2015-04-23 NOTE — Patient Care Conference (Signed)
Inpatient RehabilitationTeam Conference and Plan of Care Update Date: 04/22/2015   Time: 2:55 PM    Patient Name: Randall Patel      Medical Record Number: [id number]  Date of Birth: 1951/03/26 Sex: Male         Room/Bed: 4W21C/4W21C-01 Payor Info: Payor: MEDICARE / Plan: MEDICARE PART A AND B / Product Type: *No Product type* /    Admitting Diagnosis: ich yes slp mod assist   Admit Date/Time:  04/10/2015  3:34 PM Admission Comments: No comment available   Primary Diagnosis:  Hemiparesis affecting left side as late effect of stroke (HCC) Principal Problem: Hemiparesis affecting left side as late effect of stroke [facility])  Patient Active Problem List   Diagnosis Date Noted  . Muscle stiffness   . Cough   . Hemiparesis affecting left side as late effect of stroke (HCC)   . Dysphagia as late effect of cerebrovascular disease   . Essential hypertension   . Acute blood loss anemia   . 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 Dysphagia   . Dysarthria due to cerebrovascular accident   . Diastolic dysfunction   . Tobacco abuse   . ETOH abuse   . History of CVA with residual deficit   . Coronary artery disease involving native coronary artery of native heart with angina pectoris (HCC)   . Tachypnea   . Hypernatremia   . Hypokalemia   . Thrombocytopenia (HCC)   . Intracranial hemorrhage (HCC)   . Essential hypertension, malignant   . ICH (intracerebral hemorrhage) (HCC) 04/04/2015  . STEMI (ST elevation myocardial infarction) (HCC) 04/14/2012  . NSTEMI (non-ST elevated myocardial infarction) (HCC) 04/14/2012  . Cardiomyopathy, ischemic 04/14/2012  . Cerebral embolism with cerebral infarction (HCC) 04/03/2012  . Unspecified transient cerebral ischemia 04/02/2012  . Hemiplegia, unspecified, affecting nondominant side 04/02/2012  . CAD (coronary artery disease) s/p stent 2008 in [place] 04/02/2012  . Hypertension     Expected Discharge Date: Expected  Discharge Date: 04/25/15  Team Members Present: Physician leading conference: Dr. Faith Rogue Social Worker Present: Amada Jupiter, LCSW Nurse Present: Carmie End, RN PT Present: Karolee Stamps, Nita Sickle, PT OT Present: Jaci Lazier, OT SLP Present: Feliberto Gottron, SLP PPS Coordinator present : Tora Duck, RN, CRRN     Current Status/Progress Goal Weekly Team Focus  Medical   swallow progressing. distal LUE without much improvement. overall pt making functional gains  maximize nutrition, stable bp control  bp, nutrition, swallowing, education   Bowel/Bladder   continent of bowel and bladder, LBM 1/7  remain continent of bowel and bladder with min assist  assess for consitpation, encourage fluids   Swallow/Nutrition/ Hydration   Dys. 2 texutres with thin liquids, supervision-Min A for use of swallowing strategies   Min A with least restrictive diet   tolerance with current diet, increased use of swallowing strategies, Family Edu   ADL's   Min A bathing, UB and LB self care, steady assist for functional transfers and ambulation without AD  supervision overall  balance, L NMR, pt/ famly education, d/c planning   Mobility   supervision to min assist for transfers, gait, stairs, and balance  supervision to min assist overall  L NMR, attention, postural control, gait, stairs, family education, strengthening, cognition   Communication   Supervision  Supervision   increased use of speech intelligibility strategies, Family Edu    Safety/Cognition/ Behavioral Observations  Mod A  Min A   attention,  attention to left field of enviornment, problem solving, awareness and family edu   Pain   patient makes no complait of pain  pain less than or equal to 2 on a scale of 1-10  assess pain q4h, treat if indicated   Skin   skin clean dry and intact  remain free from injjury/breakdown with min assist  assess skin q shift    Rehab Goals Patient on target to meet rehab goals:  Yes *See Care Plan and progress notes for long and short-term goals.  Barriers to Discharge: left hemiparesis    Possible Resolutions to Barriers:  continued adaptive skills and education    Discharge Planning/Teaching Needs:  Plan to d/c home with sister and other family who can provide 24/7 assistance.  Family ed scheduled with sister for tomorrow @ 1:00 pm   Team Discussion:  Making good gains and to meet goals.  Family ed planned for Wed afternoon.  No concerns.    Revisions to Treatment Plan:  None   Continued Need for Acute Rehabilitation Level of Care: The patient requires daily medical management by a physician with specialized training in physical medicine and rehabilitation for the following conditions: Daily direction of a multidisciplinary physical rehabilitation program to ensure safe treatment while eliciting the highest outcome that is of practical value to the patient.: Yes Daily medical management of patient stability for increased activity during participation in an intensive rehabilitation regime.: Yes Daily analysis of laboratory values and/or radiology reports with any subsequent need for medication adjustment of medical intervention for : Blood pressure problems;Neurological problems  Randall Patel 04/23/2015, 4:29 PM

## 2015-04-23 NOTE — Progress Notes (Signed)
Nutrition Follow-up  DOCUMENTATION CODES:   Not applicable  INTERVENTION:  Provide Ensure Enlive po BID, each supplement provides 350 kcal and 20 grams of protein.  Encourage adequate PO intake .  NUTRITION DIAGNOSIS:   Inadequate oral intake related to poor appetite as evidenced by per patient/family report; improving  GOAL:   Patient will meet greater than or equal to 90% of their needs; met  MONITOR:   PO intake, Supplement acceptance, Diet advancement, Weight trends, Labs, I & O's  REASON FOR ASSESSMENT:   Malnutrition Screening Tool    ASSESSMENT:   65 y.o. 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 Presented 04/04/2015 with headache and blood pressure 189/128. Denies any recent trauma. Cranial CT scan showed a large right frontoparietal intracerebral hemorrhage. Follow-up cranial CT scan 04/07/2015 with evolving right frontal intraparenchymal hematoma right to left midline shift without ventricular entrapment.  Meal completion has been varied from 25-100%, however most meals have been 75-100%. Diet has been advanced to a dysphagia 2 diet. Pt currently has Ensure ordered and has been consuming them. RD to continue with current orders. Pt encouraged to eat his food at meals.   Diet Order:  DIET DYS 2 Room service appropriate?: Yes; Fluid consistency:: Thin  Skin:  Reviewed, no issues  Last BM:  1/10  Height:   Ht Readings from Last 1 Encounters:  04/10/15 '5\' 5"'  (1.651 m)    Weight:   Wt Readings from Last 1 Encounters:  04/16/15 166 lb 0.1 oz (75.3 kg)    Ideal Body Weight:  61.8 kg  BMI:  Body mass index is 27.62 kg/(m^2).  Estimated Nutritional Needs:   Kcal:  1900-2100  Protein:  90-100 grams  Fluid:  1.9 - 2.1 L/day  EDUCATION NEEDS:   No education needs identified at this time  Corrin Parker, MS, RD, LDN Pager # [phone] After hours/  weekend pager # [phone]

## 2015-04-23 NOTE — Plan of Care (Signed)
Problem: RH Leisure Awareness Goal: LTG: Patient will participate in leisure activities (TR) LTG: Patient will participate in leisure activities (simple/moderate/difficult) to increase ability to functionally perform activity, identify and utilize resources, identify new leisure interests, utilize adaptive equipment at specific level (TR)  Outcome: Not Applicable Date Met:  74/16/38 Goal discharged.  Pt with limited interest & participation in TR sessions.

## 2015-04-23 NOTE — Progress Notes (Signed)
[facility] PHYSICAL MEDICINE & REHABILITATION     PROGRESS NOTE    Subjective/Complaints:  Feeding himself breakfast. No new complaints. Sister coming in for education. Denies pain today.   ROS: Denies CP, SOB, n/v/d, fevers, chills. No anxiety, visual issues.   Objective: Vital Signs: Blood pressure 135/88, pulse 60, temperature 98.5 F (36.9 C), temperature source Oral, resp. rate 17, height 5\' 5"  (1.651 m), weight 75.3 kg (166 lb 0.1 oz), SpO2 98 %. Dg Swallowing Func-speech Pathology  04/18/2015  Objective Swallowing Evaluation:   Patient Details Name: Randall Patel MRN: [id number] Date of Birth: [birth year] Today's Date: 04/18/2015 Time: SLP Start Time (ACUTE ONLY): 0900-SLP Stop Time (ACUTE ONLY): 0930 SLP Time Calculation (min) (ACUTE ONLY): 30 min Past Surgical History: Past Surgical History Procedure Laterality Date . Hernia repair   . Left heart catheterization with coronary angiogram N/A 04/11/2012   Procedure: LEFT HEART CATHETERIZATION WITH CORONARY ANGIOGRAM;  Surgeon: Kathleene Hazel, MD;  Location: [facility] CATH LAB;  Service: Cardiovascular;  Laterality: N/A; . Percutaneous coronary stent intervention (pci-s)  04/11/2012   Procedure: PERCUTANEOUS CORONARY STENT INTERVENTION (PCI-S);  Surgeon: Kathleene Hazel, MD;  Location: [facility] CATH LAB;  Service: Cardiovascular;;  Distal RCA . Percutaneous coronary intervention-balloon only  04/11/2012   Procedure: PERCUTANEOUS CORONARY INTERVENTION-BALLOON ONLY;  Surgeon: Kathleene Hazel, MD;  Location: [facility] CATH LAB;  Service: Cardiovascular;;  RPLA HPI: Randall Patel is an 65 y.o. male with a hx of stroke and CAD s/p stenting presents with acute onset of L sided weakness and found to have Large R ICH on CT. PMH: HTN, CAD, ETOH abuse, tobacco abuse. No Data Recorded Assessment / Plan / Recommendation CHL IP CLINICAL IMPRESSIONS 04/18/2015 Therapy Diagnosis Moderate oral phase dysphagia;Mild pharyngeal phase dysphagia Clinical Impression Pt  presents with a moderate oral and mild pharyngeal dysphagia characterized by impaired oral control with premature spillage into pharynx and residue throughout left lateral sulcus with all liquids and poor bolus manipulation and mastication with soft solids. Patient also demonstrates reduced mobility of the hyolaryngeal complex leading to mild vallecular and pyriform sinus residue that cleared with spontaneous multiple swallows. Patient with one episode of flash penetration on initial sip of thin liquids, however, no penetration or aspiration observed with further trials of thin liquids via cup or straw. Recommend patient continue Dys. 1 textures and upgrade to thin liquids with full supervision for safety.  Impact on safety and function Mild aspiration risk   CHL IP TREATMENT RECOMMENDATION 04/08/2015 Treatment Recommendations Therapy as outlined in treatment plan below   Prognosis 04/18/2015 Prognosis for Safe Diet Advancement Good Barriers to Reach Goals Cognitive deficits Barriers/Prognosis Comment -- CHL IP DIET RECOMMENDATION 04/18/2015 SLP Diet Recommendations Dysphagia 1 (Puree) solids;Thin liquid Liquid Administration via Cup;Straw Medication Administration Crushed with puree Compensations Slow rate;Small sips/bites;Lingual sweep for clearance of pocketing;Multiple dry swallows after each bite/sip Postural Changes Remain semi-upright after after feeds/meals (Comment);Seated upright at 90 degrees   CHL IP OTHER RECOMMENDATIONS 04/18/2015 Recommended Consults -- Oral Care Recommendations Oral care BID Other Recommendations --   CHL IP FOLLOW UP RECOMMENDATIONS 04/09/2015 Follow up Recommendations Inpatient Rehab   CHL IP FREQUENCY AND DURATION 04/08/2015 Speech Therapy Frequency (ACUTE ONLY) min 2x/week Treatment Duration 2 weeks      CHL IP ORAL PHASE 04/18/2015 Oral Phase Impaired Oral - Pudding Teaspoon -- Oral - Pudding Cup -- Oral - Honey Teaspoon -- Oral - Honey Cup NT Oral - Nectar Teaspoon -- Oral - Nectar Cup  Left pocketing in lateral  sulci;Decreased bolus cohesion;Left anterior bolus loss;Premature spillage;Weak lingual manipulation Oral - Nectar Straw -- Oral - Thin Teaspoon Left anterior bolus loss;Left pocketing in lateral sulci;Decreased bolus cohesion;Weak lingual manipulation;Premature spillage Oral - Thin Cup Premature spillage;Decreased bolus cohesion;Weak lingual manipulation;Left anterior bolus loss;Left pocketing in lateral sulci Oral - Thin Straw Weak lingual manipulation;Left anterior bolus loss;Left pocketing in lateral sulci;Decreased bolus cohesion;Premature spillage Oral - Puree Weak lingual manipulation;Left pocketing in lateral sulci;Decreased bolus cohesion;Delayed oral transit Oral - Mech Soft Impaired mastication;Weak lingual manipulation;Decreased bolus cohesion;Delayed oral transit;Left pocketing in lateral sulci Oral - Regular -- Oral - Multi-Consistency -- Oral - Pill -- Oral Phase - Comment --  CHL IP PHARYNGEAL PHASE 04/18/2015 Pharyngeal Phase Impaired Pharyngeal- Pudding Teaspoon -- Pharyngeal -- Pharyngeal- Pudding Cup -- Pharyngeal -- Pharyngeal- Honey Teaspoon -- Pharyngeal -- Pharyngeal- Honey Cup NT Pharyngeal -- Pharyngeal- Nectar Teaspoon -- Pharyngeal -- Pharyngeal- Nectar Cup Delayed swallow initiation-vallecula;Reduced tongue base retraction;Pharyngeal residue - valleculae;Reduced anterior laryngeal mobility;Reduced laryngeal elevation Pharyngeal -- Pharyngeal- Nectar Straw -- Pharyngeal -- Pharyngeal- Thin Teaspoon Delayed swallow initiation-vallecula;Reduced tongue base retraction;Pharyngeal residue - valleculae;Pharyngeal residue - pyriform;Reduced laryngeal elevation;Reduced anterior laryngeal mobility Pharyngeal -- Pharyngeal- Thin Cup Delayed swallow initiation-vallecula;Pharyngeal residue - valleculae;Pharyngeal residue - pyriform;Reduced tongue base retraction;Reduced anterior laryngeal mobility;Reduced laryngeal elevation Pharyngeal -- Pharyngeal- Thin Straw Delayed swallow  initiation-pyriform sinuses;Reduced laryngeal elevation;Reduced tongue base retraction;Reduced anterior laryngeal mobility;Pharyngeal residue - valleculae;Pharyngeal residue - pyriform Pharyngeal -- Pharyngeal- Puree Delayed swallow initiation-vallecula;Reduced laryngeal elevation;Reduced anterior laryngeal mobility;Reduced tongue base retraction Pharyngeal -- Pharyngeal- Mechanical Soft Delayed swallow initiation-vallecula;Reduced tongue base retraction;Reduced laryngeal elevation;Reduced anterior laryngeal mobility Pharyngeal -- Pharyngeal- Regular -- Pharyngeal -- Pharyngeal- Multi-consistency -- Pharyngeal -- Pharyngeal- Pill -- Pharyngeal -- Pharyngeal Comment --  CHL IP CERVICAL ESOPHAGEAL PHASE 04/18/2015 Cervical Esophageal Phase Impaired Pudding Teaspoon -- Pudding Cup -- Honey Teaspoon -- Honey Cup -- Nectar Teaspoon -- Nectar Cup -- Nectar Straw -- Thin Teaspoon -- Thin Cup -- Thin Straw -- Puree -- Mechanical Soft -- Regular -- Multi-consistency -- Pill -- Cervical Esophageal Comment prominent CP  PAYNE, COURTNEY 04/18/2015, 12:39 PM              No results for input(s): WBC, HGB, HCT, PLT in the last 72 hours. No results for input(s): NA, K, CL, GLUCOSE, BUN, CREATININE, CALCIUM in the last 72 hours.  Invalid input(s): CO CBG (last 3)   Recent Labs  04/21/15 0653  GLUCAP 84    Wt Readings from Last 3 Encounters:  04/16/15 75.3 kg (166 lb 0.1 oz)  04/04/15 71.1 kg (156 lb 12 oz)  02/20/15 74.844 kg (165 lb)    Physical Exam:  BP 135/88 mmHg  Pulse 60  Temp(Src) 98.5 F (36.9 C) (Oral)  Resp 17  Ht 5\' 5"  (1.651 m)  Wt 75.3 kg (166 lb 0.1 oz)  BMI 27.62 kg/m2  SpO2 98% Constitutional: He appears well-developed and well-nourished. Vitals reviewed. HENT: Normocephalic and atraumatic.  Eyes: Conjunctivae and EOM are normal.  Neck: Normal range of motion. Neck supple.  Cardiovascular: Normal rate and regular rhythm.  Respiratory: Effort normal and breath sounds normal. No  respiratory distress. He has no wheezes.  GI: Soft. Bowel sounds are normal. He exhibits no distension. There is no tenderness.  Musculoskeletal: He exhibits no edema. TTP over posterior neck.  Neurological: He is alert and oriented. Coordination abnormal.  Left facial weakness with mild dysarthria. left inattention, minimal food pocketing Able to follow commands.  RUE 4/5 delt, 4/5 Biceps due to pain 5/5 triceps  and grip 1/5 delt, pec, bicep, tricep, 0/5 wrist/hand 3-4/5 L KE, 3- to 3/5 L HF and 3 ADF---exam unchanged 5/5 in RLE Sensation intact BUE and BLE  Skin: Skin is warm and dry.  Psych: affect very flat  Assessment/Plan: 1. Functional deficits secondary to Right frontal hypertensive ICH which require 3+ hours per day of interdisciplinary therapy in a comprehensive inpatient rehab setting. Physiatrist is providing close team supervision and 24 hour management of active medical problems listed below. Physiatrist and rehab team continue to assess barriers to discharge/monitor patient progress toward functional and medical goals.  Function:  Bathing Bathing position   Position: Shower  Bathing parts Body parts bathed by patient: Left arm, Chest, Front perineal area, Abdomen, Right upper leg, Left upper leg, Buttocks, Right lower leg, Left lower leg Body parts bathed by helper: Right arm, Back  Bathing assist Assist Level: Touching or steadying assistance(Pt > 75%)      Upper Body Dressing/Undressing Upper body dressing   What is the patient wearing?: Pull over shirt/dress     Pull over shirt/dress - Perfomed by patient: Thread/unthread right sleeve, Put head through opening, Pull shirt over trunk Pull over shirt/dress - Perfomed by helper: Thread/unthread left sleeve        Upper body assist Assist Level: Touching or steadying assistance(Pt > 75%)      Lower Body Dressing/Undressing Lower body dressing   What is the patient wearing?: Underwear, Pants, Socks,  Shoes Underwear - Performed by patient: Thread/unthread right underwear leg, Pull underwear up/down, Thread/unthread left underwear leg Underwear - Performed by helper: Thread/unthread left underwear leg Pants- Performed by patient: Thread/unthread right pants leg, Thread/unthread left pants leg, Pull pants up/down Pants- Performed by helper: Pull pants up/down, Fasten/unfasten pants Non-skid slipper socks- Performed by patient: Don/doff left sock, Don/doff right sock Non-skid slipper socks- Performed by helper: Don/doff right sock, Don/doff left sock Socks - Performed by patient: Don/doff right sock, Don/doff left sock Socks - Performed by helper: Don/doff left sock Shoes - Performed by patient: Don/doff right shoe, Don/doff left shoe Shoes - Performed by helper: Fasten right, Fasten left          Lower body assist Assist for lower body dressing:  (min a)      Interior and spatial designer   Toileting steps completed by patient: Adjust clothing prior to toileting, Performs perineal hygiene, Adjust clothing after toileting Toileting steps completed by helper: Adjust clothing after toileting Toileting Assistive Devices: Grab bar or rail  Toileting assist Assist level: Touching or steadying assistance (Pt.75%)   Transfers Chair/bed transfer   Chair/bed transfer method: Ambulatory, Stand pivot Chair/bed transfer assist level: Supervision or verbal cues Chair/bed transfer assistive device: Armrests     Locomotion Ambulation     Max distance: 150 Assist level: Touching or steadying assistance (Pt > 75%)   Wheelchair       Assist Level: Dependent (Pt equals 0%)  Cognition Comprehension Comprehension assist level: Understands basic 75 - 89% of the time/ requires cueing 10 - 24% of the time  Expression Expression assist level: Expresses basic 90% of the time/requires cueing < 10% of the time.  Social Interaction Social Interaction assist level: Interacts appropriately 75 - 89% of the time  - Needs redirection for appropriate language or to initiate interaction.  Problem Solving Problem solving assist level: Solves basic 50 - 74% of the time/requires cueing 25 - 49% of the time  Memory Memory assist level: Recognizes or recalls 50 - 74% of the time/requires cueing  25 - 49% of the time    Medical Problem List and Plan: 1. Left hemiparesis, severe impairment RUE, moderate LLE gait disorder and dysphargia secondary to Right frontal hypertensive ICH on 12/23  -cont bowel and bladder- enc pt to try urinal rather than condom cath 2. DVT Prophylaxis/Anticoagulation: Pharmaceutical: Heparin, prophyllactic dose, local care for RUE DVT--no changes 3. Pain Management: Tylenol prn for pain.  4. Mood: LCSW to follow for evaluation and support.  5. Neuropsych: This patient is capable of making decisions on his own behalf. 6. Skin/Wound Care: Routine pressure relief measures 7. Fluids/Electrolytes/Nutrition: Monitor I/O. Offer supplements between meals.   -  8. Malignant HTN: Monitor BP every 6 hours , 150/84 this am  Continue cozaar  Coreg increased to  on 12/31   9. CAD: Monitor for symptoms with increase in activity level. Continue coreg and cozaar. ASA/Plavix discontinued.  10. ABLA: Monitor for now.   Hb 11.6 on 12/30  11.  Hx hyperlipidemia, check lipid panel, currently off  12.  Dyphagia D2, thin-     -taking his time, clearing mouth/palate before next bite  LOS (Days) 13 A FACE TO FACE EVALUATION WAS PERFORMED  SWARTZ,ZACHARY T 04/23/2015 12:39 PM

## 2015-04-23 NOTE — Progress Notes (Addendum)
Physical Therapy Session Note  Patient Details  Name: Randall Patel MRN: [id number] Date of Birth: Apr 28, 1950  Today's Date: 04/23/2015 PT Individual Time:1405  - 1500    Short Term Goals: Week 2:  PT Short Term Goal 1 (Week 2): = LTGs    Skilled Therapeutic Interventions/Progress Updates:  Sister and brother here for family ed.  Sister trained and return demonstrated simulated car transfer to SUV ht seat, gait on level tile and carpet, gait up/down 4 6" high steps, cueing for L attention especially L hand and foot during mobility, L visual attention .  neuromuscular re-education via demo, Vcs, for L visual attention to find 10 yellow discs at floor>< door height.  Pt missed 5/5 disks on L on first pass. Kinetron in standing at level 30 cm/sec focusing on full wt shifting to L, L knee control, trunk shortening/lengthening.   Gait on stairs pt demonstrated L inattention with L toe catching step when ascending, recovered without LOB.  Max cues for sequencing on stairs due to pt's inconsistent sequencing    Toilet transfer in room; L inattention to L side of pants requiring cueing and assistance; PT educated sister on this aspect of L inattention as well. Gait stepping up onto scale resulted in staggering, slight LOB recovered by pt.   Pt passed off to SLP for next session.    Therapy Documentation Precautions:  Precautions Precautions: Fall Precaution Comments: L hemiparesis, L inattention Restrictions Weight Bearing Restrictions: No   Pain: Pain Assessment Pain Assessment: No/denies pain     See Function Navigator for Current Functional Status.   Therapy/Group: Individual Therapy  Zandrea Kenealy 04/23/2015, 2:48 PM

## 2015-04-23 NOTE — Progress Notes (Signed)
Occupational Therapy Session Note  Patient Details  Name: Randall Patel MRN: [id number] Date of Birth: 27-Jun-1950  Today's Date: 04/23/2015 OT Individual Time: 0930-1015 and 1300-1400 OT Individual Time Calculation (min): 45 min and 60 min    Short Term Goals: Week 2:  OT Short Term Goal 1 (Week 2): STGs=LTGS secondary to upcoming discharge.  Skilled Therapeutic Interventions/Progress Updates:  Session 1: Upon entering the room, pt reporting need for BM this session. Pt ambulating with close supervision and without use of AD to bathroom. Pt having successful BM and needing verbal cues for safety while standing for clothing management and hygiene. Pt ambulating to gym 120' with close supervision and without use of AD. OT obtaining shoe button and placing for pt in order to increase I with LB self care. Pt returning to room and sitting in recliner chair with quick release belt placed. Call bell and all needed items within reach upon exiting the room.   Session 2: Family present for family education. Pt with no c/o pain this session. Pt ambulating in room to obtain all clothing items without use of AD and close supervision. Pt needing mod verbal cues for safety with ambulation, sequencing, and safety awareness of L UE during tasks. Pt ambulating to ADL apartment with supervision and bathing while seated on TTB with supervision and cues from sister. Caregiver having difficulty with providing verbal cues and assisting pt instead with heavy education from therapist on this topic. Pt dressed while seated on toilet with verbal cues for hemiplegic technique. Pt returning to room in same manner with sister providing supervision and cues in order to keep pt from ambulating into obstacle on L side. Pt seated on EOB with family present and all needed items within reach.   Therapy Documentation Precautions:  Precautions Precautions: Fall Precaution Comments: L hemiparesis, L inattention Restrictions Weight  Bearing Restrictions: No  See Function Navigator for Current Functional Status.   Therapy/Group: Individual Therapy  Lowella Grip 04/23/2015, 12:56 PM

## 2015-04-23 NOTE — Progress Notes (Signed)
Speech Language Pathology Daily Session Note  Patient Details  Name: Randall Patel MRN: [id number] Date of Birth: Nov 01, 1950  Today's Date: 04/23/2015 SLP Individual Time: 1500-1530 SLP Individual Time Calculation (min): 30 min  Short Term Goals: Week 2: SLP Short Term Goal 1 (Week 2): Pt will consume current diet with min assist verbal cues to monitor and correct anterior loss of boluses over 2 targeted sessions.   SLP Short Term Goal 2 (Week 2): Pt will consume trials of dys 2 textures with mod assist verbal cues to monitor and correct left sided buccal residue over 3 targeted sessions prior to advancement.   SLP Short Term Goal 3 (Week 2): Pt will attend to the left environment during basic, familiar tasks with mod assist verbal cues.   SLP Short Term Goal 4 (Week 2): Pt will sustain his attention to a basic, familiar task for 3-5 minute intervals with min assist verbal cues for redirection.    Skilled Therapeutic Interventions: Skilled treatment session focused on patient and family education. SLP facilitated session by providing handouts and verbal education/demonstration in regards to patient's current swallowing function, diet recommendations, appropriate textures and swallowing compensatory strategies. Family also educated in regards to patient's current cognitive impairments and need for 24 hour supervision for utilization of strategies to utilize at home to maximize recall, problem solving, attention to left field of environment and overall safety. Patient's sister verbalized understanding of all information and asked appropriate questions. Patient left supine in bed with all needs within reach and alarm on. Continue with current plan of care.    Function:  Cognition Comprehension Comprehension assist level: Understands basic 75 - 89% of the time/ requires cueing 10 - 24% of the time  Expression   Expression assist level: Expresses basic 90% of the time/requires cueing < 10% of the  time.  Social Interaction Social Interaction assist level: Interacts appropriately 75 - 89% of the time - Needs redirection for appropriate language or to initiate interaction.  Problem Solving Problem solving assist level: Solves basic 50 - 74% of the time/requires cueing 25 - 49% of the time  Memory Memory assist level: Recognizes or recalls 50 - 74% of the time/requires cueing 25 - 49% of the time    Pain Pain Assessment Pain Assessment: No/denies pain  Therapy/Group: Individual Therapy  Marnie Fazzino 04/23/2015, 4:37 PM

## 2015-04-24 ENCOUNTER — Inpatient Hospital Stay (HOSPITAL_COMMUNITY): Payer: Medicare Other | Admitting: Speech Pathology

## 2015-04-24 ENCOUNTER — Inpatient Hospital Stay (HOSPITAL_COMMUNITY): Payer: Medicare Other | Admitting: Occupational Therapy

## 2015-04-24 ENCOUNTER — Inpatient Hospital Stay (HOSPITAL_COMMUNITY): Payer: Medicare Other

## 2015-04-24 NOTE — Progress Notes (Signed)
[place] PHYSICAL MEDICINE & REHABILITATION     PROGRESS NOTE    Subjective/Complaints:  Finishing breakfast. Feeling well excited about dc.   ROS: Denies CP, SOB, n/v/d, fevers, chills. No anxiety, visual issues.   Objective: Vital Signs: Blood pressure 141/78, pulse 60, temperature 98.3 F (36.8 C), temperature source Oral, resp. rate 17, height 5\' 5"  (1.651 m), weight 68.539 kg (151 lb 1.6 oz), SpO2 98 %. Dg Swallowing Func-speech Pathology  04/18/2015  Objective Swallowing Evaluation:   Patient Details Name: Randall Patel MRN: [id number] Date of Birth: [birth year] Today's Date: 04/18/2015 Time: SLP Start Time (ACUTE ONLY): 0900-SLP Stop Time (ACUTE ONLY): 0930 SLP Time Calculation (min) (ACUTE ONLY): 30 min Past Surgical History: Past Surgical History Procedure Laterality Date . Hernia repair   . Left heart catheterization with coronary angiogram N/A 04/11/2012   Procedure: LEFT HEART CATHETERIZATION WITH CORONARY ANGIOGRAM;  Surgeon: Kathleene Hazel, MD;  Location[facility] CATH LAB;  Service: Cardiovascular;  Laterality: N/A; . Percutaneous coronary stent intervention (pci-s)  04/11/2012   Procedure: PERCUTANEOUS CORONARY STENT INTERVENTION (PCI-S);  Surgeon: Kathleene Hazel, MD;  Location: [facility] CATH LAB;  Service: Cardiovascular;;  Distal RCA . Percutaneous coronary intervention-balloon only  04/11/2012   Procedure: PERCUTANEOUS CORONARY INTERVENTION-BALLOON ONLY;  Surgeon: Kathleene Hazel, MD;  Location: [facility] CATH LAB;  Service: Cardiovascular;;  RPLA HPI: Randall Patel is an 65 y.o. male with a hx of stroke and CAD s/p stenting presents with acute onset of L sided weakness and found to have Large R ICH on CT. PMH: HTN, CAD, ETOH abuse, tobacco abuse. No Data Recorded Assessment / Plan / Recommendation CHL IP CLINICAL IMPRESSIONS 04/18/2015 Therapy Diagnosis Moderate oral phase dysphagia;Mild pharyngeal phase dysphagia Clinical Impression Pt presents with a moderate oral and mild  pharyngeal dysphagia characterized by impaired oral control with premature spillage into pharynx and residue throughout left lateral sulcus with all liquids and poor bolus manipulation and mastication with soft solids. Patient also demonstrates reduced mobility of the hyolaryngeal complex leading to mild vallecular and pyriform sinus residue that cleared with spontaneous multiple swallows. Patient with one episode of flash penetration on initial sip of thin liquids, however, no penetration or aspiration observed with further trials of thin liquids via cup or straw. Recommend patient continue Dys. 1 textures and upgrade to thin liquids with full supervision for safety.  Impact on safety and function Mild aspiration risk   CHL IP TREATMENT RECOMMENDATION 04/08/2015 Treatment Recommendations Therapy as outlined in treatment plan below   Prognosis 04/18/2015 Prognosis for Safe Diet Advancement Good Barriers to Reach Goals Cognitive deficits Barriers/Prognosis Comment -- CHL IP DIET RECOMMENDATION 04/18/2015 SLP Diet Recommendations Dysphagia 1 (Puree) solids;Thin liquid Liquid Administration via Cup;Straw Medication Administration Crushed with puree Compensations Slow rate;Small sips/bites;Lingual sweep for clearance of pocketing;Multiple dry swallows after each bite/sip Postural Changes Remain semi-upright after after feeds/meals (Comment);Seated upright at 90 degrees   CHL IP OTHER RECOMMENDATIONS 04/18/2015 Recommended Consults -- Oral Care Recommendations Oral care BID Other Recommendations --   CHL IP FOLLOW UP RECOMMENDATIONS 04/09/2015 Follow up Recommendations Inpatient Rehab   CHL IP FREQUENCY AND DURATION 04/08/2015 Speech Therapy Frequency (ACUTE ONLY) min 2x/week Treatment Duration 2 weeks      CHL IP ORAL PHASE 04/18/2015 Oral Phase Impaired Oral - Pudding Teaspoon -- Oral - Pudding Cup -- Oral - Honey Teaspoon -- Oral - Honey Cup NT Oral - Nectar Teaspoon -- Oral - Nectar Cup Left pocketing in lateral  sulci;Decreased bolus cohesion;Left anterior bolus loss;Premature spillage;Weak  lingual manipulation Oral - Nectar Straw -- Oral - Thin Teaspoon Left anterior bolus loss;Left pocketing in lateral sulci;Decreased bolus cohesion;Weak lingual manipulation;Premature spillage Oral - Thin Cup Premature spillage;Decreased bolus cohesion;Weak lingual manipulation;Left anterior bolus loss;Left pocketing in lateral sulci Oral - Thin Straw Weak lingual manipulation;Left anterior bolus loss;Left pocketing in lateral sulci;Decreased bolus cohesion;Premature spillage Oral - Puree Weak lingual manipulation;Left pocketing in lateral sulci;Decreased bolus cohesion;Delayed oral transit Oral - Mech Soft Impaired mastication;Weak lingual manipulation;Decreased bolus cohesion;Delayed oral transit;Left pocketing in lateral sulci Oral - Regular -- Oral - Multi-Consistency -- Oral - Pill -- Oral Phase - Comment --  CHL IP PHARYNGEAL PHASE 04/18/2015 Pharyngeal Phase Impaired Pharyngeal- Pudding Teaspoon -- Pharyngeal -- Pharyngeal- Pudding Cup -- Pharyngeal -- Pharyngeal- Honey Teaspoon -- Pharyngeal -- Pharyngeal- Honey Cup NT Pharyngeal -- Pharyngeal- Nectar Teaspoon -- Pharyngeal -- Pharyngeal- Nectar Cup Delayed swallow initiation-vallecula;Reduced tongue base retraction;Pharyngeal residue - valleculae;Reduced anterior laryngeal mobility;Reduced laryngeal elevation Pharyngeal -- Pharyngeal- Nectar Straw -- Pharyngeal -- Pharyngeal- Thin Teaspoon Delayed swallow initiation-vallecula;Reduced tongue base retraction;Pharyngeal residue - valleculae;Pharyngeal residue - pyriform;Reduced laryngeal elevation;Reduced anterior laryngeal mobility Pharyngeal -- Pharyngeal- Thin Cup Delayed swallow initiation-vallecula;Pharyngeal residue - valleculae;Pharyngeal residue - pyriform;Reduced tongue base retraction;Reduced anterior laryngeal mobility;Reduced laryngeal elevation Pharyngeal -- Pharyngeal- Thin Straw Delayed swallow initiation-pyriform  sinuses;Reduced laryngeal elevation;Reduced tongue base retraction;Reduced anterior laryngeal mobility;Pharyngeal residue - valleculae;Pharyngeal residue - pyriform Pharyngeal -- Pharyngeal- Puree Delayed swallow initiation-vallecula;Reduced laryngeal elevation;Reduced anterior laryngeal mobility;Reduced tongue base retraction Pharyngeal -- Pharyngeal- Mechanical Soft Delayed swallow initiation-vallecula;Reduced tongue base retraction;Reduced laryngeal elevation;Reduced anterior laryngeal mobility Pharyngeal -- Pharyngeal- Regular -- Pharyngeal -- Pharyngeal- Multi-consistency -- Pharyngeal -- Pharyngeal- Pill -- Pharyngeal -- Pharyngeal Comment --  CHL IP CERVICAL ESOPHAGEAL PHASE 04/18/2015 Cervical Esophageal Phase Impaired Pudding Teaspoon -- Pudding Cup -- Honey Teaspoon -- Honey Cup -- Nectar Teaspoon -- Nectar Cup -- Nectar Straw -- Thin Teaspoon -- Thin Cup -- Thin Straw -- Puree -- Mechanical Soft -- Regular -- Multi-consistency -- Pill -- Cervical Esophageal Comment prominent CP  PAYNE, COURTNEY 04/18/2015, 12:39 PM              No results for input(s): WBC, HGB, HCT, PLT in the last 72 hours. No results for input(s): NA, K, CL, GLUCOSE, BUN, CREATININE, CALCIUM in the last 72 hours.  Invalid input(s): CO CBG (last 3)  No results for input(s): GLUCAP in the last 72 hours.  Wt Readings from Last 3 Encounters:  04/23/15 68.539 kg (151 lb 1.6 oz)  04/04/15 71.1 kg (156 lb 12 oz)  02/20/15 74.844 kg (165 lb)    Physical Exam:  BP 141/78 mmHg  Pulse 60  Temp(Src) 98.3 F (36.8 C) (Oral)  Resp 17  Ht  (1.651 m)  Wt 68.539 kg (151 lb 1.6 oz)  BMI 25.14 kg/m2  SpO2 98% Constitutional: He appears well-developed and well-nourished. Vitals reviewed. HENT: Normocephalic and atraumatic.  Eyes: Conjunctivae and EOM are normal.  Neck: Normal range of motion. Neck supple.  Cardiovascular: Normal rate and regular rhythm.  Respiratory: Effort normal and breath sounds normal. No  respiratory distress. He has no wheezes.  GI: Soft. Bowel sounds are normal. He exhibits no distension. There is no tenderness.  Musculoskeletal: He exhibits no edema. TTP over posterior neck.  Neurological: He is alert and oriented. Coordination abnormal.  Left facial weakness with mild dysarthria. left inattention, minimal food pocketing Able to follow commands.  RUE 4/5 delt, 4/5 Biceps due to pain 5/5 triceps and grip 1 to 1+/5 delt, pec, bicep,  tricep.  tr wrist/finger flexion 3-4/5 L KE, 3- to 3/5 L HF and 3 ADF---exam unchanged 5/5 in RLE Sensation intact BUE and BLE  Skin: Skin is warm and dry.  Psych: affect very flat  Assessment/Plan: 1. Functional deficits secondary to Right frontal hypertensive ICH which require 3+ hours per day of interdisciplinary therapy in a comprehensive inpatient rehab setting. Physiatrist is providing close team supervision and 24 hour management of active medical problems listed below. Physiatrist and rehab team continue to assess barriers to discharge/monitor patient progress toward functional and medical goals.  Function:  Bathing Bathing position   Position: Standing at sink  Bathing parts Body parts bathed by patient: Left arm, Chest, Front perineal area, Abdomen, Right upper leg, Left upper leg, Buttocks, Right lower leg, Left lower leg, Right arm, Back Body parts bathed by helper: Right arm, Back  Bathing assist Assist Level: Supervision or verbal cues      Upper Body Dressing/Undressing Upper body dressing   What is the patient wearing?: Pull over shirt/dress     Pull over shirt/dress - Perfomed by patient: Thread/unthread right sleeve, Put head through opening, Pull shirt over trunk, Thread/unthread left sleeve Pull over shirt/dress - Perfomed by helper: Thread/unthread left sleeve        Upper body assist Assist Level: Supervision or verbal cues      Lower Body Dressing/Undressing Lower body dressing   What is the patient  wearing?: Underwear, Pants, Socks, Shoes Underwear - Performed by patient: Thread/unthread right underwear leg, Pull underwear up/down, Thread/unthread left underwear leg Underwear - Performed by helper: Thread/unthread left underwear leg Pants- Performed by patient: Thread/unthread right pants leg, Thread/unthread left pants leg, Pull pants up/down Pants- Performed by helper: Pull pants up/down, Fasten/unfasten pants Non-skid slipper socks- Performed by patient: Don/doff left sock, Don/doff right sock Non-skid slipper socks- Performed by helper: Don/doff right sock, Don/doff left sock Socks - Performed by patient: Don/doff right sock, Don/doff left sock Socks - Performed by helper: Don/doff left sock Shoes - Performed by patient: Fasten left, Fasten right, Don/doff left shoe, Don/doff right shoe Shoes - Performed by helper: Fasten right, Fasten left          Lower body assist Assist for lower body dressing: Supervision or verbal cues      Toileting Toileting   Toileting steps completed by patient: Performs perineal hygiene, Adjust clothing after toileting, Adjust clothing prior to toileting Toileting steps completed by helper: Adjust clothing prior to toileting Toileting Assistive Devices: Grab bar or rail  Toileting assist Assist level: Supervision or verbal cues   Transfers Chair/bed transfer   Chair/bed transfer method: Ambulatory, Stand pivot Chair/bed transfer assist level: Supervision or verbal cues Chair/bed transfer assistive device: Armrests     Locomotion Ambulation     Max distance: 150 Assist level: Supervision or verbal cues   Wheelchair       Assist Level: Dependent (Pt equals 0%)  Cognition Comprehension Comprehension assist level: Understands basic 75 - 89% of the time/ requires cueing 10 - 24% of the time  Expression Expression assist level: Expresses basic 90% of the time/requires cueing < 10% of the time.  Social Interaction Social Interaction assist  level: Interacts appropriately 75 - 89% of the time - Needs redirection for appropriate language or to initiate interaction.  Problem Solving Problem solving assist level: Solves basic 50 - 74% of the time/requires cueing 25 - 49% of the time  Memory Memory assist level: Recognizes or recalls 50 - 74% of the  time/requires cueing 25 - 49% of the time    Medical Problem List and Plan: 1. Left hemiparesis, severe impairment RUE, moderate LLE gait disorder and dysphargia secondary to Right frontal hypertensive ICH on 12/23  -finalizing dc planning for Tomorrow 2. DVT Prophylaxis/Anticoagulation: Pharmaceutical: Heparin, prophyllactic dose, local care for RUE DVT--no changes 3. Pain Management: Tylenol prn for pain.  4. Mood: LCSW to follow for evaluation and support.  5. Neuropsych: This patient is capable of making decisions on his own behalf. 6. Skin/Wound Care: Routine pressure relief measures 7. Fluids/Electrolytes/Nutrition: Monitor I/O. Offer supplements between meals.   -  8. Malignant HTN: improved control  Continue cozaar  Coreg increased to 25mg  on 12/31  -probably will need further adjustment as outpt   9. CAD: Monitor for symptoms with increase in activity level. Continue coreg and cozaar. ASA/Plavix discontinued.  10. ABLA: Monitor for now.   Hb 11.6 on 12/30  11.  Hx hyperlipidemia, check lipid panel, currently off  12.  Dyphagia D2, thin-     -taking his time, clearing mouth/palate before next bite  LOS (Days) 14 A FACE TO FACE EVALUATION WAS PERFORMED  SWARTZ,ZACHARY T 04/24/2015 9:40 AM

## 2015-04-24 NOTE — Progress Notes (Signed)
Physical Therapy Discharge Summary  Patient Details  Name: Randall Patel MRN: [id number] Date of Birth: 1950/12/29  Today's Date: 04/24/2015 PT Individual Time: 1300-1415 PT Individual Time Calculation (min): 75 min  Session focused on completing grad day activities (see Function Tab and d/c summary below) to prepare for discharge including basic transfers, toilet transfers, simulated car transfer, bed mobility in ADL apartment, stair negotiation, readministered Berg Balance Test, functional dynamic standing and sitting balance tasks, and gait over even and uneven surfaces to simulate home and community mobility. Pt overall supervision with mobility and requires verbal cues for attention to LUE/LLE and overall awareness. Nustep for neuro re-ed for reciprocal movement pattern re-training of BLE as well as UE motor activation with L hand in adaptive support x 10 min on level 5 (6 min) and level 3 (4 min). Pt denies any concerns in regards to d/c planned for tomorrow and reports family education was successful yesterday with his sister.    Patient has met 8 of 8 long term goals due to improved activity tolerance, improved balance, improved postural control, increased strength, ability to compensate for deficits, functional use of  left lower extremity, improved attention, improved awareness and improved coordination.  Patient to discharge at an ambulatory level Supervision.   Patient's care partner is independent to provide the necessary cognitive and supervision assistance at discharge.  Reasons goals not met: n/a - all goals met at this time  Recommendation:  Patient will benefit from ongoing skilled PT services in home health setting to continue to advance safe functional mobility, address ongoing impairments in hemiparesis, strength, balance, endurance, cognition, safety awareness, attention, functional mobility, and minimize fall risk.  Equipment: No equipment provided  Reasons for discharge:  treatment goals met and discharge from hospital  Patient/family agrees with progress made and goals achieved: Yes  PT Discharge Precautions/Restrictions Precautions Precautions: Fall Precaution Comments: L hemiparesis, L inattention Restrictions Weight Bearing Restrictions: No  Pain  Denies pain. Vision/Perception  Perception Perception: Impaired Inattention/Neglect: Does not attend to left visual field;Does not attend to left side of body Comments: L inattention; able to attend with verbal cues  Cognition Overall Cognitive Status: Impaired/Different from baseline Arousal/Alertness: Awake/alert Awareness: Impaired Problem Solving: Impaired Initiating: Impaired Initiating Impairment: Verbal basic;Functional basic Safety/Judgment: Impaired Comments: left inattention, poor awareness of deficits  Sensation Sensation Light Touch: Appears Intact (BLE) Proprioception: Impaired Detail Proprioception Impaired Details: Impaired LUE (decreased awareness to LLE) Additional Comments: decreased attention and awareness to LUE and LLE Coordination Gross Motor Movements are Fluid and Coordinated: No Motor  Motor Motor: Hemiplegia;Abnormal tone     Trunk/Postural Assessment  Cervical Assessment Cervical Assessment: Exceptions to [facility] (R rotation bias; improved since admission. Corrects with cue) Thoracic Assessment Thoracic Assessment: Within Functional Limits Lumbar Assessment Lumbar Assessment: Within Functional Limits (slight posterior pelvic tilt)  Balance Balance Balance Assessed: Yes Berg Balance Test Sit to Stand: Able to stand  independently using hands Standing Unsupported: Able to stand 2 minutes with supervision Sitting with Back Unsupported but Feet Supported on Floor or Stool: Able to sit safely and securely 2 minutes Stand to Sit: Controls descent by using hands Transfers: Able to transfer safely, definite need of hands Standing Unsupported with Eyes Closed: Able to  stand 10 seconds with supervision Standing Ubsupported with Feet Together: Able to place feet together independently and stand for 1 minute with supervision From Standing, Reach Forward with Outstretched Arm: Can reach forward >12 cm safely (5") From Standing Position, Pick up Object from Floor: Able to  pick up shoe, needs supervision From Standing Position, Turn to Look Behind Over each Shoulder: Looks behind from both sides and weight shifts well Turn 360 Degrees: Needs close supervision or verbal cueing Standing Unsupported, Alternately Place Feet on Step/Stool: Able to complete >2 steps/needs minimal assist Standing Unsupported, One Foot in Front: Loses balance while stepping or standing Standing on One Leg: Tries to lift leg/unable to hold 3 seconds but remains standing independently Total Score: 35 Static Sitting Balance Static Sitting - Level of Assistance: 5: Stand by assistance Dynamic Sitting Balance Dynamic Sitting - Level of Assistance: 5: Stand by assistance Static Standing Balance Static Standing - Level of Assistance: 5: Stand by assistance Dynamic Standing Balance Dynamic Standing - Level of Assistance: 5: Stand by assistance Extremity Assessment   See OT d/c summary for UE details   RLE Assessment RLE Assessment: Within Functional Limits LLE Assessment LLE Assessment: Exceptions to [facility] LLE Strength LLE Overall Strength Comments: grossly 3 to 3+/5; proximal weakness noted functionally   See Function Navigator for Current Functional Status.  Canary Brim Ivory Broad, PT, DPT  04/24/2015, 4:23 PM

## 2015-04-24 NOTE — Progress Notes (Signed)
Occupational Therapy Discharge Summary  Patient Details  Name: Randall Patel MRN: [id number] Date of Birth: 06/04/1950  Today's Date: 04/24/2015 OT Individual Time: 0700-0800 OT Individual Time Calculation (min): 60 min    Patient has met 11 of 11 long term goals due to improved activity tolerance, improved balance, ability to compensate for deficits, functional use of  LEFT upper and LEFT lower extremity, improved attention, improved awareness and improved coordination.  Patient to discharge at overall Supervision level.  Patient's care partner is independent to provide the necessary physical and cognitive assistance at discharge.    Reasons goals not met: all goals met  Recommendation:  Patient will benefit from ongoing skilled OT services in home health setting to continue to advance functional skills in the area of BADL and iADL.  Equipment: BSC and TTB  Reasons for discharge: treatment goals met  Patient/family agrees with progress made and goals achieved: Yes   OT Intervention: Upon entering the room, pt supine in bed with no c/o pain this session. Pt ambulating in room without device with supervision. Min cues for safety,attention, initiation, and sequencing this session. Pt performed bathing and dressing tasks with sit <>stand at sink with supervision. Pt continues to need min cues for safety awareness of L UE and L inattention. Pt seated on EOB, to start meal and RN arriving to provide medications and provide supervision for safety. Call bell and all needed items within reach upon exiting the room.   OT Discharge Precautions/Restrictions  Precautions Precautions: Fall Precaution Comments: L hemiparesis, L inattention Restrictions Weight Bearing Restrictions: No Vital Signs Therapy Vitals Temp: 98.7 F (37.1 C) Temp Source: Oral Pulse Rate: 63 Resp: 17 BP: 131/71 mmHg Patient Position (if appropriate): Lying Oxygen Therapy SpO2: 99 % O2 Device: Not  Delivered Pain Pain Assessment Pain Assessment: No/denies pain Vision/Perception  Perception Perception: Impaired Inattention/Neglect: Does not attend to left visual field;Does not attend to left side of body Comments: L inattention, able to attend with verbal cues  Cognition Overall Cognitive Status: Impaired/Different from baseline Arousal/Alertness: Awake/alert Orientation Level: Oriented X4 Awareness: Impaired Problem Solving: Impaired Initiating: Impaired Initiating Impairment: Verbal basic;Functional basic Safety/Judgment: Impaired Comments: left inattention, poor awareness of deficits  Sensation Sensation Light Touch: Impaired Detail Light Touch Impaired Details: Impaired LUE Stereognosis: Not tested Hot/Cold: Appears Intact Proprioception: Impaired Detail Proprioception Impaired Details: Impaired LUE Additional Comments: decreased attention and awareness to LUE and LLE Coordination Gross Motor Movements are Fluid and Coordinated: No Fine Motor Movements are Fluid and Coordinated: No Motor  Motor Motor: Hemiplegia;Abnormal tone Mobility  Bed Mobility Bed Mobility: Supine to Sit Supine to Sit: 5: Supervision  Trunk/Postural Assessment  Cervical Assessment Cervical Assessment: Exceptions to [facility] Thoracic Assessment Thoracic Assessment: Within Functional Limits Lumbar Assessment Lumbar Assessment: Within Functional Limits  Balance Balance Balance Assessed: Yes Berg Balance Test Sit to Stand: Able to stand  independently using hands Standing Unsupported: Able to stand 2 minutes with supervision Sitting with Back Unsupported but Feet Supported on Floor or Stool: Able to sit safely and securely 2 minutes Stand to Sit: Controls descent by using hands Transfers: Able to transfer safely, definite need of hands Standing Unsupported with Eyes Closed: Able to stand 10 seconds with supervision Standing Ubsupported with Feet Together: Able to place feet together  independently and stand for 1 minute with supervision From Standing, Reach Forward with Outstretched Arm: Can reach forward >12 cm safely (5") From Standing Position, Pick up Object from Floor: Able to pick up shoe, needs supervision From  Standing Position, Turn to Look Behind Over each Shoulder: Looks behind from both sides and weight shifts well Turn 360 Degrees: Needs close supervision or verbal cueing Standing Unsupported, Alternately Place Feet on Step/Stool: Able to complete >2 steps/needs minimal assist Standing Unsupported, One Foot in Front: Loses balance while stepping or standing Standing on One Leg: Tries to lift leg/unable to hold 3 seconds but remains standing independently Total Score: 35 Static Sitting Balance Static Sitting - Level of Assistance: 5: Stand by assistance Dynamic Sitting Balance Dynamic Sitting - Level of Assistance: 5: Stand by assistance Static Standing Balance Static Standing - Level of Assistance: 5: Stand by assistance Dynamic Standing Balance Dynamic Standing - Level of Assistance: 5: Stand by assistance Extremity/Trunk Assessment RUE Assessment RUE Assessment: Within Functional Limits LUE Assessment LUE Assessment: Exceptions to [facility] (0/5 throughout, hypotonic)   See Function Navigator for Current Functional Status.  Randall Patel 04/24/2015, 4:44 PM

## 2015-04-24 NOTE — Progress Notes (Signed)
Speech Language Pathology Session Note & Discharge Summary  Patient Details  Name: Randall Patel MRN: [id number] Date of Birth: 1950-05-08  Today's Date: 04/24/2015 SLP Individual Time: 7482-7078 SLP Individual Time Calculation (min): 57 min   Skilled Therapeutic Interventions:  Skilled treatment session focused on cognitive goals. Patient was administered the MoCA and scored 17/30 points with a score of 26 or above considered normal. Patient demonstrated impairments in the areas of attention to left field of environment, selective attention, immediate and short-term recall. Patient demonstrated emergent awareness of difficulty of tasks and verbalized the importance of him continuing rehab services to continue to progress.  Patient left supine in bed with all needs within reach and alarm on. Continue with current plan of care.   Patient has met 6 of 6 long term goals.  Patient to discharge at overall Supervision;Mod level.   Reasons goals not met: N/A   Clinical Impression/Discharge Summary: Patient has made functional gains and has met 6 of 6 LTG's this admission due to increased swallowing, cognitive and speech function. Currently, patient is consuming Dys. 2 textures with thin liquids with minimal overt s/s of aspiration and requires supervision verbal cues for use of swallowing compensatory strategies. Patient also requires overall supervision verbal cues for use of speech intelligibility strategies to achieve 90-100% intelligibility at the sentence level. Patient has made cognitive gains and requires overall Min-Mod A verbal cues to complete functional and familiar tasks in regards to selective attention, attention to left field of environment, recall, problem solving, awareness and safety with functional and familiar tasks. Patient and family education is complete and patient will discharge home with 24 hour supervision. Patient would benefit from f/u SLP services to maximize functional  independence and reduce caregiver burden.   Care Partner:  Caregiver Able to Provide Assistance: Yes  Type of Caregiver Assistance: Cognitive  Recommendation:  Home Health SLP;24 hour supervision/assistance  Rationale for SLP Follow Up: Maximize functional communication;Maximize cognitive function and independence;Maximize swallowing safety;Reduce caregiver burden   Equipment: N/A   Reasons for discharge: Treatment goals met;Discharged from hospital   Patient/Family Agrees with Progress Made and Goals Achieved: Yes   Function:  Eating Eating Eating activity did not occur: N/A Modified Consistency Diet: Yes Eating Assist Level: Supervision or verbal cues;Set up assist for   Eating Set Up Assist For: Opening containers       Cognition Comprehension Comprehension assist level: Understands basic 75 - 89% of the time/ requires cueing 10 - 24% of the time  Expression   Expression assist level: Expresses basic 90% of the time/requires cueing < 10% of the time.  Social Interaction Social Interaction assist level: Interacts appropriately 75 - 89% of the time - Needs redirection for appropriate language or to initiate interaction.  Problem Solving Problem solving assist level: Solves basic 50 - 74% of the time/requires cueing 25 - 49% of the time  Memory Memory assist level: Recognizes or recalls 50 - 74% of the time/requires cueing 25 - 49% of the time   [name] 04/24/2015, 5:05 PM

## 2015-04-24 NOTE — Plan of Care (Signed)
Problem: RH SKIN INTEGRITY Goal: RH STG SKIN FREE OF INFECTION/BREAKDOWN Skin to remain free from breakdown while on rehab With Mod. I Assist.  Outcome: Adequate for Discharge No skin brekdown noted

## 2015-04-25 MED ORDER — LOSARTAN POTASSIUM 100 MG PO TABS: 100.0000 mg | ORAL_TABLET | Freq: Every day | ORAL | Status: DC

## 2015-04-25 MED ORDER — CARVEDILOL 25 MG PO TABS: 25.0000 mg | ORAL_TABLET | Freq: Two times a day (BID) | ORAL | Status: DC

## 2015-04-25 MED ORDER — SENNOSIDES-DOCUSATE SODIUM 8.6-50 MG PO TABS: 1.0000 | ORAL_TABLET | Freq: Every day | ORAL | Status: DC

## 2015-04-25 MED ORDER — PANTOPRAZOLE SODIUM 40 MG PO TBEC: 40.0000 mg | DELAYED_RELEASE_TABLET | Freq: Every day | ORAL | Status: DC

## 2015-04-25 NOTE — Discharge Summary (Signed)
Physician Discharge Summary  Patient ID: Randall Patel MRN: [id number] DOB/AGE: [birth year] 65 y.o.  Admit date: 04/10/2015 Discharge date: 04/25/2015  Discharge Diagnoses:  Principal Problem:   Hemiparesis affecting left side as late effect of stroke [facility]) Active Problems:   Dysphagia as late effect of cerebrovascular disease   Essential hypertension   Acute blood loss anemia   Muscle stiffness   Cough   Discharged Condition: Stable.   Significant Diagnostic Studies:   Labs:  Basic Metabolic Panel: BMP Latest Ref Rng 04/15/2015 04/11/2015 04/10/2015  Glucose 65 - 99 mg/dL 93 932(I) 97  BUN 6 - 20 mg/dL 8 8 8   Creatinine 0.61 - 1.24 mg/dL 7.12 4.58 0.99  Sodium 135 - 145 mmol/L 139 143 145  Potassium 3.5 - 5.1 mmol/L 4.1 3.4(L) 3.4(L)  Chloride 101 - 111 mmol/L 107 107 113(H)  CO2 22 - 32 mmol/L 24 26 26   Calcium 8.9 - 10.3 mg/dL 9.4 9.2 9.1     CBC: CBC Latest Ref Rng 04/11/2015 04/10/2015 04/09/2015  WBC 4.0 - 10.5 K/uL 4.8 5.9 7.7  Hemoglobin 13.0 - 17.0 g/dL 11.6(L) 11.0(L) 11.3(L)  Hematocrit 39.0 - 52.0 % 36.1(L) 34.9(L) 36.4(L)  Platelets 150 - 400 K/uL 169 134(L) 137(L)     CBG:  Recent Labs Lab 04/19/15 1130 04/21/15 0653  GLUCAP 84 84    Brief HPI:   Randall Patel is a 65 year old male with history of HTN, CAD with ICM, polysubstance abuse, 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 UDS positive for THC. CT head done revealing large right frontoparietal ICH and he was started on cardene drip and IV mannitol. Chronic ASA/plavix discontinued and Dr. Yetta Barre recommended conservative management with serial CCT.  Repeat CT head with increase in midline shift and mass effect and mannitol changed to hypertonic saline with goal 150-155.  Dr. Roda Shutters feels that patient had R-ICH with cerebral edema in setting of HTN and to consider outpatient 30 day cardiac event  monitor to rule out embolic source. Mentation was improving and patient with resultant LUE > LLE weakness, balance deficits, left inattention, poor safety awareness and dysphagia.  CIR was recommended for follow up therapy and patient cleared medically today.    Hospital Course: Randall Patel was admitted to rehab 04/10/2015 for inpatient therapies to consist of PT, ST and OT at least three hours five days a week. Past admission physiatrist, therapy team and rehab RN have worked together to provide customized collaborative inpatient rehab. Follow up labs showed mild hypokalemia which has resolved with supplementation. hypernateremia has resolved with improvement in intake. CBC shows H/H is stable at 11.6.  Blood pressures were monitored on bid and coreg was increased to help improve control. He is to follow up with primary MD for futher titration of medication.  Po intake has improved and he was advanced to dysphagia 2 diet.  He has been tolerating this without s/s of aspiration.  Mood has been stable and he has made steady progress to supervision level.  He will continue to receive follow up HHPT, HHOT and HHST by [facility] after discharge.    Rehab course: During patient's stay in rehab weekly team conferences were held to monitor patient's progress, set goals and discuss barriers to discharge. Patient has had improvement in activity tolerance, balance, postural control, as well as ability to compensate for deficits. He is has had improvement in  functional use RUE  and RLE as well as improvement in awareness. He is able to complete ADL tasks with supervision. He is able to ambulate 150 feet without AD and supervision for safety.  He requires supervision to use speech intelligibility as well as safe swallow strategies.  He is able to complete familiar tasks with min to moderate cues for attention to left field, safety and recall. Family education was completed with sister regarding assistance  needed with cognitive tasks as well as supervision for safety.     Disposition:  Home   Diet: Cardiac diet.   Special Instructions: 1. No alcohol or Marijuana.  2. No driving.    Discharge Instructions    Ambulatory referral to Physical Medicine Rehab    Complete by:  As directed   Need 4 week follow up after hemorrhagic stroke            Medication List    STOP taking these medications        aspirin 81 MG chewable tablet     clopidogrel 75 MG tablet  Commonly known as:  PLAVIX     furosemide 20 MG tablet  Commonly known as:  LASIX     meloxicam 7.5 MG tablet  Commonly known as:  MOBIC      TAKE these medications        atorvastatin 80 MG tablet  Commonly known as:  LIPITOR  Take 1 tablet (80 mg total) by mouth daily with breakfast.     carvedilol 25 MG tablet  Commonly known as:  COREG  Take 1 tablet (25 mg total) by mouth 2 (two) times daily with a meal.     losartan 100 MG tablet  Commonly known as:  COZAAR  Take 1 tablet (100 mg total) by mouth daily.     nitroGLYCERIN 0.4 MG SL tablet  Commonly known as:  NITROSTAT  Place 1 tablet (0.4 mg total) under the tongue every 5 (five) minutes as needed for chest pain (up to 3 doses).     pantoprazole 40 MG tablet  Commonly known as:  PROTONIX  Take 1 tablet (40 mg total) by mouth daily.     senna-docusate 8.6-50 MG tablet  Commonly known as:  Senokot-S  Take 1 tablet by mouth at bedtime.           Follow-up Information    Follow up with Ranelle Oyster, MD.   Specialty:  Physical Medicine and Rehabilitation   Why:  office to call you with appointment    Contact information:   [address], [address] [place] [address] [phone]       Follow up with [facility].   Why:  office to contact you to place cardiac monitor.    Contact information:   [address] [place] [address]       Follow up with Xu,Jindong, MD. Call today.   Specialty:   Neurology   Why:  for follow up appointment in 4 weeks.    Contact information:   [address] [place] [address] [phone]       Follow up with [name], MD.   Specialty:  Family Medicine   Contact information:   [address] [address] [place] [address] [phone]       Signed: Jacquelynn Cree 04/25/2015, 9:52 AM

## 2015-04-25 NOTE — Progress Notes (Signed)
Patient and family received discharge instructions from Marissa Nestle, PA-C with verbal understanding. Patient discharged to home with family and belongings. Patient received Flu vaccine on 04/11/15.

## 2015-04-25 NOTE — Progress Notes (Signed)
Social Work  Discharge Note  The overall goal for the admission was met for:   Discharge location: Yes - home with sister and other family who can provide 24/7 supervision  Length of Stay: Yes - 15 days  Discharge activity level: Yes - supervision  Home/community participation: Yes  Services provided included: MD, RD, PT, OT, SLP, RN, TR, Pharmacy, [place]: Medicare and Medicaid  Follow-up services arranged: Home Health: PT, OT, ST via [place], DME: 3n1 commode and tub bench via [place] and Patient/Family has no preference for HH/DME agencies  Comments (or additional information):  Patient/Family verbalized understanding of follow-up arrangements: Yes  Individual responsible for coordination of the follow-up plan: pt  Confirmed correct DME delivered: Randall Patel 04/25/2015    Arley Garant

## 2015-04-25 NOTE — Discharge Instructions (Signed)
Inpatient Rehab Discharge Instructions  Randall Patel Discharge date and time:  04/24/14  Activities/Precautions/ Functional Status: Activity: activity as tolerated Diet: cardiac diet Wound Care: none needed Functional status:  ___ No restrictions     ___ Walk up steps independently _X__ 24/7 supervision/assistance   ___ Walk up steps with assistance ___ Intermittent supervision/assistance  ___ Bathe/dress independently ___ Walk with walker     ___ Bathe/dress with assistance ___ Walk Independently    ___ Shower independently ___ Walk with assistance    ___ Shower with assistance _X__ No alcohol/No marijuana    ___ Return to work/school ________    COMMUNITY REFERRALS UPON DISCHARGE:    Home Health:   PT     OT     ST                      Agency: [facility] Phone: [phone]   Medical Equipment/Items Ordered:  Commode and tub bench                                                       [facility]Supplier:  [facility] @ [phone]       Special Instructions: 1. Further medications adjustment and refills to be done by primary care physician.    STROKE/TIA DISCHARGE INSTRUCTIONS SMOKING Cigarette smoking nearly doubles your risk of having a stroke & is the single most alterable risk factor  If you smoke or have smoked in the last 12 months, you are advised to quit smoking for your health.  Most of the excess cardiovascular risk related to smoking disappears within a year of stopping.  Ask you doctor about anti-smoking medications  [place] Quit Line: 1-800-QUIT NOW  Free Smoking Cessation Classes [phone]  CHOLESTEROL Know your levels; limit fat & cholesterol in your diet  Lipid Panel     Component Value Date/Time   CHOL 179 04/07/2015 0203   TRIG 67 04/07/2015 0203   HDL 96 04/07/2015 0203   CHOLHDL 1.9 04/07/2015 0203   VLDL 13 04/07/2015 0203   LDLCALC 70 04/07/2015 0203      Many patients benefit from treatment even if their cholesterol is at  goal.  Goal: Total Cholesterol (CHOL) less than 160  Goal:  Triglycerides (TRIG) less than 150  Goal:  HDL greater than 40  Goal:  LDL (LDLCALC) less than 100   BLOOD PRESSURE American Stroke Association blood pressure target is less that 120/80 mm/Hg  Your discharge blood pressure is:  BP: (!) 125/92 mmHg  Monitor your blood pressure  Limit your salt and alcohol intake  Many individuals will require more than one medication for high blood pressure  DIABETES (A1c is a blood sugar average for last 3 months) Goal HGBA1c is under 7% (HBGA1c is blood sugar average for last 3 months)  Diabetes: No known diagnosis of diabetes    Lab Results  Component Value Date   HGBA1C 6.1* 04/07/2015     Your HGBA1c can be lowered with medications, healthy diet, and exercise.  Check your blood sugar as directed by your physician  Call your physician if you experience unexplained or low blood sugars.  PHYSICAL ACTIVITY/REHABILITATION Goal is 30 minutes at least 4 days per week  Activity: No driving, Therapies: See above Return to work:  n/a  Activity decreases your  risk of heart attack and stroke and makes your heart stronger.  It helps control your weight and blood pressure; helps you relax and can improve your mood.  Participate in a regular exercise program.  Talk with your doctor about the best form of exercise for you (dancing, walking, swimming, cycling).  DIET/WEIGHT Goal is to maintain a healthy weight  Your discharge diet is: DIET - DYS 1 Room service appropriate?: Yes; Fluid consistency:: Thin liquids Your height is:  Height:  (165.1 cm) Your current weight is: Weight: 75.3 kg (166 lb 0.1 oz) Your Body Mass Index (BMI) is:  BMI (Calculated): 27.1  Following the type of diet specifically designed for you will help prevent another stroke.  Your goal weight is:  150 lbs  Your goal Body Mass Index (BMI) is 19-24.  Healthy food habits can help reduce 3 risk factors for stroke:   High cholesterol, hypertension, and excess weight.  RESOURCES Stroke/Support Group:  Call [phone]   STROKE EDUCATION PROVIDED/REVIEWED AND GIVEN TO PATIENT Stroke warning signs and symptoms How to activate emergency medical system (call 911). Medications prescribed at discharge. Need for follow-up after discharge. Personal risk factors for stroke. Pneumonia vaccine given:  Flu vaccine given:  My questions have been answered, the writing is legible, and I understand these instructions.  I will adhere to these goals & educational materials that have been provided to me after my discharge from the hospital.      My questions have been answered and I understand these instructions. I will adhere to these goals and the provided educational materials after my discharge from the hospital.  Patient/Caregiver Signature _______________________________ Date __________  Clinician Signature _______________________________________ Date __________  Please bring this form and your medication list with you to all your follow-up doctor's appointments.

## 2015-04-25 NOTE — Progress Notes (Signed)
[place] PHYSICAL MEDICINE & REHABILITATION     PROGRESS NOTE    Subjective/Complaints:  Up at sink brushing teeth. Ready to go home!!!   ROS: Denies CP, SOB, n/v/d, fevers, chills. No anxiety, visual issues.   Objective: Vital Signs: Blood pressure 147/80, pulse 57, temperature 98.5 F (36.9 C), temperature source Oral, resp. rate 16, height 5\' 5"  (1.651 m), weight 68.539 kg (151 lb 1.6 oz), SpO2 98 %. Dg Swallowing Func-speech Pathology  04/18/2015  Objective Swallowing Evaluation:   Patient Details Name: Randall Patel MRN: [id number] Date of Birth: April 18, 1950 Today's Date: 04/18/2015 Time: SLP Start Time (ACUTE ONLY): 0900-SLP Stop Time (ACUTE ONLY): 0930 SLP Time Calculation (min) (ACUTE ONLY): 30 min Past Surgical History: Past Surgical History Procedure Laterality Date . Hernia repair   . Left heart catheterization with coronary angiogram N/A 04/11/2012   Procedure: LEFT HEART CATHETERIZATION WITH CORONARY ANGIOGRAM;  Surgeon: Kathleene Hazel, MD;  Location: [facility] CATH LAB;  Service: Cardiovascular;  Laterality: N/A; . Percutaneous coronary stent intervention (pci-s)  04/11/2012   Procedure: PERCUTANEOUS CORONARY STENT INTERVENTION (PCI-S);  Surgeon: Kathleene Hazel, MD;  Location: [facility] CATH LAB;  Service: Cardiovascular;;  Distal RCA . Percutaneous coronary intervention-balloon only  04/11/2012   Procedure: PERCUTANEOUS CORONARY INTERVENTION-BALLOON ONLY;  Surgeon: Kathleene Hazel, MD;  Location: [facility] CATH LAB;  Service: Cardiovascular;;  RPLA HPI: Randall Patel is an 65 y.o. male with a hx of stroke and CAD s/p stenting presents with acute onset of L sided weakness and found to have Large R ICH on CT. PMH: HTN, CAD, ETOH abuse, tobacco abuse. No Data Recorded Assessment / Plan / Recommendation CHL IP CLINICAL IMPRESSIONS 04/18/2015 Therapy Diagnosis Moderate oral phase dysphagia;Mild pharyngeal phase dysphagia Clinical Impression Pt presents with a moderate oral and mild pharyngeal  dysphagia characterized by impaired oral control with premature spillage into pharynx and residue throughout left lateral sulcus with all liquids and poor bolus manipulation and mastication with soft solids. Patient also demonstrates reduced mobility of the hyolaryngeal complex leading to mild vallecular and pyriform sinus residue that cleared with spontaneous multiple swallows. Patient with one episode of flash penetration on initial sip of thin liquids, however, no penetration or aspiration observed with further trials of thin liquids via cup or straw. Recommend patient continue Dys. 1 textures and upgrade to thin liquids with full supervision for safety.  Impact on safety and function Mild aspiration risk   CHL IP TREATMENT RECOMMENDATION 04/08/2015 Treatment Recommendations Therapy as outlined in treatment plan below   Prognosis 04/18/2015 Prognosis for Safe Diet Advancement Good Barriers to Reach Goals Cognitive deficits Barriers/Prognosis Comment -- CHL IP DIET RECOMMENDATION 04/18/2015 SLP Diet Recommendations Dysphagia 1 (Puree) solids;Thin liquid Liquid Administration via Cup;Straw Medication Administration Crushed with puree Compensations Slow rate;Small sips/bites;Lingual sweep for clearance of pocketing;Multiple dry swallows after each bite/sip Postural Changes Remain semi-upright after after feeds/meals (Comment);Seated upright at 90 degrees   CHL IP OTHER RECOMMENDATIONS 04/18/2015 Recommended Consults -- Oral Care Recommendations Oral care BID Other Recommendations --   CHL IP FOLLOW UP RECOMMENDATIONS 04/09/2015 Follow up Recommendations Inpatient Rehab   CHL IP FREQUENCY AND DURATION 04/08/2015 Speech Therapy Frequency (ACUTE ONLY) min 2x/week Treatment Duration 2 weeks      CHL IP ORAL PHASE 04/18/2015 Oral Phase Impaired Oral - Pudding Teaspoon -- Oral - Pudding Cup -- Oral - Honey Teaspoon -- Oral - Honey Cup NT Oral - Nectar Teaspoon -- Oral - Nectar Cup Left pocketing in lateral sulci;Decreased bolus  cohesion;Left anterior bolus  loss;Premature spillage;Weak lingual manipulation Oral - Nectar Straw -- Oral - Thin Teaspoon Left anterior bolus loss;Left pocketing in lateral sulci;Decreased bolus cohesion;Weak lingual manipulation;Premature spillage Oral - Thin Cup Premature spillage;Decreased bolus cohesion;Weak lingual manipulation;Left anterior bolus loss;Left pocketing in lateral sulci Oral - Thin Straw Weak lingual manipulation;Left anterior bolus loss;Left pocketing in lateral sulci;Decreased bolus cohesion;Premature spillage Oral - Puree Weak lingual manipulation;Left pocketing in lateral sulci;Decreased bolus cohesion;Delayed oral transit Oral - Mech Soft Impaired mastication;Weak lingual manipulation;Decreased bolus cohesion;Delayed oral transit;Left pocketing in lateral sulci Oral - Regular -- Oral - Multi-Consistency -- Oral - Pill -- Oral Phase - Comment --  CHL IP PHARYNGEAL PHASE 04/18/2015 Pharyngeal Phase Impaired Pharyngeal- Pudding Teaspoon -- Pharyngeal -- Pharyngeal- Pudding Cup -- Pharyngeal -- Pharyngeal- Honey Teaspoon -- Pharyngeal -- Pharyngeal- Honey Cup NT Pharyngeal -- Pharyngeal- Nectar Teaspoon -- Pharyngeal -- Pharyngeal- Nectar Cup Delayed swallow initiation-vallecula;Reduced tongue base retraction;Pharyngeal residue - valleculae;Reduced anterior laryngeal mobility;Reduced laryngeal elevation Pharyngeal -- Pharyngeal- Nectar Straw -- Pharyngeal -- Pharyngeal- Thin Teaspoon Delayed swallow initiation-vallecula;Reduced tongue base retraction;Pharyngeal residue - valleculae;Pharyngeal residue - pyriform;Reduced laryngeal elevation;Reduced anterior laryngeal mobility Pharyngeal -- Pharyngeal- Thin Cup Delayed swallow initiation-vallecula;Pharyngeal residue - valleculae;Pharyngeal residue - pyriform;Reduced tongue base retraction;Reduced anterior laryngeal mobility;Reduced laryngeal elevation Pharyngeal -- Pharyngeal- Thin Straw Delayed swallow initiation-pyriform sinuses;Reduced laryngeal  elevation;Reduced tongue base retraction;Reduced anterior laryngeal mobility;Pharyngeal residue - valleculae;Pharyngeal residue - pyriform Pharyngeal -- Pharyngeal- Puree Delayed swallow initiation-vallecula;Reduced laryngeal elevation;Reduced anterior laryngeal mobility;Reduced tongue base retraction Pharyngeal -- Pharyngeal- Mechanical Soft Delayed swallow initiation-vallecula;Reduced tongue base retraction;Reduced laryngeal elevation;Reduced anterior laryngeal mobility Pharyngeal -- Pharyngeal- Regular -- Pharyngeal -- Pharyngeal- Multi-consistency -- Pharyngeal -- Pharyngeal- Pill -- Pharyngeal -- Pharyngeal Comment --  CHL IP CERVICAL ESOPHAGEAL PHASE 04/18/2015 Cervical Esophageal Phase Impaired Pudding Teaspoon -- Pudding Cup -- Honey Teaspoon -- Honey Cup -- Nectar Teaspoon -- Nectar Cup -- Nectar Straw -- Thin Teaspoon -- Thin Cup -- Thin Straw -- Puree -- Mechanical Soft -- Regular -- Multi-consistency -- Pill -- Cervical Esophageal Comment prominent CP  PAYNE, COURTNEY 04/18/2015, 12:39 PM              No results for input(s): WBC, HGB, HCT, PLT in the last 72 hours. No results for input(s): NA, K, CL, GLUCOSE, BUN, CREATININE, CALCIUM in the last 72 hours.  Invalid input(s): CO CBG (last 3)  No results for input(s): GLUCAP in the last 72 hours.  Wt Readings from Last 3 Encounters:  04/23/15 68.539 kg (151 lb 1.6 oz)  04/04/15 71.1 kg (156 lb 12 oz)  02/20/15 74.844 kg (165 lb)    Physical Exam:  BP 147/80 mmHg  Pulse 57  Temp(Src) 98.5 F (36.9 C) (Oral)  Resp 16  Ht  (1.651 m)  Wt 68.539 kg (151 lb 1.6 oz)  BMI 25.14 kg/m2  SpO2 98% Constitutional: He appears well-developed and well-nourished. Vitals reviewed. HENT: Normocephalic and atraumatic.  Eyes: Conjunctivae and EOM are normal.  Neck: Normal range of motion. Neck supple.  Cardiovascular: Normal rate and regular rhythm.  Respiratory: Effort normal and breath sounds normal. No respiratory distress. He has no  wheezes.  GI: Soft. Bowel sounds are normal. He exhibits no distension. There is no tenderness.  Musculoskeletal: He exhibits no edema. TTP over posterior neck.  Neurological: He is alert and oriented. Coordination abnormal.  Left facial weakness with mild dysarthria. left inattention, minimal food pocketing Able to follow commands.  RUE 4/5 delt, 4/5 Biceps due to pain 5/5 triceps and grip 1 to 1+/5 delt,  pec, bicep, tricep.  tr wrist/finger flexion 3-4/5 L KE, 3- to 3/5 L HF and 3 ADF---exam unchanged 5/5 in RLE Sensation intact BUE and BLE  Skin: Skin is warm and dry.  Psych: affect very flat  Assessment/Plan: 1. Functional deficits secondary to Right frontal hypertensive ICH which require 3+ hours per day of interdisciplinary therapy in a comprehensive inpatient rehab setting. Physiatrist is providing close team supervision and 24 hour management of active medical problems listed below. Physiatrist and rehab team continue to assess barriers to discharge/monitor patient progress toward functional and medical goals.  Function:  Bathing Bathing position   Position: Standing at sink  Bathing parts Body parts bathed by patient: Left arm, Chest, Front perineal area, Abdomen, Right upper leg, Left upper leg, Buttocks, Right lower leg, Left lower leg, Right arm, Back Body parts bathed by helper: Right arm, Back  Bathing assist Assist Level: Supervision or verbal cues      Upper Body Dressing/Undressing Upper body dressing   What is the patient wearing?: Pull over shirt/dress     Pull over shirt/dress - Perfomed by patient: Thread/unthread right sleeve, Put head through opening, Pull shirt over trunk, Thread/unthread left sleeve Pull over shirt/dress - Perfomed by helper: Thread/unthread left sleeve        Upper body assist Assist Level: Supervision or verbal cues      Lower Body Dressing/Undressing Lower body dressing   What is the patient wearing?: Underwear, Pants,  Socks, Shoes Underwear - Performed by patient: Thread/unthread right underwear leg, Pull underwear up/down, Thread/unthread left underwear leg Underwear - Performed by helper: Thread/unthread left underwear leg Pants- Performed by patient: Thread/unthread right pants leg, Thread/unthread left pants leg, Pull pants up/down Pants- Performed by helper: Pull pants up/down, Fasten/unfasten pants Non-skid slipper socks- Performed by patient: Don/doff left sock, Don/doff right sock Non-skid slipper socks- Performed by helper: Don/doff right sock, Don/doff left sock Socks - Performed by patient: Don/doff right sock, Don/doff left sock Socks - Performed by helper: Don/doff left sock Shoes - Performed by patient: Fasten left, Fasten right, Don/doff left shoe, Don/doff right shoe Shoes - Performed by helper: Fasten right, Fasten left          Lower body assist Assist for lower body dressing: Supervision or verbal cues      Toileting Toileting   Toileting steps completed by patient: Performs perineal hygiene, Adjust clothing after toileting, Adjust clothing prior to toileting Toileting steps completed by helper: Adjust clothing prior to toileting Toileting Assistive Devices: Grab bar or rail  Toileting assist Assist level: Supervision or verbal cues   Transfers Chair/bed transfer   Chair/bed transfer method: Ambulatory, Stand pivot Chair/bed transfer assist level: Supervision or verbal cues Chair/bed transfer assistive device: Armrests     Locomotion Ambulation     Max distance: 150 Assist level: Supervision or verbal cues   Wheelchair Wheelchair activity did not occur:  (gait to/from therapy)     Assist Level: Dependent (Pt equals 0%)  Cognition Comprehension Comprehension assist level: Understands basic 75 - 89% of the time/ requires cueing 10 - 24% of the time  Expression Expression assist level: Expresses basic 90% of the time/requires cueing < 10% of the time.  Social  Interaction Social Interaction assist level: Interacts appropriately 75 - 89% of the time - Needs redirection for appropriate language or to initiate interaction.  Problem Solving Problem solving assist level: Solves basic 50 - 74% of the time/requires cueing 25 - 49% of the time  Memory Memory assist  level: Recognizes or recalls 50 - 74% of the time/requires cueing 25 - 49% of the time    Medical Problem List and Plan: 1. Left hemiparesis, severe impairment RUE, moderate LLE gait disorder and dysphargia secondary to Right frontal hypertensive ICH on 12/23  -dc home today. Follow up therapy arranged  -follow up with me in about a month.  2. DVT Prophylaxis/Anticoagulation: Pharmaceutical: dc heparin 3. Pain Management: Tylenol prn for pain.  4. Mood: LCSW to follow for evaluation and support.  5. Neuropsych: This patient is capable of making decisions on his own behalf. 6. Skin/Wound Care: Routine pressure relief measures 7. Fluids/Electrolytes/Nutrition: Monitor I/O. Offer supplements between meals.   -  8. Malignant HTN: improved control  Continue cozaar  Coreg increased to 25mg  on 12/31  -probably will need further adjustment as outpt   9. CAD: Monitor for symptoms with increase in activity level. Continue coreg and cozaar. ASA/Plavix discontinued.  10. ABLA: Monitor for now.   Hb 11.6 on 12/30  11.  Hx hyperlipidemia, check lipid panel, currently off  12.  Dyphagia D2, thin-     -taking his time, clearing mouth/palate before next bite  LOS (Days) 15 A FACE TO FACE EVALUATION WAS PERFORMED  Cathan Gearin T 04/25/2015 9:12 AM

## 2015-04-26 DIAGNOSIS — I69391 Dysphagia following cerebral infarction: Secondary | ICD-10-CM | POA: Diagnosis not present

## 2015-04-26 DIAGNOSIS — I69352 Hemiplegia and hemiparesis following cerebral infarction affecting left dominant side: Secondary | ICD-10-CM | POA: Diagnosis not present

## 2015-04-26 DIAGNOSIS — I10 Essential (primary) hypertension: Secondary | ICD-10-CM | POA: Diagnosis not present

## 2015-04-26 DIAGNOSIS — R2689 Other abnormalities of gait and mobility: Secondary | ICD-10-CM | POA: Diagnosis not present

## 2015-04-26 DIAGNOSIS — I69398 Other sequelae of cerebral infarction: Secondary | ICD-10-CM | POA: Diagnosis not present

## 2015-04-26 DIAGNOSIS — Z87891 Personal history of nicotine dependence: Secondary | ICD-10-CM | POA: Diagnosis not present

## 2015-04-26 DIAGNOSIS — I255 Ischemic cardiomyopathy: Secondary | ICD-10-CM | POA: Diagnosis not present

## 2015-04-26 DIAGNOSIS — I2510 Atherosclerotic heart disease of native coronary artery without angina pectoris: Secondary | ICD-10-CM | POA: Diagnosis not present

## 2015-04-26 DIAGNOSIS — E785 Hyperlipidemia, unspecified: Secondary | ICD-10-CM | POA: Diagnosis not present

## 2015-04-29 DIAGNOSIS — I2510 Atherosclerotic heart disease of native coronary artery without angina pectoris: Secondary | ICD-10-CM | POA: Diagnosis not present

## 2015-04-29 DIAGNOSIS — I69352 Hemiplegia and hemiparesis following cerebral infarction affecting left dominant side: Secondary | ICD-10-CM | POA: Diagnosis not present

## 2015-04-29 DIAGNOSIS — I69398 Other sequelae of cerebral infarction: Secondary | ICD-10-CM | POA: Diagnosis not present

## 2015-04-29 DIAGNOSIS — I255 Ischemic cardiomyopathy: Secondary | ICD-10-CM | POA: Diagnosis not present

## 2015-04-29 DIAGNOSIS — R2689 Other abnormalities of gait and mobility: Secondary | ICD-10-CM | POA: Diagnosis not present

## 2015-04-29 DIAGNOSIS — I69391 Dysphagia following cerebral infarction: Secondary | ICD-10-CM | POA: Diagnosis not present

## 2015-04-30 DIAGNOSIS — I255 Ischemic cardiomyopathy: Secondary | ICD-10-CM | POA: Diagnosis not present

## 2015-04-30 DIAGNOSIS — I69391 Dysphagia following cerebral infarction: Secondary | ICD-10-CM | POA: Diagnosis not present

## 2015-04-30 DIAGNOSIS — I69352 Hemiplegia and hemiparesis following cerebral infarction affecting left dominant side: Secondary | ICD-10-CM | POA: Diagnosis not present

## 2015-04-30 DIAGNOSIS — R2689 Other abnormalities of gait and mobility: Secondary | ICD-10-CM | POA: Diagnosis not present

## 2015-04-30 DIAGNOSIS — I2510 Atherosclerotic heart disease of native coronary artery without angina pectoris: Secondary | ICD-10-CM | POA: Diagnosis not present

## 2015-04-30 DIAGNOSIS — I69398 Other sequelae of cerebral infarction: Secondary | ICD-10-CM | POA: Diagnosis not present

## 2015-05-01 DIAGNOSIS — I69398 Other sequelae of cerebral infarction: Secondary | ICD-10-CM | POA: Diagnosis not present

## 2015-05-01 DIAGNOSIS — I255 Ischemic cardiomyopathy: Secondary | ICD-10-CM | POA: Diagnosis not present

## 2015-05-01 DIAGNOSIS — I69352 Hemiplegia and hemiparesis following cerebral infarction affecting left dominant side: Secondary | ICD-10-CM | POA: Diagnosis not present

## 2015-05-01 DIAGNOSIS — I2510 Atherosclerotic heart disease of native coronary artery without angina pectoris: Secondary | ICD-10-CM | POA: Diagnosis not present

## 2015-05-01 DIAGNOSIS — R2689 Other abnormalities of gait and mobility: Secondary | ICD-10-CM | POA: Diagnosis not present

## 2015-05-01 DIAGNOSIS — I69391 Dysphagia following cerebral infarction: Secondary | ICD-10-CM | POA: Diagnosis not present

## 2015-05-05 ENCOUNTER — Telehealth: Payer: Self-pay | Admitting: *Deleted

## 2015-05-05 DIAGNOSIS — I69398 Other sequelae of cerebral infarction: Secondary | ICD-10-CM | POA: Diagnosis not present

## 2015-05-05 DIAGNOSIS — I2510 Atherosclerotic heart disease of native coronary artery without angina pectoris: Secondary | ICD-10-CM | POA: Diagnosis not present

## 2015-05-05 DIAGNOSIS — I255 Ischemic cardiomyopathy: Secondary | ICD-10-CM | POA: Diagnosis not present

## 2015-05-05 DIAGNOSIS — R2689 Other abnormalities of gait and mobility: Secondary | ICD-10-CM | POA: Diagnosis not present

## 2015-05-05 DIAGNOSIS — I69352 Hemiplegia and hemiparesis following cerebral infarction affecting left dominant side: Secondary | ICD-10-CM | POA: Diagnosis not present

## 2015-05-05 DIAGNOSIS — I69391 Dysphagia following cerebral infarction: Secondary | ICD-10-CM | POA: Diagnosis not present

## 2015-05-05 NOTE — Telephone Encounter (Signed)
Home health physical therapist called saying that there is confusion as to whether pt is supposed to be taking his atorvastatin. Patient says he was told to stop, discharge papers say otherwise.  Please advise

## 2015-05-05 NOTE — Telephone Encounter (Signed)
He should be taking his atorvastatin

## 2015-05-06 DIAGNOSIS — R2689 Other abnormalities of gait and mobility: Secondary | ICD-10-CM | POA: Diagnosis not present

## 2015-05-06 DIAGNOSIS — I255 Ischemic cardiomyopathy: Secondary | ICD-10-CM | POA: Diagnosis not present

## 2015-05-06 DIAGNOSIS — I69398 Other sequelae of cerebral infarction: Secondary | ICD-10-CM | POA: Diagnosis not present

## 2015-05-06 DIAGNOSIS — I69352 Hemiplegia and hemiparesis following cerebral infarction affecting left dominant side: Secondary | ICD-10-CM | POA: Diagnosis not present

## 2015-05-06 DIAGNOSIS — I2510 Atherosclerotic heart disease of native coronary artery without angina pectoris: Secondary | ICD-10-CM | POA: Diagnosis not present

## 2015-05-06 DIAGNOSIS — I69391 Dysphagia following cerebral infarction: Secondary | ICD-10-CM | POA: Diagnosis not present

## 2015-05-06 NOTE — Telephone Encounter (Signed)
Called Physical therapist back and informed

## 2015-05-07 DIAGNOSIS — I69398 Other sequelae of cerebral infarction: Secondary | ICD-10-CM | POA: Diagnosis not present

## 2015-05-07 DIAGNOSIS — I69391 Dysphagia following cerebral infarction: Secondary | ICD-10-CM | POA: Diagnosis not present

## 2015-05-07 DIAGNOSIS — I255 Ischemic cardiomyopathy: Secondary | ICD-10-CM | POA: Diagnosis not present

## 2015-05-07 DIAGNOSIS — I2510 Atherosclerotic heart disease of native coronary artery without angina pectoris: Secondary | ICD-10-CM | POA: Diagnosis not present

## 2015-05-07 DIAGNOSIS — R2689 Other abnormalities of gait and mobility: Secondary | ICD-10-CM | POA: Diagnosis not present

## 2015-05-07 DIAGNOSIS — I69352 Hemiplegia and hemiparesis following cerebral infarction affecting left dominant side: Secondary | ICD-10-CM | POA: Diagnosis not present

## 2015-05-12 DIAGNOSIS — I69352 Hemiplegia and hemiparesis following cerebral infarction affecting left dominant side: Secondary | ICD-10-CM | POA: Diagnosis not present

## 2015-05-12 DIAGNOSIS — I2510 Atherosclerotic heart disease of native coronary artery without angina pectoris: Secondary | ICD-10-CM | POA: Diagnosis not present

## 2015-05-12 DIAGNOSIS — I69391 Dysphagia following cerebral infarction: Secondary | ICD-10-CM | POA: Diagnosis not present

## 2015-05-12 DIAGNOSIS — R2689 Other abnormalities of gait and mobility: Secondary | ICD-10-CM | POA: Diagnosis not present

## 2015-05-12 DIAGNOSIS — I255 Ischemic cardiomyopathy: Secondary | ICD-10-CM | POA: Diagnosis not present

## 2015-05-12 DIAGNOSIS — I69398 Other sequelae of cerebral infarction: Secondary | ICD-10-CM | POA: Diagnosis not present

## 2015-05-14 DIAGNOSIS — R2689 Other abnormalities of gait and mobility: Secondary | ICD-10-CM | POA: Diagnosis not present

## 2015-05-14 DIAGNOSIS — I2510 Atherosclerotic heart disease of native coronary artery without angina pectoris: Secondary | ICD-10-CM | POA: Diagnosis not present

## 2015-05-14 DIAGNOSIS — I69398 Other sequelae of cerebral infarction: Secondary | ICD-10-CM | POA: Diagnosis not present

## 2015-05-14 DIAGNOSIS — I255 Ischemic cardiomyopathy: Secondary | ICD-10-CM | POA: Diagnosis not present

## 2015-05-14 DIAGNOSIS — I69391 Dysphagia following cerebral infarction: Secondary | ICD-10-CM | POA: Diagnosis not present

## 2015-05-14 DIAGNOSIS — I69352 Hemiplegia and hemiparesis following cerebral infarction affecting left dominant side: Secondary | ICD-10-CM | POA: Diagnosis not present

## 2015-05-15 DIAGNOSIS — I69398 Other sequelae of cerebral infarction: Secondary | ICD-10-CM | POA: Diagnosis not present

## 2015-05-15 DIAGNOSIS — I69391 Dysphagia following cerebral infarction: Secondary | ICD-10-CM | POA: Diagnosis not present

## 2015-05-15 DIAGNOSIS — I69352 Hemiplegia and hemiparesis following cerebral infarction affecting left dominant side: Secondary | ICD-10-CM | POA: Diagnosis not present

## 2015-05-15 DIAGNOSIS — R2689 Other abnormalities of gait and mobility: Secondary | ICD-10-CM | POA: Diagnosis not present

## 2015-05-15 DIAGNOSIS — I255 Ischemic cardiomyopathy: Secondary | ICD-10-CM | POA: Diagnosis not present

## 2015-05-15 DIAGNOSIS — I2510 Atherosclerotic heart disease of native coronary artery without angina pectoris: Secondary | ICD-10-CM | POA: Diagnosis not present

## 2015-05-16 DIAGNOSIS — I69391 Dysphagia following cerebral infarction: Secondary | ICD-10-CM | POA: Diagnosis not present

## 2015-05-16 DIAGNOSIS — I69352 Hemiplegia and hemiparesis following cerebral infarction affecting left dominant side: Secondary | ICD-10-CM | POA: Diagnosis not present

## 2015-05-16 DIAGNOSIS — I69398 Other sequelae of cerebral infarction: Secondary | ICD-10-CM | POA: Diagnosis not present

## 2015-05-16 DIAGNOSIS — R2689 Other abnormalities of gait and mobility: Secondary | ICD-10-CM | POA: Diagnosis not present

## 2015-05-19 ENCOUNTER — Encounter: Payer: Medicare Other | Attending: Physical Medicine & Rehabilitation | Admitting: Physical Medicine & Rehabilitation

## 2015-05-19 DIAGNOSIS — I69398 Other sequelae of cerebral infarction: Secondary | ICD-10-CM | POA: Diagnosis not present

## 2015-05-19 DIAGNOSIS — I69391 Dysphagia following cerebral infarction: Secondary | ICD-10-CM | POA: Diagnosis not present

## 2015-05-19 DIAGNOSIS — I69352 Hemiplegia and hemiparesis following cerebral infarction affecting left dominant side: Secondary | ICD-10-CM | POA: Diagnosis not present

## 2015-05-19 DIAGNOSIS — I2510 Atherosclerotic heart disease of native coronary artery without angina pectoris: Secondary | ICD-10-CM | POA: Diagnosis not present

## 2015-05-19 DIAGNOSIS — R2689 Other abnormalities of gait and mobility: Secondary | ICD-10-CM | POA: Diagnosis not present

## 2015-05-19 DIAGNOSIS — I255 Ischemic cardiomyopathy: Secondary | ICD-10-CM | POA: Diagnosis not present

## 2015-05-20 ENCOUNTER — Telehealth: Payer: Self-pay | Admitting: *Deleted

## 2015-05-20 ENCOUNTER — Telehealth: Payer: Self-pay | Admitting: Cardiovascular Disease

## 2015-05-20 DIAGNOSIS — I2510 Atherosclerotic heart disease of native coronary artery without angina pectoris: Secondary | ICD-10-CM | POA: Diagnosis not present

## 2015-05-20 DIAGNOSIS — I69398 Other sequelae of cerebral infarction: Secondary | ICD-10-CM | POA: Diagnosis not present

## 2015-05-20 DIAGNOSIS — R2689 Other abnormalities of gait and mobility: Secondary | ICD-10-CM | POA: Diagnosis not present

## 2015-05-20 DIAGNOSIS — I69391 Dysphagia following cerebral infarction: Secondary | ICD-10-CM | POA: Diagnosis not present

## 2015-05-20 DIAGNOSIS — I69352 Hemiplegia and hemiparesis following cerebral infarction affecting left dominant side: Secondary | ICD-10-CM | POA: Diagnosis not present

## 2015-05-20 DIAGNOSIS — I255 Ischemic cardiomyopathy: Secondary | ICD-10-CM | POA: Diagnosis not present

## 2015-05-20 NOTE — Telephone Encounter (Signed)
Randall Patel [facility] calling to extend HHST 1 visit in the next 2 weeks to finish up. Approval given.

## 2015-05-20 NOTE — Telephone Encounter (Signed)
I spoke with Windell Moulding at [facility], calling to make Dr Clifton James aware of BP readings.  Windell Moulding states pt is asymptomatic, not in any distress. Windell Moulding states pt's recent BPs have been trending up. Windell Moulding states she will going back to see pt on Thursday. Windell Moulding states pt told her that Dr Clifton James is managing pt's BP.  I will forward to Dr Clifton James for review.

## 2015-05-20 NOTE — Telephone Encounter (Signed)
New Message  Pt c/o BP issue:  1. What are your last 5 BP readings?   184/98 today 2/07 @ 1:45pm  On 2/06 Bp was 150/98  On 02/02 @ 10:40a 178/98   2. Are you having any other symptoms (ex. Dizziness, headache, blurred vision, passed out)? Asymptomatic   3. What is your medication issue? Running a little High    Just wanted  To give an [place]

## 2015-05-21 ENCOUNTER — Encounter (HOSPITAL_COMMUNITY): Payer: Self-pay | Admitting: *Deleted

## 2015-05-21 ENCOUNTER — Emergency Department (HOSPITAL_COMMUNITY)
Admission: EM | Admit: 2015-05-21 | Discharge: 2015-05-21 | Disposition: A | Discharge status: Home / Self Care | Payer: Medicare Other | Attending: Emergency Medicine | Admitting: Emergency Medicine

## 2015-05-21 DIAGNOSIS — E785 Hyperlipidemia, unspecified: Secondary | ICD-10-CM | POA: Insufficient documentation

## 2015-05-21 DIAGNOSIS — I160 Hypertensive urgency: Secondary | ICD-10-CM | POA: Insufficient documentation

## 2015-05-21 DIAGNOSIS — Z79899 Other long term (current) drug therapy: Secondary | ICD-10-CM | POA: Insufficient documentation

## 2015-05-21 DIAGNOSIS — Z9889 Other specified postprocedural states: Secondary | ICD-10-CM | POA: Diagnosis not present

## 2015-05-21 DIAGNOSIS — F17210 Nicotine dependence, cigarettes, uncomplicated: Secondary | ICD-10-CM | POA: Insufficient documentation

## 2015-05-21 DIAGNOSIS — I2510 Atherosclerotic heart disease of native coronary artery without angina pectoris: Secondary | ICD-10-CM | POA: Diagnosis not present

## 2015-05-21 DIAGNOSIS — Z8673 Personal history of transient ischemic attack (TIA), and cerebral infarction without residual deficits: Secondary | ICD-10-CM | POA: Diagnosis not present

## 2015-05-21 DIAGNOSIS — I10 Essential (primary) hypertension: Secondary | ICD-10-CM | POA: Diagnosis present

## 2015-05-21 NOTE — Telephone Encounter (Signed)
Can we check and see what his heart rate is running? If his BP is high and HR is high, I would double Coreg. If his HR is below 70 bpm, would increase Cozaar. Also, not sure exactly what his doses are of Coreg and Cozaar. Thayer Ohm

## 2015-05-21 NOTE — ED Notes (Signed)
Pt reports hx of stroke in dec, home health sent him here today due to sbp >180. Pt has no complaints or symptoms. Reports recent changes in bp meds over past two weeks.

## 2015-05-21 NOTE — Telephone Encounter (Signed)
Enrique Sack PT from [facility] called Windell Moulding [facility] nurse called 2/7 re: BP). She said that the patients BP which has been running very good until this week is getting high Today she found his BP 190/122 and his HR 65  In questioning the patient as to reason why is BP is so different this week the patient said that he started taking medications for male enhancement this week He has been taking L- arginine and a testosterone supplement  She instructed patient to stop taking. Patient is going to the ED for his 190/122 BP

## 2015-05-21 NOTE — ED Provider Notes (Signed)
CSN: [id number]     Arrival date & time 05/21/15  1229 History  By signing my name below, I, Murriel Hopper, attest that this documentation has been prepared under the direction and in the presence of Fayrene Helper, PA-C. Electronically Signed: Murriel Hopper, ED Scribe. 05/21/2015. 2:38 PM.    Chief Complaint  Patient presents with  . Hypertension     Patient is a 65 y.o. male presenting with hypertension. The history is provided by the patient. No language interpreter was used.  Hypertension Pertinent negatives include no chest pain, no abdominal pain and no shortness of breath.   HPI Comments: Randall Patel is a 66 y.o. male with PMHx of CVA, Stroke, CAD who presents to the Emergency Department complaining of constant hypertension that has been present since this morning. Pt reports he was in physical therapy this morning and had his BP measured, which he was told was very high. Pt was recommended to come to ED to get checked out. Pt states he does PT at his house four times per week and has his blood pressure measured every time. Pt reports he was hospitalized in December for a stroke, and has residual left-sided facial droop and problems with speech and eating. Pt also reports having some left arm weakness that has been improving since the stroke occurred. Pt currently denies any headache, visual disturbances, nausea, vomiting, abdominal pain, SOB, chest pain, bowel or urinary changes. No new focal numbness or weakness.  Pt did report taking a new OTC male enhancement supplementation for the past 3-4 days and think it may have caused his BP to elevate.  Denies any other medication changes.    Past Medical History  Diagnosis Date  . Hypertension   . Coronary artery disease     MI in 2008 with PCI to ? vessel; NSTEMI 03/2012; NSTEMI 04/2012 Cath showed triple vessel dz including occluded RCA, s/p DES to distal RCA, PTCA posterolateral branch   . CVA (cerebral infarction)     03/2012 with left arm  weakness/numbness and left facial droop.  . Ischemic cardiomyopathy     04/2012 EF 35-40%  . Hyperlipidemia   . Claudication (HCC)   . Tobacco abuse     quit 04/06/12  . ETOH abuse     quit 04/06/12  . Stroke [facility])    Past Surgical History  Procedure Laterality Date  . Hernia repair    . Left heart catheterization with coronary angiogram N/A 04/11/2012    Procedure: LEFT HEART CATHETERIZATION WITH CORONARY ANGIOGRAM;  Surgeon: Kathleene Hazel, MD;  Location: [facility] CATH LAB;  Service: Cardiovascular;  Laterality: N/A;  . Percutaneous coronary stent intervention (pci-s)  04/11/2012    Procedure: PERCUTANEOUS CORONARY STENT INTERVENTION (PCI-S);  Surgeon: Kathleene Hazel, MD;  Location: [facility] CATH LAB;  Service: Cardiovascular;;  Distal RCA  . Percutaneous coronary intervention-balloon only  04/11/2012    Procedure: PERCUTANEOUS CORONARY INTERVENTION-BALLOON ONLY;  Surgeon: Kathleene Hazel, MD;  Location: [facility] CATH LAB;  Service: Cardiovascular;;  RPLA   Family History  Problem Relation Age of Onset  . Cancer Mother   . Heart attack Father   . Hypertension Mother   . Hypertension Father   . Hypertension Sister   . Hypertension Brother   . Stroke Sister    Social History  Substance Use Topics  . Smoking status: Current Every Day Smoker -- 1.00 packs/day for 40 years    Types: Cigarettes  . Smokeless tobacco: Never Used  . Alcohol Use:  1.8 oz/week    3 Shots of liquor per week     Comment: a 40oz or 2 per day    Review of Systems  Eyes: Negative for visual disturbance.  Respiratory: Negative for shortness of breath.   Cardiovascular: Negative for chest pain.  Gastrointestinal: Negative for nausea, vomiting and abdominal pain.      Allergies  Lisinopril  Home Medications   Prior to Admission medications   Medication Sig Start Date End Date Taking? Authorizing Provider  atorvastatin (LIPITOR) 80 MG tablet Take 1 tablet (80 mg total) by mouth daily with  breakfast. 02/20/15   Janetta Hora, PA-C  carvedilol (COREG) 25 MG tablet Take 1 tablet (25 mg total) by mouth 2 (two) times daily with a meal. 04/25/15   Evlyn Kanner Love, PA-C  losartan (COZAAR) 100 MG tablet Take 1 tablet (100 mg total) by mouth daily. 04/25/15   Jacquelynn Cree, PA-C  nitroGLYCERIN (NITROSTAT) 0.4 MG SL tablet Place 1 tablet (0.4 mg total) under the tongue every 5 (five) minutes as needed for chest pain (up to 3 doses). 04/14/12   Jessica A Hope, PA-C  pantoprazole (PROTONIX) 40 MG tablet Take 1 tablet (40 mg total) by mouth daily. 04/25/15   Evlyn Kanner Love, PA-C  senna-docusate (SENOKOT-S) 8.6-50 MG tablet Take 1 tablet by mouth at bedtime. 04/25/15   Evlyn Kanner Love, PA-C   BP 178/91 mmHg  Pulse 66  Temp(Src) 98.4 F (36.9 C) (Oral)  Resp 18  SpO2 96% Physical Exam  Constitutional: He is oriented to person, place, and time. He appears well-developed and well-nourished.  HENT:  Head: Normocephalic and atraumatic.  Residual left-sided facial droop  Speech normal  Cardiovascular: Normal rate and regular rhythm.   Pulmonary/Chest: Effort normal.  Abdominal: Soft. Bowel sounds are normal. He exhibits no distension.  Musculoskeletal:  Slightly decreased grip strength on left compared to right  Right sided grip strength normal  Neurological: He is alert and oriented to person, place, and time. Gait normal. GCS eye subscore is 4. GCS verbal subscore is 5. GCS motor subscore is 6.  5 out 5 strength to right upper extremity, 4 out of 5 strength to left upper extremities, 5 out of 5 strength to bilateral lower extremities  Skin: Skin is warm.  Psychiatric: He has a normal mood and affect.  Nursing note and vitals reviewed.   ED Course  Procedures (including critical care time)  DIAGNOSTIC STUDIES: Oxygen Saturation is 96% on room air, normal by my interpretation.    COORDINATION OF CARE: 2:37 PM Discussed treatment plan with pt at bedside and pt agreed to plan.  2:44  PM Patient presents with asymptomatic hypotension. I encourage patient to discontinue his over-the-counter male enhancement supplementation as it may contribute to his elevated blood pressure. Otherwise patient is stable for discharge. He will follow-up with his primary care provider for further care. Recommend keeping a close eye on his blood pressure.   MDM   Final diagnoses:  Asymptomatic hypertensive urgency    BP 178/91 mmHg  Pulse 66  Temp(Src) 98.4 F (36.9 C) (Oral)  Resp 18  SpO2 96%   I personally performed the services described in this documentation, which was scribed in my presence. The recorded information has been reviewed and is accurate.      Fayrene Helper, PA-C 05/21/15 1446  Samuel Jester, DO 05/25/15 2124

## 2015-05-21 NOTE — Telephone Encounter (Signed)
Thanks!    Randall Patel.

## 2015-05-21 NOTE — Telephone Encounter (Signed)
Left message for Windell Moulding at [facility] to give call and give [place] the patients HR per request of Dr. Clifton James

## 2015-05-21 NOTE — Discharge Instructions (Signed)
Please avoid taking your male enhancement supplementation.  Monitor your blood pressure closely and follow up with your new doctor for further management of your health.  Take your blood pressure as prescribed.     Emergency Department Resource Guide 1) Find a Doctor and Pay Out of Pocket Although you won't have to find out who is covered by your insurance plan, it is a good idea to ask around and get recommendations. You will then need to call the office and see if the doctor you have chosen will accept you as a new patient and what types of options they offer for patients who are self-pay. Some doctors offer discounts or will set up payment plans for their patients who do not have insurance, but you will need to ask so you aren't surprised when you get to your appointment.  2) Contact Your Local Health Department Not all health departments have doctors that can see patients for sick visits, but many do, so it is worth a call to see if yours does. If you don't know where your local health department is, you can check in your phone book. The CDC also has a tool to help you locate your state's health department, and many state websites also have listings of all of their local health departments.  3) Find a Walk-in Clinic If your illness is not likely to be very severe or complicated, you may want to try a walk in clinic. These are popping up all over the country in pharmacies, drugstores, and shopping centers. They're usually staffed by nurse practitioners or physician assistants that have been trained to treat common illnesses and complaints. They're usually fairly quick and inexpensive. However, if you have serious medical issues or chronic medical problems, these are probably not your best option.  No Primary Care Doctor: - Call Health Connect at  [phone] - they can help you locate a primary care doctor that  accepts your insurance, provides certain services, etc. - Physician Referral Service-  [phone]  Chronic Pain Problems: Organization         Address  Phone   Notes  [facility]  [phone] Patients need to be referred by their primary care doctor.   Medication Assistance: Organization         Address  Phone   Notes  [facility] Medication [facility] [address] [place] [place], [place] [address] [phone] --Must be a resident of [facility] -- Must have NO insurance coverage whatsoever (no Medicaid/ Medicare, etc.) -- The pt. MUST have a primary care doctor that directs their care regularly and follows them in the community   [facility]ist  [phone]   [employer]  [phone]    Agencies that provide inexpensive medical care: Organization         Address  Phone   Notes  [facility]  [phone]   [facility]    [phone]   [facility] [address] [place], [place] [address] [phone]   [facility] [address] [place], [place] [phone]   Planned Parenthood    [phone]   [facility]    [phone]   Community Health and [facility]  [address], [place] Phone:  [phone], Fax:  [phone] Hours of Operation:  9 am - 6 pm, M-F.  Also accepts Medicaid/Medicare and self-pay.  [facility]  [address], [place] Phone: [phone], Fax: [phone]. Hours of Operation:  8:30 am - 5:30 pm, M-F.  Also accepts Medicaid and self-pay.  [facility] [place] [address], [place] Phone: [phone]   [place], [place], [place] [phone], Ext. [phone] Mondays & Thursdays: 7-9 AM.  First 15 patients are seen on a first come, first serve basis.    [place] Providers:  Organization         Address  Phone   Notes  [facility] [address],  [place] [phone] Also accepts self-pay patients.  [facility] [address], [place]  [phone]   [place], [address], [place] [phone]   [facility] [address] [place] [phone]   Lucianne Lei [address], [place]   [phone] Only accepts [place] Access [place] patients after they have their name applied to their card.   Self-Pay (no insurance) in [facility]:  Organization         Address  Phone   Notes  Sickle Cell Patients, [facility] Internal Medicine [place] [phone]   [facility] Urgent Care [place] [phone]   [facility] [place]  [place], [place], [place] [phone]   [facility]/Dr. Osei-Bonsu  [address], [place] or [address], [place] [phone] Phone number for both [place] and [place] locations is the same.  Urgent Medical and [facility] [address], [place] [phone]   [facility] [address] [place] or [address] [phone]   [facility] [address], [place] [phone], phone; [phone], fax Sees patients 1st and 3rd Saturday of every month.  Must not qualify for public or private insurance (i.e. Medicaid, Medicare, [place] Health Choice, Veterans' Benefits)  Household income should be no more than 200% of the poverty level The clinic cannot treat you if you are pregnant or think you are pregnant  Sexually transmitted diseases are not treated at the clinic.    Dental Care: Organization         Address  Phone  Notes  [facility] Department of [facility] [phone] Accepts children up to age 29 who are enrolled in  [place] or [place]; pregnant women with a Medicaid card; and children who have applied for Medicaid or [place] Health Choice, but were declined, whose parents can pay a reduced fee at time of service.  [facility] Department of [facility]  [address], [place] [phone] Accepts children up to age 36 who are enrolled in [place] or [place]; pregnant women with a Medicaid card; and children who have applied for Medicaid or [place] Health Choice, but were declined, whose parents can pay a reduced fee at time of service.  [facility]  [place] [phone] Patients are seen by appointment only. Walk-ins are not accepted. [place] will see patients 66 years of age and older. Monday - Tuesday (8am-5pm) Most Wednesdays (8:30-5pm) $30 per visit, cash only  [facility]  [address], [facility] [phone] Patients are seen by appointment only. Walk-ins are not accepted. [place] will see patients 55 years of age and older. One Wednesday Evening (Monthly: Volunteer Based).  $30 per visit, cash only  [place]  [phone] for adults; Children under age 16, call Graduate Pediatric Dentistry at [phone]. Children aged 15-14, please call [phone] to request a pediatric application.  Dental services are provided in all areas of dental care including fillings, crowns and bridges, complete and partial dentures, implants, gum treatment, root canals, and extractions. Preventive care is also provided. Treatment is provided to both adults and children. Patients are selected via a lottery and there is often a waiting list.   [facility] [address] [place]  [phone] [email or web address]   [facility] [address] [place], [place] [phone], Ext. [phone] Second and Fourth Thursday of each month, opens at 6:30  AM; Clinic ends at 9 AM.  Patients are seen on a first-come first-served basis, and a limited number are seen during each clinic.   [facility]  [address] Hillard Danker [place], [place] [phone]   Eligibility Requirements You must have lived in [place], [place], or [place] counties for at least the last three months.   You cannot be eligible for state or federal sponsored [employer], including [employer], [place], or [employer].   You generally cannot be eligible for healthcare insurance through your employer.    How to apply: Eligibility screenings are held every Tuesday and Wednesday afternoon from 1:00 pm until 4:00 pm. You do not need an appointment for the interview!  [facility] [address] [place], [place]   [employer]  [place] Department  [place]  [phone]    Behavioral Health Resources in the Community: Intensive Outpatient Programs Organization         Address  Phone  Notes  [place]. [address], [place], [place] [phone]   [facility] Outpatient [address], [place], [employer]   ADS: Alcohol & Drug S[facility] [address], [place], [place]   [place] [address]  [place], [place] or [phone]   Substance Abuse Resources Organization         Address  Phone  Notes  Alcohol and Drug Services  [phone]   [place]  [phone]   [facility] [place]   Chinita Pester  [phone]   Residential & Outpatient Substance Abuse Program  [phone]   Psychological Services Organization         Address  Phone  Notes  [facility] [place]  [place]  [phone]   [place] [address] [place] [phone] or  [phone]    Mobile Crisis Teams Organization         Address  Phone  Notes  [facility], Mobile Crisis Care Unit  [phone]   [facility]  [address] [place], [place]   [place] [address] [place] [phone]    Self-Help/Support Groups Organization         Address  Phone             Notes  [facility]ental [facility] of [place] - variety of support groups  [place] Call for more information  Narcotics Anonymous (NA), [facility]aring [facility] [address], [employer] [place]  2 meetings at this location   Special educational needs teacher         Address  Phone  Notes  [facility] [place],    [place]  [phone]   [facility]  [address], [place] [address], [place], [place]   [place], [place] [phone] Admissions: 8am-3pm M-F  Incentives Substance [place] [address],    [place], [place] [phone]   [facility] [address] [place], [place], [place]   [facility] [address],  [place], [place]   [facility] - Intensive Outpatient [address], [place], [place]   [facility] ([place].) [address].,  [place], [place] [phone] or [phone]   [facility] (RTS) [address] [place], [place] Accepts Medicaid  [facility] [place] [address]  [place] [phone] Substance Abuse/Addiction Treatment   [facility] Organization         Address  Phone  Notes  [facility]  [phone]   Domenic Schwab, PhD [address] Arlis [place], [place]   [phone] or [phone]   [place], [place] [phone]   [facility] [address],  [place], [place] [phone] Insurance/Medicaid/sponsorship through [facility] and Families [address], [address]                                    [place], [place] [phone] [place] [address], [place] [phone]    Dr. Adele Schilder  [phone]   [facility] Dept. 1) [address] [place] 2) [place] 3)  [place] [address], [place] [phone]  [phone]   [place] [phone] or [phone] (After Hours)

## 2015-05-21 NOTE — ED Notes (Signed)
[place] PA at bedside 

## 2015-05-21 NOTE — Telephone Encounter (Signed)
Follow up      Pt c/o BP issue: STAT if pt c/o blurred vision, one-sided weakness or slurred speech  1. What are your last 5 BP readings? 190/122 2. Are you having any other symptoms (ex. Dizziness, headache, blurred vision, passed out)?  No symptoms 3. What is your BP issue? Nurse is calling to report pt's bp being high

## 2015-05-21 NOTE — Telephone Encounter (Signed)
Randall Patel, [facility] from [facility] called and gave patients pulse range to be 58 to 70 bpm

## 2015-05-22 DIAGNOSIS — I69352 Hemiplegia and hemiparesis following cerebral infarction affecting left dominant side: Secondary | ICD-10-CM | POA: Diagnosis not present

## 2015-05-22 DIAGNOSIS — R2689 Other abnormalities of gait and mobility: Secondary | ICD-10-CM | POA: Diagnosis not present

## 2015-05-22 DIAGNOSIS — I2510 Atherosclerotic heart disease of native coronary artery without angina pectoris: Secondary | ICD-10-CM | POA: Diagnosis not present

## 2015-05-22 DIAGNOSIS — I69391 Dysphagia following cerebral infarction: Secondary | ICD-10-CM | POA: Diagnosis not present

## 2015-05-22 DIAGNOSIS — I255 Ischemic cardiomyopathy: Secondary | ICD-10-CM | POA: Diagnosis not present

## 2015-05-22 DIAGNOSIS — I69398 Other sequelae of cerebral infarction: Secondary | ICD-10-CM | POA: Diagnosis not present

## 2015-05-22 NOTE — Telephone Encounter (Signed)
Chart reviewed and pt was seen in ED on 2/8. I placed call to pt and scheduled appt for him to see Tereso Newcomer, PA on 05/29/15 at 11:10 for post ED follow up.  Pt has 6 month follow up with Dr. Clifton James scheduled for June.

## 2015-05-23 ENCOUNTER — Other Ambulatory Visit: Payer: Self-pay | Admitting: Physical Medicine and Rehabilitation

## 2015-05-24 ENCOUNTER — Other Ambulatory Visit: Payer: Self-pay | Admitting: Physical Medicine and Rehabilitation

## 2015-05-26 ENCOUNTER — Other Ambulatory Visit: Payer: Self-pay | Admitting: *Deleted

## 2015-05-26 ENCOUNTER — Telehealth: Payer: Self-pay | Admitting: Cardiovascular Disease

## 2015-05-26 DIAGNOSIS — R2689 Other abnormalities of gait and mobility: Secondary | ICD-10-CM | POA: Diagnosis not present

## 2015-05-26 DIAGNOSIS — I69352 Hemiplegia and hemiparesis following cerebral infarction affecting left dominant side: Secondary | ICD-10-CM | POA: Diagnosis not present

## 2015-05-26 DIAGNOSIS — I69391 Dysphagia following cerebral infarction: Secondary | ICD-10-CM | POA: Diagnosis not present

## 2015-05-26 DIAGNOSIS — I2510 Atherosclerotic heart disease of native coronary artery without angina pectoris: Secondary | ICD-10-CM | POA: Diagnosis not present

## 2015-05-26 DIAGNOSIS — I69398 Other sequelae of cerebral infarction: Secondary | ICD-10-CM | POA: Diagnosis not present

## 2015-05-26 DIAGNOSIS — I255 Ischemic cardiomyopathy: Secondary | ICD-10-CM | POA: Diagnosis not present

## 2015-05-26 MED ORDER — LOSARTAN POTASSIUM 100 MG PO TABS: 100.0000 mg | ORAL_TABLET | Freq: Every day | ORAL | Status: DC

## 2015-05-26 MED ORDER — CARVEDILOL 25 MG PO TABS: 25.0000 mg | ORAL_TABLET | Freq: Two times a day (BID) | ORAL | Status: DC

## 2015-05-26 NOTE — Telephone Encounter (Signed)
Follow up      Nurse insist on talking to a nurse

## 2015-05-26 NOTE — Telephone Encounter (Signed)
SPOKE WITH  KENDRA  .PT   RAN OUT OF   CARVEDILOLL AND LOSARTAN  NEEDED REFILLS  REFILL SENT  AS  REQUESTED  VIA  EPIC .Zack Seal

## 2015-05-26 NOTE — Telephone Encounter (Signed)
New Message   Kendra with [facility] called. Request to have a verbal Order for Nursing to evaluate and treat for medication organization and teaching. Still getting High BP. Another High reading today 190/110 pulse 80  Pt has also run out of Bp medications She has called those in for the patient

## 2015-05-27 ENCOUNTER — Telehealth: Payer: Self-pay | Admitting: *Deleted

## 2015-05-27 NOTE — Telephone Encounter (Signed)
[facility] called to request extension 2wk2 starting next week.  Approval given.

## 2015-05-28 DIAGNOSIS — R2689 Other abnormalities of gait and mobility: Secondary | ICD-10-CM | POA: Diagnosis not present

## 2015-05-28 DIAGNOSIS — I69398 Other sequelae of cerebral infarction: Secondary | ICD-10-CM | POA: Diagnosis not present

## 2015-05-28 DIAGNOSIS — I69352 Hemiplegia and hemiparesis following cerebral infarction affecting left dominant side: Secondary | ICD-10-CM | POA: Diagnosis not present

## 2015-05-28 DIAGNOSIS — I255 Ischemic cardiomyopathy: Secondary | ICD-10-CM | POA: Diagnosis not present

## 2015-05-28 DIAGNOSIS — I2510 Atherosclerotic heart disease of native coronary artery without angina pectoris: Secondary | ICD-10-CM | POA: Diagnosis not present

## 2015-05-28 DIAGNOSIS — I69391 Dysphagia following cerebral infarction: Secondary | ICD-10-CM | POA: Diagnosis not present

## 2015-05-28 NOTE — Progress Notes (Signed)
Cardiology Office Note:    Date:  05/29/2015   ID:  Randall Patel, DOB May 11, 1950, MRN [id number]  PCP:  No PCP Per Patient  Cardiologist:  Dr. Verne Carrow   Electrophysiologist:  n/a  Chief Complaint  Patient presents with  . Hospitalization Follow-up    EF visit for High BP    History of Present Illness:     Randall Patel is a 65 y.o. male with a hx of CAD, ischemic cardiomyopathy prior CVA, HTN, HL. He suffered his first myocardial infarction in 2008. He was admitted in 12/13 with a non-STEMI. LHC demonstrated totally occluded distal RCA which was treated with a DES and POBA of a PL segment. EF has remained depressed at 35-40%. Last seen by Dr. Clifton James 1/16.  Last seen by [name], PA-C 11/16.    Admitted 12/23-12/29 2016 with acute R frontoparietal IC hemorrhage in the setting of uncontrolled HTN.  Echo during that admission demonstrated Mod LVH, EF 40-45%, inf-lat AK, Gr 1 DD, mild MR.  reviewed the discharge summary indicates there was concern for right frontal encephalomalacia and concern for embolic infarcts. Outpatient event monitor could be considered as an outpatient. He was discharged to inpatient rehabilitation.  Patient was seen in the emergency 05/21/15 with elevated blood pressure.  He has home health physical therapy. His blood pressures are elevated at home. He returns for follow-up. He denies chest pain, significant dyspnea, orthopnea, PND or edema. He denies syncope or dizziness.   Past Medical History  Diagnosis Date  . Hypertension   . Coronary artery disease     MI in 2008 with PCI to ? vessel; NSTEMI 03/2012; NSTEMI 04/2012 Cath showed triple vessel dz including occluded RCA, s/p DES to distal RCA, PTCA posterolateral branch   . CVA (cerebral infarction)     03/2012 with left arm weakness/numbness and left facial droop.  . Ischemic cardiomyopathy     04/2012 EF 35-40%  . Hyperlipidemia   . Claudication (HCC)   . Tobacco abuse     quit  04/06/12  . ETOH abuse     quit 04/06/12  . Stroke [facility])     Past Surgical History  Procedure Laterality Date  . Hernia repair    . Left heart catheterization with coronary angiogram N/A 04/11/2012    Procedure: LEFT HEART CATHETERIZATION WITH CORONARY ANGIOGRAM;  Surgeon: Kathleene Hazel, MD;  Location: [facility] CATH LAB;  Service: Cardiovascular;  Laterality: N/A;  . Percutaneous coronary stent intervention (pci-s)  04/11/2012    Procedure: PERCUTANEOUS CORONARY STENT INTERVENTION (PCI-S);  Surgeon: Kathleene Hazel, MD;  [facility] LAB;  Service: Cardiovascular;;  Distal RCA  . Percutaneous coronary intervention-balloon only  04/11/2012    Procedure: PERCUTANEOUS CORONARY INTERVENTION-BALLOON ONLY;  Surgeon: Kathleene Hazel, MD;  [facility] LAB;  Service: Cardiovascular;;  RPLA    Current Medications: Outpatient Prescriptions Prior to Visit  Medication Sig Dispense Refill  . atorvastatin (LIPITOR) 80 MG tablet Take 1 tablet (80 mg total) by mouth daily with breakfast. 90 tablet 3  . nitroGLYCERIN (NITROSTAT) 0.4 MG SL tablet Place 1 tablet (0.4 mg total) under the tongue every 5 (five) minutes as needed for chest pain (up to 3 doses). 25 tablet 3  . carvedilol (COREG) 25 MG tablet Take 1 tablet (25 mg total) by mouth 2 (two) times daily with a meal. 60 tablet 1  . losartan (COZAAR) 100 MG tablet Take 1 tablet (100 mg total) by mouth daily. 30 tablet 1  .  pantoprazole (PROTONIX) 40 MG tablet Take 1 tablet (40 mg total) by mouth daily. (Patient not taking: Reported on 05/29/2015) 30 tablet 0  . senna-docusate (SENOKOT-S) 8.6-50 MG tablet Take 1 tablet by mouth at bedtime. 30 tablet 0   No facility-administered medications prior to visit.     Allergies:   Lisinopril   Social History   Social History  . Marital Status: Single    Spouse Name: N/A  . Number of Children: N/A  . Years of Education: N/A   Social History Main Topics  . Smoking status:  Current Every Day Smoker -- 1.00 packs/day for 40 years    Types: Cigarettes  . Smokeless tobacco: Never Used  . Alcohol Use: 1.8 oz/week    3 Shots of liquor per week     Comment: a 40oz or 2 per day  . Drug Use: Yes    Special: Marijuana  . Sexual Activity: Not Asked   Other Topics Concern  . None   Social History Narrative     Family History:  The patient's family history includes Cancer in his mother; Heart attack in his father; Hypertension in his brother, father, mother, and sister; Stroke in his sister.   ROS:   Please see the history of present illness.    ROS All other systems reviewed and are negative.   Physical Exam:    VS:  BP 180/100 mmHg  Pulse 56  Ht 5\' 5"  (1.651 m)  Wt 149 lb 12.8 oz (67.949 kg)  BMI 24.93 kg/m2   GEN: Well nourished, well developed, in no acute distress HEENT: normal Neck: no JVD, no masses Cardiac: Normal S1/S2, RRR; no murmurs,  no edema;     Respiratory:  clear to auscultation bilaterally; no wheezing, rhonchi or rales GI: soft, nontender MS: no deformity or atrophy Skin: warm and dry Neuro: Left facial droop  Psych: Alert and oriented x 3, normal affect  Wt Readings from Last 3 Encounters:  05/29/15 149 lb 12.8 oz (67.949 kg)  04/23/15 151 lb 1.6 oz (68.539 kg)  04/04/15 156 lb 12 oz (71.1 kg)      Studies/Labs Reviewed:     EKG:  EKG is  ordered today.  The ekg ordered today demonstrates sinus bradycardia, HR 55, normal axis, T-wave inversions in 3, aVF, LVH, QTc 417 ms, no change from prior tracing  Recent Labs: 04/07/2015: TSH 0.557 04/11/2015: ALT 54; Hemoglobin 11.6*; Platelets 169 04/15/2015: BUN 8; Creatinine, Ser 0.97; Potassium 4.1; Sodium 139   Recent Lipid Panel    Component Value Date/Time   CHOL 179 04/07/2015 0203   TRIG 67 04/07/2015 0203   HDL 96 04/07/2015 0203   CHOLHDL 1.9 04/07/2015 0203   VLDL 13 04/07/2015 0203   LDLCALC 70 04/07/2015 0203    Additional studies/ records that were reviewed  today include:   Carotid US 04/08/15 Bilat ICA 1-39%  Echo 04/07/15 Mod LVH, EF 40-45%, inf-lat AK, Gr 1 DD, mild MR  Echo 2/14 EF 35-40%, inferior HK, grade 1 diastolic dysfunction, MAC, mild MR  Carotid US 12/13 No sig ICA stenosis  LHC 12/13 LM: Ostial 30% LAD: Proximal 30%, mid 60% LCx: Proximal 30%, small bifurcating OM3 with diffuse 90% involving both branches RCA: 50% mid, 100% distal EF 30-35%, inferior akinesis PCI: 2.5 x 20 mm Promus Premier DES in the distal RCA, balloon angioplasty of a PL branch   ASSESSMENT:     1. Hypertensive heart disease without heart failure   2. Coronary artery  disease involving native coronary artery of native heart without angina pectoris   3. Ischemic cardiomyopathy   4. Hyperlipemia   5. History of CVA (cerebrovascular accident)     PLAN:     In order of problems listed above:  1. Hypertensive heart disease - Blood pressure remains elevated. He is on maximum dose carvedilol and losartan. Given his history of ischemic cardiomyopathy he would benefit from hydralazine, nitrates as well as spironolactone. I will place him on hydralazine 25 mg 3 times a day. He does not take PDE-5 inhibitors. At follow-up consider adding isosorbide versus spironolactone. Obtain basic metabolic panel today.  2. CAD: S/p MI in 2008 and NSTEMI in 2013 tx with DES to RCA and POBA to PL branch. No angina. He is currently off of aspirin secondary to recent intracerebral hemorrhage. Continue statin, beta blocker.  3. Ischemic Cardiomyopathy: EF  40-45%.  Continue beta blocker, ARB. Start hydralazine as noted. Consider spironolactone, nitrates at follow-up.  4. Hyperlipidemia: Continue statin.  5. CVA - He is status post recent hemorrhagic CVA. Await follow-up with neurology prior to resuming aspirin. There was some concern for embolic phenomenon when in the hospital. He is not a candidate for anticoagulation at this time. I will for my note to the  neurologist to see if event monitor as needed.    Medication Adjustments/Labs and Tests Ordered: Current medicines are reviewed at length with the patient today.  Concerns regarding medicines are outlined above.  Medication changes, Labs and Tests ordered today are outlined in the Patient Instructions noted below. Patient Instructions  Medication Instructions:  1. START HYDRALAZINE 25 MG 1 TABLET THREE TIMES DAILY  Labwork: TODAY BMET  Testing/Procedures: NONE  Follow-Up: 06/12/15 @ 11:10 WITH Breean Nannini, ,PAC   Any Other Special Instructions Will Be Listed Below (If Applicable). BRING BLOOD PRESSURE READINGS TO YOUR NEXT APPT WITH Jonea Bukowski, [facility]  If you need a refill on your cardiac medications before your next appointment, please call your pharmacy.   Signed, Tereso Newcomer, PA-C  05/29/2015 7:19 PM    [facility] [address] [place], [place], [place]  [address] Phone: [phone]; Fax: [phone]

## 2015-05-29 ENCOUNTER — Ambulatory Visit (INDEPENDENT_AMBULATORY_CARE_PROVIDER_SITE_OTHER): Payer: Medicare Other | Admitting: Physician Assistant

## 2015-05-29 ENCOUNTER — Other Ambulatory Visit: Payer: Self-pay | Admitting: Neurology

## 2015-05-29 ENCOUNTER — Encounter: Payer: Self-pay | Admitting: Physician Assistant

## 2015-05-29 VITALS — BP 180/100 | HR 56 | Ht 65.0 in | Wt 149.8 lb

## 2015-05-29 DIAGNOSIS — I2589 Other forms of chronic ischemic heart disease: Secondary | ICD-10-CM | POA: Diagnosis not present

## 2015-05-29 DIAGNOSIS — I119 Hypertensive heart disease without heart failure: Secondary | ICD-10-CM

## 2015-05-29 DIAGNOSIS — I2510 Atherosclerotic heart disease of native coronary artery without angina pectoris: Secondary | ICD-10-CM

## 2015-05-29 DIAGNOSIS — I611 Nontraumatic intracerebral hemorrhage in hemisphere, cortical: Secondary | ICD-10-CM

## 2015-05-29 DIAGNOSIS — Z8673 Personal history of transient ischemic attack (TIA), and cerebral infarction without residual deficits: Secondary | ICD-10-CM

## 2015-05-29 DIAGNOSIS — I255 Ischemic cardiomyopathy: Secondary | ICD-10-CM

## 2015-05-29 DIAGNOSIS — E785 Hyperlipidemia, unspecified: Secondary | ICD-10-CM

## 2015-05-29 LAB — BASIC METABOLIC PANEL
BUN: 9 mg/dL (ref 7–25)
CHLORIDE: 105 mmol/L (ref 98–110)
CO2: 25 mmol/L (ref 20–31)
CREATININE: 0.94 mg/dL (ref 0.70–1.25)
Calcium: 10 mg/dL (ref 8.6–10.3)
Glucose, Bld: 87 mg/dL (ref 65–99)
Potassium: 4.1 mmol/L (ref 3.5–5.3)
Sodium: 138 mmol/L (ref 135–146)

## 2015-05-29 MED ORDER — HYDRALAZINE HCL 25 MG PO TABS: 25.0000 mg | ORAL_TABLET | Freq: Three times a day (TID) | ORAL | Status: DC

## 2015-05-29 MED ORDER — CARVEDILOL 25 MG PO TABS: 25.0000 mg | ORAL_TABLET | Freq: Two times a day (BID) | ORAL | Status: DC

## 2015-05-29 MED ORDER — LOSARTAN POTASSIUM 100 MG PO TABS
100.0000 mg | ORAL_TABLET | Freq: Every day | ORAL | Status: DC
Start: 2015-05-29 — End: 2015-06-24

## 2015-05-29 NOTE — Patient Instructions (Addendum)
Medication Instructions:  1. START HYDRALAZINE 25 MG 1 TABLET THREE TIMES DAILY  Labwork: TODAY BMET  Testing/Procedures: NONE  Follow-Up: 06/12/15 @ 11:10 WITH SCOTT WEAVER, ,PAC   Any Other Special Instructions Will Be Listed Below (If Applicable). BRING BLOOD PRESSURE READINGS TO YOUR NEXT APPT WITH SCOTT WEAVER, [facility]  If you need a refill on your cardiac medications before your next appointment, please call your pharmacy.

## 2015-05-30 ENCOUNTER — Telehealth: Payer: Self-pay | Admitting: Cardiovascular Disease

## 2015-05-30 DIAGNOSIS — I255 Ischemic cardiomyopathy: Secondary | ICD-10-CM | POA: Diagnosis not present

## 2015-05-30 DIAGNOSIS — I69352 Hemiplegia and hemiparesis following cerebral infarction affecting left dominant side: Secondary | ICD-10-CM | POA: Diagnosis not present

## 2015-05-30 DIAGNOSIS — I69398 Other sequelae of cerebral infarction: Secondary | ICD-10-CM | POA: Diagnosis not present

## 2015-05-30 DIAGNOSIS — R2689 Other abnormalities of gait and mobility: Secondary | ICD-10-CM | POA: Diagnosis not present

## 2015-05-30 DIAGNOSIS — I69391 Dysphagia following cerebral infarction: Secondary | ICD-10-CM | POA: Diagnosis not present

## 2015-05-30 DIAGNOSIS — I2510 Atherosclerotic heart disease of native coronary artery without angina pectoris: Secondary | ICD-10-CM | POA: Diagnosis not present

## 2015-05-30 NOTE — Telephone Encounter (Signed)
Spoke with Revonda Standard (speech therapist). She saw pt today and blood pressure was 158/102. Pt had not started hydralazine that was prescribed yesterday.  He is going to pick up today. BP at yesterday's office visit was 180/100.  Has been taking both doses of Coreg in the AM.  Revonda Standard instructed pt on proper way to take. Speech therapy is signing off after today but PT will continue to follow pt.  I told her I would contact pt to make sure he was picking up his medications today.  I spoke with pt and he reports when he went to pharmacy the only medication he could pick up was his cholesterol medication. He was told he could not pick up the new prescription until March 8,2017.  Pt looked at his medication bottles and confirms he has Coreg and Losartan.  I told him I would check with pharmacy. I called [facility] and Hydralazine is ready to be picked up and will cost $1.20.  It is too soon to refill Coreg and Losartan. They can be refilled on March 8,2017.  I spoke with pt and told him hydralazine was ready and he needed to pick it up today

## 2015-05-30 NOTE — Telephone Encounter (Signed)
Pt BP was 158/102. Please call asap

## 2015-06-02 ENCOUNTER — Telehealth: Payer: Self-pay | Admitting: *Deleted

## 2015-06-02 DIAGNOSIS — R2689 Other abnormalities of gait and mobility: Secondary | ICD-10-CM | POA: Diagnosis not present

## 2015-06-02 DIAGNOSIS — I69391 Dysphagia following cerebral infarction: Secondary | ICD-10-CM | POA: Diagnosis not present

## 2015-06-02 DIAGNOSIS — I2510 Atherosclerotic heart disease of native coronary artery without angina pectoris: Secondary | ICD-10-CM | POA: Diagnosis not present

## 2015-06-02 DIAGNOSIS — I69352 Hemiplegia and hemiparesis following cerebral infarction affecting left dominant side: Secondary | ICD-10-CM | POA: Diagnosis not present

## 2015-06-02 DIAGNOSIS — I69398 Other sequelae of cerebral infarction: Secondary | ICD-10-CM | POA: Diagnosis not present

## 2015-06-02 DIAGNOSIS — I255 Ischemic cardiomyopathy: Secondary | ICD-10-CM | POA: Diagnosis not present

## 2015-06-02 DIAGNOSIS — I119 Hypertensive heart disease without heart failure: Secondary | ICD-10-CM

## 2015-06-02 NOTE — Telephone Encounter (Signed)
Pt haas been notified of lab results by phone with verbal understanding.

## 2015-06-04 DIAGNOSIS — I255 Ischemic cardiomyopathy: Secondary | ICD-10-CM | POA: Diagnosis not present

## 2015-06-04 DIAGNOSIS — I69352 Hemiplegia and hemiparesis following cerebral infarction affecting left dominant side: Secondary | ICD-10-CM | POA: Diagnosis not present

## 2015-06-04 DIAGNOSIS — I69391 Dysphagia following cerebral infarction: Secondary | ICD-10-CM | POA: Diagnosis not present

## 2015-06-04 DIAGNOSIS — R2689 Other abnormalities of gait and mobility: Secondary | ICD-10-CM | POA: Diagnosis not present

## 2015-06-04 DIAGNOSIS — I69398 Other sequelae of cerebral infarction: Secondary | ICD-10-CM | POA: Diagnosis not present

## 2015-06-04 DIAGNOSIS — I2510 Atherosclerotic heart disease of native coronary artery without angina pectoris: Secondary | ICD-10-CM | POA: Diagnosis not present

## 2015-06-09 ENCOUNTER — Telehealth: Payer: Self-pay | Admitting: Cardiovascular Disease

## 2015-06-09 DIAGNOSIS — R2689 Other abnormalities of gait and mobility: Secondary | ICD-10-CM | POA: Diagnosis not present

## 2015-06-09 DIAGNOSIS — I69352 Hemiplegia and hemiparesis following cerebral infarction affecting left dominant side: Secondary | ICD-10-CM | POA: Diagnosis not present

## 2015-06-09 DIAGNOSIS — I2510 Atherosclerotic heart disease of native coronary artery without angina pectoris: Secondary | ICD-10-CM | POA: Diagnosis not present

## 2015-06-09 DIAGNOSIS — I69398 Other sequelae of cerebral infarction: Secondary | ICD-10-CM | POA: Diagnosis not present

## 2015-06-09 DIAGNOSIS — I255 Ischemic cardiomyopathy: Secondary | ICD-10-CM | POA: Diagnosis not present

## 2015-06-09 DIAGNOSIS — I69391 Dysphagia following cerebral infarction: Secondary | ICD-10-CM | POA: Diagnosis not present

## 2015-06-09 NOTE — Telephone Encounter (Signed)
Patient had last appointment and next appointment with Randall Patel. Will forward to Randall Patel and to Randall Patel.  Reviewed with Randall Patel, Home care nurse.  Pt has all meds and seems to be taking correctly and consistently.   Patient was scheduled with Randall Patel for follow up for 06/12/15 but that was cancelled due to "no transportation".  Rescheduled for 06/20/15. I called him to try to schedule for 06/11/15 with Randall Patel but his sister is his transportation and she will no be available.  Provided phone number for [facility] to try to help with transportation needs.  Also adv someone will call him back with any medication changes or new recommendations.  Pt verbalizes understanding.

## 2015-06-09 NOTE — Telephone Encounter (Signed)
Add Isosorbide Mononitrate 30 mg Once daily (if he takes Viagra, Cialis, Levitra - he should not take Isosorbide and let us know that he takes one of these medications) Increase Hydralazine 50 mg Three times a day  Tereso Newcomer, PA-C   06/09/2015 5:21 PM

## 2015-06-09 NOTE — Telephone Encounter (Signed)
New message     Pt c/o BP issue: STAT if pt c/o blurred vision, one-sided weakness or slurred speech  1. What are your last 5 BP readings? Feb 20th 172/98 HR 58, feb 22 180/104 HR 67 after mid day dosage 160/98 HR 59, feb 27 170/100 HR 67  2. Are you having any other symptoms (ex. Dizziness, headache, blurred vision, passed out)? no  3. What is your BP issue? Calling to give bp readings.  Pt has an upcoming appt

## 2015-06-10 MED ORDER — HYDRALAZINE HCL 50 MG PO TABS: 50.0000 mg | ORAL_TABLET | Freq: Three times a day (TID) | ORAL | Status: DC

## 2015-06-10 MED ORDER — ISOSORBIDE MONONITRATE ER 30 MG PO TB24: 30.0000 mg | ORAL_TABLET | Freq: Every day | ORAL | Status: DC

## 2015-06-10 NOTE — Telephone Encounter (Signed)
I s/w pt today about med changes. Pt states he does not take Cilias, Viagara or Levitra so he is ok to take Imdur. Pt advised to keep appt on 3/10 w/PA and monitor BP and let us know 3/6 what readings are. Pt agreeable to plan of care. 

## 2015-06-10 NOTE — Telephone Encounter (Signed)
I s/w pt today about med changes. Pt states he does not take Cilias, Viagara or Levitra so he is ok to take Imdur. Pt advised to keep appt on 3/10 w/PA and monitor BP and let us know 3/6 what readings are. Pt agreeable to plan of care.

## 2015-06-10 NOTE — Addendum Note (Signed)
Addended by: Tarri Fuller on: 06/10/2015 09:26 AM   Modules accepted: Orders

## 2015-06-11 DIAGNOSIS — R2689 Other abnormalities of gait and mobility: Secondary | ICD-10-CM | POA: Diagnosis not present

## 2015-06-11 DIAGNOSIS — I69391 Dysphagia following cerebral infarction: Secondary | ICD-10-CM | POA: Diagnosis not present

## 2015-06-11 DIAGNOSIS — I69352 Hemiplegia and hemiparesis following cerebral infarction affecting left dominant side: Secondary | ICD-10-CM | POA: Diagnosis not present

## 2015-06-11 DIAGNOSIS — I255 Ischemic cardiomyopathy: Secondary | ICD-10-CM | POA: Diagnosis not present

## 2015-06-11 DIAGNOSIS — I69398 Other sequelae of cerebral infarction: Secondary | ICD-10-CM | POA: Diagnosis not present

## 2015-06-11 DIAGNOSIS — I2510 Atherosclerotic heart disease of native coronary artery without angina pectoris: Secondary | ICD-10-CM | POA: Diagnosis not present

## 2015-06-12 ENCOUNTER — Other Ambulatory Visit: Payer: Medicare Other

## 2015-06-12 ENCOUNTER — Ambulatory Visit: Payer: Medicare Other | Admitting: Physician Assistant

## 2015-06-12 ENCOUNTER — Inpatient Hospital Stay: Admission: RE | Admit: 2015-06-12 | Payer: Medicare Other | Source: Ambulatory Visit

## 2015-06-19 ENCOUNTER — Ambulatory Visit: Payer: Medicare Other | Admitting: Physician Assistant

## 2015-06-20 ENCOUNTER — Ambulatory Visit: Payer: Medicare Other | Admitting: Physician Assistant

## 2015-06-24 ENCOUNTER — Telehealth: Payer: Self-pay | Admitting: Physician Assistant

## 2015-06-24 DIAGNOSIS — I255 Ischemic cardiomyopathy: Secondary | ICD-10-CM

## 2015-06-24 DIAGNOSIS — I119 Hypertensive heart disease without heart failure: Secondary | ICD-10-CM

## 2015-06-24 MED ORDER — HYDRALAZINE HCL 50 MG PO TABS: 50.0000 mg | ORAL_TABLET | Freq: Three times a day (TID) | ORAL | Status: DC

## 2015-06-24 MED ORDER — LOSARTAN POTASSIUM 100 MG PO TABS: 100.0000 mg | ORAL_TABLET | Freq: Every day | ORAL | Status: DC

## 2015-06-24 MED ORDER — CARVEDILOL 25 MG PO TABS: 25.0000 mg | ORAL_TABLET | Freq: Two times a day (BID) | ORAL | Status: DC

## 2015-06-24 NOTE — Telephone Encounter (Signed)
New message       *STAT* If patient is at the pharmacy, call can be transferred to refill team.   1. Which medications need to be refilled? (please list name of each medication and dose if known) carvedilol 25mg  (4 pills left), hydralazine 50mg , losartan 100mg  (1 pill left) 2. Which pharmacy/location (including street and city if local pharmacy) is medication to be sent to? [facility] @ [address]  3. Do they need a 30 day or 90 day supply?  30 day supply Pt has an appt on 06-30-15.  He will run out of medication before the appt date.  Pt thinks the doctor is going to change one or more prescriptions.  Should he get these refilled?

## 2015-06-24 NOTE — Telephone Encounter (Signed)
Returned call to patient 90 day refills sent to pharmacy. 

## 2015-06-29 NOTE — Progress Notes (Signed)
Cardiology Office Note:    Date:  06/30/2015   ID:  Randall Patel, DOB 09-16-1950, MRN [id number]  PCP:  No PCP Per Patient  Cardiologist:  Dr. Verne Carrow   Electrophysiologist:  n/a  Chief Complaint  Patient presents with  . Follow-up    HTN,CAD    History of Present Illness:     Randall Patel is a 65 y.o. male with a hx of CAD, ischemic cardiomyopathy prior CVA, HTN, HL. He suffered his first myocardial infarction in 2008. He was admitted in 12/13 with a non-STEMI. LHC demonstrated totally occluded distal RCA which was treated with a DES and POBA of a PL segment. EF has remained depressed at 35-40%. Last seen by Dr. Clifton James 1/16.  Last seen by [name], PA-C 11/16.    Admitted 12/16 with acute R frontoparietal IC hemorrhage in the setting of uncontrolled HTN.  Echo during that admission demonstrated Mod LVH, EF 40-45%, inf-lat AK, Gr 1 DD, mild MR.  Review of the discharge summary indicates there was concern for right frontal encephalomalacia and concern for embolic infarcts. Outpatient event monitor could be considered as an outpatient. He was discharged to inpatient rehabilitation.  I saw him in follow-up 05/29/15. I contacted his neurologist.  Currently, he was not felt to be a candidate for anticoagulation. Neurology plans to do an MRI/MRA of the brain to rule out amyloid angiopathy, brain tumor, AVM. If it is felt that the patient had an ischemic stroke with hemorrhagic transformation instead of spontaneous intracranial hemorrhage, anticoagulation could be considered later. Once his follow-up MRI demonstrates all blood is reabsorbed, aspirin can be resumed. The patient sees neurology back next month.  He returns for FU.  He is doing well.  The patient denies any chest pain, significant dyspnea, syncope, orthopnea, PND, edema.   Past Medical History  Diagnosis Date  . Hypertension   . Coronary artery disease     MI in 2008 with PCI to ? vessel; NSTEMI 03/2012;  NSTEMI 04/2012 Cath showed triple vessel dz including occluded RCA, s/p DES to distal RCA, PTCA posterolateral branch   . CVA (cerebral infarction)     03/2012 with left arm weakness/numbness and left facial droop.  . Ischemic cardiomyopathy     04/2012 EF 35-40%  . Hyperlipidemia   . Claudication (HCC)   . Tobacco abuse     quit 04/06/12  . ETOH abuse     quit 04/06/12  . Stroke [facility])     Past Surgical History  Procedure Laterality Date  . Hernia repair    . Left heart catheterization with coronary angiogram N/A 04/11/2012    Procedure: LEFT HEART CATHETERIZATION WITH CORONARY ANGIOGRAM;  Surgeon: Kathleene Hazel, MD;  Location: [facility] CATH LAB;  Service: Cardiovascular;  Laterality: N/A;  . Percutaneous coronary stent intervention (pci-s)  04/11/2012    Procedure: PERCUTANEOUS CORONARY STENT INTERVENTION (PCI-S);  Surgeon: Kathleene Hazel, MD;  Location: [facility] CATH LAB;  Service: Cardiovascular;;  Distal RCA  . Percutaneous coronary intervention-balloon only  04/11/2012    Procedure: PERCUTANEOUS CORONARY INTERVENTION-BALLOON ONLY;  Surgeon: Kathleene Hazel, MD;  Location: [facility] CATH LAB;  Service: Cardiovascular;;  RPLA    Current Medications: Outpatient Prescriptions Prior to Visit  Medication Sig Dispense Refill  . carvedilol (COREG) 25 MG tablet Take 1 tablet (25 mg total) by mouth 2 (two) times daily with a meal. 180 tablet 3  . hydrALAZINE (APRESOLINE) 50 MG tablet Take 1 tablet (50 mg total) by mouth 3 (three)  times daily. 270 tablet 3  . losartan (COZAAR) 100 MG tablet Take 1 tablet (100 mg total) by mouth daily. 90 tablet 3  . nitroGLYCERIN (NITROSTAT) 0.4 MG SL tablet Place 1 tablet (0.4 mg total) under the tongue every 5 (five) minutes as needed for chest pain (up to 3 doses). 25 tablet 3  . atorvastatin (LIPITOR) 80 MG tablet Take 1 tablet (80 mg total) by mouth daily with breakfast. 90 tablet 3  . isosorbide mononitrate (IMDUR) 30 MG 24 hr tablet Take 1 tablet  (30 mg total) by mouth daily. 30 tablet 11   No facility-administered medications prior to visit.     Allergies:   Lisinopril   Social History   Social History  . Marital Status: Single    Spouse Name: N/A  . Number of Children: N/A  . Years of Education: N/A   Social History Main Topics  . Smoking status: Current Every Day Smoker -- 1.00 packs/day for 40 years    Types: Cigarettes  . Smokeless tobacco: Never Used  . Alcohol Use: 1.8 oz/week    3 Shots of liquor per week     Comment: a 40oz or 2 per day  . Drug Use: Yes    Special: Marijuana  . Sexual Activity: Not Asked   Other Topics Concern  . None   Social History Narrative     Family History:  The patient's family history includes Cancer in his mother; Heart attack in his father; Hypertension in his brother, father, mother, and sister; Stroke in his sister.   ROS:   Please see the history of present illness.    Review of Systems  Constitution: Positive for weight loss.  All other systems reviewed and are negative.   Physical Exam:    VS:  BP 128/98 mmHg  Pulse 64  Ht 5\' 5"  (1.651 m)  Wt 152 lb (68.947 kg)  BMI 25.29 kg/m2   GEN: Well nourished, well developed, in no acute distress HEENT: normal Neck: no JVD, no masses Cardiac: Normal S1/S2, RRR; no murmurs,  no edema;     Respiratory:  clear to auscultation bilaterally; no wheezing, rhonchi or rales GI: soft, nontender MS: no deformity or atrophy Skin: warm and dry Neuro: Left facial droop  Psych: Alert and oriented x 3, normal affect  Wt Readings from Last 3 Encounters:  06/30/15 152 lb (68.947 kg)  05/29/15 149 lb 12.8 oz (67.949 kg)  04/23/15 151 lb 1.6 oz (68.539 kg)      Studies/Labs Reviewed:     EKG:  EKG is  ordered today.  The ekg ordered today demonstrates sinus bradycardia, HR 58, normal axis, T-wave inversions in 3, aVF, LVH, QTc 420 ms, no change from prior tracing  Recent Labs: 04/07/2015: TSH 0.557 04/11/2015: ALT 54;  Hemoglobin 11.6*; Platelets 169 05/29/2015: BUN 9; Creat 0.94; Potassium 4.1; Sodium 138   Recent Lipid Panel    Component Value Date/Time   CHOL 179 04/07/2015 0203   TRIG 67 04/07/2015 0203   HDL 96 04/07/2015 0203   CHOLHDL 1.9 04/07/2015 0203   VLDL 13 04/07/2015 0203   LDLCALC 70 04/07/2015 0203    Additional studies/ records that were reviewed today include:   Carotid US 04/08/15 Bilat ICA 1-39%  Echo 04/07/15 Mod LVH, EF 40-45%, inf-lat AK, Gr 1 DD, mild MR  Echo 2/14 EF 35-40%, inferior HK, grade 1 diastolic dysfunction, MAC, mild MR  Carotid US 12/13 No sig ICA stenosis  LHC 12/13 LM: Ostial  30% LAD: Proximal 30%, mid 60% LCx: Proximal 30%, small bifurcating OM3 with diffuse 90% involving both branches RCA: 50% mid, 100% distal EF 30-35%, inferior akinesis PCI: 2.5 x 20 mm Promus Premier DES in the distal RCA, balloon angioplasty of a PL branch   ASSESSMENT:     1. Hypertensive heart disease without heart failure   2. Coronary artery disease involving native coronary artery of native heart without angina pectoris   3. Ischemic cardiomyopathy   4. Hyperlipemia   5. History of CVA (cerebrovascular accident)     PLAN:     In order of problems listed above:  1. Hypertensive heart disease - Blood pressure  is improved but, remains elevated. He is on maximum dose carvedilol and losartan. Continue hydralazine 25 mg 3 times a day.  I think he would benefit from Spironolactone.    -  Spironolactone 25 mg QD  -  BMET 5 days and 12 days after 1st dose  2. CAD: S/p MI in 2008 and NSTEMI in 2013 tx with DES to RCA and POBA to PL branch. No angina. He is currently off of aspirin secondary to recent intracerebral hemorrhage. Continue statin, beta blocker.  As noted, I have been in touch with his neurologist. As soon as his MRI demonstrates reabsorption of blood, antiplatelet therapy can be resumed.  3. Ischemic Cardiomyopathy: EF  40-45%.  Continue beta blocker,  ARB, hydralazine as noted.   Add spironolactone as noted.  4. Hyperlipidemia: Continue statin.  5. CVA - He is status post recent hemorrhagic CVA. Await follow-up with neurology prior to resuming aspirin. There was some concern for embolic phenomenon when in the hospital. He is not a candidate for anticoagulation at this time. As noted by Dr. Roda Shutters, if follow-up studies demonstrate what appears to be ischemic stroke with hemorrhagic transformation, about monitoring can be considered in the future.    Medication Adjustments/Labs and Tests Ordered: Current medicines are reviewed at length with the patient today.  Concerns regarding medicines are outlined above.  Medication changes, Labs and Tests ordered today are outlined in the Patient Instructions noted below. Patient Instructions  Medication Instructions:  1. START SPIRONOLACTONE 25 MG DAILY; RX SENT IN  Labwork: 1. BMET 3/27 2. BMET 4/3  Testing/Procedures: NONE   Follow-Up: KEEP YOUR APPT WITH DR. Clifton James IN 09/2015  Any Other Special Instructions Will Be Listed Below (If Applicable). YOU WILL NEED TO CALL [facility] [phone] TO RESCHEDULE YOUR MRI; THIS IS VERY IMPORTANT TO HAVE DONE  ALSO YOU HAVE AN APPT WITH DR. Roda Shutters NEUROLOGY 07/2015; BE SURE TO KEEP THIS APPT.   If you need a refill on your cardiac medications before your next appointment, please call your pharmacy.    Signed, Tereso Newcomer, PA-C  06/30/2015 5:06 PM    [facility] [address] [place], [place], [place]  [address] Phone: [phone]; Fax: [phone]

## 2015-06-30 ENCOUNTER — Ambulatory Visit (INDEPENDENT_AMBULATORY_CARE_PROVIDER_SITE_OTHER): Payer: Medicare Other | Admitting: Physician Assistant

## 2015-06-30 ENCOUNTER — Telehealth: Payer: Self-pay

## 2015-06-30 ENCOUNTER — Encounter: Payer: Self-pay | Admitting: Physician Assistant

## 2015-06-30 VITALS — BP 128/98 | HR 64 | Ht 65.0 in | Wt 152.0 lb

## 2015-06-30 DIAGNOSIS — I255 Ischemic cardiomyopathy: Secondary | ICD-10-CM

## 2015-06-30 DIAGNOSIS — Z8673 Personal history of transient ischemic attack (TIA), and cerebral infarction without residual deficits: Secondary | ICD-10-CM

## 2015-06-30 DIAGNOSIS — I119 Hypertensive heart disease without heart failure: Secondary | ICD-10-CM

## 2015-06-30 DIAGNOSIS — I2510 Atherosclerotic heart disease of native coronary artery without angina pectoris: Secondary | ICD-10-CM | POA: Diagnosis not present

## 2015-06-30 DIAGNOSIS — E785 Hyperlipidemia, unspecified: Secondary | ICD-10-CM | POA: Diagnosis not present

## 2015-06-30 MED ORDER — SPIRONOLACTONE 25 MG PO TABS: 25.0000 mg | ORAL_TABLET | Freq: Every day | ORAL | Status: DC

## 2015-06-30 NOTE — Patient Instructions (Addendum)
Medication Instructions:  1. START SPIRONOLACTONE 25 MG DAILY; RX SENT IN  Labwork: 1. BMET 3/27 2. BMET 4/3  Testing/Procedures: NONE   Follow-Up: KEEP YOUR APPT WITH DR. Clifton James IN 09/2015  Any Other Special Instructions Will Be Listed Below (If Applicable). YOU WILL NEED TO CALL [facility] [phone] TO RESCHEDULE YOUR MRI; THIS IS VERY IMPORTANT TO HAVE DONE  ALSO YOU HAVE AN APPT WITH DR. Roda Shutters NEUROLOGY 07/2015; BE SURE TO KEEP THIS APPT.   If you need a refill on your cardiac medications before your next appointment, please call your pharmacy.

## 2015-06-30 NOTE — Telephone Encounter (Signed)
Rn receive incoming call from phone staff Okey Regal at [facility].Okey Regal stated patient had MRI brain and head schedule on 06/12/2015. Okey Regal stated patient was a no show because he did not know how to get there. Pt was given directions. Pt has new patient follow up in April 2017. Okey Regal stated they cannot start any anticoagulant until he has the MRI. Rn gave Okey Regal the [facility] so patient reschedule the MRI

## 2015-07-07 ENCOUNTER — Encounter: Payer: Medicare Other | Attending: Physical Medicine & Rehabilitation | Admitting: Physical Medicine & Rehabilitation

## 2015-07-09 ENCOUNTER — Other Ambulatory Visit: Payer: Medicare Other

## 2015-07-14 ENCOUNTER — Other Ambulatory Visit: Payer: Medicare Other

## 2015-07-14 ENCOUNTER — Ambulatory Visit: Payer: Self-pay | Admitting: Neurology

## 2015-07-14 ENCOUNTER — Inpatient Hospital Stay: Admission: RE | Admit: 2015-07-14 | Payer: Medicare Other | Source: Ambulatory Visit

## 2015-07-15 ENCOUNTER — Encounter: Payer: Self-pay | Admitting: Neurology

## 2015-07-21 ENCOUNTER — Other Ambulatory Visit (INDEPENDENT_AMBULATORY_CARE_PROVIDER_SITE_OTHER): Payer: Medicare Other | Admitting: *Deleted

## 2015-07-21 DIAGNOSIS — I255 Ischemic cardiomyopathy: Secondary | ICD-10-CM

## 2015-07-21 DIAGNOSIS — I2589 Other forms of chronic ischemic heart disease: Secondary | ICD-10-CM | POA: Diagnosis not present

## 2015-07-21 DIAGNOSIS — I119 Hypertensive heart disease without heart failure: Secondary | ICD-10-CM | POA: Diagnosis not present

## 2015-07-21 DIAGNOSIS — I2510 Atherosclerotic heart disease of native coronary artery without angina pectoris: Secondary | ICD-10-CM

## 2015-07-21 LAB — BASIC METABOLIC PANEL
BUN: 12 mg/dL (ref 7–25)
CHLORIDE: 103 mmol/L (ref 98–110)
CO2: 27 mmol/L (ref 20–31)
CREATININE: 0.82 mg/dL (ref 0.70–1.25)
Calcium: 10.2 mg/dL (ref 8.6–10.3)
Glucose, Bld: 151 mg/dL — ABNORMAL HIGH (ref 65–99)
Potassium: 4.4 mmol/L (ref 3.5–5.3)
Sodium: 138 mmol/L (ref 135–146)

## 2015-07-22 ENCOUNTER — Telehealth: Payer: Self-pay | Admitting: *Deleted

## 2015-07-22 NOTE — Telephone Encounter (Signed)
No answer

## 2015-07-24 NOTE — Telephone Encounter (Signed)
Pt notified of lab results by phone with verbal understanding.  

## 2015-07-25 ENCOUNTER — Other Ambulatory Visit: Payer: Medicare Other

## 2015-07-29 ENCOUNTER — Ambulatory Visit
Admission: RE | Admit: 2015-07-29 | Discharge: 2015-07-29 | Disposition: A | Discharge status: Home / Self Care | Payer: Medicare Other | Source: Ambulatory Visit | Attending: Neurology | Admitting: Neurology

## 2015-07-29 DIAGNOSIS — I611 Nontraumatic intracerebral hemorrhage in hemisphere, cortical: Secondary | ICD-10-CM

## 2015-07-29 MED ORDER — GADOBENATE DIMEGLUMINE 529 MG/ML IV SOLN
14.0000 mL | Freq: Once | INTRAVENOUS | Status: AC | PRN
  Administered 2015-07-29: 14 mL via INTRAVENOUS

## 2015-08-01 ENCOUNTER — Other Ambulatory Visit (INDEPENDENT_AMBULATORY_CARE_PROVIDER_SITE_OTHER): Payer: Medicare Other | Admitting: *Deleted

## 2015-08-01 ENCOUNTER — Other Ambulatory Visit: Payer: Self-pay | Admitting: *Deleted

## 2015-08-01 DIAGNOSIS — I119 Hypertensive heart disease without heart failure: Secondary | ICD-10-CM | POA: Diagnosis not present

## 2015-08-01 DIAGNOSIS — I2510 Atherosclerotic heart disease of native coronary artery without angina pectoris: Secondary | ICD-10-CM

## 2015-08-01 DIAGNOSIS — I255 Ischemic cardiomyopathy: Secondary | ICD-10-CM

## 2015-08-01 DIAGNOSIS — I2589 Other forms of chronic ischemic heart disease: Secondary | ICD-10-CM | POA: Diagnosis not present

## 2015-08-01 LAB — BASIC METABOLIC PANEL
BUN: 7 mg/dL (ref 7–25)
CHLORIDE: 103 mmol/L (ref 98–110)
CO2: 26 mmol/L (ref 20–31)
CREATININE: 0.9 mg/dL (ref 0.70–1.25)
Calcium: 9.8 mg/dL (ref 8.6–10.3)
Glucose, Bld: 92 mg/dL (ref 65–99)
Potassium: 4 mmol/L (ref 3.5–5.3)
Sodium: 137 mmol/L (ref 135–146)

## 2015-08-01 MED ORDER — SPIRONOLACTONE 25 MG PO TABS: 25.0000 mg | ORAL_TABLET | Freq: Every day | ORAL | Status: DC

## 2015-08-01 MED ORDER — CARVEDILOL 25 MG PO TABS: 25.0000 mg | ORAL_TABLET | Freq: Two times a day (BID) | ORAL | Status: DC

## 2015-08-04 ENCOUNTER — Telehealth: Payer: Self-pay | Admitting: *Deleted

## 2015-08-04 ENCOUNTER — Telehealth: Payer: Self-pay

## 2015-08-04 NOTE — Telephone Encounter (Signed)
Pt notified of lab results by phone with verbal.

## 2015-08-04 NOTE — Telephone Encounter (Signed)
Rn call patient about r/s his appt from April for a no show. Pt was schedule for a hospital follow up. Rn explain Dr. Roda Shutters wants to schedule him on 08/12/2015 to discuss MRi. Pt stated he cannot come on that day due to transportation. Patient stated he will have a ride on 08/15/2015. Rn explain his appt is at 1200 but he needs to arrive at 1130am for check in. Pt verbalized understanding.

## 2015-08-11 ENCOUNTER — Emergency Department (HOSPITAL_COMMUNITY)
Admission: EM | Admit: 2015-08-11 | Discharge: 2015-08-11 | Disposition: A | Discharge status: Home / Self Care | Payer: Medicare Other | Attending: Emergency Medicine | Admitting: Emergency Medicine

## 2015-08-11 ENCOUNTER — Encounter (HOSPITAL_COMMUNITY): Payer: Self-pay | Admitting: Nurse Practitioner

## 2015-08-11 ENCOUNTER — Emergency Department (HOSPITAL_COMMUNITY): Payer: Medicare Other

## 2015-08-11 DIAGNOSIS — I10 Essential (primary) hypertension: Secondary | ICD-10-CM | POA: Diagnosis not present

## 2015-08-11 DIAGNOSIS — E785 Hyperlipidemia, unspecified: Secondary | ICD-10-CM | POA: Diagnosis not present

## 2015-08-11 DIAGNOSIS — I2510 Atherosclerotic heart disease of native coronary artery without angina pectoris: Secondary | ICD-10-CM | POA: Diagnosis not present

## 2015-08-11 DIAGNOSIS — Z79899 Other long term (current) drug therapy: Secondary | ICD-10-CM | POA: Diagnosis not present

## 2015-08-11 DIAGNOSIS — I252 Old myocardial infarction: Secondary | ICD-10-CM | POA: Insufficient documentation

## 2015-08-11 DIAGNOSIS — R079 Chest pain, unspecified: Secondary | ICD-10-CM | POA: Diagnosis not present

## 2015-08-11 DIAGNOSIS — F17210 Nicotine dependence, cigarettes, uncomplicated: Secondary | ICD-10-CM | POA: Insufficient documentation

## 2015-08-11 DIAGNOSIS — Z8673 Personal history of transient ischemic attack (TIA), and cerebral infarction without residual deficits: Secondary | ICD-10-CM | POA: Diagnosis not present

## 2015-08-11 DIAGNOSIS — Z9889 Other specified postprocedural states: Secondary | ICD-10-CM | POA: Insufficient documentation

## 2015-08-11 DIAGNOSIS — Z720 Tobacco use: Secondary | ICD-10-CM

## 2015-08-11 LAB — BASIC METABOLIC PANEL
ANION GAP: 10 (ref 5–15)
BUN: 13 mg/dL (ref 6–20)
CALCIUM: 9.7 mg/dL (ref 8.9–10.3)
CO2: 25 mmol/L (ref 22–32)
CREATININE: 0.99 mg/dL (ref 0.61–1.24)
Chloride: 104 mmol/L (ref 101–111)
GFR calc Af Amer: >60 mL/min (ref >60)
GLUCOSE: 89 mg/dL (ref 65–99)
Potassium: 3.8 mmol/L (ref 3.5–5.1)
Sodium: 139 mmol/L (ref 135–145)

## 2015-08-11 LAB — CBC
HCT: 42.1 % (ref 39.0–52.0)
HEMOGLOBIN: 13.6 g/dL (ref 13.0–17.0)
MCH: 26.8 pg (ref 26.0–34.0)
MCHC: 32.3 g/dL (ref 30.0–36.0)
MCV: 82.9 fL (ref 78.0–100.0)
PLATELETS: 204 10*3/uL (ref 150–400)
RBC: 5.08 MIL/uL (ref 4.22–5.81)
RDW: 15.3 % (ref 11.5–15.5)
WBC: 4.6 10*3/uL (ref 4.0–10.5)

## 2015-08-11 LAB — I-STAT TROPONIN, ED: TROPONIN I, POC: 0.01 ng/mL (ref 0.00–0.08)

## 2015-08-11 NOTE — ED Notes (Signed)
Pt c/o 1 hour history of pressure in chest, onset while at rest watching television. He took nitro with no relief of the pain. Denies sob, nausea, dizziness, diaphoresis. He is alert and breathing. He is hypertensive in triage, he reports taking his BP medication daily as prescribed.

## 2015-08-11 NOTE — Discharge Instructions (Signed)
Follow-up for the chest pain with your cardiologist in 1 week Try to stop smoking cigarettes   Nonspecific Chest Pain It is often hard to find the cause of chest pain. There is always a chance that your pain could be related to something serious, such as a heart attack or a blood clot in your lungs. Chest pain can also be caused by conditions that are not life-threatening. If you have chest pain, it is very important to follow up with your doctor.  HOME CARE  If you were prescribed an antibiotic medicine, finish it all even if you start to feel better.  Avoid any activities that cause chest pain.  Do not use any tobacco products, including cigarettes, chewing tobacco, or electronic cigarettes. If you need help quitting, ask your doctor.  Do not drink alcohol.  Take medicines only as told by your doctor.  Keep all follow-up visits as told by your doctor. This is important. This includes any further testing if your chest pain does not go away.  Your doctor may tell you to keep your head raised (elevated) while you sleep.  Make lifestyle changes as told by your doctor. These may include:  Getting regular exercise. Ask your doctor to suggest some activities that are safe for you.  Eating a heart-healthy diet. Your doctor or a diet specialist (dietitian) can help you to learn healthy eating options.  Maintaining a healthy weight.  Managing diabetes, if necessary.  Reducing stress. GET HELP IF:  Your chest pain does not go away, even after treatment.  You have a rash with blisters on your chest.  You have a fever. GET HELP RIGHT AWAY IF:  Your chest pain is worse.  You have an increasing cough, or you cough up blood.  You have severe belly (abdominal) pain.  You feel extremely weak.  You pass out (faint).  You have chills.  You have sudden, unexplained chest discomfort.  You have sudden, unexplained discomfort in your arms, back, neck, or jaw.  You have shortness  of breath at any time.  You suddenly start to sweat, or your skin gets clammy.  You feel nauseous.  You vomit.  You suddenly feel light-headed or dizzy.  Your heart begins to beat quickly, or it feels like it is skipping beats. These symptoms may be an emergency. Do not wait to see if the symptoms will go away. Get medical help right away. Call your local emergency services (911 in the [place] Do not drive yourself to the hospital.   This information is not intended to replace advice given to you by your health care provider. Make sure you discuss any questions you have with your health care provider.   Document Released: 09/15/2007 Document Revised: 04/19/2014 Document Reviewed: 11/02/2013 Elsevier Interactive Patient Education 2016 [employer].  Smoking Hazards Smoking cigarettes is extremely bad for your health. Tobacco smoke has over 200 known poisons in it. It contains the poisonous gases nitrogen oxide and carbon monoxide. There are over 60 chemicals in tobacco smoke that cause cancer. Some of the chemicals found in cigarette smoke include:   Cyanide.   Benzene.   Formaldehyde.   Methanol (wood alcohol).   Acetylene (fuel used in welding torches).   Ammonia.  Even smoking lightly shortens your life expectancy by several years. You can greatly reduce the risk of medical problems for you and your family by stopping now. Smoking is the most preventable cause of death and disease in our society. Within days of  quitting smoking, your circulation improves, you decrease the risk of having a heart attack, and your lung capacity improves. There may be some increased phlegm in the first few days after quitting, and it may take months for your lungs to clear up completely. Quitting for 10 years reduces your risk of developing lung cancer to almost that of a nonsmoker.  WHAT ARE THE RISKS OF SMOKING? Cigarette smokers have an increased risk of many serious medical problems,  including:  Lung cancer.   Lung disease (such as pneumonia, bronchitis, and emphysema).   Heart attack and chest pain due to the heart not getting enough oxygen (angina).   Heart disease and peripheral blood vessel disease.   Hypertension.   Stroke.   Oral cancer (cancer of the lip, mouth, or voice box).   Bladder cancer.   Pancreatic cancer.   Cervical cancer.   Pregnancy complications, including premature birth.   Stillbirths and smaller newborn babies, birth defects, and genetic damage to sperm.   Early menopause.   Lower estrogen level for women.   Infertility.   Facial wrinkles.   Blindness.   Increased risk of broken bones (fractures).   Senile dementia.   Stomach ulcers and internal bleeding.   Delayed wound healing and increased risk of complications during surgery. Because of secondhand smoke exposure, children of smokers have an increased risk of the following:   Sudden infant death syndrome (SIDS).   Respiratory infections.   Lung cancer.   Heart disease.   Ear infections.  WHY IS SMOKING ADDICTIVE? Nicotine is the chemical agent in tobacco that is capable of causing addiction or dependence. When you smoke and inhale, nicotine is absorbed rapidly into the bloodstream through your lungs. Both inhaled and noninhaled nicotine may be addictive.  WHAT ARE THE BENEFITS OF QUITTING?  There are many health benefits to quitting smoking. Some are:   The likelihood of developing cancer and heart disease decreases. Health improvements are seen almost immediately.   Blood pressure, pulse rate, and breathing patterns start returning to normal soon after quitting.   People who quit may see an improvement in their overall quality of life.  HOW DO YOU QUIT SMOKING? Smoking is an addiction with both physical and psychological effects, and longtime habits can be hard to change. Your health care provider can recommend:  Programs  and community resources, which may include group support, education, or therapy.  Replacement products, such as patches, gum, and nasal sprays. Use these products only as directed. Do not replace cigarette smoking with electronic cigarettes (commonly called e-cigarettes). The safety of e-cigarettes is unknown, and some may contain harmful chemicals. FOR MORE INFORMATION  American Lung Association: www.lung.org  American Cancer Society: www.cancer.org   This information is not intended to replace advice given to you by your health care provider. Make sure you discuss any questions you have with your health care provider.   Document Released: 05/06/2004 Document Revised: 01/17/2013 Document Reviewed: 09/18/2012 Elsevier Interactive Patient Education [employer]

## 2015-08-11 NOTE — ED Provider Notes (Signed)
CSN: [id number]     Arrival date & time 08/11/15  1821 History   First MD Initiated Contact with Patient 08/11/15 2121     Chief Complaint  Patient presents with  . Chest Pain     (Consider location/radiation/quality/duration/timing/severity/associated sxs/prior Treatment) HPI   Randall Patel is a 65 y.o. male who presents for evaluation of episodic chest pain which started at 5:30 tonight while he was watching television. He took 2 nitroglycerin, and the pain did not get better so a near. After arrival, the pain went away while he was waiting to be seen. There was no associated diaphoresis, nausea, vomiting, weakness or dizziness. He takes nitroglycerin sporadically but not recently. He denies cough or fever. He has continued to drink alcohol, and smokes cigarettes. He denies use of cocaine. There are no other no modifying factors.   Past Medical History  Diagnosis Date  . Hypertension   . Coronary artery disease     MI in 2008 with PCI to ? vessel; NSTEMI 03/2012; NSTEMI 04/2012 Cath showed triple vessel dz including occluded RCA, s/p DES to distal RCA, PTCA posterolateral branch   . CVA (cerebral infarction)     03/2012 with left arm weakness/numbness and left facial droop.  . Ischemic cardiomyopathy     04/2012 EF 35-40%  . Hyperlipidemia   . Claudication (HCC)   . Tobacco abuse     quit 04/06/12  . ETOH abuse     quit 04/06/12  . Stroke [facility])    Past Surgical History  Procedure Laterality Date  . Hernia repair    . Left heart catheterization with coronary angiogram N/A 04/11/2012    Procedure: LEFT HEART CATHETERIZATION WITH CORONARY ANGIOGRAM;  Surgeon: Kathleene Hazel, MD;  Location: [facility] CATH LAB;  Service: Cardiovascular;  Laterality: N/A;  . Percutaneous coronary stent intervention (pci-s)  04/11/2012    Procedure: PERCUTANEOUS CORONARY STENT INTERVENTION (PCI-S);  Surgeon: Kathleene Hazel, MD;  Location: [facility] CATH LAB;  Service: Cardiovascular;;  Distal RCA   . Percutaneous coronary intervention-balloon only  04/11/2012    Procedure: PERCUTANEOUS CORONARY INTERVENTION-BALLOON ONLY;  Surgeon: Kathleene Hazel, MD;  Location: [facility] CATH LAB;  Service: Cardiovascular;;  RPLA   Family History  Problem Relation Age of Onset  . Cancer Mother   . Heart attack Father   . Hypertension Mother   . Hypertension Father   . Hypertension Sister   . Hypertension Brother   . Stroke Sister    Social History  Substance Use Topics  . Smoking status: Current Every Day Smoker -- 1.00 packs/day for 40 years    Types: Cigarettes  . Smokeless tobacco: Never Used  . Alcohol Use: 1.8 oz/week    3 Shots of liquor per week     Comment: a 40oz or 2 per day    Review of Systems  All other systems reviewed and are negative.     Allergies  Lisinopril  Home Medications   Prior to Admission medications   Medication Sig Start Date End Date Taking? Authorizing Provider  atorvastatin (LIPITOR) 80 MG tablet Take 80 mg by mouth every morning. 05/26/15  Yes Historical Provider, MD  carvedilol (COREG) 25 MG tablet Take 1 tablet (25 mg total) by mouth 2 (two) times daily with a meal. Patient taking differently: Take 25 mg by mouth every morning.  08/01/15  Yes Kathleene Hazel, MD  losartan (COZAAR) 100 MG tablet Take 1 tablet (100 mg total) by mouth daily. 06/24/15  Yes Cristal Deer  Ellene Route, MD  nitroGLYCERIN (NITROSTAT) 0.4 MG SL tablet Place 1 tablet (0.4 mg total) under the tongue every 5 (five) minutes as needed for chest pain (up to 3 doses). 04/14/12  Yes Jessica A Hope, PA-C  spironolactone (ALDACTONE) 25 MG tablet Take 1 tablet (25 mg total) by mouth daily. 08/01/15  Yes Kathleene Hazel, MD  hydrALAZINE (APRESOLINE) 50 MG tablet Take 1 tablet (50 mg total) by mouth 3 (three) times daily. 06/24/15   Kathleene Hazel, MD   BP 181/96 mmHg  Pulse 58  Temp(Src) 98.7 F (37.1 C) (Oral)  Resp 16  SpO2 100% Physical Exam  Constitutional: He is  oriented to person, place, and time. He appears well-developed.  Appears older than stated age  HENT:  Head: Normocephalic and atraumatic.  Right Ear: External ear normal.  Left Ear: External ear normal.  Eyes: Conjunctivae and EOM are normal. Pupils are equal, round, and reactive to light.  Neck: Normal range of motion and phonation normal. Neck supple.  Cardiovascular: Normal rate, regular rhythm and normal heart sounds.   Pulmonary/Chest: Effort normal and breath sounds normal. He exhibits no bony tenderness.  Abdominal: Soft. There is no tenderness.  Musculoskeletal: Normal range of motion. He exhibits no edema or tenderness.  Neurological: He is alert and oriented to person, place, and time. No cranial nerve deficit or sensory deficit. He exhibits normal muscle tone. Coordination normal.  Skin: Skin is warm, dry and intact.  Psychiatric: He has a normal mood and affect. His behavior is normal. Judgment and thought content normal.  Nursing note and vitals reviewed.   ED Course  Procedures (including critical care time)  Medications - No data to display  Patient Vitals for the past 24 hrs:  BP Temp Temp src Pulse Resp SpO2  08/11/15 2230 181/96 mmHg - - (!) 58 16 100 %  08/11/15 2040 171/91 mmHg 98.7 F (37.1 C) Oral (!) 52 20 100 %  08/11/15 1828 185/99 mmHg 98.3 F (36.8 C) Oral 64 17 100 %   23:25- delta troponin is 0.01. At the time of discharge, it had not crossed over into the chart.  11:26 PM Reevaluation with update and discussion. After initial assessment and treatment, an updated evaluation reveals no further complaints. Findings discussed with the patient, all questions were answered. Shacola Schussler L    Labs Review Labs Reviewed  BASIC METABOLIC PANEL  CBC  I-STAT TROPOININ, ED  Rosezena Sensor, ED    Imaging Review Dg Chest 2 View  08/11/2015  CLINICAL DATA:  Chest pain since 530 tonight. EXAM: CHEST  2 VIEW COMPARISON:  04/13/2015 FINDINGS: The cardiac  silhouette, mediastinal and hilar contours are within normal limits. Mild tortuosity of the thoracic aorta. The lungs are clear. No pleural effusion. The bony thorax is intact. IMPRESSION: No acute cardiopulmonary findings. Electronically Signed   By: Rudie Meyer M.D.   On: 08/11/2015 18:57   I have personally reviewed and evaluated these images and lab results as part of my medical decision-making.   EKG Interpretation   Date/Time:  Monday Aug 11 2015 18:27:55 EDT Ventricular Rate:  62 PR Interval:  122 QRS Duration: 88 QT Interval:  402 QTC Calculation: 408 R Axis:   27 Text Interpretation:  Normal sinus rhythm with sinus arrhythmia Left  ventricular hypertrophy Abnormal ECG T wave abnormality, consider  inferolateral ischemia - old Since last tracing rate slower Otherwise no  significant change Confirmed by KNOTT MD, DANIEL [phone]) on 08/11/2015  6:31:17 PM  MDM   Final diagnoses:  Chest pain, unspecified chest pain type  Tobacco abuse    Nonspecific chest pain. Doubt ACS, PE or pneumonia. No indication for further treatment in the ED or hospital at this time.  Nursing Notes Reviewed/ Care Coordinated Applicable Imaging Reviewed Interpretation of Laboratory Data incorporated into ED treatment  The patient appears reasonably screened and/or stabilized for discharge and I doubt any other medical condition or other [facility] requiring further screening, evaluation, or treatment in the ED at this time prior to discharge.  Plan: Home Medications- usual; Home Treatments- rest, stop smoking; return here if the recommended treatment, does not improve the symptoms; Recommended follow up- PCP prn     Mancel Bale, MD 08/11/15 2328

## 2015-08-11 NOTE — ED Notes (Signed)
Troponin result 0.01

## 2015-08-12 LAB — I-STAT TROPONIN, ED: Troponin i, poc: 0.01 ng/mL (ref 0.00–0.08)

## 2015-08-13 ENCOUNTER — Emergency Department (HOSPITAL_COMMUNITY): Payer: Medicare Other

## 2015-08-13 ENCOUNTER — Emergency Department (HOSPITAL_COMMUNITY)
Admission: EM | Admit: 2015-08-13 | Discharge: 2015-08-13 | Disposition: A | Discharge status: Home / Self Care | Payer: Medicare Other | Attending: Emergency Medicine | Admitting: Emergency Medicine

## 2015-08-13 ENCOUNTER — Encounter (HOSPITAL_COMMUNITY): Payer: Self-pay | Admitting: Emergency Medicine

## 2015-08-13 DIAGNOSIS — G40219 Localization-related (focal) (partial) symptomatic epilepsy and epileptic syndromes with complex partial seizures, intractable, without status epilepticus: Secondary | ICD-10-CM | POA: Insufficient documentation

## 2015-08-13 DIAGNOSIS — Z9889 Other specified postprocedural states: Secondary | ICD-10-CM | POA: Insufficient documentation

## 2015-08-13 DIAGNOSIS — R29810 Facial weakness: Secondary | ICD-10-CM | POA: Diagnosis not present

## 2015-08-13 DIAGNOSIS — F17210 Nicotine dependence, cigarettes, uncomplicated: Secondary | ICD-10-CM | POA: Diagnosis not present

## 2015-08-13 DIAGNOSIS — I10 Essential (primary) hypertension: Secondary | ICD-10-CM | POA: Diagnosis not present

## 2015-08-13 DIAGNOSIS — I2510 Atherosclerotic heart disease of native coronary artery without angina pectoris: Secondary | ICD-10-CM | POA: Insufficient documentation

## 2015-08-13 DIAGNOSIS — R531 Weakness: Secondary | ICD-10-CM | POA: Diagnosis not present

## 2015-08-13 DIAGNOSIS — F1210 Cannabis abuse, uncomplicated: Secondary | ICD-10-CM | POA: Insufficient documentation

## 2015-08-13 DIAGNOSIS — E785 Hyperlipidemia, unspecified: Secondary | ICD-10-CM | POA: Diagnosis not present

## 2015-08-13 DIAGNOSIS — Z8673 Personal history of transient ischemic attack (TIA), and cerebral infarction without residual deficits: Secondary | ICD-10-CM | POA: Diagnosis not present

## 2015-08-13 DIAGNOSIS — Z79899 Other long term (current) drug therapy: Secondary | ICD-10-CM | POA: Diagnosis not present

## 2015-08-13 DIAGNOSIS — R404 Transient alteration of awareness: Secondary | ICD-10-CM | POA: Diagnosis not present

## 2015-08-13 DIAGNOSIS — R569 Unspecified convulsions: Secondary | ICD-10-CM

## 2015-08-13 LAB — COMPREHENSIVE METABOLIC PANEL
ALT: 28 U/L (ref 17–63)
ANION GAP: 20 — AB (ref 5–15)
AST: 31 U/L (ref 15–41)
Albumin: 4.2 g/dL (ref 3.5–5.0)
Alkaline Phosphatase: 39 U/L (ref 38–126)
BUN: 8 mg/dL (ref 6–20)
CO2: 13 mmol/L — ABNORMAL LOW (ref 22–32)
Calcium: 9.9 mg/dL (ref 8.9–10.3)
Chloride: 108 mmol/L (ref 101–111)
Creatinine, Ser: 1.14 mg/dL (ref 0.61–1.24)
GLUCOSE: 84 mg/dL (ref 65–99)
POTASSIUM: 4.1 mmol/L (ref 3.5–5.1)
Sodium: 141 mmol/L (ref 135–145)
TOTAL PROTEIN: 7.7 g/dL (ref 6.5–8.1)
Total Bilirubin: 0.5 mg/dL (ref 0.3–1.2)

## 2015-08-13 LAB — RAPID URINE DRUG SCREEN, HOSP PERFORMED
Amphetamines: NOT DETECTED
BARBITURATES: NOT DETECTED
Benzodiazepines: NOT DETECTED
Cocaine: NOT DETECTED
Opiates: NOT DETECTED
Tetrahydrocannabinol: POSITIVE — AB

## 2015-08-13 LAB — CBC WITH DIFFERENTIAL/PLATELET
Basophils Absolute: 0 10*3/uL (ref 0.0–0.1)
Basophils Relative: 0 %
EOS PCT: 1 %
Eosinophils Absolute: 0.1 10*3/uL (ref 0.0–0.7)
HCT: 42.4 % (ref 39.0–52.0)
Hemoglobin: 13.6 g/dL (ref 13.0–17.0)
LYMPHS ABS: 1.2 10*3/uL (ref 0.7–4.0)
LYMPHS PCT: 21 %
MCH: 26.8 pg (ref 26.0–34.0)
MCHC: 32.1 g/dL (ref 30.0–36.0)
MCV: 83.5 fL (ref 78.0–100.0)
MONO ABS: 0.3 10*3/uL (ref 0.1–1.0)
Monocytes Relative: 5 %
Neutro Abs: 4.2 10*3/uL (ref 1.7–7.7)
Neutrophils Relative %: 73 %
PLATELETS: 196 10*3/uL (ref 150–400)
RBC: 5.08 MIL/uL (ref 4.22–5.81)
RDW: 15.3 % (ref 11.5–15.5)
WBC: 5.9 10*3/uL (ref 4.0–10.5)

## 2015-08-13 LAB — ETHANOL: Alcohol, Ethyl (B): 7 mg/dL — ABNORMAL HIGH (ref <5)

## 2015-08-13 MED ORDER — LEVETIRACETAM 500 MG/5ML IV SOLN
1000.0000 mg | Freq: Once | INTRAVENOUS | Status: AC
  Administered 2015-08-13: 1000 mg via INTRAVENOUS
  Filled 2015-08-13: qty 10

## 2015-08-13 MED ORDER — LEVETIRACETAM 500 MG PO TABS: 500.0000 mg | ORAL_TABLET | Freq: Two times a day (BID) | ORAL | Status: DC

## 2015-08-13 NOTE — ED Notes (Signed)
sz pads to bed

## 2015-08-13 NOTE — ED Provider Notes (Signed)
Patient was accepted from Dr. Rubin Payor at sign out. Plan was to review CT and observe the patient for any recurrent seizure activity after administration of Keppra. Patient has remained seizure-free. He reports he feels back to baseline. He reports he feels comfortable going home. He is alert and interactive. At this time he'll be discharged with seizure instructions and prescription for Keppra as per Dr. Rubin Payor.  Arby Barrette, MD 08/13/15 1902

## 2015-08-13 NOTE — ED Notes (Signed)
Pt had big stroke last xmas eve and has left sided deficients  , today had witnessed shaking by a friend , pt states he was aware the whole time, no incontinence but felt really tired after per ems pt had high bp 200/13 and a ounding pulse of 140, pt states he has been taking, but ran out of one for 4-5 days his meds and denies drugs

## 2015-08-13 NOTE — Discharge Instructions (Signed)

## 2015-08-13 NOTE — ED Provider Notes (Signed)
CSN: [id number]     Arrival date & time 08/13/15  1309 History   First MD Initiated Contact with Patient 08/13/15 1321     Chief Complaint  Patient presents with  . Seizures     Patient is a 65 y.o. male presenting with seizures. The history is provided by the patient.  Seizures  patient presents after possible seizure. Had a previous intracranial hemorrhage with left-sided deficits. States today he was talking to a friend and began to fall to left side and had shaking of his left arm. No headache. Lasted a few minutes and stop. States he felt tired after. No headache. No confusion. He does drink alcohol but his been drinking less easily. States he does not drink everyday. States he would not withdrawal if he did stop drinking. Denies recent changes medications all told nursery been off His Medicines. Seen in the ER for Chest Pain 2 Days Ago. No Previous Seizure. No urinary incontinence.  Past Medical History  Diagnosis Date  . Hypertension   . Coronary artery disease     MI in 2008 with PCI to ? vessel; NSTEMI 03/2012; NSTEMI 04/2012 Cath showed triple vessel dz including occluded RCA, s/p DES to distal RCA, PTCA posterolateral branch   . CVA (cerebral infarction)     03/2012 with left arm weakness/numbness and left facial droop.  . Ischemic cardiomyopathy     04/2012 EF 35-40%  . Hyperlipidemia   . Claudication (HCC)   . Tobacco abuse     quit 04/06/12  . ETOH abuse     quit 04/06/12  . Stroke [facility])    Past Surgical History  Procedure Laterality Date  . Hernia repair    . Left heart catheterization with coronary angiogram N/A 04/11/2012    Procedure: LEFT HEART CATHETERIZATION WITH CORONARY ANGIOGRAM;  Surgeon: Kathleene Hazel, MD;  Location: [facility] CATH LAB;  Service: Cardiovascular;  Laterality: N/A;  . Percutaneous coronary stent intervention (pci-s)  04/11/2012    Procedure: PERCUTANEOUS CORONARY STENT INTERVENTION (PCI-S);  Surgeon: Kathleene Hazel, MD;  Location: [facility]  CATH LAB;  Service: Cardiovascular;;  Distal RCA  . Percutaneous coronary intervention-balloon only  04/11/2012    Procedure: PERCUTANEOUS CORONARY INTERVENTION-BALLOON ONLY;  Surgeon: Kathleene Hazel, MD;  Location: [facility] CATH LAB;  Service: Cardiovascular;;  RPLA   Family History  Problem Relation Age of Onset  . Cancer Mother   . Heart attack Father   . Hypertension Mother   . Hypertension Father   . Hypertension Sister   . Hypertension Brother   . Stroke Sister    Social History  Substance Use Topics  . Smoking status: Current Every Day Smoker -- 1.00 packs/day for 40 years    Types: Cigarettes  . Smokeless tobacco: Never Used  . Alcohol Use: 1.8 oz/week    3 Shots of liquor per week     Comment: a 40oz or 2 per day    Review of Systems  Constitutional: Negative for activity change and appetite change.  Eyes: Negative for pain.  Respiratory: Negative for chest tightness and shortness of breath.   Cardiovascular: Negative for chest pain and leg swelling.  Gastrointestinal: Negative for nausea, vomiting, abdominal pain and diarrhea.  Genitourinary: Negative for flank pain.  Musculoskeletal: Negative for back pain and neck stiffness.  Skin: Negative for rash.  Neurological: Positive for tremors and seizures. Negative for weakness, numbness and headaches.  Psychiatric/Behavioral: Negative for behavioral problems.      Allergies  Lisinopril  Home Medications   Prior to Admission medications   Medication Sig Start Date End Date Taking? Authorizing Provider  atorvastatin (LIPITOR) 80 MG tablet Take 80 mg by mouth every morning. 05/26/15  Yes Historical Provider, MD  carvedilol (COREG) 25 MG tablet Take 1 tablet (25 mg total) by mouth 2 (two) times daily with a meal. Patient taking differently: Take 25 mg by mouth every morning.  08/01/15  Yes Kathleene Hazel, MD  hydrALAZINE (APRESOLINE) 50 MG tablet Take 1 tablet (50 mg total) by mouth 3 (three) times daily.  06/24/15  Yes Kathleene Hazel, MD  losartan (COZAAR) 100 MG tablet Take 1 tablet (100 mg total) by mouth daily. 06/24/15  Yes Kathleene Hazel, MD  nitroGLYCERIN (NITROSTAT) 0.4 MG SL tablet Place 1 tablet (0.4 mg total) under the tongue every 5 (five) minutes as needed for chest pain (up to 3 doses). 04/14/12  Yes Jessica A Hope, PA-C  spironolactone (ALDACTONE) 25 MG tablet Take 1 tablet (25 mg total) by mouth daily. 08/01/15  Yes Kathleene Hazel, MD   BP 154/100 mmHg  Pulse 103  Resp 34  SpO2 99% Physical Exam  Constitutional: He appears well-developed.  HENT:  Left-sided facial droop.  Eyes: Pupils are equal, round, and reactive to light.  Neck: Neck supple.  Cardiovascular: Normal rate.   Pulmonary/Chest: Effort normal.  Abdominal: Soft. There is no tenderness.  Neurological: He is alert.  Mild left-sided facial droop. Does have grip strength in both upper extremities. Overall baseline movement of left upper extremity. Able wiggle both feet also.  Skin: Skin is warm.    ED Course  Procedures (including critical care time) Labs Review Labs Reviewed  COMPREHENSIVE METABOLIC PANEL - Abnormal; Notable for the following:    CO2 13 (*)    Anion gap 20 (*)    All other components within normal limits  ETHANOL - Abnormal; Notable for the following:    Alcohol, Ethyl (B) 7 (*)    All other components within normal limits  CBC WITH DIFFERENTIAL/PLATELET  URINE RAPID DRUG SCREEN, HOSP PERFORMED    Imaging Review Dg Chest 2 View  08/11/2015  CLINICAL DATA:  Chest pain since 530 tonight. EXAM: CHEST  2 VIEW COMPARISON:  04/13/2015 FINDINGS: The cardiac silhouette, mediastinal and hilar contours are within normal limits. Mild tortuosity of the thoracic aorta. The lungs are clear. No pleural effusion. The bony thorax is intact. IMPRESSION: No acute cardiopulmonary findings. Electronically Signed   By: Rudie Meyer M.D.   On: 08/11/2015 18:57   I have personally reviewed  and evaluated these images and lab results as part of my medical decision-making.   EKG Interpretation   Date/Time:  Wednesday Aug 13 2015 13:17:51 EDT Ventricular Rate:  117 PR Interval:  122 QRS Duration: 89 QT Interval:  318 QTC Calculation: 444 R Axis:   8 Text Interpretation:  Sinus tachycardia Probable left atrial enlargement  Abnormal R-wave progression, early transition Inferior infarct,  SINCE  LAST TRACING HEART RATE HAS INCREASED Confirmed by Rubin Payor  MD, Harrold Donath  [phone]) on 08/13/2015 1:22:11 PM      MDM   Final diagnoses:  Partial seizure (HCC)    Patient with likely partial seizure. Likely due either to previous stroke or or possibly mild alcohol withdrawal. Lab work shows somewhat low bicarbonate likely secondary to seizure. We'll continue to monitor and will discuss with neurology about antiepileptic choice.    Benjiman Core, MD 08/13/15 [phone]

## 2015-08-13 NOTE — ED Notes (Signed)
Pt given urinal for specimen.  

## 2015-08-14 ENCOUNTER — Telehealth: Payer: Self-pay | Admitting: Cardiovascular Disease

## 2015-08-14 NOTE — Telephone Encounter (Signed)
°*  STAT* If patient is at the pharmacy, call can be transferred to refill team.   1. Which medications need to be refilled? (please list name of each medication and dose if known) Spironolacpone 25 mg   2. Which pharmacy/location (including street and city if local pharmacy) is medication to be sent to?[facility] on [address]    3. Do they need a 30 day or 90 day supply? 90

## 2015-08-14 NOTE — Telephone Encounter (Signed)
This has already been sent in as below: Medication Detail      Disp Refills Start End     spironolactone (ALDACTONE) 25 MG tablet 90 tablet 4 08/01/2015     Sig - Route: Take 1 tablet (25 mg total) by mouth daily. 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

## 2015-08-15 ENCOUNTER — Ambulatory Visit (INDEPENDENT_AMBULATORY_CARE_PROVIDER_SITE_OTHER): Payer: Medicare Other | Admitting: Neurology

## 2015-08-15 ENCOUNTER — Encounter: Payer: Self-pay | Admitting: Neurology

## 2015-08-15 VITALS — BP 144/96 | HR 73 | Ht 65.0 in | Wt 143.6 lb

## 2015-08-15 DIAGNOSIS — Z8673 Personal history of transient ischemic attack (TIA), and cerebral infarction without residual deficits: Secondary | ICD-10-CM | POA: Insufficient documentation

## 2015-08-15 DIAGNOSIS — R002 Palpitations: Secondary | ICD-10-CM

## 2015-08-15 DIAGNOSIS — I10 Essential (primary) hypertension: Secondary | ICD-10-CM | POA: Diagnosis not present

## 2015-08-15 DIAGNOSIS — I63412 Cerebral infarction due to embolism of left middle cerebral artery: Secondary | ICD-10-CM | POA: Diagnosis not present

## 2015-08-15 DIAGNOSIS — E785 Hyperlipidemia, unspecified: Secondary | ICD-10-CM | POA: Diagnosis not present

## 2015-08-15 DIAGNOSIS — I638 Other cerebral infarction: Secondary | ICD-10-CM

## 2015-08-15 DIAGNOSIS — I255 Ischemic cardiomyopathy: Secondary | ICD-10-CM

## 2015-08-15 DIAGNOSIS — I6389 Other cerebral infarction: Secondary | ICD-10-CM

## 2015-08-15 MED ORDER — ASPIRIN EC 325 MG PO TBEC: 325.0000 mg | DELAYED_RELEASE_TABLET | Freq: Every day | ORAL | Status: DC

## 2015-08-15 NOTE — Progress Notes (Signed)
STROKE NEUROLOGY FOLLOW UP NOTE  NAME: Randall Patel DOB: 1950/06/20  REASON FOR VISIT: stroke follow up HISTORY FROM: pt and chart  Today we had the pleasure of seeing Randall Patel in follow-up at our Neurology Clinic. Pt was accompanied by no one.   History Summary Randall Patel is a 65 y.o. male with history of HTN, CAD with previous MI, previous CVA, ischemic cardiomyopathy, HLD, PVD, tobacco use, and alcohol abuse admitted on 04/04/15 for left hemiparesis and left facial droop. CT revealed large R frontoparietal hemorrhage. Started on scheduled mannitol and admitted to ICU. Mannitol changed to 3% with cerebral edema. Repeat CT stable. CUS unremarkable. TTE showed EF 35-40%. LDL 86 and A1C 6.4. BP gradually controlled. Continued improvement with discharge from ICU to IP rehab. Due to hx of stroke and low EF, concerning for hemorrhagic infarct. 30 day cardiac event monitoring recommended on discharge.  Hx of stroke  03/2012 R cortical motor strip infarcts  R frontal encephalomalacia  03/2015 likely right MCA infarct with hemorrhagic transformation  Interval History During the interval time, the patient has been doing well. He stayed in CIR for about 2 weeks and then d/c home. BP much better controlled, BP 144/96. On coreg, hydralazine, losartan, and spironolactone. He is also on lipitor for HLD. He has not quit smoking yet but willing to use patch after discussion with cardiology this month. Repeat MRI and MRA on 07/31/15 showed resolution of hemorrahage, but also showed right M1 stenosis progressed from before, which consistent with multiple right MCA infarcts in the past.   He had ED visit on 08/13/15 due to left arm shaking, lasting 10 sec. Denies LOC, leg shaking or facial twitching. EMS called and he was sent to ED. Head CT no acute abnormalities. He was put on keppra  bid and discharged home.  REVIEW OF SYSTEMS: Full 14 system review of systems performed and notable  only for those listed below and in HPI above, all others are negative:  Constitutional:   Cardiovascular:  Ear/Nose/Throat:   Skin:  Eyes:   Respiratory:   Gastroitestinal:   Genitourinary:  Hematology/Lymphatic:   Endocrine:  Musculoskeletal:   Allergy/Immunology:   Neurological:   Psychiatric:  Sleep:   The following represents the patient's updated allergies and side effects list: Allergies  Allergen Reactions  . Lisinopril Swelling and Other (See Comments)    angioedema    The neurologically relevant items on the patient's problem list were reviewed on today's visit.  Neurologic Examination  A problem focused neurological exam (12 or more points of the single system neurologic examination, vital signs counts as 1 point, cranial nerves count for 8 points) was performed.  Blood pressure 144/96, pulse 73, height  (1.651 m), weight 143 lb 9.6 oz (65.137 kg).  General - Well nourished, well developed, in no apparent distress.  Ophthalmologic - Fundi not visualized due to noncooperation.  Cardiovascular - Regular rate and rhythm with no murmur.  Mental Status -  Level of arousal and orientation to time, place, and person were intact. Language including expression, naming, repetition, comprehension was assessed and found intact. Fund of Knowledge was assessed and was intact.  Cranial Nerves II - XII - II - Visual field intact OU. III, IV, VI - Extraocular movements intact. V - Facial sensation intact bilaterally. VII - left facial droop. VIII - Hearing & vestibular intact bilaterally. X - Palate elevates symmetrically. XI - Chin turning & shoulder shrug intact bilaterally. XII - Tongue protrusion intact.  Motor Strength - The patient's strength was normal in all extremities except left UE 4/5 proxima and distal and left hand dexterity difficulty and pronator drift was present on the left.  Bulk was normal and fasciculations were absent.   Motor Tone - Muscle  tone was assessed at the neck and appendages and was normal.  Reflexes - The patient's reflexes were 1+ in all extremities and he had no pathological reflexes.  Sensory - Light touch, temperature/pinprick were assessed and were normal.    Coordination - The patient had normal movements in the hands and feet with no ataxia or dysmetria.  Tremor was absent.  Gait and Station - The patient's transfers, posture, gait, station, and turns were observed as normal.   Functional score  mRS = 2   0 - No symptoms.   1 - No significant disability. Able to carry out all usual activities, despite some symptoms.   2 - Slight disability. Able to look after own affairs without assistance, but unable to carry out all previous activities.   3 - Moderate disability. Requires some help, but able to walk unassisted.   4 - Moderately severe disability. Unable to attend to own bodily needs without assistance, and unable to walk unassisted.   5 - Severe disability. Requires constant nursing care and attention, bedridden, incontinent.   6 - Dead.   NIH Stroke Scale = 3  Level Of Consciousness 0=Alert; keenly responsive 1=Not alert, but arousable by minor stimulation 2=Not alert, requires repeated stimulation 3=Responds only with reflex movements 0  LOC Questions to Month and Age 65=Answers both questions correctly 1=Answers one question correctly 2=Answers neither question correctly 0  LOC Commands      -Open/Close eyes     -Open/close grip 0=Performs both tasks correctly 1=Performs one task correctly 2=Performs neighter task correctly 0  Best Gaze 0=Normal 1=Partial gaze palsy 2=Forced deviation, or total gaze paresis 0  Visual 0=No visual loss 1=Partial hemianopia 2=Complete hemianopia 3=Bilateral hemianopia (blind including cortical blindness) 0  Facial Palsy 0=Normal symmetrical movement 1=Minor paralysis (asymmetry) 2=Partial paralysis (lower face) 3=Complete paralysis (upper and  lower face) 2  Motor  0=No drift, limb holds posture for full 10 seconds 1=Drift, limb holds posture, no drift to bed 2=Some antigravity effort, cannot maintain posture, drifts to bed 3=No effort against gravity, limb falls 4=No movement Right Arm 0     Leg 0    Left Arm 1     Leg 0  Limb Ataxia 0=Absent 1=Present in one limb 2=Present in two limbs 0  Sensory 0=Normal 1=Mild to moderate sensory loss 2=Severe to total sensory loss 0  Best Language 0=No aphasia, normal 1=Mild to moderate aphasia 2=Mute, global aphasia 3=Mute, global aphasia 0  Dysarthria 0=Normal 1=Mild to moderate 2=Severe, unintelligible or mute/anarthric 0  Extinction/Neglect 0=No abnormality 1=Extinction to bilateral simultaneous stimulation 2=Profound neglect 0  Total   3     Data reviewed: I personally reviewed the images and agree with the radiology interpretations.  CT HEAD 04/09/2015 1. No significant change in large right frontal lobe intra-axial hemorrhage since 04/07/2015. Estimated hematoma volume 83 mL. 2. Mildly increased surrounding edema and regional mass effect. Leftward anterior midline shift now 6 mm, previously 3-4 mm. No ventriculomegaly and basilar cisterns remain patent. 3. Little to no extra-axial extension of blood (questionable trace subarachnoid extension on image 16 today). 4. No new intracranial abnormality. 04/07/2015 Evolving large RIGHT frontal intraparenchymal hematoma. Similar 3 mm RIGHT to LEFT midline shift without ventricular entrapment. However,  by my reading, pt midline shift has increased to 6mm, mass effect more prominent at superior frontal region.  04/05/2015: 1. Large RIGHT frontal intracranial hemorrhage is slightly contracted in volume. 2. Decrease in severity of mile leftward midline shift. 3. No new intracranial hemorrhage. 4. No extension to the ventricular system. 5. Basal cisterns are patent. 04/04/2015  Large acute right frontoparietal intracranial  hemorrhage, with evidence of active extravasation and 8 mm leftward midline shift. No evidence of downward herniation.   2D ECHO - Left ventricle: The cavity size was normal. Wall thickness wasincreased in a pattern of moderate LVH. Systolic function wasmildly to moderately reduced. The estimated ejection fraction wasin the range of 40% to 45%. There is akinesis of themid-apicalinferolateral myocardium. Doppler parameters areconsistent with abnormal left ventricular relaxation (grade 1 diastolic dysfunction). - Mitral valve: There was mild regurgitation. - Impressions: No cardiac source of emboli was indentified. Compared to theprior study, there has been no significant interval change.  Carotid Doppler 1-39% internal carotid artery stenosis bilaterally. Vertebral arteries are patent with antegrade flow.  RUE venous doppler Superificial Acute thrombus identified in the Right Cephalic Vein extends down to the forearm. No DVT   MRI brain 07/31/15 - 1. Subacute-chronic right frontal intracerebral hemorrhage (3.3x3.2x2.7cm; APxtransxSI). Compared to CT on 04/09/15 there is reduced size, mass effect, midline shift and edema. 2. No definite evidence of underlying mass or vascular malformation. Most likely represents chronic hemorrhagic infarction.   MRA head 07/31/15 - 1. The right middle cerebral artery M2 segment has high grade stenosis / tapering with decreased signal.  2. The left MCA and bilateral ACA have no stenosis.  3. The posterior circulation is normal. 4. Compared to MRI on 04/03/12, the right M2 segment of right MCA has increased high grade stenosis / tapering.  CT head without contrast -  1. No acute intracranial abnormality. 2. Encephalomalacia and likely gliosis involving the right frontal lobe at the site of prior hemorrhage. 3. Mild chronic bilateral maxillary and ethmoid sinus disease.  Component     Latest Ref Rng 04/07/2015  Cholesterol     0 - 200 mg/dL 161    Triglycerides     <150 mg/dL 67  HDL Cholesterol     >40 mg/dL 96  Total CHOL/HDL Ratio      1.9  VLDL     0 - 40 mg/dL 13  LDL (calc)     0 - 99 mg/dL 70  Hemoglobin W9U     4.8 - 5.6 % 6.1 (H)  Mean Plasma Glucose      128  TSH     0.350 - 4.500 uIU/mL 0.557  HIV     Non Reactive Non Reactive  RPR     Non Reactive Non Reactive  Vitamin B12     180 - 914 pg/mL 491    Assessment: As you may recall, he is a 65 y.o. African American male with PMH of HTN, CAD with previous MI, previous CVA, ischemic cardiomyopathy, HLD, PVD, tobacco use, and alcohol abuse admitted on 04/04/15 for large R frontoparietal hemorrhage. Treated with 3% saline for cerebral edema. Repeat CT stable. CUS unremarkable. TTE showed EF 35-40%. LDL 86 and A1C 6.4. BP gradually controlled. Continued improvement with discharge from ICU to IP rehab. During the interval time, has not quit smoking yet. Repeat MRI and MRA on 07/31/15 showed resolution of hemorrahage, but also showed right M1 stenosis progressed from before, which consistent with multiple right MCA infarcts in the past. His  ICH also considered as hemorrhagic infarcts. Recommend 30 day cardiac event monitoring. He had on 08/13/15 left arm shaking, lasting 10 sec, concerning for partial seizure. He was put on keppra 500mg  bid.  Plan:  - start ASA 325mg  daily for stroke prevention - continue lipitor for stroke prevention - check BP at home and record and bring over to PCP for medication adjustment if needed. goal 120-150 due to right M1 stenosis - continue keppra 500mg  bid for seizure control - follow up with cardiologist regularly - quit smoking - Follow up with your primary care physician for stroke risk factor modification. Recommend maintain blood pressure goal 120-150/70-90, diabetes with hemoglobin A1c goal below 6.5% and lipids with LDL cholesterol goal below 70 mg/dL.  - will arrange for 30 day cardiac monitoring to rule out afib - seizure  precautions - healthy diet and regular exercise - follow up in 3 months  I spent more than 25 minutes of face to face time with the patient. Greater than 50% of time was spent in counseling and coordination of care. We discussed seizure precautions, further stroke work up, quit smoking and BP management.    Orders Placed This Encounter  Procedures  . Cardiac event monitor    Standing Status: Future     Number of Occurrences:      Standing Expiration Date: 08/15/2016    Scheduling Instructions:     Request cardionet setup. Thank you.    Order Specific Question:  Where should this test be performed?    Answer:  [facility]    Meds ordered this encounter  Medications  . aspirin EC 325 MG tablet    Sig: Take 1 tablet (325 mg total) by mouth daily.    Dispense:  90 tablet    Refill:  3    Patient Instructions  - start ASA 325mg  daily for stroke prevention - continue lipitor for stroke prevention - check BP at home and record and bring over to PCP for medication adjustment if needed. goal 120-150 - continue keppra 500mg  bid for seizure control - follow up with cardiologist regularly - quit smoking - Follow up with your primary care physician for stroke risk factor modification. Recommend maintain blood pressure goal 120-150/70-90, diabetes with hemoglobin A1c goal below 6.5% and lipids with LDL cholesterol goal below 70 mg/dL.  - will arrange for 30 day cardiac monitoring to rule out afib - seizure precautions - healthy diet and regular exercise - follow up in 3 months   Marvel Plan, MD PhD [facility] [address], [address] [place], [place] [address] [phone]

## 2015-08-15 NOTE — Patient Instructions (Signed)
-   start ASA 325mg  daily for stroke prevention - continue lipitor for stroke prevention - check BP at home and record and bring over to PCP for medication adjustment if needed. goal 120-150 - continue keppra 500mg  bid for seizure control - follow up with cardiologist regularly - quit smoking - Follow up with your primary care physician for stroke risk factor modification. Recommend maintain blood pressure goal 120-150/70-90, diabetes with hemoglobin A1c goal below 6.5% and lipids with LDL cholesterol goal below 70 mg/dL.  - will arrange for 30 day cardiac monitoring to rule out afib - seizure precautions - healthy diet and regular exercise - follow up in 3 months

## 2015-08-22 ENCOUNTER — Ambulatory Visit (INDEPENDENT_AMBULATORY_CARE_PROVIDER_SITE_OTHER): Payer: Medicare Other

## 2015-08-22 ENCOUNTER — Telehealth: Payer: Self-pay | Admitting: Cardiology

## 2015-08-22 DIAGNOSIS — R002 Palpitations: Secondary | ICD-10-CM | POA: Diagnosis not present

## 2015-08-22 NOTE — Telephone Encounter (Addendum)
Received a page about critical event on patient's 30 day monitor. Manual input of syncope was triggered, with NSR noted on the EKG rates in the 60s. Company attempted to call patient with no answer. Company reports the device was placed today and this was probably triggered by mistake. I called the patients primary contact number and received a busy signal 3x. Called his brother, and was able to contact patient. Reports he triggered this by accident. No complaints.

## 2015-08-27 NOTE — Progress Notes (Signed)
Addendum 08/27/2015 4:28 PM Reviewed chart.  Patient seen in FU with Neuro (Dr. Roda Shutters) 08/15/15. Notes indicate FU MRI improved and patient was asked to start ASA 325 mg QD. Dr. Roda Shutters also arranged Event Monitor to r/o PAF. FU is planned next month with Dr. Verne Carrow.  Tereso Newcomer, PA-C   08/27/2015 4:29 PM

## 2015-09-03 ENCOUNTER — Telehealth: Payer: Self-pay | Admitting: Cardiovascular Disease

## 2015-09-03 NOTE — Telephone Encounter (Signed)
Walk in Pt form-[employer] Form Dropped off-Pat back Thursday 5/25

## 2015-09-05 NOTE — Telephone Encounter (Signed)
I spoke with pt and he answered questions for transportation application. Form completed  Form must be signed by pt before it can be faxed.  Pt reports he will come to office next week to sign.  I told him once signed we could fax in for him.  Form placed in medical records.

## 2015-09-09 ENCOUNTER — Telehealth: Payer: Self-pay | Admitting: Cardiovascular Disease

## 2015-09-09 NOTE — Telephone Encounter (Signed)
I have [employer] Form in red folder completed-waiting on patient to come in and sign his part.

## 2015-09-15 ENCOUNTER — Ambulatory Visit (INDEPENDENT_AMBULATORY_CARE_PROVIDER_SITE_OTHER): Payer: Medicare Other | Admitting: Cardiovascular Disease

## 2015-09-15 VITALS — BP 145/90 | HR 80 | Ht 65.0 in | Wt 145.8 lb

## 2015-09-15 DIAGNOSIS — I255 Ischemic cardiomyopathy: Secondary | ICD-10-CM

## 2015-09-15 DIAGNOSIS — E785 Hyperlipidemia, unspecified: Secondary | ICD-10-CM

## 2015-09-15 DIAGNOSIS — I2510 Atherosclerotic heart disease of native coronary artery without angina pectoris: Secondary | ICD-10-CM | POA: Diagnosis not present

## 2015-09-15 DIAGNOSIS — I10 Essential (primary) hypertension: Secondary | ICD-10-CM | POA: Diagnosis not present

## 2015-09-15 NOTE — Progress Notes (Signed)
Chief Complaint  Patient presents with  . Coronary Artery Disease  . essential hypertension     History of Present Illness: 65 yo AAM with history of CAD, CVA in 12/13 here today for cardiac follow up. He had his first MI in 2008. He was admitted with bradycardia, chest pain and taken to cath lab as urgent NSTEMI 04/11/12 and was found to have a totally occluded distal RCA. This was treated with a drug eluting stent in the mid to distal RCA and angioplasty of the Posterolateral segment. His LVEF post MI was 35-40%. He was admitted to [facility] December 2016 with right frontoparietal IC hemorrhage in the setting of uncontrolled HTN. Echo during that admission demonstrated Mod LVH, EF 40-45%, inf-lat AK, Gr 1 DD, mild MR. There was concern for right frontal encephalomalacia and concern for embolic infarcts. Outpatient event monitor planned. He was discharged to inpatient rehabilitation and seen in follow up in our office last in March 2017. Repeat imaging demonstrated resolution of IC hemorrhage. ASA started in Neurology clinic. He is now wearing an event monitor, due to finish June 12,2017.   He is here today for follow up. He tells me that he is feeling well. No exertional chest pain. No SOB.  Energy level is ok. He is still wearing the monitor.   Primary Care Physician: No PCP Per Patient   Past Medical History  Diagnosis Date  . Hypertension   . Coronary artery disease     MI in 2008 with PCI to ? vessel; NSTEMI 03/2012; NSTEMI 04/2012 Cath showed triple vessel dz including occluded RCA, s/p DES to distal RCA, PTCA posterolateral branch   . CVA (cerebral infarction)     03/2012 with left arm weakness/numbness and left facial droop.  . Ischemic cardiomyopathy     04/2012 EF 35-40%  . Hyperlipidemia   . Claudication (HCC)   . Tobacco abuse     quit 04/06/12  . ETOH abuse     quit 04/06/12  . Stroke (HCC)   . Seizures [facility])     Past Surgical History  Procedure Laterality Date  .  Hernia repair    . Left heart catheterization with coronary angiogram N/A 04/11/2012    Procedure: LEFT HEART CATHETERIZATION WITH CORONARY ANGIOGRAM;  Surgeon: Kathleene Hazel, MD;  Location: [facility] CATH LAB;  Service: Cardiovascular;  Laterality: N/A;  . Percutaneous coronary stent intervention (pci-s)  04/11/2012    Procedure: PERCUTANEOUS CORONARY STENT INTERVENTION (PCI-S);  Surgeon: Kathleene Hazel, MD;  Location: [facility] CATH LAB;  Service: Cardiovascular;;  Distal RCA  . Percutaneous coronary intervention-balloon only  04/11/2012    Procedure: PERCUTANEOUS CORONARY INTERVENTION-BALLOON ONLY;  Surgeon: Kathleene Hazel, MD;  Location: [facility] CATH LAB;  Service: Cardiovascular;;  RPLA    Current Outpatient Prescriptions  Medication Sig Dispense Refill  . aspirin EC 325 MG tablet Take 1 tablet (325 mg total) by mouth daily. 90 tablet 3  . atorvastatin (LIPITOR) 80 MG tablet Take 80 mg by mouth every morning.    . carvedilol (COREG) 25 MG tablet Take 25 mg by mouth every morning.    . hydrALAZINE (APRESOLINE) 50 MG tablet Take 1 tablet (50 mg total) by mouth 3 (three) times daily. 270 tablet 3  . levETIRAcetam (KEPPRA) 500 MG tablet Take 1 tablet (500 mg total) by mouth 2 (two) times daily. 60 tablet 0  . losartan (COZAAR) 100 MG tablet Take 1 tablet (100 mg total) by mouth daily. 90 tablet 3  .  nitroGLYCERIN (NITROSTAT) 0.4 MG SL tablet Place 1 tablet (0.4 mg total) under the tongue every 5 (five) minutes as needed for chest pain (up to 3 doses). 25 tablet 3  . spironolactone (ALDACTONE) 25 MG tablet Take 1 tablet (25 mg total) by mouth daily. 90 tablet 4   No current facility-administered medications for this visit.    Allergies  Allergen Reactions  . Lisinopril Swelling and Other (See Comments)    angioedema    Social History   Social History  . Marital Status: Single    Spouse Name: N/A  . Number of Children: N/A  . Years of Education: N/A   Occupational History    . Not on file.   Social History Main Topics  . Smoking status: Current Every Day Smoker -- 0.50 packs/day for 40 years    Types: Cigarettes  . Smokeless tobacco: Never Used     Comment: patient smoking 4 to 5 cigarettes a day  . Alcohol Use: 2.4 oz/week    3 Shots of liquor, 1 Cans of beer per week     Comment: natural lite drinks two to three per day  . Drug Use: Yes    Special: Marijuana  . Sexual Activity: Not on file   Other Topics Concern  . Not on file   Social History Narrative    Family History  Problem Relation Age of Onset  . Cancer Mother   . Heart attack Father   . Hypertension Mother   . Hypertension Father   . Hypertension Sister   . Hypertension Brother   . Stroke Sister     Review of Systems:  As stated in the HPI and otherwise negative.   BP 145/90 mmHg  Pulse 80  Ht  (1.651 m)  Wt 145 lb 12.8 oz (66.134 kg)  BMI 24.26 kg/m2  Physical Examination: General: Well developed, well nourished, NAD HEENT: OP clear, mucus membranes moist SKIN: warm, dry. No rashes. Neuro: No focal deficits Musculoskeletal: Muscle strength 5/5 all ext Psychiatric: Mood and affect normal Neck: No JVD, no carotid bruits, no thyromegaly, no lymphadenopathy. Lungs:Clear bilaterally, no wheezes, rhonci, crackles Cardiovascular: Regular rate and rhythm. No murmurs, gallops or rubs. Abdomen:Soft. Bowel sounds present. Non-tender.  Extremities: No lower extremity edema. Pulses are 2 + in the bilateral DP/PT.  Echo: 04/07/15: Left ventricle: The cavity size was normal. Wall thickness was  increased in a pattern of moderate LVH. Systolic function was  mildly to moderately reduced. The estimated ejection fraction was  in the range of 40% to 45%. There is akinesis of the  mid-apicalinferolateral myocardium. Doppler parameters are  consistent with abnormal left ventricular relaxation (grade 1  diastolic dysfunction). - Mitral valve: There was mild  regurgitation. Impressions: - No cardiac source of emboli was indentified. Compared to the  prior study, there has been no significant interval change.  Cardiac cath 04/11/12: Left main: Ostial 30% stenosis with heavy calcification.  Left Anterior Descending Artery: Large caliber vessel that courses to the apex. The proximal vessel has 30% stenosis. The mid vessel has a 60% stenosis at the takeoff of a septal perforating branch. Moderate caliber diagonal branch with mild plaque disease.  Circumflex Artery: Moderate caliber vessel. The proximal vessel has diffuse 30% stenosis. The first 2 obtuse marginal branches are very small. The third obtuse marginal branch is a small bifurcating vessel with diffuse 90% stenosis involving both branches. (1.75 mm vessel)  Right Coronary Artery: Large, dominant artery with 50% mid stenosis and  100% distal occlusion.  Left Ventricular Angiogram: LVEF=30-35% with akinesis of the inferior wall.  EKG:  EKG is not ordered today. The ekg ordered today demonstrates   Recent Labs: 04/07/2015: TSH 0.557 08/13/2015: ALT 28; BUN 8; Creatinine, Ser 1.14; Hemoglobin 13.6; Platelets 196; Potassium 4.1; Sodium 141   Lipid Panel    Component Value Date/Time   CHOL 179 04/07/2015 0203   TRIG 67 04/07/2015 0203   HDL 96 04/07/2015 0203   CHOLHDL 1.9 04/07/2015 0203   VLDL 13 04/07/2015 0203   LDLCALC 70 04/07/2015 0203     Wt Readings from Last 3 Encounters:  09/15/15 145 lb 12.8 oz (66.134 kg)  08/15/15 143 lb 9.6 oz (65.137 kg)  07/29/15 156 lb (70.761 kg)     Other studies Reviewed: Additional studies/ records that were reviewed today include: . Review of the above records demonstrates:    Assessment and Plan:   1. CAD: Stable. He is on ASA/statin/ARB, beta blocker. No recent chest pains.   2. Ischemic cardiomyopathy: LVEF 40-45% by echo December 2016. Will continue medical management.   3. Tobacco abuse: Smoking cessation recommended. He wishes to  stop  4. HTN: BP is borderline elevated. He is on maximal dose of his Coreg and Cozaar. No changes.  5. Hyperlipidemia: Continue statin. Lipids well controlled.   6. History of CVA: He is followed in Neurology. He is still wearing the monitor to exclude atrial fib. Will f/u on results and alert the patient.   Current medicines are reviewed at length with the patient today.  The patient does not have concerns regarding medicines.  The following changes have been made:  no change  Labs/ tests ordered today include:  No orders of the defined types were placed in this encounter.    Disposition:   FU with me in 6 months  Signed, Verne Carrow, MD 09/15/2015 3:47 PM    [facility] [address] [place], [place], [place]  [address] Phone: [phone]; Fax: [phone]

## 2015-09-15 NOTE — Patient Instructions (Signed)

## 2015-09-30 ENCOUNTER — Telehealth: Payer: Self-pay

## 2015-09-30 NOTE — Telephone Encounter (Signed)
Rn call patient about 30 day cardiac monitor. Rn stated 30 day monitor was negative for irregular heart beat. To continue treatment plan. Pt verbalized understanding.

## 2015-09-30 NOTE — Telephone Encounter (Signed)
-----   Message from Marvel Plan, MD sent at 09/29/2015  6:06 PM EDT ----- Could you please let the patient know that the 30 day cardiac monitoring test done recently was negative for irregular heart beat. Please continue current treatment. Thanks.  Marvel Plan, MD PhD Stroke Neurology 09/29/2015 6:06 PM

## 2015-10-10 DIAGNOSIS — F17211 Nicotine dependence, cigarettes, in remission: Secondary | ICD-10-CM | POA: Diagnosis not present

## 2015-10-10 DIAGNOSIS — Z6824 Body mass index (BMI) 24.0-24.9, adult: Secondary | ICD-10-CM | POA: Diagnosis not present

## 2015-10-10 DIAGNOSIS — Z8673 Personal history of transient ischemic attack (TIA), and cerebral infarction without residual deficits: Secondary | ICD-10-CM | POA: Diagnosis not present

## 2015-10-10 DIAGNOSIS — I252 Old myocardial infarction: Secondary | ICD-10-CM | POA: Diagnosis not present

## 2015-11-21 ENCOUNTER — Ambulatory Visit: Payer: Medicare Other | Admitting: Neurology

## 2015-12-05 ENCOUNTER — Ambulatory Visit: Payer: Medicare Other | Admitting: Neurology

## 2015-12-08 ENCOUNTER — Encounter: Payer: Self-pay | Admitting: Neurology

## 2015-12-26 DIAGNOSIS — I252 Old myocardial infarction: Secondary | ICD-10-CM | POA: Diagnosis not present

## 2015-12-26 DIAGNOSIS — Z8673 Personal history of transient ischemic attack (TIA), and cerebral infarction without residual deficits: Secondary | ICD-10-CM | POA: Diagnosis not present

## 2015-12-26 DIAGNOSIS — Z09 Encounter for follow-up examination after completed treatment for conditions other than malignant neoplasm: Secondary | ICD-10-CM | POA: Diagnosis not present

## 2016-01-21 DIAGNOSIS — Z8673 Personal history of transient ischemic attack (TIA), and cerebral infarction without residual deficits: Secondary | ICD-10-CM | POA: Diagnosis not present

## 2016-01-21 DIAGNOSIS — I252 Old myocardial infarction: Secondary | ICD-10-CM | POA: Diagnosis not present

## 2016-01-21 DIAGNOSIS — I10 Essential (primary) hypertension: Secondary | ICD-10-CM | POA: Diagnosis not present

## 2016-03-12 ENCOUNTER — Encounter: Payer: Self-pay | Admitting: Neurology

## 2016-03-12 ENCOUNTER — Ambulatory Visit (INDEPENDENT_AMBULATORY_CARE_PROVIDER_SITE_OTHER): Payer: Medicare HMO | Admitting: Neurology

## 2016-03-12 VITALS — BP 139/84 | HR 87 | Ht 65.0 in | Wt 148.4 lb

## 2016-03-12 DIAGNOSIS — I638 Other cerebral infarction: Secondary | ICD-10-CM | POA: Diagnosis not present

## 2016-03-12 DIAGNOSIS — I63412 Cerebral infarction due to embolism of left middle cerebral artery: Secondary | ICD-10-CM | POA: Diagnosis not present

## 2016-03-12 DIAGNOSIS — I10 Essential (primary) hypertension: Secondary | ICD-10-CM | POA: Diagnosis not present

## 2016-03-12 DIAGNOSIS — E785 Hyperlipidemia, unspecified: Secondary | ICD-10-CM | POA: Diagnosis not present

## 2016-03-12 DIAGNOSIS — I255 Ischemic cardiomyopathy: Secondary | ICD-10-CM

## 2016-03-12 DIAGNOSIS — I6389 Other cerebral infarction: Secondary | ICD-10-CM

## 2016-03-12 NOTE — Patient Instructions (Signed)
-   continue ASA 325mg  and lipitor for stroke prevention - check BP and record and bring over to PCP for medication adjustment if needed. goal 130-150 due to right M1 stenosis - continue keppra 500mg  bid for seizure control - continue to follow up with cardiologist - quit smoking - Follow up with your primary care physician for stroke risk factor modification. Recommend maintain blood pressure goal 120-150/70-90, diabetes with hemoglobin A1c goal below 6.5% and lipids with LDL cholesterol goal below 70 mg/dL.  - seizure precautions - healthy diet and regular exercise - follow up in 4 months

## 2016-03-12 NOTE — Progress Notes (Signed)
STROKE NEUROLOGY FOLLOW UP NOTE  NAME: Randall Patel DOB: 05-26-1950  REASON FOR VISIT: stroke follow up HISTORY FROM: pt and chart Today we had the pleasure of seeing Randall Patel in follow-up at our [facility]. Pt was accompanied by no one.   History Summary Randall Patel is a 65 y.o. male with history of HTN, CAD with previous MI, previous CVA, ischemic cardiomyopathy, HLD, PVD, tobacco use, and alcohol abuse admitted on 04/04/15 for left hemiparesis and left facial droop. CT revealed large R frontoparietal hemorrhage. Started on scheduled mannitol and admitted to ICU. Mannitol changed to 3% with cerebral edema. Repeat CT stable. CUS unremarkable. TTE showed EF 35-40%. LDL 86 and A1C 6.4. BP gradually controlled. Continued improvement with discharge from ICU to IP rehab. Due to hx of stroke and low EF, concerning for hemorrhagic infarct. 30 day cardiac event monitoring recommended on discharge.  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 He stayed in CIR for about 2 weeks and then d/c home. BP much better controlled, BP 144/96. On coreg, hydralazine, losartan, and spironolactone. He is also on lipitor for HLD. He has not quit smoking yet but willing to use patch after discussion with cardiology this month. Repeat MRI and MRA on 07/31/15 showed resolution of hemorrahage, but also showed right M1 stenosis progressed from before, which consistent with multiple right MCA infarcts in the past.   He had ED visit on 08/13/15 due to left arm shaking, lasting 10 sec. Denies LOC, leg shaking or facial twitching. EMS called and he was sent to ED. Head CT no acute abnormalities. He was put on keppra 500mg  bid and discharged home.  Interval History During the interval time, pt has been doing the same. No seizure or stroke like  symptoms. On ASA and lipitor and keppra. BP today 139/84. Has not followed up with cardiology yet for the low EF. Has not quit smoking yet, but cutting down.   REVIEW OF SYSTEMS: Full 14 system review of systems performed and notable only for those listed below and in HPI above, all others are negative:  Constitutional:   Cardiovascular:  Ear/Nose/Throat:   Skin:  Eyes:   Respiratory:   Gastroitestinal:   Genitourinary:  Hematology/Lymphatic:   Endocrine:  Musculoskeletal:   Allergy/Immunology:   Neurological:   Psychiatric:  Sleep:   The following represents the patient's updated allergies and side effects list: Allergies  Allergen Reactions  . Lisinopril Swelling and Other (See Comments)    angioedema    The neurologically relevant items on the patient's problem list were reviewed on today's visit.  Neurologic Examination  A problem focused neurological exam (12 or more points of the single system neurologic examination, vital signs counts as 1 point, cranial nerves count for 8 points) was performed.  Blood pressure 139/84, pulse 87, height 5\' 5"  (1.651 m), weight 148 lb 6.4 oz (67.3 kg).  General - Well nourished, well developed, in no apparent distress.  Ophthalmologic - Fundi not visualized due to noncooperation.  Cardiovascular - Regular rate and rhythm with no murmur.  Mental Status -  Level of arousal and orientation to time, place, and person were intact. Language including expression, naming, repetition, comprehension was assessed and found intact. Fund of Knowledge was assessed and was intact.  Cranial Nerves II - XII - II - Visual field intact  OU. III, IV, VI - Extraocular movements intact. V - Facial sensation intact bilaterally. VII - left facial droop. VIII - Hearing & vestibular intact bilaterally. X - Palate elevates symmetrically. XI - Chin turning & shoulder shrug intact bilaterally. XII - Tongue protrusion intact.  Motor Strength - The  patient's strength was normal in all extremities except left UE 4/5 distal and left hand dexterity difficulty.  Bulk was normal and fasciculations were absent.   Motor Tone - Muscle tone was assessed at the neck and appendages and was normal.  Reflexes - The patient's reflexes were 1+ in all extremities and he had no pathological reflexes.  Sensory - Light touch, temperature/pinprick were assessed and were normal.    Coordination - The patient had normal movements in the hands and feet with no ataxia or dysmetria.  Tremor was absent.  Gait and Station - The patient's transfers, posture, gait, station, and turns were observed as normal.   Data reviewed: I personally reviewed the images and agree with the radiology interpretations.  CT HEAD 04/09/2015 1. No significant change in large right frontal lobe intra-axial hemorrhage since 04/07/2015. Estimated hematoma volume 83 mL. 2. Mildly increased surrounding edema and regional mass effect. Leftward anterior midline shift now 6 mm, previously 3-4 mm. No ventriculomegaly and basilar cisterns remain patent. 3. Little to no extra-axial extension of blood (questionable trace subarachnoid extension on image 16 today). 4. No new intracranial abnormality. 04/07/2015 Evolving large RIGHT frontal intraparenchymal hematoma. Similar 3 mm RIGHT to LEFT midline shift without ventricular entrapment. However, by my reading, pt midline shift has increased to 61mm, mass effect more prominent at superior frontal region.  04/05/2015: 1. Large RIGHT frontal intracranial hemorrhage is slightly contracted in volume. 2. Decrease in severity of mile leftward midline shift. 3. No new intracranial hemorrhage. 4. No extension to the ventricular system. 5. Basal cisterns are patent. 04/04/2015  Large acute right frontoparietal intracranial hemorrhage, with evidence of active extravasation and 8 mm leftward midline shift. No evidence of downward herniation.   2D  ECHO - Left ventricle: The cavity size was normal. Wall thickness wasincreased in a pattern of moderate LVH. Systolic function wasmildly to moderately reduced. The estimated ejection fraction wasin the range of 40% to 45%. There is akinesis of themid-apicalinferolateral myocardium. Doppler parameters areconsistent with abnormal left ventricular relaxation (grade 1 diastolic dysfunction). - Mitral valve: There was mild regurgitation. - Impressions: No cardiac source of emboli was indentified. Compared to theprior study, there has been no significant interval change.  Carotid Doppler 1-39% internal carotid artery stenosis bilaterally. Vertebral arteries are patent with antegrade flow.  RUE venous doppler Superificial Acute thrombus identified in the Right Cephalic Vein extends down to the forearm. No DVT   MRI brain 07/31/15 - 1. Subacute-chronic right frontal intracerebral hemorrhage (3.3x3.2x2.7cm; APxtransxSI). Compared to CT on 04/09/15 there is reduced size, mass effect, midline shift and edema. 2. No definite evidence of underlying mass or vascular malformation. Most likely represents chronic hemorrhagic infarction.   MRA head 07/31/15 - 1. The right middle cerebral artery M2 segment has high grade stenosis / tapering with decreased signal.  2. The left MCA and bilateral ACA have no stenosis.  3. The posterior circulation is normal. 4. Compared to MRI on 04/03/12, the right M2 segment of right MCA has increased high grade stenosis / tapering.  CT head without contrast -  1. No acute intracranial abnormality. 2. Encephalomalacia and likely gliosis involving the right frontal lobe at the site of prior hemorrhage.  3. Mild chronic bilateral maxillary and ethmoid sinus disease.  Component     Latest Ref Rng 04/07/2015  Cholesterol     0 - 200 mg/dL 865179  Triglycerides     <150 mg/dL 67  HDL Cholesterol     >40 mg/dL 96  Total CHOL/HDL Ratio      1.9  VLDL     0 - 40 mg/dL  13  LDL (calc)     0 - 99 mg/dL 70  Hemoglobin H8IA1C     4.8 - 5.6 % 6.1 (H)  Mean Plasma Glucose      128  TSH     0.350 - 4.500 uIU/mL 0.557  HIV     Non Reactive Non Reactive  RPR     Non Reactive Non Reactive  Vitamin B12     180 - 914 pg/mL 491    Assessment: As you may recall, he is a 65 y.o. African American male with PMH of HTN, CAD with previous MI, previous CVA, ischemic cardiomyopathy, HLD, PVD, tobacco use, and alcohol abuse admitted on 04/04/15 for large R frontoparietal hemorrhage. Treated with 3% saline for cerebral edema. Repeat CT stable. CUS unremarkable. TTE showed EF 35-40%. LDL 86 and A1C 6.4. BP gradually controlled. Continued improvement with discharge from ICU to IP rehab. During the interval time, has not quit smoking yet. Repeat MRI and MRA on 07/31/15 showed resolution of hemorrahage, but also showed right M1 stenosis progressed from before, which consistent with multiple right MCA infarcts in the past. His ICH also considered as hemorrhagic infarcts. Recommend 30 day cardiac event monitoring. He had on 08/13/15 left arm shaking, lasting 10 sec, concerning for partial seizure. He was put on keppra 500mg  bid. Since then, no more seizure like activities. Still not quit smoking yet. Has not followed up with cardiology.  Plan:  - continue ASA 325mg  and lipitor for stroke prevention - check BP and record and bring over to PCP for medication adjustment if needed. goal 130-150 due to right M1 stenosis - continue keppra 500mg  bid for seizure control - follow up with cardiologist for low EF, continue coreg and losartan - quit smoking - Follow up with your primary care physician for stroke risk factor modification. Recommend maintain blood pressure goal 120-150/70-90, diabetes with hemoglobin A1c goal below 6.5% and lipids with LDL cholesterol goal below 70 mg/dL.  - seizure precautions - healthy diet and regular exercise - follow up in 4 months  I spent more than 25 minutes  of face to face time with the patient. Greater than 50% of time was spent in counseling and coordination of care. We discussed seizure precautions, cardiology follow up, quit smoking and BP management.    No orders of the defined types were placed in this encounter.   No orders of the defined types were placed in this encounter.   Patient Instructions  - continue ASA 325mg  and lipitor for stroke prevention - check BP and record and bring over to PCP for medication adjustment if needed. goal 130-150 due to right M1 stenosis - continue keppra 500mg  bid for seizure control - continue to follow up with cardiologist - quit smoking - Follow up with your primary care physician for stroke risk factor modification. Recommend maintain blood pressure goal 120-150/70-90, diabetes with hemoglobin A1c goal below 6.5% and lipids with LDL cholesterol goal below 70 mg/dL.  - seizure precautions - healthy diet and regular exercise - follow up in 4 months   Marvel PlanJindong Zabian Swayne,  MD PhD [facility] [address], [place], [place] [address] [phone]

## 2016-03-23 ENCOUNTER — Inpatient Hospital Stay (HOSPITAL_COMMUNITY): Payer: Medicare HMO

## 2016-03-23 ENCOUNTER — Inpatient Hospital Stay (HOSPITAL_COMMUNITY)
Admission: EM | Admit: 2016-03-23 | Discharge: 2016-03-31 | DRG: 280 | Disposition: A | Discharge status: Rehabilitation Facility | Payer: Medicare HMO | Attending: Internal Medicine | Admitting: Internal Medicine

## 2016-03-23 ENCOUNTER — Encounter (HOSPITAL_COMMUNITY): Payer: Self-pay | Admitting: Emergency Medicine

## 2016-03-23 ENCOUNTER — Emergency Department (HOSPITAL_COMMUNITY): Payer: Medicare HMO

## 2016-03-23 DIAGNOSIS — E875 Hyperkalemia: Secondary | ICD-10-CM | POA: Diagnosis present

## 2016-03-23 DIAGNOSIS — R042 Hemoptysis: Secondary | ICD-10-CM | POA: Diagnosis present

## 2016-03-23 DIAGNOSIS — I69931 Monoplegia of upper limb following unspecified cerebrovascular disease affecting right dominant side: Secondary | ICD-10-CM

## 2016-03-23 DIAGNOSIS — I10 Essential (primary) hypertension: Secondary | ICD-10-CM

## 2016-03-23 DIAGNOSIS — G931 Anoxic brain damage, not elsewhere classified: Secondary | ICD-10-CM | POA: Diagnosis present

## 2016-03-23 DIAGNOSIS — E440 Moderate protein-calorie malnutrition: Secondary | ICD-10-CM | POA: Diagnosis not present

## 2016-03-23 DIAGNOSIS — R1310 Dysphagia, unspecified: Secondary | ICD-10-CM | POA: Diagnosis present

## 2016-03-23 DIAGNOSIS — I25119 Atherosclerotic heart disease of native coronary artery with unspecified angina pectoris: Secondary | ICD-10-CM | POA: Diagnosis not present

## 2016-03-23 DIAGNOSIS — I69354 Hemiplegia and hemiparesis following cerebral infarction affecting left non-dominant side: Secondary | ICD-10-CM | POA: Diagnosis not present

## 2016-03-23 DIAGNOSIS — D72829 Elevated white blood cell count, unspecified: Secondary | ICD-10-CM

## 2016-03-23 DIAGNOSIS — E785 Hyperlipidemia, unspecified: Secondary | ICD-10-CM | POA: Diagnosis present

## 2016-03-23 DIAGNOSIS — I110 Hypertensive heart disease with heart failure: Secondary | ICD-10-CM | POA: Diagnosis present

## 2016-03-23 DIAGNOSIS — R571 Hypovolemic shock: Secondary | ICD-10-CM | POA: Diagnosis present

## 2016-03-23 DIAGNOSIS — J9600 Acute respiratory failure, unspecified whether with hypoxia or hypercapnia: Secondary | ICD-10-CM | POA: Diagnosis not present

## 2016-03-23 DIAGNOSIS — I6930 Unspecified sequelae of cerebral infarction: Secondary | ICD-10-CM | POA: Diagnosis not present

## 2016-03-23 DIAGNOSIS — I252 Old myocardial infarction: Secondary | ICD-10-CM

## 2016-03-23 DIAGNOSIS — N179 Acute kidney failure, unspecified: Secondary | ICD-10-CM | POA: Diagnosis present

## 2016-03-23 DIAGNOSIS — J9621 Acute and chronic respiratory failure with hypoxia: Secondary | ICD-10-CM | POA: Diagnosis not present

## 2016-03-23 DIAGNOSIS — J9601 Acute respiratory failure with hypoxia: Secondary | ICD-10-CM | POA: Diagnosis present

## 2016-03-23 DIAGNOSIS — R001 Bradycardia, unspecified: Secondary | ICD-10-CM | POA: Diagnosis present

## 2016-03-23 DIAGNOSIS — J810 Acute pulmonary edema: Secondary | ICD-10-CM | POA: Diagnosis not present

## 2016-03-23 DIAGNOSIS — I2510 Atherosclerotic heart disease of native coronary artery without angina pectoris: Secondary | ICD-10-CM

## 2016-03-23 DIAGNOSIS — Z9289 Personal history of other medical treatment: Secondary | ICD-10-CM

## 2016-03-23 DIAGNOSIS — I428 Other cardiomyopathies: Secondary | ICD-10-CM | POA: Diagnosis present

## 2016-03-23 DIAGNOSIS — R000 Tachycardia, unspecified: Secondary | ICD-10-CM | POA: Diagnosis not present

## 2016-03-23 DIAGNOSIS — I5021 Acute systolic (congestive) heart failure: Secondary | ICD-10-CM | POA: Diagnosis present

## 2016-03-23 DIAGNOSIS — E876 Hypokalemia: Secondary | ICD-10-CM | POA: Diagnosis not present

## 2016-03-23 DIAGNOSIS — R570 Cardiogenic shock: Secondary | ICD-10-CM | POA: Diagnosis not present

## 2016-03-23 DIAGNOSIS — I469 Cardiac arrest, cause unspecified: Secondary | ICD-10-CM | POA: Diagnosis present

## 2016-03-23 DIAGNOSIS — Z7982 Long term (current) use of aspirin: Secondary | ICD-10-CM

## 2016-03-23 DIAGNOSIS — R9089 Other abnormal findings on diagnostic imaging of central nervous system: Secondary | ICD-10-CM | POA: Diagnosis not present

## 2016-03-23 DIAGNOSIS — Z955 Presence of coronary angioplasty implant and graft: Secondary | ICD-10-CM

## 2016-03-23 DIAGNOSIS — R092 Respiratory arrest: Secondary | ICD-10-CM

## 2016-03-23 DIAGNOSIS — F1010 Alcohol abuse, uncomplicated: Secondary | ICD-10-CM

## 2016-03-23 DIAGNOSIS — R739 Hyperglycemia, unspecified: Secondary | ICD-10-CM

## 2016-03-23 DIAGNOSIS — I639 Cerebral infarction, unspecified: Secondary | ICD-10-CM

## 2016-03-23 DIAGNOSIS — F17210 Nicotine dependence, cigarettes, uncomplicated: Secondary | ICD-10-CM | POA: Diagnosis present

## 2016-03-23 DIAGNOSIS — D6489 Other specified anemias: Secondary | ICD-10-CM | POA: Diagnosis present

## 2016-03-23 DIAGNOSIS — J690 Pneumonitis due to inhalation of food and vomit: Secondary | ICD-10-CM | POA: Diagnosis not present

## 2016-03-23 DIAGNOSIS — Z8249 Family history of ischemic heart disease and other diseases of the circulatory system: Secondary | ICD-10-CM

## 2016-03-23 DIAGNOSIS — R0682 Tachypnea, not elsewhere classified: Secondary | ICD-10-CM

## 2016-03-23 DIAGNOSIS — I21A9 Other myocardial infarction type: Secondary | ICD-10-CM | POA: Diagnosis present

## 2016-03-23 DIAGNOSIS — F10230 Alcohol dependence with withdrawal, uncomplicated: Secondary | ICD-10-CM | POA: Diagnosis not present

## 2016-03-23 DIAGNOSIS — J449 Chronic obstructive pulmonary disease, unspecified: Secondary | ICD-10-CM

## 2016-03-23 DIAGNOSIS — E872 Acidosis: Secondary | ICD-10-CM | POA: Diagnosis present

## 2016-03-23 DIAGNOSIS — I6200 Nontraumatic subdural hemorrhage, unspecified: Secondary | ICD-10-CM | POA: Diagnosis not present

## 2016-03-23 DIAGNOSIS — I255 Ischemic cardiomyopathy: Secondary | ICD-10-CM | POA: Diagnosis not present

## 2016-03-23 DIAGNOSIS — I69391 Dysphagia following cerebral infarction: Secondary | ICD-10-CM

## 2016-03-23 DIAGNOSIS — Z9911 Dependence on respirator [ventilator] status: Secondary | ICD-10-CM

## 2016-03-23 DIAGNOSIS — G9340 Encephalopathy, unspecified: Secondary | ICD-10-CM

## 2016-03-23 DIAGNOSIS — J9602 Acute respiratory failure with hypercapnia: Secondary | ICD-10-CM | POA: Diagnosis present

## 2016-03-23 DIAGNOSIS — R52 Pain, unspecified: Secondary | ICD-10-CM

## 2016-03-23 DIAGNOSIS — J9690 Respiratory failure, unspecified, unspecified whether with hypoxia or hypercapnia: Secondary | ICD-10-CM

## 2016-03-23 HISTORY — DX: Unspecified convulsions: R56.9

## 2016-03-23 HISTORY — DX: Atherosclerotic heart disease of native coronary artery without angina pectoris: I25.10

## 2016-03-23 HISTORY — DX: Essential (primary) hypertension: I10

## 2016-03-23 HISTORY — DX: Other psychoactive substance dependence with withdrawal, unspecified: F19.239

## 2016-03-23 HISTORY — DX: Other psychoactive substance use, unspecified with withdrawal, unspecified: F19.939

## 2016-03-23 LAB — COMPREHENSIVE METABOLIC PANEL
ALBUMIN: 3.3 g/dL — AB (ref 3.5–5.0)
ALBUMIN: 3.4 g/dL — AB (ref 3.5–5.0)
ALT: 20 U/L (ref 17–63)
ALT: 26 U/L (ref 17–63)
AST: 26 U/L (ref 15–41)
AST: 36 U/L (ref 15–41)
Alkaline Phosphatase: 54 U/L (ref 38–126)
Alkaline Phosphatase: 43 U/L (ref 38–126)
Anion gap: 10 (ref 5–15)
Anion gap: 8 (ref 5–15)
BUN: 13 mg/dL (ref 6–20)
BUN: 16 mg/dL (ref 6–20)
CHLORIDE: 106 mmol/L (ref 101–111)
CHLORIDE: 110 mmol/L (ref 101–111)
CO2: 22 mmol/L (ref 22–32)
CO2: 21 mmol/L — AB (ref 22–32)
CREATININE: 1.39 mg/dL — AB (ref 0.61–1.24)
CREATININE: 1.13 mg/dL (ref 0.61–1.24)
Calcium: 8.5 mg/dL — ABNORMAL LOW (ref 8.9–10.3)
Calcium: 8.4 mg/dL — ABNORMAL LOW (ref 8.9–10.3)
GFR calc Af Amer: >60 mL/min (ref >60)
GFR calc non Af Amer: 52 mL/min — ABNORMAL LOW (ref >60)
GFR calc non Af Amer: >60 mL/min (ref >60)
GLUCOSE: 256 mg/dL — AB (ref 65–99)
GLUCOSE: 126 mg/dL — AB (ref 65–99)
POTASSIUM: 5.8 mmol/L — AB (ref 3.5–5.1)
Potassium: 5.1 mmol/L (ref 3.5–5.1)
SODIUM: 139 mmol/L (ref 135–145)
Sodium: 138 mmol/L (ref 135–145)
Total Bilirubin: 0.1 mg/dL — ABNORMAL LOW (ref 0.3–1.2)
Total Bilirubin: 0.8 mg/dL (ref 0.3–1.2)
Total Protein: 6.4 g/dL — ABNORMAL LOW (ref 6.5–8.1)
Total Protein: 6.3 g/dL — ABNORMAL LOW (ref 6.5–8.1)

## 2016-03-23 LAB — I-STAT ARTERIAL BLOOD GAS, ED
Acid-base deficit: 12 mmol/L — ABNORMAL HIGH (ref 0.0–2.0)
Acid-base deficit: 9 mmol/L — ABNORMAL HIGH (ref 0.0–2.0)
Acid-base deficit: 6 mmol/L — ABNORMAL HIGH (ref 0.0–2.0)
Bicarbonate: 18.9 mmol/L — ABNORMAL LOW (ref 20.0–28.0)
Bicarbonate: 21.6 mmol/L (ref 20.0–28.0)
Bicarbonate: 18.3 mmol/L — ABNORMAL LOW (ref 20.0–28.0)
O2 SAT: 79 %
O2 SAT: 98 %
O2 Saturation: 70 %
PCO2 ART: 64.9 mmHg — AB (ref 32.0–48.0)
PCO2 ART: 65.4 mmHg — AB (ref 32.0–48.0)
PH ART: 7.073 — AB (ref 7.350–7.450)
PO2 ART: 61 mmHg — AB (ref 83.0–108.0)
PO2 ART: 49 mmHg — AB (ref 83.0–108.0)
Patient temperature: 98.6
Patient temperature: 98.5
TCO2: 21 mmol/L (ref 0–100)
TCO2: 24 mmol/L (ref 0–100)
TCO2: 19 mmol/L (ref 0–100)
pCO2 arterial: 29.4 mmHg — ABNORMAL LOW (ref 32.0–48.0)
pH, Arterial: 7.127 — CL (ref 7.350–7.450)
pH, Arterial: 7.394 (ref 7.350–7.450)
pO2, Arterial: 98 mmHg (ref 83.0–108.0)

## 2016-03-23 LAB — PROTIME-INR
INR: 0.98
Prothrombin Time: 13 seconds (ref 11.4–15.2)

## 2016-03-23 LAB — TROPONIN I
TROPONIN I: 0.13 ng/mL — AB (ref <0.03)
Troponin I: 0.04 ng/mL (ref <0.03)
Troponin I: 0.21 ng/mL (ref <0.03)
Troponin I: 0.24 ng/mL (ref <0.03)

## 2016-03-23 LAB — GLUCOSE, CAPILLARY
Glucose-Capillary: 116 mg/dL — ABNORMAL HIGH (ref 65–99)
Glucose-Capillary: 126 mg/dL — ABNORMAL HIGH (ref 65–99)
Glucose-Capillary: 74 mg/dL (ref 65–99)
Glucose-Capillary: 80 mg/dL (ref 65–99)

## 2016-03-23 LAB — PROCALCITONIN: Procalcitonin: 0.47 ng/mL

## 2016-03-23 LAB — APTT: APTT: 20 s — AB (ref 24–36)

## 2016-03-23 LAB — I-STAT CHEM 8, ED
BUN: 20 mg/dL (ref 6–20)
CREATININE: 1.2 mg/dL (ref 0.61–1.24)
Calcium, Ion: 1.13 mmol/L — ABNORMAL LOW (ref 1.15–1.40)
Chloride: 106 mmol/L (ref 101–111)
Glucose, Bld: 243 mg/dL — ABNORMAL HIGH (ref 65–99)
HEMATOCRIT: 45 % (ref 39.0–52.0)
HEMOGLOBIN: 15.3 g/dL (ref 13.0–17.0)
POTASSIUM: 6 mmol/L — AB (ref 3.5–5.1)
SODIUM: 138 mmol/L (ref 135–145)
TCO2: 24 mmol/L (ref 0–100)

## 2016-03-23 LAB — CBC WITH DIFFERENTIAL/PLATELET
Basophils Absolute: 0 10*3/uL (ref 0.0–0.1)
Basophils Relative: 0 %
EOS ABS: 0.3 10*3/uL (ref 0.0–0.7)
EOS PCT: 2 %
HCT: 44.2 % (ref 39.0–52.0)
Hemoglobin: 14 g/dL (ref 13.0–17.0)
LYMPHS ABS: 1.9 10*3/uL (ref 0.7–4.0)
LYMPHS PCT: 18 %
MCH: 27.9 pg (ref 26.0–34.0)
MCHC: 31.7 g/dL (ref 30.0–36.0)
MCV: 88.2 fL (ref 78.0–100.0)
MONO ABS: 0.2 10*3/uL (ref 0.1–1.0)
MONOS PCT: 2 %
Neutro Abs: 8.2 10*3/uL — ABNORMAL HIGH (ref 1.7–7.7)
Neutrophils Relative %: 78 %
PLATELETS: 169 10*3/uL (ref 150–400)
RBC: 5.01 MIL/uL (ref 4.22–5.81)
RDW: 14.4 % (ref 11.5–15.5)
WBC: 10.6 10*3/uL — ABNORMAL HIGH (ref 4.0–10.5)

## 2016-03-23 LAB — CBC
HCT: 44.8 % (ref 39.0–52.0)
HEMOGLOBIN: 14.2 g/dL (ref 13.0–17.0)
MCH: 27.7 pg (ref 26.0–34.0)
MCHC: 31.7 g/dL (ref 30.0–36.0)
MCV: 87.3 fL (ref 78.0–100.0)
PLATELETS: 95 10*3/uL — AB (ref 150–400)
RBC: 5.13 MIL/uL (ref 4.22–5.81)
RDW: 14.7 % (ref 11.5–15.5)
WBC: 7 10*3/uL (ref 4.0–10.5)

## 2016-03-23 LAB — HEPATIC FUNCTION PANEL
ALK PHOS: 41 U/L (ref 38–126)
ALT: 23 U/L (ref 17–63)
AST: 27 U/L (ref 15–41)
Albumin: 3.4 g/dL — ABNORMAL LOW (ref 3.5–5.0)
BILIRUBIN DIRECT: 0.2 mg/dL (ref 0.1–0.5)
BILIRUBIN INDIRECT: 0.5 mg/dL (ref 0.3–0.9)
Total Bilirubin: 0.7 mg/dL (ref 0.3–1.2)
Total Protein: 6.6 g/dL (ref 6.5–8.1)

## 2016-03-23 LAB — TRIGLYCERIDES: Triglycerides: 97 mg/dL (ref <150)

## 2016-03-23 LAB — URINALYSIS, ROUTINE W REFLEX MICROSCOPIC
Bilirubin Urine: NEGATIVE
KETONES UR: NEGATIVE mg/dL
Leukocytes, UA: NEGATIVE
NITRITE: NEGATIVE
SQUAMOUS EPITHELIAL / LPF: NONE SEEN
Specific Gravity, Urine: 1.01 (ref 1.005–1.030)
pH: 5 (ref 5.0–8.0)

## 2016-03-23 LAB — PHOSPHORUS: PHOSPHORUS: 3.9 mg/dL (ref 2.5–4.6)

## 2016-03-23 LAB — RAPID URINE DRUG SCREEN, HOSP PERFORMED
Amphetamines: NOT DETECTED
Barbiturates: NOT DETECTED
Benzodiazepines: POSITIVE — AB
Cocaine: NOT DETECTED
Opiates: NOT DETECTED
Tetrahydrocannabinol: POSITIVE — AB

## 2016-03-23 LAB — COOXEMETRY PANEL
Carboxyhemoglobin: 1.3 % (ref 0.5–1.5)
Methemoglobin: 0.8 % (ref 0.0–1.5)
O2 SAT: 64.2 %
TOTAL HEMOGLOBIN: 13.2 g/dL (ref 12.0–16.0)

## 2016-03-23 LAB — MRSA PCR SCREENING: MRSA by PCR: NEGATIVE

## 2016-03-23 LAB — LACTIC ACID, PLASMA
Lactic Acid, Venous: 1.8 mmol/L (ref 0.5–1.9)
Lactic Acid, Venous: 1.8 mmol/L (ref 0.5–1.9)
Lactic Acid, Venous: 1.7 mmol/L (ref 0.5–1.9)

## 2016-03-23 LAB — LIPASE, BLOOD: LIPASE: 28 U/L (ref 11–51)

## 2016-03-23 LAB — AMYLASE: Amylase: 55 U/L (ref 28–100)

## 2016-03-23 LAB — STREP PNEUMONIAE URINARY ANTIGEN: Strep Pneumo Urinary Antigen: NEGATIVE

## 2016-03-23 LAB — BRAIN NATRIURETIC PEPTIDE: B Natriuretic Peptide: 540.2 pg/mL — ABNORMAL HIGH (ref 0.0–100.0)

## 2016-03-23 LAB — I-STAT TROPONIN, ED: TROPONIN I, POC: 0.04 ng/mL (ref 0.00–0.08)

## 2016-03-23 LAB — MAGNESIUM: Magnesium: 1.8 mg/dL (ref 1.7–2.4)

## 2016-03-23 LAB — CORTISOL: Cortisol, Plasma: 53 ug/dL

## 2016-03-23 LAB — I-STAT CG4 LACTIC ACID, ED: Lactic Acid, Venous: 4.66 mmol/L (ref 0.5–1.9)

## 2016-03-23 MED ORDER — STERILE WATER FOR INJECTION IJ SOLN
INTRAMUSCULAR | Status: AC
  Administered 2016-03-23: 08:00:00
  Filled 2016-03-23: qty 10

## 2016-03-23 MED ORDER — FOLIC ACID 5 MG/ML IJ SOLN
1.0000 mg | Freq: Every day | INTRAMUSCULAR | Status: DC
  Administered 2016-03-23 – 2016-03-28 (×6): 1 mg via INTRAVENOUS
  Filled 2016-03-23 (×7): qty 0.2

## 2016-03-23 MED ORDER — FENTANYL CITRATE (PF) 100 MCG/2ML IJ SOLN
INTRAMUSCULAR | Status: AC
  Filled 2016-03-23: qty 2

## 2016-03-23 MED ORDER — SODIUM CHLORIDE 0.9% FLUSH
10.0000 mL | INTRAVENOUS | Status: DC | PRN
  Administered 2016-03-23: 10 mL
  Filled 2016-03-23: qty 40

## 2016-03-23 MED ORDER — PROPOFOL 1000 MG/100ML IV EMUL
0.0000 ug/kg/min | INTRAVENOUS | Status: DC
  Administered 2016-03-23: 10 ug/kg/min via INTRAVENOUS
  Administered 2016-03-23: 20 ug/kg/min via INTRAVENOUS
  Filled 2016-03-23 (×2): qty 100

## 2016-03-23 MED ORDER — NOREPINEPHRINE BITARTRATE 1 MG/ML IV SOLN
0.0000 ug/min | Freq: Once | INTRAVENOUS | Status: DC
  Filled 2016-03-23: qty 4

## 2016-03-23 MED ORDER — PANTOPRAZOLE SODIUM 40 MG IV SOLR
40.0000 mg | Freq: Every day | INTRAVENOUS | Status: DC
  Administered 2016-03-23 – 2016-03-28 (×6): 40 mg via INTRAVENOUS
  Filled 2016-03-23 (×7): qty 40

## 2016-03-23 MED ORDER — MIDAZOLAM HCL 2 MG/2ML IJ SOLN
2.0000 mg | Freq: Once | INTRAMUSCULAR | Status: AC
  Administered 2016-03-23: 2 mg via INTRAVENOUS

## 2016-03-23 MED ORDER — THIAMINE HCL 100 MG/ML IJ SOLN
100.0000 mg | Freq: Every day | INTRAMUSCULAR | Status: DC
  Administered 2016-03-23 – 2016-03-28 (×6): 100 mg via INTRAVENOUS
  Filled 2016-03-23 (×6): qty 2

## 2016-03-23 MED ORDER — VANCOMYCIN HCL IN DEXTROSE 750-5 MG/150ML-% IV SOLN
750.0000 mg | Freq: Two times a day (BID) | INTRAVENOUS | Status: DC
  Administered 2016-03-24 – 2016-03-26 (×5): 750 mg via INTRAVENOUS
  Filled 2016-03-23 (×6): qty 150

## 2016-03-23 MED ORDER — FAMOTIDINE IN NACL 20-0.9 MG/50ML-% IV SOLN
20.0000 mg | Freq: Two times a day (BID) | INTRAVENOUS | Status: DC
  Administered 2016-03-23 – 2016-03-27 (×10): 20 mg via INTRAVENOUS
  Filled 2016-03-23 (×10): qty 50

## 2016-03-23 MED ORDER — VANCOMYCIN HCL 10 G IV SOLR
1500.0000 mg | Freq: Once | INTRAVENOUS | Status: AC
  Administered 2016-03-23: 1500 mg via INTRAVENOUS
  Filled 2016-03-23: qty 1500

## 2016-03-23 MED ORDER — VECURONIUM BROMIDE 10 MG IV SOLR
INTRAVENOUS | Status: AC
  Filled 2016-03-23: qty 10

## 2016-03-23 MED ORDER — FUROSEMIDE 10 MG/ML IJ SOLN
40.0000 mg | Freq: Once | INTRAMUSCULAR | Status: AC
  Administered 2016-03-23: 40 mg via INTRAVENOUS
  Filled 2016-03-23: qty 4

## 2016-03-23 MED ORDER — SODIUM CHLORIDE 0.9 % IV SOLN
25.0000 ug/h | INTRAVENOUS | Status: DC
  Administered 2016-03-23: 50 ug/h via INTRAVENOUS
  Administered 2016-03-23: 225 ug/h via INTRAVENOUS
  Administered 2016-03-24: 150 ug/h via INTRAVENOUS
  Administered 2016-03-24: 200 ug/h via INTRAVENOUS
  Administered 2016-03-26: 150 ug/h via INTRAVENOUS
  Filled 2016-03-23 (×7): qty 50

## 2016-03-23 MED ORDER — CHLORHEXIDINE GLUCONATE 0.12% ORAL RINSE (MEDLINE KIT)
15.0000 mL | Freq: Two times a day (BID) | OROMUCOSAL | Status: DC
  Administered 2016-03-23 – 2016-03-27 (×8): 15 mL via OROMUCOSAL

## 2016-03-23 MED ORDER — NOREPINEPHRINE BITARTRATE 1 MG/ML IV SOLN
5.0000 ug/min | INTRAVENOUS | Status: DC
  Administered 2016-03-23: 5 ug/min via INTRAVENOUS
  Filled 2016-03-23 (×2): qty 4

## 2016-03-23 MED ORDER — ACETAMINOPHEN 160 MG/5ML PO SOLN
650.0000 mg | Freq: Four times a day (QID) | ORAL | Status: DC | PRN
  Administered 2016-03-23 – 2016-03-29 (×3): 650 mg
  Filled 2016-03-23 (×3): qty 20.3

## 2016-03-23 MED ORDER — ORAL CARE MOUTH RINSE
15.0000 mL | OROMUCOSAL | Status: DC
  Administered 2016-03-23 – 2016-03-27 (×36): 15 mL via OROMUCOSAL

## 2016-03-23 MED ORDER — FENTANYL BOLUS VIA INFUSION
50.0000 ug | INTRAVENOUS | Status: DC | PRN
  Administered 2016-03-24 – 2016-03-27 (×3): 50 ug via INTRAVENOUS
  Filled 2016-03-23: qty 50

## 2016-03-23 MED ORDER — PROPOFOL 1000 MG/100ML IV EMUL
5.0000 ug/kg/min | Freq: Once | INTRAVENOUS | Status: DC
  Administered 2016-03-23: 10 ug/kg/min via INTRAVENOUS

## 2016-03-23 MED ORDER — INFLUENZA VAC SPLIT QUAD 0.5 ML IM SUSY: 0.5000 mL | PREFILLED_SYRINGE | INTRAMUSCULAR | Status: DC | PRN

## 2016-03-23 MED ORDER — SODIUM CHLORIDE 0.9 % IV SOLN
INTRAVENOUS | Status: DC
  Administered 2016-03-23 – 2016-03-28 (×2): via INTRAVENOUS

## 2016-03-23 MED ORDER — INSULIN ASPART 100 UNIT/ML ~~LOC~~ SOLN
0.0000 [IU] | SUBCUTANEOUS | Status: DC
  Administered 2016-03-23 – 2016-03-25 (×8): 1 [IU] via SUBCUTANEOUS
  Administered 2016-03-26 (×2): 2 [IU] via SUBCUTANEOUS
  Administered 2016-03-26 (×4): 1 [IU] via SUBCUTANEOUS
  Administered 2016-03-27 (×2): 2 [IU] via SUBCUTANEOUS
  Administered 2016-03-27 (×2): 1 [IU] via SUBCUTANEOUS
  Administered 2016-03-27: 2 [IU] via SUBCUTANEOUS
  Administered 2016-03-28 – 2016-03-29 (×4): 1 [IU] via SUBCUTANEOUS
  Administered 2016-03-29: 2 [IU] via SUBCUTANEOUS
  Administered 2016-03-30 (×2): 1 [IU] via SUBCUTANEOUS

## 2016-03-23 MED ORDER — PROPOFOL 1000 MG/100ML IV EMUL: 5.0000 ug/kg/min | Freq: Once | INTRAVENOUS | Status: DC

## 2016-03-23 MED ORDER — SODIUM CHLORIDE 0.9% FLUSH
10.0000 mL | Freq: Two times a day (BID) | INTRAVENOUS | Status: DC
  Administered 2016-03-23 – 2016-03-29 (×12): 10 mL
  Administered 2016-03-30: 20 mL

## 2016-03-23 MED ORDER — PNEUMOCOCCAL VAC POLYVALENT 25 MCG/0.5ML IJ INJ: 0.5000 mL | INJECTION | INTRAMUSCULAR | Status: DC | PRN

## 2016-03-23 MED ORDER — PROPOFOL 1000 MG/100ML IV EMUL
INTRAVENOUS | Status: AC
  Filled 2016-03-23: qty 100

## 2016-03-23 MED ORDER — ETOMIDATE 2 MG/ML IV SOLN
0.3000 mg/kg | Freq: Once | INTRAVENOUS | Status: DC
  Filled 2016-03-23: qty 20

## 2016-03-23 MED ORDER — IPRATROPIUM-ALBUTEROL 0.5-2.5 (3) MG/3ML IN SOLN
3.0000 mL | Freq: Four times a day (QID) | RESPIRATORY_TRACT | Status: DC
  Administered 2016-03-23 – 2016-03-27 (×17): 3 mL via RESPIRATORY_TRACT
  Filled 2016-03-23 (×17): qty 3

## 2016-03-23 MED ORDER — NITROGLYCERIN IN D5W 200-5 MCG/ML-% IV SOLN
0.0000 ug/min | Freq: Once | INTRAVENOUS | Status: AC
  Administered 2016-03-23: 5 ug/min via INTRAVENOUS
  Filled 2016-03-23: qty 250

## 2016-03-23 MED ORDER — PIPERACILLIN-TAZOBACTAM 3.375 G IVPB
3.3750 g | Freq: Three times a day (TID) | INTRAVENOUS | Status: DC
  Administered 2016-03-24 – 2016-03-26 (×7): 3.375 g via INTRAVENOUS
  Filled 2016-03-23 (×10): qty 50

## 2016-03-23 MED ORDER — VECURONIUM BROMIDE 10 MG IV SOLR
10.0000 mg | Freq: Once | INTRAVENOUS | Status: AC
  Administered 2016-03-23: 10 mg via INTRAVENOUS
  Filled 2016-03-23: qty 10

## 2016-03-23 MED ORDER — PIPERACILLIN-TAZOBACTAM 3.375 G IVPB 30 MIN
3.3750 g | Freq: Once | INTRAVENOUS | Status: AC
  Administered 2016-03-23: 3.375 g via INTRAVENOUS
  Filled 2016-03-23: qty 50

## 2016-03-23 MED ORDER — FENTANYL CITRATE (PF) 100 MCG/2ML IJ SOLN
50.0000 ug | Freq: Once | INTRAMUSCULAR | Status: DC
Start: 2016-03-23 — End: 2016-03-24

## 2016-03-23 MED ORDER — FENTANYL CITRATE (PF) 100 MCG/2ML IJ SOLN
100.0000 ug | Freq: Once | INTRAMUSCULAR | Status: AC
  Administered 2016-03-23: 100 ug via INTRAVENOUS

## 2016-03-23 NOTE — ED Provider Notes (Signed)
[facility] DEPT Provider Note   CSN: [id number] Arrival date & time: 03/23/16  0607     History   Chief Complaint Chief Complaint  Patient presents with  . Cardiac Arrest    ROSC    HPI Randall Patel is a 65 y.o. male.  HPI    65 year old male presents EMS status post cardiac arrest. Patient was found in the bathroom by family reporting you. A BNP respiratory distress. EMS was called, they found him in with the described as a bunch of blood. Patient went into PA, CPR was started immediately, one dose of epinephrine was given in the field return of pulses and sinus rhythm.    EMS intubated 7.02.  He was also notes there was a Public librarian under the patient that had blood on it. No signs of trauma.  Patient's family are poor historians per EMS no further information given at the time of arrival.     History reviewed. No pertinent past medical history.  Patient Active Problem List   Diagnosis Date Noted  . Cardiac arrest ([place]) 03/23/2016    History reviewed. No pertinent surgical history.     Home Medications    Prior to Admission medications   Not on File    Family History No family history on file.  Social History Social History  Substance Use Topics  . Smoking status: Unknown If Ever Smoked  . Smokeless tobacco: Not on file  . Alcohol use Not on file     Allergies   Patient has no known allergies.   Review of Systems Review of Systems  Unable to perform ROS: Intubated    Physical Exam Updated Vital Signs BP 116/88   Pulse 61   Temp (!) 101.5 F (38.6 C)   Resp (!) 32   Ht '5\' 6"'  (1.676 m)   Wt 80 kg   SpO2 100%   BMI 28.47 kg/m   Physical Exam  Constitutional:  Intubated, agonal respirations- bloody secretions from ET tube   HENT:  Head: Atraumatic.  Eyes: Pupils are equal, round, and reactive to light.  Disconjugate gaze  Cardiovascular: Normal rate.   Pulmonary/Chest:  Diffuse crackles  Abdominal: Soft. He exhibits  no distension.  Musculoskeletal: He exhibits no edema.  Skin: Skin is warm.     ED Treatments / Results  Labs (all labs ordered are listed, but only abnormal results are displayed) Labs Reviewed  COMPREHENSIVE METABOLIC PANEL - Abnormal; Notable for the following:       Result Value   Potassium 5.8 (*)    Glucose, Bld 256 (*)    Creatinine, Ser 1.39 (*)    Calcium 8.5 (*)    Total Protein 6.4 (*)    Albumin 3.3 (*)    Total Bilirubin 0.1 (*)    GFR calc non Af Amer 52 (*)    All other components within normal limits  CBC WITH DIFFERENTIAL/PLATELET - Abnormal; Notable for the following:    WBC 10.6 (*)    Neutro Abs 8.2 (*)    All other components within normal limits  TROPONIN I - Abnormal; Notable for the following:    Troponin I 0.04 (*)    All other components within normal limits  BRAIN NATRIURETIC PEPTIDE - Abnormal; Notable for the following:    B Natriuretic Peptide 540.2 (*)    All other components within normal limits  URINALYSIS, ROUTINE W REFLEX MICROSCOPIC - Abnormal; Notable for the following:    APPearance HAZY (*)  Glucose, UA >=500 (*)    Hgb urine dipstick SMALL (*)    Protein, ur >=300 (*)    Bacteria, UA FEW (*)    All other components within normal limits  RAPID URINE DRUG SCREEN, HOSP PERFORMED - Abnormal; Notable for the following:    Benzodiazepines POSITIVE (*)    Tetrahydrocannabinol POSITIVE (*)    All other components within normal limits  COMPREHENSIVE METABOLIC PANEL - Abnormal; Notable for the following:    CO2 21 (*)    Glucose, Bld 126 (*)    Calcium 8.4 (*)    Total Protein 6.3 (*)    Albumin 3.4 (*)    All other components within normal limits  CBC - Abnormal; Notable for the following:    Platelets 95 (*)    All other components within normal limits  APTT - Abnormal; Notable for the following:    aPTT 20 (*)    All other components within normal limits  TROPONIN I - Abnormal; Notable for the following:    Troponin I 0.13 (*)     All other components within normal limits  HEPATIC FUNCTION PANEL - Abnormal; Notable for the following:    Albumin 3.4 (*)    All other components within normal limits  GLUCOSE, CAPILLARY - Abnormal; Notable for the following:    Glucose-Capillary 116 (*)    All other components within normal limits  TROPONIN I - Abnormal; Notable for the following:    Troponin I 0.21 (*)    All other components within normal limits  I-STAT CHEM 8, ED - Abnormal; Notable for the following:    Potassium 6.0 (*)    Glucose, Bld 243 (*)    Calcium, Ion 1.13 (*)    All other components within normal limits  I-STAT ARTERIAL BLOOD GAS, ED - Abnormal; Notable for the following:    pH, Arterial 7.073 (*)    pCO2 arterial 64.9 (*)    pO2, Arterial 61.0 (*)    Bicarbonate 18.9 (*)    Acid-base deficit 12.0 (*)    All other components within normal limits  I-STAT CG4 LACTIC ACID, ED - Abnormal; Notable for the following:    Lactic Acid, Venous 4.66 (*)    All other components within normal limits  I-STAT ARTERIAL BLOOD GAS, ED - Abnormal; Notable for the following:    pH, Arterial 7.127 (*)    pCO2 arterial 65.4 (*)    pO2, Arterial 49.0 (*)    Acid-base deficit 9.0 (*)    All other components within normal limits  I-STAT ARTERIAL BLOOD GAS, ED - Abnormal; Notable for the following:    pCO2 arterial 29.4 (*)    Bicarbonate 18.3 (*)    Acid-base deficit 6.0 (*)    All other components within normal limits  MRSA PCR SCREENING  CULTURE, BLOOD (ROUTINE X 2)  CULTURE, BLOOD (ROUTINE X 2)  URINE CULTURE  CULTURE, RESPIRATORY (NON-EXPECTORATED)  MAGNESIUM  PHOSPHORUS  PROCALCITONIN  CORTISOL  PROTIME-INR  STREP PNEUMONIAE URINARY ANTIGEN  AMYLASE  LIPASE, BLOOD  LACTIC ACID, PLASMA  COOXEMETRY PANEL  TRIGLYCERIDES  LACTIC ACID, PLASMA  LACTIC ACID, PLASMA  GLUCOSE, CAPILLARY  URINE DRUGS OF ABUSE SCREEN W ALC, ROUTINE (REF LAB)  TROPONIN I  CBC  BASIC METABOLIC PANEL  BLOOD GAS, ARTERIAL   PROCALCITONIN  I-STAT TROPOININ, ED    EKG  EKG Interpretation  Date/Time:  Tuesday March 23 2016 07:01:07 EST Ventricular Rate:  99 PR Interval:    QRS  Duration: 99 QT Interval:  352 QTC Calculation: 450 R Axis:   35 Text Interpretation:  Sinus rhythm Left atrial enlargement Posterior infarct, old Artifact in lead(s) II III aVR aVL aVF V4 V5 V6 No prior for comparison Confirmed by HORTON  MD, COURTNEY ([id number]) on 03/23/2016 7:06:33 AM       Radiology Ct Head Wo Contrast  Result Date: 03/23/2016 CLINICAL DATA:  Fall earlier today.  Episode of cardiac arrest EXAM: CT HEAD WITHOUT CONTRAST TECHNIQUE: Contiguous axial images were obtained from the base of the skull through the vertex without intravenous contrast. COMPARISON:  None. FINDINGS: Brain: There is mild diffuse atrophy. There is a prior infarct in the anteromedial right frontal lobe. There is decreased attenuation in the mid superior right frontal lobe with foci of hemorrhage within this lesion, likely acute partially hemorrhagic infarct. Infarct extends more inferiorly the level of the right internal capsule. Elsewhere gray-white compartments appear normal. There is no midline shift or extra-axial fluid collection. Vascular: There is no appreciable hyperdense vessel. There is calcification in each carotid siphon region. Skull: The bony calvarium appears intact. A defect in the medial left orbital wall may be congenital or residua of prior trauma. Sinuses/Orbits: There is opacification in multiple ethmoid air cells bilaterally. There is mucosal thickening in both maxillary antra, more severe on the right than on the left. There is opacification with air-fluid levels in each sphenoid sinus. There is leftward deviation of the nasal septum. Orbits appear symmetric bilaterally. Other: Mastoid air cells are clear. IMPRESSION: Evidence indicating acute partially hemorrhagic infarct in the right frontal lobe mid to posterior aspect. Old  infarct more anteriorly in the right frontal lobe. Mild underlying atrophy. Areas of paranasal sinus disease at multiple sites. Areas of arterial vascular calcification noted. Critical Value/emergent results were called by telephone at the time of interpretation on 03/23/2016 at 9:06 am to Dr. Idolina Primer, ED physician , who verbally acknowledged these results. Electronically Signed   By: Lowella Grip III M.D.   On: 03/23/2016 09:06   Dg Chest Portable 1 View  Result Date: 03/23/2016 CLINICAL DATA:  Central line placement EXAM: PORTABLE CHEST 1 VIEW COMPARISON:  03/23/2016 FINDINGS: Left central line has been placed with the tip in the SVC. No pneumothorax. Bilateral severe airspace disease, right greater than left, unchanged. Endotracheal tube and NG tube are in stable position. Heart is normal size. IMPRESSION: Left central line tip in the SVC.  No pneumothorax. Stable severe bilateral airspace disease. Electronically Signed   By: Rolm Baptise M.D.   On: 03/23/2016 08:29   Dg Chest Port 1 View  Result Date: 03/23/2016 CLINICAL DATA:  65 year old male with respiratory distress. Status post CPR and intubation. EXAM: PORTABLE CHEST 1 VIEW COMPARISON:  None. FINDINGS: Endotracheal tube with tip approximately 7.7 cm above the carina. Diffuse bilateral airspace opacities noted which may represent multifocal pneumonia, ARDS, alveolar hemorrhage. There is no pleural effusion or pneumothorax. There is mild cardiomegaly. No acute osseous pathology identified. IMPRESSION: Endotracheal tube above the carina. Diffuse bilateral airspace opacities. Cardiomegaly. Electronically Signed   By: Anner Crete M.D.   On: 03/23/2016 06:33    Procedures Procedures (including critical care time)  CRITICAL CARE Performed by: Elmer Ramp   Total critical care time: 35 minutes  Critical care time was exclusive of separately billable procedures and treating other patients.  Critical care was necessary to  treat or prevent imminent or life-threatening deterioration.  Critical care was time spent personally by me on the following  activities: development of treatment plan with patient and/or surrogate as well as nursing, discussions with consultants, evaluation of patient's response to treatment, examination of patient, obtaining history from patient or surrogate, ordering and performing treatments and interventions, ordering and review of laboratory studies, ordering and review of radiographic studies, pulse oximetry and re-evaluation of patient's condition.  Medications Ordered in ED Medications  pantoprazole (PROTONIX) injection 40 mg (not administered)  famotidine (PEPCID) IVPB 20 mg premix (20 mg Intravenous Given 03/23/16 1236)  0.9 %  sodium chloride infusion ( Intravenous New Bag/Given 03/23/16 1130)  ipratropium-albuterol (DUONEB) 0.5-2.5 (3) MG/3ML nebulizer solution 3 mL (3 mLs Nebulization Given 03/23/16 1350)  norepinephrine (LEVOPHED) 4 mg in dextrose 5 % 250 mL (0.016 mg/mL) infusion (2 mcg/min Intravenous Rate/Dose Change 03/23/16 1800)  fentaNYL (SUBLIMAZE) injection 50 mcg (50 mcg Intravenous Not Given 03/23/16 0830)  fentaNYL (SUBLIMAZE) 2,500 mcg in sodium chloride 0.9 % 250 mL (10 mcg/mL) infusion (100 mcg/hr Intravenous Rate/Dose Change 03/23/16 1130)  fentaNYL (SUBLIMAZE) bolus via infusion 50 mcg (not administered)  propofol (DIPRIVAN) 1000 MG/100ML infusion (20 mcg/kg/min  80 kg Intravenous New Bag/Given 03/23/16 1607)  insulin aspart (novoLOG) injection 0-9 Units (0 Units Subcutaneous Not Given 03/23/16 1817)  thiamine (B-1) injection 100 mg (100 mg Intravenous Given 32/95/18 8416)  folic acid injection 1 mg (1 mg Intravenous Given 03/23/16 1236)  sodium chloride flush (NS) 0.9 % injection 10-40 mL (0 mLs Intracatheter Duplicate 60/63/01 6010)  sodium chloride flush (NS) 0.9 % injection 10-40 mL (10 mLs Intracatheter Given 03/23/16 1438)  chlorhexidine gluconate (MEDLINE  KIT) (PERIDEX) 0.12 % solution 15 mL (not administered)  MEDLINE mouth rinse (15 mLs Mouth Rinse Given 03/23/16 1800)  Influenza vac split quadrivalent PF (FLUARIX) injection 0.5 mL (not administered)  pneumococcal 23 valent vaccine (PNU-IMMUNE) injection 0.5 mL (not administered)  acetaminophen (TYLENOL) solution 650 mg (not administered)  vancomycin (VANCOCIN) 1,500 mg in sodium chloride 0.9 % 500 mL IVPB (not administered)  piperacillin-tazobactam (ZOSYN) IVPB 3.375 g (not administered)  midazolam (VERSED) injection 2 mg (2 mg Intravenous Given 03/23/16 0619)  nitroGLYCERIN 50 mg in dextrose 5 % 250 mL (0.2 mg/mL) infusion (0 mcg/min Intravenous Stopped 03/23/16 0723)  fentaNYL (SUBLIMAZE) injection 100 mcg (100 mcg Intravenous Given 03/23/16 0720)  vecuronium (NORCURON) injection 10 mg (10 mg Intravenous Given 03/23/16 0739)  sterile water (preservative free) injection (  Given 03/23/16 0730)  furosemide (LASIX) injection 40 mg (40 mg Intravenous Given 03/23/16 0914)    Initial Impression / Assessment and Plan / ED Course  I have reviewed the triage vital signs and the nursing notes.  Pertinent labs & imaging results that were available during my care of the patient were reviewed by me and considered in my medical decision making (see chart for details).  Clinical Course     Final Clinical Impressions(s) / ED Diagnoses   Final diagnoses:  Respiratory arrest ([place])  Acute pulmonary edema ([facility])    Labs: I-STAT Chem-8, i-STAT lactic acid, i-STAT troponin , urinalysis, CMP, CBC, , BMP blood gas  Consults: Critical care  Therapeutics: Propofol, nitroglycerin  Discharge Meds:   Assessment/Plan:  65 year old male presents today status post cardiac arrest. Patient's symptoms are consistent with respiratory arrest secondary to pulmonary edema. Patient has history of stroke, and originally we were unable to obtain significant information. Head CT ordered, critical care consult at.  Critical care to admit the patient.       New Prescriptions There are no discharge medications for this patient.  Okey Regal, PA-C 03/23/16 1901    Merryl Hacker, MD 03/23/16 [phone]

## 2016-03-23 NOTE — Consult Note (Signed)
Neurology Consultation Reason for Consult: Cardiac arrest Referring Physician: Craige Cotta  CC: Unresponsiveness  History is obtained from: Chart  HPI: Eagle Kilduff is a 65 y.o. male with a history of a stroke in the right frontal region to was found down today. He was unresponsive but breathing. EMS was activated, he had PA arrest en route. Following his arrest, a CT was performed which showed possible acute stroke adjacent to his chronic infarct. Neurology was consulted due to this.   LKW: unclear tpa given?: no, unclear time of onset    ROS:   Unable to obtain due to altered mental status.   PMH: Unable to assess secondary to patient's altered mental status.     GBT:DVVOHY to assess secondary to patient's altered mental status.    Social History:  has no tobacco, alcohol, and drug history on file.   Exam: Current vital signs: BP 106/74   Pulse 65   Temp 97.4 F (36.3 C) (Oral)   Resp (!) 32   Ht 5\' 6"  (1.676 m)   Wt 80 kg (176 lb 5.9 oz)   SpO2 100%   BMI 28.47 kg/m  Vital signs in last 24 hours: Temp:  [95 F (35 C)-98.6 F (37 C)] 97.4 F (36.3 C) (12/12 1147) Pulse Rate:  [30-101] 65 (12/12 1300) Resp:  [21-46] 32 (12/12 1300) BP: (79-225)/(63-133) 106/74 (12/12 1300) SpO2:  [54 %-100 %] 100 % (12/12 1354) FiO2 (%):  [100 %] 100 % (12/12 1354) Weight:  [80 kg (176 lb 5.9 oz)] 80 kg (176 lb 5.9 oz) (12/12 0737)   Physical Exam  Constitutional: Appears well-developed and well-nourished.  Psych: Affect appropriate to situation Eyes: No scleral injection HENT: ET tube in place.  Head: Normocephalic.  Cardiovascular: Normal rate and regular rhythm.  Respiratory: Effort normal GI: Soft.  No distension. There is no tenderness.  Skin: WDI  Neuro: Mental Status: Patient is obtunded, but did seem to move head slightly in response to noxious stimulus.  Cranial Nerves: II: Does not blink to threat.  Pupils are equal, round, and reactive to light.   III,IV,  VI: Eyes dysconjugate V,VII: corneals intact X: Cough intact Motor: He has flexion noxious stimuli in bilateral lower extremities, possible mild flexion of the right upper extremity, no response in left upper Sensory: As above Cerebellar: Does not perform  I have reviewed labs in epic and the results pertinent to this consultation are: CMP-relatively unremarkable  I have reviewed the images obtained: CT head-mild hypodensity around the chronic infarct. I feel this is age indeterminate, but potentially does represent acute infarct.  Impression: 65 year old male with likely anoxic brain injury, it is possible that he had impaired perfusion in this area, and then with a subsequent arrest had this compounded then infarcted this area due to that.  Recommendations: 1) MRI brain 2) EEG to assess his prognosis 3) neurology will continue to follow   Ritta Slot, MD [facility] [phone]  If 7pm- 7am, please page neurology on call as listed in AMION.

## 2016-03-23 NOTE — Procedures (Signed)
ELECTROENCEPHALOGRAM REPORT  Date of Study: 03/23/2016  Patient's Name: Randall Patel MRN: [id number] Date of Birth: [birth year]  Referring Provider: Dr. Ritta Slot  Clinical History: This is a 65 year old man s/p respiratory then cardiac arrest s/p CPR  Medications: propofol (DIPRIVAN) 1000 MG/100ML infusion  fentaNYL (SUBLIMAZE) bolus via infusion 50 mcg  famotidine (PEPCID) IVPB 20 mg premix  folic acid injection 1 mg  insulin aspart (novoLOG) injection 0-9 Units  ipratropium-albuterol (DUONEB) 0.5-2.5 (3) MG/3ML nebulizer solution 3 mL  norepinephrine (LEVOPHED) 4 mg in dextrose 5 % 250 mL (0.016 mg/mL) infusion  pantoprazole (PROTONIX) injection 40 mg  thiamine (B-1) injection 100 mg   Technical Summary: A multichannel digital EEG recording measured by the international 10-20 system with electrodes applied with paste and impedances below 5000 ohms performed as portable with EKG monitoring in an intubated and sedated patient.  Hyperventilation and photic stimulation were not performed.  The digital EEG was referentially recorded, reformatted, and digitally filtered in a variety of bipolar and referential montages for optimal display.   Description: The patient is intubated and sedated on Fentanyl and Propofol during the recording. There is no clear posterior dominant rhythm. There is diffuse background suppression and slowing, with low voltage alpha and delta activity. There is minimal reactivity with stimulation. Normal sleep architecture is not seen. There were no epileptiform discharges or electrographic seizures seen.    EKG lead was unremarkable.  Impression: This sedated EEG is abnormal due to diffuse background suppression and slowing.   Clinical Correlation of the above findings indicates diffuse cerebral dysfunction that is non-specific in etiology and can be seen with hypoxic/ischemic injury, toxic/metabolic encephalopathies, or medication effect from Fentanyl  and Propofol. There were no electrographic seizures seen in this study. Clinical correlation is advised.   Patrcia Dolly, M.D.

## 2016-03-23 NOTE — ED Provider Notes (Signed)
Medical screening examination/treatment/procedure(s) were conducted as a shared visit with non-physician practitioner(s) and myself.  I personally evaluated the patient during the encounter.   EKG Interpretation  Date/Time:  Tuesday March 23 2016 07:01:07 EST Ventricular Rate:  99 PR Interval:    QRS Duration: 99 QT Interval:  352 QTC Calculation: 450 R Axis:   35 Text Interpretation:  Sinus rhythm Left atrial enlargement Posterior infarct, old Artifact in lead(s) II III aVR aVL aVF V4 V5 V6 No prior for comparison Confirmed by Johanthan Kneeland  MD, Toni Amend ([id number]) on 03/23/2016 7:06:33 AM      This is a 65 year old male who presents following respiratory arrest. Per EMS, patient was found down with agonal respirations. He coded for EMS. He was a PA arrest. He was down for approximately 6 minutes with CPR the entire time and one dose of epinephrine. Return to sinus rhythm. He was intubated with frothy, bloody sputum. Difficult to oxygenate. History of CVA, CHF with an EF of 35%. On physical exam minimally responsive, breathing 40 times per minute spontaneously,  pupils 3 mm and reactive bilaterally, difficult to ventilate, coarse rhonchorous breath sounds bilaterally.  Chest x-ray at bedside shows diffuse pulmonary edema versus alveolar hemorrhage. IV nitroglycerin initiated. Lactate elevated. EKG is nonischemic. Troponin negative. He is acidotic with hypoxemia and hypercarbia. A shared very difficult to oxygenate. PEEP was turned up.  He is fighting the ventilator and appears to be auto peeping. Critical care is at the bedside. Initially plan was to paralyze the patient in order to take away his work of breathing; however, decision made to sedate. Given blood pressures, nitroglycerin discontinued. Patient with improved oxygenation with full sedation.    CRITICAL CARE Performed by: Shon Baton   Total critical care time: 45 minutes  Critical care time was exclusive of separately billable  procedures and treating other patients.  Critical care was necessary to treat or prevent imminent or life-threatening deterioration.  Critical care was time spent personally by me on the following activities: development of treatment plan with patient and/or surrogate as well as nursing, discussions with consultants, evaluation of patient's response to treatment, examination of patient, obtaining history from patient or surrogate, ordering and performing treatments and interventions, ordering and review of laboratory studies, ordering and review of radiographic studies, pulse oximetry and re-evaluation of patient's condition.    Shon Baton, MD 03/23/16 [phone]

## 2016-03-23 NOTE — ED Notes (Signed)
Critical Care at the bedside  

## 2016-03-23 NOTE — Progress Notes (Signed)
Initial Nutrition Assessment  INTERVENTION:   Vital AF 1.2 @ 55 ml/hr (1320 ml/day) 30 ml Prostat BID  Provides: 1784 kcal, 129 grams protein, and 1070 ml H2O.  TF regimen and propofol at current rate providing 2037 total kcal/day (104% of estimated kcal needs)  NUTRITION DIAGNOSIS:   Inadequate oral intake related to inability to eat as evidenced by NPO status.  GOAL:   Patient will meet greater than or equal to 90% of their needs  MONITOR:   Vent status, I & O's  REASON FOR ASSESSMENT:   Consult Assessment of nutrition requirement/status (TF recommendations)  ASSESSMENT:   Pt with hx of stroke x 2 (last left hemorrhagic with rt hand residual weaknes) CAD with past MI, ETOH with 5th bourbon every 2 days, tobacco 1/2 ppd life long, HTN who had PEA arrest en route to hospital per CT new ICH, intubated.    Patient is currently intubated on ventilator support MV: 16.7 L/min Temp (24hrs), Avg:97.5 F (36.4 C), Min:95 F (35 C), Max:100.8 F (38.2 C)  Propofol infusing at 9.6 ml/hr (253 kcal per day from lipid) Medications reviewed and include: folic acid, thiamine  Diet Order:  Diet NPO time specified  Skin:  Reviewed, no issues  Last BM:  unknown  Height:   Ht Readings from Last 1 Encounters:  03/23/16 5\' 6"  (1.676 m)    Weight:   Wt Readings from Last 1 Encounters:  03/23/16 176 lb 5.9 oz (80 kg)    Ideal Body Weight:  64.5 kg  BMI:  Body mass index is 28.47 kg/m.  Estimated Nutritional Needs:   Kcal:  1964  Protein:  115-130 grams  Fluid:  >2 L/day  EDUCATION NEEDS:   No education needs identified at this time  Kendell Bane RD, LDN, CNSC [phone] Pager [phone] After Hours Pager

## 2016-03-23 NOTE — Progress Notes (Signed)
EEG Completed; Results Pending  

## 2016-03-23 NOTE — ED Notes (Signed)
Unable to obtain Arterial Line

## 2016-03-23 NOTE — Progress Notes (Signed)
eLink Physician-Brief Progress Note Patient Name: Randall Patel DOB: 08-19-1950 MRN: [id number]   Date of Service  03/23/2016  HPI/Events of Note  Fever to 101.4 F. High risk for aspiration given intubation at home by EMS. Urine and blood cultures pending.   eICU Interventions  Will order: 1. Tylenol liquid 650 mg via tube Q 6 hours PRN.  2. Vancomycin and Zosyn per pharmacy consult.     Intervention Category Major Interventions: Infection - evaluation and management  Celicia Minahan Eugene 03/23/2016, 6:35 PM

## 2016-03-23 NOTE — Progress Notes (Signed)
Pharmacy Antibiotic Note  Randall Patel is a 65 y.o. male admitted on 03/23/2016 after being found unresponsive, he had PEA arrest with ROSC. Starting empiric antibiotics for Tmax 101.5.    Plan: -Zosyn 3.375 g IV q8h -Vancomycin 1500 mg IV x1 then 750/12h -Monitor renal fx, cultures, VT at steady state   Height: 5\' 6"  (167.6 cm) Weight: 176 lb 5.9 oz (80 kg) IBW/kg (Calculated) : 63.8  Temp (24hrs), Avg:98.2 F (36.8 C), Min:95 F (35 C), Max:101.5 F (38.6 C)   Recent Labs Lab 03/23/16 0645 03/23/16 0656 03/23/16 0807 03/23/16 0950 03/23/16 1400  WBC 10.6*  --  7.0  --   --   CREATININE 1.39* 1.20 1.13  --   --   LATICACIDVEN  --  4.66*  --  1.8 1.8    Estimated Creatinine Clearance: 66.5 mL/min (by C-G formula based on SCr of 1.13 mg/dL).    No Known Allergies  Antimicrobials this admission: 12/12 vancomycin > 12/12 zosyn >  Dose adjustments this admission: NA  Microbiology results: Pending  Thank you for allowing pharmacy to be a part of this patient's care.  Baldemar Friday 03/23/2016 6:54 PM

## 2016-03-23 NOTE — ED Notes (Signed)
Phlebotomy and EEG at the bedside

## 2016-03-23 NOTE — ED Notes (Signed)
Attempted Report x1. RN to call back

## 2016-03-23 NOTE — ED Notes (Signed)
Pharmacy Called for Levo drip

## 2016-03-23 NOTE — ED Notes (Signed)
EEG is still in the room at this time. Pt to go upstairs when completed.

## 2016-03-23 NOTE — Procedures (Signed)
Central Venous Catheter Insertion Procedure Note Randall Patel [id number] [birth year]  Procedure: Insertion of Central Venous Catheter Indications: Assessment of intravascular volume, Drug and/or fluid administration and Frequent blood sampling  Procedure Details Consent: Unable to obtain consent because of emergent medical necessity. Time Out: Verified patient identification, verified procedure, site/side was marked, verified correct patient position, special equipment/implants available, medications/allergies/relevent history reviewed, required imaging and test results available.  Performed  Maximum sterile technique was used including antiseptics, cap, gloves, gown, hand hygiene, mask and sheet. Skin prep: Chlorhexidine; local anesthetic administered A antimicrobial bonded/coated triple lumen catheter was placed in the left internal jugular vein using the Seldinger technique. Ultrasound guidance used.Yes.   Catheter placed to 20 cm. Blood aspirated via all 3 ports and then flushed x 3. Line sutured x 2 and dressing applied.  Evaluation Blood flow good Complications: No apparent complications Patient did tolerate procedure well. Chest X-ray ordered to verify placement.  CXR: pending.  Brett Canales Tristen Pennino ACNP [facility] PCC[facility] Pager [phone] till 3 pm If no answer page [phone] 03/23/2016, 9:30 AM

## 2016-03-23 NOTE — ED Notes (Addendum)
RT at the bedside placing Arterial Line

## 2016-03-23 NOTE — ED Notes (Signed)
Xray verified at the bedside

## 2016-03-23 NOTE — Progress Notes (Signed)
During mouth care, it was noted that the pt's bottom 3 front teeth are loose.  Apparent severe gum disease.  Malodorous.    Aretta Nip, RN

## 2016-03-23 NOTE — ED Notes (Signed)
Per Dr Wilkie Aye, no ice packs to axilla or groin.

## 2016-03-23 NOTE — ED Triage Notes (Signed)
[facility] called to residence for shortness of breath.  Patient was found on floor in bathroom, not responding, breathing with a pulse, pink frothy sputum seen.  Patient went into cardiac arrest, EMS did CPR for 6 mins, patient was given one epi.  Patient went from PEA to asystole to ST when ROSC was achieved.  Patient was intubated and I/O in left tibia.

## 2016-03-23 NOTE — H&P (Signed)
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 He walked into sister's bedroom and co sob. Found in br with bloody sputum and unresponsive but breathing. EMS was activated and they transported to [facility] ED but in route had PEA arrest and required Epi x 1 and intubation with #7 ett. Found to be acidotic ph 7.12, pco2 66, po2 47 in ed. [name] pulmonary edema on exam and cxr. Vent changes, diuresis, sedation change made with improved ventilation. He will need CT head to ro ICH prior to hypothermia protocol.  PAST MEDICAL HISTORY :  He  has no past medical history on file. See merged chart  PAST SURGICAL HISTORY: He  has no past surgical history on file.  No Known Allergies  No current facility-administered medications on file prior to encounter.    No current outpatient prescriptions on file prior to encounter.    FAMILY HISTORY:  His has no family status information on file.    SOCIAL HISTORY: 1/2 ppd tobacco, 5 th eton every 2 days  REVIEW OF SYSTEMS:   NA  SUBJECTIVE:  Sedated on vent  VITAL SIGNS: BP 126/93 (BP Location: Right Arm)   Pulse (!) 48   Temp 97.5 F (36.4 C) (Core (Comment))   Resp (!) 32   Ht 5\' 6"  (1.676 m)   Wt 176 lb 5.9 oz (80 kg)   SpO2 99%   BMI 28.47 kg/m   HEMODYNAMICS:    VENTILATOR SETTINGS: Vent Mode: PRVC FiO2 (%):  [100 %] 100 % Set Rate:  [20 bmp-32 bmp] 32 bmp Vt Set:  [500 mL] 500 mL PEEP:  [10 cmH20-18 cmH20] 18 cmH20 Plateau Pressure:  [24 cmH20] 24 cmH20  INTAKE / OUTPUT: No intake/output data recorded.  PHYSICAL EXAMINATION: General:  WNWDAAM on vent and in acute  resp distress Neuro:  NO follows commands, + gag, over breaths vent HEENT:  Disconjucate gaze. perl 4 mm Cardiovascular:  HSD Lungs:Crackles thru out Abdomen:  Soft +bs Musculoskeletal:  intact Skin:  Warm and dry  LABS:  BMET  Recent Labs Lab 03/23/16 0645 03/23/16 0656  NA 138 138  K 5.8* 6.0*  CL 106 106  CO2 22  --   BUN 13 20  CREATININE 1.39* 1.20  GLUCOSE 256* 243*    Electrolytes  Recent Labs Lab 03/23/16 0645  CALCIUM 8.5*    CBC  Recent Labs Lab 03/23/16 0645 03/23/16 0656  WBC 10.6*  --   HGB 14.0 15.3  HCT 44.2 45.0  PLT 169  --     Coag's No results for input(s): APTT, INR in the last 168 hours.  Sepsis Markers  Recent Labs Lab 03/23/16 0656  LATICACIDVEN 4.66*    ABG  Recent Labs Lab 03/23/16 0626 03/23/16 0710  PHART 7.073* 7.127*  PCO2ART 64.9* 65.4*  PO2ART 61.0* 49.0*    Liver Enzymes  Recent Labs Lab 03/23/16 0645  AST 26  ALT 20  ALKPHOS 54  BILITOT 0.1*  ALBUMIN 3.3*    Cardiac Enzymes  Recent Labs Lab 03/23/16 0645  TROPONINI 0.04*    Glucose  No results for input(s): GLUCAP in the last 168 hours.  Imaging Dg Chest Portable 1 View  Result Date: 03/23/2016 CLINICAL DATA:  Central line placement EXAM: PORTABLE CHEST 1 VIEW COMPARISON:  03/23/2016 FINDINGS: Left central line has been placed with the tip in the SVC. No pneumothorax. Bilateral severe airspace disease, right greater than left, unchanged. Endotracheal tube and NG tube are in stable position. Heart is normal size. IMPRESSION: Left central line tip in the SVC.  No pneumothorax. Stable severe bilateral airspace disease. Electronically Signed   By: Charlett Nose M.D.   On: 03/23/2016 08:29   Dg Chest Port 1 View  Result Date: 03/23/2016 CLINICAL DATA:  65 year old male with respiratory distress. Status post CPR and intubation. EXAM: PORTABLE CHEST 1 VIEW COMPARISON:  None. FINDINGS: Endotracheal tube with tip approximately 7.7 cm above the  carina. Diffuse bilateral airspace opacities noted which may represent multifocal pneumonia, ARDS, alveolar hemorrhage. There is no pleural effusion or pneumothorax. There is mild cardiomegaly. No acute osseous pathology identified. IMPRESSION: Endotracheal tube above the carina. Diffuse bilateral airspace opacities. Cardiomegaly. Electronically Signed   By: Elgie Collard M.D.   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 He walked into sister's bedroom and co sob. Found in br with bloody sputum and unresponsive but breathing. EMS was activated and they transported to [facility] ED but in route had PEA arrest and required Epi x 1 and intubation with #7 ett. Found to be acidotic ph 7.12, pco2 66, po2 47 in ed. [name] pulmonary edema on exam and cxr. Vent changes, diuresis, sedation change made with improved ventilation. He will need CT head to ro ICH prior to hypothermia protocol.  ASSESSMENT / PLAN:  PULMONARY A: Resp failure with pulmonary edema Hemoptysis COPD with tobacco abuse Profound hypoxia and hypercarbia  P:   Vent bundle Lasix BD's Peep as needed  CARDIOVASCULAR A:  Hx of MI HTN Heavy ETOH/Tobacco use P:  12 lead Trop CVP May need cards consult Lasix Pressors No hypothermia with neuro issues  RENAL A:   Acute renal insuff Hyperkalemia(most likely due to acidosis) P:   Correct acidosis Follow creatine   GASTROINTESTINAL A:   GI protection P:   PPI  HEMATOLOGIC A:   No acute issue P:  Follow labs  INFECTIOUS A:   No reported S/S of infection P:   Pan culture  ENDOCRINE A:    Hyperglycemia P:   SSI  NEUROLOGIC A:   Post arrest, sedated on vent. +gag, over breaths vent Hx of 2 strokes last 2 years with rt hand weakness 5TH etoh every 2 days Szs hx P:   RASS goal-1 Check ct head ro stroke or ICH as culprit. Thiamine folic acid Neuro consult after ct head   FAMILY  - Updates: Sister and brother updated 12/12  - Inter-disciplinary family meet or Palliative Care meeting due by:  day 7  -CCT=80 min  Brett Canales Minor ACNP [facility] PCC[facility] Pager [phone] till 3 pm If no answer page [phone] 03/23/2016, 8:35 AM  65 yo with PEA arrest after respiratory arrest.  Pulmonary edema on CXR.  Hx of CVA and seizures.  CT head shows new bleeding.  Unresponsive.  B/l crackles.  HR regular.  Abd soft.  No edema.  CXR - b/l ASD  Creatinine 1.2, K 6, pH 7.12, WBC 7, Hb 14.2  Assessment/plan:  Acute respiratory failure. - full vent support - f/u UDS  ICH. Hx of CVA, seizures. - consult neurology  Hypotension. - f/u Echo - pressors  Hyperkalemia likely from acidosis. - f/u BMET, ABG  CC time by me independent of APP time 36 minutes  Coralyn HellingVineet Yaire Kreher, MD [facility] 03/23/2016, 11:04 AM Pager:  [phone] After 3pm call: [phone]

## 2016-03-23 NOTE — ED Notes (Signed)
Critical Care at the bedside placing [address]

## 2016-03-24 ENCOUNTER — Inpatient Hospital Stay (HOSPITAL_COMMUNITY): Payer: Medicare HMO

## 2016-03-24 ENCOUNTER — Encounter: Payer: Self-pay | Admitting: Student

## 2016-03-24 ENCOUNTER — Encounter (HOSPITAL_COMMUNITY): Payer: Self-pay | Admitting: Internal Medicine

## 2016-03-24 ENCOUNTER — Other Ambulatory Visit (HOSPITAL_COMMUNITY): Payer: Medicare HMO

## 2016-03-24 DIAGNOSIS — I6200 Nontraumatic subdural hemorrhage, unspecified: Secondary | ICD-10-CM

## 2016-03-24 DIAGNOSIS — G931 Anoxic brain damage, not elsewhere classified: Secondary | ICD-10-CM

## 2016-03-24 DIAGNOSIS — J9621 Acute and chronic respiratory failure with hypoxia: Secondary | ICD-10-CM

## 2016-03-24 DIAGNOSIS — R570 Cardiogenic shock: Secondary | ICD-10-CM

## 2016-03-24 LAB — BLOOD GAS, ARTERIAL
Acid-base deficit: 5 mmol/L — ABNORMAL HIGH (ref 0.0–2.0)
Bicarbonate: 17.2 mmol/L — ABNORMAL LOW (ref 20.0–28.0)
Drawn by: 39898
FIO2: 80
O2 Saturation: 99.4 %
PATIENT TEMPERATURE: 98.7
PCO2 ART: 20.3 mmHg — AB (ref 32.0–48.0)
PEEP: 12 cmH2O
PO2 ART: 277 mmHg — AB (ref 83.0–108.0)
RATE: 32 resp/min
VT: 500 mL
pH, Arterial: 7.537 — ABNORMAL HIGH (ref 7.350–7.450)

## 2016-03-24 LAB — HEPATIC FUNCTION PANEL
ALK PHOS: 29 U/L — AB (ref 38–126)
ALT: 16 U/L — AB (ref 17–63)
AST: 20 U/L (ref 15–41)
Albumin: 2.7 g/dL — ABNORMAL LOW (ref 3.5–5.0)
BILIRUBIN DIRECT: 0.3 mg/dL (ref 0.1–0.5)
BILIRUBIN INDIRECT: 0.9 mg/dL (ref 0.3–0.9)
BILIRUBIN TOTAL: 1.2 mg/dL (ref 0.3–1.2)
Total Protein: 5.4 g/dL — ABNORMAL LOW (ref 6.5–8.1)

## 2016-03-24 LAB — BASIC METABOLIC PANEL
Anion gap: 8 (ref 5–15)
BUN: 23 mg/dL — AB (ref 6–20)
CALCIUM: 7.9 mg/dL — AB (ref 8.9–10.3)
CHLORIDE: 112 mmol/L — AB (ref 101–111)
CO2: 20 mmol/L — ABNORMAL LOW (ref 22–32)
CREATININE: 1.61 mg/dL — AB (ref 0.61–1.24)
GFR calc Af Amer: 51 mL/min — ABNORMAL LOW (ref >60)
GFR calc non Af Amer: 44 mL/min — ABNORMAL LOW (ref >60)
Glucose, Bld: 123 mg/dL — ABNORMAL HIGH (ref 65–99)
Potassium: 3.2 mmol/L — ABNORMAL LOW (ref 3.5–5.1)
Sodium: 140 mmol/L (ref 135–145)

## 2016-03-24 LAB — TROPONIN I
Troponin I: 0.21 ng/mL (ref <0.03)
Troponin I: 0.21 ng/mL (ref <0.03)

## 2016-03-24 LAB — GLUCOSE, CAPILLARY
GLUCOSE-CAPILLARY: 108 mg/dL — AB (ref 65–99)
GLUCOSE-CAPILLARY: 111 mg/dL — AB (ref 65–99)
GLUCOSE-CAPILLARY: 125 mg/dL — AB (ref 65–99)
Glucose-Capillary: 121 mg/dL — ABNORMAL HIGH (ref 65–99)
Glucose-Capillary: 116 mg/dL — ABNORMAL HIGH (ref 65–99)
Glucose-Capillary: 134 mg/dL — ABNORMAL HIGH (ref 65–99)

## 2016-03-24 LAB — URINE CULTURE
Culture: NO GROWTH
Special Requests: NORMAL

## 2016-03-24 LAB — CBC
HCT: 32.8 % — ABNORMAL LOW (ref 39.0–52.0)
Hemoglobin: 10.9 g/dL — ABNORMAL LOW (ref 13.0–17.0)
MCH: 27.7 pg (ref 26.0–34.0)
MCHC: 33.2 g/dL (ref 30.0–36.0)
MCV: 83.5 fL (ref 78.0–100.0)
PLATELETS: 118 10*3/uL — AB (ref 150–400)
RBC: 3.93 MIL/uL — ABNORMAL LOW (ref 4.22–5.81)
RDW: 14.3 % (ref 11.5–15.5)
WBC: 6.6 10*3/uL (ref 4.0–10.5)

## 2016-03-24 LAB — PROCALCITONIN: Procalcitonin: 3.93 ng/mL

## 2016-03-24 MED ORDER — MIDAZOLAM HCL 2 MG/2ML IJ SOLN
1.0000 mg | INTRAMUSCULAR | Status: DC | PRN
  Administered 2016-03-24: 2 mg via INTRAVENOUS
  Filled 2016-03-24: qty 2

## 2016-03-24 MED ORDER — PROPOFOL 1000 MG/100ML IV EMUL
5.0000 ug/kg/min | INTRAVENOUS | Status: DC
  Administered 2016-03-24 (×2): 30 ug/kg/min via INTRAVENOUS
  Administered 2016-03-24: 15 ug/kg/min via INTRAVENOUS
  Administered 2016-03-25: 30 ug/kg/min via INTRAVENOUS
  Filled 2016-03-24 (×2): qty 100

## 2016-03-24 MED ORDER — VITAL AF 1.2 CAL PO LIQD
1000.0000 mL | ORAL | Status: DC
  Administered 2016-03-24 – 2016-03-26 (×3): 1000 mL
  Filled 2016-03-24 (×7): qty 1000

## 2016-03-24 MED ORDER — PRO-STAT SUGAR FREE PO LIQD
30.0000 mL | Freq: Two times a day (BID) | ORAL | Status: DC
  Administered 2016-03-24 – 2016-03-26 (×6): 30 mL
  Filled 2016-03-24 (×6): qty 30

## 2016-03-24 MED ORDER — PROPOFOL 1000 MG/100ML IV EMUL
INTRAVENOUS | Status: AC
  Filled 2016-03-24: qty 100

## 2016-03-24 MED ORDER — POTASSIUM CHLORIDE 2 MEQ/ML IV SOLN
30.0000 meq | Freq: Once | INTRAVENOUS | Status: AC
  Administered 2016-03-24: 30 meq via INTRAVENOUS
  Filled 2016-03-24: qty 15

## 2016-03-24 NOTE — Progress Notes (Signed)
Nutrition Follow-up  INTERVENTION:   Vital AF 1.2 @ 55 ml/hr (1320 ml/day) 30 ml Prostat BID  Provides: 1784 kcal, 129 grams protein, and 1070 ml H2O.   NUTRITION DIAGNOSIS:   Inadequate oral intake related to inability to eat as evidenced by NPO status. Ongoing.   GOAL:   Patient will meet greater than or equal to 90% of their needs Progressing.   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   Patient is currently intubated on ventilator support MV: 9.3 L/min Temp (24hrs), Avg:100.8 F (38.2 C), Min:99.5 F (37.5 C), Max:101.7 F (38.7 C)  Propofol: @ 9.6 ml/hr weaning off due to hypotension Medications reviewed and include: folic acid, thiamine  Diet Order:  Diet NPO time specified  Skin:  Reviewed, no issues  Last BM:  unknown  Height:   Ht Readings from Last 1 Encounters:  03/23/16 5\' 6"  (1.676 m)    Weight:   Wt Readings from Last 1 Encounters:  03/24/16 154 lb 9.6 oz (70.1 kg)    Ideal Body Weight:  64.5 kg  BMI:  Body mass index is 24.95 kg/m.  Estimated Nutritional Needs:   Kcal:  1831  Protein:  115-130 grams  Fluid:  >2 L/day  EDUCATION NEEDS:   No education needs identified at this time  Kendell Bane RD, LDN, CNSC [phone] Pager [phone] After Hours Pager

## 2016-03-24 NOTE — Consult Note (Addendum)
Cardiology Consult    Patient ID: Jaxtyn Linville MRN: [id number], DOB/AGE: 08/01/1950   Admit date: 03/23/2016 Date of Consult: 03/24/2016  Primary Physician: Pcp Not In System Reason for Consult: Cardiac Arrest Primary Cardiologist: Dr. Angelena Form Requesting Provider: Dr. Halford Chessman   History of Present Illness    Randall Patel is a 65 y.o. male with past medical history of CAD (cath in 2013 showing DES to mid-distal RCA, POBA of PL branch), ischemic cardiomyopathy (EF 40-45% by echo in 03/2015), CVA (03/2012), HTN, HLD, tobacco use, and Etoh abuse who presented to [facility] ED on 03/23/2016 as a cardiac arrest.   The patient is currently intubated, therefore history is obtained by review of the patient's chart (and his prior chart marked for merge). Patient's niece is at the bedside.  EMS had been called to his residence yesterday for him experiencing worsening dyspnea. He was found on the ground and unresponsive but with a pulse initially, and bloody sputum noted. He then went into cardiac arrest with CPR being performed for over 6 minutes with administration of Epinephrine x1. Was initially in PEA then asystole and back to sinus tachycardia with ROSC.    Initial labs showed he was acidotic with a pH of 7.07, pCO2 64, and pO2 61. K+ 5.8. Creatinine 1.39. WBC 10.6, Hgb 14.0, and platelets 169. Initial troponin 0.04 with repeat values at 0.13, 0.21, 0.24, and 0.21. BNP 540. UDS positive for Benzos and THC. Lactic Acid 4.66. Mg 1.8. EKG shows NSR, HR 60, with inferior Q-waves (similar to prior tracings in merged chart). CXR with diffuse bilateral airspace opacities. CT Head showing evidence indicating acute partially hemorrhagic infarct in the right frontal lobe mid to posterior aspect with an old infarct more anteriorly in the right frontal lobe. MRI of brain with subacute hematoma in the right posterior frontal lobe measuring up to 31 mm.  He remains intubated and on broad-spectrum  antibiotics. Experiencing episodes of alternating bradycardia and tachycardia, with HR peaking into the 140's with agitation (improved with Propofol being restarted). Was initially on Levophed but this has been discontinued with most recent BP reading at 179/116..   Prior cardiac evaluation includes an echocardiogram in 03/2015 which showed an EF of 40% to 45% with akinesis of the mid-apicalinferolateral myocardium with Grade 1 DD. His last cardiac catheterization was in 03/2012 which showed 30% ostial LM stenosis, 30% pLAD stenosis, 60% mid-LADm 30% pCx, 3rd OM which was small in size with diffuse 90% stenosis involving both branches, and RCA with 50% mid-stenosis and 100% distal occlusion. This was treated with DES x1 to the dRCA with successful PTCA with balloon angioplasty only of the posterolateral branch being perfomed.    Past Medical History   Past Medical History:  Diagnosis Date  . CAD (coronary artery disease)    a. prior MI in 2006 b. 03/2012: 30% ostial LM stenosis, 30% pLAD stenosis, 60% mid-LADm 30% pCx, 3rd OM which was small in size with diffuse 90% stenosis involving both branches, and RCA with 50% mid-stenosis and 100% distal occlusion. POBA to PL branch and DES placement to dRCA.  [name] History of stroke   . Ischemic cardiomyopathy    a. EF 40-45% by echo in 03/2015    History reviewed. No pertinent surgical history.   Allergies  Allergies  Allergen Reactions  . Lisinopril Swelling    This allergy is noted from previous electronic record; however, pt. Is unable to confirm this allergy at this time  . Other  Son states he is allergic to an antibiotics  Not sur which one. Also pharmacy has no records of any allergies     Inpatient Medications    . chlorhexidine gluconate (MEDLINE KIT)  15 mL Mouth Rinse BID  . famotidine (PEPCID) IV  20 mg Intravenous Q12H  . feeding supplement (PRO-STAT SUGAR FREE 64)  30 mL Per Tube BID  . folic acid  1 mg Intravenous Daily  .  insulin aspart  0-9 Units Subcutaneous Q4H  . ipratropium-albuterol  3 mL Nebulization Q6H  . mouth rinse  15 mL Mouth Rinse 10 times per day  . pantoprazole (PROTONIX) IV  40 mg Intravenous QHS  . piperacillin-tazobactam (ZOSYN)  IV  3.375 g Intravenous Q8H  . sodium chloride flush  10-40 mL Intracatheter Q12H  . thiamine injection  100 mg Intravenous Daily  . vancomycin  750 mg Intravenous Q12H    Family History    Family History  Problem Relation Age of Onset  . Heart attack Father   . Hypertension Father     Social History    Social History   Social History  . Marital status: Single    Spouse name: N/A  . Number of children: N/A  . Years of education: N/A   Occupational History  . Not on file.   Social History Main Topics  . Smoking status: Current Every Day Smoker    Packs/day: 1.00    Years: 40.00  . Smokeless tobacco: Never Used  . Alcohol use Yes  . Drug use:     Types: Marijuana  . Sexual activity: Not on file   Other Topics Concern  . Not on file   Social History Narrative  . No narrative on file     Review of Systems    Unable to be obtained secondary to intubation and sedation.   Physical Exam    Blood pressure (!) 179/116, pulse (!) 126, temperature 99.7 F (37.6 C), temperature source Core (Comment), resp. rate 16, height _0  (1.676 m), weight 154 lb 9.6 oz (70.1 kg), SpO2 97 %.  General: African American male appearing in NAD. Currently intubated and sedated. Psych: Normal affect. Neuro: Currently intubated and sedated.  HEENT: Normal  Neck: Supple without bruits or JVD. Lungs:  Resp regular and unlabored, CTA without wheezing or rales. Heart: RRR no s3, s4, or murmurs. Abdomen: Soft, non-tender, non-distended, BS + x 4.  Extremities: No clubbing, cyanosis or edema. DP/PT/Radials 2+ and equal bilaterally.  Labs    Troponin [facility] of Care Test)  Recent Labs  03/23/16 0654  TROPIPOC 0.04    Recent Labs  03/23/16 1700  03/23/16 2230 03/24/16 1002 03/24/16 1515  TROPONINI 0.21* 0.24* 0.21* 0.21*   Lab Results  Component Value Date   WBC 6.6 03/24/2016   HGB 10.9 (L) 03/24/2016   HCT 32.8 (L) 03/24/2016   MCV 83.5 03/24/2016   PLT 118 (L) 03/24/2016     Recent Labs Lab 03/24/16 0300 03/24/16 1002  NA 140  --   K 3.2*  --   CL 112*  --   CO2 20*  --   BUN 23*  --   CREATININE 1.61*  --   CALCIUM 7.9*  --   PROT  --  5.4*  BILITOT  --  1.2  ALKPHOS  --  29*  ALT  --  16*  AST  --  20  GLUCOSE 123*  --    Lab Results  Component Value Date  TRIG 97 03/23/2016   No results found for: [facility]   Radiology Studies    Ct Head Wo Contrast:  Result Date: 03/23/2016 CLINICAL DATA:  Fall earlier today.  Episode of cardiac arrest EXAM: CT HEAD WITHOUT CONTRAST TECHNIQUE: Contiguous axial images were obtained from the base of the skull through the vertex without intravenous contrast. COMPARISON:  None. FINDINGS: Brain: There is mild diffuse atrophy. There is a prior infarct in the anteromedial right frontal lobe. There is decreased attenuation in the mid superior right frontal lobe with foci of hemorrhage within this lesion, likely acute partially hemorrhagic infarct. Infarct extends more inferiorly the level of the right internal capsule. Elsewhere gray-white compartments appear normal. There is no midline shift or extra-axial fluid collection. Vascular: There is no appreciable hyperdense vessel. There is calcification in each carotid siphon region. Skull: The bony calvarium appears intact. A defect in the medial left orbital wall may be congenital or residua of prior trauma. Sinuses/Orbits: There is opacification in multiple ethmoid air cells bilaterally. There is mucosal thickening in both maxillary antra, more severe on the right than on the left. There is opacification with air-fluid levels in each sphenoid sinus. There is leftward deviation of the nasal septum. Orbits appear symmetric bilaterally.  Other: Mastoid air cells are clear. IMPRESSION: Evidence indicating acute partially hemorrhagic infarct in the right frontal lobe mid to posterior aspect. Old infarct more anteriorly in the right frontal lobe. Mild underlying atrophy. Areas of paranasal sinus disease at multiple sites. Areas of arterial vascular calcification noted. Critical Value/emergent results were called by telephone at the time of interpretation on 03/23/2016 at 9:06 am to Dr. Idolina Primer, ED physician , who verbally acknowledged these results. Electronically Signed   By: Lowella Grip III M.D.   On: 03/23/2016 09:06   Mr Brain Wo Contrast: Result Date: 03/24/2016 CLINICAL DATA:  65 y/o M; found unresponsive with episode of cardiac arrest. EXAM: MRI HEAD WITHOUT CONTRAST TECHNIQUE: Multiplanar, multiecho pulse sequences of the brain and surrounding structures were obtained without intravenous contrast. COMPARISON:  03/23/2016 CT of the head. FINDINGS: Brain: Hemosiderin stained encephalomalacia of the right superior anterior frontal lobe and of the right lateral frontal lobe compatible with chronic infarction. Hematoma centered in the right posterior frontal lobe measuring 27 x 31 x 20 mm (AP x ML x CC) with T1 and T2 hyperintensity compatible with subacute hematoma. No diffusion hyperintensity to suggest acute/early subacute infarct. No hydrocephalus. No extra-axial collection. Vascular: Normal flow voids. Skull and upper cervical spine: Normal marrow signal. Sinuses/Orbits: Chronic left lamina papyracea fracture. Orbits are unremarkable. Mild paranasal sinus mucosal thickening, fluid in nasopharynx, and patchy opacification of ethmoid air cells, probably due to intubation. Other: None. IMPRESSION: 1. Subacute hematoma in the right posterior frontal lobe measuring up to 31 mm. 2. Chronic right anterior superior and right lateral frontal infarcts. 3. No evidence for acute/early subacute infarct or significant mass effect. Electronically  Signed   By: Kristine Garbe M.D.   On: 03/24/2016 05:49   Dg Chest Port 1 View: Result Date: 03/24/2016 CLINICAL DATA:  Hypoxia EXAM: PORTABLE CHEST 1 VIEW COMPARISON:  March 23, 2016 FINDINGS: Endotracheal tube tip is 7.0 cm above the carina. Central catheter tip is in superior vena cava. Nasogastric tube tip and side port are below the diaphragm. No pneumothorax. There has been significant interval resolution of alveolar edema compared to 1 day prior. Mild patchy interstitial alveolar edema do remain in the mid lower lung zones. No new opacity. Heart is  upper normal in size with pulmonary vascularity within normal limits. No adenopathy. There is atherosclerotic calcification in the aorta. No bone lesions. IMPRESSION: Tube and catheter positions as described without evident pneumothorax. There remains patchy interstitial and alveolar edema, much less than 1 day prior. No new opacity. Stable cardiac silhouette. There is aortic atherosclerosis. Electronically Signed   By: Lowella Grip III M.D.   On: 03/24/2016 08:37   Dg Chest Portable 1 View: Result Date: 03/23/2016 CLINICAL DATA:  Central line placement EXAM: PORTABLE CHEST 1 VIEW COMPARISON:  03/23/2016 FINDINGS: Left central line has been placed with the tip in the SVC. No pneumothorax. Bilateral severe airspace disease, right greater than left, unchanged. Endotracheal tube and NG tube are in stable position. Heart is normal size. IMPRESSION: Left central line tip in the SVC.  No pneumothorax. Stable severe bilateral airspace disease. Electronically Signed   By: Rolm Baptise M.D.   On: 03/23/2016 08:29   Dg Chest Port 1 View: Result Date: 03/23/2016 CLINICAL DATA:  65 year old male with respiratory distress. Status post CPR and intubation. EXAM: PORTABLE CHEST 1 VIEW COMPARISON:  None. FINDINGS: Endotracheal tube with tip approximately 7.7 cm above the carina. Diffuse bilateral airspace opacities noted which may represent multifocal  pneumonia, ARDS, alveolar hemorrhage. There is no pleural effusion or pneumothorax. There is mild cardiomegaly. No acute osseous pathology identified. IMPRESSION: Endotracheal tube above the carina. Diffuse bilateral airspace opacities. Cardiomegaly. Electronically Signed   By: Anner Crete M.D.   On: 03/23/2016 06:33    EKG & Cardiac Imaging    EKG:  NSR, HR 60, with inferior Q-waves (similar to prior tracings in merged chart).  Echocardiogram: 03/2015 Study Conclusions  - Left ventricle: The cavity size was normal. Wall thickness was   increased in a pattern of moderate LVH. Systolic function was   mildly to moderately reduced. The estimated ejection fraction was   in the range of 40% to 45%. There is akinesis of the   mid-apicalinferolateral myocardium. Doppler parameters are   consistent with abnormal left ventricular relaxation (grade 1   diastolic dysfunction). - Mitral valve: There was mild regurgitation.  Impressions:  - No cardiac source of emboli was indentified. Compared to the   prior study, there has been no significant interval change.   Cardiac Catheterization: 03/2012 Hemodynamic Findings: Central aortic pressure: 113/75 Left ventricular pressure: 106/5/9  Angiographic Findings:  Left main: Ostial 30% stenosis with heavy calcification.  Left Anterior Descending Artery: Large caliber vessel that courses to the apex. The proximal vessel has 30% stenosis. The mid vessel has a 60% stenosis at the takeoff of a septal perforating branch. Moderate caliber diagonal branch with mild plaque disease.   Circumflex Artery: Moderate caliber vessel. The proximal vessel has diffuse 30% stenosis. The first 2 obtuse marginal branches are very small. The third obtuse marginal branch is a small bifurcating vessel with diffuse 90% stenosis involving both branches. (1.75 mm vessel)  Right Coronary Artery: Large, dominant artery with 50% mid stenosis and 100% distal  occlusion.   Left Ventricular Angiogram: LVEF=30-35% with akinesis of the inferior wall.   Impression: 1. NSTEMI 2. Triple vessel CAD with occluded RCA, culprit vessel. 3. Successful PTCA/DES x 1 distal RCA 4. Successful PTCA with balloon angioplasty only of the posterolateral branch.  5. Segmental LV systolic dysfunction  Recommendations: Will continue ASA and Plavix for at least one year but longer if he tolerates. Will start statin. Will hold beta blocker secondary to bradycardia. He is intolerant to Ace-inhibitors.  Will run Angiomax for two hours then stop. Plavix was used. (Effient contraindicated with prior CVA). He has been on long term Plavix. Will check P2Y12 in am. If PRU is greater than 230, will stop Plavix and load with Brilinta.        Complications:  None. The patient tolerated the procedure well.           Assessment & Plan    1. Cardiac Arrest - initially reported acute dyspnea but arrested upon EMS arrival. Initially in PEA then Asystole. CPR was performed for over 6 minutes with administration of Epinephrine x1 with ROSC.  - primary cardiac etiology less likely with no acute ST or T-wave changes and flat troponin trend. - will obtain echocardiogram to assess LV function and wall motion. Not a current cardiac catheterization candidate with his right frontal lobe hematoma. No Heparin given hematoma.   2. CAD - cath in 03/2012 showed 30% ostial LM stenosis, 30% pLAD stenosis, 60% mid-LADm 30% pCx, 3rd OM which was small in size with diffuse 90% stenosis involving both branches, and RCA with 50% mid-stenosis and 100% distal occlusion. This was treated with DES x1 to the dRCA with successful PTCA with balloon angioplasty only of the posterolateral branch being perfomed.  - plan to resume BB, ACE-I, and statin once BP remains stable.   3. Nonischemic Cardiomyopathy - EF 40-45% by echocardiogram in 03/2015. Does not appear volume overloaded by physical exam. - plan to  restart BB and ACE-I as hemodynamics allow.   4. Elevated Troponin - cyclic troponin values at 0.04, 0.13, 0.21, 0.24, and 0.21. Likely secondary to demand ischemia in the setting of his cardiac arrest.  5. Respiratory Failure with Pulmonary Edema and Hemoptysis - remains intubated and sedated. On broad-spectrum antibiotics.  - per Critical Care  6. Subacute Hematoma - MRI of brain this admission shows subacute hematoma in the right posterior frontal lobe measuring up to 31 mm. - Neurology following.   Signed, Erma Heritage, PA-C 03/24/2016, 4:29 PM Pager: [phone]  Personally seen and examined. Agree with above. 65 year old with pulseless electrical activity arrest, does not appear to be cardiac in origin. Troponin minimally elevated which would be expected in the setting of shock, demand ischemia. Technically type III MI. From a neurologic standpoint, he is agitated. Does not appear to be volume overloaded. When able, resume home beta blocker and ACE inhibitor. Avoid aspirin/Plavix given his subacute hematoma, subdural. EKG appears stable without any evidence of ST elevation.  We will follow along.  Critical care time 35 minutes spent with patient, family, assessing data, in this critically ill gentleman with multisystem organ failure.  Candee Furbish, MD

## 2016-03-24 NOTE — Progress Notes (Signed)
Pt transported to and from MRI on 100% uneventful trip no distress or complications noted.

## 2016-03-24 NOTE — Progress Notes (Signed)
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 He walked into sister's bedroom and co sob. Found in br with bloody sputum and unresponsive but breathing. EMS was activated and they transported to [facility] ED but in route had PEA arrest and required Epi x 1 and intubation with #7 ett. Found to be acidotic ph 7.12, pco2 66, po2 47 in ed. [name] pulmonary edema on exam and cxr. Vent changes, diuresis, sedation change made with improved ventilation. He will need CT head to ro ICH prior to hypothermia protocol.  SUBJECTIVE:  Admitted 12/12. Found down. PEA. ICH. Now Sedated on Vent.  VITAL SIGNS: BP 105/73   Pulse (!) 56   Temp 99.7 F (37.6 C)   Resp (!) 32   Ht 5\' 6"  (1.676 m)   Wt 70.1 kg (154 lb 9.6 oz)   SpO2 100%   BMI 24.95 kg/m   HEMODYNAMICS: CVP:  [5 mmHg-14 mmHg] 14 mmHg  VENTILATOR SETTINGS: Vent Mode: PRVC FiO2 (%):  [80 %-100 %] 80 % Set Rate:  [32 bmp] 32 bmp Vt Set:  [500 mL] 500 mL PEEP:  [12 cmH20] 12 cmH20 Plateau Pressure:  [28 cmH20-32 cmH20] 28 cmH20  INTAKE / OUTPUT: I/O last 3 completed shifts: In: 1747.9 [I.V.:772.9; NG/GT:60; IV Piggyback:915] Out: 1190 [Urine:1190]  PHYSICAL EXAMINATION: General:  Adult male, sedated Neuro: Sedated, Pupils pinpoint and not reactive, +gag HEENT:  Upward gaze, normocephalic. Cardiovascular:  No MRG, Brady, s1/s2 Lungs: unlabored, on vent, crackles at bases  Abdomen:  Soft, active bowel sounds  Musculoskeletal:  No deformities  Skin:  Warm and dry, intact   LABS:  BMET  Recent Labs Lab 03/23/16 0645 03/23/16 0656 03/23/16 0807 03/24/16 0300  NA 138 138 139 140   K 5.8* 6.0* 5.1 3.2*  CL 106 106 110 112*  CO2 22  --  21* 20*  BUN 13 20 16  23*  CREATININE 1.39* 1.20 1.13 1.61*  GLUCOSE 256* 243* 126* 123*   Electrolytes  Recent Labs Lab 03/23/16 0645 03/23/16 0807 03/24/16 0300  CALCIUM 8.5* 8.4* 7.9*  MG  --  1.8  --   PHOS  --  3.9  --     CBC  Recent Labs Lab 03/23/16 0645 03/23/16 0656 03/23/16 0807 03/24/16 0300  WBC 10.6*  --  7.0 6.6  HGB 14.0 15.3 14.2 10.9*  HCT 44.2 45.0 44.8 32.8*  PLT 169  --  95* 118*    Coag's  Recent Labs Lab 03/23/16 0807  APTT 20*  INR 0.98    Sepsis Markers  Recent Labs Lab 03/23/16 0807 03/23/16 0950 03/23/16 1400 03/23/16 1700 03/24/16 0300  LATICACIDVEN  --  1.8 1.8 1.7  --   PROCALCITON 0.47  --   --   --  3.93    ABG  Recent Labs Lab 03/23/16 0710 03/23/16 0953 03/24/16 0400  PHART 7.127* 7.394 7.537*  PCO2ART 65.4* 29.4* 20.3*  PO2ART 49.0* 98.0 277*    Liver Enzymes  Recent Labs Lab 03/23/16 0645 03/23/16 0807 03/23/16 0902  AST 26 36 27  ALT 20 26 23  ALKPHOS 54 43 41  BILITOT 0.1* 0.8 0.7  ALBUMIN 3.3* 3.4* 3.4*    Cardiac Enzymes  Recent Labs Lab 03/23/16 0807 03/23/16 1700 03/23/16 2230  TROPONINI 0.13* 0.21* 0.24*    Glucose  Recent Labs Lab 03/23/16 1222 03/23/16 1607 03/23/16 2022 03/23/16 2340 03/24/16 0304 03/24/16 0755  GLUCAP 116* 80 126* 74 125* 121*    Imaging Mr Brain Wo Contrast  Result Date: 03/24/2016 CLINICAL DATA:  65 y/o M; found unresponsive with episode of cardiac arrest. EXAM: MRI HEAD WITHOUT CONTRAST TECHNIQUE: Multiplanar, multiecho pulse sequences of the brain and surrounding structures were obtained without intravenous contrast. COMPARISON:  03/23/2016 CT of the head. FINDINGS: Brain: Hemosiderin stained encephalomalacia of the right superior anterior frontal lobe and of the right lateral frontal lobe compatible with chronic infarction. Hematoma centered in the right posterior frontal lobe measuring  27 x 31 x 20 mm (AP x ML x CC) with T1 and T2 hyperintensity compatible with subacute hematoma. No diffusion hyperintensity to suggest acute/early subacute infarct. No hydrocephalus. No extra-axial collection. Vascular: Normal flow voids. Skull and upper cervical spine: Normal marrow signal. Sinuses/Orbits: Chronic left lamina papyracea fracture. Orbits are unremarkable. Mild paranasal sinus mucosal thickening, fluid in nasopharynx, and patchy opacification of ethmoid air cells, probably due to intubation. Other: None. IMPRESSION: 1. Subacute hematoma in the right posterior frontal lobe measuring up to 31 mm. 2. Chronic right anterior superior and right lateral frontal infarcts. 3. No evidence for acute/early subacute infarct or significant mass effect. Electronically Signed   By: Mitzi HansenLance  Furusawa-Stratton M.D.   On: 03/24/2016 05:49   Dg Chest Port 1 View  Result Date: 03/24/2016 CLINICAL DATA:  Hypoxia EXAM: PORTABLE CHEST 1 VIEW COMPARISON:  March 23, 2016 FINDINGS: Endotracheal tube tip is 7.0 cm above the carina. Central catheter tip is in superior vena cava. Nasogastric tube tip and side port are below the diaphragm. No pneumothorax. There has been significant interval resolution of alveolar edema compared to 1 day prior. Mild patchy interstitial alveolar edema do remain in the mid lower lung zones. No new opacity. Heart is upper normal in size with pulmonary vascularity within normal limits. No adenopathy. There is atherosclerotic calcification in the aorta. No bone lesions. IMPRESSION: Tube and catheter positions as described without evident pneumothorax. There remains patchy interstitial and alveolar edema, much less than 1 day prior. No new opacity. Stable cardiac silhouette. There is aortic atherosclerosis. Electronically Signed   By: Bretta BangWilliam  Woodruff III M.D.   On: 03/24/2016 08:37    STUDIES:  12/12 ct head > Hemorrhagic infarct in right frontal lobe 12/12 MRI Head > Subacute hematoma in  right posterior frontal lobe measuring 31 cm  12/12 EEG > Diffuse Slowing   CULTURES: 12/12 bc x 2>> 12/12 sputum>>  ANTIBIOTICS: Vancomycin 12/13 > Zosyn 12/13 >  SIGNIFICANT EVENTS: 12/12 pea arrest  LINES/TUBES: 12/12 et >> 12/12 l ij cvl>>  DISCUSSION: 65 yo AAM with hx of stroke x 2(last left hemorrhagic with rt hand residual weaknes) CAD with past MI, Etoh with 5th bourbon every 2 days, tobacco 1/2 ppd life long, HTN who was [facility]. He walked into sister's bedroom and co sob. Found in br with bloody sputum and unresponsive but breathing. EMS was activated and [facility] ED but in route had PEA arrest and required Epi x 1 and intubation with #7 ett. Found to be acidotic ph 7.12, pco2 66, po2 47 in ed. Homero FellersFrank pulmonary edema on exam  and cxr. Vent changes, diuresis, sedation change made with improved ventilation. He will need CT head to ro ICH prior to hypothermia protocol.  ASSESSMENT / PLAN:  PULMONARY A: Resp failure with pulmonary edema Hemoptysis COPD with tobacco abuse Profound hypoxia and hypercarbia  P:   Vent support  Decrease PEEP to 8 and FiO2 to 60% Decrease RR to 20 BD's Hold off weaning for now  CARDIOVASCULAR A:  Hx of MI HTN Heavy ETOH/Tobacco use P:  Consult Cardiology  Trend Trop Cardiac Monitoring  Wean Levophed for MAP goal >65 D/C propofol for hypotension  RENAL A:   Acute renal insuff (Baseline Crt 1.2)  Hyperkalemia(most likely due to acidosis) P:   Trend BMP Replace electrolytes as needed  KVO IVF  GASTROINTESTINAL A:   GI protection P:   PPI TF per nutrition   HEMATOLOGIC A:   No acute issue P:  Trend CBC  Transfuse per ICU protocol  INFECTIOUS A:   ?Aspiration Event during intubation  Procal 3.93, febrile, no WBC P:   Follow cultures  Trend WBC and Fever curve  Continue Vanc and Zosyn for now  ENDOCRINE A:   Hyperglycemia P:   SSI  NEUROLOGIC A:   Frontal Lobe ICH  Hx  of 2 strokes last 2 years with rt hand weakness H/O ETOH, Seizures  P:   RASS goal-1 D/C propofol PRN versed Fentanyl drip Thiamine, folic acid Neuro Following   FAMILY  - Updates: No family bedside 12/13.  - Inter-disciplinary family meet or Palliative Care meeting due by:  day 7  The patient is critically ill with multiple organ systems failure and requires high complexity decision making for assessment and support, frequent evaluation and titration of therapies, application of advanced monitoring technologies and extensive interpretation of multiple databases.   Critical Care Time devoted to patient care services described in this note is  35  Minutes. This time reflects time of care of this signee Dr Koren Bound. This critical care time does not reflect procedure time, or teaching time or supervisory time of PA/NP/Med student/Med Resident etc but could involve care discussion time.  Alyson Reedy, M.D. [facility] Pulmonary/Critical Care Medicine. Pager: [phone]. After hours pager: [phone].

## 2016-03-24 NOTE — Progress Notes (Signed)
[facility] ADULT ICU REPLACEMENT PROTOCOL FOR AM LAB REPLACEMENT ONLY  The patient does apply for the [facility] Adult ICU Electrolyte Replacment Protocol based on the criteria listed below:   1. Is GFR >/= 40 ml/min? Yes.    Patient's GFR today is 51 2. Is urine output >/= 0.5 ml/kg/hr for the last 6 hours? Yes.   Patient's UOP is 0.55 ml/kg/hr 3. Is BUN < 60 mg/dL? Yes.    Patient's BUN today is 23 4. Abnormal electrolyte(s):   K - 3.2 5. Ordered repletion with: PER PROTOCOL 6. If a panic level lab has been reported, has the CCM MD in charge been notified? Yes.  .   Physician:  Dr. Kandra Nicolas 03/24/2016 3:40 AM

## 2016-03-24 NOTE — Progress Notes (Signed)
Patient very agitated prn versed given, HR 150s, BP systolic 160, RR 68L, CCM paged, orders to restart propofol.

## 2016-03-24 NOTE — Progress Notes (Signed)
STROKE TEAM PROGRESS NOTE   HISTORY OF PRESENT ILLNESS (per record) Randall Patel is a 65 y.o. male with a history of a stroke in the right frontal region to was found down today. He was unresponsive but breathing. EMS was activated, he had PA arrest en route. Following his arrest, a CT was performed which showed possible acute stroke adjacent to his chronic infarct. Neurology was consulted due to this. His LKW is unclear. Patient was not administered IV t-PA for stroke. He was admitted to the cardiac ICU for further evaluation and treatment.   SUBJECTIVE (INTERVAL HISTORY)  patient in the cardiac ICU - intubated - TN at bedside. Patient was here 1 year ago - found to have 2 medical records.   OBJECTIVE Temp:  [97.4 F (36.3 C)-101.7 F (38.7 C)] 99.5 F (37.5 C) (12/13 1100) Pulse Rate:  [52-89] 60 (12/13 1100) Cardiac Rhythm: Sinus bradycardia (12/13 0800) Resp:  [0-32] 18 (12/13 1100) BP: (73-165)/(54-114) 89/54 (12/13 1100) SpO2:  [83 %-100 %] 100 % (12/13 1100) FiO2 (%):  [80 %-100 %] 80 % (12/13 0720) Weight:  [70.1 kg (154 lb 9.6 oz)] 70.1 kg (154 lb 9.6 oz) (12/13 0300)  CBC:  Recent Labs Lab 03/23/16 0645  03/23/16 0807 03/24/16 0300  WBC 10.6*  --  7.0 6.6  NEUTROABS 8.2*  --   --   --   HGB 14.0  < > 14.2 10.9*  HCT 44.2  < > 44.8 32.8*  MCV 88.2  --  87.3 83.5  PLT 169  --  95* 118*  < > = values in this interval not displayed.  Basic Metabolic Panel:  Recent Labs Lab 03/23/16 0807 03/24/16 0300  NA 139 140  K 5.1 3.2*  CL 110 112*  CO2 21* 20*  GLUCOSE 126* 123*  BUN 16 23*  CREATININE 1.13 1.61*  CALCIUM 8.4* 7.9*  MG 1.8  --   PHOS 3.9  --     Lipid Panel:    Component Value Date/Time   TRIG 97 03/23/2016 0950   HgbA1c: No results found for: HGBA1C Urine Drug Screen:    Component Value Date/Time   LABOPIA NONE DETECTED 03/23/2016 0650   COCAINSCRNUR NONE DETECTED 03/23/2016 0650   LABBENZ POSITIVE (A) 03/23/2016 0650   AMPHETMU NONE  DETECTED 03/23/2016 0650   THCU POSITIVE (A) 03/23/2016 0650   LABBARB NONE DETECTED 03/23/2016 0650      IMAGING  Ct Head Wo Contrast  Result Date: 03/23/2016 CLINICAL DATA:  Fall earlier today.  Episode of cardiac arrest EXAM: CT HEAD WITHOUT CONTRAST TECHNIQUE: Contiguous axial images were obtained from the base of the skull through the vertex without intravenous contrast. COMPARISON:  None. FINDINGS: Brain: There is mild diffuse atrophy. There is a prior infarct in the anteromedial right frontal lobe. There is decreased attenuation in the mid superior right frontal lobe with foci of hemorrhage within this lesion, likely acute partially hemorrhagic infarct. Infarct extends more inferiorly the level of the right internal capsule. Elsewhere gray-white compartments appear normal. There is no midline shift or extra-axial fluid collection. Vascular: There is no appreciable hyperdense vessel. There is calcification in each carotid siphon region. Skull: The bony calvarium appears intact. A defect in the medial left orbital wall may be congenital or residua of prior trauma. Sinuses/Orbits: There is opacification in multiple ethmoid air cells bilaterally. There is mucosal thickening in both maxillary antra, more severe on the right than on the left. There is opacification with air-fluid levels in  each sphenoid sinus. There is leftward deviation of the nasal septum. Orbits appear symmetric bilaterally. Other: Mastoid air cells are clear. IMPRESSION: Evidence indicating acute partially hemorrhagic infarct in the right frontal lobe mid to posterior aspect. Old infarct more anteriorly in the right frontal lobe. Mild underlying atrophy. Areas of paranasal sinus disease at multiple sites. Areas of arterial vascular calcification noted. Critical Value/emergent results were called by telephone at the time of interpretation on 03/23/2016 at 9:06 am to Dr. Burlene Arnt, ED physician , who verbally acknowledged these  results. Electronically Signed   By: Bretta Bang III M.D.   On: 03/23/2016 09:06   Mr Brain Wo Contrast  Result Date: 03/24/2016 CLINICAL DATA:  65 y/o M; found unresponsive with episode of cardiac arrest. EXAM: MRI HEAD WITHOUT CONTRAST TECHNIQUE: Multiplanar, multiecho pulse sequences of the brain and surrounding structures were obtained without intravenous contrast. COMPARISON:  03/23/2016 CT of the head. FINDINGS: Brain: Hemosiderin stained encephalomalacia of the right superior anterior frontal lobe and of the right lateral frontal lobe compatible with chronic infarction. Hematoma centered in the right posterior frontal lobe measuring 27 x 31 x 20 mm (AP x ML x CC) with T1 and T2 hyperintensity compatible with subacute hematoma. No diffusion hyperintensity to suggest acute/early subacute infarct. No hydrocephalus. No extra-axial collection. Vascular: Normal flow voids. Skull and upper cervical spine: Normal marrow signal. Sinuses/Orbits: Chronic left lamina papyracea fracture. Orbits are unremarkable. Mild paranasal sinus mucosal thickening, fluid in nasopharynx, and patchy opacification of ethmoid air cells, probably due to intubation. Other: None. IMPRESSION: 1. Subacute hematoma in the right posterior frontal lobe measuring up to 31 mm. 2. Chronic right anterior superior and right lateral frontal infarcts. 3. No evidence for acute/early subacute infarct or significant mass effect. Electronically Signed   By: Mitzi Hansen M.D.   On: 03/24/2016 05:49   Dg Chest Port 1 View  Result Date: 03/24/2016 CLINICAL DATA:  Hypoxia EXAM: PORTABLE CHEST 1 VIEW COMPARISON:  March 23, 2016 FINDINGS: Endotracheal tube tip is 7.0 cm above the carina. Central catheter tip is in superior vena cava. Nasogastric tube tip and side port are below the diaphragm. No pneumothorax. There has been significant interval resolution of alveolar edema compared to 1 day prior. Mild patchy interstitial alveolar  edema do remain in the mid lower lung zones. No new opacity. Heart is upper normal in size with pulmonary vascularity within normal limits. No adenopathy. There is atherosclerotic calcification in the aorta. No bone lesions. IMPRESSION: Tube and catheter positions as described without evident pneumothorax. There remains patchy interstitial and alveolar edema, much less than 1 day prior. No new opacity. Stable cardiac silhouette. There is aortic atherosclerosis. Electronically Signed   By: Bretta Bang III M.D.   On: 03/24/2016 08:37   Dg Chest Portable 1 View  Result Date: 03/23/2016 CLINICAL DATA:  Central line placement EXAM: PORTABLE CHEST 1 VIEW COMPARISON:  03/23/2016 FINDINGS: Left central line has been placed with the tip in the SVC. No pneumothorax. Bilateral severe airspace disease, right greater than left, unchanged. Endotracheal tube and NG tube are in stable position. Heart is normal size. IMPRESSION: Left central line tip in the SVC.  No pneumothorax. Stable severe bilateral airspace disease. Electronically Signed   By: Charlett Nose M.D.   On: 03/23/2016 08:29   Dg Chest Port 1 View  Result Date: 03/23/2016 CLINICAL DATA:  65 year old male with respiratory distress. Status post CPR and intubation. EXAM: PORTABLE CHEST 1 VIEW COMPARISON:  None. FINDINGS: Endotracheal tube  with tip approximately 7.7 cm above the carina. Diffuse bilateral airspace opacities noted which may represent multifocal pneumonia, ARDS, alveolar hemorrhage. There is no pleural effusion or pneumothorax. There is mild cardiomegaly. No acute osseous pathology identified. IMPRESSION: Endotracheal tube above the carina. Diffuse bilateral airspace opacities. Cardiomegaly. Electronically Signed   By: Elgie Collard M.D.   On: 03/23/2016 06:33   EEG  This sedated EEG is abnormal due to diffuse background suppression and slowing.    PHYSICAL EXAM Middle aged african [place] male who is intubated and sedated. .  Afebrile. Head is nontraumatic. Neck is supple without bruit.    Cardiac exam no murmur or gallop. Lungs are clear to auscultation. Distal pulses are well felt. Neurological Exam ;  patient is unresponsive. And will not follow any commands. Opens eyes to sternal rub with tonic upgaze deviation. Pupils 4 mm equal reactive. He has some spontaneous side to side head and neck movements. He does not blink to threat on either side. Fundi were not visualized. Patient has some flexion movements in the upper and lower extremities to sternal rub left side more than the right. No posturing noted. Plantars elicit dictation needs to withdraw response bilaterally. ASSESSMENT/PLAN Mr. Satish Gonsalez is a 65 y.o. male with history of HTN, cigarette smoker, CAD s/p MI, bourbon drinker and stroke with residual R hand weakness found down, unresponsive. PEA arrest in ED. CT showed a chronic hemorrhage, not related to presentation.   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 Related to fall, trauma, etc in the past  CT  R frontal lobe infarct,  Partially hemorrhagic. Adjacent old R frontal infarct  MRI  Subacute R posterior frontal hematoma. Old R anterior infarct.  HgbA1c pending  SCDs for VTE prophylaxis  Diet NPO time specified  aspirin 81 mg daily prior to admission  Patient counseled to be compliant with his antithrombotic medications  Ongoing aggressive stroke risk factor management  Therapy recommendations:  pending   Disposition:  pending   Hypertension  BP variable 92/57, 188/105  Hyperlipidemia  Home meds:  Had been on lipitor 80 mg daily in the past  Held 1 year ago due to ICH, resumed at discharge  Recommend to continue continue statin at discharge  History of Stroke  03/2015 Large acute right frontoparietal ICH with cerebral edema in  setting of hypertension, in ICU, on 3%  03/2012 R cortical motor strip infarcts  R frontal encephalomalacia  Concerning for embolic infarcts with low EF  Other Stroke Risk Factors  Advanced age  Heavy Cigarette smoker  Heavy ETOH abuse  positive THC and benzos  Family hx stroke (sister)  Coronary artery disease hx MI, s/p CEA arrest this admission. Hx PCI and stent 2 yrs ago  dental carries  Other Active Problems  Respiratory failure with pulmonary edema  hempotysis  COPD with tobacco abuse  profound hypoxia and hypercarbiaacute respiratory insufficiencyhyperkalemia  Hospital day # 1  BIBY,SHARON  [facility] Stroke Center See Amion for Pager information 03/24/2016 2:36 PM  I have personally examined this patient, reviewed notes, independently viewed imaging studies, participated in medical decision making and plan of care.ROS completed by me personally and pertinent positives fully documented  I have made any additions or clarifications directly to the above note. Agree with note above.  Patient clearly had a witnessed anoxic brain injury related event and clinical exam and  EEG suggests global hypoxic injury. CT scan and MRI show a late to subacute right frontal hematoma which has been noted on previous imaging. I do not think the acute presentation is related to this hematoma hence I do not recommend any further neurovascular workup. Continue supportive care and his prognosis is related to the extent of his anoxic brain injury. Stroke team will sign off at the present time. You may reconsult neurology team for further input into his prognosis if necessary in the future. This patient is critically ill and at significant risk of neurological worsening, death and care requires constant monitoring of vital signs, hemodynamics,respiratory and cardiac monitoring, extensive review of multiple databases, frequent neurological assessment, discussion with family, other specialists and  medical decision making of high complexity.I have made any additions or clarifications directly to the above note.This critical care time does not reflect procedure time, or teaching time or supervisory time of PA/NP/Med Resident etc but could involve care discussion time.  I spent 30 minutes of neurocritical care time  in the care of  this patient.      Delia Heady, MD Medical Director [facility] Stroke Center Pager: [phone] 03/24/2016 5:22 PM  To contact Stroke Continuity provider, please refer to [email or web address]. After hours, contact General Neurology

## 2016-03-25 ENCOUNTER — Inpatient Hospital Stay (HOSPITAL_COMMUNITY): Payer: Medicare HMO

## 2016-03-25 DIAGNOSIS — I10 Essential (primary) hypertension: Secondary | ICD-10-CM

## 2016-03-25 LAB — CBC
HEMATOCRIT: 32.4 % — AB (ref 39.0–52.0)
HEMOGLOBIN: 10.5 g/dL — AB (ref 13.0–17.0)
MCH: 27.9 pg (ref 26.0–34.0)
MCHC: 32.4 g/dL (ref 30.0–36.0)
MCV: 85.9 fL (ref 78.0–100.0)
Platelets: 115 10*3/uL — ABNORMAL LOW (ref 150–400)
RBC: 3.77 MIL/uL — ABNORMAL LOW (ref 4.22–5.81)
RDW: 14.8 % (ref 11.5–15.5)
WBC: 9.2 10*3/uL (ref 4.0–10.5)

## 2016-03-25 LAB — MAGNESIUM
MAGNESIUM: 1.8 mg/dL (ref 1.7–2.4)
Magnesium: 2 mg/dL (ref 1.7–2.4)
Magnesium: 0.7 mg/dL — CL (ref 1.7–2.4)

## 2016-03-25 LAB — CULTURE, RESPIRATORY W GRAM STAIN: Special Requests: NORMAL

## 2016-03-25 LAB — BLOOD GAS, ARTERIAL
ACID-BASE DEFICIT: 2.7 mmol/L — AB (ref 0.0–2.0)
Bicarbonate: 22.4 mmol/L (ref 20.0–28.0)
Drawn by: 39898
FIO2: 50
LHR: 18 {breaths}/min
O2 SAT: 95.7 %
PEEP: 8 cmH2O
PH ART: 7.327 — AB (ref 7.350–7.450)
Patient temperature: 98.6
VT: 500 mL
pCO2 arterial: 44 mmHg (ref 32.0–48.0)
pO2, Arterial: 88.7 mmHg (ref 83.0–108.0)

## 2016-03-25 LAB — BASIC METABOLIC PANEL
ANION GAP: 8 (ref 5–15)
BUN: 18 mg/dL (ref 6–20)
CHLORIDE: 109 mmol/L (ref 101–111)
CO2: 22 mmol/L (ref 22–32)
Calcium: 8.3 mg/dL — ABNORMAL LOW (ref 8.9–10.3)
Creatinine, Ser: 1.03 mg/dL (ref 0.61–1.24)
GFR calc Af Amer: >60 mL/min (ref >60)
GLUCOSE: 114 mg/dL — AB (ref 65–99)
POTASSIUM: 3.8 mmol/L (ref 3.5–5.1)
Sodium: 139 mmol/L (ref 135–145)

## 2016-03-25 LAB — GLUCOSE, CAPILLARY
GLUCOSE-CAPILLARY: 128 mg/dL — AB (ref 65–99)
GLUCOSE-CAPILLARY: 127 mg/dL — AB (ref 65–99)
GLUCOSE-CAPILLARY: 113 mg/dL — AB (ref 65–99)
Glucose-Capillary: 121 mg/dL — ABNORMAL HIGH (ref 65–99)
Glucose-Capillary: 126 mg/dL — ABNORMAL HIGH (ref 65–99)

## 2016-03-25 LAB — PHOSPHORUS
PHOSPHORUS: 2.7 mg/dL (ref 2.5–4.6)
PHOSPHORUS: 2.2 mg/dL — AB (ref 2.5–4.6)

## 2016-03-25 LAB — CULTURE, RESPIRATORY: CULTURE: NORMAL

## 2016-03-25 LAB — TRIGLYCERIDES: Triglycerides: 113 mg/dL (ref <150)

## 2016-03-25 LAB — PROCALCITONIN: PROCALCITONIN: 1.67 ng/mL

## 2016-03-25 MED ORDER — LORAZEPAM 2 MG/ML IJ SOLN
1.0000 mg | Freq: Four times a day (QID) | INTRAMUSCULAR | Status: DC
  Administered 2016-03-25 – 2016-03-27 (×8): 1 mg via INTRAVENOUS
  Filled 2016-03-25 (×8): qty 1

## 2016-03-25 MED ORDER — ONDANSETRON HCL 4 MG/2ML IJ SOLN
4.0000 mg | Freq: Four times a day (QID) | INTRAMUSCULAR | Status: DC | PRN
  Administered 2016-03-26: 4 mg via INTRAVENOUS
  Filled 2016-03-25 (×2): qty 2

## 2016-03-25 MED ORDER — ONDANSETRON HCL 4 MG/2ML IJ SOLN
INTRAMUSCULAR | Status: AC
  Administered 2016-03-25: 4 mg
  Filled 2016-03-25: qty 2

## 2016-03-25 MED ORDER — LORAZEPAM 2 MG/ML IJ SOLN: 1.0000 mg | Freq: Four times a day (QID) | INTRAMUSCULAR | Status: DC

## 2016-03-25 MED ORDER — METOPROLOL TARTRATE 5 MG/5ML IV SOLN
5.0000 mg | Freq: Once | INTRAVENOUS | Status: AC
  Administered 2016-03-25: 5 mg via INTRAVENOUS

## 2016-03-25 MED ORDER — DEXMEDETOMIDINE HCL IN NACL 200 MCG/50ML IV SOLN
0.4000 ug/kg/h | INTRAVENOUS | Status: DC
  Administered 2016-03-25: 0.4 ug/kg/h via INTRAVENOUS
  Administered 2016-03-25: 0.8 ug/kg/h via INTRAVENOUS
  Administered 2016-03-25: 0.6 ug/kg/h via INTRAVENOUS
  Administered 2016-03-25: 0.8 ug/kg/h via INTRAVENOUS
  Administered 2016-03-26: 0.7 ug/kg/h via INTRAVENOUS
  Administered 2016-03-26 (×2): 0.6 ug/kg/h via INTRAVENOUS
  Administered 2016-03-26: 0.4 ug/kg/h via INTRAVENOUS
  Administered 2016-03-26: 0.8 ug/kg/h via INTRAVENOUS
  Administered 2016-03-26: 1 ug/kg/h via INTRAVENOUS
  Administered 2016-03-26: 0.6 ug/kg/h via INTRAVENOUS
  Administered 2016-03-27: 0.4 ug/kg/h via INTRAVENOUS
  Administered 2016-03-27: 0.7 ug/kg/h via INTRAVENOUS
  Administered 2016-03-27: 1 ug/kg/h via INTRAVENOUS
  Administered 2016-03-27: 0.7 ug/kg/h via INTRAVENOUS
  Administered 2016-03-27: 1 ug/kg/h via INTRAVENOUS
  Administered 2016-03-27 – 2016-03-28 (×3): 0.7 ug/kg/h via INTRAVENOUS
  Filled 2016-03-25 (×19): qty 50

## 2016-03-25 MED ORDER — METOPROLOL TARTRATE 5 MG/5ML IV SOLN
INTRAVENOUS | Status: AC
  Filled 2016-03-25: qty 5

## 2016-03-25 MED ORDER — ONDANSETRON HCL 4 MG/2ML IJ SOLN: 4.0000 mg | Freq: Four times a day (QID) | INTRAMUSCULAR | Status: DC

## 2016-03-25 MED ORDER — LORAZEPAM 2 MG/ML IJ SOLN: 1.0000 mg | INTRAMUSCULAR | Status: DC

## 2016-03-25 MED ORDER — FUROSEMIDE 10 MG/ML IJ SOLN
40.0000 mg | Freq: Once | INTRAMUSCULAR | Status: AC
  Administered 2016-03-25: 40 mg via INTRAVENOUS
  Filled 2016-03-25: qty 4

## 2016-03-25 MED ORDER — NICARDIPINE HCL IN NACL 20-0.86 MG/200ML-% IV SOLN
3.0000 mg/h | INTRAVENOUS | Status: DC
Start: 2016-03-25 — End: 2016-03-28
  Administered 2016-03-25: 5 mg/h via INTRAVENOUS
  Administered 2016-03-26 (×2): 3 mg/h via INTRAVENOUS
  Administered 2016-03-27: 13 mg/h via INTRAVENOUS
  Administered 2016-03-27: 5 mg/h via INTRAVENOUS
  Administered 2016-03-27: 13 mg/h via INTRAVENOUS
  Administered 2016-03-27: 5 mg/h via INTRAVENOUS
  Administered 2016-03-27: 15 mg/h via INTRAVENOUS
  Administered 2016-03-27: 13 mg/h via INTRAVENOUS
  Administered 2016-03-27 (×2): 5 mg/h via INTRAVENOUS
  Administered 2016-03-28: 10 mg/h via INTRAVENOUS
  Administered 2016-03-28: 5 mg/h via INTRAVENOUS
  Administered 2016-03-28: 10 mg/h via INTRAVENOUS
  Administered 2016-03-28: 3 mg/h via INTRAVENOUS
  Administered 2016-03-28: 5 mg/h via INTRAVENOUS
  Filled 2016-03-25 (×16): qty 200

## 2016-03-25 NOTE — Progress Notes (Signed)
Patient Name: Randall Patel Date of Encounter: 03/25/2016  Primary Cardiologist: Dr. [id number] Problem List     Active Problems:   Cardiac arrest [facility])     Subjective   Sedate on vent. Was agitated and following commands earlier. Trying to extube (STach 140)  Inpatient Medications    Scheduled Meds: . chlorhexidine gluconate (MEDLINE KIT)  15 mL Mouth Rinse BID  . famotidine (PEPCID) IV  20 mg Intravenous Q12H  . feeding supplement (PRO-STAT SUGAR FREE 64)  30 mL Per Tube BID  . folic acid  1 mg Intravenous Daily  . insulin aspart  0-9 Units Subcutaneous Q4H  . ipratropium-albuterol  3 mL Nebulization Q6H  . LORazepam  1 mg Intravenous Q6H  . mouth rinse  15 mL Mouth Rinse 10 times per day  . pantoprazole (PROTONIX) IV  40 mg Intravenous QHS  . piperacillin-tazobactam (ZOSYN)  IV  3.375 g Intravenous Q8H  . sodium chloride flush  10-40 mL Intracatheter Q12H  . thiamine injection  100 mg Intravenous Daily  . vancomycin  750 mg Intravenous Q12H   Continuous Infusions: . sodium chloride 10 mL/hr at 03/23/16 2000  . dexmedetomidine 0.4 mcg/kg/hr (03/25/16 1058)  . feeding supplement (VITAL AF 1.2 CAL) 1,000 mL (03/25/16 1058)  . fentaNYL infusion INTRAVENOUS 150 mcg/hr (03/25/16 1058)   PRN Meds: acetaminophen (TYLENOL) oral liquid 160 mg/5 mL, fentaNYL, Influenza vac split quadrivalent PF, pneumococcal 23 valent vaccine, sodium chloride flush   Vital Signs    Vitals:   03/25/16 1045 03/25/16 1100 03/25/16 1126 03/25/16 1200  BP:  128/88  103/64  Pulse: (!) 147 88 71 67  Resp: (!) '29 15 18 18  ' Temp: 99.7 F (37.6 C) 99.9 F (37.7 C)  99.9 F (37.7 C)  TempSrc:    Core (Comment)  SpO2: 97% 98% 100% 99%  Weight:      Height:        Intake/Output Summary (Last 24 hours) at 03/25/16 1311 Last data filed at 03/25/16 1200  Gross per 24 hour  Intake          2353.71 ml  Output             3560 ml  Net         -1206.29 ml   Filed Weights   03/23/16  0649 03/24/16 0300 03/25/16 0300  Weight: 176 lb 5.9 oz (80 kg) 154 lb 9.6 oz (70.1 kg) 153 lb 14.4 oz (69.8 kg)    Physical Exam    GEN: Sedate thin,ill appearing.  HEENT: Grossly normal. ETT Neck: Supple, no JVD, carotid bruits, or masses. Cardiac: RRR, no murmurs, rubs, or gallops. No clubbing, cyanosis, edema.  Radials/DP/PT 2+ and equal bilaterally.  Respiratory:  Respirations regular and unlabored, clear to auscultation bilaterally. GI: Soft, nontender, nondistended, BS + x 4. MS: no deformity or atrophy. Skin: warm and dry, no rash. Neuro:  Strength and sensation are intact. Psych: unable  Labs    CBC  Recent Labs  03/23/16 0645  03/24/16 0300 03/25/16 0325  WBC 10.6*  < > 6.6 9.2  NEUTROABS 8.2*  --   --   --   HGB 14.0  < > 10.9* 10.5*  HCT 44.2  < > 32.8* 32.4*  MCV 88.2  < > 83.5 85.9  PLT 169  < > 118* 115*  < > = values in this interval not displayed. Basic Metabolic Panel  Recent Labs  03/23/16 0807 03/24/16 0300 03/25/16 0325  NA 139 140 139  K 5.1 3.2* 3.8  CL 110 112* 109  CO2 21* 20* 22  GLUCOSE 126* 123* 114*  BUN 16 23* 18  CREATININE 1.13 1.61* 1.03  CALCIUM 8.4* 7.9* 8.3*  MG 1.8  --  2.0  PHOS 3.9  --  2.7   Liver Function Tests  Recent Labs  03/23/16 0902 03/24/16 1002  AST 27 20  ALT 23 16*  ALKPHOS 41 29*  BILITOT 0.7 1.2  PROT 6.6 5.4*  ALBUMIN 3.4* 2.7*    Recent Labs  03/23/16 0807  LIPASE 28  AMYLASE 55   Cardiac Enzymes  Recent Labs  03/23/16 2230 03/24/16 1002 03/24/16 1515  TROPONINI 0.24* 0.21* 0.21*   BNP Invalid input(s): POCBNP D-Dimer No results for input(s): DDIMER in the last 72 hours. Hemoglobin A1C No results for input(s): HGBA1C in the last 72 hours. Fasting Lipid Panel  Recent Labs  03/25/16 0325  TRIG 113   Thyroid Function Tests No results for input(s): TSH, T4TOTAL, T3FREE, THYROIDAB in the last 72 hours.  Invalid input(s): FREET3  Telemetry    Sinus tach 140 (not  flutter) when agitated. Now SR 39 - Personally Reviewed  ECG    NO ST elevation - Personally Reviewed  Radiology    Mr Brain Wo Contrast  Result Date: 03/24/2016 CLINICAL DATA:  65 y/o M; found unresponsive with episode of cardiac arrest. EXAM: MRI HEAD WITHOUT CONTRAST TECHNIQUE: Multiplanar, multiecho pulse sequences of the brain and surrounding structures were obtained without intravenous contrast. COMPARISON:  03/23/2016 CT of the head. FINDINGS: Brain: Hemosiderin stained encephalomalacia of the right superior anterior frontal lobe and of the right lateral frontal lobe compatible with chronic infarction. Hematoma centered in the right posterior frontal lobe measuring 27 x 31 x 20 mm (AP x ML x CC) with T1 and T2 hyperintensity compatible with subacute hematoma. No diffusion hyperintensity to suggest acute/early subacute infarct. No hydrocephalus. No extra-axial collection. Vascular: Normal flow voids. Skull and upper cervical spine: Normal marrow signal. Sinuses/Orbits: Chronic left lamina papyracea fracture. Orbits are unremarkable. Mild paranasal sinus mucosal thickening, fluid in nasopharynx, and patchy opacification of ethmoid air cells, probably due to intubation. Other: None. IMPRESSION: 1. Subacute hematoma in the right posterior frontal lobe measuring up to 31 mm. 2. Chronic right anterior superior and right lateral frontal infarcts. 3. No evidence for acute/early subacute infarct or significant mass effect. Electronically Signed   By: Kristine Garbe M.D.   On: 03/24/2016 05:49   Dg Chest Port 1 View  Result Date: 03/25/2016 CLINICAL DATA:  Endotracheal tube EXAM: PORTABLE CHEST 1 VIEW COMPARISON:  03/24/2016 FINDINGS: Endotracheal tube in good position. Left jugular catheter tip in the SVC. NG tube enters the stomach. No pneumothorax. Minimal bibasilar atelectasis unchanged. Resolution of pulmonary edema. IMPRESSION: Endotracheal tube in good position Resolution of pulmonary  edema.  Mild bibasilar atelectasis. Electronically Signed   By: Franchot Gallo M.D.   On: 03/25/2016 07:10   Dg Chest Port 1 View  Result Date: 03/24/2016 CLINICAL DATA:  Hypoxia EXAM: PORTABLE CHEST 1 VIEW COMPARISON:  March 23, 2016 FINDINGS: Endotracheal tube tip is 7.0 cm above the carina. Central catheter tip is in superior vena cava. Nasogastric tube tip and side port are below the diaphragm. No pneumothorax. There has been significant interval resolution of alveolar edema compared to 1 day prior. Mild patchy interstitial alveolar edema do remain in the mid lower lung zones. No new opacity. Heart is upper normal in size with  pulmonary vascularity within normal limits. No adenopathy. There is atherosclerotic calcification in the aorta. No bone lesions. IMPRESSION: Tube and catheter positions as described without evident pneumothorax. There remains patchy interstitial and alveolar edema, much less than 1 day prior. No new opacity. Stable cardiac silhouette. There is aortic atherosclerosis. Electronically Signed   By: Lowella Grip III M.D.   On: 03/24/2016 08:37    Cardiac Studies   Cath 2013  1. NSTEMI 2. Triple vessel CAD with occluded RCA, culprit vessel. 3. Successful PTCA/DES x 1 distal RCA 4. Successful PTCA with balloon angioplasty only of the posterolateral branch.  5. Segmental LV systolic dysfunction  Patient Profile     65 year old with cardiac arrest (PEA) with known CAD DES to dRCA, prior EF 45%, mildly elevated flat troponin, acute respiratory failure, ETOH heavy use, subacute hematoma of right posterior frontal lobe.  Assessment & Plan    1. Cardiac Arrest - initially reported acute dyspnea but arrested upon EMS arrival. Initially in PEA then Asystole. CPR was performed for over 6 minutes with administration of Epinephrine x1 with ROSC.  - primary cardiac etiology less likely with no acute ST or T-wave changes and flat troponin trend. - will obtain echocardiogram  to assess LV function and wall motion. Not a current cardiac catheterization candidate with his right frontal lobe hematoma. No Heparin given hematoma.   2. CAD - cath in 03/2012 showed 30% ostial LM stenosis, 30% pLAD stenosis, 60% mid-LADm 30% pCx, 3rd OM which was small in size with diffuse 90% stenosis involving both branches, and RCA with 50% mid-stenosis and 100% distal occlusion. This was treated with DES x1 to the dRCA with successful PTCA with balloon angioplasty only of the posterolateral branch being perfomed.  - plan to resume BB, ACE-I, and statin once BP remains stable.   3. Nonischemic Cardiomyopathy - EF 40-45% by echocardiogram in 03/2015. Does not appear volume overloaded by physical exam. - plan to restart BB and ACE-I as hemodynamics allow.  - echo pending.   4. Elevated Troponin - cyclic troponin values at 0.04, 0.13, 0.21, 0.24, and 0.21. Likely secondary to demand ischemia in the setting of his cardiac arrest.  5. Respiratory Failure with Pulmonary Edema and Hemoptysis - remains intubated and sedated. On broad-spectrum antibiotics.  - per Critical Care - when awakened for planned extubation, HR increased to 140 sinus tachycardia. Now that he is sedate again, NSR 60. Ativan - ETOH  6. Subacute Hematoma - MRI of brain this admission shows subacute hematoma in the right posterior frontal lobe measuring up to 31 mm. - Neurology following.   Critical care time 35 minutes spent with patient,  assessing data, in this critically ill gentleman with multisystem organ failure.  Signed, Candee Furbish, MD  03/25/2016, 1:11 PM

## 2016-03-25 NOTE — Progress Notes (Signed)
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 He walked into sister's bedroom and co sob. Found in br with bloody sputum and unresponsive but breathing. EMS was activated and [facility] but in route had PEA arrest and required Epi x 1 and intubation with #7 ett. Found to be acidotic ph 7.12, pco2 66, po2 47 in ed. Homero FellersFrank pulmonary edema on exam and cxr. Vent changes, diuresis, sedation change made with improved ventilation. He will need CT head to ro ICH prior to hypothermia protocol.  SUBJECTIVE:  No events overnight. This am remains on propofol and fentanyl gtt.   VITAL SIGNS: BP (!) 173/90 (BP Location: Right Arm)   Pulse 96   Temp 99.5 F (37.5 C) (Core (Comment))   Resp 18   Ht 5\' 6"  (1.676 m)   Wt 69.8 kg (153 lb 14.4 oz)   SpO2 99% Comment: Simultaneous filing. User may not have seen previous data.  BMI 24.84 kg/m   HEMODYNAMICS: CVP:  [12 mmHg-14 mmHg] 12 mmHg  VENTILATOR SETTINGS: Vent Mode: PRVC FiO2 (%):  [40 %-60 %] 40 % Set Rate:  [18 bmp] 18 bmp Vt Set:  [60 mL-500 mL] 500 mL PEEP:  [8 cmH20] 8 cmH20 Plateau Pressure:  [19 cmH20-24 cmH20] 24 cmH20  INTAKE / OUTPUT: I/O last 3 completed shifts: In: 3707.5 [I.V.:1252.5; NG/GT:990; IV Piggyback:1465] Out: 2065 [Urine:2065]  PHYSICAL EXAMINATION: General:  Adult male, on vent, lying in bed  Neuro: Sedated, follows commands, moves all extremities, pupils intact, tracks HEENT:  ETT in place, normocephalic. Cardiovascular:  No MRG, Brady, s1/s2 Lungs: abored, on vent, crackles at bases, hemoptysis   Abdomen:  Soft, active bowel sounds   Musculoskeletal:  No deformities  Skin:  Warm and dry, intact   LABS:  BMET  Recent Labs Lab 03/23/16 0807 03/24/16 0300 03/25/16 0325  NA 139 140 139  K 5.1 3.2* 3.8  CL 110 112* 109  CO2 21* 20* 22  BUN 16 23* 18  CREATININE 1.13 1.61* 1.03  GLUCOSE 126* 123* 114*   Electrolytes  Recent Labs Lab 03/23/16 0807 03/24/16 0300 03/25/16 0325  CALCIUM 8.4* 7.9* 8.3*  MG 1.8  --  2.0  PHOS 3.9  --  2.7    CBC  Recent Labs Lab 03/23/16 0807 03/24/16 0300 03/25/16 0325  WBC 7.0 6.6 9.2  HGB 14.2 10.9* 10.5*  HCT 44.8 32.8* 32.4*  PLT 95* 118* 115*    Coag's  Recent Labs Lab 03/23/16 0807  APTT 20*  INR 0.98    Sepsis Markers  Recent Labs Lab 03/23/16 0807 03/23/16 0950 03/23/16 1400 03/23/16 1700 03/24/16 0300 03/25/16 0325  LATICACIDVEN  --  1.8 1.8 1.7  --   --   PROCALCITON 0.47  --   --   --  3.93 1.67    ABG  Recent Labs Lab 03/23/16 0953 03/24/16 0400 03/25/16 0340  PHART 7.394 7.537* 7.327*  PCO2ART 29.4* 20.3* 44.0  PO2ART 98.0 277* 88.7    Liver Enzymes  Recent Labs Lab 03/23/16 0807 03/23/16 0902 03/24/16 1002  AST  36 27 20  ALT 26 23 16*  ALKPHOS 43 41 29*  BILITOT 0.8 0.7 1.2  ALBUMIN 3.4* 3.4* 2.7*    Cardiac Enzymes  Recent Labs Lab 03/23/16 2230 03/24/16 1002 03/24/16 1515  TROPONINI 0.24* 0.21* 0.21*    Glucose  Recent Labs Lab 03/24/16 1144 03/24/16 1600 03/24/16 2045 03/24/16 2335 03/25/16 0325 03/25/16 0829  GLUCAP 116* 108* 134* 111* 121* 128*    Imaging Dg Chest Port 1 View  Result Date: 03/25/2016 CLINICAL DATA:  Endotracheal tube EXAM: PORTABLE CHEST 1 VIEW COMPARISON:  03/24/2016 FINDINGS: Endotracheal tube in good position. Left jugular catheter tip in the SVC. NG tube enters the stomach. No pneumothorax. Minimal bibasilar atelectasis unchanged. Resolution of pulmonary edema. IMPRESSION: Endotracheal tube in good position Resolution of pulmonary edema.  Mild bibasilar  atelectasis. Electronically Signed   By: Marlan Palau M.D.   On: 03/25/2016 07:10    STUDIES:  12/12 ct head > Hemorrhagic infarct in right frontal lobe 12/12 MRI Head > Subacute hematoma in right posterior frontal lobe measuring 31 cm  12/12 EEG > Diffuse Slowing   CULTURES: 12/12 bc x 2>> 12/12 sputum>>  ANTIBIOTICS: Vancomycin 12/13 > Zosyn 12/13 >  SIGNIFICANT EVENTS: 12/12 pea arrest  LINES/TUBES: 12/12 et >> 12/12 l ij cvl>>  DISCUSSION: 65 yo AAM with hx of stroke x 2(last left hemorrhagic with rt hand residual weaknes) CAD with past MI, Etoh with 5th bourbon every 2 days, tobacco 1/2 ppd life long, HTN who was admitteD 12/12 to [facility] ed. He walked into sister's bedroom and co sob. Found in br with bloody sputum and unresponsive but breathing. EMS was activated and they transported to [facility] ED but in route had PEA arrest and required Epi x 1 and intubation with #7 ett. Found to be acidotic ph 7.12, pco2 66, po2 47 in ed. [name] pulmonary edema on exam and cxr. Vent changes, diuresis, sedation change made with improved ventilation. He will need CT head to ro ICH prior to hypothermia protocol.  ASSESSMENT / PLAN:  PULMONARY A: Resp failure with pulmonary edema Hemoptysis COPD with tobacco abuse Profound hypoxia and hypercarbia  P:   Vent support  Wean as tolerated  BD's   CARDIOVASCULAR A:  Hx of MI HTN Heavy ETOH/Tobacco use P:  Cardiology following Cardiac Monitoring  Echo Pending   RENAL A:   Acute renal insuff (Baseline Crt 1.2)  Hyperkalemia(most likely due to acidosis) P:   Trend BMP Lasix 40 meq  Replace electrolytes as needed  KVO IVF  GASTROINTESTINAL A:   GI protection P:   PPI TF per nutrition   HEMATOLOGIC A:   No acute issue P:  Trend CBC  Transfuse per ICU protocol  INFECTIOUS A:   ?Aspiration Event during intubation  Procal 3.93, febrile, no WBC P:   Follow cultures  Trend WBC and Fever curve  Continue Vanc and Zosyn  for now  ENDOCRINE A:   Hyperglycemia P:   SSI  NEUROLOGIC A:   Frontal Lobe ICH  Hx of 2 strokes last 2 years with rt hand weakness H/O ETOH, Seizures  P:   RASS goal-1 Wean Propofol drip  Scheduled ativan with goal to wean propofol gtt  Fentanyl drip Thiamine, folic acid Neuro and Neuro Stroke Following   FAMILY  - Updates: No family bedside 12/13.  - Inter-disciplinary family meet or Palliative Care meeting due by:  day 7  Jovita Kussmaul, AG-ACNP [facility]  Pgr: [phone]  PCCM  Pgr: [phone]  Attending Note:  65 year old male with etoh abuse history presenting with ICH and PEA arrest.  Patient this AM was on propofol, when stopped patient became very agitated and HR increased to 140's.  On exam, patient is following command but remains very agitated.  I reviewed CXR myself, ETT ok.  Continue diureses as ordered.  Hold weaning for now.  Change propofol to precedex.  KVO IVF.  Continue TF.  Will reevaluate again in AM.  No family bedside.  The patient is critically ill with multiple organ systems failure and requires high complexity decision making for assessment and support, frequent evaluation and titration of therapies, application of advanced monitoring technologies and extensive interpretation of multiple databases.   Critical Care Time devoted to patient care services described in this note is  35  Minutes. This time reflects time of care of this signee Dr Koren Bound. This critical care time does not reflect procedure time, or teaching time or supervisory time of PA/NP/Med student/Med Resident etc but could involve care discussion time.  Alyson Reedy, M.D. [facility] Pulmonary/Critical Care Medicine. Pager: [phone]. After hours pager: [phone].

## 2016-03-25 NOTE — Progress Notes (Signed)
eLink Physician-Brief Progress Note Patient Name: Eugean Stake DOB: 1950-07-23 MRN: [id number]   Date of Service  03/25/2016  HPI/Events of Note  Hypertension - BP = 204/107. History of subacute ICH.   eICU Interventions  Will  Order: 1. Nicardipine IV infusion. Titrate to SBP < 160.     Intervention Category Major Interventions: Hypertension - evaluation and management  Sommer,Steven Eugene 03/25/2016, 9:05 PM

## 2016-03-25 NOTE — Progress Notes (Signed)
Tried to wean patient with respiratory, HR 150s, per Dr. Molli Knock give 5mg  IV lopressor, Pt still weaning with a HR 140s, RR 40s, Per dr. Molli Knock place patient back on PRVC and d/c propofol and start precedex. Will continue to monitor closely.

## 2016-03-26 ENCOUNTER — Inpatient Hospital Stay (HOSPITAL_COMMUNITY): Payer: Medicare HMO

## 2016-03-26 DIAGNOSIS — R000 Tachycardia, unspecified: Secondary | ICD-10-CM

## 2016-03-26 DIAGNOSIS — J810 Acute pulmonary edema: Secondary | ICD-10-CM

## 2016-03-26 DIAGNOSIS — J9690 Respiratory failure, unspecified, unspecified whether with hypoxia or hypercapnia: Secondary | ICD-10-CM

## 2016-03-26 DIAGNOSIS — I255 Ischemic cardiomyopathy: Secondary | ICD-10-CM

## 2016-03-26 LAB — GLUCOSE, CAPILLARY
GLUCOSE-CAPILLARY: 177 mg/dL — AB (ref 65–99)
GLUCOSE-CAPILLARY: 138 mg/dL — AB (ref 65–99)
Glucose-Capillary: 124 mg/dL — ABNORMAL HIGH (ref 65–99)
Glucose-Capillary: 142 mg/dL — ABNORMAL HIGH (ref 65–99)
Glucose-Capillary: 147 mg/dL — ABNORMAL HIGH (ref 65–99)
Glucose-Capillary: 156 mg/dL — ABNORMAL HIGH (ref 65–99)
Glucose-Capillary: 182 mg/dL — ABNORMAL HIGH (ref 65–99)

## 2016-03-26 LAB — MAGNESIUM
Magnesium: 1.9 mg/dL (ref 1.7–2.4)
Magnesium: 1.8 mg/dL (ref 1.7–2.4)

## 2016-03-26 LAB — BLOOD GAS, ARTERIAL
ACID-BASE EXCESS: 1.2 mmol/L (ref 0.0–2.0)
BICARBONATE: 24.7 mmol/L (ref 20.0–28.0)
DRAWN BY: 39898
FIO2: 40
O2 Saturation: 98.2 %
PEEP: 5 cmH2O
PH ART: 7.46 — AB (ref 7.350–7.450)
Patient temperature: 98.6
RATE: 18 resp/min
VT: 500 mL
pCO2 arterial: 35.2 mmHg (ref 32.0–48.0)
pO2, Arterial: 115 mmHg — ABNORMAL HIGH (ref 83.0–108.0)

## 2016-03-26 LAB — BASIC METABOLIC PANEL
Anion gap: 7 (ref 5–15)
BUN: 6 mg/dL (ref 6–20)
CO2: 24 mmol/L (ref 22–32)
CREATININE: 0.88 mg/dL (ref 0.61–1.24)
Calcium: 8.8 mg/dL — ABNORMAL LOW (ref 8.9–10.3)
Chloride: 105 mmol/L (ref 101–111)
GFR calc Af Amer: >60 mL/min (ref >60)
Glucose, Bld: 121 mg/dL — ABNORMAL HIGH (ref 65–99)
Potassium: 2.9 mmol/L — ABNORMAL LOW (ref 3.5–5.1)
SODIUM: 136 mmol/L (ref 135–145)

## 2016-03-26 LAB — CBC
HCT: 31 % — ABNORMAL LOW (ref 39.0–52.0)
Hemoglobin: 10.2 g/dL — ABNORMAL LOW (ref 13.0–17.0)
MCH: 27.5 pg (ref 26.0–34.0)
MCHC: 32.9 g/dL (ref 30.0–36.0)
MCV: 83.6 fL (ref 78.0–100.0)
PLATELETS: 121 10*3/uL — AB (ref 150–400)
RBC: 3.71 MIL/uL — ABNORMAL LOW (ref 4.22–5.81)
RDW: 14.2 % (ref 11.5–15.5)
WBC: 7.6 10*3/uL (ref 4.0–10.5)

## 2016-03-26 LAB — PHOSPHORUS
PHOSPHORUS: 1.9 mg/dL — AB (ref 2.5–4.6)
PHOSPHORUS: 2.2 mg/dL — AB (ref 2.5–4.6)

## 2016-03-26 LAB — ECHOCARDIOGRAM COMPLETE
Height: 66 in
Weight: 2337.6 oz

## 2016-03-26 MED ORDER — LEVOFLOXACIN IN D5W 750 MG/150ML IV SOLN
750.0000 mg | INTRAVENOUS | Status: AC
  Administered 2016-03-26 – 2016-03-28 (×3): 750 mg via INTRAVENOUS
  Filled 2016-03-26 (×3): qty 150

## 2016-03-26 MED ORDER — METOPROLOL TARTRATE 5 MG/5ML IV SOLN
5.0000 mg | Freq: Four times a day (QID) | INTRAVENOUS | Status: DC
  Administered 2016-03-26 – 2016-03-28 (×8): 5 mg via INTRAVENOUS
  Filled 2016-03-26 (×8): qty 5

## 2016-03-26 MED ORDER — POTASSIUM CHLORIDE 20 MEQ/15ML (10%) PO SOLN
40.0000 meq | Freq: Three times a day (TID) | ORAL | Status: AC
  Administered 2016-03-26 (×2): 40 meq
  Filled 2016-03-26 (×2): qty 30

## 2016-03-26 MED ORDER — FUROSEMIDE 10 MG/ML IJ SOLN
40.0000 mg | Freq: Three times a day (TID) | INTRAMUSCULAR | Status: AC
  Administered 2016-03-26 (×2): 40 mg via INTRAVENOUS
  Filled 2016-03-26 (×2): qty 4

## 2016-03-26 MED ORDER — SODIUM CHLORIDE 0.9 % IV SOLN
Freq: Once | INTRAVENOUS | Status: AC
  Administered 2016-03-26: 05:00:00 via INTRAVENOUS
  Filled 2016-03-26: qty 1000

## 2016-03-26 MED ORDER — POTASSIUM PHOSPHATES 15 MMOLE/5ML IV SOLN
30.0000 mmol | Freq: Once | INTRAVENOUS | Status: AC
  Administered 2016-03-26: 30 mmol via INTRAVENOUS
  Filled 2016-03-26: qty 10

## 2016-03-26 NOTE — Progress Notes (Signed)
eLink Physician-Brief Progress Note Patient Name: Udell Rhue DOB: April 14, 1950 MRN: [id number]   Date of Service  03/26/2016  HPI/Events of Note  Hypokalemia and hypophos  eICU Interventions  Potassium and phos replaced     Intervention Category Intermediate Interventions: Electrolyte abnormality - evaluation and management  DETERDING,ELIZABETH 03/26/2016, 3:42 AM

## 2016-03-26 NOTE — Progress Notes (Signed)
PULMONARY / CRITICAL CARE MEDICINE   Name: Randall Patel MRN: [id number] DOB: [birth year]    ADMISSION DATE:  03/23/2016   REFERRING MD:  EDP - [facility]  CHIEF COMPLAINT:  PEA arrest  Brief:   65 yo AAM with hx of stroke x 2(last left hemorrhagic with rt hand residual weaknes) CAD with past MI, Etoh with 5th bourbon every 2 days, tobacco 1/2 ppd life long, HTN who was admitteD 12/12 to [facility] ed. He walked into sister's bedroom and co sob. Found in br with bloody sputum and unresponsive but breathing. EMS was activated and [facility] but in route had PEA arrest and required Epi x 1 and intubation with #7 ett. Found to be acidotic ph 7.12, pco2 66, po2 47 in ed. Homero FellersFrank pulmonary edema on exam and cxr. Vent changes, diuresis, sedation change made with improved ventilation. He will need CT head to ro ICH prior to hypothermia protocol.  SUBJECTIVE:  No events overnight. On precedex and fentanyl.   VITAL SIGNS: BP 119/75 (BP Location: Right Arm)   Pulse (!) 55   Temp (!) 96.2 F (35.7 C) (Axillary)   Resp 18   Ht 5\' 6"  (1.676 m)   Wt 66.3 kg (146 lb 1.6 oz)   SpO2 100%   BMI 23.58 kg/m   HEMODYNAMICS: CVP:  [6 mmHg] 6 mmHg  VENTILATOR SETTINGS: Vent Mode: PSV;CPAP FiO2 (%):  [40 %-50 %] 40 % Set Rate:  [18 bmp] 18 bmp Vt Set:  [500 mL] 500 mL PEEP:  [5 cmH20] 5 cmH20 Pressure Support:  [5 cmH20] 5 cmH20 Plateau Pressure:  [16 cmH20-18 cmH20] 18 cmH20  INTAKE / OUTPUT: I/O last 3 completed shifts: In: 3125.6 [I.V.:1071.6; NG/GT:1303.9; IV Piggyback:750] Out: 7170 [Urine:7170]  PHYSICAL EXAMINATION: General:  Adult male, on vent, lying in bed, interactive and following commands  Neuro: Agitated, follows commands, moves all extremities, pupils intact, tracks HEENT:  ETT in place, normocephalic. Cardiovascular:  No MRG, Brady, s1/s2 Lungs: on vent, end exp wheezes on wean Abdomen:  Soft, active bowel sounds  Musculoskeletal:  No deformities  Skin:  Warm and  dry, intact   LABS:  BMET  Recent Labs Lab 03/24/16 0300 03/25/16 0325 03/26/16 0258  NA 140 139 136  K 3.2* 3.8 2.9*  CL 112* 109 105  CO2 20* 22 24  BUN 23* 18 6  CREATININE 1.61* 1.03 0.88  GLUCOSE 123* 114* 121*   Electrolytes  Recent Labs Lab 03/24/16 0300 03/25/16 0325 03/25/16 1610 03/25/16 1745 03/26/16 0258  CALCIUM 7.9* 8.3*  --   --  8.8*  MG  --  2.0 0.7* 1.8 1.9  PHOS  --  2.7 QUESTIONABLE RESULTS, RECOMMEND RECOLLECT TO VERIFY 2.2* 1.9*    CBC  Recent Labs Lab 03/24/16 0300 03/25/16 0325 03/26/16 0258  WBC 6.6 9.2 7.6  HGB 10.9* 10.5* 10.2*  HCT 32.8* 32.4* 31.0*  PLT 118* 115* 121*    Coag's  Recent Labs Lab 03/23/16 0807  APTT 20*  INR 0.98    Sepsis Markers  Recent Labs Lab 03/23/16 0807 03/23/16 0950 03/23/16 1400 03/23/16 1700 03/24/16 0300 03/25/16 0325  LATICACIDVEN  --  1.8 1.8 1.7  --   --   PROCALCITON 0.47  --   --   --  3.93 1.67    ABG  Recent Labs Lab 03/24/16 0400 03/25/16 0340 03/26/16 0410  PHART 7.537* 7.327* 7.460*  PCO2ART 20.3* 44.0 35.2  PO2ART 277* 88.7 115*    Liver  Enzymes  Recent Labs Lab 03/23/16 0807 03/23/16 0902 03/24/16 1002  AST 36 27 20  ALT 26 23 16*  ALKPHOS 43 41 29*  BILITOT 0.8 0.7 1.2  ALBUMIN 3.4* 3.4* 2.7*    Cardiac Enzymes  Recent Labs Lab 03/23/16 2230 03/24/16 1002 03/24/16 1515  TROPONINI 0.24* 0.21* 0.21*    Glucose  Recent Labs Lab 03/25/16 1229 03/25/16 1651 03/25/16 2024 03/25/16 2359 03/26/16 0458 03/26/16 0751  GLUCAP 126* 127* 113* 124* 142* 182*    Imaging Dg Chest Port 1 View  Result Date: 03/26/2016 CLINICAL DATA:  Hypoxia EXAM: PORTABLE CHEST 1 VIEW COMPARISON:  March 25, 2016 FINDINGS: Endotracheal tube tip is 6.5 cm above the carina. Central catheter tip is in the superior vena cava. Nasogastric tube tip and side port are below the diaphragm. No pneumothorax. There is atelectatic change in the right base region. Lungs  elsewhere are clear. Heart size and pulmonary vascularity are normal. No adenopathy. There is atherosclerotic calcification in the aorta. No bone lesions. IMPRESSION: Tube and catheter positions as described without pneumothorax. Right base atelectasis. Lungs elsewhere clear. There is aortic atherosclerosis. Electronically Signed   By: Bretta Bang III M.D.   On: 03/26/2016 07:13    STUDIES:  12/12 ct head > Hemorrhagic infarct in right frontal lobe 12/12 MRI Head > Subacute hematoma in right posterior frontal lobe measuring 31 cm  12/12 EEG > Diffuse Slowing   CULTURES: 12/12 bc x 2>> 12/12 sputum>>  ANTIBIOTICS: Vancomycin 12/13 > Zosyn 12/13 >  SIGNIFICANT EVENTS: 12/12 pea arrest  LINES/TUBES: 12/12 et >> 12/12 l ij cvl>>  DISCUSSION: 65 yo AAM with hx of stroke x 2(last left hemorrhagic with rt hand residual weaknes) CAD with past MI, Etoh with 5th bourbon every 2 days, tobacco 1/2 ppd life long, HTN who was admitteD 12/12 to [facility] ed. He walked into sister's bedroom and co sob. Found in br with bloody sputum and unresponsive but breathing. EMS was activated and they transported to [facility] ED but in route had PEA arrest and required Epi x 1 and intubation with #7 ett. Found to be acidotic ph 7.12, pco2 66, po2 47 in ed. [name] pulmonary edema on exam and cxr. Vent changes, diuresis, sedation change made with improved ventilation. He will need CT head to ro ICH prior to hypothermia protocol.  ASSESSMENT / PLAN:  PULMONARY A: Resp failure with pulmonary edema Hemoptysis COPD with tobacco abuse Profound hypoxia and hypercarbia  P:   Vent support  PS wean as tolerated, HR and mental status only real barriers at this point. BD's Continue diureses.  Increase precedex for agitation to 1 while weaning.  CARDIOVASCULAR A:  Hx of MI HTN Heavy ETOH/Tobacco use P:  Cardiology following Cardiac Monitoring  Echo EF of 35% IV lopressor started by cards for HR  control  RENAL A:   Acute renal insuff (Baseline Crt 1.2)  Hyperkalemia(most likely due to acidosis) P:   Trend BMP Lasix 40 meq IV x2 doses Replace electrolytes as needed  KVO IVF  GASTROINTESTINAL A:   GI protection P:   PPI TF per nutrition   HEMATOLOGIC A:   No acute issue P:  Trend CBC  Transfuse per ICU protocol  INFECTIOUS A:   ?Aspiration Event during intubation  Procal 3.93, febrile, no WBC P:   Follow cultures  Trend WBC and Fever curve  Continue Vanc and Zosyn for now Awaiting final cultures.  ENDOCRINE A:   Hyperglycemia P:   SSI  NEUROLOGIC A:   Frontal Lobe ICH  Hx of 2 strokes last 2 years with rt hand weakness H/O ETOH, Seizures  P:   RASS goal-1 Precedex drip Scheduled ativan with goal to wean propofol gtt  Fentanyl drip Thiamine, folic acid Neuro and Neuro Stroke Following   FAMILY  - Updates: No family bedside 12/15.  Would like to extubate but concern is that HR is too high.  Will attempt to control with precedex and beta blocker then attempt extubation later this morning.  - Inter-disciplinary family meet or Palliative Care meeting due by:  day 7  The patient is critically ill with multiple organ systems failure and requires high complexity decision making for assessment and support, frequent evaluation and titration of therapies, application of advanced monitoring technologies and extensive interpretation of multiple databases.   Critical Care Time devoted to patient care services described in this note is  35  Minutes. This time reflects time of care of this signee Dr Koren Bound. This critical care time does not reflect procedure time, or teaching time or supervisory time of PA/NP/Med student/Med Resident etc but could involve care discussion time.  Alyson Reedy, M.D. [facility] Pulmonary/Critical Care Medicine. Pager: [phone]. After hours pager: [phone].

## 2016-03-26 NOTE — Progress Notes (Signed)
Patient Name: Randall Patel Date of Encounter: 03/26/2016  Primary Cardiologist: Dr. [facility] Problem List     Active Problems:   Cardiac arrest [facility])     Subjective   Sedate on vent. Becomes agitated when weaning Precidex. Trying to extube (STach 140)  Inpatient Medications    Scheduled Meds: . chlorhexidine gluconate (MEDLINE KIT)  15 mL Mouth Rinse BID  . famotidine (PEPCID) IV  20 mg Intravenous Q12H  . feeding supplement (PRO-STAT SUGAR FREE 64)  30 mL Per Tube BID  . folic acid  1 mg Intravenous Daily  . insulin aspart  0-9 Units Subcutaneous Q4H  . ipratropium-albuterol  3 mL Nebulization Q6H  . LORazepam  1 mg Intravenous Q6H  . mouth rinse  15 mL Mouth Rinse 10 times per day  . metoprolol  5 mg Intravenous Q6H  . pantoprazole (PROTONIX) IV  40 mg Intravenous QHS  . piperacillin-tazobactam (ZOSYN)  IV  3.375 g Intravenous Q8H  . potassium phosphate IVPB (mmol)  30 mmol Intravenous Once  . sodium chloride flush  10-40 mL Intracatheter Q12H  . thiamine injection  100 mg Intravenous Daily  . vancomycin  750 mg Intravenous Q12H   Continuous Infusions: . sodium chloride 10 mL/hr at 03/23/16 2000  . dexmedetomidine 0.4 mcg/kg/hr (03/26/16 0756)  . feeding supplement (VITAL AF 1.2 CAL) 1,000 mL (03/25/16 2225)  . fentaNYL infusion INTRAVENOUS 75 mcg/hr (03/26/16 0756)  . niCARDipine 3 mg/hr (03/26/16 0817)   PRN Meds: acetaminophen (TYLENOL) oral liquid 160 mg/5 mL, fentaNYL, Influenza vac split quadrivalent PF, ondansetron (ZOFRAN) IV, pneumococcal 23 valent vaccine, sodium chloride flush   Vital Signs    Vitals:   03/26/16 0600 03/26/16 0700 03/26/16 0737 03/26/16 0821  BP: 95/61 107/69 119/75   Pulse: 63 (!) 57 (!) 55   Resp: '18 17 18   ' Temp: 98.4 F (36.9 C) 97.9 F (36.6 C) (!) 96.2 F (35.7 C)   TempSrc:   Axillary   SpO2: 99% 100% 100% 100%  Weight:      Height:        Intake/Output Summary (Last 24 hours) at 03/26/16 0903 Last  data filed at 03/26/16 0400  Gross per 24 hour  Intake           1515.8 ml  Output             6060 ml  Net          -4544.2 ml   Filed Weights   03/24/16 0300 03/25/16 0300 03/26/16 0530  Weight: 154 lb 9.6 oz (70.1 kg) 153 lb 14.4 oz (69.8 kg) 146 lb 1.6 oz (66.3 kg)    Physical Exam    GEN: Sedate thin,ill appearing. Occasionally agitated HEENT: Grossly normal. ETT Neck: Supple, no JVD, carotid bruits, or masses. Cardiac: Tachy RR, no murmurs, rubs, or gallops. No clubbing, cyanosis, edema.  Radials/DP/PT 2+ and equal bilaterally.  Respiratory:  Respirations regular and unlabored, clear to auscultation bilaterally. GI: Soft, nontender, nondistended, BS + x 4. MS: no deformity or atrophy. Skin: warm and dry, no rash. Neuro:  Strength and sensation are intact. Psych: unable  Labs    CBC  Recent Labs  03/25/16 0325 03/26/16 0258  WBC 9.2 7.6  HGB 10.5* 10.2*  HCT 32.4* 31.0*  MCV 85.9 83.6  PLT 115* 217*   Basic Metabolic Panel  Recent Labs  03/25/16 0325  03/25/16 1745 03/26/16 0258  NA 139  --   --  136  K 3.8  --   --  2.9*  CL 109  --   --  105  CO2 22  --   --  24  GLUCOSE 114*  --   --  121*  BUN 18  --   --  6  CREATININE 1.03  --   --  0.88  CALCIUM 8.3*  --   --  8.8*  MG 2.0  < > 1.8 1.9  PHOS 2.7  < > 2.2* 1.9*  < > = values in this interval not displayed. Liver Function Tests  Recent Labs  03/24/16 1002  AST 20  ALT 16*  ALKPHOS 29*  BILITOT 1.2  PROT 5.4*  ALBUMIN 2.7*   No results for input(s): LIPASE, AMYLASE in the last 72 hours. Cardiac Enzymes  Recent Labs  03/23/16 2230 03/24/16 1002 03/24/16 1515  TROPONINI 0.24* 0.21* 0.21*   BNP Invalid input(s): POCBNP D-Dimer No results for input(s): DDIMER in the last 72 hours. Hemoglobin A1C No results for input(s): HGBA1C in the last 72 hours. Fasting Lipid Panel  Recent Labs  03/25/16 0325  TRIG 113   Thyroid Function Tests No results for input(s): TSH, T4TOTAL,  T3FREE, THYROIDAB in the last 72 hours.  Invalid input(s): FREET3  Telemetry    Sinus tach 120-140 (not flutter) when agitated. Now SR 130 - Personally Reviewed  ECG    NO ST elevation - Personally Reviewed  Radiology    Dg Chest Port 1 View  Result Date: 03/26/2016 CLINICAL DATA:  Hypoxia EXAM: PORTABLE CHEST 1 VIEW COMPARISON:  March 25, 2016 FINDINGS: Endotracheal tube tip is 6.5 cm above the carina. Central catheter tip is in the superior vena cava. Nasogastric tube tip and side port are below the diaphragm. No pneumothorax. There is atelectatic change in the right base region. Lungs elsewhere are clear. Heart size and pulmonary vascularity are normal. No adenopathy. There is atherosclerotic calcification in the aorta. No bone lesions. IMPRESSION: Tube and catheter positions as described without pneumothorax. Right base atelectasis. Lungs elsewhere clear. There is aortic atherosclerosis. Electronically Signed   By: Lowella Grip III M.D.   On: 03/26/2016 07:13   Dg Chest Port 1 View  Result Date: 03/25/2016 CLINICAL DATA:  Endotracheal tube EXAM: PORTABLE CHEST 1 VIEW COMPARISON:  03/24/2016 FINDINGS: Endotracheal tube in good position. Left jugular catheter tip in the SVC. NG tube enters the stomach. No pneumothorax. Minimal bibasilar atelectasis unchanged. Resolution of pulmonary edema. IMPRESSION: Endotracheal tube in good position Resolution of pulmonary edema.  Mild bibasilar atelectasis. Electronically Signed   By: Franchot Gallo M.D.   On: 03/25/2016 07:10    Cardiac Studies   Cath 2013  1. NSTEMI 2. Triple vessel CAD with occluded RCA, culprit vessel. 3. Successful PTCA/DES x 1 distal RCA 4. Successful PTCA with balloon angioplasty only of the posterolateral branch.  5. Segmental LV systolic dysfunction  Patient Profile     65 year old with cardiac arrest (PEA) with known CAD DES to dRCA, prior EF 45%, mildly elevated flat troponin, acute respiratory failure,  ETOH heavy use, subacute hematoma of right posterior frontal lobe.  Assessment & Plan    1. Cardiac Arrest - initially reported acute dyspnea but arrested upon EMS arrival. Initially in PEA then Asystole. CPR was performed for over 6 minutes with administration of Epinephrine x1 with ROSC.  - primary cardiac etiology less likely with no acute ST or T-wave changes and flat troponin trend. - ECHO EF 45% with inferoseptal hypokinesis,  PASP 96mHg.  Not a current cardiac catheterization candidate with his right frontal lobe hematoma. No Heparin given hematoma.   2. CAD - cath in 03/2012 showed 30% ostial LM stenosis, 30% pLAD stenosis, 60% mid-LADm 30% pCx, 3rd OM which was small in size with diffuse 90% stenosis involving both branches, and RCA with 50% mid-stenosis and 100% distal occlusion. This was treated with DES x1 to the dRCA with successful PTCA with balloon angioplasty only of the posterolateral branch being perfomed.  - Will resume BB - metop 548mIV Q6 - restart ACE-I, and statin once extubated  3. Nonischemic Cardiomyopathy - EF 40-45% by echocardiogram in 03/2015. Does not appear volume overloaded by physical exam. - restart BB - metoprolol 36m636mV Q6. and ACE-I as hemodynamics allow.  - echo official results pending.   4. Elevated Troponin - cyclic troponin values at 0.04, 0.13, 0.21, 0.24, and 0.21. Likely secondary to demand ischemia in the setting of his cardiac arrest.  5. Respiratory Failure with Pulmonary Edema and Hemoptysis - remains intubated and sedated. On broad-spectrum antibiotics.  - per Critical Care - when awakened for planned extubation, HR increased to 140 sinus tachycardia. When sedate, NSR 60. Ativan - ETOH  6. Subacute Hematoma - MRI of brain this admission shows subacute hematoma in the right posterior frontal lobe measuring up to 31 mm. - Nicardipine started to assist with BP    Signed, MarCandee FurbishD  03/26/2016, 9:03 AM

## 2016-03-26 NOTE — Progress Notes (Signed)
  Echocardiogram 2D Echocardiogram has been performed.  Randall Patel 03/26/2016, 9:17 AM

## 2016-03-26 NOTE — Progress Notes (Signed)
Pharmacy Antibiotic Note  Randall Patel is a 65 y.o. male admitted on 03/23/2016 after being found unresponsive, he had PEA arrest with ROSC. Vancomycin and zosyn started empically for fever. WBC= 6.6, tmax= 100.4, cultures, ngtd, MRSA PCR- neg  -Spoke to Dr. Molli Knock: change to levaquin   Plan: -Levaquin 750mg  IV q24hr -Consider a total of 8 days antibiotics   Height: 5\' 6"  (167.6 cm) Weight: 146 lb 1.6 oz (66.3 kg) IBW/kg (Calculated) : 63.8  Temp (24hrs), Avg:98.4 F (36.9 C), Min:96.2 F (35.7 C), Max:99.9 F (37.7 C)   Recent Labs Lab 03/23/16 0645 03/23/16 0656 03/23/16 0807 03/23/16 0950 03/23/16 1400 03/23/16 1700 03/24/16 0300 03/25/16 0325 03/26/16 0258  WBC 10.6*  --  7.0  --   --   --  6.6 9.2 7.6  CREATININE 1.39* 1.20 1.13  --   --   --  1.61* 1.03 0.88  LATICACIDVEN  --  4.66*  --  1.8 1.8 1.7  --   --   --     Estimated Creatinine Clearance: 77.5 mL/min (by C-G formula based on SCr of 0.88 mg/dL).    Allergies  Allergen Reactions  . Lisinopril Swelling    This allergy is noted from previous electronic record; however, pt. Is unable to confirm this allergy at this time  . Other     Son states he is allergic to an antibiotics  Not sur which one. Also pharmacy has no records of any allergies     Antimicrobials this admission: 12/12 vancomycin > 12/15 12/12 zosyn >12/15 12/15 levaquin  Dose adjustments this admission: NA  Microbiology results: 12/12 blood x2 12/12- urine- neg 12/12 resp- normal flora MRSA PCR- neg  Thank you for allowing pharmacy to be a part of this patient's care.  Harland German, Pharm D 03/26/2016 11:58 AM

## 2016-03-27 ENCOUNTER — Inpatient Hospital Stay (HOSPITAL_COMMUNITY): Payer: Medicare HMO

## 2016-03-27 LAB — BASIC METABOLIC PANEL
Anion gap: 11 (ref 5–15)
BUN: 23 mg/dL — AB (ref 6–20)
CHLORIDE: 105 mmol/L (ref 101–111)
CO2: 24 mmol/L (ref 22–32)
CREATININE: 0.95 mg/dL (ref 0.61–1.24)
Calcium: 9.4 mg/dL (ref 8.9–10.3)
GFR calc Af Amer: >60 mL/min (ref >60)
GFR calc non Af Amer: >60 mL/min (ref >60)
Glucose, Bld: 132 mg/dL — ABNORMAL HIGH (ref 65–99)
Potassium: 4.2 mmol/L (ref 3.5–5.1)
SODIUM: 140 mmol/L (ref 135–145)

## 2016-03-27 LAB — CBC
HCT: 35.9 % — ABNORMAL LOW (ref 39.0–52.0)
HEMOGLOBIN: 11.6 g/dL — AB (ref 13.0–17.0)
MCH: 27.4 pg (ref 26.0–34.0)
MCHC: 32.3 g/dL (ref 30.0–36.0)
MCV: 84.7 fL (ref 78.0–100.0)
PLATELETS: 190 10*3/uL (ref 150–400)
RBC: 4.24 MIL/uL (ref 4.22–5.81)
RDW: 14.2 % (ref 11.5–15.5)
WBC: 8.7 10*3/uL (ref 4.0–10.5)

## 2016-03-27 LAB — URINE DRUGS OF ABUSE SCREEN W ALC, ROUTINE (REF LAB)
AMPHETAMINES, URINE: NEGATIVE ng/mL
BARBITURATE, UR: NEGATIVE ng/mL
BENZODIAZEPINE QUANT UR: NEGATIVE ng/mL
COCAINE (METAB.): NEGATIVE ng/mL
ETHANOL U, QUAN: NEGATIVE %
METHADONE SCREEN, URINE: NEGATIVE ng/mL
Opiate Quant, Ur: NEGATIVE ng/mL
Phencyclidine, Ur: NEGATIVE ng/mL
Propoxyphene, Urine: NEGATIVE ng/mL

## 2016-03-27 LAB — GLUCOSE, CAPILLARY
Glucose-Capillary: 139 mg/dL — ABNORMAL HIGH (ref 65–99)
Glucose-Capillary: 188 mg/dL — ABNORMAL HIGH (ref 65–99)
Glucose-Capillary: 132 mg/dL — ABNORMAL HIGH (ref 65–99)
Glucose-Capillary: 153 mg/dL — ABNORMAL HIGH (ref 65–99)

## 2016-03-27 LAB — POCT I-STAT 3, ART BLOOD GAS (G3+)
Bicarbonate: 25 mmol/L (ref 20.0–28.0)
O2 Saturation: 97 %
Patient temperature: 37.2
TCO2: 26 mmol/L (ref 0–100)
pCO2 arterial: 41.1 mmHg (ref 32.0–48.0)
pH, Arterial: 7.392 (ref 7.350–7.450)
pO2, Arterial: 96 mmHg (ref 83.0–108.0)

## 2016-03-27 LAB — MAGNESIUM: MAGNESIUM: 2.2 mg/dL (ref 1.7–2.4)

## 2016-03-27 LAB — PANEL 799049
CARBOXY THC GC/MS CONF: 84 ng/mL
Cannabinoid GC/MS, Ur: POSITIVE — AB

## 2016-03-27 LAB — PHOSPHORUS: Phosphorus: 1.7 mg/dL — ABNORMAL LOW (ref 2.5–4.6)

## 2016-03-27 MED ORDER — LABETALOL HCL 5 MG/ML IV SOLN
20.0000 mg | Freq: Once | INTRAVENOUS | Status: AC
  Administered 2016-03-27: 20 mg via INTRAVENOUS

## 2016-03-27 MED ORDER — FENTANYL CITRATE (PF) 100 MCG/2ML IJ SOLN
25.0000 ug | INTRAMUSCULAR | Status: DC | PRN
  Administered 2016-03-29 – 2016-03-30 (×2): 25 ug via INTRAVENOUS
  Filled 2016-03-27 (×2): qty 2

## 2016-03-27 MED ORDER — LABETALOL HCL 5 MG/ML IV SOLN
INTRAVENOUS | Status: AC
  Administered 2016-03-27: 14:00:00
  Filled 2016-03-27: qty 4

## 2016-03-27 MED ORDER — LORAZEPAM 2 MG/ML IJ SOLN
1.0000 mg | INTRAMUSCULAR | Status: DC | PRN
  Administered 2016-03-27 – 2016-03-28 (×3): 1 mg via INTRAVENOUS
  Filled 2016-03-27 (×3): qty 1

## 2016-03-27 MED ORDER — ORAL CARE MOUTH RINSE
15.0000 mL | Freq: Two times a day (BID) | OROMUCOSAL | Status: DC
  Administered 2016-03-27 – 2016-03-31 (×6): 15 mL via OROMUCOSAL

## 2016-03-27 MED ORDER — SODIUM PHOSPHATES 45 MMOLE/15ML IV SOLN
30.0000 mmol | Freq: Once | INTRAVENOUS | Status: AC
  Administered 2016-03-27: 30 mmol via INTRAVENOUS
  Filled 2016-03-27: qty 10

## 2016-03-27 NOTE — Progress Notes (Signed)
Wasted 290 mL of Fentanyl with Deretha Emory, RN in sink.

## 2016-03-27 NOTE — Progress Notes (Signed)
Patient Name: Randall Patel Date of Encounter: 03/27/2016  Primary Cardiologist: Dr. Shon Millet Problem List     Active Problems:   Cardiac arrest [facility])   Acute pulmonary edema ([place])   Respiratory failure ([place])   Sinus tachycardia   Patient Profile     65 year old with cardiac arrest (PEA) with known CAD DES to dRCA, prior EF 45%, mildly elevated flat troponin, acute respiratory failure, ETOH heavy use, subacute hematoma of right posterior frontal lobe. 12/17 Echo EF 45-50%    Subjective   Extubated this am , not very verbal--very slow but still on precedex   Inpatient Medications    Scheduled Meds: . chlorhexidine gluconate (MEDLINE KIT)  15 mL Mouth Rinse BID  . famotidine (PEPCID) IV  20 mg Intravenous Q12H  . feeding supplement (PRO-STAT SUGAR FREE 64)  30 mL Per Tube BID  . folic acid  1 mg Intravenous Daily  . insulin aspart  0-9 Units Subcutaneous Q4H  . ipratropium-albuterol  3 mL Nebulization Q6H  . levofloxacin (LEVAQUIN) IV  750 mg Intravenous Q24H  . LORazepam  1 mg Intravenous Q6H  . mouth rinse  15 mL Mouth Rinse 10 times per day  . metoprolol  5 mg Intravenous Q6H  . pantoprazole (PROTONIX) IV  40 mg Intravenous QHS  . sodium chloride flush  10-40 mL Intracatheter Q12H  . sodium phosphate  Dextrose 5% IVPB  30 mmol Intravenous Once  . thiamine injection  100 mg Intravenous Daily   Continuous Infusions: . sodium chloride 10 mL/hr at 03/26/16 1900  . dexmedetomidine 0.6 mcg/kg/hr (03/27/16 0800)  . feeding supplement (VITAL AF 1.2 CAL) 1,000 mL (03/26/16 1900)  . fentaNYL infusion INTRAVENOUS 75 mcg/hr (03/27/16 0800)  . niCARDipine 5 mg/hr (03/27/16 0822)   PRN Meds: acetaminophen (TYLENOL) oral liquid 160 mg/5 mL, fentaNYL, Influenza vac split quadrivalent PF, ondansetron (ZOFRAN) IV, pneumococcal 23 valent vaccine, sodium chloride flush   Vital Signs    Vitals:   03/27/16 0447 03/27/16 0500 03/27/16 0600 03/27/16 0800  BP:  (!)  136/95 (!) 150/97 (!) 179/109  Pulse:  82 62 67  Resp:  '18 18 18  ' Temp:  99.7 F (37.6 C) 100 F (37.8 C) 99.9 F (37.7 C)  TempSrc:      SpO2:  99% 100% 100%  Weight: 144 lb 6.4 oz (65.5 kg)     Height:        Intake/Output Summary (Last 24 hours) at 03/27/16 0840 Last data filed at 03/27/16 0800  Gross per 24 hour  Intake          2645.04 ml  Output             4875 ml  Net         -2229.96 ml   Filed Weights   03/25/16 0300 03/26/16 0530 03/27/16 0447  Weight: 153 lb 14.4 oz (69.8 kg) 146 lb 1.6 oz (66.3 kg) 144 lb 6.4 oz (65.5 kg)    Physical Exam    GEN: Sedate thin,ill appearing.somnolent   HEENT: Grossly normal.  Neck: Supple, no JVD, carotid bruits, or masses. Cardiac: Tachy RR, no murmurs, rubs, or gallops. No clubbing, cyanosis, edema.  Radials/DP/PT 2+ and equal bilaterally.  Respiratory:  Respirations regular and unlabored, clear to auscultation bilaterally. GI: Soft, nontender, nondistended, BS + x 4. MS: no deformity or atrophy. Skin: warm and dry, no rash. Neuro:  Strength and sensation are intact. Psych: not terribly responsive   Labs  CBC  Recent Labs  03/26/16 0258 03/27/16 0430  WBC 7.6 8.7  HGB 10.2* 11.6*  HCT 31.0* 35.9*  MCV 83.6 84.7  PLT 121* 597   Basic Metabolic Panel  Recent Labs  03/26/16 0258 03/26/16 1630 03/27/16 0430  NA 136  --  140  K 2.9*  --  4.2  CL 105  --  105  CO2 24  --  24  GLUCOSE 121*  --  132*  BUN 6  --  23*  CREATININE 0.88  --  0.95  CALCIUM 8.8*  --  9.4  MG 1.9 1.8 2.2  PHOS 1.9* 2.2* 1.7*   Liver Function Tests  Recent Labs  03/24/16 1002  AST 20  ALT 16*  ALKPHOS 29*  BILITOT 1.2  PROT 5.4*  ALBUMIN 2.7*   No results for input(s): LIPASE, AMYLASE in the last 72 hours. Cardiac Enzymes  Recent Labs  03/24/16 1002 03/24/16 1515  TROPONINI 0.21* 0.21*   BNP Invalid input(s): POCBNP D-Dimer No results for input(s): DDIMER in the last 72 hours. Hemoglobin A1C No results for  input(s): HGBA1C in the last 72 hours. Fasting Lipid Panel  Recent Labs  03/25/16 0325  TRIG 113   Thyroid Function Tests No results for input(s): TSH, T4TOTAL, T3FREE, THYROIDAB in the last 72 hours.  Invalid input(s): FREET3  Telemetry    Telemetry Personally reviewed  Sinus rates [id number] some VTNS   ECG    NO ST elevation - Personally Reviewed  Radiology    Dg Chest Port 1 View  Result Date: 03/27/2016 CLINICAL DATA:  ET tube EXAM: PORTABLE CHEST 1 VIEW COMPARISON:  03/26/2016 FINDINGS: Support devices including endotracheal tube are stable. Mild cardiomegaly. Bibasilar atelectasis. No overt edema or effusions. IMPRESSION: Mild cardiomegaly and bibasilar atelectasis. Electronically Signed   By: Rolm Baptise M.D.   On: 03/27/2016 08:00   Dg Chest Port 1 View  Result Date: 03/26/2016 CLINICAL DATA:  Hypoxia EXAM: PORTABLE CHEST 1 VIEW COMPARISON:  March 25, 2016 FINDINGS: Endotracheal tube tip is 6.5 cm above the carina. Central catheter tip is in the superior vena cava. Nasogastric tube tip and side port are below the diaphragm. No pneumothorax. There is atelectatic change in the right base region. Lungs elsewhere are clear. Heart size and pulmonary vascularity are normal. No adenopathy. There is atherosclerotic calcification in the aorta. No bone lesions. IMPRESSION: Tube and catheter positions as described without pneumothorax. Right base atelectasis. Lungs elsewhere clear. There is aortic atherosclerosis. Electronically Signed   By: Lowella Grip III M.D.   On: 03/26/2016 07:13    Cardiac Studies   Cath 2013  1. NSTEMI 2. Triple vessel CAD with occluded RCA, culprit vessel. 3. Successful PTCA/DES x 1 distal RCA 4. Successful PTCA with balloon angioplasty only of the posterolateral branch.  5. Segmental LV systolic dysfunction   Assessment & Plan    1. Cardiac Arrest - initially reported acute dyspnea but arrested upon EMS arrival. Initially in PEA then  Asystole.  presemably this was a primary resp event   2. CAD - cath in 03/2012 showed 30% ostial LM stenosis, 30% pLAD stenosis, 60% mid-LADm 30% pCx, 3rd OM which was small in size with diffuse 90% stenosis involving both branches, and RCA with 50% mid-stenosis and 100% distal occlusion. This was treated with DES x1 to the dRCA with successful PTCA with balloon angioplasty only of the posterolateral branch being perfomed.   Cath ( intervention) precluded 2/2 hematoma - Will resume BB -  metop 69m IV Q6 - restart ACE-I, and statin as he awakens   3. Nonischemic Cardiomyopathy - EF 40-45% by echocardiogram in 03/2015.   4. Elevated Troponin - cyclic troponin values at 0.04, 0.13, 0.21, 0.24, and 0.21. Likely secondary to demand ischemia in the setting of his cardiac arrest.  5. Respiratory Failure with Pulmonary Edema and Hemoptysis  Per CCM    6. Subacute Hematoma - MRI of brain this admission shows subacute hematoma in the right posterior frontal lobe measuring up to 31 mm.Per Neuro notes not felt to be related to acute trauma or presentation with encephalopathy - Nicardipine started to assist with BP    Signed, SVirl Axe MD  03/27/2016, 8:40 AM

## 2016-03-27 NOTE — Procedures (Signed)
Extubation Procedure Note  Patient Details:   Name: Randall Patel DOB: 1951/03/15 MRN: [id number]   Airway Documentation:  Airway 7 mm (Active)  Secured at (cm) 23 cm 03/27/2016  8:12 AM  Measured From Lips 03/27/2016  8:12 AM  Secured Location Center 03/27/2016  8:12 AM  Secured By [employer] 03/27/2016  8:12 AM  Tube Holder Repositioned Yes 03/27/2016  8:12 AM  Cuff Pressure (cm H2O) 27 cm H2O 03/27/2016  8:12 AM  Site Condition Dry 03/27/2016  8:12 AM    Evaluation  O2 sats: stable throughout Complications: No apparent complications Patient did tolerate procedure well. Bilateral Breath Sounds: Diminished   Yes  [name] 03/27/2016, 8:51 AM

## 2016-03-27 NOTE — Progress Notes (Signed)
   eLink Physician Progress Note and Electrolyte Replacement  Patient Name: Randall Patel DOB: 1951/04/08 MRN: [id number]  Date of Service  03/27/2016   HPI/Events of Note    Recent Labs Lab 03/23/16 0807 03/24/16 0300 03/25/16 0325 03/25/16 1610 03/25/16 1745 03/26/16 0258 03/26/16 1630 03/27/16 0430  NA 139 140 139  --   --  136  --  140  K 5.1 3.2* 3.8  --   --  2.9*  --  4.2  CL 110 112* 109  --   --  105  --  105  CO2 21* 20* 22  --   --  24  --  24  GLUCOSE 126* 123* 114*  --   --  121*  --  132*  BUN 16 23* 18  --   --  6  --  23*  CREATININE 1.13 1.61* 1.03  --   --  0.88  --  0.95  CALCIUM 8.4* 7.9* 8.3*  --   --  8.8*  --  9.4  MG 1.8  --  2.0 0.7* 1.8 1.9 1.8 2.2  PHOS 3.9  --  2.7 QUESTIONABLE RESULTS, RECOMMEND RECOLLECT TO VERIFY 2.2* 1.9* 2.2* 1.7*    Estimated Creatinine Clearance: 71.8 mL/min (by C-G formula based on SCr of 0.95 mg/dL).  Intake/Output      12/15 0701 - 12/16 0700   I.V. (mL/kg) 1594 (24.3)   NG/GT 1680   IV Piggyback 860   Total Intake(mL/kg) 4134 (63.1)   Urine (mL/kg/hr) 4525 (2.9)   Stool 0 (0)   Total Output 4525   Net -391       Stool Occurrence 1 x    - I/O DETAILED x 24h    Total I/O In: 1150.7 [I.V.:400.7; NG/GT:700; IV Piggyback:50] Out: 1075 [Urine:1075] - I/O THIS SHIFT    ASSESSMENT Severe low phos  eICURN Interventions  Na phos   ASSESSMENT: MAJOR ELECTROLYTE      Dr. Kalman Shan, M.D., [facility].C.P Pulmonary and Critical Care Medicine Staff Physician [facility] [place] [facility] Pager: [phone], If no answer or between  15:00h - 7:00h: call [phone]  03/27/2016 5:51 AM

## 2016-03-27 NOTE — Progress Notes (Signed)
Pt extubated per Dr. Craige Cotta. Pt has a strong cough, leak test good, and Pt was able to follow commands. Pt was able to talk after extubation. RT will continue to monitor

## 2016-03-27 NOTE — Progress Notes (Signed)
PULMONARY / CRITICAL CARE MEDICINE   Name: Randall Patel MRN: [id number] DOB: [birth year]    ADMISSION DATE:  03/23/2016   REFERRING MD:  EDP - Randall Patel  CHIEF COMPLAINT:  PEA arrest  Brief:   65 yo AAM with hx of stroke x 2(last left hemorrhagic with rt hand residual weaknes) CAD with past MI, Etoh with 5th bourbon every 2 days, tobacco 1/2 ppd life long, HTN who was admitteD 12/12 to [facility]. He walked into sister's bedroom and co sob. Found in br with bloody sputum and unresponsive but breathing. EMS was activated and they transported to [facility] but in route had PEA arrest and required Epi x 1 and intubation with #7 ett. Found to be acidotic ph 7.12, pco2 66, po2 47 in Patel. Randall Patel pulmonary edema on exam and cxr. Vent changes, diuresis, sedation change made with improved ventilation. He will need CT head to ro ICH prior to hypothermia protocol.  SUBJECTIVE:  Tolerating SBT.  VITAL SIGNS: BP (!) 179/109   Pulse 67   Temp 99.9 F (37.7 C)   Resp 18   Ht 5\' 6"  (1.676 m)   Wt 144 lb 6.4 oz (65.5 kg)   SpO2 100%   BMI 23.31 kg/m   VENTILATOR SETTINGS: Vent Mode: PSV;CPAP FiO2 (%):  [30 %-40 %] 30 % Set Rate:  [18 bmp] 18 bmp Vt Set:  [500 mL] 500 mL PEEP:  [5 cmH20] 5 cmH20 Pressure Support:  [5 cmH20-8 cmH20] 5 cmH20 Plateau Pressure:  [16 cmH20-21 cmH20] 21 cmH20  INTAKE / OUTPUT: I/O last 3 completed shifts: In: 5135.2 [I.V.:2088.2; NG/GT:1987.1; IV Piggyback:1060] Out: 7135 [Urine:7135]  PHYSICAL EXAMINATION: General:  Adult male, on vent, lying in bed, interactive and following commands  Neuro: Agitated, follows commands, moves all extremities, pupils intact, tracks HEENT:  ETT in place, normocephalic. Cardiovascular:  No MRG, Huston Foley, s1/s2 Lungs: on vent, end exp wheezes on wean Abdomen:  Soft, active bowel sounds  Musculoskeletal:  No deformities  Skin:  Warm and dry, intact   LABS:  BMET  Recent Labs Lab 03/25/16 0325 03/26/16 0258 03/27/16 0430  NA 139  136 140  K 3.8 2.9* 4.2  CL 109 105 105  CO2 22 24 24   BUN 18 6 23*  CREATININE 1.03 0.88 0.95  GLUCOSE 114* 121* 132*   Electrolytes  Recent Labs Lab 03/25/16 0325  03/26/16 0258 03/26/16 1630 03/27/16 0430  CALCIUM 8.3*  --  8.8*  --  9.4  MG 2.0  < > 1.9 1.8 2.2  PHOS 2.7  < > 1.9* 2.2* 1.7*  < > = values in this interval not displayed.  CBC  Recent Labs Lab 03/25/16 0325 03/26/16 0258 03/27/16 0430  WBC 9.2 7.6 8.7  HGB 10.5* 10.2* 11.6*  HCT 32.4* 31.0* 35.9*  PLT 115* 121* 190    Coag's  Recent Labs Lab 03/23/16 0807  APTT 20*  INR 0.98    Sepsis Markers  Recent Labs Lab 03/23/16 0807 03/23/16 0950 03/23/16 1400 03/23/16 1700 03/24/16 0300 03/25/16 0325  LATICACIDVEN  --  1.8 1.8 1.7  --   --   PROCALCITON 0.47  --   --   --  3.93 1.67    ABG  Recent Labs Lab 03/25/16 0340 03/26/16 0410 03/27/16 0430  PHART 7.327* 7.460* 7.392  PCO2ART 44.0 35.2 41.1  PO2ART 88.7 115* 96.0    Liver Enzymes  Recent Labs Lab 03/23/16 0807 03/23/16 0902 03/24/16 1002  AST 36 27 20  ALT 26 23 16*  ALKPHOS 43 41 29*  BILITOT 0.8 0.7 1.2  ALBUMIN 3.4* 3.4* 2.7*    Cardiac Enzymes  Recent Labs Lab 03/23/16 2230 03/24/16 1002 03/24/16 1515  TROPONINI 0.24* 0.21* 0.21*    Glucose  Recent Labs Lab 03/26/16 0751 03/26/16 1158 03/26/16 1555 03/26/16 2025 03/26/16 2349 03/27/16 0435  GLUCAP 182* 177* 138* 147* 156* 139*    Imaging Dg Chest Port 1 View  Result Date: 03/27/2016 CLINICAL DATA:  ET tube EXAM: PORTABLE CHEST 1 VIEW COMPARISON:  03/26/2016 FINDINGS: Support devices including endotracheal tube are stable. Mild cardiomegaly. Bibasilar atelectasis. No overt edema or effusions. IMPRESSION: Mild cardiomegaly and bibasilar atelectasis. Electronically Signed   By: Charlett NoseKevin  Dover M.D.   On: 03/27/2016 08:00    STUDIES:  12/12 ct head > Hemorrhagic infarct in right frontal lobe 12/12 MRI Head > Subacute hematoma in right  posterior frontal lobe measuring 31 cm  12/12 EEG > Diffuse Slowing   CULTURES: 12/12 bc x 2>> 12/12 sputum>>  ANTIBIOTICS: Vancomycin 12/13 > 12/16 Zosyn 12/13 >12/14 Levaquin 12/15 >>  SIGNIFICANT EVENTS: 12/12 pea arrest  LINES/TUBES: 12/12 et >> 12/16 12/12 l ij cvl>>  DISCUSSION: 65 yo AAM with hx of stroke x 2(last left hemorrhagic with rt hand residual weaknes) CAD with past MI, Etoh with 5th bourbon every 2 days, tobacco 1/2 ppd life long, HTN who [facility] He walked into sister's bedroom and co sob. Found in br with bloody sputum and unresponsive but breathing. EMS was activated [facility] but in route had PEA arrest and required Epi x 1 and intubation with #7 ett. Found to be acidotic ph 7.12, pco2 66, po2 47 in Patel. Randall FellersFrank pulmonary edema on exam and cxr. Vent changes, diuresis, sedation change made with improved ventilation. He will need CT head to ro ICH prior to hypothermia protocol.  ASSESSMENT / PLAN:  PULMONARY A: Resp failure with pulmonary edema Hemoptysis COPD with tobacco abuse Profound hypoxia and hypercarbia  P:   Extubated 12/16 BDs  CARDIOVASCULAR A:  Hx of MI HTN Heavy ETOH/Tobacco use P:  Cardiology following Cardiac Monitoring  Echo EF of 35% IV lopressor started by cards for HR control  RENAL A:   Acute renal insuff (Baseline Crt 1.2)  Hyperkalemia(most likely due to acidosis) P:   Trend BMP  GASTROINTESTINAL A:   Dysphagia P:   PPI Speech to assess swallowing after extubation  HEMATOLOGIC A:   No acute issue P:  Trend CBC  Transfuse per ICU protocol  INFECTIOUS A:   ?Aspiration Event during intubation  P:   Day 4 of Abx  ENDOCRINE A:   Hyperglycemia P:   SSI  NEUROLOGIC A:   Frontal Lobe ICH >> neuro s/o Hx of 2 strokes last 2 years with rt hand weakness H/O ETOH, Seizures  P:   Wean off Precedex drip Prn ativan, fenatnyl Thiamine, folic acid  CC time 32  minutes.  Coralyn HellingVineet Korrine Sicard, MD [facility] Pulmonary/Critical Care 03/27/2016, 8:49 AM Pager:  [phone] After 3pm call: [phone]

## 2016-03-27 NOTE — Progress Notes (Addendum)
Pt. BP remaining in the 180s to 200s systolic and HR in the 150s at about 1400.  Titrating up on Cardene and restarted Precedex for agitation. Gave scheduled metoprolol.  No changes in patient vitals, paged cardiology and gave 20 mg of Labetalol per MD order.  At 1600 patients BP is now 99/69 and HR is 68. I have begun titrating down the Cardene and will continue to monitor very closely.

## 2016-03-28 ENCOUNTER — Encounter (HOSPITAL_COMMUNITY): Payer: Self-pay

## 2016-03-28 LAB — CULTURE, BLOOD (ROUTINE X 2)
Culture: NO GROWTH
Culture: NO GROWTH

## 2016-03-28 LAB — MAGNESIUM: MAGNESIUM: 1.9 mg/dL (ref 1.7–2.4)

## 2016-03-28 LAB — BASIC METABOLIC PANEL
ANION GAP: 8 (ref 5–15)
BUN: 14 mg/dL (ref 6–20)
CALCIUM: 9 mg/dL (ref 8.9–10.3)
CO2: 24 mmol/L (ref 22–32)
Chloride: 110 mmol/L (ref 101–111)
Creatinine, Ser: 0.76 mg/dL (ref 0.61–1.24)
GFR calc Af Amer: >60 mL/min (ref >60)
GFR calc non Af Amer: >60 mL/min (ref >60)
GLUCOSE: 126 mg/dL — AB (ref 65–99)
Potassium: 3.6 mmol/L (ref 3.5–5.1)
Sodium: 142 mmol/L (ref 135–145)

## 2016-03-28 LAB — CBC
HCT: 32.7 % — ABNORMAL LOW (ref 39.0–52.0)
HEMOGLOBIN: 10.5 g/dL — AB (ref 13.0–17.0)
MCH: 27.2 pg (ref 26.0–34.0)
MCHC: 32.1 g/dL (ref 30.0–36.0)
MCV: 84.7 fL (ref 78.0–100.0)
Platelets: 163 10*3/uL (ref 150–400)
RBC: 3.86 MIL/uL — ABNORMAL LOW (ref 4.22–5.81)
RDW: 14.3 % (ref 11.5–15.5)
WBC: 6.5 10*3/uL (ref 4.0–10.5)

## 2016-03-28 LAB — GLUCOSE, CAPILLARY
GLUCOSE-CAPILLARY: 110 mg/dL — AB (ref 65–99)
GLUCOSE-CAPILLARY: 118 mg/dL — AB (ref 65–99)
GLUCOSE-CAPILLARY: 125 mg/dL — AB (ref 65–99)
GLUCOSE-CAPILLARY: 132 mg/dL — AB (ref 65–99)
Glucose-Capillary: 111 mg/dL — ABNORMAL HIGH (ref 65–99)
Glucose-Capillary: 136 mg/dL — ABNORMAL HIGH (ref 65–99)

## 2016-03-28 LAB — PHOSPHORUS: Phosphorus: 3.1 mg/dL (ref 2.5–4.6)

## 2016-03-28 MED ORDER — METOPROLOL TARTRATE 100 MG PO TABS
100.0000 mg | ORAL_TABLET | Freq: Two times a day (BID) | ORAL | Status: DC
  Administered 2016-03-28 – 2016-03-31 (×6): 100 mg via ORAL
  Filled 2016-03-28 (×2): qty 2
  Filled 2016-03-28 (×2): qty 1
  Filled 2016-03-28 (×2): qty 2

## 2016-03-28 MED ORDER — IPRATROPIUM-ALBUTEROL 0.5-2.5 (3) MG/3ML IN SOLN: 3.0000 mL | Freq: Two times a day (BID) | RESPIRATORY_TRACT | Status: DC

## 2016-03-28 MED ORDER — AMLODIPINE BESYLATE 10 MG PO TABS
10.0000 mg | ORAL_TABLET | Freq: Every day | ORAL | Status: DC
  Administered 2016-03-29 – 2016-03-31 (×3): 10 mg via ORAL
  Filled 2016-03-28 (×3): qty 1

## 2016-03-28 MED ORDER — IPRATROPIUM-ALBUTEROL 0.5-2.5 (3) MG/3ML IN SOLN: 3.0000 mL | Freq: Four times a day (QID) | RESPIRATORY_TRACT | Status: DC | PRN

## 2016-03-28 MED ORDER — AMLODIPINE BESYLATE 5 MG PO TABS
5.0000 mg | ORAL_TABLET | Freq: Once | ORAL | Status: AC
  Administered 2016-03-28: 5 mg via ORAL
  Filled 2016-03-28: qty 1

## 2016-03-28 MED ORDER — AMLODIPINE BESYLATE 5 MG PO TABS
5.0000 mg | ORAL_TABLET | Freq: Every day | ORAL | Status: DC
  Administered 2016-03-28: 5 mg via ORAL

## 2016-03-28 MED ORDER — METOPROLOL TARTRATE 50 MG PO TABS
50.0000 mg | ORAL_TABLET | Freq: Two times a day (BID) | ORAL | Status: DC
  Administered 2016-03-28: 50 mg via ORAL

## 2016-03-28 MED ORDER — NICARDIPINE HCL IN NACL 40-0.83 MG/200ML-% IV SOLN
3.0000 mg/h | INTRAVENOUS | Status: DC
  Administered 2016-03-28: 10 mg/h via INTRAVENOUS
  Administered 2016-03-29 (×2): 12 mg/h via INTRAVENOUS
  Filled 2016-03-28 (×5): qty 200

## 2016-03-28 MED ORDER — IPRATROPIUM-ALBUTEROL 0.5-2.5 (3) MG/3ML IN SOLN
3.0000 mL | Freq: Three times a day (TID) | RESPIRATORY_TRACT | Status: DC
  Administered 2016-03-28: 3 mL via RESPIRATORY_TRACT
  Filled 2016-03-28: qty 3

## 2016-03-28 NOTE — Evaluation (Signed)
Clinical/Bedside Swallow Evaluation Patient Details  Name: Randall Patel MRN: [id number] Date of Birth: 29-Apr-1950  Today's Date: 03/28/2016 Time: SLP Start Time (ACUTE ONLY): 1017 SLP Stop Time (ACUTE ONLY): 1036 SLP Time Calculation (min) (ACUTE ONLY): 19 min  Past Medical History:  Past Medical History:  Diagnosis Date  . CAD (coronary artery disease)    a. prior MI in 2006 b. 03/2012: 30% ostial LM stenosis, 30% pLAD stenosis, 60% mid-LADm 30% pCx, 3rd OM which was small in size with diffuse 90% stenosis involving both branches, and RCA with 50% mid-stenosis and 100% distal occlusion. POBA to PL branch and DES placement to dRCA.  [name]. History of stroke   . Ischemic cardiomyopathy    a. EF 40-45% by echo in 03/2015   Past Surgical History: History reviewed. No pertinent surgical history. HPI:  65 yo male smoker with dyspnea, bloody sputum, and syncope with PEA cardiac arrest. Found to have ICH on CT head, right posterior frontal lobe. He was intuabted 12/12-12/16. PMHx of CVA with Rt hand weakness, CAD, ETOH, HTN. Pt was seen by SLP for dysphagia following stroke in12/2016. He initially was started on Dys 2 diet and nectar thick liquids. He went through repeat MBS while on inpatient rehab and was upgraded to Dys 2 diet and thin liquids prior to discharge.   Assessment / Plan / Recommendation Clinical Impression  Pt has left-sided facial weakness that is at least somewhat due to baseline impairment per chart review. He has left-sided anterior loss without sensation and mild left buccal pocketing. SLP provided Min cues and thin liquid washes to facilitate clearance. Pt had some intermittent grunting throughout trials but no immediate coughing following challenging  with large, consecutive straw sips; however, several minutes after SLP left the room, intermittent coughing was heard. Per RN, he has been coughing occasionally to bring up secretions. Recommend to initiate Dys 2 diet and thin  liquids with full supervision when fully alert. Discussed with RN to hold POs if additional coughing is noted with PO intake.    Aspiration Risk  Mild aspiration risk;Moderate aspiration risk    Diet Recommendation Dysphagia 2 (Fine chop);Thin liquid   Liquid Administration via: Cup;Straw Medication Administration: Whole meds with puree Supervision: Staff to assist with self feeding;Full supervision/cueing for compensatory strategies Compensations: Minimize environmental distractions;Slow rate;Small sips/bites;Lingual sweep for clearance of pocketing;Monitor for anterior loss;Follow solids with liquid Postural Changes: Seated upright at 90 degrees;Remain upright for at least 30 minutes after po intake    Other  Recommendations Oral Care Recommendations: Oral care BID Other Recommendations: Have oral suction available   Follow up Recommendations  (tba)      Frequency and Duration min 2x/week  2 weeks       Prognosis Prognosis for Safe Diet Advancement: Fair Barriers to Reach Goals: Other (Comment) (PLOF)      Swallow Study   General HPI: 65 yo male smoker with dyspnea, bloody sputum, and syncope with PEA cardiac arrest. Found to have ICH on CT head, right posterior frontal lobe. He was intuabted 12/12-12/16. PMHx of CVA with Rt hand weakness, CAD, ETOH, HTN. Pt was seen by SLP for dysphagia following stroke in12/2016. He initially was started on Dys 2 diet and nectar thick liquids. He went through repeat MBS while on inpatient rehab and was upgraded to Dys 2 diet and thin liquids prior to discharge. Type of Study: Bedside Swallow Evaluation Previous Swallow Assessment: see HPI Diet Prior to this Study: NPO Temperature Spikes Noted: Yes (100.4) Respiratory  Status: Nasal cannula History of Recent Intubation: Yes Length of Intubations (days): 4 days Date extubated: 03/27/16 Behavior/Cognition: Cooperative;Requires cueing;Lethargic/Drowsy Oral Cavity Assessment: Within Functional  Limits Oral Care Completed by SLP: No Oral Cavity - Dentition: Missing dentition Self-Feeding Abilities: Total assist Patient Positioning: Upright in bed Baseline Vocal Quality: Normal (low volume, but can increase with Min cues) Volitional Cough: Strong Volitional Swallow: Able to elicit    Oral/Motor/Sensory Function Overall Oral Motor/Sensory Function: Moderate impairment (at least some baseline impairment) Facial ROM: Reduced left;Suspected CN VII (facial) dysfunction Facial Symmetry: Abnormal symmetry left;Suspected CN VII (facial) dysfunction Facial Strength: Reduced left;Suspected CN VII (facial) dysfunction Facial Sensation: Reduced left;Suspected CN V (Trigeminal) dysfunction Lingual ROM: Within Functional Limits Lingual Symmetry: Within Functional Limits Lingual Strength: Within Functional Limits Mandible: Within Functional Limits   Ice Chips Ice chips: Within functional limits Presentation: Spoon   Thin Liquid Thin Liquid: Impaired Presentation: Spoon;Straw Oral Phase Impairments: Reduced labial seal Oral Phase Functional Implications: Left anterior spillage Pharyngeal  Phase Impairments: Cough - Delayed    Nectar Thick Nectar Thick Liquid: Not tested   Honey Thick Honey Thick Liquid: Not tested   Puree Puree: Impaired Presentation: Spoon Oral Phase Impairments: Reduced labial seal Oral Phase Functional Implications: Left anterior spillage Pharyngeal Phase Impairments: Cough - Delayed   Solid   GO   Solid: Impaired Oral Phase Impairments: Reduced labial seal;Impaired mastication Oral Phase Functional Implications: Left anterior spillage;Left lateral sulci pocketing Pharyngeal Phase Impairments: Cough - Delayed        [name] 03/28/2016,10:59 AM  [name], M.A. CCC-SLP [phone]

## 2016-03-28 NOTE — Progress Notes (Signed)
PULMONARY / CRITICAL CARE MEDICINE  ADMISSION DATE:  03/23/2016 REFERRING MD:  EDP - Horton  CHIEF COMPLAINT:  PEA arrest  HPI: 65 yo male smoker with dyspnea, bloody sputum, and syncope with PEA cardiac arrest.  Found to have ICH on CT head.    PMHx of CVA with Rt hand weakness, CAD, ETOH, HTN.  SUBJECTIVE:  Wants something to eat.  VITAL SIGNS: BP (!) 160/103   Pulse 86   Temp 99.5 F (37.5 C)   Resp (!) 24   Ht 5\' 6"  (1.676 m)   Wt 143 lb 1.6 oz (64.9 kg)   SpO2 98%   BMI 23.10 kg/m   INTAKE / OUTPUT: I/O last 3 completed shifts: In: 5133 [I.V.:3503; NG/GT:920; IV Piggyback:710] Out: 4075 [Urine:4075]  PHYSICAL EXAMINATION: General: alert  Neuro: follows commands HEENT: no stridor Cardiovascular: regular Lungs: no wheeze Abdomen:  Soft, non tender Musculoskeletal: no edema Skin: no rashes   LABS:  BMET  Recent Labs Lab 03/26/16 0258 03/27/16 0430 03/28/16 0340  NA 136 140 142  K 2.9* 4.2 3.6  CL 105 105 110  CO2 24 24 24   BUN 6 23* 14  CREATININE 0.88 0.95 0.76  GLUCOSE 121* 132* 126*   Electrolytes  Recent Labs Lab 03/26/16 0258 03/26/16 1630 03/27/16 0430 03/28/16 0340  CALCIUM 8.8*  --  9.4 9.0  MG 1.9 1.8 2.2 1.9  PHOS 1.9* 2.2* 1.7* 3.1    CBC  Recent Labs Lab 03/26/16 0258 03/27/16 0430 03/28/16 0340  WBC 7.6 8.7 6.5  HGB 10.2* 11.6* 10.5*  HCT 31.0* 35.9* 32.7*  PLT 121* 190 163    Coag's  Recent Labs Lab 03/23/16 0807  APTT 20*  INR 0.98    Sepsis Markers  Recent Labs Lab 03/23/16 0807 03/23/16 0950 03/23/16 1400 03/23/16 1700 03/24/16 0300 03/25/16 0325  LATICACIDVEN  --  1.8 1.8 1.7  --   --   PROCALCITON 0.47  --   --   --  3.93 1.67    ABG  Recent Labs Lab 03/25/16 0340 03/26/16 0410 03/27/16 0430  PHART 7.327* 7.460* 7.392  PCO2ART 44.0 35.2 41.1  PO2ART 88.7 115* 96.0    Liver Enzymes  Recent Labs Lab 03/23/16 0807 03/23/16 0902 03/24/16 1002  AST 36 27 20  ALT 26 23 16*   ALKPHOS 43 41 29*  BILITOT 0.8 0.7 1.2  ALBUMIN 3.4* 3.4* 2.7*    Cardiac Enzymes  Recent Labs Lab 03/23/16 2230 03/24/16 1002 03/24/16 1515  TROPONINI 0.24* 0.21* 0.21*    Glucose  Recent Labs Lab 03/27/16 0435 03/27/16 1008 03/27/16 1614 03/27/16 1937 03/28/16 0018 03/28/16 0340  GLUCAP 139* 188* 132* 153* 118* 125*    Imaging Dg Chest Port 1 View  Result Date: 03/27/2016 CLINICAL DATA:  ET tube EXAM: PORTABLE CHEST 1 VIEW COMPARISON:  03/26/2016 FINDINGS: Support devices including endotracheal tube are stable. Mild cardiomegaly. Bibasilar atelectasis. No overt edema or effusions. IMPRESSION: Mild cardiomegaly and bibasilar atelectasis. Electronically Signed   By: Charlett NoseKevin  Dover M.D.   On: 03/27/2016 08:00    STUDIES:  12/12 ct head > Hemorrhagic infarct in right frontal lobe 12/12 MRI Head > Subacute hematoma in right posterior frontal lobe measuring 31 cm  12/12 EEG > Diffuse Slowing  12/15 Echo > EF 45 to 50%, grade 1 DD, mild MR, PAS 51 mmHg  CULTURES: 12/12 bc x 2>> 12/12 sputum>> negative  ANTIBIOTICS: Vancomycin 12/13 > 12/16 Zosyn 12/13 >12/14 Levaquin 12/15 >>  SIGNIFICANT EVENTS:  12/12 pea arrest  LINES/TUBES: 12/12 et >> 12/16 12/12 l ij cvl>>  ASSESSMENT / PLAN:  Acute respiratory failure 2nd to acute pulmonary edema. Hx of tobacco abuse with COPD. - BDs  PEA cardiac arrest. Acute systolic CHF. Hx of CAD, HTN. - cardene gtt to keep SBP < 160 - continue lopressor IV   ICH. Hx of ETOH, CVA. - wean off precedex for RASS goal 0 - thiamine, folic acid - PT/OT  Aspiration pneumonia. - day 5/5 levaquin  Dysphagia. - f/u with speech therapy  Anemia of critical illness. - f/u CBC  Hyperglycemia. - SSI  Resolved problems >> Acute encephalopathy, AKI, Hyperkalemia, Metabolic acidosis  DVT prophylaxis - SCDs SUP - protonix Nutrition - NPO Goals of care - full code   Coralyn Helling, MD [facility] Pulmonary/Critical  Care 03/28/2016, 7:25 AM Pager:  [phone] After 3pm call: [phone]

## 2016-03-28 NOTE — Evaluation (Signed)
Physical Therapy Evaluation Patient Details Name: Zedekiah Kroenke MRN: [id number] DOB: 1951-04-04 Today's Date: 03/28/2016   History of Present Illness  65 yo male smoker with dyspnea, bloody sputum, and syncope with PEA cardiac arrest.  Found to have ICH on CT head, right posterior frontal lobe. He was intuabted 12/12-12/16. PMHx of CVA, CAD, ETOH, HTN.  Clinical Impression   Pt admitted with above diagnosis. Pt currently with functional limitations due to the deficits listed below (see PT Problem List). 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       Follow Up Recommendations Other (comment) (Post-acute rehab)    Equipment Recommendations  Rolling walker with 5" wheels;3in1 (PT)    Recommendations for Other Services       Precautions / Restrictions Precautions Precautions: Fall      Mobility  Bed Mobility Overal bed mobility: Needs Assistance Bed Mobility: Supine to Sit     Supine to sit: Mod assist;+2 for safety/equipment     General bed mobility comments: Cues for technqiue; Followed commands with incr time; mod assist to elevate trunk to sit and then stabilize in sitting; tending to L lean  Transfers Overall transfer level: Needs assistance   Transfers: Sit to/from Stand;Stand Pivot Transfers Sit to Stand: +2 safety/equipment;Mod assist Stand pivot transfers: +2 safety/equipment;Mod assist       General transfer comment: Good Bil LUE muscular support with rise to stand; Mod assist to rise and stabilize once standing; small pivot steps to chair with mod assist of 2, support  bilaterally  Ambulation/Gait                Stairs            Wheelchair Mobility    Modified [place] (Stroke Patients Only) Modified [place] (Stroke Patients Only) Pre-Morbid Halley Score: Moderately severe disability (Not exactly sure of pre-morbid status) Modified [place]: Moderately severe disability     Balance Overall balance assessment: Needs assistance Sitting-balance support: Feet supported Sitting balance-Leahy Scale: Zero (Approaching poor) Sitting balance - Comments: Noted that when his R hand contact bed for suport, he tended to push towards L side; needing near constant support to prevent fall posteriorly or to the left Postural control: Left lateral lean;Posterior lean Standing balance support: Bilateral upper extremity supported Standing balance-Leahy Scale: Zero                               Pertinent Vitals/Pain Pain Assessment: No/denies pain    Home Living Family/patient expects to be discharged to:: Unsure                      Prior Function           Comments: Poor historian; Unable to discern prior level of function; he was able to identify that his L side was effected by the CVA     Hand Dominance        Extremity/Trunk Assessment   Upper Extremity Assessment Upper Extremity Assessment: Defer to OT evaluation (Noting minimal use of L hand and UE during session;  used R hand to hold suction, but with little control)    Lower Extremity Assessment Lower Extremity Assessment: RLE deficits/detail;LLE deficits/detail;Generalized weakness;Difficult to assess due to impaired cognition RLE Deficits / Details: Moving hip knee and ankle actively and against gravity RLE Coordination: decreased gross motor;decreased fine motor LLE Deficits / Details: Moving hip knee and ankle actively and against gravity LLE Coordination: decreased fine motor;decreased gross motor       Communication   Communication: Other (comment)  (Very sleepy; did not talk much during session)  Cognition Arousal/Alertness: Lethargic;Suspect due to medications (Precedex was stopped earlier per RN) Behavior During Therapy: Flat affect Overall Cognitive Status: No family/caregiver present to determine baseline cognitive functioning                 General Comments: Very sleepy, and repeatedly asking to lay down and nap    General Comments General comments (skin integrity, edema, etc.): Quite hypertensive during session; RN aware    Exercises     Assessment/Plan    PT Assessment Patient needs continued PT services  PT Problem List Decreased strength;Decreased activity tolerance;Decreased balance;Decreased mobility;Decreased coordination;Decreased cognition;Decreased knowledge of use of DME;Decreased safety awareness          PT Treatment Interventions DME instruction;Gait training;Functional mobility training;Therapeutic activities;Therapeutic exercise;Balance training;Neuromuscular re-education;Cognitive remediation;Patient/family education    PT Goals (Current goals can be found in the Care Plan section)  Acute Rehab PT Goals Patient Stated Goal: Stated he wanted to sleep throughout session PT Goal Formulation: Patient unable to participate in goal setting Time For Goal Achievement: 04/11/16 Potential to Achieve Goals: Good    Frequency Min 3X/week   Barriers to discharge        Co-evaluation               End of Session Equipment Utilized During Treatment: Gait belt Activity Tolerance: Patient tolerated treatment well Patient left: in chair;with call bell/phone within reach;with nursing/sitter in room Nurse Communication: Mobility status         Time: 1050-1109 PT Time Calculation (min) (ACUTE ONLY): 19 min   Charges:   PT Evaluation $PT Eval Moderate Complexity: 1 Procedure     PT G CodesLevi Aland:        Cordney Barstow H Harace Mccluney 03/28/2016, 1:52 PM  Van ClinesHolly Jalysa Swopes, PT  Acute Rehabilitation  Services Pager [phone] Office [phone]

## 2016-03-28 NOTE — Progress Notes (Signed)
Patient Name: Randall Patel Date of Encounter: 03/28/2016  Primary Cardiologist: Dr. Geronimo Boot Problem List     Active Problems:   Cardiac arrest [facility])   Acute pulmonary edema (HCC)   Respiratory failure (HCC)   Sinus tachycardia   Patient Profile     65 year old with cardiorespiratory arrest (PEA) with known CAD DES to dRCA, prior EF 45%, mildly elevated flat troponin, acute respiratory failure, ETOH heavy use, subacute hematoma of right posterior frontal lobe. Hx of prior CVA 12/17 Echo EF 45-50%    Subjective   Extubated yday Significant agitation assoc with sinus tach and hypertension Rx w resumption of precidex and labetolol and titration of labetolol  Improved and verbal without chest pain  Inpatient Medications    Scheduled Meds: . folic acid  1 mg Intravenous Daily  . insulin aspart  0-9 Units Subcutaneous Q4H  . ipratropium-albuterol  3 mL Nebulization TID  . levofloxacin (LEVAQUIN) IV  750 mg Intravenous Q24H  . mouth rinse  15 mL Mouth Rinse BID  . metoprolol  5 mg Intravenous Q6H  . pantoprazole (PROTONIX) IV  40 mg Intravenous QHS  . sodium chloride flush  10-40 mL Intracatheter Q12H  . thiamine injection  100 mg Intravenous Daily   Continuous Infusions: . sodium chloride 50 mL/hr at 03/28/16 0303  . dexmedetomidine 0.7 mcg/kg/hr (03/28/16 0437)  . niCARDipine 7.5 mg/hr (03/28/16 0702)   PRN Meds: acetaminophen (TYLENOL) oral liquid 160 mg/5 mL, fentaNYL (SUBLIMAZE) injection, Influenza vac split quadrivalent PF, LORazepam, ondansetron (ZOFRAN) IV, pneumococcal 23 valent vaccine, sodium chloride flush   Vital Signs    Vitals:   03/28/16 0600 03/28/16 0630 03/28/16 0645 03/28/16 0700  BP: (!) 150/92 (!) 161/94 (!) 169/95 (!) 160/103  Pulse: 76 67 84 86  Resp: (!) 23 (!) 23 (!) 26 (!) 24  Temp: 99.5 F (37.5 C)     TempSrc:      SpO2: 96% 100% 100% 98%  Weight: 143 lb 1.6 oz (64.9 kg)     Height:        Intake/Output Summary (Last  24 hours) at 03/28/16 0757 Last data filed at 03/28/16 0700  Gross per 24 hour  Intake          3887.35 ml  Output             3000 ml  Net           887.35 ml   Filed Weights   03/26/16 0530 03/27/16 0447 03/28/16 0600  Weight: 146 lb 1.6 oz (66.3 kg) 144 lb 6.4 oz (65.5 kg) 143 lb 1.6 oz (64.9 kg)    Physical Exam    GEN: Sedate thin,ill appearing.somnolent   HEENT: Grossly normal.  Neck: Supple, no JVD, carotid bruits, or masses. Cardiac: Tachy RR, no murmurs, rubs, or gallops. No clubbing, cyanosis, edema.  Radials/DP/PT 2+ and equal bilaterally.  Respiratory:  Respirations regular and unlabored, clear to auscultation bilaterally. GI: Soft, nontender, nondistended, BS + x 4. MS: no deformity or atrophy. Skin: warm and dry, no rash. Neuro:  Strength and sensation are intact. Psych: more  responsive   Labs    CBC  Recent Labs  03/27/16 0430 03/28/16 0340  WBC 8.7 6.5  HGB 11.6* 10.5*  HCT 35.9* 32.7*  MCV 84.7 84.7  PLT 190 163   Basic Metabolic Panel  Recent Labs  03/27/16 0430 03/28/16 0340  NA 140 142  K 4.2 3.6  CL 105 110  CO2  24 24  GLUCOSE 132* 126*  BUN 23* 14  CREATININE 0.95 0.76  CALCIUM 9.4 9.0  MG 2.2 1.9  PHOS 1.7* 3.1   Liver Function Tests No results for input(s): AST, ALT, ALKPHOS, BILITOT, PROT, ALBUMIN in the last 72 hours. No results for input(s): LIPASE, AMYLASE in the last 72 hours. Cardiac Enzymes No results for input(s): CKTOTAL, CKMB, CKMBINDEX, TROPONINI in the last 72 hours. BNP Invalid input(s): POCBNP D-Dimer No results for input(s): DDIMER in the last 72 hours. Hemoglobin A1C No results for input(s): HGBA1C in the last 72 hours. Fasting Lipid Panel No results for input(s): CHOL, HDL, LDLCALC, TRIG, CHOLHDL, LDLDIRECT in the last 72 hours. Thyroid Function Tests No results for input(s): TSH, T4TOTAL, T3FREE, THYROIDAB in the last 72 hours.  Invalid input(s): FREET3  Telemetry    Telemetry Personally reviewed   Sinus rates about 100 some VTNS sinus tach yday   ECG     n/a   Radiology    Dg Chest Port 1 View  Result Date: 03/27/2016 CLINICAL DATA:  ET tube EXAM: PORTABLE CHEST 1 VIEW COMPARISON:  03/26/2016 FINDINGS: Support devices including endotracheal tube are stable. Mild cardiomegaly. Bibasilar atelectasis. No overt edema or effusions. IMPRESSION: Mild cardiomegaly and bibasilar atelectasis. Electronically Signed   By: Charlett NoseKevin  Dover M.D.   On: 03/27/2016 08:00    Cardiac Studies   Cath 2013  1. NSTEMI 2. Triple vessel CAD with occluded RCA, culprit vessel. 3. Successful PTCA/DES x 1 distal RCA 4. Successful PTCA with balloon angioplasty only of the posterolateral branch.  5. Segmental LV systolic dysfunction   Assessment & Plan    1. Cardiac Arrest - initially reported acute dyspnea but arrested upon EMS arrival. Initially in PEA then Asystole.  presemably this was a primary resp event   2. CAD - cath in 03/2012 showed 30% ostial LM stenosis, 30% pLAD stenosis, 60% mid-LADm 30% pCx, 3rd OM which was small in size with diffuse 90% stenosis involving both branches, and RCA with 50% mid-stenosis and 100% distal occlusion. This was treated with DES x1 to the dRCA with successful PTCA with balloon angioplasty only of the posterolateral branch being perfomed.   Cath ( intervention) precluded 2/2 hematoma - Will resume BB - metop 5mg  IV Q6 - restart ACE-I, and statin as he awakens   3. Nonischemic Cardiomyopathy - EF 40-45% by echocardiogram in 03/2015.   4. Elevated Troponin - cyclic troponin values at 0.04, 0.13, 0.21, 0.24, and 0.21. Likely secondary to demand ischemia in the setting of his cardiac arrest.  5. Respiratory Failure with Pulmonary Edema and Hemoptysis  Per CCM    6. Subacute Hematoma - MRI of brain this admission shows subacute hematoma in the right posterior frontal lobe measuring up to 31 mm.Per Neuro notes not felt to be related to acute trauma or  presentation with encephalopathy - Nicardipine started to assist with BP   Not much to do today x speech therapy and then feed No Cardiac testing anticipated  reinitiation of BB and ACE po when able  BP managed with nicardipine   Signed, Sherryl MangesSteven Maile Linford, MD  03/28/2016, 7:57 AM

## 2016-03-28 NOTE — Progress Notes (Signed)
eLink Physician-Brief Progress Note Patient Name: Randall Patel DOB: 1950/07/18 MRN: [id number]   Date of Service  03/28/2016  HPI/Events of Note  Htnive.  On cardene drip  eICU Interventions  Increase cardene drip, increase po meds     Intervention Category Major Interventions: Hypertension - evaluation and management  Shan Levans 03/28/2016, 4:32 PM

## 2016-03-28 NOTE — Progress Notes (Signed)
Inpatient Rehabilitation  Patient was screened by Weldon Picking for appropriateness for an Inpatient Acute Rehab consult upon PT recommendation.  At this time, we are recommending Inpatient Rehab consult.  Please order consult if you are agreeable.  Weldon Picking PT Inpatient Rehab Admissions Coordinator Cell [phone] Office [phone]

## 2016-03-29 ENCOUNTER — Encounter (HOSPITAL_COMMUNITY): Payer: Self-pay | Admitting: *Deleted

## 2016-03-29 ENCOUNTER — Inpatient Hospital Stay (HOSPITAL_COMMUNITY): Payer: Medicare HMO

## 2016-03-29 DIAGNOSIS — J810 Acute pulmonary edema: Secondary | ICD-10-CM

## 2016-03-29 LAB — GLUCOSE, CAPILLARY
GLUCOSE-CAPILLARY: 112 mg/dL — AB (ref 65–99)
GLUCOSE-CAPILLARY: 114 mg/dL — AB (ref 65–99)
GLUCOSE-CAPILLARY: 117 mg/dL — AB (ref 65–99)
GLUCOSE-CAPILLARY: 119 mg/dL — AB (ref 65–99)
Glucose-Capillary: 132 mg/dL — ABNORMAL HIGH (ref 65–99)
Glucose-Capillary: 157 mg/dL — ABNORMAL HIGH (ref 65–99)
Glucose-Capillary: 108 mg/dL — ABNORMAL HIGH (ref 65–99)

## 2016-03-29 LAB — BASIC METABOLIC PANEL
ANION GAP: 12 (ref 5–15)
BUN: 13 mg/dL (ref 6–20)
CALCIUM: 10 mg/dL (ref 8.9–10.3)
CO2: 21 mmol/L — ABNORMAL LOW (ref 22–32)
CREATININE: 0.77 mg/dL (ref 0.61–1.24)
Chloride: 104 mmol/L (ref 101–111)
Glucose, Bld: 118 mg/dL — ABNORMAL HIGH (ref 65–99)
Potassium: 3.4 mmol/L — ABNORMAL LOW (ref 3.5–5.1)
SODIUM: 137 mmol/L (ref 135–145)

## 2016-03-29 LAB — PHOSPHORUS: Phosphorus: 3.6 mg/dL (ref 2.5–4.6)

## 2016-03-29 LAB — CBC
HCT: 40.8 % (ref 39.0–52.0)
Hemoglobin: 13.4 g/dL (ref 13.0–17.0)
MCH: 27.7 pg (ref 26.0–34.0)
MCHC: 32.8 g/dL (ref 30.0–36.0)
MCV: 84.5 fL (ref 78.0–100.0)
PLATELETS: 224 10*3/uL (ref 150–400)
RBC: 4.83 MIL/uL (ref 4.22–5.81)
RDW: 14.7 % (ref 11.5–15.5)
WBC: 10.2 10*3/uL (ref 4.0–10.5)

## 2016-03-29 LAB — MAGNESIUM: Magnesium: 2.6 mg/dL — ABNORMAL HIGH (ref 1.7–2.4)

## 2016-03-29 MED ORDER — FOLIC ACID 1 MG PO TABS
1.0000 mg | ORAL_TABLET | Freq: Every day | ORAL | Status: DC
  Administered 2016-03-29 – 2016-03-31 (×3): 1 mg via ORAL
  Filled 2016-03-29 (×3): qty 1

## 2016-03-29 MED ORDER — POTASSIUM CHLORIDE 20 MEQ/15ML (10%) PO SOLN
40.0000 meq | Freq: Once | ORAL | Status: AC
  Administered 2016-03-29: 40 meq via ORAL
  Filled 2016-03-29: qty 30

## 2016-03-29 MED ORDER — METOPROLOL TARTRATE 5 MG/5ML IV SOLN
5.0000 mg | INTRAVENOUS | Status: DC | PRN
  Administered 2016-03-29 – 2016-03-30 (×2): 5 mg via INTRAVENOUS
  Filled 2016-03-29 (×3): qty 5

## 2016-03-29 MED ORDER — HYDROCODONE-ACETAMINOPHEN 5-325 MG PO TABS
1.0000 | ORAL_TABLET | ORAL | Status: DC | PRN
  Administered 2016-03-29: 2 via ORAL
  Administered 2016-03-29: 1 via ORAL
  Administered 2016-03-30: 2 via ORAL
  Administered 2016-03-30 – 2016-03-31 (×2): 1 via ORAL
  Filled 2016-03-29: qty 1
  Filled 2016-03-29 (×2): qty 2
  Filled 2016-03-29 (×2): qty 1
  Filled 2016-03-29: qty 2

## 2016-03-29 MED ORDER — ACETAMINOPHEN 325 MG PO TABS: 650.0000 mg | ORAL_TABLET | Freq: Four times a day (QID) | ORAL | Status: DC | PRN

## 2016-03-29 MED ORDER — HYDRALAZINE HCL 20 MG/ML IJ SOLN
INTRAMUSCULAR | Status: AC
  Filled 2016-03-29: qty 1

## 2016-03-29 MED ORDER — ADULT MULTIVITAMIN W/MINERALS CH
1.0000 | ORAL_TABLET | Freq: Every day | ORAL | Status: DC
  Administered 2016-03-29 – 2016-03-31 (×3): 1 via ORAL
  Filled 2016-03-29 (×3): qty 1

## 2016-03-29 MED ORDER — HYDRALAZINE HCL 20 MG/ML IJ SOLN
10.0000 mg | INTRAMUSCULAR | Status: DC | PRN
  Administered 2016-03-29 – 2016-03-30 (×3): 20 mg via INTRAVENOUS
  Filled 2016-03-29 (×3): qty 1

## 2016-03-29 MED ORDER — MAGNESIUM SULFATE 2 GM/50ML IV SOLN
2.0000 g | Freq: Once | INTRAVENOUS | Status: DC
  Filled 2016-03-29: qty 50

## 2016-03-29 MED ORDER — VITAMIN B-1 100 MG PO TABS
100.0000 mg | ORAL_TABLET | Freq: Every day | ORAL | Status: DC
  Administered 2016-03-29 – 2016-03-31 (×3): 100 mg via ORAL
  Filled 2016-03-29 (×3): qty 1

## 2016-03-29 MED ORDER — GUAIFENESIN 100 MG/5ML PO SOLN
5.0000 mL | ORAL | Status: DC | PRN
  Administered 2016-03-29: 100 mg via ORAL
  Filled 2016-03-29: qty 5

## 2016-03-29 NOTE — Progress Notes (Signed)
[facility] ADULT ICU REPLACEMENT PROTOCOL FOR AM LAB REPLACEMENT ONLY  The patient does not apply for the [facility] Adult ICU Electrolyte Replacment Protocol based on the criteria listed below:   1. Is GFR >/= 40 ml/min? Yes.    Patient's GFR today is >60 2. Is urine output >/= 0.5 ml/kg/hr for the last 6 hours? No. Patient's UOP is NONE CHARTED ml/kg/hr 3. Is BUN < 60 mg/dL? Yes.    Patient's BUN today is 13 4. Abnormal electrolyte(s): K+3.4 5. Ordered repletion with: NA 6. If a panic level lab has been reported, has the CCM MD in charge been notified? Yes.  .   Physician:  [place] Hilliard 03/29/2016 5:06 AM

## 2016-03-29 NOTE — Progress Notes (Signed)
eLink Physician-Brief Progress Note Patient Name: Randall Patel DOB: 01-22-1951 MRN: [id number]   Date of Service  03/29/2016  HPI/Events of Note  K 3.4  eICU Interventions  Ordered 40 meq KCL replacement PO        [name] 03/29/2016, 5:19 AM

## 2016-03-29 NOTE — Progress Notes (Signed)
PULMONARY / CRITICAL CARE MEDICINE   Name: Randall Patel MRN: [id number] DOB: 06/28/1950    ADMISSION DATE:  03/23/2016   REFERRING MD:  EDP - [place]  CHIEF COMPLAINT:  PEA arrest  Brief:   65 yo AAM with hx of stroke x 2(last left hemorrhagic with rt hand residual weaknes) CAD with past MI, Etoh with 5th bourbon every 2 days, tobacco 1/2 ppd life long, HTN who was admitteD 12/12 to [facility] ed. He walked into sister's bedroom and co sob. Found in br with bloody sputum and unresponsive but breathing. EMS was activated and they transported to [facility] ED but in route had PEA arrest and required Epi x 1 and intubation with #7 ett. Found to be acidotic ph 7.12, pco2 66, po2 47 in ed. [name] pulmonary edema on exam and cxr. Vent changes, diuresis, sedation change made with improved ventilation. He will need CT head to ro ICH prior to hypothermia protocol.  SUBJECTIVE:  No events overnight. This am remains of low dose Cardene.   VITAL SIGNS: BP 136/86   Pulse 70   Temp 99.4 F (37.4 C) (Oral)   Resp (!) 26   Ht 5\' 6"  (1.676 m)   Wt 62.8 kg (138 lb 7.2 oz)   SpO2 90%   BMI 22.35 kg/m   VENTILATOR SETTINGS:    INTAKE / OUTPUT: I/O last 3 completed shifts: In: 3346.5 [I.V.:3296.5; IV Piggyback:50] Out: 2400 [Urine:2400]  PHYSICAL EXAMINATION: General:  Adult male, lying in bed, non-productive cough  Neuro: Alert, follows commands, slow responses  HEENT:  normocephalic. Cardiovascular:  No MRG, RRR, s1/s2 Lungs: Unlabored, diminished breath sounds  Abdomen:  Soft, active bowel sounds  Musculoskeletal:  No deformities  Skin:  Warm and dry, intact   LABS:  BMET  Recent Labs Lab 03/27/16 0430 03/28/16 0340 03/29/16 0330  NA 140 142 137  K 4.2 3.6 3.4*  CL 105 110 104  CO2 24 24 21*  BUN 23* 14 13  CREATININE 0.95 0.76 0.77  GLUCOSE 132* 126* 118*   Electrolytes  Recent Labs Lab 03/26/16 1630 03/27/16 0430 03/28/16 0340 03/29/16 0330  CALCIUM  --  9.4 9.0 10.0  MG  1.8 2.2 1.9  --   PHOS 2.2* 1.7* 3.1  --     CBC  Recent Labs Lab 03/27/16 0430 03/28/16 0340 03/29/16 0408  WBC 8.7 6.5 10.2  HGB 11.6* 10.5* 13.4  HCT 35.9* 32.7* 40.8  PLT 190 163 224    Coag's  Recent Labs Lab 03/23/16 0807  APTT 20*  INR 0.98    Sepsis Markers  Recent Labs Lab 03/23/16 0807 03/23/16 0950 03/23/16 1400 03/23/16 1700 03/24/16 0300 03/25/16 0325  LATICACIDVEN  --  1.8 1.8 1.7  --   --   PROCALCITON 0.47  --   --   --  3.93 1.67    ABG  Recent Labs Lab 03/25/16 0340 03/26/16 0410 03/27/16 0430  PHART 7.327* 7.460* 7.392  PCO2ART 44.0 35.2 41.1  PO2ART 88.7 115* 96.0    Liver Enzymes  Recent Labs Lab 03/23/16 0807 03/23/16 0902 03/24/16 1002  AST 36 27 20  ALT 26 23 16*  ALKPHOS 43 41 29*  BILITOT 0.8 0.7 1.2  ALBUMIN 3.4* 3.4* 2.7*    Cardiac Enzymes  Recent Labs Lab 03/23/16 2230 03/24/16 1002 03/24/16 1515  TROPONINI 0.24* 0.21* 0.21*    Glucose  Recent Labs Lab 03/28/16 1213 03/28/16 1636 03/28/16 2050 03/28/16 2325 03/29/16 0348 03/29/16 0915  GLUCAP 111*  136* 110* 117* 114* 132*    Imaging No results found.  STUDIES:  12/12 ct head > Hemorrhagic infarct in right frontal lobe 12/12 MRI Head > Subacute hematoma in right posterior frontal lobe measuring 31 cm  12/12 EEG > Diffuse Slowing   CULTURES: 12/12 bc x 2>> 12/12 sputum>>  ANTIBIOTICS: Vancomycin 12/13 > 12/16 Zosyn 12/13 >12/14 Levaquin 12/15 >>  SIGNIFICANT EVENTS: 12/12 pea arrest  LINES/TUBES: 12/12 et >> 12/16 12/12 l ij cvl>>  DISCUSSION: 65 yo AAM with hx of stroke x 2(last left hemorrhagic with rt hand residual weaknes) CAD with past MI, Etoh with 5th bourbon every 2 days, tobacco 1/2 ppd life long, HTN who was admitteD [facility]12 [facility]. He walked into sister's bedroom and co sob. Found in br with bloody sputum and unresponsive but breathing. EMS was activated and [facility] but in route had PEA arrest  and required Epi x 1 and intubation with #7 ett. Found to be acidotic ph 7.12, pco2 66, po2 47 in ed. Homero FellersFrank pulmonary edema on exam and cxr. Vent changes, diuresis, sedation change made with improved ventilation. He will need CT head to ro ICH prior to hypothermia protocol.  ASSESSMENT / PLAN:  PULMONARY A: Resp failure with pulmonary edema Hemoptysis COPD with tobacco abuse Profound hypoxia and hypercarbia  P:   Supplemental oxygen for goal > 92 Mobilize  IS  BDs  CARDIOVASCULAR A:  Hx of MI HTN Heavy ETOH/Tobacco use -Echo EF of 35% P:  Cardiac Monitoring  Continue Norvasc 10 mg Daily  Continue Metoprolol 100 mg BID  Add PRN metoprolol for systolic >160 D/C Cardene   RENAL A:   Acute renal insuff (Baseline Crt 1.2)  Hypokalemia P:   Trend BMP Replace electrolytes as needed   GASTROINTESTINAL A:   Dysphagia P:   Dys Diet 2  Continue to follow with Speech   HEMATOLOGIC A:   No acute issue P:  Trend CBC  Transfuse per ICU protocol  INFECTIOUS A:   ?Aspiration Event during intubation  P:   Continue Levaquin  Trend WBC and Fever Curve   ENDOCRINE A:   Hyperglycemia P:   SSI  NEUROLOGIC A:   Frontal Lobe ICH >> neuro s/o Hx of 2 strokes last 2 years with rt hand weakness H/O ETOH, Seizures  P:   Prn ativan D/C Fentanyl and Precedex  Add PRN Norco and Tylenol  Thiamine, folic acid, multivitamin   Jovita KussmaulKatalina Kearston Putman, AG-ACNP [facility]  Pgr: [phone]  PCCM Pgr: [phone]

## 2016-03-29 NOTE — Progress Notes (Signed)
Patient Name: Randall Patel Date of Encounter: 03/29/2016  Primary Cardiologist: Dr. Geronimo Boot Problem List     Active Problems:   Cardiac arrest [facility])   Acute pulmonary edema (HCC)   Respiratory failure (HCC)   Sinus tachycardia   Patient Profile     65 year old with cardiorespiratory arrest (PEA) with known CAD DES to dRCA, prior EF 45%, mildly elevated flat troponin, acute respiratory failure, ETOH heavy use, subacute hematoma of right posterior frontal lobe. Hx of prior CVA 12/17 Echo EF 45-50%    Subjective     Inpatient Medications    Scheduled Meds: . amLODipine  10 mg Oral Daily  . folic acid  1 mg Oral Daily  . insulin aspart  0-9 Units Subcutaneous Q4H  . mouth rinse  15 mL Mouth Rinse BID  . metoprolol tartrate  100 mg Oral BID  . multivitamin with minerals  1 tablet Oral Daily  . sodium chloride flush  10-40 mL Intracatheter Q12H  . thiamine  100 mg Oral Daily   Continuous Infusions: . sodium chloride 50 mL/hr at 03/28/16 0700   PRN Meds: acetaminophen, fentaNYL (SUBLIMAZE) injection, guaiFENesin, HYDROcodone-acetaminophen, Influenza vac split quadrivalent PF, ipratropium-albuterol, LORazepam, metoprolol, ondansetron (ZOFRAN) IV, pneumococcal 23 valent vaccine, sodium chloride flush   Vital Signs    Vitals:   03/29/16 0800 03/29/16 0958 03/29/16 1000 03/29/16 1100  BP: 136/86 (!) 151/88 (!) 153/88 (!) 149/84  Pulse: 70  82 62  Resp: (!) 26  (!) 22 18  Temp:      TempSrc:      SpO2: 90%  97% 99%  Weight:      Height:        Intake/Output Summary (Last 24 hours) at 03/29/16 1114 Last data filed at 03/29/16 0600  Gross per 24 hour  Intake          1427.16 ml  Output             1325 ml  Net           102.16 ml   Filed Weights   03/27/16 0447 03/28/16 0600 03/29/16 0600  Weight: 65.5 kg (144 lb 6.4 oz) 64.9 kg (143 lb 1.6 oz) 62.8 kg (138 lb 7.2 oz)    Physical Exam    GEN: Sedate thin,ill appearing.somnolent   HEENT: Grossly  normal.  Neck: Supple, no JVD, carotid bruits, or masses. Cardiac: Tachy RR, no murmurs, rubs, or gallops. No clubbing, cyanosis, edema.  Radials/DP/PT 2+ and equal bilaterally.  Respiratory:  Respirations regular and unlabored, clear to auscultation bilaterally. GI: Soft, nontender, nondistended, BS + x 4. MS: no deformity or atrophy. Skin: warm and dry, no rash. Neuro:  Strength and sensation are intact. Psych: more  responsive   Labs    CBC  Recent Labs  03/28/16 0340 03/29/16 0408  WBC 6.5 10.2  HGB 10.5* 13.4  HCT 32.7* 40.8  MCV 84.7 84.5  PLT 163 224   Basic Metabolic Panel  Recent Labs  03/27/16 0430 03/28/16 0340 03/29/16 0330  NA 140 142 137  K 4.2 3.6 3.4*  CL 105 110 104  CO2 24 24 21*  GLUCOSE 132* 126* 118*  BUN 23* 14 13  CREATININE 0.95 0.76 0.77  CALCIUM 9.4 9.0 10.0  MG 2.2 1.9  --   PHOS 1.7* 3.1  --    Liver Function Tests No results for input(s): AST, ALT, ALKPHOS, BILITOT, PROT, ALBUMIN in the last 72 hours. No results  for input(s): LIPASE, AMYLASE in the last 72 hours. Cardiac Enzymes No results for input(s): CKTOTAL, CKMB, CKMBINDEX, TROPONINI in the last 72 hours. BNP Invalid input(s): POCBNP D-Dimer No results for input(s): DDIMER in the last 72 hours. Hemoglobin A1C No results for input(s): HGBA1C in the last 72 hours. Fasting Lipid Panel No results for input(s): CHOL, HDL, LDLCALC, TRIG, CHOLHDL, LDLDIRECT in the last 72 hours. Thyroid Function Tests No results for input(s): TSH, T4TOTAL, T3FREE, THYROIDAB in the last 72 hours.  Invalid input(s): FREET3  Telemetry    Telemetry Personally reviewed  Sinus rates about 100 some VTNS sinus tach yday   ECG     n/a   Radiology    No results found.  Cardiac Studies   Cath 2013  1. NSTEMI 2. Triple vessel CAD with occluded RCA, culprit vessel. 3. Successful PTCA/DES x 1 distal RCA 4. Successful PTCA with balloon angioplasty only of the posterolateral branch.  5.  Segmental LV systolic dysfunction   Assessment & Plan    1. Cardiac Arrest - initially reported acute dyspnea but arrested upon EMS arrival. Initially in PEA then Asystole.  presemably this was a primary resp event   2. CAD - cath in 03/2012 showed 30% ostial LM stenosis, 30% pLAD stenosis, 60% mid-LADm 30% pCx, 3rd OM which was small in size with diffuse 90% stenosis involving both branches, and RCA with 50% mid-stenosis and 100% distal occlusion. This was treated with DES x1 to the dRCA with successful PTCA with balloon angioplasty only of the posterolateral branch being perfomed.   Cath ( intervention) precluded 2/2 hematoma - Will resume BB - metop 5mg  IV Q6 - restart ACE-I, and statin as he awakens   3. Nonischemic Cardiomyopathy - EF 40-45% by echocardiogram in 03/2015.   4. Elevated Troponin - cyclic troponin values at 0.04, 0.13, 0.21, 0.24, and 0.21. Likely secondary to demand ischemia in the setting of his cardiac arrest.  5. Respiratory Failure with Pulmonary Edema and Hemoptysis  Per CCM    6. Subacute Hematoma -complains of a head ache .   MRI of brain this admission shows subacute hematoma in the right posterior frontal lobe measuring up to 31 mm.Per Neuro notes not felt to be related to acute trauma or presentation with encephalopathy - Transitioning to po meds.   BP is improving   Signed, Kristeen MissPhilip Surah Pelley, MD  03/29/2016, 11:14 AM

## 2016-03-29 NOTE — Progress Notes (Signed)
eLink Physician-Brief Progress Note Patient Name: Ryanchristopher Gropp DOB: 12-11-1950 MRN: [id number]   Date of Service  03/29/2016  HPI/Events of Note  BP high  eICU Interventions  Head CT reviewed - no change hydralalzine prn     Intervention Category Intermediate Interventions: Hypertension - evaluation and management  Estera Ozier V. 03/29/2016, 6:16 PM

## 2016-03-29 NOTE — Progress Notes (Signed)
OT Cancellation Note  Patient Details Name: Randall Patel MRN: [id number] DOB: June 24, 1950   Cancelled Treatment:    Reason Eval/Treat Not Completed: Medical issues which prohibited therapy.  Pt with complaint of headache.  Will try back tomorrow.  Maksym Pfiffner [place], OTR/L [phone]   Jeani Hawking M 03/29/2016, 3:22 PM

## 2016-03-29 NOTE — Progress Notes (Signed)
Speech Language Pathology Treatment: Dysphagia  Patient Details Name: Randall Patel MRN: [id number] DOB: [birth year] Today's Date: 03/29/2016 Time: 1052-1100 SLP Time Calculation (min) (ACUTE ONLY): 8 min  Assessment / Plan / Recommendation Clinical Impression  Pt had baseline coughing upon SLP arrival. Min cues were provided for increased effort, which resulted in cough productive of mild, thin secretions which were orally suctioned. Pt did not have any further coughing while SLP was in the room, including during intake of water via straw. Pt politely declined solid POs, stating that he had a bad headache (RN aware). Recommend to continue current diet and precautions for now with additional SLP f/u for tolerance.   HPI HPI: 65 yo male smoker with dyspnea, bloody sputum, and syncope with PEA cardiac arrest. Found to have ICH on CT head, right posterior frontal lobe. He was intuabted 12/12-12/16. PMHx of CVA with Rt hand weakness, CAD, ETOH, HTN. Pt was seen by SLP for dysphagia following stroke in12/2016. He initially was started on Dys 2 diet and nectar thick liquids. He went through repeat MBS while on inpatient rehab and was upgraded to Dys 2 diet and thin liquids prior to discharge.      SLP Plan  Continue with current plan of care     Recommendations  Diet recommendations: Dysphagia 2 (fine chop);Thin liquid Liquids provided via: Cup;Straw Medication Administration: Crushed with puree Supervision: Staff to assist with self feeding;Full supervision/cueing for compensatory strategies Compensations: Minimize environmental distractions;Slow rate;Small sips/bites;Lingual sweep for clearance of pocketing;Monitor for anterior loss;Follow solids with liquid Postural Changes and/or Swallow Maneuvers: Seated upright 90 degrees;Upright 30-60 min after meal                Oral Care Recommendations: Oral care BID Follow up Recommendations: Other (comment) (tba) Plan: Continue with current  plan of care       GO                [name] 03/29/2016, 12:48 PM  [name], M.A. CCC-SLP [phone]

## 2016-03-29 NOTE — Progress Notes (Addendum)
Physical Therapy Treatment Patient Details Name: Randall Patel MRN: [id number] DOB: 1950-11-25 Today's Date: 03/29/2016    History of Present Illness 65 yo male smoker with dyspnea, bloody sputum, and syncope with PEA cardiac arrest.  Found to have ICH on CT head, right posterior frontal lobe. He was intuabted 12/12-12/16. PMHx of CVA, CAD, ETOH, HTN.    PT Comments    Pt with slow progress. Pt continues to have impaired functional use of rt and lt UE.  Follow Up Recommendations  CIR     Equipment Recommendations   (To be determined)    Recommendations for Other Services       Precautions / Restrictions Precautions Precautions: Fall Restrictions Weight Bearing Restrictions: No    Mobility  Bed Mobility Overal bed mobility: Needs Assistance Bed Mobility: Supine to Sit;Sit to Supine     Supine to sit: Mod assist;+2 for safety/equipment Sit to supine: Mod assist;+2 for safety/equipment   General bed mobility comments: Cues for technique and assist to elevate trunk into sitting and bring hips to EOB  Transfers Overall transfer level: Needs assistance Equipment used: Rolling walker (2 wheeled) Transfers: Sit to/from [employer] Transfers Sit to Stand: +2 safety/equipment;Min assist Stand pivot transfers: +2 safety/equipment;Min assist       General transfer comment: Assist to bring hips up and for balance. Manual facilitation to place hands on walker.  Pt with hands on walker for pivot but not really using walker for support due to poor grip   Ambulation/Gait Ambulation/Gait assistance: Min assist;+2 safety/equipment Ambulation Distance (Feet): 10 Feet Assistive device: Rolling walker (2 wheeled) Gait Pattern/deviations: Step-through pattern;Decreased step length - right;Decreased step length - left;Drifts right/left;Trunk flexed Gait velocity: decr Gait velocity interpretation: Below normal speed for age/gender General Gait Details: Assist for balance and  support. Assist to move and guide walker with pt resting hands on walker after placed there but very little support from walker.   Stairs            Wheelchair Mobility    Modified Roy (Stroke Patients Only) Modified Bastien (Stroke Patients Only) Pre-Morbid [place] Score: Moderately severe disability (Not exactly sure of pre-morbid status) Modified Coll[place]: Moderately severe disability     Balance Overall balance assessment: Needs assistance Sitting-balance support: Feet supported Sitting balance-Leahy Scale: Poor (Approaching poor) Sitting balance - Comments: Sat EOB of bed x 5 minutes with min A Postural control: Posterior lean;Right lateral lean;Left lateral lean Standing balance support: Bilateral upper extremity supported Standing balance-Leahy Scale: Poor Standing balance comment: Min A with hands supported on walker with min A                    Cognition Arousal/Alertness: Awake/alert Behavior During Therapy: Flat affect Overall Cognitive Status: No family/caregiver present to determine baseline cognitive functioning Area of Impairment: Orientation;Attention;Memory;Safety/judgement;Problem solving Orientation Level: Disoriented to;Time;Situation Current Attention Level: Sustained Memory: Decreased short-term memory   Safety/Judgement: Decreased awareness of safety;Decreased awareness of deficits   Problem Solving: Slow processing;Decreased initiation;Requires verbal cues;Requires tactile cues General Comments: Pt seems to have inattention to rt and lt upper extremity    Exercises      General Comments        Pertinent Vitals/Pain Pain Assessment: 0-10 Pain Score: 8  Pain Location: head Pain Descriptors / Indicators: Headache Pain Intervention(s): Limited activity within patient's tolerance;Monitored during session    Home Living  Prior Function            PT Goals (current goals can now be found in the care  plan section) Progress towards PT goals: Progressing toward goals    Frequency    Min 3X/week      PT Plan      Co-evaluation             End of Session Equipment Utilized During Treatment: Gait belt Activity Tolerance: Patient tolerated treatment well Patient left: with call bell/phone within reach;in bed;with bed alarm set     Time: 1215-1239 PT Time Calculation (min) (ACUTE ONLY): 24 min  Charges:  $Gait Training: 23-37 mins                    G CodesAngelina Ok:      Kissy Cielo W [place] 03/29/2016, 4:05 PM [employer] CorporationCary Niara Bunker PT [phone]

## 2016-03-30 ENCOUNTER — Encounter (HOSPITAL_COMMUNITY): Payer: Self-pay

## 2016-03-30 DIAGNOSIS — I2510 Atherosclerotic heart disease of native coronary artery without angina pectoris: Secondary | ICD-10-CM

## 2016-03-30 DIAGNOSIS — R52 Pain, unspecified: Secondary | ICD-10-CM

## 2016-03-30 DIAGNOSIS — J9600 Acute respiratory failure, unspecified whether with hypoxia or hypercapnia: Secondary | ICD-10-CM

## 2016-03-30 DIAGNOSIS — R9089 Other abnormal findings on diagnostic imaging of central nervous system: Secondary | ICD-10-CM

## 2016-03-30 DIAGNOSIS — F1010 Alcohol abuse, uncomplicated: Secondary | ICD-10-CM

## 2016-03-30 DIAGNOSIS — D72829 Elevated white blood cell count, unspecified: Secondary | ICD-10-CM

## 2016-03-30 DIAGNOSIS — I10 Essential (primary) hypertension: Secondary | ICD-10-CM

## 2016-03-30 DIAGNOSIS — R739 Hyperglycemia, unspecified: Secondary | ICD-10-CM

## 2016-03-30 DIAGNOSIS — R0682 Tachypnea, not elsewhere classified: Secondary | ICD-10-CM

## 2016-03-30 DIAGNOSIS — I25119 Atherosclerotic heart disease of native coronary artery with unspecified angina pectoris: Secondary | ICD-10-CM

## 2016-03-30 LAB — GLUCOSE, CAPILLARY
GLUCOSE-CAPILLARY: 122 mg/dL — AB (ref 65–99)
GLUCOSE-CAPILLARY: 122 mg/dL — AB (ref 65–99)
GLUCOSE-CAPILLARY: 120 mg/dL — AB (ref 65–99)
Glucose-Capillary: 127 mg/dL — ABNORMAL HIGH (ref 65–99)

## 2016-03-30 LAB — CBC
HEMATOCRIT: 41.3 % (ref 39.0–52.0)
HEMOGLOBIN: 13.9 g/dL (ref 13.0–17.0)
MCH: 27.7 pg (ref 26.0–34.0)
MCHC: 33.7 g/dL (ref 30.0–36.0)
MCV: 82.3 fL (ref 78.0–100.0)
PLATELETS: 302 10*3/uL (ref 150–400)
RBC: 5.02 MIL/uL (ref 4.22–5.81)
RDW: 14.2 % (ref 11.5–15.5)
WBC: 11 10*3/uL — AB (ref 4.0–10.5)

## 2016-03-30 LAB — BASIC METABOLIC PANEL
ANION GAP: 14 (ref 5–15)
BUN: 18 mg/dL (ref 6–20)
CHLORIDE: 105 mmol/L (ref 101–111)
CO2: 21 mmol/L — AB (ref 22–32)
Calcium: 10.1 mg/dL (ref 8.9–10.3)
Creatinine, Ser: 0.71 mg/dL (ref 0.61–1.24)
GFR calc non Af Amer: >60 mL/min (ref >60)
Glucose, Bld: 123 mg/dL — ABNORMAL HIGH (ref 65–99)
POTASSIUM: 3.5 mmol/L (ref 3.5–5.1)
SODIUM: 140 mmol/L (ref 135–145)

## 2016-03-30 LAB — PHOSPHORUS: Phosphorus: 3.7 mg/dL (ref 2.5–4.6)

## 2016-03-30 LAB — MAGNESIUM: Magnesium: 2.2 mg/dL (ref 1.7–2.4)

## 2016-03-30 MED ORDER — SPIRONOLACTONE 25 MG PO TABS
25.0000 mg | ORAL_TABLET | Freq: Every day | ORAL | Status: DC
  Administered 2016-03-30: 25 mg via ORAL
  Filled 2016-03-30: qty 1

## 2016-03-30 MED ORDER — SPIRONOLACTONE 25 MG PO TABS
25.0000 mg | ORAL_TABLET | Freq: Every day | ORAL | Status: DC
  Administered 2016-03-31: 25 mg via ORAL
  Filled 2016-03-30: qty 1

## 2016-03-30 MED ORDER — BOOST / RESOURCE BREEZE PO LIQD
1.0000 | Freq: Three times a day (TID) | ORAL | Status: DC
  Administered 2016-03-30 – 2016-03-31 (×2): 1 via ORAL

## 2016-03-30 MED ORDER — SPIRONOLACTONE 25 MG PO TABS: 50.0000 mg | ORAL_TABLET | Freq: Two times a day (BID) | ORAL | Status: DC

## 2016-03-30 MED ORDER — FLUTICASONE PROPIONATE 50 MCG/ACT NA SUSP
2.0000 | Freq: Every day | NASAL | Status: DC
  Administered 2016-03-30 – 2016-03-31 (×2): 2 via NASAL
  Filled 2016-03-30: qty 16

## 2016-03-30 MED ORDER — METOPROLOL TARTRATE 5 MG/5ML IV SOLN: 10.0000 mg | INTRAVENOUS | Status: DC | PRN

## 2016-03-30 MED ORDER — POTASSIUM CHLORIDE CRYS ER 20 MEQ PO TBCR
20.0000 meq | EXTENDED_RELEASE_TABLET | ORAL | Status: AC
  Administered 2016-03-30 (×2): 20 meq via ORAL
  Filled 2016-03-30 (×2): qty 1

## 2016-03-30 MED ORDER — METOPROLOL TARTRATE 5 MG/5ML IV SOLN
5.0000 mg | INTRAVENOUS | Status: DC | PRN
  Administered 2016-03-31: 5 mg via INTRAVENOUS
  Filled 2016-03-30: qty 5

## 2016-03-30 NOTE — Progress Notes (Signed)
Nutrition Follow-up  INTERVENTION:   Boost Breeze po TID, each supplement provides 250 kcal and 9 grams of protein  NUTRITION DIAGNOSIS:   Inadequate oral intake related to  (decreased appetite and confusion) as evidenced by meal completion < 50%. Ongoing.  GOAL:   Patient will meet greater than or equal to 90% of their needs  Progressing.   MONITOR:   PO intake, Supplement acceptance, Diet advancement, I & O's  ASSESSMENT:   Pt with hx of stroke x 2 (last left hemorrhagic with rt hand residual weaknes) CAD with past MI, ETOH with 5th bourbon every 2 days, tobacco 1/2 ppd life long, HTN who had PEA arrest en route to hospital per CT new ICH, intubated.   12/16 extubated 12/17 diet advanced to Dysphagia 2 with Thin Liquids Pt refusing to eat per RN. Pt confused but states he wants to drink liquids and likes juice  Medications reviewed and include: folic acid, mag sulfate, MVI, thiamine  Diet Order:  DIET DYS 2 Room service appropriate? Yes; Fluid consistency: Thin  Skin:  Reviewed, no issues  Last BM:  12/18  Height:   Ht Readings from Last 1 Encounters:  03/23/16 5\' 6"  (1.676 m)    Weight:   Wt Readings from Last 1 Encounters:  03/30/16 138 lb 0.1 oz (62.6 kg)    Ideal Body Weight:  64.5 kg  BMI:  Body mass index is 22.28 kg/m.  Estimated Nutritional Needs:   Kcal:  1800-2000  Protein:  90-115 grams  Fluid:  >2 L/day  EDUCATION NEEDS:   No education needs identified at this time  Kendell Bane RD, LDN, CNSC [phone] Pager [phone] After Hours Pager

## 2016-03-30 NOTE — Progress Notes (Signed)
I spoke with pt's son, Zenaido Gyure, by phone. He will discuss dispo plans with pt's sister, [name], and let me know rehab venue options. I have also left [name], pt's sister, a voicemail to cal me. I await OT eval for insurance needs.  [phone]

## 2016-03-30 NOTE — Progress Notes (Signed)
I met briefly with pt at bedside. I am familiar with pt from his previous inpt rehab admission 03/2015. With his permission, I will contact his sister, Harolyn Rutherford to discuss plans for his rehabilitation. [phone]

## 2016-03-30 NOTE — Evaluation (Signed)
Occupational Therapy Evaluation Patient Details Name: Randall Patel MRN: [id number] DOB: [birth year] Today's Date: 03/30/2016    History of Present Illness 65 yo male smoker with dyspnea, bloody sputum, and syncope with PEA cardiac arrest.  Found to have ICH on CT head, right posterior frontal lobe. He was intuabted 12/12-12/16. PMHx of CVA, CAD, ETOH, HTN.   Clinical Impression   Pt admitted with above. He demonstrates the below listed deficits and will benefit from continued OT to maximize safety and independence with BADLs.  Pt presents to OT with Lt hemiparesis, decreased trunk control, impaired balance, impaired cognition.  He currently requires mod - total A for ADLs and mod A for functional transfers.  Recommend CIR.       Follow Up Recommendations  CIR;Supervision/Assistance - 24 hour    Equipment Recommendations  3 in 1 bedside commode;Tub/shower bench    Recommendations for Other Services       Precautions / Restrictions Precautions Precautions: Fall Restrictions Weight Bearing Restrictions: No      Mobility Bed Mobility Overal bed mobility: Needs Assistance Bed Mobility: Supine to Sit;Sit to Supine     Supine to sit: Mod assist Sit to supine: Mod assist   General bed mobility comments: assist to move LEs off and on bed and to lift trunk   Transfers Overall transfer level: Needs assistance Equipment used: 1 person hand held assist Transfers: Sit to/from Stand;Stand Pivot Transfers Sit to Stand: Mod assist Stand pivot transfers: Mod assist       General transfer comment: Pt requires assist for anterior translation of trunk - he demonstrates posterior bias, and requires assist for balance.      Balance Overall balance assessment: Needs assistance Sitting-balance support: Feet supported Sitting balance-Leahy Scale: Poor Sitting balance - Comments: requires min - mod A in unsupported sitting EOB.  Min guard assist on BSC    Standing balance  support: Bilateral upper extremity supported Standing balance-Leahy Scale: Poor Standing balance comment: mod A                             ADL Overall ADL's : Needs assistance/impaired Eating/Feeding: Set up;Supervision/ safety;Bed level   Grooming: Wash/dry hands;Wash/dry face;Oral care;Brushing hair;Minimal assistance;Sitting;Bed level   Upper Body Bathing: Moderate assistance;Sitting;Bed level Upper Body Bathing Details (indicate cue type and reason): assist for Rt UE and chest  Lower Body Bathing: Total assistance;Sit to/from stand Lower Body Bathing Details (indicate cue type and reason): Able to bathe thighs, but requires assist for balance  Upper Body Dressing : Maximal assistance;Sitting   Lower Body Dressing: Total assistance;Sit to/from stand   Toilet Transfer: Moderate assistance;Stand-pivot;BSC Toilet Transfer Details (indicate cue type and reason): pt with posterior lean, and assist to guide hips and maintain balance  Toileting- Clothing Manipulation and Hygiene: Total assistance;Sit to/from stand       Functional mobility during ADLs: Moderate assistance       Vision Additional Comments: pt unable to participate in formal assessment due to cognitive deficits.  He demonstrates full EOMs.  He will look to targets presented on both Lt and Rt and will retrieve items placed on both Lt and Rt    Perception     Praxis      Pertinent Vitals/Pain Pain Assessment: Faces Faces Pain Scale: No hurt     Hand Dominance Right   Extremity/Trunk Assessment Upper Extremity Assessment Upper Extremity Assessment: RUE deficits/detail;LUE deficits/detail RUE Deficits / Details: Pt with mild  spasticity noted, but able to use Rt UE functionally during ADL tasks  LUE Deficits / Details: Pt with mild spasticity.  He is able to move through 3/4 ROM to touch his mouth.  He is able to elevate shoulder to ~100* shoulder flexion.  Movement moving out of synergy patterns.   Unable to reach with Lt hand as he has difficulty extending digits with UE in flexion  (tremor noted ) LUE Coordination: decreased fine motor;decreased gross motor   Lower Extremity Assessment Lower Extremity Assessment: Defer to PT evaluation   Cervical / Trunk Assessment Cervical / Trunk Assessment: Other exceptions Cervical / Trunk Exceptions: decreased trunk control    Communication Communication Communication: No difficulties   Cognition Arousal/Alertness: Awake/alert Behavior During Therapy: Flat affect Overall Cognitive Status: Within Functional Limits for tasks assessed Area of Impairment: Orientation;Attention;Memory;Following commands;Safety/judgement;Awareness;Problem solving Orientation Level: Disoriented to;Place;Time Current Attention Level: Sustained Memory: Decreased short-term memory Following Commands: Follows one step commands consistently Safety/Judgement: Decreased awareness of safety;Decreased awareness of deficits Awareness: Intellectual Problem Solving: Slow processing;Decreased initiation;Difficulty sequencing;Requires verbal cues;Requires tactile cues General Comments: Pt perseverates that he is on the back porch or in a kitchen - intermittently he is aware that he is in the hospital.  He requires mod cues for problem solving during simple ADL tasks    General Comments       Exercises       Shoulder Instructions      Home Living Family/patient expects to be discharged to:: Inpatient rehab Living Arrangements: Other relatives (sister)                                      Prior Functioning/Environment          Comments: Pt unable to provide info         OT Problem List: Decreased strength;Decreased activity tolerance;Impaired balance (sitting and/or standing);Decreased coordination;Decreased cognition;Decreased safety awareness;Decreased knowledge of use of DME or AE;Decreased knowledge of precautions;Impaired UE functional  use   OT Treatment/Interventions: Self-care/ADL training;Neuromuscular education;DME and/or AE instruction;Therapeutic activities;Cognitive remediation/compensation;Visual/perceptual remediation/compensation;Patient/family education;Balance training    OT Goals(Current goals can be found in the care plan section) Acute Rehab OT Goals Patient Stated Goal: "get out of here"  OT Goal Formulation: With patient Time For Goal Achievement: 04/13/16 Potential to Achieve Goals: Good ADL Goals Pt Will Perform Grooming: with supervision;with set-up;sitting Pt Will Perform Upper Body Bathing: with min assist;sitting Pt Will Perform Lower Body Bathing: with mod assist;sit to/from stand Pt Will Transfer to Toilet: with min assist;stand pivot transfer;bedside commode Pt Will Perform Toileting - Clothing Manipulation and hygiene: with min assist;sit to/from stand Additional ADL Goal #1: Pt will demonstrate selective attention during familiar ADL tasks with min cues   OT Frequency: Min 2X/week   Barriers to D/C: Decreased caregiver support          Co-evaluation              End of Session Equipment Utilized During Treatment: Gait belt Nurse Communication: Mobility status  Activity Tolerance: Patient tolerated treatment well Patient left: in bed;with call bell/phone within reach;with bed alarm set   Time: 9604-5409 OT Time Calculation (min): 31 min Charges:  OT General Charges $OT Visit: 1 Procedure OT Evaluation $OT Eval Moderate Complexity: 1 Procedure OT Treatments $Self Care/Home Management : 8-22 mins G-Codes:    Kimberlly Norgard M Apr 23, 2016, 4:18 PM

## 2016-03-30 NOTE — Progress Notes (Signed)
PULMONARY / CRITICAL CARE MEDICINE   Name: Randall Patel MRN: [id number] DOB: May 21, 1950    ADMISSION DATE:  03/23/2016   REFERRING MD:  EDP - [place]  CHIEF COMPLAINT:  PEA arrest  Brief:   65 yo AAM with hx of stroke x 2(last left hemorrhagic with rt hand residual weaknes) CAD with past MI, Etoh with 5th bourbon every 2 days, tobacco 1/2 ppd life long, HTN who was admitteD 12/12 to [facility] ed. He walked into sister's bedroom and co sob. Found in br with bloody sputum and unresponsive but breathing. EMS was activated and they [facility] but in route had PEA arrest and required Epi x 1 and intubation with #7 ett. Found to be acidotic ph 7.12, pco2 66, po2 47 in ed. Homero FellersFrank pulmonary edema on exam and cxr. Vent changes, diuresis, sedation change made with improved ventilation. He will need CT head to ro ICH prior to hypothermia protocol.  SUBJECTIVE:  No events overnight. This am remains hypertensive. Complaining of headache   VITAL SIGNS: BP (!) 180/100   Pulse 88   Temp 97.9 F (36.6 C) (Oral)   Resp (!) 23   Ht 5\' 6"  (1.676 m)   Wt 62.6 kg (138 lb 0.1 oz)   SpO2 99%   BMI 22.28 kg/m   VENTILATOR SETTINGS:    INTAKE / OUTPUT: I/O last 3 completed shifts: In: 3669.2 [P.O.:1097; I.V.:2572.2] Out: 1650 [Urine:1650]  PHYSICAL EXAMINATION: General:  Adult male, lying in be Neuro: Alert, follows commands, slow responses  HEENT:  normocephalic. Cardiovascular:  No MRG, RRR, s1/s2 Lungs: Diminished breath sounds to bases, unlabored, non-productive cough  Abdomen:  Soft, active bowel sounds  Musculoskeletal:  No deformities  Skin:  Warm and dry, intact   LABS:  BMET  Recent Labs Lab 03/28/16 0340 03/29/16 0330 03/30/16 0303  NA 142 137 140  K 3.6 3.4* 3.5  CL 110 104 105  CO2 24 21* 21*  BUN 14 13 18   CREATININE 0.76 0.77 0.71  GLUCOSE 126* 118* 123*   Electrolytes  Recent Labs Lab 03/28/16 0340 03/29/16 0330 03/29/16 1157 03/30/16 0303  CALCIUM  9.0 10.0  --  10.1  MG 1.9  --  2.6* 2.2  PHOS 3.1  --  3.6 3.7    CBC  Recent Labs Lab 03/28/16 0340 03/29/16 0408 03/30/16 0303  WBC 6.5 10.2 11.0*  HGB 10.5* 13.4 13.9  HCT 32.7* 40.8 41.3  PLT 163 224 302    Coag's No results for input(s): APTT, INR in the last 168 hours.  Sepsis Markers  Recent Labs Lab 03/23/16 1400 03/23/16 1700 03/24/16 0300 03/25/16 0325  LATICACIDVEN 1.8 1.7  --   --   PROCALCITON  --   --  3.93 1.67    ABG  Recent Labs Lab 03/25/16 0340 03/26/16 0410 03/27/16 0430  PHART 7.327* 7.460* 7.392  PCO2ART 44.0 35.2 41.1  PO2ART 88.7 115* 96.0    Liver Enzymes  Recent Labs Lab 03/24/16 1002  AST 20  ALT 16*  ALKPHOS 29*  BILITOT 1.2  ALBUMIN 2.7*    Cardiac Enzymes  Recent Labs Lab 03/23/16 2230 03/24/16 1002 03/24/16 1515  TROPONINI 0.24* 0.21* 0.21*    Glucose  Recent Labs Lab 03/29/16 1153 03/29/16 1622 03/29/16 2016 03/29/16 2333 03/30/16 0324 03/30/16 0834  GLUCAP 119* 157* 108* 112* 122* 122*    Imaging Ct Head Wo Contrast  Result Date: 03/29/2016 CLINICAL DATA:  65 y/o  M; status post cardiac arrest  with headache. EXAM: CT HEAD WITHOUT CONTRAST TECHNIQUE: Contiguous axial images were obtained from the base of the skull through the vertex without intravenous contrast. COMPARISON:  03/24/2016 MRI of the brain. 03/23/2016 CT of the head. FINDINGS: Brain: Stable subacute hematoma within the right frontal lobe. Stable chronic right anterior superior and right lateral frontal infarcts. Stable chronic lacunar infarct in the right basal ganglia. No evidence for new large acute territory infarct, focal mass effect, or intracranial hemorrhage. No hydrocephalus or extra-axial collection. Stable ex vacuo dilatation of frontal horn of right lateral ventricle. Mild brain parenchymal volume loss. Vascular: No hyperdense vessel. Calcific atherosclerosis of cavernous internal carotid arteries. Skull: Normal. Negative for  fracture or focal lesion. Sinuses/Orbits: Mild mucosal thickening within sphenoid and maxillary sinuses. Normal pneumatization of mastoid air cells. Opacification and external auditory canals is likely cerumen. Normal orbits. Other: None. IMPRESSION: 1. No acute intracranial abnormality is identified. 2. Stable subacute hematoma within the right frontal lobe. 3. Stable right frontal lobe and right basal ganglia chronic infarcts. 4. Mild paranasal sinus disease. Electronically Signed   By: Mitzi Hansen M.D.   On: 03/29/2016 15:00    STUDIES:  12/12 ct head > Hemorrhagic infarct in right frontal lobe 12/12 MRI Head > Subacute hematoma in right posterior frontal lobe measuring 31 cm  12/12 EEG > Diffuse Slowing   CULTURES: 12/12 bc x 2>>neg 12/12 sputum>>neg  ANTIBIOTICS: Vancomycin 12/13 > 12/16 Zosyn 12/13 >12/14 Levaquin 12/15 >>  SIGNIFICANT EVENTS: 12/12 pea arrest  LINES/TUBES: 12/12 et >> 12/16 12/12 l ij cvl>>  DISCUSSION: 65 yo AAM with hx of stroke x 2(last left hemorrhagic with rt hand residual weaknes) CAD with past MI, Etoh with 5th bourbon every 2 days, tobacco 1/2 ppd life long, HTN who was admitteD 12/12 to [facility] ed. He walked into sister's bedroom and co sob. Found in br with bloody sputum and unresponsive but breathing. EMS was activated and they transported to [facility] ED but in route had PEA arrest and required Epi x 1 and intubation with #7 ett. Found to be acidotic ph 7.12, pco2 66, po2 47 in ed. [name] pulmonary edema on exam and cxr. Vent changes, diuresis, sedation change made with improved ventilation. He will need CT head to ro ICH prior to hypothermia protocol.  ASSESSMENT / PLAN:  PULMONARY A: Resp failure with pulmonary edema Hemoptysis COPD with tobacco abuse Profound hypoxia and hypercarbia  P:   Supplemental oxygen for goal > 92 Mobilize  IS  BDs  CARDIOVASCULAR A:  Hx of MI HTN Heavy ETOH/Tobacco use -Echo EF of 35% P:  Cardiac  Monitoring  Continue Norvasc 10 mg Daily  Continue Metoprolol 100 mg BID  Add Spironolactone 25 mg daily per Cards  PRN metoprolol for systolic >160   RENAL A:   Acute renal insuff (Baseline Crt 1.2) -Improving  Hypokalemia P:   Trend BMP Replace electrolytes as needed  Replace K  D/C NS  GASTROINTESTINAL A:   Dysphagia P:   Dys Diet 2  Continue to follow with Speech   HEMATOLOGIC A:   No acute issue P:  Trend CBC   INFECTIOUS A:   ?Aspiration Event during intubation  P:   Continue Levaquin  Trend WBC and Fever Curve   ENDOCRINE A:   Hyperglycemia P:   SSI  NEUROLOGIC A:   Frontal Lobe ICH >> neuro s/o Hx of 2 strokes last 2 years with rt hand weakness H/O ETOH, Seizures  P:   Prn ativan Add  PRN Norco and Tylenol  Thiamine, folic acid, multivitamin   Jovita Kussmaul, AG-ACNP [facility]  Pgr: [phone]  PCCM Pgr: [phone]

## 2016-03-30 NOTE — Consult Note (Signed)
Physical Medicine and Rehabilitation Consult   Reason for Consult:  Anoxic BI Referring Physician: Dr.    Sula Rumple: Randall Patel is a 65 y.o. 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 EMS found patient unresponsive on the ground with blood sputum but with pulse. He went cardiac arrest--PEA treated with CPR x 6 minutes and epinephrine with ROSC. He was acidotic at admission and intubated and started on IV antibiotics. UDS positive for benzos and THC.  MRI brain revealed subacute hematoma right frontal lobe--31 mm. Cardiology consulted and elevated cardiac enzymes felt to be due to demand ischemia in setting of cardiac arrest.  2  D echo with EF 45-50% with hypokinesis of inferior and inferoseptal myocardium and moderately increased pulmonary pressures. EEG with suggestion of global anoxic injury and felt that patient with asymptomatic ICH with incidental old finding from prior hemorrhage 03/2015. Right posterior frontal hematoma with old right anterior frontal infarct and to continue ASA. Encephalopathy felt ot be due to ABI. He has had issues with agitation requiring precedex and pulmonary edema treated with diuresis. Significant agitation with tachycardia treated with BB and elevated HTN being managed with titration of meds.  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        Review of Systems  HENT: Negative for hearing loss and tinnitus.   Eyes: Negative for blurred vision and double vision.  Respiratory: Negative for cough and shortness of breath.   Cardiovascular: Negative for chest pain and palpitations.  Gastrointestinal: Negative for abdominal pain, constipation and heartburn.  Genitourinary: Negative for frequency and urgency.  Musculoskeletal: Negative for back pain  and joint pain.  Skin: Negative for itching and rash.  Neurological: Positive for focal weakness and weakness.       Uncontrolled spasms and grunts  All other systems reviewed and are negative.  Past Medical History:  Diagnosis Date  . CAD (coronary artery disease)    a. prior MI in 2006 b. 03/2012: 30% ostial LM stenosis, 30% pLAD stenosis, 60% mid-LADm 30% pCx, 3rd OM which was small in size with diffuse 90% stenosis involving both branches, and RCA with 50% mid-stenosis and 100% distal occlusion. POBA to PL branch and DES placement to dRCA.  [name] History of stroke   . Ischemic cardiomyopathy    a. EF 40-45% by echo in 03/2015   History reviewed. No pertinent surgical history. related to brain  Family History  Problem Relation Age of Onset  . Heart attack Father   . Hypertension Father    Social History:  Lives with sister (disabled and helps provide supervision).  Was independent without AD prior to admission. He reports that he has been smoking --1 PPD.  He has a 40.00 pack-year smoking history. He has never used smokeless tobacco. He reports that he drinks alcohol--6-12 pack beer and liquor "when he can get it".  Per reports that he uses drugs, including Marijuana.   Allergies  Allergen Reactions  . Lisinopril Swelling    This allergy is noted from previous electronic record; however, pt. Is unable to confirm this allergy at this time  . Other     Son states he is allergic to an antibiotics  Not sur which one. Also pharmacy has no records of any allergies     Medications Prior  to Admission  Medication Sig Dispense Refill  . aspirin 81 MG chewable tablet Chew by mouth daily.      Home: Home Living Family/patient expects to be discharged to:: Unsure  Functional History: Prior Function Comments: Poor historian; Unable to discern prior level of function; he was able to identify that his L side was effected by the CVA Functional Status:  Mobility: Bed Mobility Overal bed  mobility: Needs Assistance Bed Mobility: Supine to Sit, Sit to Supine Supine to sit: Mod assist, +2 for safety/equipment Sit to supine: Mod assist, +2 for safety/equipment General bed mobility comments: Cues for technique and assist to elevate trunk into sitting and bring hips to EOB Transfers Overall transfer level: Needs assistance Equipment used: Rolling walker (2 wheeled) Transfers: Sit to/from Stand, Stand Pivot Transfers Sit to Stand: +2 safety/equipment, Min assist Stand pivot transfers: +2 safety/equipment, Min assist General transfer comment: Assist to bring hips up and for balance. Manual facilitation to place hands on walker.  Pt with hands on walker for pivot but not really using walker for support due to poor grip  Ambulation/Gait Ambulation/Gait assistance: Min assist, +2 safety/equipment Ambulation Distance (Feet): 10 Feet Assistive device: Rolling walker (2 wheeled) Gait Pattern/deviations: Step-through pattern, Decreased step length - right, Decreased step length - left, Drifts right/left, Trunk flexed General Gait Details: Assist for balance and support. Assist to move and guide walker with pt resting hands on walker after placed there but very little support from walker. Gait velocity: decr Gait velocity interpretation: Below normal speed for age/gender    ADL:    Cognition: Cognition Overall Cognitive Status: No family/caregiver present to determine baseline cognitive functioning Orientation Level: Oriented X4 Cognition Arousal/Alertness: Awake/alert Behavior During Therapy: Flat affect Overall Cognitive Status: No family/caregiver present to determine baseline cognitive functioning Area of Impairment: Orientation, Attention, Memory, Safety/judgement, Problem solving Orientation Level: Disoriented to, Time, Situation Current Attention Level: Sustained Memory: Decreased short-term memory Safety/Judgement: Decreased awareness of safety, Decreased awareness of  deficits Problem Solving: Slow processing, Decreased initiation, Requires verbal cues, Requires tactile cues General Comments: Pt seems to have inattention to rt and lt upper extremity  Blood pressure (!) 180/100, pulse 88, temperature 97.9 F (36.6 C), temperature source Oral, resp. rate (!) 23, height 5\' 6"  (1.676 m), weight 62.6 kg (138 lb 0.1 oz), SpO2 99 %. Physical Exam  Nursing note and vitals reviewed. Constitutional: He is oriented to person, place, and time. He appears well-developed and well-nourished.  HENT:  Head: Normocephalic and atraumatic.  Mouth/Throat: Oropharynx is clear and moist.  Eyes: EOM are normal. Pupils are equal, round, and reactive to light.  Neck: Normal range of motion. Neck supple.  Cardiovascular: Normal rate and regular rhythm.   Respiratory: Effort normal and breath sounds normal. No stridor. No respiratory distress. He has no wheezes.  GI: Soft. Bowel sounds are normal. He exhibits no distension. There is no tenderness.  Musculoskeletal: He exhibits no edema or tenderness.  Neurological: He is alert and oriented to person, place, and time.  Flat affect.  Mild left facial droop.  Speech clear and able to follow basic motor commands without difficulty.   Motor: LUE: 4-/5 proxima to distal with apraxia RUE: 4/5 proximal to distal LLE: 4/5 proximal to distal RLE: 4+/5 proximal to distal DTRs symmetric, ?>Right side  Skin: Skin is warm and dry. No rash noted. He is not diaphoretic. No erythema.  Psychiatric: His affect is blunt. His speech is delayed. He is slowed.    Results for orders  placed or performed during the hospital encounter of 03/23/16 (from the past 24 hour(s))  Glucose, capillary     Status: Abnormal   Collection Time: 03/29/16 11:53 AM  Result Value Ref Range   Glucose-Capillary 119 (H) 65 - 99 mg/dL  Magnesium     Status: Abnormal   Collection Time: 03/29/16 11:57 AM  Result Value Ref Range   Magnesium 2.6 (H) 1.7 - 2.4 mg/dL    Phosphorus     Status: None   Collection Time: 03/29/16 11:57 AM  Result Value Ref Range   Phosphorus 3.6 2.5 - 4.6 mg/dL  Glucose, capillary     Status: Abnormal   Collection Time: 03/29/16  4:22 PM  Result Value Ref Range   Glucose-Capillary 157 (H) 65 - 99 mg/dL  Glucose, capillary     Status: Abnormal   Collection Time: 03/29/16  8:16 PM  Result Value Ref Range   Glucose-Capillary 108 (H) 65 - 99 mg/dL  Glucose, capillary     Status: Abnormal   Collection Time: 03/29/16 11:33 PM  Result Value Ref Range   Glucose-Capillary 112 (H) 65 - 99 mg/dL  Basic metabolic panel     Status: Abnormal   Collection Time: 03/30/16  3:03 AM  Result Value Ref Range   Sodium 140 135 - 145 mmol/L   Potassium 3.5 3.5 - 5.1 mmol/L   Chloride 105 101 - 111 mmol/L   CO2 21 (L) 22 - 32 mmol/L   Glucose, Bld 123 (H) 65 - 99 mg/dL   BUN 18 6 - 20 mg/dL   Creatinine, Ser 1.61 0.61 - 1.24 mg/dL   Calcium 09.6 8.9 - 04.5 mg/dL   GFR calc non Af Amer >60 >60 mL/min   GFR calc Af Amer >60 >60 mL/min   Anion gap 14 5 - 15  CBC     Status: Abnormal   Collection Time: 03/30/16  3:03 AM  Result Value Ref Range   WBC 11.0 (H) 4.0 - 10.5 K/uL   RBC 5.02 4.22 - 5.81 MIL/uL   Hemoglobin 13.9 13.0 - 17.0 g/dL   HCT 40.9 81.1 - 91.4 %   MCV 82.3 78.0 - 100.0 fL   MCH 27.7 26.0 - 34.0 pg   MCHC 33.7 30.0 - 36.0 g/dL   RDW 78.2 95.6 - 21.3 %   Platelets 302 150 - 400 K/uL  Magnesium     Status: None   Collection Time: 03/30/16  3:03 AM  Result Value Ref Range   Magnesium 2.2 1.7 - 2.4 mg/dL  Phosphorus     Status: None   Collection Time: 03/30/16  3:03 AM  Result Value Ref Range   Phosphorus 3.7 2.5 - 4.6 mg/dL  Glucose, capillary     Status: Abnormal   Collection Time: 03/30/16  3:24 AM  Result Value Ref Range   Glucose-Capillary 122 (H) 65 - 99 mg/dL  Glucose, capillary     Status: Abnormal   Collection Time: 03/30/16  8:34 AM  Result Value Ref Range   Glucose-Capillary 122 (H) 65 - 99 mg/dL    Ct Head Wo Contrast  Result Date: 03/29/2016 CLINICAL DATA:  65 y/o  M; status post cardiac arrest with headache. EXAM: CT HEAD WITHOUT CONTRAST TECHNIQUE: Contiguous axial images were obtained from the base of the skull through the vertex without intravenous contrast. COMPARISON:  03/24/2016 MRI of the brain. 03/23/2016 CT of the head. FINDINGS: Brain: Stable subacute hematoma within the right frontal lobe. Stable chronic right anterior  superior and right lateral frontal infarcts. Stable chronic lacunar infarct in the right basal ganglia. No evidence for new large acute territory infarct, focal mass effect, or intracranial hemorrhage. No hydrocephalus or extra-axial collection. Stable ex vacuo dilatation of frontal horn of right lateral ventricle. Mild brain parenchymal volume loss. Vascular: No hyperdense vessel. Calcific atherosclerosis of cavernous internal carotid arteries. Skull: Normal. Negative for fracture or focal lesion. Sinuses/Orbits: Mild mucosal thickening within sphenoid and maxillary sinuses. Normal pneumatization of mastoid air cells. Opacification and external auditory canals is likely cerumen. Normal orbits. Other: None. IMPRESSION: 1. No acute intracranial abnormality is identified. 2. Stable subacute hematoma within the right frontal lobe. 3. Stable right frontal lobe and right basal ganglia chronic infarcts. 4. Mild paranasal sinus disease. Electronically Signed   By: Mitzi Hansen M.D.   On: 03/29/2016 15:00    Assessment/Plan: Diagnosis: Anoxic BI Labs and images independently reviewed.  Records reviewed and summated above.  NeuroPsych evaluation for behavorial assessment.  Provide environmental management by reducing the level of stimulation, tolerating restlessness when possible, protecting patient from harming self or others and reducing patient's cognitive confusion.  Address behavioral concerns include providing structured environments and daily  routines.  Cognitive therapy to direct modular abilities in order to maintain goals  including problem solving, self regulation/monitoring, self management, attention, and memory.  Fall precautions; pt at risk for second impact syndrome  Prevention of secondary injury: monitor for hypotension, hypoxia, seizures or signs of increased ICP  Prophylactic AED:   Avoid medications that could impair cognitive abilities, such as anticholinergics, antihistaminic, benzodiazapines, narcotics, etc when possible  1. Does the need for close, 24 hr/day medical supervision in concert with the patient's rehab needs make it unreasonable for this patient to be served in a less intensive setting? Yes  2. 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 (CIWA, cont monitor), history of hemorrhagic CVA with residual deficits, polysubstance abuse (counsel when appropriate), tachypnea (monitor RR and O2 Sats with increased physical exertion, HTN (monitor and provide prns in accordance with increased physical exertion and pain), hyperglycemia (cont to monitor, consider meds if necessary), pain (Biofeedback training with therapies to help reduce reliance on opiate pain medications, particularly IV fentanly, monitor pain control during therapies, and sedation at rest and titrate to maximum efficacy to ensure participation and gains in therapies), leukocytosis (cont to monitor for signs and symptoms of infection, further workup if indicated) 3. Due to bladder management, bowel management, safety, disease management, medication administration, pain management and patient education, does the patient require 24 hr/day rehab nursing? Yes 4. Does the patient require coordinated care of a physician, rehab nurse, PT (1-2 hrs/day, 5 days/week), OT (1-2 hrs/day, 5 days/week) and SLP (1-2 hrs/day, 5 days/week) to address physical and functional  deficits in the context of the above medical diagnosis(es)? Yes Addressing deficits in the following areas: balance, endurance, locomotion, strength, transferring, bowel/bladder control, bathing, dressing, feeding, grooming, toileting, cognition, speech, swallowing and psychosocial support 5. Can the patient actively participate in an intensive therapy program of at least 3 hrs of therapy per day at least 5 days per week? Potentially 6. The potential for patient to make measurable gains while on inpatient rehab is excellent 7. Anticipated functional outcomes upon discharge from inpatient rehab are modified independent and supervision  with PT, modified independent and supervision with OT, supervision and min assist with SLP. 8. Estimated rehab length of stay to reach the above functional goals  is: 15-18 days. 9. Does the patient have adequate social supports and living environment to accommodate these discharge functional goals? Yes 10. Anticipated D/C setting: Home 11. Anticipated post D/C treatments: HH therapy and Home excercise program 12. Overall Rehab/Functional Prognosis: fair  RECOMMENDATIONS: This patient's condition is appropriate for continued rehabilitative care in the following setting: CIR when pt able to tolerate. Patient has agreed to participate in recommended program. Potentially Note that insurance prior authorization may be required for reimbursement for recommended care.  Comment: Rehab Admissions Coordinator to follow up.  Jerene PitchLove, Pamela S, PA-C 03/30/2016  Maryla MorrowAnkit Emberley Kral, MD, [place] DomFAAPMR

## 2016-03-30 NOTE — Progress Notes (Signed)
Patient Name: Randall Patel Date of Encounter: 03/30/2016  Primary Cardiologist: Dr. Geronimo Boot Problem List     Active Problems:   Cardiac arrest [facility])   Acute pulmonary edema (HCC)   Respiratory failure (HCC)   Sinus tachycardia   Patient Profile     65 year old with cardiorespiratory arrest (PEA) with known CAD DES to dRCA, prior EF 45%, mildly elevated flat troponin, acute respiratory failure, ETOH heavy use, subacute hematoma of right posterior frontal lobe. Hx of prior CVA 12/17 Echo EF 45-50%    Subjective   Complains of a headache  Inpatient Medications    Scheduled Meds: . amLODipine  10 mg Oral Daily  . folic acid  1 mg Oral Daily  . insulin aspart  0-9 Units Subcutaneous Q4H  . magnesium sulfate 1 - 4 g bolus IVPB  2 g Intravenous Once  . mouth rinse  15 mL Mouth Rinse BID  . metoprolol tartrate  100 mg Oral BID  . multivitamin with minerals  1 tablet Oral Daily  . sodium chloride flush  10-40 mL Intracatheter Q12H  . thiamine  100 mg Oral Daily   Continuous Infusions: . sodium chloride Stopped (03/29/16 2100)   PRN Meds: acetaminophen, fentaNYL (SUBLIMAZE) injection, guaiFENesin, hydrALAZINE, HYDROcodone-acetaminophen, Influenza vac split quadrivalent PF, ipratropium-albuterol, LORazepam, metoprolol, ondansetron (ZOFRAN) IV, pneumococcal 23 valent vaccine, sodium chloride flush   Vital Signs    Vitals:   03/30/16 0325 03/30/16 0400 03/30/16 0500 03/30/16 0600  BP:  (!) 158/88 (!) 170/76 (!) 185/124  Pulse:  100 (!) 112 (!) 121  Resp:  19 (!) 25 (!) 42  Temp:  98.7 F (37.1 C)    TempSrc:  Oral    SpO2:  98% 96% 99%  Weight: 62.6 kg (138 lb 0.1 oz)     Height:        Intake/Output Summary (Last 24 hours) at 03/30/16 0827 Last data filed at 03/30/16 0600  Gross per 24 hour  Intake             2807 ml  Output              325 ml  Net             2482 ml   Filed Weights   03/28/16 0600 03/29/16 0600 03/30/16 0325  Weight: 64.9 kg  (143 lb 1.6 oz) 62.8 kg (138 lb 7.2 oz) 62.6 kg (138 lb 0.1 oz)    Physical Exam    GEN: Sedate thin,ill appearing.somnolent   HEENT: Grossly normal.  Neck: Supple, no JVD, carotid bruits, or masses. Cardiac: Tachy RR, no murmurs, rubs, or gallops. No clubbing, cyanosis, edema.  Radials/DP/PT 2+ and equal bilaterally.  Respiratory:  Respirations regular and unlabored, clear to auscultation bilaterally. GI: Soft, nontender, nondistended, BS + x 4. MS: no deformity or atrophy. Skin: warm and dry, no rash. Neuro:  Strength and sensation are intact. Psych: more  responsive   Labs    CBC  Recent Labs  03/29/16 0408 03/30/16 0303  WBC 10.2 11.0*  HGB 13.4 13.9  HCT 40.8 41.3  MCV 84.5 82.3  PLT 224 302   Basic Metabolic Panel  Recent Labs  03/29/16 0330 03/29/16 1157 03/30/16 0303  NA 137  --  140  K 3.4*  --  3.5  CL 104  --  105  CO2 21*  --  21*  GLUCOSE 118*  --  123*  BUN 13  --  18  CREATININE 0.77  --  0.71  CALCIUM 10.0  --  10.1  MG  --  2.6* 2.2  PHOS  --  3.6 3.7   Liver Function Tests No results for input(s): AST, ALT, ALKPHOS, BILITOT, PROT, ALBUMIN in the last 72 hours. No results for input(s): LIPASE, AMYLASE in the last 72 hours. Cardiac Enzymes No results for input(s): CKTOTAL, CKMB, CKMBINDEX, TROPONINI in the last 72 hours. BNP Invalid input(s): POCBNP D-Dimer No results for input(s): DDIMER in the last 72 hours. Hemoglobin A1C No results for input(s): HGBA1C in the last 72 hours. Fasting Lipid Panel No results for input(s): CHOL, HDL, LDLCALC, TRIG, CHOLHDL, LDLDIRECT in the last 72 hours. Thyroid Function Tests No results for input(s): TSH, T4TOTAL, T3FREE, THYROIDAB in the last 72 hours.  Invalid input(s): FREET3  Telemetry    Telemetry Personally reviewed :  NSR at 76  ECG     n/a   Radiology    Ct Head Wo Contrast  Result Date: 03/29/2016 CLINICAL DATA:  65 y/o  M; status post cardiac arrest with headache. EXAM: CT HEAD  WITHOUT CONTRAST TECHNIQUE: Contiguous axial images were obtained from the base of the skull through the vertex without intravenous contrast. COMPARISON:  03/24/2016 MRI of the brain. 03/23/2016 CT of the head. FINDINGS: Brain: Stable subacute hematoma within the right frontal lobe. Stable chronic right anterior superior and right lateral frontal infarcts. Stable chronic lacunar infarct in the right basal ganglia. No evidence for new large acute territory infarct, focal mass effect, or intracranial hemorrhage. No hydrocephalus or extra-axial collection. Stable ex vacuo dilatation of frontal horn of right lateral ventricle. Mild brain parenchymal volume loss. Vascular: No hyperdense vessel. Calcific atherosclerosis of cavernous internal carotid arteries. Skull: Normal. Negative for fracture or focal lesion. Sinuses/Orbits: Mild mucosal thickening within sphenoid and maxillary sinuses. Normal pneumatization of mastoid air cells. Opacification and external auditory canals is likely cerumen. Normal orbits. Other: None. IMPRESSION: 1. No acute intracranial abnormality is identified. 2. Stable subacute hematoma within the right frontal lobe. 3. Stable right frontal lobe and right basal ganglia chronic infarcts. 4. Mild paranasal sinus disease. Electronically Signed   By: Mitzi Hansen M.D.   On: 03/29/2016 15:00    Cardiac Studies   Cath 2013  1. NSTEMI 2. Triple vessel CAD with occluded RCA, culprit vessel. 3. Successful PTCA/DES x 1 distal RCA 4. Successful PTCA with balloon angioplasty only of the posterolateral branch.  5. Segmental LV systolic dysfunction   Assessment & Plan    1. Cardiac Arrest - initially reported acute dyspnea but arrested upon EMS arrival. Initially in PEA then Asystole.  presemably this was a primary resp event   2. CAD - cath in 03/2012 showed 30% ostial LM stenosis, 30% pLAD stenosis, 60% mid-LADm 30% pCx, 3rd OM which was small in size with diffuse 90% stenosis  involving both branches, and RCA with 50% mid-stenosis and 100% distal occlusion. This was treated with DES x1 to the dRCA with successful PTCA with balloon angioplasty only of the posterolateral branch being perfomed.   Cath ( intervention) precluded 2/2 hematoma - Will resume BB - metop 5mg  IV Q6 - restart ACE-I, and statin as he awakens   3. Nonischemic Cardiomyopathy - EF 40-45% by echocardiogram in 03/2015.  On metoprolol Starting Aldactone today  Will consider ARB tomorrow   4. Elevated Troponin - cyclic troponin values at 0.04, 0.13, 0.21, 0.24, and 0.21. Likely secondary to demand ischemia in the setting of his cardiac arrest.  5. Respiratory Failure with Pulmonary Edema and Hemoptysis  Per CCM    6. Subacute Hematoma -complains of a head ache .   MRI of brain this admission shows subacute hematoma in the right posterior frontal lobe measuring up to 31 mm.Per Neuro notes not felt to be related to acute trauma or presentation with encephalopathy - Transitioning to po meds.   BP is improving  - will add Aldactone today ,  Consider ARB tomorrow  Continue daily BMP checks OK to transfer to Hospitalist service / tele bed from our standpoint   Signed, Kristeen MissPhilip Nahser, MD  03/30/2016, 8:27 AM

## 2016-03-31 ENCOUNTER — Encounter (HOSPITAL_COMMUNITY): Payer: Self-pay

## 2016-03-31 ENCOUNTER — Encounter (HOSPITAL_COMMUNITY): Payer: Self-pay | Admitting: Physical Medicine and Rehabilitation

## 2016-03-31 ENCOUNTER — Inpatient Hospital Stay (HOSPITAL_COMMUNITY)
Admission: RE | Admit: 2016-03-31 | Discharge: 2016-04-14 | DRG: 948 | Disposition: A | Discharge status: Home Health | Payer: Medicare HMO | Source: Intra-hospital | Attending: Physical Medicine & Rehabilitation | Admitting: Physical Medicine & Rehabilitation

## 2016-03-31 DIAGNOSIS — E440 Moderate protein-calorie malnutrition: Secondary | ICD-10-CM | POA: Diagnosis not present

## 2016-03-31 DIAGNOSIS — I69354 Hemiplegia and hemiparesis following cerebral infarction affecting left non-dominant side: Secondary | ICD-10-CM | POA: Diagnosis not present

## 2016-03-31 DIAGNOSIS — Z888 Allergy status to other drugs, medicaments and biological substances status: Secondary | ICD-10-CM | POA: Diagnosis not present

## 2016-03-31 DIAGNOSIS — Z7982 Long term (current) use of aspirin: Secondary | ICD-10-CM | POA: Diagnosis not present

## 2016-03-31 DIAGNOSIS — I629 Nontraumatic intracranial hemorrhage, unspecified: Secondary | ICD-10-CM

## 2016-03-31 DIAGNOSIS — R1312 Dysphagia, oropharyngeal phase: Secondary | ICD-10-CM | POA: Diagnosis present

## 2016-03-31 DIAGNOSIS — G931 Anoxic brain damage, not elsewhere classified: Secondary | ICD-10-CM | POA: Diagnosis present

## 2016-03-31 DIAGNOSIS — I469 Cardiac arrest, cause unspecified: Secondary | ICD-10-CM | POA: Diagnosis not present

## 2016-03-31 DIAGNOSIS — R531 Weakness: Secondary | ICD-10-CM | POA: Diagnosis present

## 2016-03-31 DIAGNOSIS — F17210 Nicotine dependence, cigarettes, uncomplicated: Secondary | ICD-10-CM | POA: Diagnosis present

## 2016-03-31 DIAGNOSIS — F10230 Alcohol dependence with withdrawal, uncomplicated: Secondary | ICD-10-CM | POA: Diagnosis not present

## 2016-03-31 DIAGNOSIS — I2510 Atherosclerotic heart disease of native coronary artery without angina pectoris: Secondary | ICD-10-CM | POA: Diagnosis present

## 2016-03-31 DIAGNOSIS — F10239 Alcohol dependence with withdrawal, unspecified: Secondary | ICD-10-CM | POA: Diagnosis present

## 2016-03-31 DIAGNOSIS — I255 Ischemic cardiomyopathy: Secondary | ICD-10-CM | POA: Diagnosis present

## 2016-03-31 DIAGNOSIS — Z955 Presence of coronary angioplasty implant and graft: Secondary | ICD-10-CM | POA: Diagnosis not present

## 2016-03-31 DIAGNOSIS — Z8674 Personal history of sudden cardiac arrest: Secondary | ICD-10-CM

## 2016-03-31 DIAGNOSIS — I6930 Unspecified sequelae of cerebral infarction: Secondary | ICD-10-CM | POA: Diagnosis not present

## 2016-03-31 DIAGNOSIS — I69391 Dysphagia following cerebral infarction: Secondary | ICD-10-CM

## 2016-03-31 DIAGNOSIS — D72829 Elevated white blood cell count, unspecified: Secondary | ICD-10-CM | POA: Diagnosis present

## 2016-03-31 DIAGNOSIS — E876 Hypokalemia: Secondary | ICD-10-CM | POA: Diagnosis not present

## 2016-03-31 DIAGNOSIS — I10 Essential (primary) hypertension: Secondary | ICD-10-CM | POA: Diagnosis present

## 2016-03-31 DIAGNOSIS — I119 Hypertensive heart disease without heart failure: Secondary | ICD-10-CM

## 2016-03-31 DIAGNOSIS — J449 Chronic obstructive pulmonary disease, unspecified: Secondary | ICD-10-CM | POA: Diagnosis present

## 2016-03-31 DIAGNOSIS — I252 Old myocardial infarction: Secondary | ICD-10-CM | POA: Diagnosis not present

## 2016-03-31 MED ORDER — VITAMIN B-1 100 MG PO TABS
100.0000 mg | ORAL_TABLET | Freq: Every day | ORAL | Status: DC
  Administered 2016-04-01 – 2016-04-14 (×14): 100 mg via ORAL
  Filled 2016-03-31 (×14): qty 1

## 2016-03-31 MED ORDER — THIAMINE HCL 100 MG PO TABS: 100.0000 mg | ORAL_TABLET | Freq: Every day | ORAL | Status: DC

## 2016-03-31 MED ORDER — ADULT MULTIVITAMIN W/MINERALS CH
1.0000 | ORAL_TABLET | Freq: Every day | ORAL | Status: DC
  Administered 2016-04-01 – 2016-04-11 (×10): 1 via ORAL
  Filled 2016-03-31 (×11): qty 1

## 2016-03-31 MED ORDER — AMLODIPINE BESYLATE 10 MG PO TABS
10.0000 mg | ORAL_TABLET | Freq: Every day | ORAL | Status: DC
  Administered 2016-04-01 – 2016-04-14 (×14): 10 mg via ORAL
  Filled 2016-03-31 (×14): qty 1

## 2016-03-31 MED ORDER — SPIRONOLACTONE 25 MG PO TABS: 25.0000 mg | ORAL_TABLET | Freq: Every day | ORAL | Status: DC

## 2016-03-31 MED ORDER — ATORVASTATIN CALCIUM 80 MG PO TABS: 80.0000 mg | ORAL_TABLET | Freq: Every day | ORAL | Status: DC

## 2016-03-31 MED ORDER — ALUM & MAG HYDROXIDE-SIMETH 200-200-20 MG/5ML PO SUSP: 30.0000 mL | ORAL | Status: DC | PRN

## 2016-03-31 MED ORDER — METOPROLOL TARTRATE 100 MG PO TABS: 100.0000 mg | ORAL_TABLET | Freq: Two times a day (BID) | ORAL | Status: DC

## 2016-03-31 MED ORDER — FLEET ENEMA 7-19 GM/118ML RE ENEM: 1.0000 | ENEMA | Freq: Once | RECTAL | Status: DC | PRN

## 2016-03-31 MED ORDER — NITROGLYCERIN 0.4 MG SL SUBL: 0.4000 mg | SUBLINGUAL_TABLET | SUBLINGUAL | Status: DC | PRN

## 2016-03-31 MED ORDER — HYDRALAZINE HCL 50 MG PO TABS
50.0000 mg | ORAL_TABLET | Freq: Three times a day (TID) | ORAL | Status: DC
  Administered 2016-03-31 – 2016-04-03 (×8): 50 mg via ORAL
  Filled 2016-03-31 (×8): qty 1

## 2016-03-31 MED ORDER — BOOST / RESOURCE BREEZE PO LIQD: 1.0000 | Freq: Three times a day (TID) | ORAL | 0 refills | Status: DC

## 2016-03-31 MED ORDER — ADULT MULTIVITAMIN W/MINERALS CH: 1.0000 | ORAL_TABLET | Freq: Every day | ORAL | Status: DC

## 2016-03-31 MED ORDER — FLUTICASONE PROPIONATE 50 MCG/ACT NA SUSP: 2.0000 | Freq: Every day | NASAL | 2 refills | Status: DC

## 2016-03-31 MED ORDER — PROCHLORPERAZINE MALEATE 5 MG PO TABS: 5.0000 mg | ORAL_TABLET | Freq: Four times a day (QID) | ORAL | Status: DC | PRN

## 2016-03-31 MED ORDER — FOLIC ACID 1 MG PO TABS
1.0000 mg | ORAL_TABLET | Freq: Every day | ORAL | Status: DC
  Administered 2016-04-01 – 2016-04-14 (×14): 1 mg via ORAL
  Filled 2016-03-31 (×14): qty 1

## 2016-03-31 MED ORDER — ATORVASTATIN CALCIUM 80 MG PO TABS
80.0000 mg | ORAL_TABLET | Freq: Every day | ORAL | Status: DC
  Administered 2016-04-01 – 2016-04-02 (×2): 80 mg via ORAL
  Filled 2016-03-31 (×2): qty 1

## 2016-03-31 MED ORDER — IPRATROPIUM-ALBUTEROL 0.5-2.5 (3) MG/3ML IN SOLN: 3.0000 mL | Freq: Four times a day (QID) | RESPIRATORY_TRACT | Status: DC | PRN

## 2016-03-31 MED ORDER — PROCHLORPERAZINE EDISYLATE 5 MG/ML IJ SOLN: 5.0000 mg | Freq: Four times a day (QID) | INTRAMUSCULAR | Status: DC | PRN

## 2016-03-31 MED ORDER — DIPHENHYDRAMINE HCL 12.5 MG/5ML PO ELIX: 12.5000 mg | ORAL_SOLUTION | Freq: Four times a day (QID) | ORAL | Status: DC | PRN

## 2016-03-31 MED ORDER — POLYETHYLENE GLYCOL 3350 17 G PO PACK
17.0000 g | PACK | Freq: Every day | ORAL | Status: DC | PRN
  Administered 2016-04-11 – 2016-04-14 (×2): 17 g via ORAL
  Filled 2016-03-31 (×2): qty 1

## 2016-03-31 MED ORDER — AMLODIPINE BESYLATE 10 MG PO TABS: 10.0000 mg | ORAL_TABLET | Freq: Every day | ORAL | Status: DC

## 2016-03-31 MED ORDER — PROCHLORPERAZINE 25 MG RE SUPP: 12.5000 mg | Freq: Four times a day (QID) | RECTAL | Status: DC | PRN

## 2016-03-31 MED ORDER — BISACODYL 10 MG RE SUPP
10.0000 mg | Freq: Every day | RECTAL | Status: DC | PRN
  Administered 2016-04-11: 10 mg via RECTAL
  Filled 2016-03-31 (×2): qty 1

## 2016-03-31 MED ORDER — TRAZODONE HCL 50 MG PO TABS
25.0000 mg | ORAL_TABLET | Freq: Every evening | ORAL | Status: DC | PRN
  Administered 2016-04-03 – 2016-04-13 (×7): 50 mg via ORAL
  Filled 2016-03-31 (×7): qty 1

## 2016-03-31 MED ORDER — FOLIC ACID 1 MG PO TABS: 1.0000 mg | ORAL_TABLET | Freq: Every day | ORAL | Status: DC

## 2016-03-31 MED ORDER — GUAIFENESIN 100 MG/5ML PO SOLN: 5.0000 mL | ORAL | 0 refills | Status: DC | PRN

## 2016-03-31 MED ORDER — BOOST / RESOURCE BREEZE PO LIQD
1.0000 | Freq: Three times a day (TID) | ORAL | Status: DC
  Administered 2016-04-02 – 2016-04-14 (×30): 1 via ORAL

## 2016-03-31 MED ORDER — ACETAMINOPHEN 325 MG PO TABS
325.0000 mg | ORAL_TABLET | ORAL | Status: DC | PRN
  Administered 2016-04-01: 325 mg via ORAL
  Administered 2016-04-02 – 2016-04-11 (×4): 650 mg via ORAL
  Filled 2016-03-31 (×5): qty 2

## 2016-03-31 MED ORDER — SPIRONOLACTONE 25 MG PO TABS
25.0000 mg | ORAL_TABLET | Freq: Every day | ORAL | Status: DC
  Administered 2016-04-01 – 2016-04-14 (×14): 25 mg via ORAL
  Filled 2016-03-31 (×14): qty 1

## 2016-03-31 MED ORDER — METOPROLOL TARTRATE 50 MG PO TABS
100.0000 mg | ORAL_TABLET | Freq: Two times a day (BID) | ORAL | Status: DC
  Administered 2016-03-31 – 2016-04-14 (×28): 100 mg via ORAL
  Filled 2016-03-31 (×28): qty 2

## 2016-03-31 MED ORDER — GUAIFENESIN-DM 100-10 MG/5ML PO SYRP
5.0000 mL | ORAL_SOLUTION | Freq: Four times a day (QID) | ORAL | Status: DC | PRN
  Administered 2016-04-01 – 2016-04-04 (×6): 10 mL via ORAL
  Filled 2016-03-31 (×6): qty 10

## 2016-03-31 MED ORDER — ENOXAPARIN SODIUM 40 MG/0.4ML ~~LOC~~ SOLN
40.0000 mg | SUBCUTANEOUS | Status: DC
  Administered 2016-03-31 – 2016-04-13 (×14): 40 mg via SUBCUTANEOUS
  Filled 2016-03-31 (×14): qty 0.4

## 2016-03-31 MED ORDER — LOSARTAN POTASSIUM 50 MG PO TABS: 25.0000 mg | ORAL_TABLET | Freq: Every day | ORAL | Status: DC

## 2016-03-31 MED ORDER — FLUTICASONE PROPIONATE 50 MCG/ACT NA SUSP
2.0000 | Freq: Every day | NASAL | Status: DC
  Administered 2016-04-02 – 2016-04-14 (×12): 2 via NASAL
  Filled 2016-03-31: qty 16

## 2016-03-31 NOTE — Progress Notes (Signed)
Physical Therapy Treatment Patient Details Name: Angie Favret MRN: [id number] DOB: 04-17-1950 Today's Date: 03/31/2016    History of Present Illness 65 yo male smoker with dyspnea, bloody sputum, and syncope with PEA cardiac arrest.  Found to have ICH on CT head, right posterior frontal lobe. He was intuabted 12/12-12/16. PMHx of CVA, CAD, ETOH, HTN.    PT Comments    Pt making steady progress. Continue to feel he needs CIR due to cognitive, balance, and mobility issues.  Follow Up Recommendations  CIR     Equipment Recommendations  Other (comment) (To be determined)    Recommendations for Other Services       Precautions / Restrictions Precautions Precautions: Fall Restrictions Weight Bearing Restrictions: No    Mobility  Bed Mobility Overal bed mobility: Needs Assistance Bed Mobility: Supine to Sit     Supine to sit: Mod assist     General bed mobility comments: Assist to bring legs off bed and to elevate trunk into sitting  Transfers Overall transfer level: Needs assistance Equipment used: 1 person hand held assist Transfers: Sit to/from Stand Sit to Stand: Mod assist         General transfer comment: Assist to bring hips up and for balance. Pt with posterior bias.  Ambulation/Gait Ambulation/Gait assistance: +2 safety/equipment;Mod assist Ambulation Distance (Feet): 75 Feet Assistive device: 1 person hand held assist Gait Pattern/deviations: Step-through pattern;Decreased step length - right;Decreased step length - left;Decreased dorsiflexion - left;Drifts right/left;Leaning posteriorly Gait velocity: decr Gait velocity interpretation: Below normal speed for age/gender General Gait Details: Assist for balance and support. Pt with posterior lean that he is unable to correct with verbal and tactile cues. Followed with chair.   Stairs            Wheelchair Mobility    Modified [place] (Stroke Patients Only) Modified [place] (Stroke Patients  Only) Pre-Morbid Hooser Score: Moderately severe disability (Not exactly sure of pre-morbid status) Modified [place]: Moderately severe disability     Balance Overall balance assessment: Needs assistance Sitting-balance support: Feet supported;Bilateral upper extremity supported Sitting balance-Leahy Scale: Poor Sitting balance - Comments: Sat EOB with min guard   Standing balance support: Single extremity supported Standing balance-Leahy Scale: Poor Standing balance comment: hand held and min to mod A due to posterior lean                    Cognition Arousal/Alertness: Awake/alert Behavior During Therapy: Flat affect Overall Cognitive Status: No family/caregiver present to determine baseline cognitive functioning Area of Impairment: Memory;Safety/judgement;Problem solving;Attention;Orientation Orientation Level: Disoriented to;Situation Current Attention Level: Sustained Memory: Decreased short-term memory   Safety/Judgement: Decreased awareness of safety;Decreased awareness of deficits   Problem Solving: Slow processing;Decreased initiation;Requires verbal cues;Requires tactile cues      Exercises      General Comments        Pertinent Vitals/Pain Pain Assessment: No/denies pain    Home Living                      Prior Function            PT Goals (current goals can now be found in the care plan section) Progress towards PT goals: Progressing toward goals    Frequency    Min 3X/week      PT Plan Current plan remains appropriate    Co-evaluation             End of Session Equipment Utilized During Treatment: Gait belt Activity  Tolerance: Patient tolerated treatment well Patient left: with call bell/phone within reach;in chair;with chair alarm set     Time: [id number] PT Time Calculation (min) (ACUTE ONLY): 19 min  Charges:  $Gait Training: 8-22 mins                    G CodesAngelina Ok:      Lanay Zinda W [facility] 03/31/2016, 12:37  PM Fluor CorporationCary Tranquilino Fischler PT [phone]

## 2016-03-31 NOTE — Progress Notes (Signed)
Standley Brooking, RN Rehab Admission Coordinator Signed Physical Medicine and Rehabilitation  PMR Pre-admission Date of Service: 03/31/2016 3:25 PM  Related encounter: ED to Hosp-Admission (Discharged) from 03/23/2016 in [facility]       [] Hide copied text PMR Admission Coordinator Pre-Admission Assessment Patient: Randall Patel is an 65 y.o., male MRN: [id number] DOB: Dec 26, 1950 Height: 5\' 6"  (167.6 cm) Weight: 68.1 kg (150 lb 1.6 oz)                                                                                                                                                  Insurance Information HMO: yes    PPO:      PCP:      IPA:      80/20:     OTHER: medicare advantage plan PRIMARY: [employer]      Policy#: [id number]      Subscriber: pt CM Name: Randall Patel      Phone#: [phone] ext [phone]     Fax#: [phone] Pre-Cert#: [id number] approved for 7 days; first review after initial conference. F/u [name] phone [phone] ext [phone] fax; [phone]      Employer: disabled Benefits:  Phone #: [phone]     Name: 03/30/2016 Eff. Date: 02/11/16     Deduct: none      Out of Pocket Max: [phone]      Life Max: none CIR: $500 co pay per day days 1-3 then covers 100%      SNF: no co pay days 1020; $164.50 co pay per day days 21-100 Outpatient: 80%     Co-Pay: visits per medical necessity Home Health: 100%      Co-Pay: visits per medical necessity DME: 80%     Co-Pay: 20% Providers: in network  SECONDARY: Medicaid of [place]      Policy#: [id number]      Subscriber: pt  Medicaid Application Date:       Case Manager:  Disability Application Date:       Case Worker:   Emergency Contact Information        Contact Information    Name Relation Home Work Mobile   Randall Patel Sister [phone]  [phone]   Nation, Cradle   [phone]   Randall Patel   [phone]   Patel, Randall   [phone]       Current Medical History  Patient Admitting Diagnosis: anoxic BI  History of Present Illness: HPI: Randall Rankinis a 65 y.o.malewith history of CAD with ICM, ETOH abuse, 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 EMS found patient unresponsive on the ground with blood sputum but with pulse. He went cardiac arrest--PEA treated with CPR x 6 minutes and epinephrine with ROSC. He was acidotic at admission and intubated and started  on IV antibiotics. UDS positive for benzos and THC. MRI brain revealed subacute hematoma right frontal lobe--31 mm. Cardiology consulted and elevated cardiac enzymes felt to be due to demand ischemia in setting of cardiac arrest. 2 D echo with EF 45-50% with hypokinesis of inferior and inferoseptal myocardium and moderately increased pulmonary pressures. EEG with suggestion of global anoxic injury and felt that patient with asymptomatic ICH with incidental old finding from prior hemorrhage 03/2015. Right posterior frontal hematoma with old right anterior frontal infarct and to continue ASA. Encephalopathy felt to be due to ABI. He has had issues with agitation requiring precedex and pulmonary edema treated with diuresis. Significant agitation with tachycardia treated with BB and elevated HTN being managed with titration of meds. 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  Past Medical History      Past Medical History:  Diagnosis Date  . CAD (coronary artery disease)    a. prior MI in 2006 b. 03/2012: 30% ostial LM stenosis, 30% pLAD stenosis, 60% mid-LADm 30% pCx, 3rd OM which was small in size with diffuse 90% stenosis involving both branches, and RCA with 50% mid-stenosis and 100% distal occlusion. POBA to PL branch and DES placement to dRCA.  [name] History of stroke   . Ischemic  cardiomyopathy    a. EF 40-45% by echo in 03/2015    Family History  family history includes Heart attack in his father; Hypertension in his father.  Prior Rehab/Hospitalizations:  Has the patient had major surgery during 100 days prior to admission? No CIR 03/2015 for 15 days and d/c'd home with sister, Randall Patel at supervision level  Current Medications   Current Facility-Administered Medications:  .  acetaminophen (TYLENOL) tablet 650 mg, 650 mg, Oral, Q6H PRN, Tobey Grim, NP .  amLODipine (NORVASC) tablet 10 mg, 10 mg, Oral, Daily, Storm Frisk, MD, 10 mg at 03/31/16 5093 .  feeding supplement (BOOST / RESOURCE BREEZE) liquid 1 Container, 1 Container, Oral, TID BM, Alyson Reedy, MD, 1 Container at 03/31/16 [phone] .  fentaNYL (SUBLIMAZE) injection 25 mcg, 25 mcg, Intravenous, Q2H PRN, Coralyn Helling, MD, 25 mcg at 03/30/16 2458 .  fluticasone (FLONASE) 50 MCG/ACT nasal spray 2 spray, 2 spray, Each Nare, Daily, Kalman Shan, MD, 2 spray at 03/31/16 0820 .  folic acid (FOLVITE) tablet 1 mg, 1 mg, Oral, Daily, Tobey Grim, NP, 1 mg at 03/31/16 0819 .  guaiFENesin (ROBITUSSIN) 100 MG/5ML solution 100 mg, 5 mL, Oral, Q4H PRN, Tobey Grim, NP, 100 mg at 03/29/16 1529 .  hydrALAZINE (APRESOLINE) injection 10-40 mg, 10-40 mg, Intravenous, Q4H PRN, Oretha Milch, MD, 20 mg at 03/30/16 1136 .  HYDROcodone-acetaminophen (NORCO/VICODIN) 5-325 MG per tablet 1-2 tablet, 1-2 tablet, Oral, Q4H PRN, Tobey Grim, NP, 1 tablet at 03/31/16 0818 .  Influenza vac split quadrivalent PF (FLUARIX) injection 0.5 mL, 0.5 mL, Intramuscular, Prior to discharge, Coralyn Helling, MD .  ipratropium-albuterol (DUONEB) 0.5-2.5 (3) MG/3ML nebulizer solution 3 mL, 3 mL, Nebulization, Q6H PRN, Alyson Reedy, MD .  LORazepam (ATIVAN) injection 1 mg, 1 mg, Intravenous, Q4H PRN, Coralyn Helling, MD, 1 mg at 03/28/16 0225 .  magnesium sulfate IVPB 2 g 50 mL, 2 g, Intravenous, Once, Kalman Shan, MD .  MEDLINE mouth rinse, 15 mL, Mouth Rinse, BID, Alyson Reedy, MD, 15 mL at 03/31/16 0820 .  metoprolol (LOPRESSOR) injection 5 mg, 5 mg,  Intravenous, Q4H PRN, Tobey Grim, NP, 5 mg at 03/31/16 1610 .  metoprolol (LOPRESSOR) tablet 100 mg, 100 mg, Oral, BID, Storm Frisk, MD, 100 mg at 03/31/16 0818 .  multivitamin with minerals tablet 1 tablet, 1 tablet, Oral, Daily, Tobey Grim, NP, 1 tablet at 03/31/16 0818 .  ondansetron (ZOFRAN) injection 4 mg, 4 mg, Intravenous, Q6H PRN, Alyson Reedy, MD, 4 mg at 03/26/16 0452 .  pneumococcal 23 valent vaccine (PNU-IMMUNE) injection 0.5 mL, 0.5 mL, Intramuscular, Prior to discharge, Coralyn Helling, MD .  spironolactone (ALDACTONE) tablet 25 mg, 25 mg, Oral, Daily, Tobey Grim, NP, 25 mg at 03/31/16 0819 .  thiamine (VITAMIN B-1) tablet 100 mg, 100 mg, Oral, Daily, Tobey Grim, NP, 100 mg at 03/31/16 9604  Patients Current Diet: DIET DYS 2 Room service appropriate? Yes; Fluid consistency: Thin  Precautions / Restrictions Precautions Precautions: Fall Restrictions Weight Bearing Restrictions: No   Has the patient had 2 or more falls or a fall with injury in the past year?No  Prior Activity Level Patient Mod I with cane pta, does not drive, independent with all adls  Home Assistive Devices / Equipment Home Assistive Devices/Equipment: None  Prior Device Use: Indicate devices/aids used by the patient prior to current illness, exacerbation or injury? cane  Prior Functional Level Prior Function Comments: Pt unable to provide info   Self Care: Did the patient need help bathing, dressing, using the toilet or eating?  Independent  Indoor Mobility: Did the patient need assistance with walking from room to room (with or without device)? Independent  Stairs: Did the patient need assistance with internal or external stairs (with or without device)? Independent  Functional Cognition: Did the  patient need help planning regular tasks such as shopping or remembering to take medications? Independent  Current Functional Level Cognition Overall Cognitive Status: No family/caregiver present to determine baseline cognitive functioning Current Attention Level: Sustained Orientation Level: Oriented X4 Following Commands: Follows one step commands consistently Safety/Judgement: Decreased awareness of safety, Decreased awareness of deficits General Comments: Pt perseverates that he is on the back porch or in a kitchen - intermittently he is aware that he is in the hospital.  He requires mod cues for problem solving during simple ADL tasks     Extremity Assessment (includes Sensation/Coordination) Upper Extremity Assessment: RUE deficits/detail, LUE deficits/detail RUE Deficits / Details: Pt with mild spasticity noted, but able to use Rt UE functionally during ADL tasks  LUE Deficits / Details: Pt with mild spasticity.  He is able to move through 3/4 ROM to touch his mouth.  He is able to elevate shoulder to ~100* shoulder flexion.  Movement moving out of synergy patterns.  Unable to reach with Lt hand as he has difficulty extending digits with UE in flexion  (tremor noted ) LUE Coordination: decreased fine motor, decreased gross motor  Lower Extremity Assessment: Defer to PT evaluation RLE Deficits / Details: Moving hip knee and ankle actively and against gravity RLE Coordination: decreased gross motor, decreased fine motor LLE Deficits / Details: Moving hip knee and ankle actively and against gravity LLE Coordination: decreased fine motor, decreased gross motor   ADLs Overall ADL's : Needs assistance/impaired Eating/Feeding: Set up, Supervision/ safety, Bed level Grooming: Wash/dry hands, Wash/dry face, Oral care, Brushing hair, Minimal assistance, Sitting, Bed level Upper Body Bathing: Moderate assistance, Sitting, Bed level Upper Body Bathing Details (indicate cue type and reason):  assist for Rt UE and chest  Lower Body Bathing: Total  assistance, Sit to/from stand Lower Body Bathing Details (indicate cue type and reason): Able to bathe thighs, but requires assist for balance  Upper Body Dressing : Maximal assistance, Sitting Lower Body Dressing: Total assistance, Sit to/from stand Toilet Transfer: Moderate assistance, Stand-pivot, BSC Toilet Transfer Details (indicate cue type and reason): pt with posterior lean, and assist to guide hips and maintain balance  Toileting- Clothing Manipulation and Hygiene: Total assistance, Sit to/from stand Functional mobility during ADLs: Moderate assistance   Mobility Overal bed mobility: Needs Assistance Bed Mobility: Supine to Sit Supine to sit: Mod assist Sit to supine: Mod assist General bed mobility comments: Assist to bring legs off bed and to elevate trunk into sitting   Transfers Overall transfer level: Needs assistance Equipment used: 1 person hand held assist Transfers: Sit to/from Stand Sit to Stand: Mod assist Stand pivot transfers: Mod assist General transfer comment: Assist to bring hips up and for balance. Pt with posterior bias.   Ambulation / Gait / Stairs / Wheelchair Mobility Ambulation/Gait Ambulation/Gait assistance: +2 safety/equipment, Mod assist Ambulation Distance (Feet): 75 Feet Assistive device: 1 person hand held assist Gait Pattern/deviations: Step-through pattern, Decreased step length - right, Decreased step length - left, Decreased dorsiflexion - left, Drifts right/left, Leaning posteriorly General Gait Details: Assist for balance and support. Pt with posterior lean that he is unable to correct with verbal and tactile cues. Followed with chair. Gait velocity: decr Gait velocity interpretation: Below normal speed for age/gender   Posture / Balance Dynamic Sitting Balance Sitting balance - Comments: Sat EOB with min guard Balance Overall balance assessment: Needs assistance Sitting-balance support:  Feet supported, Bilateral upper extremity supported Sitting balance-Leahy Scale: Poor Sitting balance - Comments: Sat EOB with min guard Postural control: Posterior lean, Right lateral lean, Left lateral lean Standing balance support: Single extremity supported Standing balance-Leahy Scale: Poor Standing balance comment: hand held and min to mod A due to posterior lean   Special needs/care consideration BiPAP/CPAP  N/a CPM  N/a Continuous Drip IV  N/a Dialysis  N/a Life Vest  N/a Oxygen  N/a Special Bed  N/a Trach Size  N/a Wound Vac (area)  N/a Skin sacral foam Bowel mgmt: continent Bladder mgmt: incontinent Diabetic mgmt  N/a   Previous Home Environment Living Arrangements: Other relatives (lives with his sister, Randall Bogusorter for years)  Lives With:  (sister, Visual merchandiserorter) Available Help at Discharge: Family, Available 24 hours/day Randall Patel([name] availble 24/7) Type of Home: House Home Layout: One level Home Access: Stairs to enter Entrance Stairs-Rails: Right, Left, Can reach both Entrance Stairs-Number of Steps: 4 Bathroom Shower/Tub: Tub/shower unit, Engineer, building servicesCurtain Bathroom Toilet: Standard Bathroom Accessibility: Yes How Accessible: Accessible via walker Home Care Services: No  Discharge Living Setting Plans for Discharge Living Setting: Patient's home, Lives with (comment) (pt lives with his sister, Randall Bogusorter) Type of Home at Discharge: House Discharge Home Layout: One level Discharge Home Access: Stairs to enter Entrance Stairs-Rails: Right, Left, Can reach both Entrance Stairs-Number of Steps: 4 Discharge Bathroom Shower/Tub: Tub/shower unit, Curtain Discharge Bathroom Toilet: Standard Discharge Bathroom Accessibility: Yes How Accessible: Accessible via walker Does the patient have any problems obtaining your medications?: No  Social/Family/Support Systems Patient Roles: Parent (HIs son, Adela GlimpseBernard, lives in [place]) SolicitorContact Information: Randall BogusPorter, sister Anticipated Caregiver:  sister Anticipated Caregiver's Contact Information: see above Ability/Limitations of Caregiver: no limitations Caregiver Availability: 24/7 Discharge Plan Discussed with Primary Caregiver: Yes Is Caregiver In Agreement with Plan?: Yes Does Caregiver/Family have Issues with Lodging/Transportation while Pt is in Rehab?: No  Goals/Additional Needs Patient/Family Goal for Rehab: supervision with PT, OT, and SLP Expected length of stay: ELOS 10- 14 days Pt/Family Agrees to Admission and willing to participate: Yes Program Orientation Provided & Reviewed with Pt/Caregiver Including Roles  & Responsibilities: Yes  Decrease burden of Care through IP rehab admission: n/a   Possible need for SNF placement upon discharge: not anticipated  Patient Condition: This patient's condition remains as documented in the consult dated 03/30/2016, in which the Rehabilitation Physician determined and documented that the patient's condition is appropriate for intensive rehabilitative care in an inpatient rehabilitation facility. Will admit to inpatient rehab today.   Preadmission Screen Completed By:  Clois Dupes, 03/31/2016 3:25 PM ______________________________________________________________________   Discussed status with Dr. Allena Katz on 03/31/2016 at  1537 and received telephone approval for admission today.  Admission Coordinator:  Clois Dupes, time 1537 Date       Cosigned by: Ankit Karis Juba, MD at 03/31/2016 3:45 PM  Revision History

## 2016-03-31 NOTE — Progress Notes (Signed)
Report given to inpatient rehab nurse at this time.  Pt has no s/s of any acute distress or c/o pain.

## 2016-03-31 NOTE — Progress Notes (Signed)
I have insurance approval as well as pt and sister's permission to admit pt to inpt rehab today. I will make the arrangements . [phone]

## 2016-03-31 NOTE — Progress Notes (Signed)
Speech Language Pathology Treatment: Dysphagia  Patient Details Name: Randall Patel MRN: [id number] DOB: 12-Jun-1950 Today's Date: 03/31/2016 Time: 2229-7989 SLP Time Calculation (min) (ACUTE ONLY): 12 min  Assessment / Plan / Recommendation Clinical Impression  Pt demonstrates adequate diet toleration with no concerns for aspiration with consumption of thin liquids.  He does have persisting left sensorimotor deficits and cognitive deficits, leading to spillage from left side of mouth, poor awareness, and apathy. Mod cues overall for self-feeding/precautions.  Recommend continuing current diet (dys2, thin liquids) with full supervision for safety. Given new neuro dx, recommend SLP cognitive evaluation, either while here on acute care or in next venue.     HPI HPI: 65 yo male smoker with dyspnea, bloody sputum, and syncope with PEA cardiac arrest. Found to have ICH on CT head, right posterior frontal lobe. He was intuabted 12/12-12/16. PMHx of CVA with Rt hand weakness, CAD, ETOH, HTN. Pt was seen by SLP for dysphagia following stroke in12/2016. He initially was started on Dys 2 diet and nectar thick liquids. He went through repeat MBS while on inpatient rehab and was upgraded to Dys 2 diet and thin liquids prior to discharge.      SLP Plan  Continue with current plan of care     Recommendations  Diet recommendations: Dysphagia 2 (fine chop);Thin liquid Liquids provided via: Cup;Straw Medication Administration: Crushed with puree Supervision: Staff to assist with self feeding;Full supervision/cueing for compensatory strategies Compensations: Minimize environmental distractions;Slow rate;Small sips/bites;Lingual sweep for clearance of pocketing;Monitor for anterior loss;Follow solids with liquid Postural Changes and/or Swallow Maneuvers: Seated upright 90 degrees;Upright 30-60 min after meal                Oral Care Recommendations: Oral care BID Plan: Continue with current plan of  care       GO                Blenda Mounts Laurice 03/31/2016, 2:19 PM Mirinda Monte L. Samson Frederic, [place] CCC/SLP Pager [phone]

## 2016-03-31 NOTE — Discharge Summary (Addendum)
Physician Discharge Summary  Randall Patel JXB:[id number] DOB: 1950/07/04 DOA: 03/23/2016  PCP: Pcp Not In System  Admit date: 03/23/2016 Discharge date: 03/31/2016  Admitted From: HOME.  Disposition:  INPATIENT REHABILITATION.   Recommendations for Outpatient Follow-up:  1. Follow up with PCP in 1-2 weeks 2. Please obtain BMP/CBC in one week 3. Please follow up with neurology as needed 4. Please follow up with Dr Clifton James as needed.  5. Follow up with SLP     Discharge Condition:guarded.  CODE STATUS:full code.  Diet recommendation: dysphagia 2 diet. Thin liquid.   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 He walked into sister's bedroom and co sob. Found in br with bloody sputum and unresponsive but breathing. EMS was activated and they transported to [facility] ED but in route had PEA arrest and required Epi x 1 and intubation with #7 ett. Found to be acidotic ph 7.12, pco2 66, po2 47 in ed. [name] pulmonary edema on exam and cxr. 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  MRI shows sub acute right posterior frontal hematoma.  Off aspirin.  Therapy evals recommending inpatient rehab.    Hyperlipidemia: resume  statin on discharge.   Acute respiratory failure with pulmonary edema with hypoxia and hypercarbia: Resolved. Off oxygen.  Completed levaquin.    PEA  Cardiac arrest with known CAD,s/p DES TO RCA,  Cath precluded sec to hematoma, resume bb, statin.  Ace inhibitor can be started at a later time but there is report of allergy to ACE inhibitor.    Non ischemic cardiomyopathy:  EF 40 to 45% by echo in 03/2015   Elevated troponin's: from demand ischemia from his cardiac arrest.   Accelerated hypertension:  Was on nicardipine drip and is off the last 24 hours.  bp is decent and is on metoprolol, amlodipine.   Discharge Instructions  Discharge Instructions    Diet - low sodium heart healthy    Complete by:  As directed      Allergies as of 03/31/2016      Reactions   Lisinopril Swelling   This allergy is noted from previous electronic record; however, pt. Is unable to confirm this allergy at this time   Other    Son states he is allergic to an antibiotics  Not sur which one. Also pharmacy has no records of any allergies       Medication List    STOP taking these medications   aspirin 81 MG chewable tablet     TAKE these medications   amLODipine 10 MG tablet Commonly known as:  NORVASC Take 1 tablet (10 mg total) by mouth daily. Start taking on:  04/01/2016   atorvastatin 80 MG tablet Commonly known  as:  LIPITOR Take 1 tablet (80 mg total) by mouth daily at 6 PM.   feeding supplement Liqd Take 1 Container by mouth 3 (three) times daily between meals.   fluticasone 50 MCG/ACT nasal spray Commonly known as:  FLONASE Place 2 sprays into both nostrils daily. Start taking on:  04/01/2016   folic acid 1 MG tablet Commonly known as:  FOLVITE Take 1 tablet (1 mg total) by mouth daily. Start taking on:  04/01/2016   guaiFENesin 100 MG/5ML Soln Commonly known as:  ROBITUSSIN Take 5 mLs (100 mg total) by mouth every 4 (four) hours as needed for cough or to loosen  phlegm.   ipratropium-albuterol 0.5-2.5 (3) MG/3ML Soln Commonly known as:  DUONEB Take 3 mLs by nebulization every 6 (six) hours as needed.   metoprolol 100 MG tablet Commonly known as:  LOPRESSOR Take 1 tablet (100 mg total) by mouth 2 (two) times daily.   multivitamin with minerals Tabs tablet Take 1 tablet by mouth daily. Start taking on:  04/01/2016   spironolactone 25 MG tablet Commonly known as:  ALDACTONE Take 1 tablet (25 mg total) by mouth daily. Start taking on:  04/01/2016   thiamine 100 MG tablet Take 1 tablet (100 mg total) by mouth daily. Start taking on:  04/01/2016       Allergies  Allergen Reactions  . Lisinopril Swelling    This allergy is noted from previous electronic record; however, pt. Is unable to confirm this allergy at this time  . Other     Son states he is allergic to an antibiotics  Not sur which one. Also pharmacy has no records of any allergies     Consultations:  Neurology  Cardiology  PCCM.    Procedures/Studies: Ct Head Wo Contrast  Result Date: 03/29/2016 CLINICAL DATA:  65 y/o  M; status post cardiac arrest with headache. EXAM: CT HEAD WITHOUT CONTRAST TECHNIQUE: Contiguous axial images were obtained from the base of the skull through the vertex without intravenous contrast. COMPARISON:  03/24/2016 MRI of the brain. 03/23/2016 CT of the head. FINDINGS: Brain: Stable subacute hematoma within the right frontal lobe. Stable chronic right anterior superior and right lateral frontal infarcts. Stable chronic lacunar infarct in the right basal ganglia. No evidence for new large acute territory infarct, focal mass effect, or intracranial hemorrhage. No hydrocephalus or extra-axial collection. Stable ex vacuo dilatation of frontal horn of right lateral ventricle. Mild brain parenchymal volume loss. Vascular: No hyperdense vessel. Calcific atherosclerosis of cavernous internal carotid arteries. Skull: Normal. Negative for fracture or focal  lesion. Sinuses/Orbits: Mild mucosal thickening within sphenoid and maxillary sinuses. Normal pneumatization of mastoid air cells. Opacification and external auditory canals is likely cerumen. Normal orbits. Other: None. IMPRESSION: 1. No acute intracranial abnormality is identified. 2. Stable subacute hematoma within the right frontal lobe. 3. Stable right frontal lobe and right basal ganglia chronic infarcts. 4. Mild paranasal sinus disease. Electronically Signed   By: Mitzi Hansen M.D.   On: 03/29/2016 15:00   Ct Head Wo Contrast  Result Date: 03/23/2016 CLINICAL DATA:  Fall earlier today.  Episode of cardiac arrest EXAM: CT HEAD WITHOUT CONTRAST TECHNIQUE: Contiguous axial images were obtained from the base of the skull through the vertex without intravenous contrast. COMPARISON:  None. FINDINGS: Brain: There is mild diffuse atrophy. There is a prior infarct in the anteromedial right frontal lobe. There is decreased attenuation in the mid superior right frontal lobe with foci of hemorrhage within this lesion, likely acute  partially hemorrhagic infarct. Infarct extends more inferiorly the level of the right internal capsule. Elsewhere gray-white compartments appear normal. There is no midline shift or extra-axial fluid collection. Vascular: There is no appreciable hyperdense vessel. There is calcification in each carotid siphon region. Skull: The bony calvarium appears intact. A defect in the medial left orbital wall may be congenital or residua of prior trauma. Sinuses/Orbits: There is opacification in multiple ethmoid air cells bilaterally. There is mucosal thickening in both maxillary antra, more severe on the right than on the left. There is opacification with air-fluid levels in each sphenoid sinus. There is leftward deviation of the nasal septum. Orbits appear symmetric bilaterally. Other: Mastoid air cells are clear. IMPRESSION: Evidence indicating acute partially hemorrhagic infarct in  the right frontal lobe mid to posterior aspect. Old infarct more anteriorly in the right frontal lobe. Mild underlying atrophy. Areas of paranasal sinus disease at multiple sites. Areas of arterial vascular calcification noted. Critical Value/emergent results were called by telephone at the time of interpretation on 03/23/2016 at 9:06 am to Dr. Burlene ArntShlossman, ED physician , who verbally acknowledged these results. Electronically Signed   By: Bretta BangWilliam  Woodruff III M.D.   On: 03/23/2016 09:06   Mr Brain Wo Contrast  Result Date: 03/24/2016 CLINICAL DATA:  65 y/o M; found unresponsive with episode of cardiac arrest. EXAM: MRI HEAD WITHOUT CONTRAST TECHNIQUE: Multiplanar, multiecho pulse sequences of the brain and surrounding structures were obtained without intravenous contrast. COMPARISON:  03/23/2016 CT of the head. FINDINGS: Brain: Hemosiderin stained encephalomalacia of the right superior anterior frontal lobe and of the right lateral frontal lobe compatible with chronic infarction. Hematoma centered in the right posterior frontal lobe measuring 27 x 31 x 20 mm (AP x ML x CC) with T1 and T2 hyperintensity compatible with subacute hematoma. No diffusion hyperintensity to suggest acute/early subacute infarct. No hydrocephalus. No extra-axial collection. Vascular: Normal flow voids. Skull and upper cervical spine: Normal marrow signal. Sinuses/Orbits: Chronic left lamina papyracea fracture. Orbits are unremarkable. Mild paranasal sinus mucosal thickening, fluid in nasopharynx, and patchy opacification of ethmoid air cells, probably due to intubation. Other: None. IMPRESSION: 1. Subacute hematoma in the right posterior frontal lobe measuring up to 31 mm. 2. Chronic right anterior superior and right lateral frontal infarcts. 3. No evidence for acute/early subacute infarct or significant mass effect. Electronically Signed   By: Mitzi HansenLance  Furusawa-Stratton M.D.   On: 03/24/2016 05:49   Dg Chest Port 1 View  Result Date:  03/27/2016 CLINICAL DATA:  ET tube EXAM: PORTABLE CHEST 1 VIEW COMPARISON:  03/26/2016 FINDINGS: Support devices including endotracheal tube are stable. Mild cardiomegaly. Bibasilar atelectasis. No overt edema or effusions. IMPRESSION: Mild cardiomegaly and bibasilar atelectasis. Electronically Signed   By: Charlett NoseKevin  Dover M.D.   On: 03/27/2016 08:00   Dg Chest Port 1 View  Result Date: 03/26/2016 CLINICAL DATA:  Hypoxia EXAM: PORTABLE CHEST 1 VIEW COMPARISON:  March 25, 2016 FINDINGS: Endotracheal tube tip is 6.5 cm above the carina. Central catheter tip is in the superior vena cava. Nasogastric tube tip and side port are below the diaphragm. No pneumothorax. There is atelectatic change in the right base region. Lungs elsewhere are clear. Heart size and pulmonary vascularity are normal. No adenopathy. There is atherosclerotic calcification in the aorta. No bone lesions. IMPRESSION: Tube and catheter positions as described without pneumothorax. Right base atelectasis. Lungs elsewhere clear. There is aortic atherosclerosis. Electronically Signed   By: Bretta BangWilliam  Woodruff III M.D.   On: 03/26/2016 07:13  Dg Chest Port 1 View  Result Date: 03/25/2016 CLINICAL DATA:  Endotracheal tube EXAM: PORTABLE CHEST 1 VIEW COMPARISON:  03/24/2016 FINDINGS: Endotracheal tube in good position. Left jugular catheter tip in the SVC. NG tube enters the stomach. No pneumothorax. Minimal bibasilar atelectasis unchanged. Resolution of pulmonary edema. IMPRESSION: Endotracheal tube in good position Resolution of pulmonary edema.  Mild bibasilar atelectasis. Electronically Signed   By: Marlan Palau M.D.   On: 03/25/2016 07:10   Dg Chest Port 1 View  Result Date: 03/24/2016 CLINICAL DATA:  Hypoxia EXAM: PORTABLE CHEST 1 VIEW COMPARISON:  March 23, 2016 FINDINGS: Endotracheal tube tip is 7.0 cm above the carina. Central catheter tip is in superior vena cava. Nasogastric tube tip and side port are below the diaphragm. No  pneumothorax. There has been significant interval resolution of alveolar edema compared to 1 day prior. Mild patchy interstitial alveolar edema do remain in the mid lower lung zones. No new opacity. Heart is upper normal in size with pulmonary vascularity within normal limits. No adenopathy. There is atherosclerotic calcification in the aorta. No bone lesions. IMPRESSION: Tube and catheter positions as described without evident pneumothorax. There remains patchy interstitial and alveolar edema, much less than 1 day prior. No new opacity. Stable cardiac silhouette. There is aortic atherosclerosis. Electronically Signed   By: Bretta Bang III M.D.   On: 03/24/2016 08:37   Dg Chest Portable 1 View  Result Date: 03/23/2016 CLINICAL DATA:  Central line placement EXAM: PORTABLE CHEST 1 VIEW COMPARISON:  03/23/2016 FINDINGS: Left central line has been placed with the tip in the SVC. No pneumothorax. Bilateral severe airspace disease, right greater than left, unchanged. Endotracheal tube and NG tube are in stable position. Heart is normal size. IMPRESSION: Left central line tip in the SVC.  No pneumothorax. Stable severe bilateral airspace disease. Electronically Signed   By: Charlett Nose M.D.   On: 03/23/2016 08:29   Dg Chest Port 1 View  Result Date: 03/23/2016 CLINICAL DATA:  65 year old male with respiratory distress. Status post CPR and intubation. EXAM: PORTABLE CHEST 1 VIEW COMPARISON:  None. FINDINGS: Endotracheal tube with tip approximately 7.7 cm above the carina. Diffuse bilateral airspace opacities noted which may represent multifocal pneumonia, ARDS, alveolar hemorrhage. There is no pleural effusion or pneumothorax. There is mild cardiomegaly. No acute osseous pathology identified. IMPRESSION: Endotracheal tube above the carina. Diffuse bilateral airspace opacities. Cardiomegaly. Electronically Signed   By: Elgie Collard M.D.   On: 03/23/2016 06:33      Subjective: No head aches , no  chest pain  Discharge Exam: Vitals:   03/31/16 0646 03/31/16 1200  BP: (!) 137/94 (!) 159/85  Pulse: (!) 52 (!) 51  Resp: (!) 27 (!) 26  Temp:  98.4 F (36.9 C)   Vitals:   03/31/16 0536 03/31/16 0622 03/31/16 0646 03/31/16 1200  BP: (!) 169/88 (!) 172/95 (!) 137/94 (!) 159/85  Pulse: (!) 57 75 (!) 52 (!) 51  Resp: 16 13 (!) 27 (!) 26  Temp: 98.8 F (37.1 C)   98.4 F (36.9 C)  TempSrc: Oral   Oral  SpO2: 97% 98% 97% 100%  Weight: 68.1 kg (150 lb 1.6 oz)     Height:        General: Pt is alert, awake, not in acute distress Cardiovascular: RRR, S1/S2 +, no rubs, no gallops Respiratory: CTA bilaterally, no wheezing, no rhonchi Abdominal: Soft, NT, ND, bowel sounds + Extremities: no edema, no cyanosis    The results of  significant diagnostics from this hospitalization (including imaging, microbiology, ancillary and laboratory) are listed below for reference.     Microbiology: Recent Results (from the past 240 hour(s))  Urine culture     Status: None   Collection Time: 03/23/16  6:50 AM  Result Value Ref Range Status   Specimen Description URINE, CATHETERIZED  Final   Special Requests Normal  Final   Culture NO GROWTH  Final   Report Status 03/24/2016 FINAL  Final  Culture, blood (Routine X 2) w Reflex to ID Panel     Status: None   Collection Time: 03/23/16  9:50 AM  Result Value Ref Range Status   Specimen Description BLOOD LEFT PICC LINE  Final   Special Requests BOTTLES DRAWN AEROBIC AND ANAEROBIC 5CC  Final   Culture NO GROWTH 5 DAYS  Final   Report Status 03/28/2016 FINAL  Final  Culture, blood (Routine X 2) w Reflex to ID Panel     Status: None   Collection Time: 03/23/16 10:10 AM  Result Value Ref Range Status   Specimen Description BLOOD RIGHT HAND  Final   Special Requests IN PEDIATRIC BOTTLE 4CC  Final   Culture NO GROWTH 5 DAYS  Final   Report Status 03/28/2016 FINAL  Final  MRSA PCR Screening     Status: None   Collection Time: 03/23/16 11:59 AM   Result Value Ref Range Status   MRSA by PCR NEGATIVE NEGATIVE Final    Comment:        The GeneXpert MRSA Assay (FDA approved for NASAL specimens only), is one component of a comprehensive MRSA colonization surveillance program. It is not intended to diagnose MRSA infection nor to guide or monitor treatment for MRSA infections.   Culture, respiratory (tracheal aspirate)     Status: None   Collection Time: 03/23/16  2:38 PM  Result Value Ref Range Status   Specimen Description TRACHEAL ASPIRATE  Final   Special Requests Normal  Final   Gram Stain   Final    MODERATE WBC PRESENT, PREDOMINANTLY PMN NO ORGANISMS SEEN    Culture Consistent with normal respiratory flora.  Final   Report Status 03/25/2016 FINAL  Final     Labs: BNP (last 3 results)  Recent Labs  03/23/16 0645  BNP 540.2*   Basic Metabolic Panel:  Recent Labs Lab 03/26/16 0258 03/26/16 1630 03/27/16 0430 03/28/16 0340 03/29/16 0330 03/29/16 1157 03/30/16 0303  NA 136  --  140 142 137  --  140  K 2.9*  --  4.2 3.6 3.4*  --  3.5  CL 105  --  105 110 104  --  105  CO2 24  --  24 24 21*  --  21*  GLUCOSE 121*  --  132* 126* 118*  --  123*  BUN 6  --  23* 14 13  --  18  CREATININE 0.88  --  0.95 0.76 0.77  --  0.71  CALCIUM 8.8*  --  9.4 9.0 10.0  --  10.1  MG 1.9 1.8 2.2 1.9  --  2.6* 2.2  PHOS 1.9* 2.2* 1.7* 3.1  --  3.6 3.7   Liver Function Tests: No results for input(s): AST, ALT, ALKPHOS, BILITOT, PROT, ALBUMIN in the last 168 hours. No results for input(s): LIPASE, AMYLASE in the last 168 hours. No results for input(s): AMMONIA in the last 168 hours. CBC:  Recent Labs Lab 03/26/16 0258 03/27/16 0430 03/28/16 0340 03/29/16 0408 03/30/16 0303  WBC  7.6 8.7 6.5 10.2 11.0*  HGB 10.2* 11.6* 10.5* 13.4 13.9  HCT 31.0* 35.9* 32.7* 40.8 41.3  MCV 83.6 84.7 84.7 84.5 82.3  PLT 121* 190 163 224 302   Cardiac Enzymes: No results for input(s): CKTOTAL, CKMB, CKMBINDEX, TROPONINI in the last  168 hours. BNP: Invalid input(s): POCBNP CBG:  Recent Labs Lab 03/29/16 2333 03/30/16 0324 03/30/16 0834 03/30/16 1229 03/30/16 2007  GLUCAP 112* 122* 122* 127* 120*   D-Dimer No results for input(s): DDIMER in the last 72 hours. Hgb A1c No results for input(s): HGBA1C in the last 72 hours. Lipid Profile No results for input(s): CHOL, HDL, LDLCALC, TRIG, CHOLHDL, LDLDIRECT in the last 72 hours. Thyroid function studies No results for input(s): TSH, T4TOTAL, T3FREE, THYROIDAB in the last 72 hours.  Invalid input(s): FREET3 Anemia work up No results for input(s): VITAMINB12, FOLATE, FERRITIN, TIBC, IRON, RETICCTPCT in the last 72 hours. Urinalysis    Component Value Date/Time   COLORURINE YELLOW 03/23/2016 0650   APPEARANCEUR HAZY (A) 03/23/2016 0650   LABSPEC 1.010 03/23/2016 0650   PHURINE 5.0 03/23/2016 0650   GLUCOSEU >=500 (A) 03/23/2016 0650   HGBUR SMALL (A) 03/23/2016 0650   BILIRUBINUR NEGATIVE 03/23/2016 0650   KETONESUR NEGATIVE 03/23/2016 0650   PROTEINUR >=300 (A) 03/23/2016 0650   NITRITE NEGATIVE 03/23/2016 0650   LEUKOCYTESUR NEGATIVE 03/23/2016 0650   Sepsis Labs Invalid input(s): PROCALCITONIN,  WBC,  LACTICIDVEN Microbiology Recent Results (from the past 240 hour(s))  Urine culture     Status: None   Collection Time: 03/23/16  6:50 AM  Result Value Ref Range Status   Specimen Description URINE, CATHETERIZED  Final   Special Requests Normal  Final   Culture NO GROWTH  Final   Report Status 03/24/2016 FINAL  Final  Culture, blood (Routine X 2) w Reflex to ID Panel     Status: None   Collection Time: 03/23/16  9:50 AM  Result Value Ref Range Status   Specimen Description BLOOD LEFT PICC LINE  Final   Special Requests BOTTLES DRAWN AEROBIC AND ANAEROBIC 5CC  Final   Culture NO GROWTH 5 DAYS  Final   Report Status 03/28/2016 FINAL  Final  Culture, blood (Routine X 2) w Reflex to ID Panel     Status: None   Collection Time: 03/23/16 10:10 AM   Result Value Ref Range Status   Specimen Description BLOOD RIGHT HAND  Final   Special Requests IN PEDIATRIC BOTTLE 4CC  Final   Culture NO GROWTH 5 DAYS  Final   Report Status 03/28/2016 FINAL  Final  MRSA PCR Screening     Status: None   Collection Time: 03/23/16 11:59 AM  Result Value Ref Range Status   MRSA by PCR NEGATIVE NEGATIVE Final    Comment:        The GeneXpert MRSA Assay (FDA approved for NASAL specimens only), is one component of a comprehensive MRSA colonization surveillance program. It is not intended to diagnose MRSA infection nor to guide or monitor treatment for MRSA infections.   Culture, respiratory (tracheal aspirate)     Status: None   Collection Time: 03/23/16  2:38 PM  Result Value Ref Range Status   Specimen Description TRACHEAL ASPIRATE  Final   Special Requests Normal  Final   Gram Stain   Final    MODERATE WBC PRESENT, PREDOMINANTLY PMN NO ORGANISMS SEEN    Culture Consistent with normal respiratory flora.  Final   Report Status 03/25/2016 FINAL  Final  Time coordinating discharge: Over 30 minutes  SIGNED:   Kathlen Mody, MD  [facility] 03/31/2016, 3:48 PM Pager   If 7PM-7AM, please contact night-coverage [email or web address] Password [email or web address]

## 2016-03-31 NOTE — Progress Notes (Signed)
Ankit Karis Juba, MD Physician Signed Physical Medicine and Rehabilitation  Consult Note Date of Service: 03/30/2016 10:40 AM  Related encounter: ED to Hosp-Admission (Discharged) from 03/23/2016 in [facility]     Expand All Collapse All   [] Hide copied text [] Hover for attribution information      Physical Medicine and Rehabilitation Consult   Reason for Consult:  Anoxic BI Referring Physician: Dr.    Sula Rumple: Randall Patel is a 65 y.o. 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 EMS found patient unresponsive on the ground with blood sputum but with pulse. He went cardiac arrest--PEA treated with CPR x 6 minutes and epinephrine with ROSC. He was acidotic at admission and intubated and started on IV antibiotics. UDS positive for benzos and THC.  MRI brain revealed subacute hematoma right frontal lobe--31 mm. Cardiology consulted and elevated cardiac enzymes felt to be due to demand ischemia in setting of cardiac arrest.  2  D echo with EF 45-50% with hypokinesis of inferior and inferoseptal myocardium and moderately increased pulmonary pressures. EEG with suggestion of global anoxic injury and felt that patient with asymptomatic ICH with incidental old finding from prior hemorrhage 03/2015. Right posterior frontal hematoma with old right anterior frontal infarct and to continue ASA. Encephalopathy felt ot be due to ABI. He has had issues with agitation requiring precedex and pulmonary edema treated with diuresis. Significant agitation with tachycardia treated with BB and elevated HTN being managed with titration of meds.  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        Review of Systems  HENT: Negative for hearing loss and tinnitus.   Eyes:  Negative for blurred vision and double vision.  Respiratory: Negative for cough and shortness of breath.   Cardiovascular: Negative for chest pain and palpitations.  Gastrointestinal: Negative for abdominal pain, constipation and heartburn.  Genitourinary: Negative for frequency and urgency.  Musculoskeletal: Negative for back pain and joint pain.  Skin: Negative for itching and rash.  Neurological: Positive for focal weakness and weakness.       Uncontrolled spasms and grunts  All other systems reviewed and are negative.      Past Medical History:  Diagnosis Date  . CAD (coronary artery disease)    a. prior MI in 2006 b. 03/2012: 30% ostial LM stenosis, 30% pLAD stenosis, 60% mid-LADm 30% pCx, 3rd OM which was small in size with diffuse 90% stenosis involving both branches, and RCA with 50% mid-stenosis and 100% distal occlusion. POBA to PL branch and DES placement to dRCA.  [name] History of stroke   . Ischemic cardiomyopathy    a. EF 40-45% by echo in 03/2015   History reviewed. No pertinent surgical history. related to brain       Family History  Problem Relation Age of Onset  . Heart attack Father   . Hypertension Father    Social History:  Lives with sister (disabled and helps provide supervision).  Was independent without AD prior to admission. He reports that he has been smoking --1 PPD.  He has a 40.00 pack-year smoking history. He has never used smokeless tobacco. He reports that he drinks alcohol--6-12 pack beer and liquor "when he can get it".  Per reports that he uses drugs, including Marijuana.  Allergies  Allergen Reactions  . Lisinopril Swelling    This allergy is noted from previous electronic record; however, pt. Is unable to confirm this allergy at this time  . Other     Son states he is allergic to an antibiotics  Not sur which one. Also pharmacy has no records of any allergies           Medications Prior to Admission  Medication Sig  Dispense Refill  . aspirin 81 MG chewable tablet Chew by mouth daily.      Home: Home Living Family/patient expects to be discharged to:: Unsure  Functional History: Prior Function Comments: Poor historian; Unable to discern prior level of function; he was able to identify that his L side was effected by the CVA Functional Status:  Mobility: Bed Mobility Overal bed mobility: Needs Assistance Bed Mobility: Supine to Sit, Sit to Supine Supine to sit: Mod assist, +2 for safety/equipment Sit to supine: Mod assist, +2 for safety/equipment General bed mobility comments: Cues for technique and assist to elevate trunk into sitting and bring hips to EOB Transfers Overall transfer level: Needs assistance Equipment used: Rolling walker (2 wheeled) Transfers: Sit to/from Stand, Stand Pivot Transfers Sit to Stand: +2 safety/equipment, Min assist Stand pivot transfers: +2 safety/equipment, Min assist General transfer comment: Assist to bring hips up and for balance. Manual facilitation to place hands on walker.  Pt with hands on walker for pivot but not really using walker for support due to poor grip  Ambulation/Gait Ambulation/Gait assistance: Min assist, +2 safety/equipment Ambulation Distance (Feet): 10 Feet Assistive device: Rolling walker (2 wheeled) Gait Pattern/deviations: Step-through pattern, Decreased step length - right, Decreased step length - left, Drifts right/left, Trunk flexed General Gait Details: Assist for balance and support. Assist to move and guide walker with pt resting hands on walker after placed there but very little support from walker. Gait velocity: decr Gait velocity interpretation: Below normal speed for age/gender    ADL:    Cognition: Cognition Overall Cognitive Status: No family/caregiver present to determine baseline cognitive functioning Orientation Level: Oriented X4 Cognition Arousal/Alertness: Awake/alert Behavior During Therapy: Flat  affect Overall Cognitive Status: No family/caregiver present to determine baseline cognitive functioning Area of Impairment: Orientation, Attention, Memory, Safety/judgement, Problem solving Orientation Level: Disoriented to, Time, Situation Current Attention Level: Sustained Memory: Decreased short-term memory Safety/Judgement: Decreased awareness of safety, Decreased awareness of deficits Problem Solving: Slow processing, Decreased initiation, Requires verbal cues, Requires tactile cues General Comments: Pt seems to have inattention to rt and lt upper extremity  Blood pressure (!) 180/100, pulse 88, temperature 97.9 F (36.6 C), temperature source Oral, resp. rate (!) 23, height 5\' 6"  (1.676 m), weight 62.6 kg (138 lb 0.1 oz), SpO2 99 %. Physical Exam  Nursing note and vitals reviewed. Constitutional: He is oriented to person, place, and time. He appears well-developed and well-nourished.  HENT:  Head: Normocephalic and atraumatic.  Mouth/Throat: Oropharynx is clear and moist.  Eyes: EOM are normal. Pupils are equal, round, and reactive to light.  Neck: Normal range of motion. Neck supple.  Cardiovascular: Normal rate and regular rhythm.   Respiratory: Effort normal and breath sounds normal. No stridor. No respiratory distress. He has no wheezes.  GI: Soft. Bowel sounds are normal. He exhibits no distension. There is no tenderness.  Musculoskeletal: He exhibits no edema or tenderness.  Neurological: He is alert and oriented to person, place, and time.  Flat affect.  Mild left facial droop.  Speech clear and able to  follow basic motor commands without difficulty.   Motor: LUE: 4-/5 proxima to distal with apraxia RUE: 4/5 proximal to distal LLE: 4/5 proximal to distal RLE: 4+/5 proximal to distal DTRs symmetric, ?>Right side  Skin: Skin is warm and dry. No rash noted. He is not diaphoretic. No erythema.  Psychiatric: His affect is blunt. His speech is delayed. He is slowed.     Lab Results Last 24 Hours       Results for orders placed or performed during the hospital encounter of 03/23/16 (from the past 24 hour(s))  Glucose, capillary     Status: Abnormal   Collection Time: 03/29/16 11:53 AM  Result Value Ref Range   Glucose-Capillary 119 (H) 65 - 99 mg/dL  Magnesium     Status: Abnormal   Collection Time: 03/29/16 11:57 AM  Result Value Ref Range   Magnesium 2.6 (H) 1.7 - 2.4 mg/dL  Phosphorus     Status: None   Collection Time: 03/29/16 11:57 AM  Result Value Ref Range   Phosphorus 3.6 2.5 - 4.6 mg/dL  Glucose, capillary     Status: Abnormal   Collection Time: 03/29/16  4:22 PM  Result Value Ref Range   Glucose-Capillary 157 (H) 65 - 99 mg/dL  Glucose, capillary     Status: Abnormal   Collection Time: 03/29/16  8:16 PM  Result Value Ref Range   Glucose-Capillary 108 (H) 65 - 99 mg/dL  Glucose, capillary     Status: Abnormal   Collection Time: 03/29/16 11:33 PM  Result Value Ref Range   Glucose-Capillary 112 (H) 65 - 99 mg/dL  Basic metabolic panel     Status: Abnormal   Collection Time: 03/30/16  3:03 AM  Result Value Ref Range   Sodium 140 135 - 145 mmol/L   Potassium 3.5 3.5 - 5.1 mmol/L   Chloride 105 101 - 111 mmol/L   CO2 21 (L) 22 - 32 mmol/L   Glucose, Bld 123 (H) 65 - 99 mg/dL   BUN 18 6 - 20 mg/dL   Creatinine, Ser 8.11 0.61 - 1.24 mg/dL   Calcium 91.4 8.9 - 78.2 mg/dL   GFR calc non Af Amer >60 >60 mL/min   GFR calc Af Amer >60 >60 mL/min   Anion gap 14 5 - 15  CBC     Status: Abnormal   Collection Time: 03/30/16  3:03 AM  Result Value Ref Range   WBC 11.0 (H) 4.0 - 10.5 K/uL   RBC 5.02 4.22 - 5.81 MIL/uL   Hemoglobin 13.9 13.0 - 17.0 g/dL   HCT 95.6 21.3 - 08.6 %   MCV 82.3 78.0 - 100.0 fL   MCH 27.7 26.0 - 34.0 pg   MCHC 33.7 30.0 - 36.0 g/dL   RDW 57.8 46.9 - 62.9 %   Platelets 302 150 - 400 K/uL  Magnesium     Status: None   Collection Time: 03/30/16  3:03 AM  Result Value Ref  Range   Magnesium 2.2 1.7 - 2.4 mg/dL  Phosphorus     Status: None   Collection Time: 03/30/16  3:03 AM  Result Value Ref Range   Phosphorus 3.7 2.5 - 4.6 mg/dL  Glucose, capillary     Status: Abnormal   Collection Time: 03/30/16  3:24 AM  Result Value Ref Range   Glucose-Capillary 122 (H) 65 - 99 mg/dL  Glucose, capillary     Status: Abnormal   Collection Time: 03/30/16  8:34 AM  Result Value Ref Range  Glucose-Capillary 122 (H) 65 - 99 mg/dL      Imaging Results (Last 48 hours)  Ct Head Wo Contrast  Result Date: 03/29/2016 CLINICAL DATA:  66 y/o  M; status post cardiac arrest with headache. EXAM: CT HEAD WITHOUT CONTRAST TECHNIQUE: Contiguous axial images were obtained from the base of the skull through the vertex without intravenous contrast. COMPARISON:  03/24/2016 MRI of the brain. 03/23/2016 CT of the head. FINDINGS: Brain: Stable subacute hematoma within the right frontal lobe. Stable chronic right anterior superior and right lateral frontal infarcts. Stable chronic lacunar infarct in the right basal ganglia. No evidence for new large acute territory infarct, focal mass effect, or intracranial hemorrhage. No hydrocephalus or extra-axial collection. Stable ex vacuo dilatation of frontal horn of right lateral ventricle. Mild brain parenchymal volume loss. Vascular: No hyperdense vessel. Calcific atherosclerosis of cavernous internal carotid arteries. Skull: Normal. Negative for fracture or focal lesion. Sinuses/Orbits: Mild mucosal thickening within sphenoid and maxillary sinuses. Normal pneumatization of mastoid air cells. Opacification and external auditory canals is likely cerumen. Normal orbits. Other: None. IMPRESSION: 1. No acute intracranial abnormality is identified. 2. Stable subacute hematoma within the right frontal lobe. 3. Stable right frontal lobe and right basal ganglia chronic infarcts. 4. Mild paranasal sinus disease. Electronically Signed   By: Mitzi Hansen M.D.   On: 03/29/2016 15:00     Assessment/Plan: Diagnosis: Anoxic BI Labs and images independently reviewed.  Records reviewed and summated above.             NeuroPsych evaluation for behavorial assessment.             Provide environmental management by reducing the level of stimulation, tolerating restlessness when possible, protecting patient from harming self or others and reducing patient's cognitive confusion.             Address behavioral concerns include providing structured environments and daily routines.             Cognitive therapy to direct modular abilities in order to maintain goals        including problem solving, self regulation/monitoring, self management, attention, and memory.             Fall precautions; pt at risk for second impact syndrome             Prevention of secondary injury: monitor for hypotension, hypoxia, seizures or signs of increased ICP             Prophylactic AED:              Avoid medications that could impair cognitive abilities, such as anticholinergics, antihistaminic, benzodiazapines, narcotics, etc when possible  1. Does the need for close, 24 hr/day medical supervision in concert with the patient's rehab needs make it unreasonable for this patient to be served in a less intensive setting? Yes  2. 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 (CIWA, cont monitor), history of hemorrhagic CVA with residual deficits, polysubstance abuse (counsel when appropriate), tachypnea (monitor RR and O2 Sats with increased physical exertion, HTN (monitor and provide prns in accordance with increased physical exertion and pain), hyperglycemia (cont to monitor, consider meds if necessary), pain (Biofeedback training with therapies to help reduce reliance on opiate pain medications, particularly IV fentanly, monitor pain control during therapies, and  sedation at rest and titrate to maximum efficacy to ensure participation and gains in therapies), leukocytosis (cont to monitor for signs and  symptoms of infection, further workup if indicated) 3. Due to bladder management, bowel management, safety, disease management, medication administration, pain management and patient education, does the patient require 24 hr/day rehab nursing? Yes 4. Does the patient require coordinated care of a physician, rehab nurse, PT (1-2 hrs/day, 5 days/week), OT (1-2 hrs/day, 5 days/week) and SLP (1-2 hrs/day, 5 days/week) to address physical and functional deficits in the context of the above medical diagnosis(es)? Yes Addressing deficits in the following areas: balance, endurance, locomotion, strength, transferring, bowel/bladder control, bathing, dressing, feeding, grooming, toileting, cognition, speech, swallowing and psychosocial support 5. Can the patient actively participate in an intensive therapy program of at least 3 hrs of therapy per day at least 5 days per week? Potentially 6. The potential for patient to make measurable gains while on inpatient rehab is excellent 7. Anticipated functional outcomes upon discharge from inpatient rehab are modified independent and supervision  with PT, modified independent and supervision with OT, supervision and min assist with SLP. 8. Estimated rehab length of stay to reach the above functional goals is: 15-18 days. 9. Does the patient have adequate social supports and living environment to accommodate these discharge functional goals? Yes 10. Anticipated D/C setting: Home 11. Anticipated post D/C treatments: HH therapy and Home excercise program 12. Overall Rehab/Functional Prognosis: fair  RECOMMENDATIONS: This patient's condition is appropriate for continued rehabilitative care in the following setting: CIR when pt able to tolerate. Patient has agreed to participate in recommended program. Potentially Note that  insurance prior authorization may be required for reimbursement for recommended care.  Comment: Rehab Admissions Coordinator to follow up.  Jerene PitchLove, Pamela S, PA-C 03/30/2016  Maryla MorrowAnkit Patel, MD, FAAPMR     Revision History                        Routing History

## 2016-03-31 NOTE — PMR Pre-admission (Signed)
PMR Admission Coordinator Pre-Admission Assessment Patient: Randall Patel is an 65 y.o., male MRN: [id number] DOB: [birth year] Height: 5\' 6"  (167.6 cm) Weight: 68.1 kg (150 lb 1.6 oz)              Insurance Information HMO: yes    PPO:      PCP:      IPA:      80/20:     OTHER: medicare advantage plan PRIMARY: [employer]      Policy#: [id number]      Subscriber: pt CM Name: Sena Hitch      Phone#: [phone] ext [phone]     Fax#: [phone] Pre-Cert#: [id number] approved for 7 days; first review after initial conference. F/u CM Lyman Bishop phone [phone] fax; [phone]      Employer: disabled Benefits:  Phone #: [phone]     Name: 03/30/2016 Eff. Date: 02/11/16     Deduct: none      Out of Pocket Max: [phone]      Life Max: none CIR: $500 co pay per day days 1-3 then covers 100%      SNF: no co pay days 1020; $164.50 co pay per day days 21-100 Outpatient: 80%     Co-Pay: visits per medical necessity Home Health: 100%      Co-Pay: visits per medical necessity DME: 80%     Co-Pay: 20% Providers: in network  SECONDARY: Medicaid of [place]      Policy#: [id number]      Subscriber: pt  Medicaid Application Date:       Case Manager:  Disability Application Date:       Case Worker:   Emergency Contact Information Contact Information    Name Relation Home Work Mobile   Silver Firs Sister [phone]  [phone]   Nayel, Warncke   [phone]   [name]   [phone]   Avonte, Dieleman   [phone]     Current Medical History  Patient Admitting Diagnosis: anoxic BI  History of Present Illness: HPI: Randall Patel is a 65 y.o. 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 EMS found patient unresponsive on the ground with blood sputum but with pulse. He went cardiac arrest--PEA treated with CPR x 6 minutes and epinephrine with ROSC.  He was acidotic at admission and intubated and started on IV antibiotics. UDS positive for benzos and THC.  MRI brain revealed subacute hematoma right frontal lobe--31 mm. Cardiology consulted and elevated cardiac enzymes felt to be due to demand ischemia in setting of cardiac arrest.  2  D echo with EF 45-50% with hypokinesis of inferior and inferoseptal myocardium and moderately increased pulmonary pressures. EEG with suggestion of global anoxic injury and felt that patient with asymptomatic ICH with incidental old finding from prior hemorrhage 03/2015. Right posterior frontal hematoma with old right anterior frontal infarct and to continue ASA. Encephalopathy felt to be due to ABI. He has had issues with agitation requiring precedex and pulmonary edema treated with diuresis. Significant agitation with tachycardia treated with BB and elevated HTN being managed with titration of meds.  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  Past Medical History  Past Medical History:  Diagnosis Date  . CAD (coronary artery disease)    a. prior MI in 2006 b. 03/2012: 30% ostial LM stenosis,  30% pLAD stenosis, 60% mid-LADm 30% pCx, 3rd OM which was small in size with diffuse 90% stenosis involving both branches, and RCA with 50% mid-stenosis and 100% distal occlusion. POBA to PL branch and DES placement to dRCA.  [name] History of stroke   . Ischemic cardiomyopathy    a. EF 40-45% by echo in 03/2015    Family History  family history includes Heart attack in his father; Hypertension in his father.  Prior Rehab/Hospitalizations:  Has the patient had major surgery during 100 days prior to admission? No CIR 03/2015 for 15 days and d/c'd home with sister, [name] at supervision level  Current Medications   Current Facility-Administered Medications:  .  acetaminophen (TYLENOL) tablet 650 mg, 650 mg, Oral, Q6H  PRN, Tobey Grim, NP .  amLODipine (NORVASC) tablet 10 mg, 10 mg, Oral, Daily, Storm Frisk, MD, 10 mg at 03/31/16 4098 .  feeding supplement (BOOST / RESOURCE BREEZE) liquid 1 Container, 1 Container, Oral, TID BM, Alyson Reedy, MD, 1 Container at 03/31/16 [phone] .  fentaNYL (SUBLIMAZE) injection 25 mcg, 25 mcg, Intravenous, Q2H PRN, Coralyn Helling, MD, 25 mcg at 03/30/16 4782 .  fluticasone (FLONASE) 50 MCG/ACT nasal spray 2 spray, 2 spray, Each Nare, Daily, Kalman Shan, MD, 2 spray at 03/31/16 0820 .  folic acid (FOLVITE) tablet 1 mg, 1 mg, Oral, Daily, Tobey Grim, NP, 1 mg at 03/31/16 0819 .  guaiFENesin (ROBITUSSIN) 100 MG/5ML solution 100 mg, 5 mL, Oral, Q4H PRN, Tobey Grim, NP, 100 mg at 03/29/16 1529 .  hydrALAZINE (APRESOLINE) injection 10-40 mg, 10-40 mg, Intravenous, Q4H PRN, Oretha Milch, MD, 20 mg at 03/30/16 1136 .  HYDROcodone-acetaminophen (NORCO/VICODIN) 5-325 MG per tablet 1-2 tablet, 1-2 tablet, Oral, Q4H PRN, Tobey Grim, NP, 1 tablet at 03/31/16 0818 .  Influenza vac split quadrivalent PF (FLUARIX) injection 0.5 mL, 0.5 mL, Intramuscular, Prior to discharge, Coralyn Helling, MD .  ipratropium-albuterol (DUONEB) 0.5-2.5 (3) MG/3ML nebulizer solution 3 mL, 3 mL, Nebulization, Q6H PRN, Alyson Reedy, MD .  LORazepam (ATIVAN) injection 1 mg, 1 mg, Intravenous, Q4H PRN, Coralyn Helling, MD, 1 mg at 03/28/16 0225 .  magnesium sulfate IVPB 2 g 50 mL, 2 g, Intravenous, Once, Kalman Shan, MD .  MEDLINE mouth rinse, 15 mL, Mouth Rinse, BID, Alyson Reedy, MD, 15 mL at 03/31/16 0820 .  metoprolol (LOPRESSOR) injection 5 mg, 5 mg, Intravenous, Q4H PRN, Tobey Grim, NP, 5 mg at 03/31/16 9562 .  metoprolol (LOPRESSOR) tablet 100 mg, 100 mg, Oral, BID, Storm Frisk, MD, 100 mg at 03/31/16 0818 .  multivitamin with minerals tablet 1 tablet, 1 tablet, Oral, Daily, Tobey Grim, NP, 1 tablet at 03/31/16 0818 .  ondansetron (ZOFRAN) injection 4  mg, 4 mg, Intravenous, Q6H PRN, Alyson Reedy, MD, 4 mg at 03/26/16 0452 .  pneumococcal 23 valent vaccine (PNU-IMMUNE) injection 0.5 mL, 0.5 mL, Intramuscular, Prior to discharge, Coralyn Helling, MD .  spironolactone (ALDACTONE) tablet 25 mg, 25 mg, Oral, Daily, Tobey Grim, NP, 25 mg at 03/31/16 0819 .  thiamine (VITAMIN B-1) tablet 100 mg, 100 mg, Oral, Daily, Tobey Grim, NP, 100 mg at 03/31/16 1308  Patients Current Diet: DIET DYS 2 Room service appropriate? Yes; Fluid consistency: Thin  Precautions / Restrictions Precautions Precautions: Fall Restrictions Weight Bearing Restrictions: No   Has the patient had 2 or more falls or a fall with injury in the past year?No  Prior Activity Level Patient  Mod I with cane pta, does not drive, independent with all adls  Home Assistive Devices / Equipment Home Assistive Devices/Equipment: None  Prior Device Use: Indicate devices/aids used by the patient prior to current illness, exacerbation or injury? cane  Prior Functional Level Prior Function Comments: Pt unable to provide info   Self Care: Did the patient need help bathing, dressing, using the toilet or eating?  Independent  Indoor Mobility: Did the patient need assistance with walking from room to room (with or without device)? Independent  Stairs: Did the patient need assistance with internal or external stairs (with or without device)? Independent  Functional Cognition: Did the patient need help planning regular tasks such as shopping or remembering to take medications? Independent  Current Functional Level Cognition  Overall Cognitive Status: No family/caregiver present to determine baseline cognitive functioning Current Attention Level: Sustained Orientation Level: Oriented X4 Following Commands: Follows one step commands consistently Safety/Judgement: Decreased awareness of safety, Decreased awareness of deficits General Comments: Pt perseverates that he is on  the back porch or in a kitchen - intermittently he is aware that he is in the hospital.  He requires mod cues for problem solving during simple ADL tasks     Extremity Assessment (includes Sensation/Coordination)  Upper Extremity Assessment: RUE deficits/detail, LUE deficits/detail RUE Deficits / Details: Pt with mild spasticity noted, but able to use Rt UE functionally during ADL tasks  LUE Deficits / Details: Pt with mild spasticity.  He is able to move through 3/4 ROM to touch his mouth.  He is able to elevate shoulder to ~100* shoulder flexion.  Movement moving out of synergy patterns.  Unable to reach with Lt hand as he has difficulty extending digits with UE in flexion  (tremor noted ) LUE Coordination: decreased fine motor, decreased gross motor  Lower Extremity Assessment: Defer to PT evaluation RLE Deficits / Details: Moving hip knee and ankle actively and against gravity RLE Coordination: decreased gross motor, decreased fine motor LLE Deficits / Details: Moving hip knee and ankle actively and against gravity LLE Coordination: decreased fine motor, decreased gross motor    ADLs  Overall ADL's : Needs assistance/impaired Eating/Feeding: Set up, Supervision/ safety, Bed level Grooming: Wash/dry hands, Wash/dry face, Oral care, Brushing hair, Minimal assistance, Sitting, Bed level Upper Body Bathing: Moderate assistance, Sitting, Bed level Upper Body Bathing Details (indicate cue type and reason): assist for Rt UE and chest  Lower Body Bathing: Total assistance, Sit to/from stand Lower Body Bathing Details (indicate cue type and reason): Able to bathe thighs, but requires assist for balance  Upper Body Dressing : Maximal assistance, Sitting Lower Body Dressing: Total assistance, Sit to/from stand Toilet Transfer: Moderate assistance, Stand-pivot, BSC Toilet Transfer Details (indicate cue type and reason): pt with posterior lean, and assist to guide hips and maintain balance   Toileting- Clothing Manipulation and Hygiene: Total assistance, Sit to/from stand Functional mobility during ADLs: Moderate assistance    Mobility  Overal bed mobility: Needs Assistance Bed Mobility: Supine to Sit Supine to sit: Mod assist Sit to supine: Mod assist General bed mobility comments: Assist to bring legs off bed and to elevate trunk into sitting    Transfers  Overall transfer level: Needs assistance Equipment used: 1 person hand held assist Transfers: Sit to/from Stand Sit to Stand: Mod assist Stand pivot transfers: Mod assist General transfer comment: Assist to bring hips up and for balance. Pt with posterior bias.    Ambulation / Gait / Stairs / Psychologist, prison and probation servicesWheelchair Mobility  Ambulation/Gait Ambulation/Gait assistance: +2 safety/equipment, Mod assist Ambulation Distance (Feet): 75 Feet Assistive device: 1 person hand held assist Gait Pattern/deviations: Step-through pattern, Decreased step length - right, Decreased step length - left, Decreased dorsiflexion - left, Drifts right/left, Leaning posteriorly General Gait Details: Assist for balance and support. Pt with posterior lean that he is unable to correct with verbal and tactile cues. Followed with chair. Gait velocity: decr Gait velocity interpretation: Below normal speed for age/gender    Posture / Balance Dynamic Sitting Balance Sitting balance - Comments: Sat EOB with min guard Balance Overall balance assessment: Needs assistance Sitting-balance support: Feet supported, Bilateral upper extremity supported Sitting balance-Leahy Scale: Poor Sitting balance - Comments: Sat EOB with min guard Postural control: Posterior lean, Right lateral lean, Left lateral lean Standing balance support: Single extremity supported Standing balance-Leahy Scale: Poor Standing balance comment: hand held and min to mod A due to posterior lean    Special needs/care consideration BiPAP/CPAP  N/a CPM  N/a Continuous Drip IV  N/a Dialysis   N/a Life Vest  N/a Oxygen  N/a Special Bed  N/a Trach Size  N/a Wound Vac (area)  N/a Skin sacral foam Bowel mgmt: continent Bladder mgmt: incontinent Diabetic mgmt  N/a   Previous Home Environment Living Arrangements: Other relatives (lives with his sister, [name] for years)  Lives With:  (sister, Visual merchandiser) Available Help at Discharge: Family, Available 24 hours/day [name] availble 24/7) Type of Home: House Home Layout: One level Home Access: Stairs to enter Entrance Stairs-Rails: Right, Left, Can reach both Entrance Stairs-Number of Steps: 4 Bathroom Shower/Tub: Tub/shower unit, Engineer, building services: Standard Bathroom Accessibility: Yes How Accessible: Accessible via walker Home Care Services: No  Discharge Living Setting Plans for Discharge Living Setting: Patient's home, Lives with (comment) (pt lives with his sister, [name]) Type of Home at Discharge: House Discharge Home Layout: One level Discharge Home Access: Stairs to enter Entrance Stairs-Rails: Right, Left, Can reach both Entrance Stairs-Number of Steps: 4 Discharge Bathroom Shower/Tub: Tub/shower unit, Curtain Discharge Bathroom Toilet: Standard Discharge Bathroom Accessibility: Yes How Accessible: Accessible via walker Does the patient have any problems obtaining your medications?: No  Social/Family/Support Systems Patient Roles: Parent (HIs son, [place], lives in [place]) Solicitor Information: [name], sister Anticipated Caregiver: sister Anticipated Industrial/product designer Information: see above Ability/Limitations of Caregiver: no limitations Caregiver Availability: 24/7 Discharge Plan Discussed with Primary Caregiver: Yes Is Caregiver In Agreement with Plan?: Yes Does Caregiver/Family have Issues with Lodging/Transportation while Pt is in Rehab?: No  Goals/Additional Needs Patient/Family Goal for Rehab: supervision with PT, OT, and SLP Expected length of stay: ELOS 10- 14 days Pt/Family Agrees to  Admission and willing to participate: Yes Program Orientation Provided & Reviewed with Pt/Caregiver Including Roles  & Responsibilities: Yes  Decrease burden of Care through IP rehab admission: n/a   Possible need for SNF placement upon discharge: not anticipated  Patient Condition: This patient's condition remains as documented in the consult dated 03/30/2016, in which the Rehabilitation Physician determined and documented that the patient's condition is appropriate for intensive rehabilitative care in an inpatient rehabilitation facility. Will admit to inpatient rehab today.   Preadmission Screen Completed By:  Clois Dupes, 03/31/2016 3:25 PM ______________________________________________________________________   Discussed status with Dr. Allena Katz on 03/31/2016 at  1537 and received telephone approval for admission today.  Admission Coordinator:  Clois Dupes, time 2956 Date

## 2016-03-31 NOTE — Care Management Note (Signed)
Case Management Note  Patient Details  Name: Randall Patel MRN: [id number] Date of Birth: August 07, 1950  Subjective/Objective: Pt presented due to cardiac arrest. Pt with hx of CAD, acute respiratory failure and CVA. Plan will be for admission to CIR 03-31-16.                     Action/Plan: Rehab Admission Coordinator [facility] received insurance approval. Plan for d/c to CIR today. No further needs from CM at this time.   Expected Discharge Date:                  Expected Discharge Plan:  IP Rehab Facility  In-House Referral:  Clinical Social Work  Discharge planning Services  CM Consult  Post Acute Care Choice:  NA Choice offered to:  NA  DME Arranged:  N/A DME Agency:  NA  HH Arranged:  NA HH Agency:  NA  Status of Service:  Completed, signed off  If discussed at [employer] of Stay Meetings, dates discussed:    Additional Comments:  Gala Lewandowsky, RN 03/31/2016, 3:11 PM

## 2016-03-31 NOTE — Care Management Important Message (Signed)
Important Message  Patient Details  Name: Sincer Collie MRN: [id number] Date of Birth: Feb 18, 1951   Medicare Important Message Given:  Yes    Kyla Balzarine 03/31/2016, 10:26 AM

## 2016-03-31 NOTE — Progress Notes (Signed)
Patient Name: Randall Patel Date of Encounter: 03/31/2016  Primary Cardiologist: Dr. Geronimo Boot Problem List     Active Problems:   Cardiac arrest [facility])   Acute pulmonary edema (HCC)   Respiratory failure (HCC)   Sinus tachycardia   Abnormal CT of brain   Coronary artery disease involving native coronary artery of native heart without angina pectoris   ETOH abuse   Tachypnea   Benign essential HTN   Hyperglycemia   Pain   Leukocytosis   Patient Profile     65 year old with cardiorespiratory arrest (PEA) with known CAD DES to dRCA, prior EF 45%, mildly elevated flat troponin, acute respiratory failure, ETOH heavy use, subacute hematoma of right posterior frontal lobe. Hx of prior CVA 12/17 Echo EF 45-50%    Subjective   Complains of a headache  Inpatient Medications    Scheduled Meds: . amLODipine  10 mg Oral Daily  . feeding supplement  1 Container Oral TID BM  . fluticasone  2 spray Each Nare Daily  . folic acid  1 mg Oral Daily  . magnesium sulfate 1 - 4 g bolus IVPB  2 g Intravenous Once  . mouth rinse  15 mL Mouth Rinse BID  . metoprolol tartrate  100 mg Oral BID  . multivitamin with minerals  1 tablet Oral Daily  . spironolactone  25 mg Oral Daily  . thiamine  100 mg Oral Daily   Continuous Infusions:  PRN Meds: acetaminophen, fentaNYL (SUBLIMAZE) injection, guaiFENesin, hydrALAZINE, HYDROcodone-acetaminophen, Influenza vac split quadrivalent PF, ipratropium-albuterol, LORazepam, metoprolol, ondansetron (ZOFRAN) IV, pneumococcal 23 valent vaccine   Vital Signs    Vitals:   03/31/16 0422 03/31/16 0536 03/31/16 0622 03/31/16 0646  BP:  (!) 169/88 (!) 172/95 (!) 137/94  Pulse: 88 (!) 57 75 (!) 52  Resp: 14 16 13  (!) 27  Temp:  98.8 F (37.1 C)    TempSrc:  Oral    SpO2: 98% 97% 98% 97%  Weight:  68.1 kg (150 lb 1.6 oz)    Height:        Intake/Output Summary (Last 24 hours) at 03/31/16 0955 Last data filed at 03/31/16 0400  Gross per  24 hour  Intake              840 ml  Output              775 ml  Net               65 ml   Filed Weights   03/29/16 0600 03/30/16 0325 03/31/16 0536  Weight: 62.8 kg (138 lb 7.2 oz) 62.6 kg (138 lb 0.1 oz) 68.1 kg (150 lb 1.6 oz)    Physical Exam    GEN: Sedate thin,ill appearing.somnolent   HEENT: Grossly normal.  Neck: Supple, no JVD, carotid bruits, or masses. Cardiac: Tachy RR, no murmurs, rubs, or gallops. No clubbing, cyanosis, edema.  Radials/DP/PT 2+ and equal bilaterally.  Respiratory:  Respirations regular and unlabored, clear to auscultation bilaterally. GI: Soft, nontender, nondistended, BS + x 4. MS: no deformity or atrophy. Skin: warm and dry, no rash. Neuro:  Strength and sensation are intact. Psych: more  responsive   Labs    CBC  Recent Labs  03/29/16 0408 03/30/16 0303  WBC 10.2 11.0*  HGB 13.4 13.9  HCT 40.8 41.3  MCV 84.5 82.3  PLT 224 302   Basic Metabolic Panel  Recent Labs  03/29/16 0330 03/29/16 1157 03/30/16 0303  NA 137  --  140  K 3.4*  --  3.5  CL 104  --  105  CO2 21*  --  21*  GLUCOSE 118*  --  123*  BUN 13  --  18  CREATININE 0.77  --  0.71  CALCIUM 10.0  --  10.1  MG  --  2.6* 2.2  PHOS  --  3.6 3.7   Liver Function Tests No results for input(s): AST, ALT, ALKPHOS, BILITOT, PROT, ALBUMIN in the last 72 hours. No results for input(s): LIPASE, AMYLASE in the last 72 hours. Cardiac Enzymes No results for input(s): CKTOTAL, CKMB, CKMBINDEX, TROPONINI in the last 72 hours. BNP Invalid input(s): POCBNP D-Dimer No results for input(s): DDIMER in the last 72 hours. Hemoglobin A1C No results for input(s): HGBA1C in the last 72 hours. Fasting Lipid Panel No results for input(s): CHOL, HDL, LDLCALC, TRIG, CHOLHDL, LDLDIRECT in the last 72 hours. Thyroid Function Tests No results for input(s): TSH, T4TOTAL, T3FREE, THYROIDAB in the last 72 hours.  Invalid input(s): FREET3  Telemetry    Telemetry Personally reviewed :  NSR  at 76  ECG     n/a   Radiology    Ct Head Wo Contrast  Result Date: 03/29/2016 CLINICAL DATA:  65 y/o  M; status post cardiac arrest with headache. EXAM: CT HEAD WITHOUT CONTRAST TECHNIQUE: Contiguous axial images were obtained from the base of the skull through the vertex without intravenous contrast. COMPARISON:  03/24/2016 MRI of the brain. 03/23/2016 CT of the head. FINDINGS: Brain: Stable subacute hematoma within the right frontal lobe. Stable chronic right anterior superior and right lateral frontal infarcts. Stable chronic lacunar infarct in the right basal ganglia. No evidence for new large acute territory infarct, focal mass effect, or intracranial hemorrhage. No hydrocephalus or extra-axial collection. Stable ex vacuo dilatation of frontal horn of right lateral ventricle. Mild brain parenchymal volume loss. Vascular: No hyperdense vessel. Calcific atherosclerosis of cavernous internal carotid arteries. Skull: Normal. Negative for fracture or focal lesion. Sinuses/Orbits: Mild mucosal thickening within sphenoid and maxillary sinuses. Normal pneumatization of mastoid air cells. Opacification and external auditory canals is likely cerumen. Normal orbits. Other: None. IMPRESSION: 1. No acute intracranial abnormality is identified. 2. Stable subacute hematoma within the right frontal lobe. 3. Stable right frontal lobe and right basal ganglia chronic infarcts. 4. Mild paranasal sinus disease. Electronically Signed   By: Mitzi Hansen M.D.   On: 03/29/2016 15:00    Cardiac Studies   Cath 2013  1. NSTEMI 2. Triple vessel CAD with occluded RCA, culprit vessel. 3. Successful PTCA/DES x 1 distal RCA 4. Successful PTCA with balloon angioplasty only of the posterolateral branch.  5. Segmental LV systolic dysfunction   Assessment & Plan    1. Cardiac Arrest - initially reported acute dyspnea but arrested upon EMS arrival. Initially in PEA then Asystole.  presemably this was a  primary resp event   2. CAD - cath in 03/2012 showed 30% ostial LM stenosis, 30% pLAD stenosis, 60% mid-LADm 30% pCx, 3rd OM which was small in size with diffuse 90% stenosis involving both branches, and RCA with 50% mid-stenosis and 100% distal occlusion. This was treated with DES x1 to the dRCA with successful PTCA with balloon angioplasty only of the posterolateral branch being perfomed.   Cath ( intervention) precluded 2/2 hematoma On metoprolol , starting losartan     3. Nonischemic Cardiomyopathy - EF 40-45% by echocardiogram in 03/2015.  On metoprolol Starting Aldactone today  Will  add Losartan 50 mg a day  4. Elevated Troponin - cyclic troponin values at 0.04, 0.13, 0.21, 0.24, and 0.21. Likely secondary to demand ischemia in the setting of his cardiac arrest.  5. Respiratory Failure with Pulmonary Edema and Hemoptysis  Per CCM    6. Subacute Hematoma -complains of a head ache .   MRI of brain this admission shows subacute hematoma in the right posterior frontal lobe measuring up to 31 mm.Per Neuro notes not felt to be related to acute trauma or presentation with encephalopathy -   He is gradually improving   We will sign off. Call for questions Follow up with Dr Clifton JamesMcAlhany     Signed, Kristeen MissPhilip Nahser, MD  03/31/2016, 9:55 AM

## 2016-03-31 NOTE — Progress Notes (Signed)
I spoke with pt's sister by phone and she confirms that she can provide 24/7 supervision of pt after d/c like she did last year after his CIR stay. I now await insurance approval to admit. [phone]

## 2016-03-31 NOTE — H&P (Addendum)
Physical Medicine and Rehabilitation Admission H&P    Chief Complaint  Patient presents with  . Anoxic brain injury    Due to cardiac arrest    HPI:  Randall Patel is a 65 y.o. 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 EMS found patient unresponsive on the ground with blood sputum but with pulse. He went cardiac arrest--PEA treated with CPR x 6 minutes and epinephrine with ROSC. He was acidotic at admission and intubated and started on IV antibiotics. UDS positive for benzos and THC.  MRI brain revealed subacute hematoma right frontal lobe--31 mm. Cardiology consulted and elevated cardiac enzymes felt to be due to demand ischemia in setting of cardiac arrest.  2  D echo with EF 45-50% with hypokinesis of inferior and inferoseptal myocardium and moderately increased pulmonary pressures. Cath precluded due to hematoma and cardiology recommended medical management.  EEG showed suggestion of global anoxic injury and Dr. Leonie Man felt that patient with asymptomatic ICH with incidental old finding from prior right posterior frontal hematoma with old right anterior frontal infarct from 03/2015.  Encephalopathy felt to be due to ABI and agitation has resolved. Acute pulmonary edema treated with diuresis and . elevated HTN was managed with titration to oral meds.  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 CIR was recommended for follow up therapy.    Review of Systems  HENT: Positive for hearing loss. Negative for tinnitus.   Eyes: Positive for blurred vision (needs glasses). Negative for double vision.  Respiratory: Positive for cough (occasional dry cough). Negative for shortness of breath and wheezing.   Cardiovascular: Negative for chest pain, palpitations and leg swelling.    Gastrointestinal: Negative for abdominal pain, heartburn and nausea.  Genitourinary: Positive for frequency.  Musculoskeletal: Negative for myalgias and neck pain.  Skin: Negative for itching and rash.  Neurological: Positive for focal weakness.  Psychiatric/Behavioral: Positive for memory loss.  All other systems reviewed and are negative.     Past Medical History:  Diagnosis Date  . CAD (coronary artery disease)    a. prior MI in 2006 b. 03/2012: 30% ostial LM stenosis, 30% pLAD stenosis, 60% mid-LADm 30% pCx, 3rd OM which was small in size with diffuse 90% stenosis involving both branches, and RCA with 50% mid-stenosis and 100% distal occlusion. POBA to PL branch and DES placement to dRCA.  [name] ETOH abuse   . History of stroke 03/2015   right frontal hemorrhage  . HTN (hypertension)   . Ischemic cardiomyopathy    a. EF 40-45% by echo in 03/2015  . Tobacco abuse   . Withdrawal seizures [facility])     Past Surgical History:  Procedure Laterality Date  . CORONARY ANGIOPLASTY WITH STENT PLACEMENT  03/2012  . HERNIA REPAIR        Family History  Problem Relation Age of Onset  . Heart attack Father   . Hypertension Father   . Cancer Mother   . Stroke Sister     Social History:  Lives with sister (disabled and helps provide supervision).  Was independent without AD prior to admission. He reports that he has been smoking --1 PPD.  He has a 40.00 pack-year smoking history. He has never used smokeless tobacco. He reports that he drinks alcohol--6-12 pack beer and liquor "when he can get  it".  Per reports that he uses drugs, including Marijuana.    Allergies  Allergen Reactions  . Lisinopril Swelling    This allergy is noted from previous electronic record; however, pt. Is unable to confirm this allergy at this time  . Other     Son states he is allergic to an antibiotics  Not sur which one. Also pharmacy has no records of any allergies     Medications Prior to Admission   Medication Sig Dispense Refill  . aspirin 81 MG chewable tablet Chew by mouth daily.      Home: Home Living Family/patient expects to be discharged to:: Inpatient rehab Living Arrangements: Other relatives (lives with his sister, Starleen Blue for years) Available Help at Discharge: Family, Available 24 hours/day Starleen Blue availble 24/7) Type of Home: House Home Access: Stairs to enter [employer] of Steps: 4 Entrance Stairs-Rails: Right, Left, Can reach both Home Layout: One level Bathroom Shower/Tub: Tub/shower unit, Architectural technologist: Standard Bathroom Accessibility: Yes  Lives With:  (sister, Starleen Blue)   Functional History: Prior Function Comments: Pt unable to provide info   Functional Status:  Mobility: Bed Mobility Overal bed mobility: Needs Assistance Bed Mobility: Supine to Sit Supine to sit: Mod assist Sit to supine: Mod assist General bed mobility comments: Assist to bring legs off bed and to elevate trunk into sitting Transfers Overall transfer level: Needs assistance Equipment used: 1 person hand held assist Transfers: Sit to/from Stand Sit to Stand: Mod assist Stand pivot transfers: Mod assist General transfer comment: Assist to bring hips up and for balance. Pt with posterior bias. Ambulation/Gait Ambulation/Gait assistance: +2 safety/equipment, Mod assist Ambulation Distance (Feet): 75 Feet Assistive device: 1 person hand held assist Gait Pattern/deviations: Step-through pattern, Decreased step length - right, Decreased step length - left, Decreased dorsiflexion - left, Drifts right/left, Leaning posteriorly General Gait Details: Assist for balance and support. Pt with posterior lean that he is unable to correct with verbal and tactile cues. Followed with chair. Gait velocity: decr Gait velocity interpretation: Below normal speed for age/gender    ADL: ADL Overall ADL's : Needs assistance/impaired Eating/Feeding: Set up, Supervision/ safety,  Bed level Grooming: Wash/dry hands, Wash/dry face, Oral care, Brushing hair, Minimal assistance, Sitting, Bed level Upper Body Bathing: Moderate assistance, Sitting, Bed level Upper Body Bathing Details (indicate cue type and reason): assist for Rt UE and chest  Lower Body Bathing: Total assistance, Sit to/from stand Lower Body Bathing Details (indicate cue type and reason): Able to bathe thighs, but requires assist for balance  Upper Body Dressing : Maximal assistance, Sitting Lower Body Dressing: Total assistance, Sit to/from stand Toilet Transfer: Moderate assistance, Stand-pivot, BSC Toilet Transfer Details (indicate cue type and reason): pt with posterior lean, and assist to guide hips and maintain balance  Toileting- Clothing Manipulation and Hygiene: Total assistance, Sit to/from stand Functional mobility during ADLs: Moderate assistance  Cognition: Cognition Overall Cognitive Status: No family/caregiver present to determine baseline cognitive functioning Orientation Level: Oriented X4 Cognition Arousal/Alertness: Awake/alert Behavior During Therapy: Flat affect Overall Cognitive Status: No family/caregiver present to determine baseline cognitive functioning Area of Impairment: Memory, Safety/judgement, Problem solving, Attention, Orientation Orientation Level: Disoriented to, Situation Current Attention Level: Sustained Memory: Decreased short-term memory Following Commands: Follows one step commands consistently Safety/Judgement: Decreased awareness of safety, Decreased awareness of deficits Awareness: Intellectual Problem Solving: Slow processing, Decreased initiation, Requires verbal cues, Requires tactile cues General Comments: Pt perseverates that he is on the back porch or in a kitchen - intermittently he  is aware that he is in the hospital.  He requires mod cues for problem solving during simple ADL tasks     Blood pressure (!) 159/85, pulse (!) 51, temperature 98.4 F  (36.9 C), temperature source Oral, resp. rate (!) 26, height '5\' 6"'  (1.676 m), weight 68.1 kg (150 lb 1.6 oz), SpO2 100 %. Physical Exam  Nursing note and vitals reviewed. Constitutional: He is oriented to person, place, and time. He appears well-developed and well-nourished.  HENT:  Head: Normocephalic and atraumatic.  Eyes: Pupils are equal, round, and reactive to light. Right conjunctiva is injected. Left conjunctiva is injected.  Neck: Normal range of motion. Neck supple.  Cardiovascular: Normal rate and regular rhythm.   No murmur heard. Respiratory: Effort normal and breath sounds normal. No stridor. No respiratory distress. He has no wheezes.  GI: Soft. Bowel sounds are normal. He exhibits no distension. There is no tenderness.  Musculoskeletal: He exhibits no edema.  Neurological: He is alert and oriented to person, place, and time.  Slumped to the left with inability to self correct without cues.  Right inattention needing cues to attend to right field.  Mild dysarthria.  Lacks insight/awareness of deficits.  Mild left facial droop.  Able to follow basic motor commands without difficulty.   Motor: LUE: 4-/5 proxima to distal with apraxia RUE: 4/5 proximal to distal LLE: 4/5 proximal to distal RLE: 4+/5 proximal to distal DTRs symmetric, ?>Right side   Skin: Skin is warm and dry.  Psychiatric: His affect is inappropriate. He is slowed. He expresses impulsivity and inappropriate judgment.    Results for orders placed or performed during the hospital encounter of 03/23/16 (from the past 48 hour(s))  Glucose, capillary     Status: Abnormal   Collection Time: 03/29/16  8:16 PM  Result Value Ref Range   Glucose-Capillary 108 (H) 65 - 99 mg/dL  Glucose, capillary     Status: Abnormal   Collection Time: 03/29/16 11:33 PM  Result Value Ref Range   Glucose-Capillary 112 (H) 65 - 99 mg/dL  Basic metabolic panel     Status: Abnormal   Collection Time: 03/30/16  3:03 AM  Result  Value Ref Range   Sodium 140 135 - 145 mmol/L   Potassium 3.5 3.5 - 5.1 mmol/L   Chloride 105 101 - 111 mmol/L   CO2 21 (L) 22 - 32 mmol/L   Glucose, Bld 123 (H) 65 - 99 mg/dL   BUN 18 6 - 20 mg/dL   Creatinine, Ser 0.71 0.61 - 1.24 mg/dL   Calcium 10.1 8.9 - 10.3 mg/dL   GFR calc non Af Amer >60 >60 mL/min   GFR calc Af Amer >60 >60 mL/min    Comment: (NOTE) The eGFR has been calculated using the CKD EPI equation. This calculation has not been validated in all clinical situations. eGFR's persistently <60 mL/min signify possible Chronic Kidney Disease.    Anion gap 14 5 - 15  CBC     Status: Abnormal   Collection Time: 03/30/16  3:03 AM  Result Value Ref Range   WBC 11.0 (H) 4.0 - 10.5 K/uL   RBC 5.02 4.22 - 5.81 MIL/uL   Hemoglobin 13.9 13.0 - 17.0 g/dL   HCT 41.3 39.0 - 52.0 %   MCV 82.3 78.0 - 100.0 fL   MCH 27.7 26.0 - 34.0 pg   MCHC 33.7 30.0 - 36.0 g/dL   RDW 14.2 11.5 - 15.5 %   Platelets 302 150 - 400 K/uL  Magnesium     Status: None   Collection Time: 03/30/16  3:03 AM  Result Value Ref Range   Magnesium 2.2 1.7 - 2.4 mg/dL  Phosphorus     Status: None   Collection Time: 03/30/16  3:03 AM  Result Value Ref Range   Phosphorus 3.7 2.5 - 4.6 mg/dL  Glucose, capillary     Status: Abnormal   Collection Time: 03/30/16  3:24 AM  Result Value Ref Range   Glucose-Capillary 122 (H) 65 - 99 mg/dL  Glucose, capillary     Status: Abnormal   Collection Time: 03/30/16  8:34 AM  Result Value Ref Range   Glucose-Capillary 122 (H) 65 - 99 mg/dL  Glucose, capillary     Status: Abnormal   Collection Time: 03/30/16 12:29 PM  Result Value Ref Range   Glucose-Capillary 127 (H) 65 - 99 mg/dL  Glucose, capillary     Status: Abnormal   Collection Time: 03/30/16  8:07 PM  Result Value Ref Range   Glucose-Capillary 120 (H) 65 - 99 mg/dL   No results found.     Medical Problem List and Plan: 1.  Poor safety awareness, cognitive deficits, weakness secondary to anoxic BI. 2.   DVT Prophylaxis/Anticoagulation: Pharmaceutical: Lovenox 3. Pain Management: Tylenol prn 4. Mood:  LCSW to follow for evaluation and support.  5. Neuropsych: This patient is not fully capable of making decisions on his own behalf. 6. Skin/Wound Care: routine pressure relief measures.  7. Fluids/Electrolytes/Nutrition: Monitor I/O. Check lytes in am.  8. CAD: Off ASA.  Continue metoprolol. Will start low dose losartan for better BP control and add statin.  9. PEA: Felt to be due to respiratory event--cardiology has signed off.  10. HTN: Monitor BP bid. Titrate medications as indicated.  11. COPD with ongoing tobacco abuse: Respiratory status stable.  12. Dysphagia:  Continue dysphagia 2, thin liquids with full supervision for safety.  13. Leucocytosis: Monitor temp curve and monitor for signs of infection.   Post Admission Physician Evaluation: 1. Preadmission assessment reviewed and changes made below. 2. Functional deficits secondary  to anoxic BI. 3. Patient is admitted to receive collaborative, interdisciplinary care between the physiatrist, rehab nursing staff, and therapy team. 4. Patient's level of medical complexity and substantial therapy needs in context of that medical necessity cannot be provided at a lesser intensity of care such as a SNF. 5. Patient has experienced substantial functional loss from his/her baseline which was documented above under the "Functional History" and "Functional Status" headings.  Judging by the patient's diagnosis, physical exam, and functional history, the patient has potential for functional progress which will result in measurable gains while on inpatient rehab.  These gains will be of substantial and practical use upon discharge  in facilitating mobility and self-care at the household level. 6. Physiatrist will provide 24 hour management of medical needs as well as oversight of the therapy plan/treatment and provide guidance as appropriate regarding the  interaction of the two. 7. The Preadmission Screening has been reviewed and patient status is unchanged unless otherwise stated above. 8. 24 hour rehab nursing will assist with safety, disease management, medication administration and patient education  and help integrate therapy concepts, techniques,education, etc. 9. PT will assess and treat for/with: Lower extremity strength, range of motion, stamina, balance, functional mobility, safety, adaptive techniques and equipment, woundcare, coping skills, pain control, education.   Goals are: Min A/Supervision. 10. OT will assess and treat for/with: ADL's, functional mobility, safety, upper extremity strength, adaptive techniques  and equipment, wound mgt, ego support, and community reintegration.   Goals are: Min A/superversion. Therapy may proceed with showering this patient. 11. SLP will assess and treat for/with: speech, language, cognition.  Goals are: Min A. 12. Case Management and Social Worker will assess and treat for psychological issues and discharge planning. 61. Team conference will be held weekly to assess progress toward goals and to determine barriers to discharge. 14. Patient will receive at least 3 hours of therapy per day at least 5 days per week. 15. ELOS: 10-15 days.       16. Prognosis:  good and fair   Flora Lipps 03/31/2016  Delice Lesch, MD, [facility]

## 2016-04-01 ENCOUNTER — Inpatient Hospital Stay (HOSPITAL_COMMUNITY): Payer: Medicare HMO | Admitting: Physical Therapy

## 2016-04-01 ENCOUNTER — Inpatient Hospital Stay (HOSPITAL_COMMUNITY): Payer: Medicare HMO | Admitting: Speech Pathology

## 2016-04-01 ENCOUNTER — Inpatient Hospital Stay (HOSPITAL_COMMUNITY): Payer: Medicare HMO | Admitting: Occupational Therapy

## 2016-04-01 DIAGNOSIS — I69354 Hemiplegia and hemiparesis following cerebral infarction affecting left non-dominant side: Secondary | ICD-10-CM

## 2016-04-01 LAB — CBC WITH DIFFERENTIAL/PLATELET
Basophils Absolute: 0 K/uL (ref 0.0–0.1)
Basophils Relative: 1 %
Eosinophils Absolute: 0.1 K/uL (ref 0.0–0.7)
Eosinophils Relative: 1 %
HCT: 39.8 % (ref 39.0–52.0)
Hemoglobin: 13.2 g/dL (ref 13.0–17.0)
Lymphocytes Relative: 23 %
Lymphs Abs: 1.4 K/uL (ref 0.7–4.0)
MCH: 27.2 pg (ref 26.0–34.0)
MCHC: 33.2 g/dL (ref 30.0–36.0)
MCV: 82.1 fL (ref 78.0–100.0)
Monocytes Absolute: 1.1 K/uL — ABNORMAL HIGH (ref 0.1–1.0)
Monocytes Relative: 18 %
Neutro Abs: 3.5 K/uL (ref 1.7–7.7)
Neutrophils Relative %: 57 %
Platelets: 336 K/uL (ref 150–400)
RBC: 4.85 MIL/uL (ref 4.22–5.81)
RDW: 14.2 % (ref 11.5–15.5)
WBC: 6 K/uL (ref 4.0–10.5)

## 2016-04-01 LAB — COMPREHENSIVE METABOLIC PANEL WITH GFR
ALT: 21 U/L (ref 17–63)
AST: 22 U/L (ref 15–41)
Albumin: 3.5 g/dL (ref 3.5–5.0)
Alkaline Phosphatase: 46 U/L (ref 38–126)
Anion gap: 6 (ref 5–15)
BUN: 12 mg/dL (ref 6–20)
CO2: 28 mmol/L (ref 22–32)
Calcium: 9.8 mg/dL (ref 8.9–10.3)
Chloride: 104 mmol/L (ref 101–111)
Creatinine, Ser: 0.84 mg/dL (ref 0.61–1.24)
GFR calc Af Amer: >60 mL/min
GFR calc non Af Amer: >60 mL/min
Glucose, Bld: 99 mg/dL (ref 65–99)
Potassium: 3.6 mmol/L (ref 3.5–5.1)
Sodium: 138 mmol/L (ref 135–145)
Total Bilirubin: 0.9 mg/dL (ref 0.3–1.2)
Total Protein: 7.8 g/dL (ref 6.5–8.1)

## 2016-04-01 NOTE — Evaluation (Signed)
Speech Language Pathology Assessment and Plan  Patient Details  Name: Randall Patel MRN: [id number] Date of Birth: 1950/09/01  SLP Diagnosis: Other (comment) (Baseline deficits present)    Today's Date: 04/01/2016 SLP Individual Time: 1100-1200 SLP Individual Time Calculation (min): 60 min    Problem List: Patient Active Problem List   Diagnosis Date Noted  . Anoxic brain injury ([place]) 03/31/2016  . Pulseless electrical activity ([place])   . Chronic obstructive pulmonary disease ([place])   . Dysphagia, post-stroke   . Abnormal CT of brain   . Coronary artery disease involving native coronary artery of native heart without angina pectoris   . ETOH abuse   . Tachypnea   . Benign essential HTN   . Hyperglycemia   . Pain   . Leukocytosis   . Acute pulmonary edema (HCC)   . Respiratory failure ([place])   . Sinus tachycardia   . Cardiac arrest ([place]) 03/23/2016  . Palpitations 08/15/2015  . History of stroke 08/15/2015  . Anterior cerebral circulation hemorrhagic infarction ([place]) 08/15/2015  . HLD (hyperlipidemia) 08/15/2015  . Muscle stiffness   . Cough   . Hemiparesis affecting left side as late effect of stroke ([place])   . Dysphagia as late effect of cerebrovascular disease   . Essential hypertension   . Acute blood loss anemia   . 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 Dysphagia   . Dysarthria due to cerebrovascular accident ([place])   . Diastolic dysfunction   . Tobacco abuse   . ETOH abuse   . History of CVA with residual deficit   . Coronary artery disease involving native coronary artery of native heart with angina pectoris ([place])   . Tachypnea   . Hypernatremia   . Hypokalemia   . Thrombocytopenia ([place])   . Intracranial hemorrhage ([place])   . Essential hypertension, malignant   . ICH (intracerebral hemorrhage) ([place]) 04/04/2015  . STEMI (ST elevation myocardial infarction) ([place]) 04/14/2012  . NSTEMI (non-ST elevated myocardial infarction)  ([place]) 04/14/2012  . Cardiomyopathy, ischemic 04/14/2012  . Cerebral embolism with cerebral infarction ([place]) 04/03/2012  . Unspecified transient cerebral ischemia 04/02/2012  . Hemiplegia, unspecified, affecting nondominant side 04/02/2012  . CAD (coronary artery disease) s/p stent 2008 in [place] 04/02/2012  . Hypertension    Past Medical History:  Past Medical History:  Diagnosis Date  . CAD (coronary artery disease)    a. prior MI in 2006 b. 03/2012: 30% ostial LM stenosis, 30% pLAD stenosis, 60% mid-LADm 30% pCx, 3rd OM which was small in size with diffuse 90% stenosis involving both branches, and RCA with 50% mid-stenosis and 100% distal occlusion. POBA to PL branch and DES placement to dRCA.  . Claudication ([place])   . Coronary artery disease    MI in 2008 with PCI to ? vessel; NSTEMI 03/2012; NSTEMI 04/2012 Cath showed triple vessel dz including occluded RCA, s/p DES to distal RCA, PTCA posterolateral branch   . CVA (cerebral infarction)    03/2012 with left arm weakness/numbness and left facial droop.  [name] ETOH abuse    quit 04/06/12  . ETOH abuse   . History of stroke 03/2015   right frontal hemorrhage  . HTN (hypertension)   . Hyperlipidemia   . Hypertension   . Ischemic cardiomyopathy    04/2012 EF 35-40%  . Ischemic cardiomyopathy    a. EF 40-45% by echo in 03/2015  . Seizures ([place])   . Stroke ([place])   . Tobacco abuse  quit 04/06/12  . Tobacco abuse   . Withdrawal seizures [facility])    Past Surgical History:  Past Surgical History:  Procedure Laterality Date  . CORONARY ANGIOPLASTY WITH STENT PLACEMENT  03/2012  . HERNIA REPAIR    . LEFT HEART CATHETERIZATION WITH CORONARY ANGIOGRAM N/A 04/11/2012   Procedure: LEFT HEART CATHETERIZATION WITH CORONARY ANGIOGRAM;  Surgeon: Burnell Blanks, MD;  Location: [facility] CATH LAB;  Service: Cardiovascular;  Laterality: N/A;  . PERCUTANEOUS CORONARY INTERVENTION-BALLOON ONLY  04/11/2012   Procedure: PERCUTANEOUS CORONARY  INTERVENTION-BALLOON ONLY;  Surgeon: Burnell Blanks, MD;  Location: [facility] CATH LAB;  Service: Cardiovascular;;  RPLA  . PERCUTANEOUS CORONARY STENT INTERVENTION (PCI-S)  04/11/2012   Procedure: PERCUTANEOUS CORONARY STENT INTERVENTION (PCI-S);  Surgeon: Burnell Blanks, MD;  Location: [facility] CATH LAB;  Service: Cardiovascular;;  Distal RCA    Assessment / Plan / Recommendation Clinical Impression Randall Patel is a 65 y.o. 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 EMS found patient unresponsive on the ground with blood sputum but with pulse. He went cardiac arrest--PEA treated with CPR x 6 minutes and epinephrine with ROSC. He was acidotic at admission and intubated and started on IV antibiotics. UDS positive for benzos and THC. MRI brain revealed subacute hematoma right frontal lobe--31 mm. Cardiology consulted and elevated cardiac enzymes felt to be due to demand ischemia in setting of cardiac arrest. 2 D echo with EF 45-50% with hypokinesis of inferior and inferoseptal myocardium and moderately increased pulmonary pressures. Cath precluded due to hematoma and cardiology recommended medical management. EEG showed suggestion of global anoxic injury and Dr. Leonie Man felt that patient with asymptomatic ICH with incidental old finding from prior right posterior frontal hematoma with old right anterior frontal infarct from 03/2015. Encephalopathy felt to be due to ABI and agitation has resolved. Acute pulmonary edema treated with diuresis and . elevated HTN was managed with titration to oral meds. 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 CIR was recommended for follow up therapy. Of note, pt with previous admission to CIR on 03/2015 with discharge on dysphagia 2 diet, thin liquids  and medicine whole. At discharge from [place] he scored 17/30 points on the MoCA with a score of 26 or above considered normal. He demonstrated impairments in the areas of attention to left field of environment, selective attention, immediate and short-term recall. Pt required overall supervision verbal cues for use of speech intelligibility strategies to achieve 90-100% intelligibility at the sentence level. Pt made cognitive gains and required overall Min-Mod A verbal cues to complete functional and familiar tasks in regards to selective attention, attention to left field of environment, recall, problem solving, awareness and safety with functional and familiar tasks. Pt was discharged home with 24 hour supervision as he lived with his sister.  Bedside Swallow Evaluation and Cognitive-Linguisitc Evaluations were provided on 04/01/16 with chronic deficits noted in the areas of attention to left field of environment, selective attention and immediate and s hort-term recall. At this time, pt's deficits are considered to be his baseline level of functioning further complicated by ETOH and olysubstance abouse. These deficits don't appear worsed and are not indicative of any need for further skilled ST services. Pt plans to discharge home with his sister (as before) with 24 hour supervision.   Skilled Therapeutic Interventions  Cognitive-Linguistic and Bedside Swallow Evaluations conducted. Results shared with pt, nursing and interdisciplinary team. Pt appears to be at baseline level of functioning and required Mod A verbal cues for attention to task and Mod A verbal cues for recall of information. Pt consuming dysphagia 2 diet with thin liquids without overt s/s of aspiration. Pt left in wheelchair with safety belt donned and all needs within reach.    SLP Assessment  Patient does not need any further Speech Lanaguage Pathology Services    Recommendations  SLP Diet Recommendations: Dysphagia 2 (Fine  chop);Thin Liquid Administration via: Straw Medication Administration: Whole meds with liquid Supervision: Staff to assist with self feeding;Full supervision/cueing for compensatory strategies Compensations: Minimize environmental distractions;Slow rate;Small sips/bites;Lingual sweep for clearance of pocketing;Monitor for anterior loss;Follow solids with liquid Postural Changes and/or Swallow Maneuvers: Seated upright 90 degrees;Upright 30-60 min after meal Oral Care Recommendations: Oral care BID Recommendations for Other Services: Neuropsych consult Patient destination: Home Follow up Recommendations: None Equipment Recommended: None recommended by SLP    Pain    Prior Functioning Cognitive/Linguistic Baseline: Baseline deficits Type of Home: House  Lives With: Family Available Help at Discharge: Family;Available 24 hours/day Vocation: On disability  Function:  Eating Eating   Modified Consistency Diet: Yes Eating Assist Level: Helper scoops food on utensil;Help with picking up utensils;Helper brings food to mouth     Helper [place] on Utensil: Every [place] to Mouth: Every scoop   Cognition Comprehension Comprehension assist level: Understands basic 50 - 74% of the time/ requires cueing 25 - 49% of the time  Expression   Expression assist level: Expresses basic 50 - 74% of the time/requires cueing 25 - 49% of the time. Needs to repeat parts of sentences.  Social Interaction Social Interaction assist level: Interacts appropriately 50 - 74% of the time - May be physically or verbally inappropriate.  Problem Solving Problem solving assist level: Solves basic 50 - 74% of the time/requires cueing 25 - 49% of the time  Memory Memory assist level: Recognizes or recalls 50 - 74% of the time/requires cueing 25 - 49% of the time   Short Term Goals: No short term goals set  Refer to Care Plan for Long Term Goals  Recommendations for other services:  Neuropsych  Discharge Criteria: Patient will be discharged from SLP if patient refuses treatment 3 consecutive times without medical reason, if treatment goals not met, if there is a change in medical status, if patient makes no progress towards goals or if patient is discharged from hospital.  The above assessment, treatment plan, treatment alternatives and goals were discussed and mutually agreed upon: by patient  Merrissa Giacobbe 04/01/2016, 2:12 PM

## 2016-04-01 NOTE — Progress Notes (Signed)
Patient information reviewed and entered into eRehab system by Maxfield Gildersleeve, RN, CRRN, PPS Coordinator.  Information including medical coding and functional independence measure will be reviewed and updated through discharge.     Per nursing patient was given "Data Collection Information Summary for Patients in Inpatient Rehabilitation Facilities with attached "Privacy Act Statement-Health Care Records" upon admission.  

## 2016-04-01 NOTE — Progress Notes (Signed)
Subjective/Complaints: Pt states he had some problems with Left UE since CVA last year, chart reviewed, CIR admit last year with R F-P ICH,  Prior CVA in 2013 with resideual L HP noted as well ROS- denies CP, SOB, N/V/D Objective: Vital Signs: Blood pressure (!) 158/78, pulse 72, temperature 99 F (37.2 C), temperature source Oral, resp. rate 18, height '5\' 5"'  (1.651 m), weight 61.5 kg (135 lb 8 oz), SpO2 99 %. No results found. Results for orders placed or performed during the hospital encounter of 03/31/16 (from the past 72 hour(s))  Comprehensive metabolic panel     Status: None   Collection Time: 04/01/16  6:18 AM  Result Value Ref Range   Sodium 138 135 - 145 mmol/L   Potassium 3.6 3.5 - 5.1 mmol/L   Chloride 104 101 - 111 mmol/L   CO2 28 22 - 32 mmol/L   Glucose, Bld 99 65 - 99 mg/dL   BUN 12 6 - 20 mg/dL   Creatinine, Ser 0.84 0.61 - 1.24 mg/dL   Calcium 9.8 8.9 - 10.3 mg/dL   Total Protein 7.8 6.5 - 8.1 g/dL   Albumin 3.5 3.5 - 5.0 g/dL   AST 22 15 - 41 U/L   ALT 21 17 - 63 U/L   Alkaline Phosphatase 46 38 - 126 U/L   Total Bilirubin 0.9 0.3 - 1.2 mg/dL   GFR calc non Af Amer >60 >60 mL/min   GFR calc Af Amer >60 >60 mL/min    Comment: (NOTE) The eGFR has been calculated using the CKD EPI equation. This calculation has not been validated in all clinical situations. eGFR's persistently <60 mL/min signify possible Chronic Kidney Disease.    Anion gap 6 5 - 15  CBC WITH DIFFERENTIAL     Status: Abnormal   Collection Time: 04/01/16  6:18 AM  Result Value Ref Range   WBC 6.0 4.0 - 10.5 K/uL   RBC 4.85 4.22 - 5.81 MIL/uL   Hemoglobin 13.2 13.0 - 17.0 g/dL   HCT 39.8 39.0 - 52.0 %   MCV 82.1 78.0 - 100.0 fL   MCH 27.2 26.0 - 34.0 pg   MCHC 33.2 30.0 - 36.0 g/dL   RDW 14.2 11.5 - 15.5 %   Platelets 336 150 - 400 K/uL   Neutrophils Relative % 57 %   Neutro Abs 3.5 1.7 - 7.7 K/uL   Lymphocytes Relative 23 %   Lymphs Abs 1.4 0.7 - 4.0 K/uL   Monocytes Relative 18 %    Monocytes Absolute 1.1 (H) 0.1 - 1.0 K/uL   Eosinophils Relative 1 %   Eosinophils Absolute 0.1 0.0 - 0.7 K/uL   Basophils Relative 1 %   Basophils Absolute 0.0 0.0 - 0.1 K/uL      General: No acute distress Mood and affect are appropriate Heart: Regular rate and rhythm no rubs murmurs or extra sounds Lungs: Clear to auscultation, breathing unlabored, no rales or wheezes Abdomen: Positive bowel sounds, soft nontender to palpation, nondistended Extremities: No clubbing, cyanosis, or edema Skin: No evidence of breakdown, no evidence of rash Neurologic: Cranial nerves II through XII intact, motor strength is 4/5 in R deltoid, bicep, tricep, grip, Bilateral hip flexor, knee extensors, ankle dorsiflexor and plantar flexor 3- in left delt biceps triceps grip Sensory exam normal sensation to light touch and proprioception in bilateral upper and lower extremities Alert and oriented x 3 Cerebellar exam dysmetria bilateral finger to nose to fingerMusculoskeletal: Full range of motion in all  4 extremities. No joint swelling   Assessment/Plan: 1. 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 Physiatrist is providing close team supervision and 24 hour management of active medical problems listed below. Physiatrist and rehab team continue to assess barriers to discharge/monitor patient progress toward functional and medical goals. FIM:       Function - Toileting Toileting activity did not occur: No continent bowel/bladder event  Function - Air cabin crew transfer activity did not occur: Safety/medical concerns        Function - Comprehension Comprehension: Auditory Comprehension assist level: Understands basic less than 25% of the time/ requires cueing >75% of the time  Function - Expression Expression: Verbal Expression assist level:  Expresses basic 25 - 49% of the time/requires cueing 50 - 75% of the time. Uses single words/gestures.  Function - Social Interaction Social Interaction assist level: Interacts appropriately 50 - 74% of the time - May be physically or verbally inappropriate.  Function - Problem Solving Problem solving assist level: Solves basic less than 25% of the time - needs direction nearly all the time or does not effectively solve problems and may need a restraint for safety  Function - Memory Memory assist level: Recognizes or recalls 25 - 49% of the time/requires cueing 50 - 75% of the time Patient normally able to recall (first 3 days only): That he or she is in a hospital, Current season  Medical Problem List and Plan: 1.  Poor safety awareness, cognitive deficits, weakness secondary to anoxic BI. Has presumable new RUE ataxia superimposed on Left UE weakness CIR PT, OT, SLP 2.  DVT Prophylaxis/Anticoagulation: Pharmaceutical: Lovenox 3. Pain Management: Tylenol prn 4. Mood:  LCSW to follow for evaluation and support.  5. Neuropsych: This patient is not fully capable of making decisions on his own behalf. 6. Skin/Wound Care: routine pressure relief measures.  7. Fluids/Electrolytes/Nutrition: Monitor I/O. Check lytes in am.  8. CAD: Off ASA.  Continue metoprolol. Will start low dose losartan for better BP control and add statin.  9. PEA: Felt to be due to respiratory event, overdose may have been initiating event with aspiration PNA---cardiology has signed off.  10. HTN: Monitor BP bid. Titrate medications as indicated.  11. COPD with ongoing tobacco abuse: Respiratory status stable.  12. Dysphagia:  Continue dysphagia 2, thin liquids with full supervision for safety.  13. Leucocytosis: resolved WBC 6.0 14.  Hx Polysub abuse- ask neuropsych to eval LOS (Days) 1 A FACE TO FACE EVALUATION WAS PERFORMED  Ziggy Reveles E 04/01/2016, 8:13 AM

## 2016-04-01 NOTE — Progress Notes (Signed)
Social Work Assessment and Plan Social Work Assessment and Plan  Patient Details  Name: Randall Patel MRN: [id number] Date of Birth: 1950-10-21  Today's Date: 04/01/2016  Problem List:  Patient Active Problem List   Diagnosis Date Noted  . Anoxic brain injury (HCC) 03/31/2016  . Pulseless electrical activity (HCC)   . Chronic obstructive pulmonary disease (HCC)   . Dysphagia, post-stroke   . Abnormal CT of brain   . Coronary artery disease involving native coronary artery of native heart without angina pectoris   . ETOH abuse   . Tachypnea   . Benign essential HTN   . Hyperglycemia   . Pain   . Leukocytosis   . Acute pulmonary edema (HCC)   . Respiratory failure (HCC)   . Sinus tachycardia   . Cardiac arrest (HCC) 03/23/2016  . Palpitations 08/15/2015  . History of stroke 08/15/2015  . Anterior cerebral circulation hemorrhagic infarction (HCC) 08/15/2015  . HLD (hyperlipidemia) 08/15/2015  . Muscle stiffness   . Cough   . Hemiparesis affecting left side as late effect of stroke (HCC)   . Dysphagia as late effect of cerebrovascular disease   . Essential hypertension   . Acute blood loss anemia   . 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 Dysphagia   . 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 Diastolic dysfunction   . Tobacco abuse   . ETOH abuse   . History of CVA with residual deficit   . Coronary artery disease involving native coronary artery of native heart with angina pectoris (HCC)   . Tachypnea   . Hypernatremia   . Hypokalemia   . Thrombocytopenia (HCC)   . Intracranial hemorrhage (HCC)   . Essential hypertension, malignant   . ICH (intracerebral hemorrhage) (HCC) 04/04/2015  . STEMI (ST elevation myocardial infarction) (HCC) 04/14/2012  . NSTEMI (non-ST elevated myocardial infarction) (HCC) 04/14/2012  . Cardiomyopathy, ischemic 04/14/2012  . Cerebral embolism with cerebral infarction (HCC) 04/03/2012  .  Unspecified transient cerebral ischemia 04/02/2012  . Hemiplegia, unspecified, affecting nondominant side 04/02/2012  . CAD (coronary artery disease) s/p stent 2008 in [place] 04/02/2012  . Hypertension    Past Medical History:  Past Medical History:  Diagnosis Date  . CAD (coronary artery disease)    a. prior MI in 2006 b. 03/2012: 30% ostial LM stenosis, 30% pLAD stenosis, 60% mid-LADm 30% pCx, 3rd OM which was small in size with diffuse 90% stenosis involving both branches, and RCA with 50% mid-stenosis and 100% distal occlusion. POBA to PL branch and DES placement to dRCA.  . Claudication (HCC)   . Coronary artery disease    MI in 2008 with PCI to ? vessel; NSTEMI 03/2012; NSTEMI 04/2012 Cath showed triple vessel dz including occluded RCA, s/p DES to distal RCA, PTCA posterolateral branch   . CVA (cerebral infarction)    03/2012 with left arm weakness/numbness and left facial droop.  [name] ETOH abuse    quit 04/06/12  . ETOH abuse   . History of stroke 03/2015   right frontal hemorrhage  . HTN (hypertension)   . Hyperlipidemia   . Hypertension   . Ischemic cardiomyopathy    04/2012 EF 35-40%  . Ischemic cardiomyopathy    a. EF 40-45% by echo in 03/2015  . Seizures (HCC)   . Stroke (HCC)   . Tobacco abuse    quit 04/06/12  . Tobacco abuse   . Withdrawal seizures (HCC)  Past Surgical History:  Past Surgical History:  Procedure Laterality Date  . CORONARY ANGIOPLASTY WITH STENT PLACEMENT  03/2012  . HERNIA REPAIR    . LEFT HEART CATHETERIZATION WITH CORONARY ANGIOGRAM N/A 04/11/2012   Procedure: LEFT HEART CATHETERIZATION WITH CORONARY ANGIOGRAM;  Surgeon: Kathleene Hazel, MD;  Location: [facility] CATH LAB;  Service: Cardiovascular;  Laterality: N/A;  . PERCUTANEOUS CORONARY INTERVENTION-BALLOON ONLY  04/11/2012   Procedure: PERCUTANEOUS CORONARY INTERVENTION-BALLOON ONLY;  Surgeon: Kathleene Hazel, MD;  Location: [facility] CATH LAB;  Service: Cardiovascular;;  RPLA  .  PERCUTANEOUS CORONARY STENT INTERVENTION (PCI-S)  04/11/2012   Procedure: PERCUTANEOUS CORONARY STENT INTERVENTION (PCI-S);  Surgeon: Kathleene Hazel, MD;  Location: [facility] CATH LAB;  Service: Cardiovascular;;  Distal RCA   Social History:  reports that he has been smoking Cigarettes.  He has a 40.00 pack-year smoking history. He has never used smokeless tobacco. He reports that he drinks alcohol. He reports that he uses drugs, including Benzodiazepines and Marijuana.  Family / Support Systems Marital Status: Single Patient Roles: Parent, Other (Comment) (Sibling) Children: Algot Dennington [phone] Other Supports: Adella Hare [phone]  [phone]    Anticipated Caregiver: Sister Ability/Limitations of Caregiver: No limitations provided supervision prior to admission Caregiver Availability: 24/7 Family Dynamics: Close with siblings and son, he sort of does what he wants-ie drinking and other extra cirrcular activities. He stays close with his siblings and lives with sister. He has friends that are not always the best influence on him, he reports this.  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 No formal POA completed.   Abuse/Neglect Physical Abuse: Denies Verbal Abuse: Denies Sexual Abuse: Denies Exploitation of patient/patient's resources: Denies Self-Neglect: Denies  Emotional Status Pt's affect, behavior adn adjustment status: Pt is motivated and likes to talk, he has always been like this. He has had many health issues-hx-CVA's and has alwasy managed to recover and remain independent.  He wants to do this again when he leaves here. He realizes he could have not survived but will make the most of this chance. Recent Psychosocial Issues: other health issues-L-UE weakness from past CVA 2016.  Pyschiatric History: No history according to him or sister Substance Abuse History: ETOH and THC he reports quitting several times but always goes back to it. He is aware of the resources to help him quit, he likes it and this makes it hard to stop. Will continue to discuss this while here.  Patient / Family Perceptions, Expectations & Goals Pt/Family understanding of illness & functional limitations: Pt and sister can explain the reason he is here. Both are grateful he survived and is doing well. Sister talks with the MD when she is here and feels she has a good understanding of his treatment plan. Pt can say he has a heart attack and then a stroke. Premorbid pt/family roles/activities: Father, brother, friend, retiree, etc Anticipated changes in roles/activities/participation: resume Pt/family expectations/goals: Pt states: " I want to do for myself, my sister can't help me too much."  Sister states: "I hope he does well like he did last year when here."  [employer]: Other (Comment) (had in past) Premorbid Home Care/DME Agencies: Other (Comment) (had in past)  Transportation available at discharge: Sister and brother Resource referrals recommended: Support group (specify), Neuropsychology  Discharge Planning Living Arrangements: Other relatives Support Systems: Children, Other relatives, Friends/neighbors Type of Residence: Private residence Insurance Resources: [employer] (specify [place]), [employer] Insurance (specify) ([employer] Medicare) Financial Resources: SSI, Family Support Financial Screen Referred: Previously completed Living Expenses: Rent Money Management: Patient, Family Does the patient have any problems obtaining your medications?: No Home  Management: Sister does the home management Patient/Family Preliminary Plans: Return home with sister-[name] May who has provided care to him last year after his stroke then. She can not provide much physical care, but can supervision level. Aware of the rehab process due to was here last year at this time. Will await team's evaluations. Pt is upset today over Uncle's death. Social Work Anticipated Follow Up Needs: HH/OP, Support Group  Clinical Impression Pleasant talkative gentleman who is upset uncle died in a nursing home, feels nursing didn't take good care of him. Perserevating on this, his sister is at his funeral today. Sister is supportive and aware she will need to assist pt once again At discharge. Will work on discharge needs, pt appears to be doing well considering all that has happened to him when he came into the hospital. He probably will not stop drinking since he has been doing so long and enjoys it. Will work on discharge needs and have neuro-psych see while here.  Lucy Chrisupree, Yaelis Scharfenberg G 04/01/2016, 2:05 PM

## 2016-04-01 NOTE — Care Management Note (Signed)
[facility] Individual Statement of Services  Patient Name:  Randall Patel  Date:  04/01/2016  Welcome to the [facility].  Our goal is to provide you with an individualized program based on your diagnosis and situation, designed to meet your specific needs.  With this comprehensive rehabilitation program, you will be expected to participate in at least 3 hours of rehabilitation therapies Monday-Friday, with modified therapy programming on the weekends.  Your rehabilitation program will include the following services:  Physical Therapy (PT), Occupational Therapy (OT), Speech Therapy (ST), 24 hour per day rehabilitation nursing, Therapeutic Recreaction (TR), Neuropsychology, Case Management (Social Worker), Rehabilitation Medicine, Nutrition Services and Pharmacy Services  Weekly team conferences will be held on Wednesday to discuss your progress.  Your Social Worker will talk with you frequently to get your input and to update you on team discussions.  Team conferences with you and your family in attendance may also be held.  Expected length of stay: 14-17 days Overall anticipated outcome: supervision with cueing/set-up  Depending on your progress and recovery, your program may change. Your Social Worker will coordinate services and will keep you informed of any changes. Your Social Worker's name and contact numbers are listed  below.  The following services may also be recommended but are not provided by the Inpatient [facility]:  Home Health Rehabiltiation Services  Outpatient Rehabilitation Services    Arrangements will be made to provide these services after discharge if needed.  Arrangements include referral to agencies that provide these services.  Your insurance has been verified to be:  [employer] & medicaid Your primary doctor is:    Pertinent information will be shared with your doctor and your insurance company.  Social  Worker:  Dossie Der, SW [phone] or ([phone]  Information discussed with and copy given to patient by: Lucy Chris, 04/01/2016, 11:34 AM

## 2016-04-01 NOTE — Evaluation (Signed)
Physical Therapy Assessment and Plan  Patient Details  Name: Randall Patel MRN: [id number] Date of Birth: 04/03/1951  PT Diagnosis: Abnormality of gait, Cognitive deficits, Difficulty walking, Impaired cognition, Impaired sensation and Muscle weakness Rehab Potential: Good ELOS: 14-17 minutes   Today's Date: 04/01/2016 PT Individual Time: 0845-1000 PT Individual Time Calculation (min): 75 min     Problem List: Patient Active Problem List   Diagnosis Date Noted  . Anoxic brain injury ([place]) 03/31/2016  . Pulseless electrical activity ([place])   . Chronic obstructive pulmonary disease ([place])   . Dysphagia, post-stroke   . Abnormal CT of brain   . Coronary artery disease involving native coronary artery of native heart without angina pectoris   . ETOH abuse   . Tachypnea   . Benign essential HTN   . Hyperglycemia   . Pain   . Leukocytosis   . Acute pulmonary edema (HCC)   . Respiratory failure ([place])   . Sinus tachycardia   . Cardiac arrest ([place]) 03/23/2016  . Palpitations 08/15/2015  . History of stroke 08/15/2015  . Anterior cerebral circulation hemorrhagic infarction ([place]) 08/15/2015  . HLD (hyperlipidemia) 08/15/2015  . Muscle stiffness   . Cough   . Hemiparesis affecting left side as late effect of stroke ([place])   . Dysphagia as late effect of cerebrovascular disease   . Essential hypertension   . Acute blood loss anemia   . 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 ([place]) 04/10/2015  . Dysphagia   . Dysarthria due to cerebrovascular accident ([place])   . Diastolic dysfunction   . Tobacco abuse   . ETOH abuse   . History of CVA with residual deficit   . Coronary artery disease involving native coronary artery of native heart with angina pectoris ([place])   . Tachypnea   . Hypernatremia   . Hypokalemia   . Thrombocytopenia ([place])   . Intracranial hemorrhage ([place])   . Essential hypertension, malignant   . ICH (intracerebral hemorrhage) ([place]) 04/04/2015  . STEMI  (ST elevation myocardial infarction) ([place]) 04/14/2012  . NSTEMI (non-ST elevated myocardial infarction) ([place]) 04/14/2012  . Cardiomyopathy, ischemic 04/14/2012  . Cerebral embolism with cerebral infarction ([place]) 04/03/2012  . Unspecified transient cerebral ischemia 04/02/2012  . Hemiplegia, unspecified, affecting nondominant side 04/02/2012  . CAD (coronary artery disease) s/p stent 2008 in [place] 04/02/2012  . Hypertension     Past Medical History:  Past Medical History:  Diagnosis Date  . CAD (coronary artery disease)    a. prior MI in 2006 b. 03/2012: 30% ostial LM stenosis, 30% pLAD stenosis, 60% mid-LADm 30% pCx, 3rd OM which was small in size with diffuse 90% stenosis involving both branches, and RCA with 50% mid-stenosis and 100% distal occlusion. POBA to PL branch and DES placement to dRCA.  . Claudication ([place])   . Coronary artery disease    MI in 2008 with PCI to ? vessel; NSTEMI 03/2012; NSTEMI 04/2012 Cath showed triple vessel dz including occluded RCA, s/p DES to distal RCA, PTCA posterolateral branch   . CVA (cerebral infarction)    03/2012 with left arm weakness/numbness and left facial droop.  [name] ETOH abuse    quit 04/06/12  . ETOH abuse   . History of stroke 03/2015   right frontal hemorrhage  . HTN (hypertension)   . Hyperlipidemia   . Hypertension   . Ischemic cardiomyopathy    04/2012 EF 35-40%  . Ischemic cardiomyopathy    a. EF 40-45% by echo in  03/2015  . Seizures ([place])   . Stroke ([place])   . Tobacco abuse    quit 04/06/12  . Tobacco abuse   . Withdrawal seizures [facility])    Past Surgical History:  Past Surgical History:  Procedure Laterality Date  . CORONARY ANGIOPLASTY WITH STENT PLACEMENT  03/2012  . HERNIA REPAIR    . LEFT HEART CATHETERIZATION WITH CORONARY ANGIOGRAM N/A 04/11/2012   Procedure: LEFT HEART CATHETERIZATION WITH CORONARY ANGIOGRAM;  Surgeon: Burnell Blanks, MD;  Location: [facility] CATH LAB;  Service: Cardiovascular;  Laterality: N/A;  .  PERCUTANEOUS CORONARY INTERVENTION-BALLOON ONLY  04/11/2012   Procedure: PERCUTANEOUS CORONARY INTERVENTION-BALLOON ONLY;  Surgeon: Burnell Blanks, MD;  Location: [facility] CATH LAB;  Service: Cardiovascular;;  RPLA  . PERCUTANEOUS CORONARY STENT INTERVENTION (PCI-S)  04/11/2012   Procedure: PERCUTANEOUS CORONARY STENT INTERVENTION (PCI-S);  Surgeon: Burnell Blanks, MD;  Location: [facility] CATH LAB;  Service: Cardiovascular;;  Distal RCA    Assessment & Plan Clinical Impression: Patient is a 65 y.o. year old male  with history of CAD with ICM, ETOH abuse, 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 EMS found patient unresponsive on the ground with blood sputum but with pulse. He went cardiac arrest--PEA treated with CPR x 6 minutes and epinephrine with ROSC. He was acidotic at admission and intubated and started on IV antibiotics. UDS positive for benzos and THC. MRI brain revealed subacute hematoma right frontal lobe--31 mm. Cardiology consulted and elevated cardiac enzymes felt to be due to demand ischemia in setting of cardiac arrest. 2 D echo with EF 45-50% with hypokinesis of inferior and inferoseptal myocardium and moderately increased pulmonary pressures. Cath precluded due to hematoma and cardiology recommended medical management.  Patient transferred to CIR on 03/31/2016 .   Patient currently requires mod with mobility secondary to muscle weakness, ataxia, decreased coordination and decreased motor planning, decreased attention, decreased awareness, decreased problem solving and decreased safety awareness and decreased sitting balance, decreased standing balance, decreased postural control and decreased balance strategies.  Prior to hospitalization, patient was independent  with mobility and lived with Family in a House home.  Home access is 4Stairs to enter.  Patient will benefit from skilled PT intervention to maximize safe  functional mobility, minimize fall risk and decrease caregiver burden for planned discharge home with 24 hour supervision.  Anticipate patient will benefit from follow up [place] at discharge.  PT - End of Session Activity Tolerance: Tolerates 30+ min activity with multiple rests Endurance Deficit: Yes PT Assessment Rehab Potential (ACUTE/IP ONLY): Good PT Patient demonstrates impairments in the following area(s): Balance;Endurance;Motor;Safety;Sensory PT Transfers Functional Problem(s): Bed Mobility;Bed to Chair;Car;Furniture;Floor PT Locomotion Functional Problem(s): Stairs;Wheelchair Mobility;Ambulation PT Plan PT Intensity: Minimum of 1-2 x/day ,45 to 90 minutes PT Frequency: 5 out of 7 days PT Duration Estimated Length of Stay: 14-17 minutes PT Treatment/Interventions: Ambulation/gait training;Discharge planning;Cognitive remediation/compensation;Balance/vestibular training;Community reintegration;Functional electrical stimulation;Neuromuscular re-education;Patient/family education;Stair training;Therapeutic Exercise;UE/LE Coordination activities;Wheelchair propulsion/positioning;UE/LE Strength taining/ROM;Therapeutic Activities;Splinting/orthotics;Pain management;Functional mobility training;DME/adaptive equipment instruction PT Transfers Anticipated Outcome(s): supervision PT Locomotion Anticipated Outcome(s): supervision PT Recommendation Follow Up Recommendations: Home health PT Patient destination: Home Equipment Recommended: To be determined  Skilled Therapeutic Intervention Pt performed gait multiple attempts up to 55' with mod A, fading to min A with repetition. Pt with posterior lean and wide BOS, delayed balance reactions during LOB during gait.  Stair negotiation with 2 handrails with mod A, cues for attention to foot and UE placement, cues for safety.  Car transfer with mod A, pt requires max cuing and mod A to attain and maintain seated balance in car.  Ramp negotiation with mod  A, improving eccentric control with repetition.  Bed mobility training with mod A due to decreased sitting balance and ability to attain upright posture in sitting. Toilet transfer with mod A with grab bar. Standing static balance for hygiene and clothing management with min A for balance, total A for hygiene and donning pants and brief. Pt with impaired awareness of deficits and decreased attention noted throughout session.  PT Evaluation Precautions/Restrictions Precautions Precautions: Fall Restrictions Weight Bearing Restrictions: No Pain Pain Assessment Pain Assessment: No/denies pain Home Living/Prior Functioning Home Living Available Help at Discharge: Family;Available 24 hours/day Type of Home: House Home Access: Stairs to enter [employer] of Steps: 4 Entrance Stairs-Rails: Right;Left;Can reach both Home Layout: One level  Lives With: Family Prior Function Level of Independence: Independent with basic ADLs;Independent with gait;Independent with transfers  Able to Take Stairs?: Yes Driving: Yes Vocation: On disability  Cognition Overall Cognitive Status: Impaired/Different from baseline Orientation Level: Oriented to person;Oriented to situation Attention: Sustained Sustained Attention: Impaired Sustained Attention Impairment: Verbal basic;Functional basic Awareness: Impaired Awareness Impairment: Emergent impairment Safety/Judgment: Impaired Sensation Sensation Light Touch: Impaired by gross assessment Proprioception: Impaired Detail Proprioception Impaired Details: Impaired RUE;Impaired LUE;Impaired RLE;Impaired LLE Coordination Gross Motor Movements are Fluid and Coordinated: No Fine Motor Movements are Fluid and Coordinated: No Finger Nose Finger Test: ataxia Heel Shin Test: ataxia Motor  Motor Motor: Ataxia Motor - Skilled Clinical Observations: discoordinated UEs and LEs   Trunk/Postural Assessment  Cervical Assessment Cervical Assessment:  Within Functional Limits Thoracic Assessment Thoracic Assessment: Within Functional Limits Lumbar Assessment Lumbar Assessment:  (posterior tilt) Postural Control Postural Control: Deficits on evaluation Righting Reactions: delayed Protective Responses: delayed  Balance Balance Balance Assessed: Yes Dynamic Sitting Balance Sitting balance - Comments: mod A for sitting in the car Dynamic Standing Balance Dynamic Standing - Comments: mod A Extremity Assessment      RLE Assessment RLE Assessment:  (3/5) LLE Assessment LLE Assessment:  (3/5)   See Function Navigator for Current Functional Status.   Refer to Care Plan for Long Term Goals  Recommendations for other services: None   Discharge Criteria: Patient will be discharged from PT if patient refuses treatment 3 consecutive times without medical reason, if treatment goals not met, if there is a change in medical status, if patient makes no progress towards goals or if patient is discharged from hospital.  The above assessment, treatment plan, treatment alternatives and goals were discussed and mutually agreed upon: by patient  DONAWERTH,KAREN 04/01/2016, 10:00 AM

## 2016-04-01 NOTE — Evaluation (Signed)
Occupational Therapy Assessment and Plan  Patient Details  Name: Randall Patel MRN: [id number] Date of Birth: [birth year]  OT Diagnosis: abnormal posture, cognitive deficits, disturbance of vision, hemiplegia affecting dominant side, hemiplegia affecting non-dominant side and muscle weakness (generalized) Rehab Potential: Rehab Potential (ACUTE ONLY): Good ELOS: 14-16 days   Today's Date: 04/01/2016 OT Individual Time: 7681-1572 OT Individual Time Calculation (min): 57 min      Problem List: Patient Active Problem List   Diagnosis Date Noted  . Anoxic brain injury ([place]) 03/31/2016  . Pulseless electrical activity ([place])   . Chronic obstructive pulmonary disease ([place])   . Dysphagia, post-stroke   . Abnormal CT of brain   . Coronary artery disease involving native coronary artery of native heart without angina pectoris   . ETOH abuse   . Tachypnea   . Benign essential HTN   . Hyperglycemia   . Pain   . Leukocytosis   . Acute pulmonary edema (HCC)   . Respiratory failure ([place])   . Sinus tachycardia   . Cardiac arrest ([place]) 03/23/2016  . Palpitations 08/15/2015  . History of stroke 08/15/2015  . Anterior cerebral circulation hemorrhagic infarction ([place]) 08/15/2015  . HLD (hyperlipidemia) 08/15/2015  . Muscle stiffness   . Cough   . Hemiparesis affecting left side as late effect of stroke ([place])   . Dysphagia as late effect of cerebrovascular disease   . Essential hypertension   . Acute blood loss anemia   . 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 Dysphagia   . 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 Diastolic dysfunction   . Tobacco abuse   . ETOH abuse   . History of CVA with residual deficit   . Coronary artery disease involving native coronary artery of native heart with angina pectoris ([place])   . Tachypnea   . Hypernatremia   . Hypokalemia   . Thrombocytopenia ([place])   . Intracranial hemorrhage ([place])   . Essential  hypertension, malignant   . ICH (intracerebral hemorrhage) ([place]) 04/04/2015  . STEMI (ST elevation myocardial infarction) ([place]) 04/14/2012  . NSTEMI (non-ST elevated myocardial infarction) ([place]) 04/14/2012  . Cardiomyopathy, ischemic 04/14/2012  . Cerebral embolism with cerebral infarction ([place]) 04/03/2012  . Unspecified transient cerebral ischemia 04/02/2012  . Hemiplegia, unspecified, affecting nondominant side 04/02/2012  . CAD (coronary artery disease) s/p stent 2008 in [place] 04/02/2012  . Hypertension     Past Medical History:  Past Medical History:  Diagnosis Date  . CAD (coronary artery disease)    a. prior MI in 2006 b. 03/2012: 30% ostial LM stenosis, 30% pLAD stenosis, 60% mid-LADm 30% pCx, 3rd OM which was small in size with diffuse 90% stenosis involving both branches, and RCA with 50% mid-stenosis and 100% distal occlusion. POBA to PL branch and DES placement to dRCA.  . Claudication ([place])   . Coronary artery disease    MI in 2008 with PCI to ? vessel; NSTEMI 03/2012; NSTEMI 04/2012 Cath showed triple vessel dz including occluded RCA, s/p DES to distal RCA, PTCA posterolateral branch   . CVA (cerebral infarction)    03/2012 with left arm weakness/numbness and left facial droop.  [name] ETOH abuse    quit 04/06/12  . ETOH abuse   . History of stroke 03/2015   right frontal hemorrhage  . HTN (hypertension)   . Hyperlipidemia   . Hypertension   . Ischemic cardiomyopathy    04/2012 EF 35-40%  . Ischemic  cardiomyopathy    a. EF 40-45% by echo in 03/2015  . Seizures ([place])   . Stroke ([place])   . Tobacco abuse    quit 04/06/12  . Tobacco abuse   . Withdrawal seizures [facility])    Past Surgical History:  Past Surgical History:  Procedure Laterality Date  . CORONARY ANGIOPLASTY WITH STENT PLACEMENT  03/2012  . HERNIA REPAIR    . LEFT HEART CATHETERIZATION WITH CORONARY ANGIOGRAM N/A 04/11/2012   Procedure: LEFT HEART CATHETERIZATION WITH CORONARY ANGIOGRAM;  Surgeon: Burnell Blanks, MD;  Location: [facility] CATH LAB;  Service: Cardiovascular;  Laterality: N/A;  . PERCUTANEOUS CORONARY INTERVENTION-BALLOON ONLY  04/11/2012   Procedure: PERCUTANEOUS CORONARY INTERVENTION-BALLOON ONLY;  Surgeon: Burnell Blanks, MD;  Location: [facility] CATH LAB;  Service: Cardiovascular;;  RPLA  . PERCUTANEOUS CORONARY STENT INTERVENTION (PCI-S)  04/11/2012   Procedure: PERCUTANEOUS CORONARY STENT INTERVENTION (PCI-S);  Surgeon: Burnell Blanks, MD;  Location: [facility] CATH LAB;  Service: Cardiovascular;;  Distal RCA    Assessment & Plan Clinical Impression: Patient is a 65 y.o. year old male with recent admission to the hospital on 03/23/16 with worsening of dyspnea. EMS found patient unresponsive on the ground with blood sputum but with pulse. He went cardiac arrest--PEA treated with CPR x 6 minutes and epinephrine with ROSC. He was acidotic at admission and intubated and started on IV antibiotics. UDS positive for benzos and THC. MRI brain revealed subacute hematoma right frontal lobe--31 mm.   Patient transferred to CIR on 03/31/2016 .    Patient currently requires max with basic self-care skills secondary to muscle weakness, impaired timing and sequencing, unbalanced muscle activation, motor apraxia, decreased coordination and decreased motor planning, decreased visual acuity and decreased visual motor skills, decreased motor planning, decreased awareness, decreased problem solving and decreased memory and decreased standing balance, decreased postural control, hemiplegia and decreased balance strategies.  Prior to hospitalization, patient could complete ADLs with modified independent .  Patient will benefit from skilled intervention to decrease level of assist with basic self-care skills and increase independence with basic self-care skills prior to discharge home with care partner.  Anticipate patient will require 24 hour supervision and follow up home health.  OT - End of  Session Activity Tolerance: Tolerates 30+ min activity with multiple rests Endurance Deficit: Yes OT Assessment Rehab Potential (ACUTE ONLY): Good OT Patient demonstrates impairments in the following area(s): Balance;Cognition;Motor;Perception;Vision;Safety OT Basic ADL's Functional Problem(s): Eating;Grooming;Bathing;Dressing;Toileting OT Transfers Functional Problem(s): Toilet;Tub/Shower OT Additional Impairment(s): Fuctional Use of Upper Extremity OT Plan OT Intensity: Minimum of 1-2 x/day, 45 to 90 minutes OT Frequency: 5 out of 7 days OT Duration/Estimated Length of Stay: 14-16 days OT Treatment/Interventions: Balance/vestibular training;Cognitive remediation/compensation;Community reintegration;Disease mangement/prevention;Discharge planning;Functional mobility training;Neuromuscular re-education;Psychosocial support;Patient/family education;Self Care/advanced ADL retraining;Therapeutic Exercise;Therapeutic Activities;UE/LE Coordination activities;Visual/perceptual remediation/compensation;UE/LE Strength taining/ROM OT Self Feeding Anticipated Outcome(s): supervision OT Basic Self-Care Anticipated Outcome(s): supervision OT Toileting Anticipated Outcome(s): supervision OT Bathroom Transfers Anticipated Outcome(s): supervision OT Recommendation Follow Up Recommendations: Home health OT Equipment Recommended: To be determined   Skilled Therapeutic Intervention Began working on selfcare retraining sit to stand at the sink during session.  Pt with significant motor planning deficits noted with BUE use when attempting to open items during bathing, donn clothing, or wring out wash cloth.  He needed max assist to complete all of these tasks.  Min assist for sit to stand during transfers and LB selfcare.  Decreased vision as well as questionable visual processing.  Pt incorrect on identifying the correct number  of fingers therapist was holding up in both the left and right hemispheres and was  only correct at midline.  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  Difficulty noted with contrast of white objects on the sink as well.  Pt reports being able to see the TV somewhat but not being able to see smaller print.  Will continue to further evaluate during treatment.  Pt left in bed at end of session with alarm turned on and soft touch call button in reach.    OT Evaluation Precautions/Restrictions  Precautions Precautions: Fall Restrictions Weight Bearing Restrictions: No  Pain Pain Assessment Pain Assessment: No/denies pain Home Living/Prior Functioning Home Living Living Arrangements: Other relatives Available Help at Discharge: Family, Available 24 hours/day Type of Home: House Home Access: Stairs to enter [employer] of Steps: 4 Entrance Stairs-Rails: Right, Left, Can reach both Home Layout: One level Bathroom Shower/Tub: Tub/shower unit, Industrial/product designer: Yes  Lives With: Family Prior Function Level of Independence: Independent with basic ADLs, Independent with gait, Independent with transfers  Able to Take Stairs?: Yes Driving: Yes Vocation: On disability ADL  See Function Section of chart for details  Vision/Perception  Vision- History Baseline Vision/History: Glaucoma Patient Visual Report: Blurring of vision Vision- Assessment Vision Assessment?: Yes Eye Alignment: Within Functional Limits Alignment/Gaze Preference: Within Defined Limits Tracking/Visual Pursuits: Decreased smoothness of vertical tracking;Decreased smoothness of horizontal tracking;Other (comment) (Pt with jerky tracking and delayed in both the right and left eyes during testing.) Saccades: Impaired - to be further tested in functional context Additional Comments: Pt inconsistent with identifying the correct number of fingers in both the right and left  hemispheres but more accurate  at midline.  At times could not even see objects such as the bottle of soap sitting on the sink.  Praxis Praxis-Other Comments: Pt with moderate motor planning deficits noted with attempted use of BUEs.   Cognition Overall Cognitive Status: Impaired/Different from baseline Arousal/Alertness: Awake/alert Orientation Level: Person;Situation;Place Person: Oriented Place: Oriented Situation: Oriented Year: 2017 Month: December Day of Week: Incorrect Memory: Impaired Memory Impairment: Storage deficit;Decreased recall of new information Immediate Memory Recall: Sock;Blue;Bed Memory Recall: Sock;Bed Memory Recall Sock: Without Cue Memory Recall Bed: Without Cue Attention: Sustained Sustained Attention: Impaired Sustained Attention Impairment: Functional basic;Functional complex Awareness: Impaired Awareness Impairment: Anticipatory impairment Problem Solving: Impaired Problem Solving Impairment: Other (comment) (At baseline) Executive Function: Organizing Organizing: Impaired Safety/Judgment: Impaired Sensation Sensation Light Touch: Appears Intact (intact with gross testing of UE) Stereognosis: Appears Intact Proprioception: Impaired Detail Proprioception Impaired Details: Impaired RUE;Impaired RLE Coordination Gross Motor Movements are Fluid and Coordinated: No Fine Motor Movements are Fluid and Coordinated: No Coordination and Movement Description: Pt with Brunstrum stage IV-V movement in the LUE and decreased motor planning in the right for all tasks.  He could use the LUE for washing some but could not open soap or wring out washcloth even though full gross AROM is present.  Motor  Motor Motor: Hemiplegia;Motor apraxia Motor - Skilled Clinical Observations: RUE hemiparesis noted with motor planning deficits also with BUE use.  Mobility  Bed Mobility Bed Mobility: Sit to Supine Sit to Supine: 4: Min assist;HOB flat Transfers Transfers: Sit  to Stand;Stand to Sit Sit to Stand: 4: Min assist;With upper extremity assist;With armrests Stand to Sit: 4: Min assist;With armrests;With upper extremity assist  Trunk/Postural Assessment  Cervical Assessment Cervical Assessment: Within Functional Limits Thoracic Assessment  Thoracic Assessment: Within Functional Limits Lumbar Assessment Lumbar Assessment: Exceptions to [facility] (posterior pelvic tilt) Postural Control Righting Reactions: delayed  Balance Balance Balance Assessed: Yes Static Sitting Balance Static Sitting - Balance Support: Feet supported;No upper extremity supported Static Sitting - Level of Assistance: 5: Stand by assistance Dynamic Sitting Balance Dynamic Sitting - Balance Support: Feet supported Dynamic Sitting - Level of Assistance: 5: Stand by assistance Static Standing Balance Static Standing - Balance Support: During functional activity;No upper extremity supported Static Standing - Level of Assistance: 4: Min assist Dynamic Standing Balance Dynamic Standing - Balance Support: During functional activity;No upper extremity supported Dynamic Standing - Level of Assistance: 3: Mod assist Extremity/Trunk Assessment RUE Assessment RUE Assessment: Exceptions to [facility] RUE Strength RUE Overall Strength Comments: Pt demonstrates Brunnstrum stage IV to V movement from previous CVA but exhibit full AROM digit flexion/extension but decreased opposition thumb to 4th and 5th digits.   LUE Assessment LUE Assessment: Exceptions to [facility] LUE Strength LUE Overall Strength Comments: Pt with full AROM during gross testing with strength 4/5 but demonstrates significant motor planning when attempting to use functionally during selfcare tasks.    See Function Navigator for Current Functional Status.   Refer to Care Plan for Long Term Goals  Recommendations for other services: None    Discharge Criteria: Patient will be discharged from OT if patient refuses treatment 3  consecutive times without medical reason, if treatment goals not met, if there is a change in medical status, if patient makes no progress towards goals or if patient is discharged from hospital.  The above assessment, treatment plan, treatment alternatives and goals were discussed and mutually agreed upon: by patient  Lavere Stork OTR/L 04/01/2016, 4:56 PM

## 2016-04-02 ENCOUNTER — Inpatient Hospital Stay (HOSPITAL_COMMUNITY): Payer: Medicare HMO | Admitting: Occupational Therapy

## 2016-04-02 ENCOUNTER — Inpatient Hospital Stay (HOSPITAL_COMMUNITY): Payer: Medicare Other | Admitting: Physical Therapy

## 2016-04-02 NOTE — Progress Notes (Signed)
Physical Therapy Session Note  Patient Details  Name: Randall Patel MRN: [id number] Date of Birth: 1950/08/18  Today's Date: 04/02/2016 PT Individual Time: 4967-5916 PT Individual Time Calculation (min): 17 min    Short Term Goals: Week 1:  PT Short Term Goal 1 (Week 1): Pt will perform functional transfers with min A PT Short Term Goal 2 (Week 1): pt will demo emergent awareness with mod A PT Short Term Goal 3 (Week 1): pt will demo dynamic sitting balance with supervision  Skilled Therapeutic Interventions/Progress Updates:    Pt resting in bed on arrival, no c/o pain, states he thought he was finished with therapy today because he'd just gotten back and started to doze off.  Pt agreeable to attempt therapy with encouragement.  Pt transitions supine>sit with min assist using bedrails and HOB elevated.  Stand/pivot to w/c with close supervision and PT propelled pt to [employer].  Attempted dynavision, mode A, for standing tolerance, balance, visual scanning, and functional use of UEs.  On first trial pt did not strike any lights, though was scanning board so unsure if performance was 2/2 poor vision, apraxia, or lack of understanding of directions.  On second trial, pt able to strike 3 lights with reaction time of 20 seconds, requiring min assist to fully elevate RUE to strike lights.  Pt declining further therapy at this time, requesting to return to room.  Pt performed ambulatory transfer back to bed with supervision and min verbal cues for approximation to bed.  Min assist to position in bed, call bell in reach and needs met.   Therapy Documentation Precautions:  Precautions Precautions: Fall Precaution Comments: motor planning deficits Restrictions Weight Bearing Restrictions: No General: PT Amount of Missed Time (min): 13 Minutes PT Missed Treatment Reason: Patient fatigue   See Function Navigator for Current Functional Status.   Therapy/Group: Individual Therapy  Earnest Conroy  [place]Crew 04/02/2016, 4:20 PM

## 2016-04-02 NOTE — Progress Notes (Signed)
Physical Therapy Session Note  Patient Details  Name: Randall Patel MRN: [id number] Date of Birth: Oct 18, 1950  Today's Date: 04/02/2016 PT Individual Time: 0900-1000 PT Individual Time Calculation (min): 60 min    Short Term Goals: Week 1:  PT Short Term Goal 1 (Week 1): Pt will perform functional transfers with min A PT Short Term Goal 2 (Week 1): pt will demo emergent awareness with mod A PT Short Term Goal 3 (Week 1): pt will demo dynamic sitting balance with supervision  Skilled Therapeutic Interventions/Progress Updates:   Pt received supine in bed, c/o pain in mouth "where I keep biting it when I try to eat", and agreeable to treatment. Oriented to all but day of the week (Wednesday). Supine>sit with minA for L trunk rotation and scooting L hip toward EOB. Pt performed dressing at EOB; modA for upper body, maxA non-skid socks and lower body dressing. All dressing tasks require increased time due to ataxia, LUE incoordination, apraxia and apparent visual deficits. In sitting, occasional LOB to L side able to recover without assist, however increased time to recognize LOB and initiate recovery. Stand pivot transfer bed <>w/c with close S. Once sitting in w/c, pt c/o headache and BP assessed 156/110. RN alerted who arrived promptly to administer BP medication. Pt returned to bed with stand pivot min guard. Supine rest break while therapist obtained smaller w/c and w/c cushion. Bridging 2x10 reps with mod tactile cueing for use of LLE. Remained supine in bed at end of session, alarm intact and all needs in reach.   Therapy Documentation Precautions:  Precautions Precautions: Fall Restrictions Weight Bearing Restrictions: No Pain: Pain Assessment Pain Assessment: 0-10 Pain Score: 8  Pain Type: Acute pain Pain Location: Mouth Pain Orientation: Left Pain Descriptors / Indicators: Sore Pain Onset: Other (Comment) (with eating) Pain Intervention(s): RN made aware Multiple Pain Sites:  No   See Function Navigator for Current Functional Status.   Therapy/Group: Individual Therapy  Vista Lawman 04/02/2016, 9:55 AM

## 2016-04-02 NOTE — Progress Notes (Signed)
Occupational Therapy Session Note  Patient Details  Name: Randall Patel MRN: [id number] Date of Birth: [birth year]  Today's Date: 04/02/2016 OT Individual Time: 1003-1103 OT Individual Time Calculation (min): 60 min     Short Term Goals: Week 1:  OT Short Term Goal 1 (Week 1): Pt will complete UB bathing with min assist sitting unsupported.  OT Short Term Goal 2 (Week 1): Pt will complete LB bathing with min assist sit to stand.  OT Short Term Goal 3 (Week 1): Pt will donn pullover shirt with min assist.  OT Short Term Goal 4 (Week 1): Pt will complete LB dressing with mod assist sit to stand.  Skilled Therapeutic Interventions/Progress Updates:    Pt completed shower and dressing during session.  Max assist needed to take off paper clothing for shower with min assist for actual transfer.  Pt with decreased ability to step into the walk-in shower space secondary to visual deficits and needed mod instructional cueing to step in far enough to sit down.  Motor planning deficits noted with attempted application of soap on the washcloth so therapist helped set this up.  He was able to wash chest and abdomen as well as the LUE with mod instructional cueing for thoroughness.  He needed assistance with washing the RUE however as well as his lower legs.  He was able to stand and wash his peri area with min guard assist after setup of washcloth.  Pt donned gowns and brief at end of session as there was no clean clothing available at this time.  He needed max assist for donning gripper socks as well as for combing his hair secondary to motor planning and dropping comb when he attempted this.  Pt left at bedside in wheelchair with call button in reach.    Therapy Documentation Precautions:  Precautions Precautions: Fall Precaution Comments: motor planning deficits Restrictions Weight Bearing Restrictions: No  Pain:  No report of pain  ADL: See Function Navigator for Current Functional  Status.   Therapy/Group: Individual Therapy  Kalven Ganim OTR/L 04/02/2016, 3:50 PM

## 2016-04-02 NOTE — Progress Notes (Signed)
Physical Therapy Session Note  Patient Details  Name: Randall Patel MRN: [id number] Date of Birth: [birth year]  Today's Date: 04/02/2016 PT Individual Time: 1435-1530   PT Individual Time Calculation: 55min    Short Term Goals: Week 1:  PT Short Term Goal 1 (Week 1): Pt will perform functional transfers with min A PT Short Term Goal 2 (Week 1): pt will demo emergent awareness with mod A PT Short Term Goal 3 (Week 1): pt will demo dynamic sitting balance with supervision  Skilled Therapeutic Interventions/Progress Updates:     Patient received supine, asleep in bed and agreeable to PT.  Min assist to come to sitting EOB. Mod cues for BLE management. Once sitting EOB, patient noted to be more alert.  Stand pivot [facility] assist . Sitting BP take 146/94, standing BP taken, 125/82. Patient had no complaints of HA or dizziness throughout treatment.  PT instructed patient in WC mobility x 6850ft with mod assist due to LUE weakness. then Transported to gym to time management.   PT instructed patient Sharlene MottsBerg balance test. Patient demonstrates increased fall risk as noted by score of   36/56 on Berg Balance Scale.  (<36= high risk for falls, close to 100%; 37-45 significant >80%; 46-51 moderate >50%; 52-55 lower >25%)  Gait training in rehab gym without AD x 18880ft with min assist overall and mod assist x 2 with turns to prevent LOB. Patient noted to have several near LOB to the R, but able self correct without additional assist from PT.   Dynamic gait training with no AD to weave through 6 cones x 4. Patient able to clear all cone when turning R, but unable to avoid cones on L even with Mod cues from PT Min assist overall for dynamic gait training.   [facility] and then reports need for BM. Toilet transfer with min assist from PT and mod assist to manage clothing. Patient left sitting on toilet with NT aware of pt position.     Therapy Documentation Precautions:   Precautions Precautions: Fall Restrictions Weight Bearing Restrictions: No General:   Vital Signs: Therapy Vitals Temp: 98.9 F (37.2 C) Temp Source: Oral Pulse Rate: 78 Resp: 16 BP: 138/88 Patient Position (if appropriate): Lying Oxygen Therapy SpO2: 100 % O2 Device: Not Delivered Pain:   0/10   Balance: Standardized Balance Assessment Standardized Balance Assessment: Berg Balance Test Berg Balance Test Sit to Stand: Able to stand  independently using hands Standing Unsupported: Able to stand safely 2 minutes Sitting with Back Unsupported but Feet Supported on Floor or Stool: Able to sit safely and securely 2 minutes Stand to Sit: Controls descent by using hands Transfers: Able to transfer safely, definite need of hands Standing Unsupported with Eyes Closed: Able to stand 10 seconds with supervision Standing Ubsupported with Feet Together: Able to place feet together independently and stand for 1 minute with supervision From Standing, Reach Forward with Outstretched Arm: Can reach forward >5 cm safely (2") From Standing Position, Pick up Object from Floor: Able to pick up shoe, needs supervision From Standing Position, Turn to Look Behind Over each Shoulder: Turn sideways only but maintains balance Turn 360 Degrees: Needs close supervision or verbal cueing Standing Unsupported, Alternately Place Feet on Step/Stool: Able to complete 4 steps without aid or supervision Standing Unsupported, One Foot in Front: Able to take small step independently and hold 30 seconds Standing on One Leg: Tries to lift leg/unable to hold 3 seconds  but remains standing independently Total Score: 36   See Function Navigator for Current Functional Status.   Therapy/Group: Individual Therapy  Golden Pop 04/02/2016, 8:01 AM

## 2016-04-03 ENCOUNTER — Inpatient Hospital Stay (HOSPITAL_COMMUNITY): Payer: Medicare HMO

## 2016-04-03 ENCOUNTER — Inpatient Hospital Stay (HOSPITAL_COMMUNITY): Payer: Medicare Other | Admitting: Occupational Therapy

## 2016-04-03 ENCOUNTER — Inpatient Hospital Stay (HOSPITAL_COMMUNITY): Payer: Medicare Other | Admitting: Physical Therapy

## 2016-04-03 DIAGNOSIS — I6930 Unspecified sequelae of cerebral infarction: Secondary | ICD-10-CM

## 2016-04-03 DIAGNOSIS — I2510 Atherosclerotic heart disease of native coronary artery without angina pectoris: Secondary | ICD-10-CM

## 2016-04-03 DIAGNOSIS — J449 Chronic obstructive pulmonary disease, unspecified: Secondary | ICD-10-CM

## 2016-04-03 DIAGNOSIS — R1310 Dysphagia, unspecified: Secondary | ICD-10-CM

## 2016-04-03 DIAGNOSIS — I10 Essential (primary) hypertension: Secondary | ICD-10-CM

## 2016-04-03 MED ORDER — ATORVASTATIN CALCIUM 80 MG PO TABS
80.0000 mg | ORAL_TABLET | ORAL | Status: DC
  Administered 2016-04-03 – 2016-04-13 (×11): 80 mg via ORAL
  Filled 2016-04-03 (×14): qty 1

## 2016-04-03 MED ORDER — HYDRALAZINE HCL 50 MG PO TABS
75.0000 mg | ORAL_TABLET | Freq: Three times a day (TID) | ORAL | Status: DC
  Administered 2016-04-03 – 2016-04-14 (×33): 75 mg via ORAL
  Filled 2016-04-03 (×33): qty 1

## 2016-04-03 NOTE — Progress Notes (Signed)
Physical Therapy Session Note  Patient Details  Name: Randall Patel MRN: [id number] Date of Birth: 03-10-1951  Today's Date: 04/03/2016 PT Individual Time: 1100-1200 PT Individual Time Calculation (min): 60 min    Short Term Goals: Week 1:  PT Short Term Goal 1 (Week 1): Pt will perform functional transfers with min A PT Short Term Goal 2 (Week 1): pt will demo emergent awareness with mod A PT Short Term Goal 3 (Week 1): pt will demo dynamic sitting balance with supervision  Skilled Therapeutic Interventions/Progress Updates:   Pt received seated in w/c, denies pain and agreeable to treatment. Transported to gym totalA in w/c for energy conservation. Gait x200' with min guard overall, one minor LOB requiring minA to recover. Dynamic standing balance while engaged in playing horseshoes, including gait in all directions and retrieving horseshoes off floor with min guard. Performed nustep x10 min with BUE/BLE level 5 with average 30 steps/min; required cues to integrate RUE into activity, and LUE placed in hand splint to maintain grip. Gait to return to room x175' with min guard. Sit >supine with S; mod cues for pt to obtain midline alignment in bed, instructed pt in hand placement and bridging to assist. Remained supine in bed with alarm intact and all needs in reach.   Therapy Documentation Precautions:  Precautions Precautions: Fall Precaution Comments: motor planning deficits Restrictions Weight Bearing Restrictions: No   See Function Navigator for Current Functional Status.   Therapy/Group: Individual Therapy  Vista Lawman 04/03/2016, 12:10 PM

## 2016-04-03 NOTE — Progress Notes (Signed)
Occupational Therapy Session Note  Patient Details  Name: Randall Patel MRN: [id number] Date of Birth: 1950-11-25  Today's Date: 04/03/2016 OT Individual Time: 1188-6773 OT Individual Time Calculation (min): 79 min    Short Term Goals: Week 1:  OT Short Term Goal 1 (Week 1): Pt will complete UB bathing with min assist sitting unsupported.  OT Short Term Goal 2 (Week 1): Pt will complete LB bathing with min assist sit to stand.  OT Short Term Goal 3 (Week 1): Pt will donn pullover shirt with min assist.  OT Short Term Goal 4 (Week 1): Pt will complete LB dressing with mod assist sit to stand.    Skilled Therapeutic Interventions/Progress Updates:   Pt was sitting in w/c at time of arrival with safety belt donned. He was agreeable to shower. Pt ambulated into bathroom with HHA, cues for avoiding environmental obstacles and reacting appropriately to semi-closed door. Pt educated on using tactile cues for locating shower seat due to pt attempting to sit without surface behind him. IV site covered. Proprioception/vision/UE coordination/motor planning deficits limited pts self care abilities. With extra time for motor planning, pt was able to wash most of UB as well as self initiate pericare hygiene. Pt set up 1 wash cloth with soap today, but max cues for locating items and a few wash cloths were dropped before pt was successful. Pt may benefit from shower modifications to increase visual contrast to compensate for visual deficits. Dressing was then completed at EOB with setup of paper clothing. He completed UB/LB dressing with overall Max A due to time/tangential tendencies. Pt was then transferred to w/c at end of session with HHA. Safety belt donned and all needs within reach at time of departure.     Therapy Documentation Precautions:  Precautions Precautions: Fall Precaution Comments: motor planning deficits Restrictions Weight Bearing Restrictions: No  Pain: No c/o pain during session     ADL:  :    See Function Navigator for Current Functional Status.   Therapy/Group: Individual Therapy  Shaunice Levitan A [place] 04/03/2016, 12:44 PM

## 2016-04-03 NOTE — Progress Notes (Addendum)
Subjective/Complaints: Pt seen laying in bed this AM.  He states he "just want to sleep".  He denies issues.   ROS: Denies CP, SOB, N/V/D  Objective: Vital Signs: Blood pressure (!) 143/97, pulse 70, temperature 98.8 F (37.1 C), temperature source Oral, resp. rate 16, height '5\' 5"'  (1.651 m), weight 61.5 kg (135 lb 8 oz), SpO2 99 %. No results found. Results for orders placed or performed during the hospital encounter of 03/31/16 (from the past 72 hour(s))  Comprehensive metabolic panel     Status: None   Collection Time: 04/01/16  6:18 AM  Result Value Ref Range   Sodium 138 135 - 145 mmol/L   Potassium 3.6 3.5 - 5.1 mmol/L   Chloride 104 101 - 111 mmol/L   CO2 28 22 - 32 mmol/L   Glucose, Bld 99 65 - 99 mg/dL   BUN 12 6 - 20 mg/dL   Creatinine, Ser 0.84 0.61 - 1.24 mg/dL   Calcium 9.8 8.9 - 10.3 mg/dL   Total Protein 7.8 6.5 - 8.1 g/dL   Albumin 3.5 3.5 - 5.0 g/dL   AST 22 15 - 41 U/L   ALT 21 17 - 63 U/L   Alkaline Phosphatase 46 38 - 126 U/L   Total Bilirubin 0.9 0.3 - 1.2 mg/dL   GFR calc non Af Amer >60 >60 mL/min   GFR calc Af Amer >60 >60 mL/min    Comment: (NOTE) The eGFR has been calculated using the CKD EPI equation. This calculation has not been validated in all clinical situations. eGFR's persistently <60 mL/min signify possible Chronic Kidney Disease.    Anion gap 6 5 - 15  CBC WITH DIFFERENTIAL     Status: Abnormal   Collection Time: 04/01/16  6:18 AM  Result Value Ref Range   WBC 6.0 4.0 - 10.5 K/uL   RBC 4.85 4.22 - 5.81 MIL/uL   Hemoglobin 13.2 13.0 - 17.0 g/dL   HCT 39.8 39.0 - 52.0 %   MCV 82.1 78.0 - 100.0 fL   MCH 27.2 26.0 - 34.0 pg   MCHC 33.2 30.0 - 36.0 g/dL   RDW 14.2 11.5 - 15.5 %   Platelets 336 150 - 400 K/uL   Neutrophils Relative % 57 %   Neutro Abs 3.5 1.7 - 7.7 K/uL   Lymphocytes Relative 23 %   Lymphs Abs 1.4 0.7 - 4.0 K/uL   Monocytes Relative 18 %   Monocytes Absolute 1.1 (H) 0.1 - 1.0 K/uL   Eosinophils Relative 1 %   Eosinophils Absolute 0.1 0.0 - 0.7 K/uL   Basophils Relative 1 %   Basophils Absolute 0.0 0.0 - 0.1 K/uL      General: No acute distress. Vital signs reviewed.  Psych: Normal affect and Mood  Heart: Regular rate and rhythm. No JVD. Lungs: Clear to auscultation, breathing unlabored Abdomen: Positive bowel sounds, soft  Skin: No evidence of breakdown, no evidence of rash Neurologic:  Motor: 4/5 in R deltoid, bicep, tricep, grip, Bilateral hip flexor, knee extensors, ankle dorsiflexor and plantar flexor 3/5 in left delt biceps triceps grip Alert and oriented Musculoskeletal: No edema. No tenderness.   Assessment/Plan: 1. 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 Physiatrist is providing close team supervision and 24 hour management of active medical problems listed below. Physiatrist and rehab team continue to assess barriers to discharge/monitor patient progress toward functional and  medical goals. FIM: Function - Bathing Position: Shower Body parts bathed by patient: Left arm, Chest, Abdomen, Front perineal area, Buttocks, Right upper leg, Left upper leg Body parts bathed by helper: Right lower leg, Left lower leg, Back, Right arm  Function- Upper Body Dressing/Undressing What is the patient wearing?: Hospital gown Pull over shirt/dress - Perfomed by patient: Thread/unthread right sleeve Pull over shirt/dress - Perfomed by helper: Thread/unthread left sleeve, Put head through opening, Pull shirt over trunk Assist Level: Touching or steadying assistance(Pt > 75%) Function - Lower Body Dressing/Undressing What is the patient wearing?: Non-skid slipper socks Position: Wheelchair/chair at sink Pants- Performed by patient: Thread/unthread right pants leg Pants- Performed by helper: Thread/unthread left pants leg, Pull pants up/down Non-skid slipper  socks- Performed by helper: Don/doff right sock, Don/doff left sock  Function - Toileting Toileting activity did not occur: No continent bowel/bladder event Toileting steps completed by helper: Adjust clothing prior to toileting, Performs perineal hygiene, Adjust clothing after toileting Toileting Assistive Devices: Grab bar or rail Assist level: Two helpers  Function - Air cabin crew transfer activity did not occur: Safety/medical concerns  Function - Chair/bed transfer Chair/bed transfer method: Stand pivot Chair/bed transfer assist level: Supervision or verbal cues Chair/bed transfer assistive device: Armrests Chair/bed transfer details: Verbal cues for precautions/safety, Verbal cues for technique  Function - Locomotion: Wheelchair Type: Manual Max wheelchair distance: 50 Assist Level: Moderate assistance (Pt 50 - 74%) Assist Level: Moderate assistance (Pt 50 - 74%) Turns around,maneuvers to table,bed, and toilet,negotiates 3% grade,maneuvers on rugs and over doorsills: No Function - Locomotion: Ambulation Assistive device: No device Max distance: 180 Assist level: Moderate assist (Pt 50 - 74%) Assist level: Touching or steadying assistance (Pt > 75%) Assist level: Touching or steadying assistance (Pt > 75%) Assist level: Moderate assist (Pt 50 - 74%) Assist level: Moderate assist (Pt 50 - 74%)  Function - Comprehension Comprehension: Auditory Comprehension assist level: Understands basic 75 - 89% of the time/ requires cueing 10 - 24% of the time  Function - Expression Expression: Verbal Expression assist level: Expresses basic 75 - 89% of the time/requires cueing 10 - 24% of the time. Needs helper to occlude trach/needs to repeat words.  Function - Social Interaction Social Interaction assist level: Interacts appropriately 75 - 89% of the time - Needs redirection for appropriate language or to initiate interaction.  Function - Problem Solving Problem solving  assist level: Solves basic 50 - 74% of the time/requires cueing 25 - 49% of the time  Function - Memory Memory assist level: Recognizes or recalls 50 - 74% of the time/requires cueing 25 - 49% of the time Patient normally able to recall (first 3 days only): Current season, That he or she is in a hospital  Medical Problem List and Plan: 1.  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  CIR CIR 2.  DVT Prophylaxis/Anticoagulation: Pharmaceutical: Lovenox 3. Pain Management: Tylenol prn 4. Mood:  LCSW to follow for evaluation and support.  5. Neuropsych: This patient is not fully capable of making decisions on his own behalf. 6. Skin/Wound Care: routine pressure relief measures.  7. Fluids/Electrolytes/Nutrition: Monitor I/O.   BMP WNL on 12/21 8. CAD: Off ASA.  Continue metoprolol.   ?Low dose losartan for better BP control   Added statin.  9. PEA: Felt to be due to respiratory event, overdose may have been initiating event with aspiration PNA---cardiology has signed off.  10. HTN: Monitor BP bid. Titrate  medications as indicated.   Cont Norvasc 10  Cont metoprolol 100 BID  Con aldactone 25  Hydralizine increased to 75 on 12/23 11. COPD with ongoing tobacco abuse: Respiratory status stable.  12. Dysphagia:  Continue dysphagia 2, thin liquids with full supervision for safety.  13. Leucocytosis: resolved WBC 6.0 on 12/21 14.  Hx Polysub abuse- asked neuropsych to eval  LOS (Days) 3 A FACE TO FACE EVALUATION WAS PERFORMED  Ankit Lorie Phenix 04/03/2016, 10:45 AM

## 2016-04-03 NOTE — Progress Notes (Signed)
Occupational Therapy Session Note  Patient Details  Name: Randall Patel MRN: [id number] Date of Birth: October 19, 1950  Today's Date: 04/03/2016 OT Group Time: 1400-1500 OT Group Time Calculation (min): 60 min  Short Term Goals: Week 1:  OT Short Term Goal 1 (Week 1): Pt will complete UB bathing with min assist sitting unsupported.  OT Short Term Goal 2 (Week 1): Pt will complete LB bathing with min assist sit to stand.  OT Short Term Goal 3 (Week 1): Pt will donn pullover shirt with min assist.  OT Short Term Goal 4 (Week 1): Pt will complete LB dressing with mod assist sit to stand.  Skilled Therapeutic Interventions/Progress Updates:   Therapeutic activity group with focus on dynamic standing balance, improved awareness of body control, center of balance, and compensatory strategies to regain balance.  Pt received by OT for escort to group but requested assist to bathroom to toilet for BM.   Pt escorted to toilet w/o device required and transferred with standby assist.   Pt instructed to call for assist from RN tech after toileting and was received to group shortly afterwards.   Pt and group member were then educated on HEP using Nintendo Will with balance board as means to reveal balance deficits and engage in balance games (tilt table) to practice strategies.   Pt required extra time to orient to activity and 1:1 assist to step on/off balance board but completed body test, awareness test, and balance test w/o evidence of LOB.   Pt advanced to level 2 at tilt table game at the end of his participation in session.  Therapy Documentation Precautions:  Precautions Precautions: Fall Precaution Comments: motor planning deficits Restrictions Weight Bearing Restrictions: No   Vital Signs: Therapy Vitals Temp: 99.2 F (37.3 C) (affter eating) Temp Source: Oral Pulse Rate: 60 Resp: 16 BP: (!) 147/81 Patient Position (if appropriate): Sitting Oxygen Therapy SpO2: 100 % O2 Device: Not  Delivered   Pain: No/denies pain     See Function Navigator for Current Functional Status.   Therapy/Group: Group Therapy  Mickenzie Stolar 04/03/2016, 4:50 PM

## 2016-04-03 NOTE — IPOC Note (Signed)
Overall Plan of Care [facility]) Patient Details Name: Randall Patel MRN: [id number] DOB: 08-21-1950  Admitting Diagnosis: Amoxic [facility] Problems: Active Problems:   Anoxic brain injury [facility])     Functional Problem List: Nursing Behavior, Bladder, Medication Management, Motor, Nutrition, Perception, Safety, Sensory  PT Balance, Endurance, Motor, Safety, Sensory  OT Balance, Cognition, Motor, Perception, Vision, Safety  SLP Other (comment) (Baseline deficits in the areas of cognition, safety, nutrition)  TR         Basic ADL's: OT Eating, Grooming, Bathing, Dressing, Toileting     Advanced  ADL's: OT       Transfers: PT Bed Mobility, Bed to Chair, Car, Furniture, Floor  OT Toilet, Tub/Shower     Locomotion: PT Stairs, Psychologist, prison and probation services, Ambulation     Additional Impairments: OT Fuctional Use of Upper Extremity  SLP None      TR      Anticipated Outcomes Item Anticipated Outcome  Self Feeding supervision  Swallowing      Basic self-care  supervision  Toileting  supervision   Bathroom Transfers supervision  Bowel/Bladder  pt will be min assist with devices  Transfers  supervision  Locomotion  supervision  Communication     Cognition     Pain  pt will report pain less than 2  Safety/Judgment  pt will remain free from falls with mod assist   Therapy Plan: PT Intensity: Minimum of 1-2 x/day ,45 to 90 minutes PT Frequency: 5 out of 7 days PT Duration Estimated Length of Stay: 14-17 minutes OT Intensity: Minimum of 1-2 x/day, 45 to 90 minutes OT Frequency: 5 out of 7 days OT Duration/Estimated Length of Stay: 14-16 days         Team Interventions: Nursing Interventions Patient/Family Education, Bladder Management, Disease Management/Prevention, Medication Management, Cognitive Remediation/Compensation, Dysphagia/Aspiration Precaution Training  PT interventions Ambulation/gait training, Discharge planning, Cognitive remediation/compensation,  Warden/ranger, Community reintegration, Development worker, international aid stimulation, Neuromuscular re-education, Patient/family education, Museum/gallery curator, Therapeutic Exercise, UE/LE Coordination activities, Wheelchair propulsion/positioning, UE/LE Strength taining/ROM, Therapeutic Activities, Splinting/orthotics, Pain management, Functional mobility training, DME/adaptive equipment instruction  OT Interventions Warden/ranger, Cognitive remediation/compensation, Community reintegration, Disease mangement/prevention, Discharge planning, Functional mobility training, Neuromuscular re-education, Psychosocial support, Patient/family education, Self Care/advanced ADL retraining, Therapeutic Exercise, Therapeutic Activities, UE/LE Coordination activities, Visual/perceptual remediation/compensation, UE/LE Strength taining/ROM  SLP Interventions    TR Interventions    SW/CM Interventions Discharge Planning, Psychosocial Support, Patient/Family Education    Team Discharge Planning: Destination: PT-Home ,OT-   , SLP-Home Projected Follow-up: PT-Home health PT, OT-  Home health OT, SLP-None Projected Equipment Needs: PT-To be determined, OT- To be determined, SLP-None recommended by SLP Equipment Details: PT- , OT-  Patient/family involved in discharge planning: PT- Patient,  OT-Patient, SLP-Patient  MD ELOS: 12-15 days. Medical Rehab Prognosis:  Good Assessment:  65 y.o. 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 EMS found patient unresponsive on the ground with blood sputum but with pulse. He went into cardiac arrest--PEA treated with CPR x 6 minutes and epinephrine with ROSC. He was acidotic at admission and intubated and started on IV antibiotics. UDS positive for benzos and THC. MRI brain revealed subacute hematoma right frontal lobe--31 mm. Cardiology consulted and elevated  cardiac enzymes felt to be due to demand ischemia in setting of cardiac arrest. 2 D echo with EF 45-50% with hypokinesis of inferior and inferoseptal myocardium and moderately increased pulmonary pressures.  Cath precluded due to hematoma and cardiology recommended medical management.  EEG showed suggestion of global anoxic injury and Dr. Pearlean BrownieSethi felt that patient with asymptomatic ICH with incidental old finding from prior right posterior frontal hematoma with old right anterior frontal infarct from 03/2015.  Encephalopathy felt to be due to ABI and agitation has resolved. Acute pulmonary edema treated with diuresis and elevated HTN was managed with titration to oral meds.  Pt with resulting functional deficits with pusher syndrome, posterior lean, poor safety and cognitive deficits, and weakness. Will set goals for supervision for most tasks with therapies.   See Team Conference Notes for weekly updates to the plan of care

## 2016-04-04 ENCOUNTER — Inpatient Hospital Stay (HOSPITAL_COMMUNITY): Payer: Medicare Other | Admitting: Physical Therapy

## 2016-04-04 ENCOUNTER — Inpatient Hospital Stay (HOSPITAL_COMMUNITY): Payer: Medicare HMO

## 2016-04-04 ENCOUNTER — Inpatient Hospital Stay (HOSPITAL_COMMUNITY): Payer: Medicare Other | Admitting: Occupational Therapy

## 2016-04-04 DIAGNOSIS — I469 Cardiac arrest, cause unspecified: Secondary | ICD-10-CM

## 2016-04-04 NOTE — Progress Notes (Signed)
Subjective/Complaints: Pt seen laying in bed this AM. He again states he is sleepy and wants to resent.    ROS: Denies CP, SOB, N/V/D  Objective: Vital Signs: Blood pressure (!) 147/89, pulse 80, temperature 97.5 F (36.4 C), temperature source Oral, resp. rate 17, height 5\' 5"  (1.651 m), weight 61.5 kg (135 lb 8 oz), SpO2 100 %. No results found. No results found for this or any previous visit (from the past 72 hour(s)).    General: NAD. Vital signs reviewed.  Psych: Normal affect and Mood  Heart: RRR. No JVD. Lungs: Clear to auscultation, breathing unlabored Abdomen: Positive bowel sounds, soft  Skin: No evidence of breakdown, no evidence of rash Neurologic:  Motor: 4/5 in R deltoid, bicep, tricep, grip, Bilateral hip flexor, knee extensors, ankle dorsiflexor and plantar flexor 3+/5 in left delt biceps triceps grip  Alert and oriented Musculoskeletal: No edema. No tenderness.   Assessment/Plan: 1. 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 Physiatrist is providing close team supervision and 24 hour management of active medical problems listed below. Physiatrist and rehab team continue to assess barriers to discharge/monitor patient progress toward functional and medical goals. FIM: Function - Bathing Position: Shower Body parts bathed by patient: Left arm, Chest, Abdomen, Front perineal area, Buttocks, Right upper leg, Left upper leg Body parts bathed by helper: Right arm, Right lower leg, Left lower leg, Back Assist Level:  (Mod A)  Function- Upper Body Dressing/Undressing What is the patient wearing?: Pull over shirt/dress Pull over shirt/dress - Perfomed by patient: Put head through opening Pull over shirt/dress - Perfomed by helper: Thread/unthread right sleeve, Thread/unthread left sleeve, Pull shirt over trunk Assist Level:  (Max  A) Function - Lower Body Dressing/Undressing What is the patient wearing?: Non-skid slipper socks, Pants Position: Wheelchair/chair at sink Pants- Performed by patient: Thread/unthread right pants leg Pants- Performed by helper: Thread/unthread right pants leg, Thread/unthread left pants leg, Pull pants up/down Non-skid slipper socks- Performed by helper: Don/doff right sock, Don/doff left sock Assist for lower body dressing:  (Max A)  Function - Toileting Toileting activity did not occur: No continent bowel/bladder event Toileting steps completed by helper: Adjust clothing prior to toileting, Performs perineal hygiene, Adjust clothing after toileting Toileting Assistive Devices: Grab bar or rail Assist level: Two helpers  Function - ArchivistToilet Transfers Toilet transfer activity did not occur: Safety/medical concerns  Function - Chair/bed transfer Chair/bed transfer method: Ambulatory Chair/bed transfer assist level: Touching or steadying assistance (Pt > 75%) Chair/bed transfer assistive device: Armrests Chair/bed transfer details: Verbal cues for precautions/safety, Verbal cues for technique  Function - Locomotion: Wheelchair Type: Manual Max wheelchair distance: 50 Assist Level: Moderate assistance (Pt 50 - 74%) Assist Level: Moderate assistance (Pt 50 - 74%) Turns around,maneuvers to table,bed, and toilet,negotiates 3% grade,maneuvers on rugs and over doorsills: No Function - Locomotion: Ambulation Assistive device: No device Max distance: 200 Assist level: Touching or steadying assistance (Pt > 75%) Assist level: Touching or steadying assistance (Pt > 75%) Assist level: Touching or steadying assistance (Pt > 75%) Assist level: Touching or steadying assistance (Pt > 75%) Assist level: Moderate assist (Pt 50 - 74%)  Function - Comprehension Comprehension: Auditory Comprehension assist level: Understands basic 75 - 89% of the time/ requires cueing 10 - 24% of the  time  Function - Expression Expression: Verbal Expression assist level: Expresses basic 75 - 89% of the time/requires cueing  10 - 24% of the time. Needs helper to occlude trach/needs to repeat words.  Function - Social Interaction Social Interaction assist level: Interacts appropriately 75 - 89% of the time - Needs redirection for appropriate language or to initiate interaction.  Function - Problem Solving Problem solving assist level: Solves basic 50 - 74% of the time/requires cueing 25 - 49% of the time  Function - Memory Memory assist level: Recognizes or recalls 50 - 74% of the time/requires cueing 25 - 49% of the time Patient normally able to recall (first 3 days only): Current season, Location of own room, Staff names and faces, That he or she is in a hospital  Medical Problem List and Plan: 1.  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  CIR CIR 2.  DVT Prophylaxis/Anticoagulation: Pharmaceutical: Lovenox 3. Pain Management: Tylenol prn 4. Mood:  LCSW to follow for evaluation and support.  5. Neuropsych: This patient is not fully capable of making decisions on his own behalf. 6. Skin/Wound Care: routine pressure relief measures.  7. Fluids/Electrolytes/Nutrition: Monitor I/O.   BMP WNL on 12/21 8. CAD: Off ASA.  Continue metoprolol.   ?Low dose losartan for better BP control   Added statin.  9. PEA: Felt to be due to respiratory event, overdose may have been initiating event with aspiration PNA---cardiology has signed off.  10. HTN: Monitor BP bid. Titrate medications as indicated.   Cont Norvasc 10  Cont metoprolol 100 BID  Con aldactone 25  Hydralizine increased to 75 on 12/23  Improving, cont to monitor 11. COPD with ongoing tobacco abuse: Respiratory status stable.  12. Dysphagia:  Continue dysphagia 2, thin liquids with full supervision for safety.  13. Leucocytosis: resolved WBC 6.0 on 12/21 14.  Hx Polysub abuse-  asked neuropsych to eval  LOS (Days) 4 A FACE TO FACE EVALUATION WAS PERFORMED  Ankit Karis Juba 04/04/2016, 9:20 AM

## 2016-04-04 NOTE — Progress Notes (Signed)
Occupational Therapy Session Note  Patient Details  Name: Randall Patel MRN: [id number] Date of Birth: [birth year]  Today's Date: 04/04/2016 OT Individual Time: 1445-1530 OT Individual Time Calculation (min): 45 min   Short Term Goals: Week 1:  OT Short Term Goal 1 (Week 1): Pt will complete UB bathing with min assist sitting unsupported.  OT Short Term Goal 2 (Week 1): Pt will complete LB bathing with min assist sit to stand.  OT Short Term Goal 3 (Week 1): Pt will donn pullover shirt with min assist.  OT Short Term Goal 4 (Week 1): Pt will complete LB dressing with mod assist sit to stand.  Skilled Therapeutic Interventions/Progress Updates:   Cognitive skills development (20 min) with focus on attention, awareness, problem-solving and therapeutic activities (25 min) with focus on [facility] of right hand during functional task of playing Nintendo Wii bowling game.   Pt received in bed but alert although unaware of poor positioning (supine) contributing to congestion.   OT re-educated pt on use of bed rails and call light for assist with repositioning.  Pt then rose to EOB and ambulated to rehab gym w/o device as OT followed closely with w/c.   Pt was re-oriented to Nintendo Wii and selected bowling game as activity of interest for participation.   Pt required extensive education on use of Wii nunchuk controller to initiate ball controller.  With OT assist to select using A button, patient demonstrated competence with squeezing trigger (B button on controller) and releasing button to toss his ball 75% of the time.   Pt completed one game scoring 102 pts with continuous assist to place controller in his hand during exchange of players and to setup to limit his movement to armswing and release of ball.   Pt reported awareness of limitation to [facility] although enjoying activity and was educated on finger AROM exercises to improve FM at end of session.  Therapy Documentation Precautions:   Precautions Precautions: Fall Precaution Comments: motor planning deficits Restrictions Weight Bearing Restrictions: No   Vital Signs: Therapy Vitals Temp: 98.7 F (37.1 C) Temp Source: Oral Pulse Rate: 72 Resp: 16 BP: 128/80 Patient Position (if appropriate): Sitting Oxygen Therapy SpO2: 99 % O2 Device: Not Delivered   Pain: No/denies pain     See Function Navigator for Current Functional Status.   Therapy/Group: Individual Therapy  Maiko Salais 04/04/2016, 3:44 PM

## 2016-04-04 NOTE — Progress Notes (Signed)
Occupational Therapy Session Note  Patient Details  Name: Randall Patel MRN: [id number] Date of Birth: 1951/01/17  Today's Date: 04/04/2016 OT Individual Time: 1001-1115 OT Individual Time Calculation (min): 74 min    Short Term Goals: Week 1:  OT Short Term Goal 1 (Week 1): Pt will complete UB bathing with min assist sitting unsupported.  OT Short Term Goal 2 (Week 1): Pt will complete LB bathing with min assist sit to stand.  OT Short Term Goal 3 (Week 1): Pt will donn pullover shirt with min assist.  OT Short Term Goal 4 (Week 1): Pt will complete LB dressing with mod assist sit to stand.    Skilled Therapeutic Interventions/Progress Updates:   Pt was lying in bed at time of arrival, required max encouragement to participate in tx due to fatigue from previous therapy. He declined ADLs, but agreeable to puzzle activity if it was in room. He completed transfer to bedside chair with steady assist and no AD. During activity engagement, low vision modifications made to increase visual contrast/acuity and temporary glasses were provided (due to lack of magnifying glass) to increase pt success with task. Pt reported that these items helped with visual deficits. Tx focus today was remediation of tactile discrimination to serve as compensatory strategy for visual deficits. Activity also worked on bilateral UE coordination and attention. Pt educated on using tactile discrimination to identify whether puzzle pieces were edges or middle pieces with pt exhibiting 20% accuracy initially, to later completing task with 65% accuracy and increased time. Initially pt utilized compensatory methods such as  sliding pieces off of table, with pt progressing to picking pieces up with fingers and lifting off of table.    Bathroom modifications established to increase ease of pt transferring and using ADL items by increasing visual contrast. Will assess effectiveness later this week with pt educated on change. Pt then  transferred back to bed in manner as written above. Pt was left in bed with bed alarm activated and all needs within reach at time of departure.   Therapy Documentation Precautions:  Precautions Precautions: Fall Precaution Comments: motor planning deficits Restrictions Weight Bearing Restrictions: No General:   Vital Signs: Therapy Vitals Pulse Rate: 80 BP: (!) 147/89 Pain: No c/o pain during session  Pain Assessment Pain Assessment: 0-10 Pain Score: 6  Pain Type: Acute pain Pain Location: Head Pain Radiating Towards: eyes Pain Descriptors / Indicators: Aching;Headache Pain Frequency: Occasional Pain Onset: Gradual Pain Intervention(s): Medication (See eMAR) ADL:      See Function Navigator for Current Functional Status.   Therapy/Group: Individual Therapy  Randall Patel Randall Patel 04/04/2016, 12:42 PM

## 2016-04-04 NOTE — Progress Notes (Signed)
Physical Therapy Session Note  Patient Details  Name: Randall Patel MRN: [id number] Date of Birth: September 16, 1950  Today's Date: 04/04/2016 PT Individual Time: 5993-5701 PT Individual Time Calculation (min): 66 min  and Today's Date: 04/04/2016 PT Concurrent Time: 0915-0922 PT Concurrent Time Calculation (min): 7 min  Total time 73 minutes     Short Term Goals:Week 1:  PT Short Term Goal 1 (Week 1): Pt will perform functional transfers with min A PT Short Term Goal 2 (Week 1): pt will demo emergent awareness with mod A PT Short Term Goal 3 (Week 1): pt will demo dynamic sitting balance with supervision   Therapy Documentation Precautions:  Precautions Precautions: Fall Precaution Comments: motor planning deficits Restrictions Weight Bearing Restrictions: No General:   Vital Signs: Therapy Vitals Temp: 97.5 F (36.4 C) Temp Source: Oral Pulse Rate: (!) 55 Resp: 17 BP: (!) 141/74 Patient Position (if appropriate): Lying Oxygen Therapy SpO2: 100 % O2 Device: Not Delivered Pain:  Patient denies any pain  Supine to short sit in bed with head of bed elevated close supervision    Sit to and from stand transfer close supervision Verbal cues for hand placement and controlled descent.  Stand pivot transfer min assist  Patient ambulated 200 feet and 150 feet min assist.Patient ambulated with a step through gait pattern with a varied step and stride length. Increased postural sway noted. Manual facilitation for increased left step length and anterior pelvic tilt.  Patient up and down 12 steps with right handrail min assist. Patient performed stairs with a step over step apttern. One episode of mod assist secondary to catching right foot on the the lip of the steps.Patient educated on pacing and proper sequence and technique.  Patient performed cone taps for 4 minutes  in standing alternating left and right with varied assistance from min to mod focus on protective stepping  strategies. Patient did not demonstrative and protective or reactive balance strategies with verbal cues and tactile cues.   Nu-Step level 4 10 minutes   In standing  Min squats 3x10 B heel raises 3x10.  Short sit to and from supine on mat close supervision  Bridging 30x with ball squeeze and manual facilitation for LLE muscle activation.   Supine to prone close supervision. Patient then transitioned from prone to quadruped close supervision.  Patient remained in quadruped for 5 minutes. Focus on positioning in midline, even weight distribution, and strengthening/edurance.   Patient performed ambulatory transfer at end of session to standard height toilet min assist.   Patient left with nurse tech and end of session seated on toilet.     See Function Navigator for Current Functional Status.   Therapy/Group: Individual Therapy and Concurrent   Merri Ray 04/04/2016, 8:21 AM

## 2016-04-05 DIAGNOSIS — I69391 Dysphagia following cerebral infarction: Secondary | ICD-10-CM

## 2016-04-05 NOTE — Progress Notes (Signed)
Subjective/Complaints: Pt laying in bed this AM.  He his flat.  He states he is tired. He slept well.   ROS: Denies CP, SOB, N/V/D  Objective: Vital Signs: Blood pressure 140/86, pulse 61, temperature 98.4 F (36.9 C), temperature source Oral, resp. rate 18, height 5\' 5"  (1.651 m), weight 61.5 kg (135 lb 8 oz), SpO2 98 %. No results found. No results found for this or any previous visit (from the past 72 hour(s)).    General: NAD. Vital signs reviewed.  Psych: Normal affect and Mood  Heart: RRR. No JVD. Lungs: Clear to auscultation, breathing unlabored Abdomen: Positive bowel sounds, soft  Skin: No evidence of breakdown, no evidence of rash Neurologic:  Motor: 4/5 in R deltoid, bicep, tricep, grip, Bilateral hip flexor, knee extensors, ankle dorsiflexor and plantar flexor 3+/5 in left delt biceps triceps grip (?participation) Alert and oriented Musculoskeletal: No edema. No tenderness.   Assessment/Plan: 1. 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 Physiatrist is providing close team supervision and 24 hour management of active medical problems listed below. Physiatrist and rehab team continue to assess barriers to discharge/monitor patient progress toward functional and medical goals. FIM: Function - Bathing Position: Shower Body parts bathed by patient: Left arm, Chest, Abdomen, Front perineal area, Buttocks, Right upper leg, Left upper leg Body parts bathed by helper: Right arm, Right lower leg, Left lower leg, Back Assist Level:  (Mod A)  Function- Upper Body Dressing/Undressing What is the patient wearing?: Pull over shirt/dress Pull over shirt/dress - Perfomed by patient: Put head through opening Pull over shirt/dress - Perfomed by helper: Thread/unthread right sleeve, Thread/unthread left sleeve, Pull shirt over trunk Assist Level:   (Max A) Function - Lower Body Dressing/Undressing What is the patient wearing?: Non-skid slipper socks, Pants Position: Wheelchair/chair at sink Pants- Performed by patient: Thread/unthread right pants leg Pants- Performed by helper: Thread/unthread right pants leg, Thread/unthread left pants leg, Pull pants up/down Non-skid slipper socks- Performed by helper: Don/doff right sock, Don/doff left sock Assist for lower body dressing:  (Max A)  Function - Toileting Toileting activity did not occur: No continent bowel/bladder event Toileting steps completed by helper: Adjust clothing prior to toileting, Performs perineal hygiene, Adjust clothing after toileting Toileting Assistive Devices: Grab bar or rail Assist level: Two helpers  Function - Archivist transfer activity did not occur: Safety/medical concerns Toilet transfer assistive device: Grab bar Assist level to toilet: Touching or steadying assistance (Pt > 75%) Assist level from toilet: Touching or steadying assistance (Pt > 75%)  Function - Chair/bed transfer Chair/bed transfer method: Stand pivot Chair/bed transfer assist level: Touching or steadying assistance (Pt > 75%) Chair/bed transfer assistive device: Armrests Chair/bed transfer details: Verbal cues for precautions/safety, Verbal cues for technique  Function - Locomotion: Wheelchair Type: Manual Max wheelchair distance: 50 Assist Level: Moderate assistance (Pt 50 - 74%) Assist Level: Moderate assistance (Pt 50 - 74%) Turns around,maneuvers to table,bed, and toilet,negotiates 3% grade,maneuvers on rugs and over doorsills: No Function - Locomotion: Ambulation Assistive device: No device Max distance: 200 Assist level: Touching or steadying assistance (Pt > 75%) Assist level: Touching or steadying assistance (Pt > 75%) Assist level: Touching or steadying assistance (Pt > 75%) Assist level: Touching or steadying assistance (Pt > 75%) Assist level: Moderate  assist (Pt 50 - 74%)  Function - Comprehension Comprehension: Auditory Comprehension assist level: Understands basic 75 - 89%  of the time/ requires cueing 10 - 24% of the time  Function - Expression Expression: Verbal Expression assist level: Expresses basic 75 - 89% of the time/requires cueing 10 - 24% of the time. Needs helper to occlude trach/needs to repeat words.  Function - Social Interaction Social Interaction assist level: Interacts appropriately 75 - 89% of the time - Needs redirection for appropriate language or to initiate interaction.  Function - Problem Solving Problem solving assist level: Solves basic 50 - 74% of the time/requires cueing 25 - 49% of the time  Function - Memory Memory assist level: Recognizes or recalls 50 - 74% of the time/requires cueing 25 - 49% of the time Patient normally able to recall (first 3 days only): Current season, Location of own room, Staff names and faces, That he or she is in a hospital  Medical Problem List and Plan: 1.  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  CIR CIR 2.  DVT Prophylaxis/Anticoagulation: Pharmaceutical: Lovenox 3. Pain Management: Tylenol prn 4. Mood:  LCSW to follow for evaluation and support.  5. Neuropsych: This patient is not fully capable of making decisions on his own behalf. 6. Skin/Wound Care: routine pressure relief measures.  7. Fluids/Electrolytes/Nutrition: Monitor I/O.   BMP WNL on 12/21 8. CAD: Off ASA.  Continue metoprolol.   ?Low dose losartan for better BP control   Added statin.  9. PEA: Felt to be due to respiratory event, overdose may have been initiating event with aspiration PNA---cardiology has signed off.  10. HTN: Monitor BP bid. Titrate medications as indicated.   Cont Norvasc 10  Cont metoprolol 100 BID  Con aldactone 25  Hydralizine increased to 75 on 12/23  Controlled 12/25 11. COPD with ongoing tobacco abuse: Respiratory status  stable.  12. Dysphagia:  Continue dysphagia 2, thin liquids with full supervision for safety.  13. Leucocytosis: resolved WBC 6.0 on 12/21 14.  Hx Polysub abuse- asked neuropsych to eval  LOS (Days) 5 A FACE TO FACE EVALUATION WAS PERFORMED  Lowanda Cashaw Karis JubaAnil Annalycia Done 04/05/2016, 9:22 AM

## 2016-04-06 ENCOUNTER — Inpatient Hospital Stay (HOSPITAL_COMMUNITY): Payer: Medicare Other | Admitting: Physical Therapy

## 2016-04-06 ENCOUNTER — Inpatient Hospital Stay (HOSPITAL_COMMUNITY): Payer: Medicare Other

## 2016-04-06 ENCOUNTER — Inpatient Hospital Stay (HOSPITAL_COMMUNITY): Payer: Medicare HMO | Admitting: Occupational Therapy

## 2016-04-06 ENCOUNTER — Inpatient Hospital Stay (HOSPITAL_COMMUNITY): Payer: Medicare HMO | Admitting: Physical Therapy

## 2016-04-06 DIAGNOSIS — D62 Acute posthemorrhagic anemia: Secondary | ICD-10-CM

## 2016-04-06 LAB — BASIC METABOLIC PANEL
Anion gap: 10 (ref 5–15)
BUN: 8 mg/dL (ref 6–20)
CHLORIDE: 102 mmol/L (ref 101–111)
CO2: 25 mmol/L (ref 22–32)
Calcium: 9.9 mg/dL (ref 8.9–10.3)
Creatinine, Ser: 0.96 mg/dL (ref 0.61–1.24)
Glucose, Bld: 99 mg/dL (ref 65–99)
POTASSIUM: 3.9 mmol/L (ref 3.5–5.1)
SODIUM: 137 mmol/L (ref 135–145)

## 2016-04-06 LAB — CBC
HCT: 38.5 % — ABNORMAL LOW (ref 39.0–52.0)
HEMOGLOBIN: 12.6 g/dL — AB (ref 13.0–17.0)
MCH: 27.3 pg (ref 26.0–34.0)
MCHC: 32.7 g/dL (ref 30.0–36.0)
MCV: 83.3 fL (ref 78.0–100.0)
PLATELETS: 385 10*3/uL (ref 150–400)
RBC: 4.62 MIL/uL (ref 4.22–5.81)
RDW: 14.2 % (ref 11.5–15.5)
WBC: 4.4 10*3/uL (ref 4.0–10.5)

## 2016-04-06 MED ORDER — NICOTINE 14 MG/24HR TD PT24
14.0000 mg | MEDICATED_PATCH | Freq: Every day | TRANSDERMAL | Status: DC
  Administered 2016-04-06 – 2016-04-14 (×9): 14 mg via TRANSDERMAL
  Filled 2016-04-06 (×9): qty 1

## 2016-04-06 NOTE — Progress Notes (Signed)
Physical Therapy Session Note  Patient Details  Name: Randall Patel MRN: [id number] Date of Birth: [birth year]  Today's Date: 04/06/2016 PT Individual Time: 1450-1530 PT Individual Time Calculation (min): 40 min    Short Term Goals: Week 1:  PT Short Term Goal 1 (Week 1): Pt will perform functional transfers with min A PT Short Term Goal 2 (Week 1): pt will demo emergent awareness with mod A PT Short Term Goal 3 (Week 1): pt will demo dynamic sitting balance with supervision  Skilled Therapeutic Interventions/Progress Updates:    Pt received in bed & agreeable to tx, denying c/o pain. Session focused on ambulation throughout unit, stair negotiation, car transfers & forced use of LUE for neuro re-ed. Pt ambulated without AD with supervision overall with occasional lateral sway. Pt negotiated car transfer from jeep simulated height with supervision. Pt negotiated 12 steps (6") with B rails progressing to 1 rail with LUE only for use of LUE; pt demonstrates decreased balance with descending stairs as compared to ascending them. Pt had to return to room x 1 time to use yaunker as pt unable to manage secretions; pt required assist for proper use of device. Pt performed hand hygiene standing at sink with max cuing to wash & attend to L hand. Utilized dynavision in sitting with LUE for forced use and LUE neuro re-ed. Pt with poor coordination & control of extremity. Attempted to play checkers with pt but pt with difficulty using LUE and stating "I can't see". Pt became tearful during task, reporting frustration over state of LUE. Pt returned to room and therapist provided emotional support/encouragement. Pt left in handoff to SLP.  Therapy Documentation Precautions:  Precautions Precautions: Fall Precaution Comments: motor planning deficits Restrictions Weight Bearing Restrictions: No   See Function Navigator for Current Functional Status.   Therapy/Group: Individual Therapy  Sandi Mariscal 04/06/2016, 5:22 PM

## 2016-04-06 NOTE — Progress Notes (Signed)
Physical Therapy Session Note  Patient Details  Name: Randall Patel MRN: [id number] Date of Birth: May 25, 1950  Today's Date: 04/06/2016 PT Individual Time: 1300-1343 PT Individual Time Calculation (min): 43 min    Short Term Goals: Week 1:  PT Short Term Goal 1 (Week 1): Pt will perform functional transfers with min A PT Short Term Goal 2 (Week 1): pt will demo emergent awareness with mod A PT Short Term Goal 3 (Week 1): pt will demo dynamic sitting balance with supervision  Skilled Therapeutic Interventions/Progress Updates:    Pt resting in w/c on arrival with no c/o pain but reporting urgent need to use restroom.  Ambulatory transfer into bathroom with supervision, pt sat for up to 10 minutes without success.  Reports urgency with little relief and burning when attempting to urinate.  RN alerted.  Pt agreeable to mobility with session focus on endurance, balance, visual scanning, transfers, and ambulation.  Pt currently performing all transfers with close supervision.  Ambulation to ortho gym with close supervision and intermittent steady assist for obstacle negotiation on L side, min verbal cues for visual scanning to L visual field for improved safety with minimal carryover noted.  Nustep x500 steps with slow rate focus on pacing, strengthening, and attention.  Pt returned to room at end of session and requesting to attempt toileting again, ambulates into bathroom supervision and educated to pull cord for assistance before standing up.  Pt agreeable.    Therapy Documentation Precautions:  Precautions Precautions: Fall Precaution Comments: motor planning deficits Restrictions Weight Bearing Restrictions: No   See Function Navigator for Current Functional Status.   Therapy/Group: Individual Therapy  Ladora Daniel Penven-Crew 04/06/2016, 1:45 PM

## 2016-04-06 NOTE — Progress Notes (Addendum)
Subjective/Complaints: Pt laying in bed this AM.  He is again sleepy and offers little interaction.  He nods that he slept well overnight.   ROS: Denies CP, SOB, N/V/D  Objective: Vital Signs: Blood pressure 132/90, pulse 64, temperature 98.3 F (36.8 C), temperature source Oral, resp. rate 18, height _0  (1.651 m), weight 61.5 kg (135 lb 8 oz), SpO2 99 %. No results found. Results for orders placed or performed during the hospital encounter of 03/31/16 (from the past 72 hour(s))  CBC     Status: Abnormal   Collection Time: 04/06/16  5:21 AM  Result Value Ref Range   WBC 4.4 4.0 - 10.5 K/uL   RBC 4.62 4.22 - 5.81 MIL/uL   Hemoglobin 12.6 (L) 13.0 - 17.0 g/dL   HCT 38.5 (L) 39.0 - 52.0 %   MCV 83.3 78.0 - 100.0 fL   MCH 27.3 26.0 - 34.0 pg   MCHC 32.7 30.0 - 36.0 g/dL   RDW 14.2 11.5 - 15.5 %   Platelets 385 150 - 400 K/uL  Basic metabolic panel     Status: None   Collection Time: 04/06/16  5:21 AM  Result Value Ref Range   Sodium 137 135 - 145 mmol/L   Potassium 3.9 3.5 - 5.1 mmol/L   Chloride 102 101 - 111 mmol/L   CO2 25 22 - 32 mmol/L   Glucose, Bld 99 65 - 99 mg/dL   BUN 8 6 - 20 mg/dL   Creatinine, Ser 0.96 0.61 - 1.24 mg/dL   Calcium 9.9 8.9 - 10.3 mg/dL   GFR calc non Af Amer >60 >60 mL/min   GFR calc Af Amer >60 >60 mL/min    Comment: (NOTE) The eGFR has been calculated using the CKD EPI equation. This calculation has not been validated in all clinical situations. eGFR's persistently <60 mL/min signify possible Chronic Kidney Disease.    Anion gap 10 5 - 15      General: NAD. Vital signs reviewed.  Psych: Normal affect and Mood  Heart: RRR. No JVD. Lungs: Clear to auscultation, breathing unlabored Abdomen: Positive bowel sounds, soft  Skin: No evidence of breakdown, no evidence of rash Neurologic:  Motor: 4/5 in R deltoid, bicep, tricep, grip, Bilateral hip flexor, knee extensors, ankle dorsiflexor and plantar flexor (?Participation) 3+/5 in left delt  biceps triceps grip (?participation) Alert and oriented Musculoskeletal: No edema. No tenderness.   Assessment/Plan: 1. 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 Physiatrist is providing close team supervision and 24 hour management of active medical problems listed below. Physiatrist and rehab team continue to assess barriers to discharge/monitor patient progress toward functional and medical goals. FIM: Function - Bathing Position: Shower Body parts bathed by patient: Left arm, Chest, Abdomen, Front perineal area, Buttocks, Right upper leg, Left upper leg Body parts bathed by helper: Right arm, Right lower leg, Left lower leg, Back Assist Level:  (Mod A)  Function- Upper Body Dressing/Undressing What is the patient wearing?: Pull over shirt/dress Pull over shirt/dress - Perfomed by patient: Put head through opening Pull over shirt/dress - Perfomed by helper: Thread/unthread right sleeve, Thread/unthread left sleeve, Pull shirt over trunk Assist Level:  (Max A) Function - Lower Body Dressing/Undressing What is the patient wearing?: Non-skid slipper socks, Pants Position: Wheelchair/chair at sink Pants- Performed by patient: Thread/unthread right pants leg Pants- Performed by helper: Thread/unthread right pants leg, Thread/unthread  left pants leg, Pull pants up/down Non-skid slipper socks- Performed by helper: Don/doff right sock, Don/doff left sock Assist for lower body dressing:  (Max A)  Function - Toileting Toileting activity did not occur: No continent bowel/bladder event Toileting steps completed by helper: Adjust clothing prior to toileting, Performs perineal hygiene, Adjust clothing after toileting Toileting Assistive Devices: Grab bar or rail Assist level: Two helpers  Function - Air cabin crew transfer activity did not occur:  Safety/medical concerns Toilet transfer assistive device: Grab bar Assist level to toilet: Touching or steadying assistance (Pt > 75%) Assist level from toilet: Touching or steadying assistance (Pt > 75%)  Function - Chair/bed transfer Chair/bed transfer method: Stand pivot Chair/bed transfer assist level: Touching or steadying assistance (Pt > 75%) Chair/bed transfer assistive device: Armrests Chair/bed transfer details: Verbal cues for precautions/safety, Verbal cues for technique  Function - Locomotion: Wheelchair Type: Manual Max wheelchair distance: 50 Assist Level: Moderate assistance (Pt 50 - 74%) Assist Level: Moderate assistance (Pt 50 - 74%) Turns around,maneuvers to table,bed, and toilet,negotiates 3% grade,maneuvers on rugs and over doorsills: No Function - Locomotion: Ambulation Assistive device: No device Max distance: 200 Assist level: Touching or steadying assistance (Pt > 75%) Assist level: Touching or steadying assistance (Pt > 75%) Assist level: Touching or steadying assistance (Pt > 75%) Assist level: Touching or steadying assistance (Pt > 75%) Assist level: Moderate assist (Pt 50 - 74%)  Function - Comprehension Comprehension: Auditory Comprehension assist level: Understands basic 75 - 89% of the time/ requires cueing 10 - 24% of the time  Function - Expression Expression: Verbal Expression assist level: Expresses basic 75 - 89% of the time/requires cueing 10 - 24% of the time. Needs helper to occlude trach/needs to repeat words.  Function - Social Interaction Social Interaction assist level: Interacts appropriately 75 - 89% of the time - Needs redirection for appropriate language or to initiate interaction.  Function - Problem Solving Problem solving assist level: Solves basic 50 - 74% of the time/requires cueing 25 - 49% of the time  Function - Memory Memory assist level: Recognizes or recalls 50 - 74% of the time/requires cueing 25 - 49% of the  time Patient normally able to recall (first 3 days only): Current season, Location of own room, Staff names and faces, That he or she is in a hospital  Medical Problem List and Plan: 1.  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  CIR CIR 2.  DVT Prophylaxis/Anticoagulation: Pharmaceutical: Lovenox 3. Pain Management: Tylenol prn 4. Mood:  LCSW to follow for evaluation and support.  5. Neuropsych: This patient is not fully capable of making decisions on his own behalf. 6. Skin/Wound Care: routine pressure relief measures.  7. Fluids/Electrolytes/Nutrition: Monitor I/O.   BMP WNL on 12/26 8. CAD: Off ASA, will inquire about resuming.  Continue metoprolol.   ?Low dose losartan for better BP control, will inquire  Added statin.  9. PEA: Felt to be due to respiratory event, overdose may have been initiating event with aspiration PNA---cardiology has signed off.  10. HTN: Monitor BP bid. Titrate medications as indicated.   Cont Norvasc 10  Cont metoprolol 100 BID  Con aldactone 25  Hydralizine increased to 75 on 12/23  Controlled 12/26 11. COPD with ongoing tobacco abuse: Respiratory status stable.  12. Dysphagia:  Continue dysphagia 2, thin liquids with full supervision for safety.  13. Leucocytosis: resolved WBC 6.0 on 12/21 14.  Hx Polysub abuse- asked neuropsych  to eval 15. ABLA  Hb 12.6 on 12/26  Cont to monitor  LOS (Days) 6 A FACE TO FACE EVALUATION WAS PERFORMED  Ankit Lorie Phenix 04/06/2016, 9:14 AM

## 2016-04-06 NOTE — Progress Notes (Signed)
Occupational Therapy Note  Patient Details  Name: Randall Patel MRN: [id number] Date of Birth: 1950/10/21  Today's Date: 04/06/2016 OT Individual Time: 1345-1430 OT Individual Time Calculation (min): 45 min    Pt denied pain Individual Therapy  Pt resting in bed upon arrival and stated he had just returned from therapy.  Pt c/o burning sensation when he attempted to void but was unable to void.  RN aware.  Pt had not eaten lunch because "my left arm goes crazy." Therapist educated pt on role of OT and that we could work on self feeding.  Pt inisistent that he did not want to eat at this time.  Pt agreed to sitting EOB and engaged in table activities with focus on BUE use for simple functional tasks.  Pt c/o being cold and requested to lay back into bed and to have the heat turned up.  Pt c/o ongoing blurred vision but denied double vision. Pt remained in bed with all needs within reach and bed alarm activated.   Lavone Neri [facility] 04/06/2016, 2:53 PM

## 2016-04-06 NOTE — Progress Notes (Signed)
Occupational Therapy Session Note  Patient Details  Name: Randall Patel MRN: [id number] Date of Birth: May 08, 1950  Today's Date: 04/06/2016 OT Individual Time: 1105-1205 OT Individual Time Calculation (min): 60 min     Short Term Goals: Week 1:  OT Short Term Goal 1 (Week 1): Pt will complete UB bathing with min assist sitting unsupported.  OT Short Term Goal 2 (Week 1): Pt will complete LB bathing with min assist sit to stand.  OT Short Term Goal 3 (Week 1): Pt will donn pullover shirt with min assist.  OT Short Term Goal 4 (Week 1): Pt will complete LB dressing with mod assist sit to stand.  Skilled Therapeutic Interventions/Progress Updates:    Pt seen for ADL retraining with a focus on LUE coordination, motor planning, visual adaptations, balance. Pt is able to ambulate in room to shower with only HHA and is able to use brightly colored visual cues placed by previous therapist to locate items. He has a great deal of difficulty with self feeding, donning socks due to impaired vision and impaired LUE AROM/coordination. His constant arm tremors bother and he is concerned about them.  Pt did well with standing balance, but needed hand over hand A at times to guide his R arm to begin tasks.  When he visually focuses on L arm he is able to actively lift it to 120 degrees. Pt did well with tactile cues.  Pt resting in w/c with all needs met. Quick release belt on.  Therapy Documentation Precautions:  Precautions Precautions: Fall Precaution Comments: motor planning deficits Restrictions Weight Bearing Restrictions: No    Vital Signs: Therapy Vitals Pulse Rate: (!) 56 BP: (!) 142/82 Pain: Pain Assessment Pain Assessment: No/denies pain ADL:  See Function Navigator for Current Functional Status.   Therapy/Group: Individual Therapy  SAGUIER,JULIA 04/06/2016, 12:39 PM

## 2016-04-06 NOTE — Evaluation (Signed)
Speech Language Pathology Assessment and Plan  Patient Details  Name: Randall Patel MRN: [id number] Date of Birth: 25-Feb-1951  SLP Diagnosis: Dysphagia  Rehab Potential: Fair ELOS: ~7 days     Today's Date: 04/06/2016 SLP Individual Time: 6160-7371 SLP Individual Time Calculation (min): 27 min    Problem List:  Patient Active Problem List   Diagnosis Date Noted  . Anoxic brain injury ([place]) 03/31/2016  . Pulseless electrical activity ([place])   . Chronic obstructive pulmonary disease ([place])   . Dysphagia, post-stroke   . Abnormal CT of brain   . Coronary artery disease involving native coronary artery of native heart without angina pectoris   . ETOH abuse   . Tachypnea   . Benign essential HTN   . Hyperglycemia   . Pain   . Leukocytosis   . Acute pulmonary edema (H[facility])   . Respiratory failure ([place])   . Sinus tachycardia   . Cardiac arrest ([place]) 03/23/2016  . Palpitations 08/15/2015  . History of stroke 08/15/2015  . Anterior cerebral circulation hemorrhagic infarction ([place]) 08/15/2015  . HLD (hyperlipidemia) 08/15/2015  . Muscle stiffness   . Cough   . Hemiparesis affecting left side as late effect of stroke ([place])   . Dysphagia as late effect of cerebrovascular disease   . Essential hypertension   . Acute blood loss anemia   . 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 ([place]) 04/10/2015  . Dysphagia   . Dysarthria due to cerebrovascular accident ([place])   . Diastolic dysfunction   . Tobacco abuse   . ETOH abuse   . History of CVA with residual deficit   . Coronary artery disease involving native coronary artery of native heart with angina pectoris ([place])   . Tachypnea   . Hypernatremia   . Hypokalemia   . Thrombocytopenia ([place])   . Intracranial hemorrhage ([place])   . Essential hypertension, malignant   . ICH (intracerebral hemorrhage) ([place]) 04/04/2015  . STEMI (ST elevation myocardial infarction) ([place]) 04/14/2012  . NSTEMI (non-ST elevated myocardial  infarction) ([place]) 04/14/2012  . Cardiomyopathy, ischemic 04/14/2012  . Cerebral embolism with cerebral infarction ([place]) 04/03/2012  . Unspecified transient cerebral ischemia 04/02/2012  . Hemiplegia, unspecified, affecting nondominant side 04/02/2012  . CAD (coronary artery disease) s/p stent 2008 in [place] 04/02/2012  . Hypertension    Past Medical History:  Past Medical History:  Diagnosis Date  . CAD (coronary artery disease)    a. prior MI in 2006 b. 03/2012: 30% ostial LM stenosis, 30% pLAD stenosis, 60% mid-LADm 30% pCx, 3rd OM which was small in size with diffuse 90% stenosis involving both branches, and RCA with 50% mid-stenosis and 100% distal occlusion. POBA to PL branch and DES placement to dRCA.  . Claudication ([place])   . Coronary artery disease    MI in 2008 with PCI to ? vessel; NSTEMI 03/2012; NSTEMI 04/2012 Cath showed triple vessel dz including occluded RCA, s/p DES to distal RCA, PTCA posterolateral branch   . CVA (cerebral infarction)    03/2012 with left arm weakness/numbness and left facial droop.  [name] ETOH abuse    quit 04/06/12  . ETOH abuse   . History of stroke 03/2015   right frontal hemorrhage  . HTN (hypertension)   . Hyperlipidemia   . Hypertension   . Ischemic cardiomyopathy    04/2012 EF 35-40%  . Ischemic cardiomyopathy    a. EF 40-45% by echo in 03/2015  . Seizures ([place])   . Stroke ([place])   .  Tobacco abuse    quit 04/06/12  . Tobacco abuse   . Withdrawal seizures [facility])    Past Surgical History:  Past Surgical History:  Procedure Laterality Date  . CORONARY ANGIOPLASTY WITH STENT PLACEMENT  03/2012  . HERNIA REPAIR    . LEFT HEART CATHETERIZATION WITH CORONARY ANGIOGRAM N/A 04/11/2012   Procedure: LEFT HEART CATHETERIZATION WITH CORONARY ANGIOGRAM;  Surgeon: Burnell Blanks, MD;  Location: [facility] CATH LAB;  Service: Cardiovascular;  Laterality: N/A;  . PERCUTANEOUS CORONARY INTERVENTION-BALLOON ONLY  04/11/2012   Procedure: PERCUTANEOUS CORONARY  INTERVENTION-BALLOON ONLY;  Surgeon: Burnell Blanks, MD;  Location: [facility] CATH LAB;  Service: Cardiovascular;;  RPLA  . PERCUTANEOUS CORONARY STENT INTERVENTION (PCI-S)  04/11/2012   Procedure: PERCUTANEOUS CORONARY STENT INTERVENTION (PCI-S);  Surgeon: Burnell Blanks, MD;  Location: [facility] CATH LAB;  Service: Cardiovascular;;  Distal RCA    Assessment / Plan / Recommendation Clinical Impression   Randall Patel is a 65 y.o. 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 EMS found patient unresponsive on the ground with blood sputum but with pulse. He went cardiac arrest--PEA treated with CPR x 6 minutes and epinephrine with ROSC. He was acidotic at admission and intubated and started on IV antibiotics. UDS positive for benzos and THC. MRI brain revealed subacute hematoma right frontal lobe--31 mm. Cardiology consulted and elevated cardiac enzymes felt to be due to demand ischemia in setting of cardiac arrest. 2 D echo with EF 45-50% with hypokinesis of inferior and inferoseptal myocardium and moderately increased pulmonary pressures. Cath precluded due to hematoma and cardiology recommended medical management. EEG showed suggestion of global anoxic injury and Dr. Leonie Man felt that patient with asymptomatic ICH with incidental old finding from prior right posterior frontal hematoma with old right anterior frontal infarct from 03/2015. Encephalopathy felt to be due to ABI and agitation has resolved. Acute pulmonary edema treated with diuresis and . elevated HTN was managed with titration to oral meds. 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 CIR was recommended for follow up therapy.  Pt was initially evaluated by SLP upon admission to CIR and discharged due to pt being at baseline  for speech,language, cognition, and swallowing.  Pt with slight decline in function over the weekend and is now requiring his meds crushed in puree with reports from nursing of increased oral holding and pocketing of solids.  As a result, repeat SLP consult was ordered.  Pt continues to present with s/s of a mild oropharyngeal dysphagia consistent with level of function known from previous CIR admission. Pt with left sided oral motor weakness impacting containment and transit of boluses which results in anterior labial spillage and pocketing of boluses.  Pt with x2 episodes of coughing with thin liquids via straw which SLP suspects to be related to decreased bolus cohesion which was evident on previous MBS ~1 year ago.  Despite no significant changes in clinical presentation from previous admission to CIR, pt does report that he was tolerating a regular diet prior to this acute admission.  As a result, pt would benefit from brief ST follow up while inpatient in order to safely manage diet progression.  No formal cognitive evaluation completed on this date given that pt overall appears to be at baseline known from previous CIR admission and has recommended level of  assist at home (pt reports he lives with his sister who helps him manage his household responsibilities).    Skilled Therapeutic Interventions          Bedside swallow evaluation completed with results and recommendations reviewed with patient.  Overall pt needed min assist-supervision verbal cues for use of swallowing precautions during presentations of his currently prescribed diet and trials of advanced textures.       SLP Assessment  Patient will need skilled Speech Lanaguage Pathology Services during CIR admission    Recommendations  SLP Diet Recommendations: Dysphagia 2 (Fine chop);Thin Liquid Administration via: Straw Medication Administration: Crushed with puree Supervision: Full supervision/cueing for compensatory  strategies Compensations: Minimize environmental distractions;Slow rate;Small sips/bites;Lingual sweep for clearance of pocketing;Monitor for anterior loss;Follow solids with liquid Postural Changes and/or Swallow Maneuvers: Seated upright 90 degrees;Upright 30-60 min after meal Oral Care Recommendations: Oral care BID Recommendations for Other Services: Neuropsych consult Patient destination: Home Follow up Recommendations: None Equipment Recommended: None recommended by SLP    SLP Frequency 1 to 3 out of 7 days   SLP Duration  SLP Intensity  SLP Treatment/Interventions ~7 days   Minumum of 1-2 x/day, 30 to 90 minutes  Cueing hierarchy;Dysphagia/aspiration precaution training;Environmental controls;Internal/external aids;Patient/family education    Pain Pain Assessment Pain Assessment: No/denies pain  Prior Functioning Cognitive/Linguistic Baseline: Baseline deficits Type of Home: House  Lives With: Family Available Help at Discharge: Family;Available 24 hours/day Vocation: On disability  Function:  Eating Eating   Modified Consistency Diet: Yes Eating Assist Level: Supervision or verbal cues           Cognition Comprehension Comprehension assist level: Understands basic 90% of the time/cues < 10% of the time  Expression   Expression assist level: Expresses basic 90% of the time/requires cueing < 10% of the time.  Social Interaction Social Interaction assist level: Interacts appropriately 75 - 89% of the time - Needs redirection for appropriate language or to initiate interaction.  Problem Solving Problem solving assist level: Solves basic 50 - 74% of the time/requires cueing 25 - 49% of the time  Memory Memory assist level: Recognizes or recalls 50 - 74% of the time/requires cueing 25 - 49% of the time   Short Term Goals: Week 1: SLP Short Term Goal 1 (Week 1): STG=LTG due to ELOS   Refer to Care Plan for Long Term Goals  Recommendations for other services:  Neuropsych  Discharge Criteria: Patient will be discharged from SLP if patient refuses treatment 3 consecutive times without medical reason, if treatment goals not met, if there is a change in medical status, if patient makes no progress towards goals or if patient is discharged from hospital.  The above assessment, treatment plan, treatment alternatives and goals were discussed and mutually agreed upon: by patient  Emilio Math 04/06/2016, 4:05 PM

## 2016-04-07 ENCOUNTER — Inpatient Hospital Stay (HOSPITAL_COMMUNITY): Payer: Medicare HMO

## 2016-04-07 ENCOUNTER — Inpatient Hospital Stay (HOSPITAL_COMMUNITY): Payer: Medicare HMO | Admitting: Occupational Therapy

## 2016-04-07 ENCOUNTER — Inpatient Hospital Stay (HOSPITAL_COMMUNITY): Payer: Medicare Other | Admitting: Physical Therapy

## 2016-04-07 DIAGNOSIS — G931 Anoxic brain damage, not elsewhere classified: Secondary | ICD-10-CM

## 2016-04-07 LAB — URINALYSIS, ROUTINE W REFLEX MICROSCOPIC
Bilirubin Urine: NEGATIVE
GLUCOSE, UA: NEGATIVE mg/dL
HGB URINE DIPSTICK: NEGATIVE
Ketones, ur: NEGATIVE mg/dL
Leukocytes, UA: NEGATIVE
Nitrite: NEGATIVE
PH: 5 (ref 5.0–8.0)
Protein, ur: NEGATIVE mg/dL
SPECIFIC GRAVITY, URINE: 1.019 (ref 1.005–1.030)

## 2016-04-07 NOTE — Progress Notes (Signed)
Subjective/Complaints: Pt up in bed. No new complaints.   ROS: pt denies nausea, vomiting, diarrhea, cough, shortness of breath or chest pain    Objective: Vital Signs: Blood pressure (!) 141/83, pulse 62, temperature 98.1 F (36.7 C), temperature source Oral, resp. rate 18, height _0  (1.651 m), weight 60.5 kg (133 lb 6.4 oz), SpO2 100 %. No results found. Results for orders placed or performed during the hospital encounter of 03/31/16 (from the past 72 hour(s))  CBC     Status: Abnormal   Collection Time: 04/06/16  5:21 AM  Result Value Ref Range   WBC 4.4 4.0 - 10.5 K/uL   RBC 4.62 4.22 - 5.81 MIL/uL   Hemoglobin 12.6 (L) 13.0 - 17.0 g/dL   HCT 38.5 (L) 39.0 - 52.0 %   MCV 83.3 78.0 - 100.0 fL   MCH 27.3 26.0 - 34.0 pg   MCHC 32.7 30.0 - 36.0 g/dL   RDW 14.2 11.5 - 15.5 %   Platelets 385 150 - 400 K/uL  Basic metabolic panel     Status: None   Collection Time: 04/06/16  5:21 AM  Result Value Ref Range   Sodium 137 135 - 145 mmol/L   Potassium 3.9 3.5 - 5.1 mmol/L   Chloride 102 101 - 111 mmol/L   CO2 25 22 - 32 mmol/L   Glucose, Bld 99 65 - 99 mg/dL   BUN 8 6 - 20 mg/dL   Creatinine, Ser 0.96 0.61 - 1.24 mg/dL   Calcium 9.9 8.9 - 10.3 mg/dL   GFR calc non Af Amer >60 >60 mL/min   GFR calc Af Amer >60 >60 mL/min    Comment: (NOTE) The eGFR has been calculated using the CKD EPI equation. This calculation has not been validated in all clinical situations. eGFR's persistently <60 mL/min signify possible Chronic Kidney Disease.    Anion gap 10 5 - 15  Urinalysis, Routine w reflex microscopic     Status: None   Collection Time: 04/06/16 11:51 PM  Result Value Ref Range   Color, Urine YELLOW YELLOW   APPearance CLEAR CLEAR   Specific Gravity, Urine 1.019 1.005 - 1.030   pH 5.0 5.0 - 8.0   Glucose, UA NEGATIVE NEGATIVE mg/dL   Hgb urine dipstick NEGATIVE NEGATIVE   Bilirubin Urine NEGATIVE NEGATIVE   Ketones, ur NEGATIVE NEGATIVE mg/dL   Protein, ur NEGATIVE  NEGATIVE mg/dL   Nitrite NEGATIVE NEGATIVE   Leukocytes, UA NEGATIVE NEGATIVE      General: NAD.  Psych: Normal affect and Mood  Heart: RRR wiothout murmur Lungs: clear bilaterally. No distress Abdomen: Positive bowel sounds, soft  Skin: No evidence of breakdown, no evidence of rash Neurologic:  Motor: 4/5 in R deltoid, bicep, tricep, grip, Bilateral hip flexor, knee extensors, ankle dorsiflexor and plantar flexor.  3+/5 in left delt biceps triceps grip grossly. Alert and oriented Musculoskeletal: No edema. No tenderness.   Assessment/Plan: 1. 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 Physiatrist is providing close team supervision and 24 hour management of active medical problems listed below. Physiatrist and rehab team continue to assess barriers to discharge/monitor patient progress toward functional and medical goals. FIM: Function - Bathing Position: Shower Body parts bathed by patient: Left arm, Chest, Abdomen, Front perineal area, Buttocks, Right upper leg, Left upper leg, Right arm, Right lower leg, Left lower leg Body parts bathed by helper:  Back Assist Level: Touching or steadying assistance(Pt > 75%)  Function- Upper Body Dressing/Undressing What is the patient wearing?: Pull over shirt/dress Pull over shirt/dress - Perfomed by patient: Put head through opening, Thread/unthread right sleeve Pull over shirt/dress - Perfomed by helper: Thread/unthread right sleeve, Thread/unthread left sleeve, Put head through opening, Pull shirt over trunk Assist Level:  (Max A) Function - Lower Body Dressing/Undressing What is the patient wearing?: Non-skid slipper socks, Pants Position: Wheelchair/chair at sink Pants- Performed by patient: Thread/unthread right pants leg, Thread/unthread left pants leg, Pull pants up/down Pants- Performed by helper:  Thread/unthread right pants leg Non-skid slipper socks- Performed by patient: Don/doff right sock Non-skid slipper socks- Performed by helper: Don/doff left sock Assist for lower body dressing: Touching or steadying assistance (Pt > 75%)  Function - Toileting Toileting activity did not occur: No continent bowel/bladder event Toileting steps completed by patient: Adjust clothing prior to toileting, Performs perineal hygiene, Adjust clothing after toileting Toileting steps completed by helper: Adjust clothing prior to toileting, Adjust clothing after toileting Toileting Assistive Devices: Grab bar or rail Assist level: Touching or steadying assistance (Pt.75%)  Function - Air cabin crew transfer activity did not occur: Safety/medical concerns Toilet transfer assistive device: Grab bar Assist level to toilet: Touching or steadying assistance (Pt > 75%) Assist level from toilet: Touching or steadying assistance (Pt > 75%)  Function - Chair/bed transfer Chair/bed transfer method: Ambulatory Chair/bed transfer assist level: Touching or steadying assistance (Pt > 75%) Chair/bed transfer assistive device: Armrests Chair/bed transfer details: Verbal cues for precautions/safety, Verbal cues for technique  Function - Locomotion: Wheelchair Type: Manual Max wheelchair distance: 50 Assist Level: Moderate assistance (Pt 50 - 74%) Assist Level: Moderate assistance (Pt 50 - 74%) Turns around,maneuvers to table,bed, and toilet,negotiates 3% grade,maneuvers on rugs and over doorsills: No Function - Locomotion: Ambulation Assistive device: No device Max distance: 200 Assist level: Touching or steadying assistance (Pt > 75%) Assist level: Touching or steadying assistance (Pt > 75%) Assist level: Touching or steadying assistance (Pt > 75%) Assist level: Touching or steadying assistance (Pt > 75%) Assist level: Moderate assist (Pt 50 - 74%)  Function - Comprehension Comprehension:  Auditory Comprehension assist level: Understands basic 90% of the time/cues < 10% of the time  Function - Expression Expression: Verbal Expression assist level: Expresses basic 90% of the time/requires cueing < 10% of the time.  Function - Social Interaction Social Interaction assist level: Interacts appropriately 75 - 89% of the time - Needs redirection for appropriate language or to initiate interaction.  Function - Problem Solving Problem solving assist level: Solves basic 50 - 74% of the time/requires cueing 25 - 49% of the time  Function - Memory Memory assist level: Recognizes or recalls less than 25% of the time/requires cueing greater than 75% of the time Patient normally able to recall (first 3 days only): Current season, Location of own room, Staff names and faces, That he or she is in a hospital  Medical Problem List and Plan: 1.  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  Continue CIR therapies 2.  DVT Prophylaxis/Anticoagulation: Pharmaceutical: Lovenox 3. Pain Management: Tylenol prn 4. Mood:  LCSW to follow for evaluation and support.  5. Neuropsych: This patient is not fully capable of making decisions on his own behalf. 6. Skin/Wound Care: routine pressure relief measures.  7. Fluids/Electrolytes/Nutrition: Monitor I/O.   BMP WNL on 12/26  -encourage PO 8. CAD: Off ASA, will inquire about resuming.  Continue metoprolol.   BP within reasonable range at present  continue statin.  9. PEA: Felt to be due to respiratory event, overdose may have been initiating event with aspiration PNA---cardiology has signed off.  10. HTN: Monitor BP bid. Titrate medications as indicated.   Cont Norvasc 10  Cont metoprolol 100 BID  Con aldactone 25  Hydralizine increased to 75 on 12/23  Controlled at present 11. COPD with ongoing tobacco abuse: Respiratory status stable.  12. Dysphagia:  Continue dysphagia 2, thin liquids with full  supervision for safety.  13. Leucocytosis: resolved WBC 6.0 on 12/21 14.  Hx Polysub abuse- asked neuropsych to eval 15. ABLA  Hb 12.6 on 12/26  Cont to monitor  LOS (Days) 7 A FACE TO FACE EVALUATION WAS PERFORMED  SWARTZ,ZACHARY T 04/07/2016, 6:40 PM

## 2016-04-07 NOTE — Progress Notes (Addendum)
Social Work Patient ID: Randall Patel, male   DOB: 09-30-1950, 65 y.o.   MRN: [id number]  Met with pt and spoke with sister via telephone to discuss team conference goals supervision level and discharge date 1/3. Sister reports she can provide 24 hr supervision to him she has in the past and will again. Pt wanted to leave sooner but is ok with this plan. Asked sister to come in and attend therapies with pt ans he reports: " I know what To do for him I have done it before."  Will work on discharge needs, wants to have home health like had before. See is neuro-psych is here next week and make referral for pt to be seen.

## 2016-04-07 NOTE — Progress Notes (Addendum)
Physical Therapy Note  Patient Details  Name: Randall Patel MRN: [id number] Date of Birth: May 23, 1950 Today's Date: 04/07/2016  [id number], 90 min individual tx Pain: none  MD in room with pt; pt told him he needed to use toilet urgently to void.  Pt did wait until PT entered room to take him toilet.  Bed mobility with supervision.  Sit> stand with close supervision and gait without AD into BR with min assist.  Pt voided small amount of urine; Jasmine, Nt informed.  Tremors noted trunk, BUE; spasticity LUE.  Neuromuscular re-education via forced use, L hand splint for alternating reciprocal movement x 4 extremities x 10 min at level 4; standing calf raises, heel raisesm, mini squats x 10 each. . Gait on level tile without AD; 12 stairs 2 rails with min assist to progress L hand.  Therapeutic activities to address vision and motor planning when reaching out of BOS in unsupported sitting and standing, biased to L; sit>< stand x 10 without use of UEs focusing on eccentric control .  Pelvic dissociation in unsupported sitting for L hip activation and core strengthening.  Gait to return to room, route finding with mod assist for room numbers.  Pt left resting in w/c with quick release belt applied and all needs within reach.  See function navigator for current status      Leverne Amrhein 04/07/2016, 7:45 AM

## 2016-04-07 NOTE — Patient Care Conference (Signed)
Inpatient RehabilitationTeam Conference and Plan of Care Update Date: 04/07/2016   Time: 10:45 AM    Patient Name: Randall Patel      Medical Record Number: [id number]  Date of Birth: June 04, 1950 Sex: Male         Room/Bed: 4W13C/4W13C-01 Payor Info: Payor: [employer] MEDICARE / Plan: [employer] MEDICARE HMO / Product Type: *No Product type* /    Admitting Diagnosis: Amoxic BI  Admit Date/Time:  03/31/2016  4:57 PM Admission Comments: No comment available   Primary Diagnosis:  <principal problem not specified> Principal Problem: <principal problem not specified>  Patient Active Problem List   Diagnosis Date Noted  . Anoxic brain injury (HCC) 03/31/2016  . Pulseless electrical activity (HCC)   . Chronic obstructive pulmonary disease (HCC)   . Dysphagia, post-stroke   . Abnormal CT of brain   . Coronary artery disease involving native coronary artery of native heart without angina pectoris   . ETOH abuse   . Tachypnea   . Benign essential HTN   . Hyperglycemia   . Pain   . Leukocytosis   . Acute pulmonary edema (HCC)   . Respiratory failure (HCC)   . Sinus tachycardia   . Cardiac arrest (HCC) 03/23/2016  . Palpitations 08/15/2015  . History of stroke 08/15/2015  . Anterior cerebral circulation hemorrhagic infarction (HCC) 08/15/2015  . HLD (hyperlipidemia) 08/15/2015  . Muscle stiffness   . Cough   . Hemiparesis affecting left side as late effect of stroke (HCC)   . Dysphagia as late effect of cerebrovascular disease   . Essential hypertension   . Acute blood loss anemia   . 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 Dysphagia   . 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 Diastolic dysfunction   . Tobacco abuse   . ETOH abuse   . History of CVA with residual deficit   . Coronary artery disease involving native coronary artery of native heart with angina pectoris (HCC)   . Tachypnea   . Hypernatremia   . Hypokalemia   .  Thrombocytopenia (HCC)   . Intracranial hemorrhage (HCC)   . Essential hypertension, malignant   . ICH (intracerebral hemorrhage) (HCC) 04/04/2015  . STEMI (ST elevation myocardial infarction) (HCC) 04/14/2012  . NSTEMI (non-ST elevated myocardial infarction) (HCC) 04/14/2012  . Cardiomyopathy, ischemic 04/14/2012  . Cerebral embolism with cerebral infarction (HCC) 04/03/2012  . Unspecified transient cerebral ischemia 04/02/2012  . Hemiplegia, unspecified, affecting nondominant side 04/02/2012  . CAD (coronary artery disease) s/p stent 2008 in [place] 04/02/2012  . Hypertension     Expected Discharge Date: Expected Discharge Date: 04/14/16  Team Members Present: Physician leading conference: Dr. Maryla MorrowAnkit Patel Social Worker Present: Dossie DerBecky Elbia Paro, LCSW Nurse Present: Chana Bodeeborah Sharp, RN PT Present: Wanda Plumparoline Cook, PT OT Present: Perrin MalteseJames McGuire, Marye RoundT;Jennifer Smith, OT SLP Present: Jackalyn LombardNicole Page, SLP PPS Coordinator present : Tora DuckMarie Noel, RN, CRRN     Current Status/Progress Goal Weekly Team Focus  Medical   Poor safety awareness, cognitive deficits, weakness secondary to anoxic BI with hx of CVA  Improve cognition, mobility, BP  See above   Bowel/Bladder   continent B/B. LBM 12/25. C/O dysuria. U/A completed. PVR PRN shift.  Continues to be continent of B/B and no dysuria.  Monitor urine I/O and measure PVR.   Swallow/Nutrition/ Hydration   Dys 2, thin liquids; full supervision for use of precautions and tray set up   mod I   trials of advanced  textures    ADL's   mod-max A overall due to L side weakness, apraxia, visual deficits  S overall  ADL training, visual compensatory strategies, coordination, NMR LUE, cognitive remediation, pt/family education   Mobility   limited by vision deficits , motor planning and tremors; min> S  assist for gait x 200' up/down 12 steps 2 rails  supervision overall for gait x 150' and up/down 4 steps   neuro re-ed, balance, mobility and locomotion    Communication             Safety/Cognition/ Behavioral Observations            Pain   Denies any pain.  Continue to be free of pain or discomfort.  Monitor and assess pain Q shift and PRN.   Skin        na        *See Care Plan and progress notes for long and short-term goals.  Barriers to Discharge: safety awareness, cognition, mobility, BP    Possible Resolutions to Barriers:  Optimize BP meds, therapies    Discharge Planning/Teaching Needs:  Home with sister who has assisted him in the past after previous hospitalizations. Pt tends to do what he wants to do.       Team Discussion:  Goals supervision level-checking UA due to having symptoms of UTI. Fatigues easily and needs frequent rest breaks. L-inattention and tremors. SP seeing 3x week getting close to baseline. Visual deficits-limit him. MD adjusting BP meds. Would benefit from neuro-psych due to seems depressed.  Revisions to Treatment Plan:  DC 1/3   Continued Need for Acute Rehabilitation Level of Care: The patient requires daily medical management by a physician with specialized training in physical medicine and rehabilitation for the following conditions: Daily direction of a multidisciplinary physical rehabilitation program to ensure safe treatment while eliciting the highest outcome that is of practical value to the patient.: Yes Daily medical management of patient stability for increased activity during participation in an intensive rehabilitation regime.: Yes Daily analysis of laboratory values and/or radiology reports with any subsequent need for medication adjustment of medical intervention for : Neurological problems;Blood pressure problems  Bruce Churilla, Lemar Livings 04/07/2016, 12:44 PM

## 2016-04-07 NOTE — Progress Notes (Signed)
Physical Therapy Session Note  Patient Details  Name: Randall Patel MRN: [id number] Date of Birth: 1950/07/09  Today's Date: 04/07/2016 PT Individual Time: 1430-1530 PT Individual Time Calculation (min): 60 min    Short Term Goals: Week 1:  PT Short Term Goal 1 (Week 1): Pt will perform functional transfers with min A PT Short Term Goal 2 (Week 1): pt will demo emergent awareness with mod A PT Short Term Goal 3 (Week 1): pt will demo dynamic sitting balance with supervision  Skilled Therapeutic Interventions/Progress Updates:   Pt presented in bed requesting to void. Supervision supine to sit with bed flat and use of bed rail. Supervision toilet transfer with pt requiring assistance for doffing brief. After toileting standing balance at sink to wash hands with cues to incorporate LUE use into washing. Gait to rehab gym with noted guarding of LUE. Stair training 6in x 12, 2 rails with hand over hand placement of LUE on rail. Standing balance/coordination activities including toe taps (alternating and targeted). Standing on compliant surface. Difficulty reaching target at time ?vision deficits as changed to color target with improved results. Standing balance with forced use of LUE. NuStep L3 x 60mn for reciprocating use of LUE, noted improvement of LUE placement upon returning to room. Pt request to return to bed with all needs met and sister present.   Therapy Documentation Precautions:  Precautions Precautions: Fall Precaution Comments: motor planning deficits Restrictions Weight Bearing Restrictions: No General:   Vital Signs: Therapy Vitals Temp: 98.1 F (36.7 C) Temp Source: Oral Pulse Rate: 62 Resp: 18 BP: (!) 141/83 (RN notified) Patient Position (if appropriate): Lying Oxygen Therapy SpO2: 100 % O2 Device: Not Delivered Pain:   Mobility:   Locomotion :    Trunk/Postural Assessment :    Balance:   Exercises:   Other Treatments:     See Function Navigator  for Current Functional Status.   Therapy/Group: Individual Therapy  Randall Patel  Randall Patel, PTA  04/07/2016, 4:17 PM

## 2016-04-07 NOTE — Progress Notes (Signed)
Occupational Therapy Weekly Progress Note  Patient Details  Name: Randall Patel MRN: [id number] Date of Birth: 1951-02-11  Beginning of progress report period: April 01, 2016 End of progress report period: April 07, 2016  Today's Date: 04/07/2016 OT Individual Time: 1105-1200 OT Individual Time Calculation (min): 55 min     Patient has met 3 of 4 short term goals.  Pt continues to make steady progress with OT.  He is completing functional mobility in the room to the shower and toilet with min assist.  UB and LB bathing are currently being completed at a min assist level with UB dressing still being at a max assist secondary to visual acuity issues as well as apraxia.  Lower body dressing is completed at a mod assist level at this time. Mr. Friedt continues to demonstrate ataxia and apraxia with LUE functional use.  Increased tone is also noted in the LUE during functional use.  He is currently using the LUE at a gross assist level for selfcare tasks.  Feel he will need continued OT to further progress to supervision level for established LTGs.    Patient continues to demonstrate the following deficits: muscle weakness, impaired timing and sequencing, motor apraxia, ataxia and decreased coordination, decreased visual acuity and decreased visual perceptual skills, decreased problem solving and delayed processing and decreased standing balance and decreased balance strategies and therefore will continue to benefit from skilled OT intervention to enhance overall performance with BADL and Reduce care partner burden.  Patient progressing toward long term goals..  Continue plan of care.  OT Short Term Goals Week 2:  OT Short Term Goal 1 (Week 2): Pt will donn pullover shirt with min assist.  OT Short Term Goal 2 (Week 2): Pt will complete LB bathing sit to stand with supervision.  OT Short Term Goal 3 (Week 2): Pt will donn pullover shirt with supervision. OT Short Term Goal 4 (Week 2): Pt  will use the LUE at a gross assist level for selfcare tasks with supervision OT Short Term Goal 5 (Week 2): Pt will complete shower transfers with supervision to the tub/shower bench.     Skilled Therapeutic Interventions/Progress Updates:    Pt worked on bathing and dressing during session.  Min guard assist for balance with mobility to and from the shower.  Bathing was completed sit to stand with mod questioning cueing for thoroughness.  He was able to use the LUE as a gross assist for washing the right arm with increased time.  Min assist washing LEs sit to stand.  He was able to locate and identify items in the shower secondary to having coban wrapped around them to increase contrast (hand held shower, grab bars, seat edge, soap bottle).   He needed mod assist for LB dressing as well as max assist for UB to orient clothing secondary to vision as well as to help open up the waist band and bottom of the shirt for him to get started.  Pt tends to keep the left elbow flexed and needs encouragement to attempt functional use for pulling up brief and pants.  Mod assist for donning gripper socks with one handed technique using the RUE.  Pt left in wheelchair with call button in reach and safety belt in place in preparation for lunch.      Therapy Documentation Precautions:  Precautions Precautions: Fall Precaution Comments: motor planning deficits Restrictions Weight Bearing Restrictions: No  Pain: Pain Assessment Pain Assessment: No/denies pain ADL: See Function Navigator for  Current Functional Status.   Therapy/Group: Individual Therapy  Saajan Willmon OTR/L 04/07/2016, 12:36 PM

## 2016-04-08 ENCOUNTER — Inpatient Hospital Stay (HOSPITAL_COMMUNITY): Payer: Medicare HMO | Admitting: Occupational Therapy

## 2016-04-08 ENCOUNTER — Inpatient Hospital Stay (HOSPITAL_COMMUNITY): Payer: Medicare Other | Admitting: Physical Therapy

## 2016-04-08 ENCOUNTER — Inpatient Hospital Stay (HOSPITAL_COMMUNITY): Payer: Medicare HMO

## 2016-04-08 ENCOUNTER — Inpatient Hospital Stay (HOSPITAL_COMMUNITY): Payer: Medicare HMO | Admitting: Speech Pathology

## 2016-04-08 LAB — URINE CULTURE

## 2016-04-08 NOTE — Progress Notes (Signed)
Physical Therapy Session Note  Patient Details  Name: Randall Patel MRN: [id number] Date of Birth: [birth year]  Today's Date: 04/08/2016 PT Individual Time: 1330-1430 PT Individual Time Calculation (min): 60 min    Short Term Goals: Week 1:  PT Short Term Goal 1 (Week 1): Pt will perform functional transfers with min A PT Short Term Goal 1 - Progress (Week 1): Met PT Short Term Goal 2 (Week 1): pt will demo emergent awareness with mod A PT Short Term Goal 2 - Progress (Week 1): Met PT Short Term Goal 3 (Week 1): pt will demo dynamic sitting balance with supervision PT Short Term Goal 3 - Progress (Week 1): Met Week 2:  PT Short Term Goal 1 (Week 2): = LTGs due to LOS  Skilled Therapeutic Interventions/Progress Updates:     Received with trade off from PT in hall.    PT transported patient to gift shop in [facility]. Gait training through simulated community environment of hospital gift shop x 188f with supervision assist. Pt was able to negotiate all obstcles with only verbal cues for awareness of L side.    Dynamic gait through Obstacle course including stepping up/over Airex pad, weave through 6 cones, stepping ober 3 obstacles (1-6 inches) ascent/descent 5 inch curb. Completed x 4 with min assist overall with mod assist x 1 to prevent LOB with stepping over 6 inch obstacle  Step latter dynamic balance training: One foot in each rung Side stepping In/out with mild success due to decreased problem solving  SLS in each space PT provided min assist throughout for safety as well as mod cues for proper sequencing when leading with the LLE for side stepping and in/out. ad  Gait back to room with supervision assist from PT for safety. No LOB noted.  Patient performed sit>supine with supervision assist and min cues from PT proper positioning in bed.  Left supine in bed with call bell in reach and bed alarm on.      Therapy Documentation Precautions:  Precautions Precautions:  Fall Precaution Comments: motor planning deficits Restrictions Weight Bearing Restrictions: No Pain: 0/10   See Function Navigator for Current Functional Status.   Therapy/Group: Individual Therapy  ALorie Phenix12/28/2017, 2:29 PM

## 2016-04-08 NOTE — Progress Notes (Signed)
Speech Language Pathology Daily Session Note  Patient Details  Name: Randall Patel MRN: [id number] Date of Birth: January 21, 1951  Today's Date: 04/08/2016 SLP Individual Time: 0805-0900 SLP Individual Time Calculation (min): 55 min   Short Term Goals:Week 1: SLP Short Term Goal 1 (Week 1): STG=LTG due to ELOS   Skilled Therapeutic Interventions:  Pt was seen for skilled ST targeting goals for dysphagia.  Pt consumed dys 2 textures and thin liquids with min assist verbal cues for use of liquid wash to clear residual solids from the oral cavity post swallow.  No overt s/s of aspiration were evident with solids or liquids.  Oral care completed after meal to remove minimal, trace amounts of residuals.  Pt needed hand over hand steadying assist for both self feeding and during oral care due to tremors.  Pt left in bed with bed alarm set and call bell within reach.  Continue per current plan of care.       Function:  Eating Eating   Modified Consistency Diet: Yes Eating Assist Level: Help managing cup/glass;Hand over hand assist;Help with picking up utensils;Set up assist for   Eating Set Up Assist For: Opening containers;Cutting food       Cognition Comprehension Comprehension assist level: Understands complex 90% of the time/cues 10% of the time  Expression   Expression assist level: Expresses complex 90% of the time/cues < 10% of the time  Social Interaction Social Interaction assist level: Interacts appropriately 75 - 89% of the time - Needs redirection for appropriate language or to initiate interaction.  Problem Solving Problem solving assist level: Solves basic 50 - 74% of the time/requires cueing 25 - 49% of the time  Memory Memory assist level: Recognizes or recalls 25 - 49% of the time/requires cueing 50 - 75% of the time    Pain Pain Assessment Pain Assessment: No/denies pain  Therapy/Group: Individual Therapy  Zyden Suman, Melanee Spry 04/08/2016, 9:05 AM

## 2016-04-08 NOTE — Progress Notes (Signed)
Occupational Therapy Session Note  Patient Details  Name: Randall Patel MRN: [id number] Date of Birth: [birth year]  Today's Date: 04/08/2016 OT Individual Time: 1001-1103 OT Individual Time Calculation (min): 62 min     Short Term Goals: Week 2:  OT Short Term Goal 1 (Week 2): Pt will donn pullover shirt with min assist.  OT Short Term Goal 2 (Week 2): Pt will complete LB bathing sit to stand with supervision.  OT Short Term Goal 3 (Week 2): Pt will donn pullover shirt with supervision. OT Short Term Goal 4 (Week 2): Pt will use the LUE at a gross assist level for selfcare tasks with supervision OT Short Term Goal 5 (Week 2): Pt will complete shower transfers with supervision to the tub/shower bench.    Skilled Therapeutic Interventions/Progress Updates:    Mr. Hagans completed functional transfer supine to sit EOB with supervision and mod instructional cueing using the bed rail for support.  Min guard to ambulate to the toilet and to the shower seat.  Increased time needed for removal of clothing secondary to motor planning deficits in the LUE as well as decreased coordination.  Mod assist for use of the LUE to wash the right arm as well as mod instructional cueing for sequencing as he demonstrates decreased organization during bathing.  Decreased ability to hold onto the soap bottle with the LUE when attempting to remove the lid as well.  He needed max assist for orientation of clothing when attempting to donn them.  He was unable to manipulate his shirt in order to place over his head and arms when donning it.  Therapist assisted with gripper socks secondary to decreased time.    Therapy Documentation Precautions:  Precautions Precautions: Fall Precaution Comments: motor planning deficits Restrictions Weight Bearing Restrictions: No  Pain: Pain Assessment Pain Assessment: No/denies pain Pain Score: 0-No pain ADL: See Function Navigator for Current Functional  Status.   Therapy/Group: Individual Therapy  Altair Stanko OTR/L 04/08/2016, 1:09 PM

## 2016-04-08 NOTE — Progress Notes (Signed)
Physical Therapy Weekly Progress Note  Patient Details  Name: Randall Patel MRN: [id number] Date of Birth: September 17, 1950  Beginning of progress report period: 04/01/16 End of progress report period:12/128/17 Today's Date: 04/08/2016 PT Individual Time:1300-1330  -    30 min individual tx Patient has met 3 of 3 short term goals.   Patient continues to demonstrate the following deficits decreased cardiorespiratoy endurance, impaired timing and sequencing, unbalanced muscle activation, ataxia and decreased coordination, decreased visual perceptual skills, decreased attention, decreased awareness, decreased problem solving, decreased safety awareness, decreased memory and delayed processing and decreased standing balance, decreased postural control and decreased balance strategies and therefore will continue to benefit from skilled PT intervention to increase functional independence with mobility.  Patient progressing toward long term goals..  Continue plan of care.  PT Short Term Goals Week 1:  PT Short Term Goal 1 (Week 1): Pt will perform functional transfers with min A PT Short Term Goal 1 - Progress (Week 1): Met PT Short Term Goal 2 (Week 1): pt will demo emergent awareness with mod A PT Short Term Goal 2 - Progress (Week 1): Met PT Short Term Goal 3 (Week 1): pt will demo dynamic sitting balance with supervision PT Short Term Goal 3 - Progress (Week 1): Met  Skilled Therapeutic Interventions/Progress Updates:  Bed mobility with supervision.  Stand pivot transfer w/c> NuStep.  NuStep at level 5 x 4 extremities x 5 minutes for alerrnating reciprocal movement.  Gait without AD x 200' on level tile and carpet, with close supervision.  Cues for obstacles on L due to poor awareness and visual deficits. Pt left resting in w/c with quick release belt applied and all needs within reach.      Therapy Documentation Precautions:  Precautions Precautions: Fall Precaution Comments: motor planning  deficits Restrictions Weight Bearing Restrictions: No   Pain: Pain Assessment Pain Assessment: No/denies pain Pain Score: 0-No pain      See Function Navigator for Current Functional Status.  Therapy/Group: Individual Therapy  Decorian Schuenemann 04/08/2016, 1:17 PM

## 2016-04-08 NOTE — Progress Notes (Signed)
Subjective/Complaints: Up working with therapy this morning. No issues overnight  ROS: pt denies nausea, vomiting, diarrhea, cough, shortness of breath or chest pain     Objective: Vital Signs: Blood pressure 111/67, pulse 64, temperature 98 F (36.7 C), temperature source Oral, resp. rate 18, height _0  (1.651 m), weight 60.5 kg (133 lb 6.4 oz), SpO2 97 %. No results found. Results for orders placed or performed during the hospital encounter of 03/31/16 (from the past 72 hour(s))  CBC     Status: Abnormal   Collection Time: 04/06/16  5:21 AM  Result Value Ref Range   WBC 4.4 4.0 - 10.5 K/uL   RBC 4.62 4.22 - 5.81 MIL/uL   Hemoglobin 12.6 (L) 13.0 - 17.0 g/dL   HCT 38.5 (L) 39.0 - 52.0 %   MCV 83.3 78.0 - 100.0 fL   MCH 27.3 26.0 - 34.0 pg   MCHC 32.7 30.0 - 36.0 g/dL   RDW 14.2 11.5 - 15.5 %   Platelets 385 150 - 400 K/uL  Basic metabolic panel     Status: None   Collection Time: 04/06/16  5:21 AM  Result Value Ref Range   Sodium 137 135 - 145 mmol/L   Potassium 3.9 3.5 - 5.1 mmol/L   Chloride 102 101 - 111 mmol/L   CO2 25 22 - 32 mmol/L   Glucose, Bld 99 65 - 99 mg/dL   BUN 8 6 - 20 mg/dL   Creatinine, Ser 0.96 0.61 - 1.24 mg/dL   Calcium 9.9 8.9 - 10.3 mg/dL   GFR calc non Af Amer >60 >60 mL/min   GFR calc Af Amer >60 >60 mL/min    Comment: (NOTE) The eGFR has been calculated using the CKD EPI equation. This calculation has not been validated in all clinical situations. eGFR's persistently <60 mL/min signify possible Chronic Kidney Disease.    Anion gap 10 5 - 15  Urinalysis, Routine w reflex microscopic     Status: None   Collection Time: 04/06/16 11:51 PM  Result Value Ref Range   Color, Urine YELLOW YELLOW   APPearance CLEAR CLEAR   Specific Gravity, Urine 1.019 1.005 - 1.030   pH 5.0 5.0 - 8.0   Glucose, UA NEGATIVE NEGATIVE mg/dL   Hgb urine dipstick NEGATIVE NEGATIVE   Bilirubin Urine NEGATIVE NEGATIVE   Ketones, ur NEGATIVE NEGATIVE mg/dL    Protein, ur NEGATIVE NEGATIVE mg/dL   Nitrite NEGATIVE NEGATIVE   Leukocytes, UA NEGATIVE NEGATIVE      General: NAD.  Psych: flat but cooperative  Heart: RRR without JVD Lungs: CTA B Abdomen: Positive bowel sounds, soft  Skin: No evidence of breakdown, no evidence of rash Neurologic:  Motor: 4/5 in R deltoid, bicep, tricep, grip, Bilateral hip flexor, knee extensors, ankle dorsiflexor and plantar flexor.  3+/5 in left delt biceps triceps grip grossly. Alert and oriented Musculoskeletal: No edema. No tenderness.   Assessment/Plan: 1. 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 Physiatrist is providing close team supervision and 24 hour management of active medical problems listed below. Physiatrist and rehab team continue to assess barriers to discharge/monitor patient progress toward functional and medical goals. FIM: Function - Bathing Position: Shower Body parts bathed by patient: Left arm, Chest, Abdomen, Front perineal area, Buttocks, Right upper leg, Left upper leg, Right arm, Right lower leg, Left lower leg Body parts bathed by helper: Back Assist  Level: Touching or steadying assistance(Pt > 75%)  Function- Upper Body Dressing/Undressing What is the patient wearing?: Pull over shirt/dress Pull over shirt/dress - Perfomed by patient: Put head through opening, Thread/unthread right sleeve Pull over shirt/dress - Perfomed by helper: Thread/unthread right sleeve, Thread/unthread left sleeve, Put head through opening, Pull shirt over trunk Assist Level:  (Max A) Function - Lower Body Dressing/Undressing What is the patient wearing?: Non-skid slipper socks, Pants Position: Wheelchair/chair at sink Pants- Performed by patient: Thread/unthread right pants leg, Thread/unthread left pants leg, Pull pants up/down Pants- Performed by helper:  Thread/unthread right pants leg Non-skid slipper socks- Performed by patient: Don/doff right sock Non-skid slipper socks- Performed by helper: Don/doff left sock Assist for lower body dressing: Touching or steadying assistance (Pt > 75%)  Function - Toileting Toileting activity did not occur: No continent bowel/bladder event Toileting steps completed by patient: Adjust clothing prior to toileting, Performs perineal hygiene, Adjust clothing after toileting Toileting steps completed by helper: Adjust clothing prior to toileting, Adjust clothing after toileting Toileting Assistive Devices: Grab bar or rail Assist level: Touching or steadying assistance (Pt.75%)  Function - Air cabin crew transfer activity did not occur: Safety/medical concerns Toilet transfer assistive device: Grab bar Assist level to toilet: Touching or steadying assistance (Pt > 75%) Assist level from toilet: Touching or steadying assistance (Pt > 75%)  Function - Chair/bed transfer Chair/bed transfer method: Ambulatory Chair/bed transfer assist level: Touching or steadying assistance (Pt > 75%) Chair/bed transfer assistive device: Armrests Chair/bed transfer details: Verbal cues for precautions/safety, Verbal cues for technique  Function - Locomotion: Wheelchair Type: Manual Max wheelchair distance: 50 Assist Level: Moderate assistance (Pt 50 - 74%) Assist Level: Moderate assistance (Pt 50 - 74%) Turns around,maneuvers to table,bed, and toilet,negotiates 3% grade,maneuvers on rugs and over doorsills: No Function - Locomotion: Ambulation Assistive device: No device Max distance: 200 Assist level: Touching or steadying assistance (Pt > 75%) Assist level: Touching or steadying assistance (Pt > 75%) Assist level: Touching or steadying assistance (Pt > 75%) Assist level: Touching or steadying assistance (Pt > 75%) Assist level: Moderate assist (Pt 50 - 74%)  Function - Comprehension Comprehension:  Auditory Comprehension assist level: Understands complex 90% of the time/cues 10% of the time  Function - Expression Expression: Verbal Expression assist level: Expresses complex 90% of the time/cues < 10% of the time  Function - Social Interaction Social Interaction assist level: Interacts appropriately 75 - 89% of the time - Needs redirection for appropriate language or to initiate interaction.  Function - Problem Solving Problem solving assist level: Solves basic 50 - 74% of the time/requires cueing 25 - 49% of the time  Function - Memory Memory assist level: Recognizes or recalls 25 - 49% of the time/requires cueing 50 - 75% of the time Patient normally able to recall (first 3 days only): Current season, Location of own room, Staff names and faces, That he or she is in a hospital  Medical Problem List and Plan: 1.  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  Continue CIR therapies 2.  DVT Prophylaxis/Anticoagulation: Pharmaceutical: Lovenox 3. Pain Management: Tylenol prn 4. Mood:  LCSW to follow for evaluation and support.  5. Neuropsych: This patient is not fully capable of making decisions on his own behalf. 6. Skin/Wound Care: routine pressure relief measures.  7. Fluids/Electrolytes/Nutrition: Monitor I/O.   BMP WNL on 12/26  -continue to encourage PO 8. CAD: Off ASA, will inquire about resuming.  Continue  metoprolol.   BP within reasonable range at present  continue statin.  9. PEA: Felt to be due to respiratory event, overdose may have been initiating event with aspiration PNA---cardiology has signed off.  10. HTN: Monitor BP bid. Titrate medications as indicated.   Cont Norvasc 10  Cont metoprolol 100 BID  Con aldactone 25  Hydralizine increased to 75 on 12/23  Well Controlled at present 11. COPD with ongoing tobacco abuse: Respiratory status stable.  12. Dysphagia:  Continue dysphagia 2, thin liquids with full  supervision for safety.  13. Leucocytosis: resolved WBC 6.0 on 12/21 14.  Hx Polysub abuse- neuropsych as appropriate 15. ABLA  Hb 12.6 on 12/26     LOS (Days) 8 A FACE TO FACE EVALUATION WAS PERFORMED  Masayuki Sakai T 04/08/2016, 9:33 AM

## 2016-04-09 ENCOUNTER — Inpatient Hospital Stay (HOSPITAL_COMMUNITY): Payer: Medicare HMO

## 2016-04-09 ENCOUNTER — Inpatient Hospital Stay (HOSPITAL_COMMUNITY): Payer: Medicare HMO | Admitting: Occupational Therapy

## 2016-04-09 ENCOUNTER — Inpatient Hospital Stay (HOSPITAL_COMMUNITY): Payer: Medicare Other | Admitting: Physical Therapy

## 2016-04-09 NOTE — Progress Notes (Signed)
Subjective/Complaints: In bed. Arouses fairly easily. No new complaints.   ROS limited by pt engagement   Objective: Vital Signs: Blood pressure 136/71, pulse 70, temperature 99.3 F (37.4 C), temperature source Oral, resp. rate 16, height 5\' 5"  (1.651 m), weight 60.5 kg (133 lb 6.4 oz), SpO2 100 %. No results found. Results for orders placed or performed during the hospital encounter of 03/31/16 (from the past 72 hour(s))  Urinalysis, Routine w reflex microscopic     Status: None   Collection Time: 04/06/16 11:51 PM  Result Value Ref Range   Color, Urine YELLOW YELLOW   APPearance CLEAR CLEAR   Specific Gravity, Urine 1.019 1.005 - 1.030   pH 5.0 5.0 - 8.0   Glucose, UA NEGATIVE NEGATIVE mg/dL   Hgb urine dipstick NEGATIVE NEGATIVE   Bilirubin Urine NEGATIVE NEGATIVE   Ketones, ur NEGATIVE NEGATIVE mg/dL   Protein, ur NEGATIVE NEGATIVE mg/dL   Nitrite NEGATIVE NEGATIVE   Leukocytes, UA NEGATIVE NEGATIVE  Culture, Urine     Status: Abnormal   Collection Time: 04/06/16 11:51 PM  Result Value Ref Range   Specimen Description URINE, CLEAN CATCH    Special Requests NONE    Culture MULTIPLE SPECIES PRESENT, SUGGEST RECOLLECTION (A)    Report Status 04/08/2016 FINAL       General: NAD.  Psych: flat but cooperative  Heart: RRR without JVD Lungs: CTA B Abdomen: Positive bowel sounds, soft  Skin: No evidence of breakdown, no evidence of rash Neurologic:  Motor: 4/5 in R deltoid, bicep, tricep, grip, Bilateral hip flexor, knee extensors, ankle dorsiflexor and plantar flexor.  3+/5 in left delt biceps triceps and grip. Alert Musculoskeletal: No edema. No tenderness.   Assessment/Plan: 1. 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 Physiatrist is providing close team supervision and 24 hour management of active medical problems listed  below. Physiatrist and rehab team continue to assess barriers to discharge/monitor patient progress toward functional and medical goals. FIM: Function - Bathing Position: Shower Body parts bathed by patient: Left arm, Chest, Abdomen, Front perineal area, Buttocks, Right upper leg, Left upper leg, Right lower leg, Left lower leg Body parts bathed by helper: Back, Right arm Assist Level: More than reasonable time  Function- Upper Body Dressing/Undressing What is the patient wearing?: Pull over shirt/dress Pull over shirt/dress - Perfomed by patient: Put head through opening Pull over shirt/dress - Perfomed by helper: Thread/unthread right sleeve, Thread/unthread left sleeve, Pull shirt over trunk Assist Level:  (Max A) Function - Lower Body Dressing/Undressing What is the patient wearing?: Pants, Non-skid slipper socks Position: Wheelchair/chair at sink Pants- Performed by patient: Thread/unthread right pants leg, Thread/unthread left pants leg, Pull pants up/down Pants- Performed by helper: Thread/unthread right pants leg Non-skid slipper socks- Performed by patient: Don/doff right sock Non-skid slipper socks- Performed by helper: Don/doff right sock, Don/doff left sock Assist for lower body dressing: Touching or steadying assistance (Pt > 75%)  Function - Toileting Toileting activity did not occur: No continent bowel/bladder event Toileting steps completed by patient: Adjust clothing prior to toileting, Performs perineal hygiene, Adjust clothing after toileting Toileting steps completed by helper: Adjust clothing prior to toileting, Adjust clothing after toileting Toileting Assistive Devices: Grab bar or rail Assist level: Touching or steadying assistance (Pt.75%)  Function - Archivist transfer activity did not occur: Safety/medical concerns Toilet transfer assistive device: Grab bar Assist level to toilet:  Touching or steadying assistance (Pt > 75%) Assist level from  toilet: Touching or steadying assistance (Pt > 75%)  Function - Chair/bed transfer Chair/bed transfer method: Ambulatory Chair/bed transfer assist level: Supervision or verbal cues Chair/bed transfer assistive device: Armrests Chair/bed transfer details: Verbal cues for precautions/safety, Verbal cues for technique  Function - Locomotion: Wheelchair Type: Manual Max wheelchair distance: 50 Assist Level: Moderate assistance (Pt 50 - 74%) Assist Level: Moderate assistance (Pt 50 - 74%) Turns around,maneuvers to table,bed, and toilet,negotiates 3% grade,maneuvers on rugs and over doorsills: No Function - Locomotion: Ambulation Assistive device: No device Max distance: 200 Assist level: Supervision or verbal cues Assist level: Supervision or verbal cues Assist level: Supervision or verbal cues Assist level: Supervision or verbal cues Assist level: Moderate assist (Pt 50 - 74%)  Function - Comprehension Comprehension: Auditory Comprehension assist level: Understands complex 90% of the time/cues 10% of the time  Function - Expression Expression: Verbal Expression assist level: Expresses basic needs/ideas: With no assist  Function - Social Interaction Social Interaction assist level: Interacts appropriately 75 - 89% of the time - Needs redirection for appropriate language or to initiate interaction.  Function - Problem Solving Problem solving assist level: Solves basic 50 - 74% of the time/requires cueing 25 - 49% of the time  Function - Memory Memory assist level: Recognizes or recalls 25 - 49% of the time/requires cueing 50 - 75% of the time Patient normally able to recall (first 3 days only): Current season, Location of own room, Staff names and faces, That he or she is in a hospital  Medical Problem List and Plan: 1.  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  Continue CIR therapies 2.  DVT Prophylaxis/Anticoagulation:  Pharmaceutical: Lovenox 3. Pain Management: Tylenol prn 4. Mood:  LCSW to follow for evaluation and support.  5. Neuropsych: This patient is not fully capable of making decisions on his own behalf. 6. Skin/Wound Care: routine pressure relief measures.  7. Fluids/Electrolytes/Nutrition: Monitor I/O.   BMP WNL on 12/26  -continue to encourage PO 8. CAD: Off ASA, will inquire about resuming.  Continue metoprolol.   BP within reasonable range at present  continue statin.  9. PEA: Felt to be due to respiratory event, overdose may have been initiating event with aspiration PNA---cardiology has signed off.  10. HTN: Monitor BP bid. Titrate medications as indicated.   Cont Norvasc 10  Cont metoprolol 100 BID  Con aldactone 25  Hydralizine increased to 75 on 12/23  Well Controlled at present 11. COPD with ongoing tobacco abuse: Respiratory status stable.  12. Dysphagia:  Continue dysphagia 2, thin liquids with full supervision for safety.  13. Leucocytosis: resolved WBC 6.0 on 12/21 14.  Hx Polysub abuse- neuropsych as appropriate 15. ABLA  Hb 12.6 on 12/26     LOS (Days) 9 A FACE TO FACE EVALUATION WAS PERFORMED  SWARTZ,ZACHARY T 04/09/2016, 10:17 AM

## 2016-04-09 NOTE — Progress Notes (Signed)
Occupational Therapy Session Note  Patient Details  Name: Randall Patel MRN: [id number] Date of Birth: 1950/08/04  Today's Date: 04/09/2016 OT Individual Time: 1003-1100 OT Individual Time Calculation (min): 57 min     Short Term Goals: Week 2:  OT Short Term Goal 1 (Week 2): Pt will donn pullover shirt with min assist.  OT Short Term Goal 2 (Week 2): Pt will complete LB bathing sit to stand with supervision.  OT Short Term Goal 3 (Week 2): Pt will donn pullover shirt with supervision. OT Short Term Goal 4 (Week 2): Pt will use the LUE at a gross assist level for selfcare tasks with supervision OT Short Term Goal 5 (Week 2): Pt will complete shower transfers with supervision to the tub/shower bench.    Skilled Therapeutic Interventions/Progress Updates:    Pt transitioned to EOB with supervision to begin session.  Worked on visual tracking with pt following yellow star in all quadrants.  He was able to follow with both eyes but noted jerkiness with this.  Next had him attempt to identify numbers on the digital clock on the wall.  He was unable to see any of them.  Therapist provided large print letters for pt to identify with sizes greater than 2 inches.  He was not able to see any of them either, however he could identify objects on his table without difficulty including cup, spoon, and kleenexes.  Progressed to use of the [employer] next.  He was able to complete 2 60 second intervals with the greatest difficulty being noted in the left lower quadrant.  On first interval it took an average of 10 seconds to locate ones in the left lower quadrant.  Second interval it improved to 6 seconds.  Pt at times using both the LUE and RUE to press out the lights.  Still with trunk ataxia as well as BUE ataxia as well.  Finished session with return to the room for toileting and washing hands at the sink.  Min assist for managing clothing down prior to toileting with min guard to pull pants over hips after  completion.  Pt left in wheelchair with call button in lap and safety belt in place.   Therapy Documentation Precautions:  Precautions Precautions: Fall Precaution Comments: ataxia, motor planning deficits Restrictions Weight Bearing Restrictions: No General:  Pain: Pain Assessment Pain Assessment: No/denies pain ADL: See Function Navigator for Current Functional Status.   Therapy/Group: Individual Therapy  Candies Palm OTR/L 04/09/2016, 12:57 PM

## 2016-04-09 NOTE — Progress Notes (Signed)
Physical Therapy Session Note  Patient Details  Name: Randall Patel MRN: [id number] Date of Birth: 1950/09/26  Today's Date: 04/09/2016 PT Individual Time: 1102-1200AND  1500-1530 PT Individual Time Calculation (min): 30 min AND 58 min    Short Term Goals: Week 2:  PT Short Term Goal 1 (Week 2): = LTGs due to LOS  Skilled Therapeutic Interventions/Progress Updates:   Pt received sitting in WC and agreeable to PT  PT instructed patient in gait training to rehab gym without AD and supervision assist from PT.   Biodex blaance training on LOS with supervision -min assist and requiring max cues for target due to visual deficits. And   Dynavision program A. Score 13. With avgerage reaction time 4.; decreased reaction time in the Lower Right quadrant.   Nustep level 3-5 reciprocal movement training x 10 minutes with min cues for attention to task.   Step training up/down 4 steps with 2 UE support and supervision assist from PT  Up/down 1 step with 1 UE support x 4 and no UE support x 4. Min assist without UE support mod cues from PT for step to gait pattern and improved weight shifting.   Session 2  Pt received supine in bed and agreeable to PT. Supine>sit transfer with min assist and mincues for safety  PT instructed patient in HEP with hand outs provided.  [place] level A HEP. Mini squat, hip abduction, sit<>stand, tandem stand, LAQ, HS curl [place] level B HEP. Side stepping, backward gait, tandem gait, figure 8.   Supervision assist from PT throughout exercise programs with min cues for proper speed of movement and awareness of L side in dynamic movement.   Patient returned to room and left in bed following supervision transfers to supine. Call bell in reach.        Therapy Documentation Precautions: Precautions Precautions: Fall Precaution Comments: motor planning deficits Restrictions Weight Bearing Restrictions: No General:   Vital Signs: Therapy Vitals Temp: 99.3 F  (37.4 C) Temp Source: Oral Pulse Rate: 70 Resp: 16 BP: 136/71 Patient Position (if appropriate): Lying Oxygen Therapy SpO2: 100 % O2 Device: Not Delivered   See Function Navigator for Current Functional Status.   Therapy/Group: Individual Therapy  Golden Pop 04/09/2016, 7:51 AM

## 2016-04-09 NOTE — Progress Notes (Addendum)
Speech Language Pathology Daily Session Note  Patient Details  Name: Randall Patel MRN: [id number] Date of Birth: [birth year]  Today's Date: 04/09/2016 SLP Individual Time: 0700-0750 SLP Individual Time Calculation (min): 50 min   Short Term Goals: Week 1: SLP Short Term Goal 1 (Week 1): STG=LTG due to ELOS   Skilled Therapeutic Interventions:  Pt was seen for skilled ST targeting dysphagia goals.  Pt was asleep upon arrival but awakened easily and was participatory in ST with min-mod encouragement.  SLP facilitated the session with a trial snack of dys 3 and regular textures to continue working towards diet advancement.  Pt demonstrated good clearance of both textures with min-supervision verbal cues for use of liquid wash.  Pt had x1 episode of anterior labial spillage of thin liquids in copious amounts due to decreased attention to bolus.  Minimal residue was cleared from the oral cavity during oral care after snack was completed.  Pt declined full breakfast meal and RN reports that pt has had minimal intake over the last 24 hours.  Recommend a trial meal tray of regular textures prior to diet advancement given that pt will likely resume regular textures in the home environment regardless of ST diet recs. Pt minimally engaged during therapy session and requested to return to bed multiple times throughout today's therapy session.  As a result, session was ended early.  Pt was left in bed with bed alarm set and call bell within reach.  Continue per current plan of care.     Function:  Eating Eating   Modified Consistency Diet: Yes Eating Assist Level: Set up assist for;Helper brings food to mouth;Supervision or verbal cues;Helper checks for pocketed food   Eating Set Up Assist For: Opening containers       Cognition Comprehension Comprehension assist level: Understands complex 90% of the time/cues 10% of the time  Expression   Expression assist level: Expresses basic needs/ideas: With  no assist  Social Interaction Social Interaction assist level: Interacts appropriately 75 - 89% of the time - Needs redirection for appropriate language or to initiate interaction.  Problem Solving Problem solving assist level: Solves basic 50 - 74% of the time/requires cueing 25 - 49% of the time  Memory Memory assist level: Recognizes or recalls 25 - 49% of the time/requires cueing 50 - 75% of the time    Pain Pain Assessment Pain Assessment: No/denies pain  Therapy/Group: Individual Therapy  Daniah Zaldivar, Melanee Spry 04/09/2016, 8:01 AM

## 2016-04-10 ENCOUNTER — Ambulatory Visit (HOSPITAL_COMMUNITY): Payer: Medicare HMO | Admitting: Physical Therapy

## 2016-04-10 ENCOUNTER — Inpatient Hospital Stay (HOSPITAL_COMMUNITY): Payer: Medicare HMO

## 2016-04-10 ENCOUNTER — Inpatient Hospital Stay (HOSPITAL_COMMUNITY): Payer: Medicare HMO | Admitting: Speech Pathology

## 2016-04-10 ENCOUNTER — Inpatient Hospital Stay (HOSPITAL_COMMUNITY): Payer: Medicare HMO | Admitting: Occupational Therapy

## 2016-04-10 DIAGNOSIS — E440 Moderate protein-calorie malnutrition: Secondary | ICD-10-CM

## 2016-04-10 MED ORDER — MEGESTROL ACETATE 400 MG/10ML PO SUSP
400.0000 mg | Freq: Every day | ORAL | Status: DC
  Administered 2016-04-10 – 2016-04-14 (×5): 400 mg via ORAL
  Filled 2016-04-10 (×5): qty 10

## 2016-04-10 NOTE — Progress Notes (Signed)
Subjective/Complaints: Lying in bed. Denies any new problems. Not eating much at all. Denies pain  ROS: pt denies nausea, vomiting, diarrhea, cough, shortness of breath or chest pain    Objective: Vital Signs: Blood pressure (!) 145/77, pulse 80, temperature 98.2 F (36.8 C), temperature source Oral, resp. rate 18, height 5\' 5"  (1.651 m), weight 60.5 kg (133 lb 6.4 oz), SpO2 100 %. No results found. No results found for this or any previous visit (from the past 72 hour(s)).    General: NAD.  Psych: remains flat  Heart: RRR Lungs: CTA bilaterally Abdomen: Positive bowel sounds, soft  Skin: No evidence of breakdown, no evidence of rash Neurologic:  Motor: 4/5 in R deltoid, bicep, tricep, grip, Bilateral hip flexor, knee extensors, ankle dorsiflexor and plantar flexor.  3+ to 4-/5 in left delt biceps triceps and grip. Alert Musculoskeletal: No edema. No tenderness.   Assessment/Plan: 1. 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 Physiatrist is providing close team supervision and 24 hour management of active medical problems listed below. Physiatrist and rehab team continue to assess barriers to discharge/monitor patient progress toward functional and medical goals. FIM: Function - Bathing Position: Shower Body parts bathed by patient: Left arm, Chest, Abdomen, Front perineal area, Buttocks, Right upper leg, Left upper leg, Right lower leg, Left lower leg Body parts bathed by helper: Back, Right arm Assist Level: More than reasonable time  Function- Upper Body Dressing/Undressing What is the patient wearing?: Pull over shirt/dress Pull over shirt/dress - Perfomed by patient: Put head through opening Pull over shirt/dress - Perfomed by helper: Thread/unthread right sleeve, Thread/unthread left sleeve, Pull shirt over trunk Assist Level:  (Max  A) Function - Lower Body Dressing/Undressing What is the patient wearing?: Pants, Non-skid slipper socks Position: Wheelchair/chair at sink Pants- Performed by patient: Thread/unthread right pants leg, Thread/unthread left pants leg, Pull pants up/down Pants- Performed by helper: Thread/unthread right pants leg Non-skid slipper socks- Performed by patient: Don/doff right sock Non-skid slipper socks- Performed by helper: Don/doff right sock, Don/doff left sock Assist for lower body dressing: Touching or steadying assistance (Pt > 75%)  Function - Toileting Toileting activity did not occur: No continent bowel/bladder event Toileting steps completed by patient: Adjust clothing prior to toileting Toileting steps completed by helper: Performs perineal hygiene, Adjust clothing after toileting Toileting Assistive Devices: Grab bar or rail Assist level: Touching or steadying assistance (Pt.75%)  Function - ArchivistToilet Transfers Toilet transfer activity did not occur: Safety/medical concerns Toilet transfer assistive device: Grab bar Assist level to toilet: Supervision or verbal cues Assist level from toilet: Supervision or verbal cues  Function - Chair/bed transfer Chair/bed transfer method: Ambulatory Chair/bed transfer assist level: Supervision or verbal cues Chair/bed transfer assistive device: Armrests Chair/bed transfer details: Verbal cues for precautions/safety, Verbal cues for technique  Function - Locomotion: Wheelchair Type: Manual Max wheelchair distance: 50 Assist Level: Moderate assistance (Pt 50 - 74%) Assist Level: Moderate assistance (Pt 50 - 74%) Turns around,maneuvers to table,bed, and toilet,negotiates 3% grade,maneuvers on rugs and over doorsills: No Function - Locomotion: Ambulation Assistive device: No device Max distance: [id number] Assist level: Supervision or verbal cues Assist level: Supervision or verbal cues Assist level: Supervision or verbal cues Assist level:  Supervision or verbal cues Assist level: Moderate assist (Pt 50 - 74%)  Function - Comprehension Comprehension: Auditory Comprehension assist level: Follows basic conversation/direction with no assist  Function -  Expression Expression: Verbal Expression assist level: Expresses basic needs/ideas: With no assist  Function - Social Interaction Social Interaction assist level: Interacts appropriately with others - No medications needed.  Function - Problem Solving Problem solving assist level: Solves basic 50 - 74% of the time/requires cueing 25 - 49% of the time  Function - Memory Memory assist level: Recognizes or recalls 25 - 49% of the time/requires cueing 50 - 75% of the time Patient normally able to recall (first 3 days only): Current season, Location of own room, Staff names and faces, That he or she is in a hospital  Medical Problem List and Plan: 1.  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  Continue PT, OT, SLP 2.  DVT Prophylaxis/Anticoagulation: Pharmaceutical: Lovenox 3. Pain Management: Tylenol prn 4. Mood:  LCSW to follow for evaluation and support.  5. Neuropsych: This patient is not fully capable of making decisions on his own behalf. 6. Skin/Wound Care: routine pressure relief measures.  7. Fluids/Electrolytes/Nutrition: Monitor I/O.   BMP WNL on 12/26---recheck Monday  -eating next to nothing  -will initiate megace  -will ask RD for recs 8. CAD: Off ASA, will inquire about resuming.  Continue metoprolol.   BP within reasonable range at present  continue statin.  9. PEA: Felt to be due to respiratory event, overdose may have been initiating event with aspiration PNA---cardiology has signed off.  10. HTN: Monitor BP bid. Titrate medications as indicated.   Cont Norvasc 10  Cont metoprolol 100 BID  Con aldactone 25  Hydralizine increased to 75 on 12/23   Controlled at present 11. COPD with ongoing tobacco  abuse: Respiratory status stable.  12. Dysphagia:  Continue dysphagia 2, thin liquids with full supervision for safety.  13. Leucocytosis: resolved WBC 6.0 on 12/21 14.  Hx Polysub abuse- neuropsych as appropriate 15. ABLA  Hb 12.6 on 12/26     LOS (Days) 10 A FACE TO FACE EVALUATION WAS PERFORMED  Loki Wuthrich T 04/10/2016, 9:38 AM

## 2016-04-10 NOTE — Progress Notes (Signed)
Physical Therapy Session Note  Patient Details  Name: Randall Patel MRN: [id number] Date of Birth: [birth year]  Today's Date: 04/10/2016 PT Concurrent Time: 0910-1000 PT Concurrent Time Calculation (min): 50 min   Short Term Goals: Week 2:  PT Short Term Goal 1 (Week 2): = LTGs due to LOS  Skilled Therapeutic Interventions/Progress Updates:    Pt in bed with RN present doing assessment at start of session, missed ten minutes at beginning of session.  Pt with no c/o pain but reports fatigue, agreeable to therapy session.  Session focus on gait training, NMR, transfers, standing balance, visual scanning, and overall activity tolerance.    Pt requiring steady assist throughout session today due to increased tremors throughout whole body.  Pt transfers with steady assist from EOB, nustep, and edge of mat.  Nustep x12 minutes at level 7 for strengthening, activity tolerance, reciprocal stepping pattern NMR, and forced attention to LUE to maintain grip on handle.    Gait training through obstacle course with HHA, focus on visual scanning for obstacles as well as safely navigating around/over obstacles and on compliant surfaces.  Pt requires min cues for attention to obstacles under foot 2/2 visual deficits.    Static standing balance activity focus on visual scanning and standing tolerance to match colored disks to each other with overhead reaching. Pt ambulated back to room at end of session pushing w/c.  Positioned in bed with supervision, with call bell in reach and needs met.    Therapy Documentation Precautions:  Precautions Precautions: Fall Precaution Comments: ataxia, motor planning deficits Restrictions Weight Bearing Restrictions: No General: PT Amount of Missed Time (min): 10 Minutes PT Missed Treatment Reason: Nursing care   See Function Navigator for Current Functional Status.   Therapy/Group: Individual Therapy  Earnest Conroy Penven-Crew 04/10/2016, 12:07 PM

## 2016-04-10 NOTE — Progress Notes (Signed)
Speech Language Pathology Daily Session Note  Patient Details  Name: Randall Patel MRN: [id number] Date of Birth: 1951-01-17  Today's Date: 04/10/2016 SLP Individual Time: 1300-1330 SLP Individual Time Calculation (min): 30 min   Short Term Goals: Week 1: SLP Short Term Goal 1 (Week 1): STG=LTG due to ELOS   Skilled Therapeutic Interventions: Skilled treatment session focused on dysphagia goals. SLP facilitated the session with a trial snack of Dys. 3 textures to continue working towards diet advancement.  Patient demonstrated good oral clearance with min-supervision verbal cues for use of liquid wash with extra time for efficient mastication.  Recommend a trial meal tray of Dys. 3 textures prior to diet advancement. Patient left upright in bed with RN in room. Continue with current plan of care.   Function:  Eating Eating   Modified Consistency Diet: Yes Eating Assist Level: Set up assist for;More than reasonable amount of time;Supervision or verbal cues   Eating Set Up Assist For: Opening containers       Cognition Comprehension Comprehension assist level: Follows basic conversation/direction with no assist  Expression   Expression assist level: Expresses basic 75 - 89% of the time/requires cueing 10 - 24% of the time. Needs helper to occlude trach/needs to repeat words.  Social Interaction Social Interaction assist level: Interacts appropriately with others - No medications needed.  Problem Solving Problem solving assist level: Solves basic 50 - 74% of the time/requires cueing 25 - 49% of the time  Memory Memory assist level: Recognizes or recalls 25 - 49% of the time/requires cueing 50 - 75% of the time    Pain No reports of pain   Therapy/Group: Individual Therapy  Wilhelmina Hark 04/10/2016, 3:16 PM

## 2016-04-10 NOTE — Progress Notes (Signed)
Occupational Therapy Session Note  Patient Details  Name: Randall Patel MRN: [id number] Date of Birth: 1950/04/29  Today's Date: 04/10/2016 OT Group Time: 1100-1200 OT Group Time Calculation (min): 60 min  Short Term Goals: Week 2:  OT Short Term Goal 1 (Week 2): Pt will donn pullover shirt with min assist.  OT Short Term Goal 2 (Week 2): Pt will complete LB bathing sit to stand with supervision.  OT Short Term Goal 3 (Week 2): Pt will donn pullover shirt with supervision. OT Short Term Goal 4 (Week 2): Pt will use the LUE at a gross assist level for selfcare tasks with supervision OT Short Term Goal 5 (Week 2): Pt will complete shower transfers with supervision to the tub/shower bench.    Skilled Therapeutic Interventions/Progress Updates: Therapeutic group activity with focus on dynamic standing balance, RUE NMR and improved attention/problem-solving.   Pt assisted out of bed and ambulated w/o device with OT providing escort to rehab gym with standby assist and min vc for directions.   Pt participated in 40 min of Wii game play, alternating with group member with mod vc to sequence and consistent min physical assist to problem-solve use of controller.  Pt returned to his room at end of session and attempted BM with supervision but was unproductive after 5 min and assist back to bed to rest.  Therapy Documentation Precautions:  Precautions Precautions: Fall Precaution Comments: ataxia, motor planning deficits Restrictions Weight Bearing Restrictions: No  Vital Signs: Therapy Vitals Pulse Rate: 80 BP: (!) 145/77   Pain: No/denies pain  See Function Navigator for Current Functional Status.   Therapy/Group: Group Therapy  Karysa Heft 04/10/2016, 12:31 PM

## 2016-04-10 NOTE — Progress Notes (Signed)
Occupational Therapy Session Note  Patient Details  Name: Randall Patel MRN: [id number] Date of Birth: 1950/11/20  Today's Date: 04/10/2016 OT Individual Time: 1400-1445 OT Individual Time Calculation (min): 45 min   Short Term Goals: Week 2:  OT Short Term Goal 1 (Week 2): Pt will donn pullover shirt with min assist.  OT Short Term Goal 2 (Week 2): Pt will complete LB bathing sit to stand with supervision.  OT Short Term Goal 3 (Week 2): Pt will donn pullover shirt with supervision. OT Short Term Goal 4 (Week 2): Pt will use the LUE at a gross assist level for selfcare tasks with supervision OT Short Term Goal 5 (Week 2): Pt will complete shower transfers with supervision to the tub/shower bench.    Skilled Therapeutic Interventions/Progress Updates:    1:1 OT session focused on visual scanning, L side attention, standing tolerance, and L fine-motor coordination. Dynavision completed in standing for 2, 2 minute intervals with focus on L attention, and standing endurance. Graded large peg board activity using L UE . Pt able to copy pattern with min Vc, but increased difficulty w/ fine-motor task 2/2 tremors which increased with fatigue. Provided pt with weighted utensil to increase independence with self-feeding limited by intention tremor. Pt returned to bed at end of session and left with needs met and bed alarm on.   Therapy Documentation Precautions:  Precautions Precautions: Fall Precaution Comments: ataxia, motor planning deficits Restrictions Weight Bearing Restrictions: No Pain:  denies pain  See Function Navigator for Current Functional Status.   Therapy/Group: Individual Therapy  Valma Cava 04/10/2016, 2:17 PM

## 2016-04-11 LAB — BASIC METABOLIC PANEL
ANION GAP: 10 (ref 5–15)
BUN: 8 mg/dL (ref 6–20)
CHLORIDE: 103 mmol/L (ref 101–111)
CO2: 23 mmol/L (ref 22–32)
Calcium: 9.9 mg/dL (ref 8.9–10.3)
Creatinine, Ser: 0.92 mg/dL (ref 0.61–1.24)
GFR calc Af Amer: >60 mL/min (ref >60)
GFR calc non Af Amer: >60 mL/min (ref >60)
GLUCOSE: 79 mg/dL (ref 65–99)
POTASSIUM: 3.4 mmol/L — AB (ref 3.5–5.1)
Sodium: 136 mmol/L (ref 135–145)

## 2016-04-11 LAB — PREALBUMIN: Prealbumin: 31.4 mg/dL (ref 18–38)

## 2016-04-11 MED ORDER — ADULT MULTIVITAMIN LIQUID CH
15.0000 mL | Freq: Every day | ORAL | Status: DC
  Administered 2016-04-11 – 2016-04-14 (×4): 15 mL via ORAL
  Filled 2016-04-11 (×4): qty 15

## 2016-04-11 NOTE — Progress Notes (Signed)
Subjective/Complaints: Slept well. Slow to awaken this morning.   ROS: pt denies nausea, vomiting, diarrhea, cough, shortness of breath or chest pain     Objective: Vital Signs: Blood pressure 128/87, pulse 74, temperature 98.4 F (36.9 C), temperature source Oral, resp. rate 18, height 5\' 5"  (1.651 m), weight 60.5 kg (133 lb 6.4 oz), SpO2 100 %. No results found. No results found for this or any previous visit (from the past 72 hour(s)).    General: NAD.  Psych: very flat  Heart: RRR Lungs: CTA bilaterally Abdomen: Positive bowel sounds, soft  Skin: No evidence of breakdown, no evidence of rash Neurologic:  Motor: 4/5 in R deltoid, bicep, tricep, grip, Bilateral hip flexor, knee extensors, ankle dorsiflexor and plantar flexor.  3+ to 4-/5 in left delt biceps triceps and grip. Alert Musculoskeletal: No edema. No tenderness.   Assessment/Plan: 1. 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 Physiatrist is providing close team supervision and 24 hour management of active medical problems listed below. Physiatrist and rehab team continue to assess barriers to discharge/monitor patient progress toward functional and medical goals. FIM: Function - Bathing Position: Shower Body parts bathed by patient: Left arm, Chest, Abdomen, Front perineal area, Buttocks, Right upper leg, Left upper leg, Right lower leg, Left lower leg Body parts bathed by helper: Back, Right arm Assist Level: More than reasonable time  Function- Upper Body Dressing/Undressing What is the patient wearing?: Pull over shirt/dress Pull over shirt/dress - Perfomed by patient: Put head through opening Pull over shirt/dress - Perfomed by helper: Thread/unthread right sleeve, Thread/unthread left sleeve, Pull shirt over trunk Assist Level:  (Max A) Function - Lower Body  Dressing/Undressing What is the patient wearing?: Pants, Non-skid slipper socks Position: Wheelchair/chair at sink Pants- Performed by patient: Thread/unthread right pants leg, Thread/unthread left pants leg, Pull pants up/down Pants- Performed by helper: Thread/unthread right pants leg Non-skid slipper socks- Performed by patient: Don/doff right sock Non-skid slipper socks- Performed by helper: Don/doff right sock, Don/doff left sock Assist for lower body dressing: Touching or steadying assistance (Pt > 75%)  Function - Toileting Toileting activity did not occur: No continent bowel/bladder event Toileting steps completed by patient: Adjust clothing prior to toileting Toileting steps completed by helper: Performs perineal hygiene, Adjust clothing after toileting Toileting Assistive Devices: Grab bar or rail Assist level: Touching or steadying assistance (Pt.75%)  Function - ArchivistToilet Transfers Toilet transfer activity did not occur: Safety/medical concerns Toilet transfer assistive device: Grab bar Assist level to toilet: Supervision or verbal cues Assist level from toilet: Supervision or verbal cues  Function - Chair/bed transfer Chair/bed transfer method: Ambulatory Chair/bed transfer assist level: Touching or steadying assistance (Pt > 75%) Chair/bed transfer assistive device: Armrests Chair/bed transfer details: Verbal cues for precautions/safety, Verbal cues for technique  Function - Locomotion: Wheelchair Type: Manual Max wheelchair distance: 50 Assist Level: Moderate assistance (Pt 50 - 74%) Assist Level: Moderate assistance (Pt 50 - 74%) Turns around,maneuvers to table,bed, and toilet,negotiates 3% grade,maneuvers on rugs and over doorsills: No Function - Locomotion: Ambulation Assistive device: Hand held assist Max distance: [id number] Assist level: Touching or steadying assistance (Pt > 75%) Assist level: Touching or steadying assistance (Pt > 75%) Assist level: Touching or  steadying assistance (Pt > 75%) Assist level: Touching or steadying assistance (Pt > 75%) Assist level: Touching or steadying assistance (Pt > 75%)  Function - Comprehension Comprehension: Auditory Comprehension  assist level: Follows basic conversation/direction with no assist  Function - Expression Expression: Verbal Expression assist level: Expresses basic needs/ideas: With no assist  Function - Social Interaction Social Interaction assist level: Interacts appropriately with others - No medications needed.  Function - Problem Solving Problem solving assist level: Solves basic 50 - 74% of the time/requires cueing 25 - 49% of the time  Function - Memory Memory assist level: Recognizes or recalls 25 - 49% of the time/requires cueing 50 - 75% of the time Patient normally able to recall (first 3 days only): Current season, Location of own room, Staff names and faces, That he or she is in a hospital  Medical Problem List and Plan: 1.  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  Continue PT, OT, SLP as tolerated 2.  DVT Prophylaxis/Anticoagulation: Pharmaceutical: Lovenox 3. Pain Management: Tylenol prn 4. Mood:  LCSW to follow for evaluation and support.  5. Neuropsych: This patient is not fully capable of making decisions on his own behalf. 6. Skin/Wound Care: routine pressure relief measures.  7. Fluids/Electrolytes/Nutrition: Monitor I/O.   BMP WNL on 12/26---recheck Monday along with prealbumin  -eating next to nothing  -initated megace 12/30  -consulted RD for recs  -upgrade diet? 8. CAD: Off ASA, will inquire about resuming.  Continue metoprolol.   BP within reasonable range at present  continue statin.  9. PEA: Felt to be due to respiratory event, overdose may have been initiating event with aspiration PNA---cardiology has signed off.  10. HTN: Monitor BP bid. Titrate medications as indicated.   Cont Norvasc 10  Cont  metoprolol 100 BID  Con aldactone 25  Hydralizine increased to 75 on 12/23   Controlled at present 11. COPD with ongoing tobacco abuse: Respiratory status stable.  12. Dysphagia:  Continue dysphagia 2, thin liquids with full supervision for safety.  13. Leucocytosis: resolved WBC 6.0 on 12/21 14.  Hx Polysub abuse- neuropsych as appropriate 15. ABLA  Hb 12.6 on 12/26     LOS (Days) 11 A FACE TO FACE EVALUATION WAS PERFORMED  Randall Patel 04/11/2016, 8:51 AM

## 2016-04-12 DIAGNOSIS — F10230 Alcohol dependence with withdrawal, uncomplicated: Secondary | ICD-10-CM

## 2016-04-12 DIAGNOSIS — E876 Hypokalemia: Secondary | ICD-10-CM

## 2016-04-12 MED ORDER — CHLORDIAZEPOXIDE HCL 5 MG PO CAPS
5.0000 mg | ORAL_CAPSULE | Freq: Three times a day (TID) | ORAL | Status: DC | PRN
  Administered 2016-04-12 – 2016-04-14 (×5): 5 mg via ORAL
  Filled 2016-04-12 (×5): qty 1

## 2016-04-12 MED ORDER — POTASSIUM CHLORIDE CRYS ER 10 MEQ PO TBCR
10.0000 meq | EXTENDED_RELEASE_TABLET | Freq: Every day | ORAL | Status: DC
  Filled 2016-04-12: qty 1

## 2016-04-12 MED ORDER — POTASSIUM CHLORIDE 20 MEQ/15ML (10%) PO SOLN
10.0000 meq | Freq: Every day | ORAL | Status: DC
  Administered 2016-04-12 – 2016-04-14 (×3): 10 meq via ORAL
  Filled 2016-04-12 (×3): qty 15

## 2016-04-12 NOTE — Progress Notes (Signed)
Subjective/Complaints: Complaints of tremor  Head, as well as both upper and lower limbs. States it started a couple days ago. Denies family history of tremor. Prior history of alcohol abuse, last drink approximately 3 weeks ago  Reviewed medications   ROS: pt denies nausea, vomiting, diarrhea, cough, shortness of breath or chest pain     Objective: Vital Signs: Blood pressure 113/75, pulse 76, temperature 98.5 F (36.9 C), temperature source Oral, resp. rate 18, height '5\' 5"'  (1.651 m), weight 60.5 kg (133 lb 6.4 oz), SpO2 99 %. No results found. Results for orders placed or performed during the hospital encounter of 03/31/16 (from the past 72 hour(s))  Prealbumin     Status: None   Collection Time: 04/11/16  7:32 AM  Result Value Ref Range   Prealbumin 31.4 18 - 38 mg/dL  Basic metabolic panel     Status: Abnormal   Collection Time: 04/11/16  7:32 AM  Result Value Ref Range   Sodium 136 135 - 145 mmol/L   Potassium 3.4 (L) 3.5 - 5.1 mmol/L   Chloride 103 101 - 111 mmol/L   CO2 23 22 - 32 mmol/L   Glucose, Bld 79 65 - 99 mg/dL   BUN 8 6 - 20 mg/dL   Creatinine, Ser 0.92 0.61 - 1.24 mg/dL   Calcium 9.9 8.9 - 10.3 mg/dL   GFR calc non Af Amer >60 >60 mL/min   GFR calc Af Amer >60 >60 mL/min    Comment: (NOTE) The eGFR has been calculated using the CKD EPI equation. This calculation has not been validated in all clinical situations. eGFR's persistently <60 mL/min signify possible Chronic Kidney Disease.    Anion gap 10 5 - 15      General: NAD.  Psych: very flat  Heart: RRR Lungs: CTA bilaterally Abdomen: Positive bowel sounds, soft  Skin: No evidence of breakdown, no evidence of rash Neurologic:  Motor: 4/5 in R deltoid, bicep, tricep, grip, Bilateral hip flexor, knee extensors, ankle dorsiflexor and plantar flexor.  3+ to 4-/5 in left delt biceps triceps and grip. Alert Musculoskeletal: No edema. No tenderness.  5-10 Hz tremor bilateral upper extremities as well as  head  Assessment/Plan: 1. 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 Physiatrist is providing close team supervision and 24 hour management of active medical problems listed below. Physiatrist and rehab team continue to assess barriers to discharge/monitor patient progress toward functional and medical goals. FIM: Function - Bathing Position: Shower Body parts bathed by patient: Left arm, Chest, Abdomen, Front perineal area, Buttocks, Right upper leg, Left upper leg, Right lower leg, Left lower leg Body parts bathed by helper: Back, Right arm Assist Level: More than reasonable time  Function- Upper Body Dressing/Undressing What is the patient wearing?: Pull over shirt/dress Pull over shirt/dress - Perfomed by patient: Put head through opening Pull over shirt/dress - Perfomed by helper: Thread/unthread right sleeve, Thread/unthread left sleeve, Pull shirt over trunk Assist Level:  (Max A) Function - Lower Body Dressing/Undressing What is the patient wearing?: Pants, Non-skid slipper socks Position: Wheelchair/chair at sink Pants- Performed by patient: Thread/unthread right pants leg, Thread/unthread left pants leg, Pull pants up/down Pants- Performed by helper: Pull pants up/down Non-skid slipper socks- Performed by patient: Don/doff right sock Non-skid slipper socks- Performed by helper: Don/doff right sock, Don/doff left sock Assist for lower body dressing: Touching or steadying assistance (Pt >  75%)  Function - Toileting Toileting activity did not occur: No continent bowel/bladder event Toileting steps completed by patient: Adjust clothing prior to toileting Toileting steps completed by helper: Performs perineal hygiene, Adjust clothing after toileting Toileting Assistive Devices: Grab bar or rail Assist level: Touching or steadying assistance  (Pt.75%)  Function - Air cabin crew transfer activity did not occur: Safety/medical concerns Toilet transfer assistive device: Grab bar Assist level to toilet: Touching or steadying assistance (Pt > 75%) Assist level from toilet: Supervision or verbal cues  Function - Chair/bed transfer Chair/bed transfer method: Ambulatory Chair/bed transfer assist level: Touching or steadying assistance (Pt > 75%) Chair/bed transfer assistive device: Armrests Chair/bed transfer details: Verbal cues for precautions/safety, Verbal cues for technique  Function - Locomotion: Wheelchair Type: Manual Max wheelchair distance: 50 Assist Level: Moderate assistance (Pt 50 - 74%) Assist Level: Moderate assistance (Pt 50 - 74%) Turns around,maneuvers to table,bed, and toilet,negotiates 3% grade,maneuvers on rugs and over doorsills: No Function - Locomotion: Ambulation Assistive device: Hand held assist Max distance: 146f Assist level: Touching or steadying assistance (Pt > 75%) Assist level: Touching or steadying assistance (Pt > 75%) Assist level: Touching or steadying assistance (Pt > 75%) Assist level: Touching or steadying assistance (Pt > 75%) Assist level: Touching or steadying assistance (Pt > 75%)  Function - Comprehension Comprehension: Auditory Comprehension assist level: Understands basic 75 - 89% of the time/ requires cueing 10 - 24% of the time  Function - Expression Expression: Verbal Expression assist level: Expresses basic needs/ideas: With extra time/assistive device  Function - Social Interaction Social Interaction assist level: Interacts appropriately with others - No medications needed.  Function - Problem Solving Problem solving assist level: Solves basic 50 - 74% of the time/requires cueing 25 - 49% of the time  Function - Memory Memory assist level: Recognizes or recalls 25 - 49% of the time/requires cueing 50 - 75% of the time Patient normally able to recall (first  3 days only): Current season, Location of own room, Staff names and faces, That he or she is in a hospital  Medical Problem List and Plan: 1.  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  Continue PT, OT, SLP as tolerated 2.  DVT Prophylaxis/Anticoagulation: Pharmaceutical: Lovenox 3. Pain Management: Tylenol prn 4. Mood:  LCSW to follow for evaluation and support.  5. Neuropsych: This patient is not fully capable of making decisions on his own behalf. 6. Skin/Wound Care: routine pressure relief measures.  7. Fluids/Electrolytes/Nutrition: Monitor I/O.   BMP WNL on 1/1--except mild hypokalemia, we will supplement   -eating poorlynitated megace 12/30, pre-albumin, however, is normal   -consulted RD for recs  -upgrade diet? 8. CAD: Off ASA, will inquire about resuming.  Continue metoprolol.   BP within reasonable range at present  continue statin.  9. PEA: Felt to be due to respiratory event, overdose may have been initiating event with aspiration PNA---cardiology has signed off.  10. HTN: Monitor BP bid. Titrate medications as indicated.   Cont Norvasc 10  Cont metoprolol 100 BID  Con aldactone 25  Hydralizine increased to 75 on 12/23   Controlled at present 11. COPD with ongoing tobacco abuse: Respiratory status stable.  12. Dysphagia:  Continue dysphagia 2, thin liquids with full supervision for safety.  13. Leucocytosis: resolved WBC 6.0 on 12/21 14.  Hx Polysub abuse- neuropsych as appropriate, alcohol withdrawal, tremors, low dose Librium  15. ABLA  Hb 12.6 on 12/26  LOS (Days) 12 A FACE TO FACE EVALUATION WAS PERFORMED  Oliwia Berzins E 04/12/2016, 10:10 AM

## 2016-04-13 ENCOUNTER — Inpatient Hospital Stay (HOSPITAL_COMMUNITY): Payer: Medicare HMO | Admitting: Speech Pathology

## 2016-04-13 ENCOUNTER — Inpatient Hospital Stay (HOSPITAL_COMMUNITY): Payer: Medicare HMO | Admitting: Physical Therapy

## 2016-04-13 ENCOUNTER — Inpatient Hospital Stay (HOSPITAL_COMMUNITY): Payer: Medicare HMO | Admitting: Occupational Therapy

## 2016-04-13 LAB — BASIC METABOLIC PANEL
ANION GAP: 12 (ref 5–15)
BUN: 9 mg/dL (ref 6–20)
CALCIUM: 10.4 mg/dL — AB (ref 8.9–10.3)
CO2: 22 mmol/L (ref 22–32)
Chloride: 105 mmol/L (ref 101–111)
Creatinine, Ser: 0.96 mg/dL (ref 0.61–1.24)
GLUCOSE: 116 mg/dL — AB (ref 65–99)
Potassium: 4 mmol/L (ref 3.5–5.1)
Sodium: 139 mmol/L (ref 135–145)

## 2016-04-13 LAB — CBC
HCT: 42.6 % (ref 39.0–52.0)
Hemoglobin: 13.8 g/dL (ref 13.0–17.0)
MCH: 27.4 pg (ref 26.0–34.0)
MCHC: 32.4 g/dL (ref 30.0–36.0)
MCV: 84.5 fL (ref 78.0–100.0)
PLATELETS: 336 10*3/uL (ref 150–400)
RBC: 5.04 MIL/uL (ref 4.22–5.81)
RDW: 14.9 % (ref 11.5–15.5)
WBC: 5.2 10*3/uL (ref 4.0–10.5)

## 2016-04-13 NOTE — Progress Notes (Signed)
Social Work Patient ID: Randall Patel, male   DOB: 1950-07-22, 66 y.o.   MRN: [id number]  Pt's sister and her friend were here for Speech therapy and educated about his diet which was upgraded to Dys 3 thin liquids. Sister informed worker pt goes to [employer] [facility] at the [facility]. Will contact tomorrow to schedule a follow up appointment due to closed today. Sister aware discharge is tomorrow and will be here To transport pt home in the am. Tub bench delivered to room today. Pt glad to be going home tomorrow.

## 2016-04-13 NOTE — Progress Notes (Signed)
Physical Therapy Session Note  Patient Details  Name: Randall Patel MRN: [id number] Date of Birth: 06/20/1950  Today's Date: 04/13/2016 PT Individual Time: 0807-0932 PT Individual Time Calculation (min): 85 min    Short Term Goals: Week 2:  PT Short Term Goal 1 (Week 2): = LTGs due to LOS  Skilled Therapeutic Interventions/Progress Updates:    Pt received sitting on EOB attempting to use yaunker but required assistance to turn it on. Pt denied c/o pain, only stated he was "tired", but agreeable to tx. Pt ambulated throughout unit with supervision assist, without AD, during session. Session focused on functional use of BUE as well as coordination tasks with pt performing cup stacking and catching/bouncing basketball, as well as UE strengthening exercises with 2 lbs weighted bar. Therapist provided multimodal cuing for proper technique for UE strengthening exercises. Pt negotiated 12 steps (6") with B rails and supervision; therapist provided min assist & cuing for LUE placement on rail. Pt reported need to use restroom and was able to path find back to room without cuing, complete toilet transfer with grab bar & supervision, and continent void. Pt required mod assist to perform hand washing as pt with impaired UE coordination. Pt utilized nu-step level 6 x 15 minutes with all 4 extremities focusing on coordination of reciprocal movements and endurance training. Pt completed car transfer at [facility] simulated height with supervision. Pt limited by significant tremors throughout session but RN aware & meds administered at beginning of session. At end of session pt left in bed with all needs within reach & bed alarm set.   Pt with difficulty managing secretions & required frequent use of washcloth to wipe mouth.  Therapy Documentation Precautions:  Precautions Precautions: Fall Precaution Comments: ataxia, motor planning deficits Restrictions Weight Bearing Restrictions: No   See Function Navigator  for Current Functional Status.   Therapy/Group: Individual Therapy  Sandi Mariscal 04/13/2016, 10:33 AM

## 2016-04-13 NOTE — Progress Notes (Signed)
Speech Language Pathology Discharge Summary  Patient Details  Name: Randall Patel MRN: [id number] Date of Birth: 08/24/1950  Today's Date: 04/13/2016 SLP Individual Time: 1500-1600 SLP Individual Time Calculation (min): 60 min  Skilled Therapeutic Interventions:   Skilled treatment session focused on addressing dysphagia goals and education. SLP facilitated session by providing set-up of Dys.3 textures and thin liquids with intermittent assist for use of safe swallow strategies to prevent overt s/s of aspiration.  Sister present for session to observed and SLP verbally educated her and patient on Dys.3 diet restrictions and provided a handout for carryover.  Recommend patient initiate a diet upgrade to Dys.3 textures and discharge home with 24/7 Supervision as well as follow up home health SLP services.     Patient has met 2 of 3 long term goals.  Patient to discharge at overall Modified Independent;Supervision level.  Reasons goals not met: continues to require Supervision with Dys.3 textures and thin lqiuids    Clinical Impression/Discharge Summary:    Patient has made functional gains during this rehab admission and has met  2 out of 3 long term goals due to improved functional abilities.  Patient is currently overall Mod I for participation in dysphagia therapy and has demonstrated improvements in oral phase of swallow.  He continues to require Supervision assist for utilization of swallowing compensatory strategies to minimize overt s/s of aspiration with Dys.3 textures and thin liquids. Patient and family education has been completed and patient will discharge home with 24 hour supervision. Patient would benefit from follow up SLP services to continue efforts to maximize swallow function and to further reduce the burden of care.   Care Partner:  Caregiver Able to Provide Assistance: Yes  Type of Caregiver Assistance:  (Swallow set-up and Supervision for safety)  Recommendation:  24 hour  supervision/assistance;Home Health SLP  Rationale for SLP Follow Up: Maximize swallowing safety;Reduce caregiver burden   Equipment: None   Reasons for discharge: Treatment goals met;Discharged from hospital   Patient/Family Agrees with Progress Made and Goals Achieved: Yes   Function:  Eating Eating   Modified Consistency Diet: Yes Eating Assist Level: Set up assist for;Supervision or verbal cues;Helper scoops food on utensil;Helper brings food to mouth   Eating Set Up Assist For: Opening containers Helper [place] on Utensil: Occasionally Helper [place] to Mouth: Occasionally   Cognition Comprehension Comprehension assist level: Follows basic conversation/direction with extra time/assistive device  Expression   Expression assist level: Expresses basic 75 - 89% of the time/requires cueing 10 - 24% of the time. Needs helper to occlude trach/needs to repeat words.  Social Interaction Social Interaction assist level: Interacts appropriately 90% of the time - Needs monitoring or encouragement for participation or interaction.  Problem Solving Problem solving assist level: Solves basic 90% of the time/requires cueing < 10% of the time  Memory Memory assist level: Recognizes or recalls 75 - 89% of the time/requires cueing 10 - 24% of the time   Carmelia Roller., [place]  York Valliant 04/13/2016, 4:33 PM

## 2016-04-13 NOTE — Progress Notes (Signed)
Subjective/Complaints: Complaints of tremor  Head, as well as both upper and lower limbs.  Discussed with PT who states it has worsened since last week but ambulation remains at Sup  ROS: pt denies nausea, vomiting, diarrhea, cough, shortness of breath or chest pain     Objective: Vital Signs: Blood pressure 128/77, pulse 77, temperature 98.6 F (37 C), temperature source Oral, resp. rate 16, height '5\' 5"'  (1.651 m), weight 60.5 kg (133 lb 6.4 oz), SpO2 100 %. No results found. Results for orders placed or performed during the hospital encounter of 03/31/16 (from the past 72 hour(s))  Prealbumin     Status: None   Collection Time: 04/11/16  7:32 AM  Result Value Ref Range   Prealbumin 31.4 18 - 38 mg/dL  Basic metabolic panel     Status: Abnormal   Collection Time: 04/11/16  7:32 AM  Result Value Ref Range   Sodium 136 135 - 145 mmol/L   Potassium 3.4 (L) 3.5 - 5.1 mmol/L   Chloride 103 101 - 111 mmol/L   CO2 23 22 - 32 mmol/L   Glucose, Bld 79 65 - 99 mg/dL   BUN 8 6 - 20 mg/dL   Creatinine, Ser 0.92 0.61 - 1.24 mg/dL   Calcium 9.9 8.9 - 10.3 mg/dL   GFR calc non Af Amer >60 >60 mL/min   GFR calc Af Amer >60 >60 mL/min    Comment: (NOTE) The eGFR has been calculated using the CKD EPI equation. This calculation has not been validated in all clinical situations. eGFR's persistently <60 mL/min signify possible Chronic Kidney Disease.    Anion gap 10 5 - 15      General: NAD.  Psych: very flat  Heart: RRR Lungs: CTA bilaterally Abdomen: Positive bowel sounds, soft  Skin: No evidence of breakdown, no evidence of rash Neurologic:  Motor: 4/5 in R deltoid, bicep, tricep, grip, Bilateral hip flexor, knee extensors, ankle dorsiflexor and plantar flexor.  3+ to 4-/5 in left delt biceps triceps and grip. Alert Musculoskeletal: No edema. No tenderness.  5-10 Hz tremor bilateral upper extremities as well as head , able to perform FNF Assessment/Plan: 1. 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 Physiatrist is providing close team supervision and 24 hour management of active medical problems listed below. Physiatrist and rehab team continue to assess barriers to discharge/monitor patient progress toward functional and medical goals. FIM: Function - Bathing Position: Shower Body parts bathed by patient: Left arm, Chest, Abdomen, Front perineal area, Buttocks, Right upper leg, Left upper leg, Right lower leg, Left lower leg Body parts bathed by helper: Back, Right arm Assist Level: More than reasonable time  Function- Upper Body Dressing/Undressing What is the patient wearing?: Pull over shirt/dress Pull over shirt/dress - Perfomed by patient: Put head through opening Pull over shirt/dress - Perfomed by helper: Thread/unthread right sleeve, Thread/unthread left sleeve, Pull shirt over trunk Assist Level:  (Max A) Function - Lower Body Dressing/Undressing What is the patient wearing?: Pants, Non-skid slipper socks Position: Wheelchair/chair at sink Pants- Performed by patient: Thread/unthread right pants leg, Thread/unthread left pants leg, Pull pants up/down Pants- Performed by helper: Pull pants up/down Non-skid slipper socks- Performed by patient: Don/doff right sock Non-skid slipper socks- Performed by helper: Don/doff right sock, Don/doff left sock Assist for lower body dressing: Touching or steadying assistance (Pt > 75%)  Function - Toileting Toileting  activity did not occur: No continent bowel/bladder event Toileting steps completed by patient: Adjust clothing prior to toileting Toileting steps completed by helper: Adjust clothing prior to toileting, Performs perineal hygiene, Adjust clothing after toileting Toileting Assistive Devices: Grab bar or rail Assist level: Touching or steadying assistance  (Pt.75%)  Function - Air cabin crew transfer activity did not occur: Safety/medical concerns Toilet transfer assistive device: Grab bar Assist level to toilet: Touching or steadying assistance (Pt > 75%) Assist level from toilet: Touching or steadying assistance (Pt > 75%)  Function - Chair/bed transfer Chair/bed transfer method: Ambulatory Chair/bed transfer assist level: Touching or steadying assistance (Pt > 75%) Chair/bed transfer assistive device: Armrests Chair/bed transfer details: Verbal cues for precautions/safety, Verbal cues for technique  Function - Locomotion: Wheelchair Type: Manual Max wheelchair distance: 50 Assist Level: Moderate assistance (Pt 50 - 74%) Assist Level: Moderate assistance (Pt 50 - 74%) Turns around,maneuvers to table,bed, and toilet,negotiates 3% grade,maneuvers on rugs and over doorsills: No Function - Locomotion: Ambulation Assistive device: Hand held assist Max distance: 122f Assist level: Touching or steadying assistance (Pt > 75%) Assist level: Touching or steadying assistance (Pt > 75%) Assist level: Touching or steadying assistance (Pt > 75%) Assist level: Touching or steadying assistance (Pt > 75%) Assist level: Touching or steadying assistance (Pt > 75%)  Function - Comprehension Comprehension: Auditory Comprehension assist level: Understands basic 75 - 89% of the time/ requires cueing 10 - 24% of the time  Function - Expression Expression: Verbal Expression assist level: Expresses basic needs/ideas: With no assist  Function - Social Interaction Social Interaction assist level: Interacts appropriately with others - No medications needed.  Function - Problem Solving Problem solving assist level: Solves basic 50 - 74% of the time/requires cueing 25 - 49% of the time  Function - Memory Memory assist level: Recognizes or recalls 50 - 74% of the time/requires cueing 25 - 49% of the time Patient normally able to recall (first  3 days only): Current season, Location of own room, Staff names and faces, That he or she is in a hospital  Medical Problem List and Plan: 1.  Poor safety awareness, cognitive deficits, weakness secondary to anoxic BI with hx of CVA.Also with R post frontal hematoma  Team conf in am 2.  DVT Prophylaxis/Anticoagulation: Pharmaceutical: Lovenox 3. Pain Management: Tylenol prn 4. Mood:  LCSW to follow for evaluation and support.  5. Neuropsych: This patient is not fully capable of making decisions on his own behalf. 6. Skin/Wound Care: routine pressure relief measures.  7. Fluids/Electrolytes/Nutrition: Monitor I/O.   BMP WNL on 1/1--except mild hypokalemia, cont KCL -eating poorlynitated megace 12/30, pre-albumin, however, is normal   -consulted RD for recs  -upgrade diet? 8. CAD: Off ASA, Neuro input on ASA  Continue metoprolol.   BP within reasonable range at present  continue statin.  9. PEA: Felt to be due to respiratory event, overdose may have been initiating event with aspiration PNA---cardiology has signed off.  10. HTN: Monitor BP bid. Titrate medications as indicated.   Cont Norvasc 10  Cont metoprolol 100 BID  Con aldactone 25  Hydralazine increased to 716mon 12/23   Controlled at present 11. COPD with ongoing tobacco abuse: Respiratory status stable.  12. Dysphagia:  Continue dysphagia 2, thin liquids with full supervision for safety.  13. Leucocytosis: resolved WBC 6.0 on 12/21 14.  Hx Polysub abuse- neuropsych as appropriate, alcohol withdrawal, tremors, low dose Librium, will schedule  15. ABLA  Hb 12.6 on 12/26,  no need for repeat unless some clinical change     LOS (Days) 13 A FACE TO FACE EVALUATION WAS PERFORMED  Randall Patel E 04/13/2016, 8:44 AM

## 2016-04-13 NOTE — Progress Notes (Signed)
Occupational Therapy Session Note  Patient Details  Name: Randall Patel MRN: [id number] Date of Birth: 01/19/1951  Today's Date: 04/13/2016 OT Individual Time: 0935-1030 OT Individual Time Calculation (min): 55 min     Short Term Goals:Week 2:  OT Short Term Goal 1 (Week 2): Pt will donn pullover shirt with min assist.  OT Short Term Goal 2 (Week 2): Pt will complete LB bathing sit to stand with supervision.  OT Short Term Goal 3 (Week 2): Pt will donn pullover shirt with supervision. OT Short Term Goal 4 (Week 2): Pt will use the LUE at a gross assist level for selfcare tasks with supervision OT Short Term Goal 5 (Week 2): Pt will complete shower transfers with supervision to the tub/shower bench.    Skilled Therapeutic Interventions/Progress Updates:    Pt seen for ADL retraining with a focus on use of BUE and dynamic balance, along with initiation and problem solving. Pt received in bed and agreeable to a shower. He is able to stand with S and ambulate in his room with light touching A. He does well with bathroom transfers but does need cues to step all the way back to the sitting surface. He attempted to sit in the recliner with his L foot about 1.5 feet forward which could have led to a fall.  Pt with increased tremors and ataxia in BUE. He had great difficulty managing the toothbrush, maintaining grasp on wash cloth, maintaining grasp on socks to pull them up. Today he could not thoroughly reach to wash under his axilla well.  He continues to need to be fed as he has great difficulty managing the silverware.  Provided pt with heavy travel mug as he has trouble holding the styrofoam cups.  Pt has plans to go home tomorrow, but no family has been present for education. Social worker is aware and spoken with pt's sister who does not feel family education is necessary. Overall pt is needing a great deal of assist with self care although his mobility only requires occasional minimal A. Pt in bed  with bed alarm on.    Therapy Documentation Precautions:  Precautions Precautions: Fall Precaution Comments: ataxia, motor planning deficits Restrictions Weight Bearing Restrictions: No    Vital Signs: Therapy Vitals Temp: 98.6 F (37 C) Temp Source: Oral Pulse Rate: 77 Resp: 16 BP: 128/77 Patient Position (if appropriate): Lying Oxygen Therapy SpO2: 100 % O2 Device: Not Delivered Pain: no c/o pain   ADL:   See Function Navigator for Current Functional Status.   Therapy/Group: Individual Therapy  SAGUIER,JULIA 04/13/2016, 8:29 AM

## 2016-04-13 NOTE — Progress Notes (Signed)
Social Work Patient ID: Randall Patel, male   DOB: [birth year], 66 y.o.   MRN: [id number] Have tried to get pt's sister-caregiver to come in and attend therapies with pt but she feels she knows who to care for him. She provided care last year when he was here and he recovered enough to be mod/i again. Have reiterated to her he will need 24 hr supervision and more for eating and bathing and dressing tasks. Will have home  Health continue to work with him in the home setting. He has declined substance abuse resources and feels he can quit since he has the time he has been here in the hospital.

## 2016-04-13 NOTE — Progress Notes (Signed)
Recreational Therapy Session Note  Patient Details  Name: Joskar Mccutcheon MRN: [id number] Date of Birth: [birth year] Today's Date: 04/13/2016  Eval deferred as pt is scheduled for discharge tomorrow. Kuba Shepherd 04/13/2016, 3:33 PM

## 2016-04-14 MED ORDER — THIAMINE HCL 100 MG PO TABS: 100.0000 mg | ORAL_TABLET | Freq: Every day | ORAL | 0 refills | Status: AC

## 2016-04-14 MED ORDER — SPIRONOLACTONE 25 MG PO TABS: 25.0000 mg | ORAL_TABLET | Freq: Every day | ORAL | 0 refills | Status: DC

## 2016-04-14 MED ORDER — POTASSIUM CHLORIDE 20 MEQ/15ML (10%) PO SOLN: 10.0000 meq | Freq: Every day | ORAL | 0 refills | Status: DC

## 2016-04-14 MED ORDER — AMLODIPINE BESYLATE 10 MG PO TABS: 10.0000 mg | ORAL_TABLET | Freq: Every day | ORAL | 0 refills | Status: DC

## 2016-04-14 MED ORDER — FOLIC ACID 1 MG PO TABS: 1.0000 mg | ORAL_TABLET | Freq: Every day | ORAL | 0 refills | Status: DC

## 2016-04-14 MED ORDER — METOPROLOL TARTRATE 100 MG PO TABS: 100.0000 mg | ORAL_TABLET | Freq: Two times a day (BID) | ORAL | 0 refills | Status: DC

## 2016-04-14 MED ORDER — ATORVASTATIN CALCIUM 80 MG PO TABS: 80.0000 mg | ORAL_TABLET | Freq: Every day | ORAL | 0 refills | Status: DC

## 2016-04-14 MED ORDER — HYDRALAZINE HCL 50 MG PO TABS: 75.0000 mg | ORAL_TABLET | Freq: Three times a day (TID) | ORAL | 0 refills | Status: DC

## 2016-04-14 NOTE — Discharge Instructions (Signed)
Inpatient Rehab Discharge Instructions  Randall Patel Discharge date and time:    Activities/Precautions/ Functional Status: Activity: no lifting, driving, or strenuous exercise for till cleared by MD Diet: Soft foods. Take medications with pudding/applesauce. Needs supervision with meals for safety.   Wound Care: none needed   Functional status:  ___ No restrictions     ___ Walk up steps independently _X__ 24/7 supervision/assistance   ___ Walk up steps with assistance ___ Intermittent supervision/assistance  ___ Bathe/dress independently ___ Walk with walker     _X__ Bathe/dress with supervision.  ___ Walk Independently    ___ Shower independently ___ Walk with assistance    ___ Shower with assistance _X__ No alcohol     ___ Return to work/school ________  Special Instructions:    COMMUNITY REFERRALS UPON DISCHARGE:    Home Health:   PT, 10, AIDE, SP  Agency:[facility] Phone:[phone]   Date of last service:04/14/2016  Medical Equipment/Items Ordered:TUB BENCH  Agency/Supplier:[facility]   [phone] Other:PCS REFERRAL MADE DECLINED SUBSTANCE ABUSE RESOURCES FELT HAS NOT HAD IT FOR 4 WEEKS CAN DO WITHOUT IT  GENERAL COMMUNITY RESOURCES FOR PATIENT/FAMILY: Support Groups:BI SUPPORT GROUP SECOND TUESDAY @ 7:00 ON THE REHAB UNIT QUESTIONS CONTACT Amada Jupiter [phone]    My questions have been answered and I understand these instructions. I will adhere to these goals and the provided educational materials after my discharge from the hospital.  Patient/Caregiver Signature _______________________________ Date __________  Clinician Signature _______________________________________ Date __________  Please bring this form and your medication list with you to all your follow-up doctor's appointments.

## 2016-04-14 NOTE — Discharge Summary (Signed)
Physician Discharge Summary  Patient ID: Randall Patel MRN: [id number] DOB/AGE: [birth year] 66 y.o.  Admit date: 03/31/2016 Discharge date: 04/14/2016  Discharge Diagnoses:  Principal Problem:   Anoxic brain injury [facility]) Active Problems:   Cardiomyopathy, ischemic   Essential hypertension, malignant   Hemiparesis affecting left side as late effect of stroke (HCC)   Benign essential HTN   Pulseless electrical activity (HCC)   Discharged Condition:  Stable   Significant Diagnostic Studies: N/A   Labs:  Basic Metabolic Panel:  Recent Labs Lab 04/11/16 0732 04/13/16 1451  NA 136 139  K 3.4* 4.0  CL 103 105  CO2 23 22  GLUCOSE 79 116*  BUN 8 9  CREATININE 0.92 0.96  CALCIUM 9.9 10.4*    CBC:  Recent Labs Lab 04/13/16 1451  WBC 5.2  HGB 13.8  HCT 42.6  MCV 84.5  PLT 336    CBG: No results for input(s): GLUCAP in the last 168 hours.  Brief HPI:   Randall Patel is a 66 y.o. 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 EMS found patient unresponsive on the ground with blood sputum but with pulse. He went cardiac arrest--PEA treated with CPR x 6 minutes and epinephrine with ROSC. UDS positive for benzos and THC. MRI brain revealed subacute hematoma right frontal lobe--31 mm. Cardiology consulted and elevated cardiac enzymes felt to be due to demand ischemia in setting of cardiac arrest. 2 D echo with EF 45-50% with hypokinesis of inferior and inferoseptal myocardium and moderately increased pulmonary pressures. Cath precluded due to hematoma and cardiology recommended medical management. EEG showed suggestion of global anoxic injury and Dr. Pearlean Brownie felt that patient with asymptomatic ICH with incidental old finding from 03/2015.  Encephalopathy had resolved and acute pulmonary edema treated with diuresis. 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 CIR was recommended for follow up therapy.    Hospital Course: Randall Patel was admitted to rehab 03/31/2016 for inpatient therapies to consist of PT, ST and OT at least three hours five days a week. Past admission physiatrist, therapy team and rehab RN have worked together to provide customized collaborative inpatient rehab.  Blood pressures were monitored on bid basis and hydralazine was increased for tighter BP control. Follow up lytes revealed mild hypokalemia therefore  K dur was added for supplement.  Respiratory status has been stable and reactive leucocytosis has resolved.  Alcohol withdrawal tremors were treated with low dose librium.  Po intake was poor on dysphagia 2 diet and megace was added for appetite. His diet was advanced to dysphagia 3 textures due to improvement in oral phase swallow. His progress has been limited by moderate to severe BUE tremors, apraxia and cognitive deficits. He is currently at supervision to min assist level.  He will continue to receive follow up HHPT, HHOT, HHST HH aide and HHRN by [facility] after discharge.   Rehab course: During patient's stay in rehab weekly team conferences were held to monitor patient's progress, set goals and discuss barriers to discharge. At admission, patient required max assist for basic self care tasks and moderate assist for mod assist for mobility. His diet was down graded to dysphagia 2 for mild oropharyngeal dysphagia. His progress has been limited and goals were downgraded. He requires moderate assist for bathing and dressing as well as max assist for grooming and  eating tasks. He requires supervision for transfers and for ambulation. He requires supervision for utilization of safe swallow strategies and is tolerating dysphagia 3 diet without signs or symptoms of aspiration.  Family education was completed with sister regarding all aspects of care and safety.     Disposition: 01-Home or Self Care  Diet: Dysphagia 3, thin liquids.   Special Instructions: 1. Needs supervision with meals for safety.   Discharge Instructions    Ambulatory referral to Neurology    Complete by:  As directed    An appointment is requested in approximately: 3-4 weeks. Input on need for ASA.   Ambulatory referral to Physical Medicine Rehab    Complete by:  As directed    Transitional care 1-2 weeks      Allergies as of 04/14/2016      Reactions   Lisinopril Swelling, Other (See Comments)   angioedema      Medication List    STOP taking these medications   aspirin EC 325 MG tablet   carvedilol 25 MG tablet Commonly known as:  COREG   feeding supplement Liqd   guaiFENesin 100 MG/5ML Soln Commonly known as:  ROBITUSSIN   ipratropium-albuterol 0.5-2.5 (3) MG/3ML Soln Commonly known as:  DUONEB   levETIRAcetam 500 MG tablet Commonly known as:  KEPPRA   losartan 100 MG tablet Commonly known as:  COZAAR     TAKE these medications   amLODipine 10 MG tablet Commonly known as:  NORVASC Take 1 tablet (10 mg total) by mouth daily.   atorvastatin 80 MG tablet Commonly known as:  LIPITOR Take 1 tablet (80 mg total) by mouth daily at 6 PM.   fluticasone 50 MCG/ACT nasal spray Commonly known as:  FLONASE Place 2 sprays into both nostrils daily.   folic acid 1 MG tablet Commonly known as:  FOLVITE Take 1 tablet (1 mg total) by mouth daily.   hydrALAZINE 50 MG tablet Commonly known as:  APRESOLINE Take 1.5 tablets (75 mg total) by mouth 3 (three) times daily. What changed:  how much to take   metoprolol 100 MG tablet Commonly known as:  LOPRESSOR Take 1 tablet (100 mg total) by mouth 2 (two) times daily.   multivitamin with minerals Tabs tablet Take 1 tablet by mouth daily.   nitroGLYCERIN 0.4 MG SL tablet Commonly known as:  NITROSTAT Place 1 tablet (0.4 mg total) under the tongue every 5 (five) minutes as needed for chest pain (up to 3  doses).   potassium chloride 20 MEQ/15ML (10%) Soln Take 7.5 mLs (10 mEq total) by mouth daily. Start taking on:  04/15/2016   spironolactone 25 MG tablet Commonly known as:  ALDACTONE Take 1 tablet (25 mg total) by mouth daily.   thiamine 100 MG tablet Take 1 tablet (100 mg total) by mouth daily.      Follow-up Information    Erick Colace, MD Follow up.   Specialty:  Physical Medicine and Rehabilitation Why:  office will call with follow up appointment Contact information: [address] [place] [address] [phone]        Verne Carrow, MD. Call.   Specialty:  Cardiology Why:  for follow up appointment in 1-2 weeks.  Contact information: [address] [place] [address] [phone]        EDWARDS, Kinnie Scales, NP Follow up on 04/26/2016.   Specialty:  Internal Medicine Why:  APPOINTMENT @ 2:15 PM Contact information: [address] [place]  [address] [phone]        SETHI,PRAMOD, MD. Call in 1 day(s).   Specialties:  Neurology, Radiology Why:  for follow up in 3-4 weeks and input regarding resuming ASA. Contact information: [address] [place] [address] [phone]           Signed: Jacquelynn Cree 04/14/2016, 5:45 PM

## 2016-04-14 NOTE — Progress Notes (Signed)
Called pt sister Shelda Pal, phone busy, tried back and got no answer.  Pt noted with difficulty swallowing pills, takes a long time to swallow one pill. Tried with and w/o applesauce with same results.  Crushed the rest of pt pills. Sister and another family member are here.

## 2016-04-14 NOTE — Progress Notes (Signed)
Social Work  Discharge Note  The overall goal for the admission was met for:   Discharge location: Yes-HOME WITH SISTER WHO CAN PROVIDE 24 HR SUPERVISION-CARE  Length of Stay: Yes-13 DAYS  Discharge activity level: Yes-SUPERVISION-MIN ASSIST  Home/community participation: Yes  Services provided included: MD, RD, PT, OT, SLP, RN, CM, TR, Pharmacy and SW  Financial Services: Medicaid and Private Insurance: [place]  Follow-up services arranged: Home Health: [facility]-PT,OT,RN,AIDE,SP, DME: [facility]-TUB [facility]ENCH and Patient/Family request agency HH: USED BEFORE, DME: USED BEFORE  Comments (or additional information):[name] WAS HERE YESTERDAY AND ATTENDED SPEECH THERAPY WITH PT, FEELS SHE CAN PROVIDE SUPERVISION WITH HIS PHYSICAL NEEDS, SINCE SHE DI LAST YEAR WHEN HE WAS HERE FOR A STROKE. PT DECLINES SUBSTANCE ABUSE RESOURCES DUE TO FEELS IS DONE AND HAS NOT HAD ANY FOR FOUR WEEKS NOW. AWARE OF THE ONES OUT IN THE COMMUNITY HAS BEEN REFERRED SEVERAL TIMES. PT DOES HAVE A PCP-MICHELLE EDWARDS-NP AT [facility] HAVE MADE A FOLLOW UP APPOINTMENT FOR 1/15 @ 2:15 PM  Patient/Family verbalized understanding of follow-up arrangements: Yes  Individual responsible for coordination of the follow-up plan: Randall Patel AND PT  Confirmed correct DME delivered: Randall Patel 04/14/2016    Randall Patel

## 2016-04-14 NOTE — Progress Notes (Signed)
Occupational Therapy Discharge Summary  Patient Details  Name: Randall Patel MRN: [id number] Date of Birth: 1950-10-17  Patient has met 1 of 12 long term goals. Pt did meet his memory goal of S.  He recalled his discharge date, plans, and therapists.  Patient to discharge at overall Mod Assist level.  Patient's care partner is independent to provide the necessary physical and cognitive assistance at discharge.    Reasons goals not met: Pt's goals were set at a modified independent level to S level. Unfortunately,  Pt has mod -severe tremors in BUE along with weakness.  Those deficits in combination with low activity tolerance, apraxia, cognitive deficits have resulted in him needing steadying A when he ambulates into the bathroom for toilet and tub transfers (vs. Only S), mod A with bathing and dressing, max A with grooming and eating.    Recommendation:  Patient will benefit from ongoing skilled OT services in home health setting to continue to advance functional skills in the area of BADL.  Equipment: transfer tub bench, weighted cup  Reasons for discharge: lack of progress toward goals  Patient/family agrees with progress made and goals achieved: Yes  OT Discharge Precautions/Restrictions  Precautions Precautions: Fall Precaution Comments: ataxia, motor planning deficits Restrictions Weight Bearing Restrictions: No  ADL ADL ADL Comments: refer to functional navigator -overall needs mod A for self care due to ataxia and apraxia Vision/Perception  Vision- History Baseline Vision/History: Glaucoma Patient Visual Report: Blurring of vision Vision- Assessment Eye Alignment: Within Functional Limits Alignment/Gaze Preference: Within Defined Limits Tracking/Visual Pursuits: Decreased smoothness of vertical tracking;Decreased smoothness of horizontal tracking;Other (comment) Saccades: Impaired - to be further tested in functional context Additional Comments: Pt states he is not  able to see anything clearly including faces. He stated that he is planning on getting glasses soon.  Cognition Overall Cognitive Status: Impaired/Different from baseline Orientation Level: Oriented X4 Memory: Impaired Memory Impairment: Storage deficit;Decreased recall of new information Awareness: Impaired Awareness Impairment: Anticipatory impairment Problem Solving: Impaired Problem Solving Impairment: Functional basic Sensation Sensation Stereognosis: Appears Intact Hot/Cold: Appears Intact Proprioception Impaired Details: Impaired RUE;Impaired RLE Coordination Gross Motor Movements are Fluid and Coordinated: No Fine Motor Movements are Fluid and Coordinated: No Coordination and Movement Description: Pt with Brunstrum stage IV-V movement in the LUE and decreased motor planning in the right for all tasks.  He could use the LUE for washing some but could not open soap or wring out washcloth even though full gross AROM is present.  Finger Nose Finger Test: ataxia Motor  Motor Motor: Hemiplegia;Motor apraxia Motor - Discharge Observations: Pt with RUE hemiparesis and severe LUE weakness. pt has great difficulty holding washcloths, cups, spoons. Mobility    steadying A with ambulation for bathroom transfers Trunk/Postural Assessment  Cervical Assessment Cervical Assessment: Within Functional Limits Thoracic Assessment Thoracic Assessment: Within Functional Limits Postural Control Righting Reactions: delayed  Balance Static Sitting Balance Static Sitting - Level of Assistance: 6: Modified independent (Device/Increase time) Dynamic Sitting Balance Dynamic Sitting - Level of Assistance: 5: Stand by assistance Static Standing Balance Static Standing - Level of Assistance: 5: Stand by assistance Dynamic Standing Balance Dynamic Standing - Level of Assistance: 4: Min assist Extremity/Trunk Assessment RUE Strength RUE Overall Strength Comments: Pt demonstrates 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   LUE Strength LUE Overall Strength Comments: Pt with full AROM during gross testing with strength 3+/5 but demonstrates  significant motor planning when attempting to use functionally during selfcare tasks. Also with ataxia.     See Function Navigator for Current Functional Status.  [place] 04/14/2016, 10:59 AM

## 2016-04-14 NOTE — Progress Notes (Signed)
Subjective/Complaints: tremor better on Librium Patient is aware of discharge ROS: pt denies nausea, vomiting, diarrhea, cough, shortness of breath or chest pain     Objective: Vital Signs: Blood pressure 130/84, pulse 71, temperature 98.5 F (36.9 C), temperature source Oral, resp. rate 18, height '5\' 5"'  (1.651 m), weight 61.3 kg (135 lb 2.3 oz), SpO2 100 %. No results found. Results for orders placed or performed during the hospital encounter of 03/31/16 (from the past 72 hour(s))  CBC     Status: None   Collection Time: 04/13/16  2:51 PM  Result Value Ref Range   WBC 5.2 4.0 - 10.5 K/uL   RBC 5.04 4.22 - 5.81 MIL/uL   Hemoglobin 13.8 13.0 - 17.0 g/dL   HCT 42.6 39.0 - 52.0 %   MCV 84.5 78.0 - 100.0 fL   MCH 27.4 26.0 - 34.0 pg   MCHC 32.4 30.0 - 36.0 g/dL   RDW 14.9 11.5 - 15.5 %   Platelets 336 150 - 400 K/uL  Basic metabolic panel     Status: Abnormal   Collection Time: 04/13/16  2:51 PM  Result Value Ref Range   Sodium 139 135 - 145 mmol/L   Potassium 4.0 3.5 - 5.1 mmol/L   Chloride 105 101 - 111 mmol/L   CO2 22 22 - 32 mmol/L   Glucose, Bld 116 (H) 65 - 99 mg/dL   BUN 9 6 - 20 mg/dL   Creatinine, Ser 0.96 0.61 - 1.24 mg/dL   Calcium 10.4 (H) 8.9 - 10.3 mg/dL   GFR calc non Af Amer >60 >60 mL/min   GFR calc Af Amer >60 >60 mL/min    Comment: (NOTE) The eGFR has been calculated using the CKD EPI equation. This calculation has not been validated in all clinical situations. eGFR's persistently <60 mL/min signify possible Chronic Kidney Disease.    Anion gap 12 5 - 15      General: NAD.  Psych: very flat  Heart: RRR Lungs: CTA bilaterally Abdomen: Positive bowel sounds, soft  Skin: No evidence of breakdown, no evidence of rash Neurologic:  Motor: 4/5 in R deltoid, bicep, tricep, grip, Bilateral hip flexor, knee extensors, ankle dorsiflexor and plantar flexor.  3+ to 4-/5 in left delt biceps triceps and grip. Alert Musculoskeletal: No edema. No tenderness.   5-10 Hz tremor bilateral upper extremities as well as head , able to perform FNF Assessment/Plan: 1. 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 Stable for D/C today F/u PCP in 3-4 weeks F/u PM&R 2 weeks See D/C summary See D/C instructions FIM: Function - Bathing Position: Shower Body parts bathed by patient: Chest, Front perineal area, Right upper leg, Left upper leg, Buttocks, Right lower leg, Left lower leg, Abdomen Body parts bathed by helper: Right arm, Left arm, Back Assist Level: More than reasonable time  Function- Upper Body Dressing/Undressing What is the patient wearing?: Pull over shirt/dress Pull over shirt/dress - Perfomed by patient: Put head through opening Pull over shirt/dress - Perfomed by helper: Thread/unthread right sleeve, Thread/unthread left sleeve, Pull shirt over trunk Assist Level:  (Max A) Function - Lower Body Dressing/Undressing What is the patient wearing?: Pants, Non-skid slipper socks Position: Wheelchair/chair at sink Pants- Performed by patient: Thread/unthread right pants leg, Thread/unthread left pants leg, Pull pants up/down Pants- Performed by helper: Pull pants up/down Non-skid slipper socks- Performed by patient: Don/doff right sock Non-skid slipper socks- Performed by helper: Don/doff right sock, Don/doff left sock Assist for lower  body dressing: Touching or steadying assistance (Pt > 75%)  Function - Toileting Toileting activity did not occur: No continent bowel/bladder event Toileting steps completed by patient: Adjust clothing prior to toileting, Adjust clothing after toileting Toileting steps completed by helper: Performs perineal hygiene Toileting Assistive Devices: Grab bar or rail Assist level: Supervision or verbal cues  Function - Air cabin crew transfer activity did not occur: Safety/medical concerns Toilet transfer assistive device: Grab bar Assist level to toilet: Supervision or  verbal cues Assist level from toilet: Supervision or verbal cues  Function - Chair/bed transfer Chair/bed transfer method: Ambulatory Chair/bed transfer assist level: Touching or steadying assistance (Pt > 75%) Chair/bed transfer assistive device: Armrests Chair/bed transfer details: Verbal cues for precautions/safety, Verbal cues for technique  Function - Locomotion: Wheelchair Type: Manual Max wheelchair distance: 50 Assist Level: Moderate assistance (Pt 50 - 74%) Assist Level: Moderate assistance (Pt 50 - 74%) Turns around,maneuvers to table,bed, and toilet,negotiates 3% grade,maneuvers on rugs and over doorsills: No Function - Locomotion: Ambulation Assistive device: No device Max distance: >150 ft Assist level: Supervision or verbal cues Assist level: Supervision or verbal cues Assist level: Supervision or verbal cues Assist level: Supervision or verbal cues Assist level: Touching or steadying assistance (Pt > 75%)  Function - Comprehension Comprehension: Auditory Comprehension assist level: Follows basic conversation/direction with extra time/assistive device  Function - Expression Expression: Verbal Expression assist level: Expresses basic 75 - 89% of the time/requires cueing 10 - 24% of the time. Needs helper to occlude trach/needs to repeat words.  Function - Social Interaction Social Interaction assist level: Interacts appropriately 90% of the time - Needs monitoring or encouragement for participation or interaction.  Function - Problem Solving Problem solving assist level: Solves basic 90% of the time/requires cueing < 10% of the time  Function - Memory Memory assist level: Recognizes or recalls 75 - 89% of the time/requires cueing 10 - 24% of the time Patient normally able to recall (first 3 days only): Current season, Location of own room, Staff names and faces, That he or she is in a hospital  Medical Problem List and Plan: 1.  Poor safety awareness, cognitive  deficits, weakness secondary to anoxic BI with hx of CVA.Also with R post frontal hematoma  Stable for D/C today-f/u [facility]  2.  DVT Prophylaxis/Anticoagulation: Pharmaceutical: Lovenox 3. Pain Management: Tylenol prn 4. Mood:  LCSW to follow for evaluation and support.  5. Neuropsych: This patient is not fully capable of making decisions on his own behalf. 6. Skin/Wound Care: routine pressure relief measures.  7. Fluids/Electrolytes/Nutrition: Monitor I/O.   BMP WNL on 1/1--except mild hypokalemia, cont KCL -eating poorlynitated megace 12/30, pre-albumin, however, is normal   -consulted RD for recs  -upgrade diet? 8. CAD: Off ASA, Neuro input on ASA  Continue metoprolol.   BP within reasonable range at present  continue statin.  9. PEA: Felt to be due to respiratory event, overdose may have been initiating event with aspiration PNA---cardiology has signed off.  10. HTN: Monitor BP bid. Titrate medications as indicated.   Cont Norvasc 10  Cont metoprolol 100 BID  Con aldactone 25  Hydralazine increased to 24m on 12/23   Controlled at present 11. COPD with ongoing tobacco abuse: Respiratory status stable.  12. Dysphagia:  Continue dysphagia 2, thin liquids with full supervision for safety.  13. Leucocytosis: resolved WBC 6.0 on 12/21 14.  Hx Polysub abuse- , alcohol withdrawal tremors, low dose Librium,PCP to make outpt substance abuse referral  15. ABLA  Hb 12.6 on 12/26, no need for repeat unless some clinical change     LOS (Days) 14 A FACE TO FACE EVALUATION WAS PERFORMED  Kailene Steinhart E 04/14/2016, 9:28 AM

## 2016-04-14 NOTE — Progress Notes (Addendum)
Physical Therapy Discharge Summary  Patient Details  Name: Randall Patel MRN: [id number] Date of Birth: 09-18-1950  Today's Date: 04/14/2016    Patient has met 9 of 12 long term goals due to improved activity tolerance, improved balance, improved postural control, increased strength and improved awareness. Pt continues to be limited by impaired cognition & severe tremors in BUE.  Patient to discharge at an ambulatory level Supervision.   Patient did not have any family/friends present for caregiver training. Pt requires 24 hr supervision upon d/c.  Reasons goals not met: impaired cognition  Recommendation:  Patient will benefit from ongoing skilled PT services in home health setting to continue to advance safe functional mobility, address ongoing impairments in decreased balance, strength, endurance, safety awareness, gait training, pt/caregiver education, and minimize fall risk.  Equipment: None required  Reasons for discharge: treatment goals met and discharge from hospital  Patient/family agrees with progress made and goals achieved: Yes  PT Discharge Precautions/Restrictions Precautions Precautions: Fall Precaution Comments: ataxia, motor planning deficits Restrictions Weight Bearing Restrictions: No  Cognition Orientation Level: Oriented to person  Motor  Motor Motor - Skilled Clinical Observations: general weakness, tremors   Mobility Bed Mobility Bed Mobility: Sit to Supine Sit to Supine: 5: Supervision;With rail Sit to Supine - Details: Verbal cues for technique Transfers Transfers: Yes Sit to Stand: 5: Supervision Stand to Sit: 5: Supervision   Locomotion  Ambulation Ambulation: Yes Ambulation/Gait Assistance: 5: Supervision Ambulation Distance (Feet): 150 Feet Assistive device: None Gait Gait: Yes Gait Pattern: Decreased step length - right;Decreased step length - left (decreased balance & gait speed) Stairs / Additional Locomotion Stairs: Yes Stairs  Assistance: 5: Supervision Stair Management Technique: Two rails Number of Stairs: 12 Height of Stairs: 6 (inches) Wheelchair Mobility Wheelchair Mobility: No   Balance Balance Balance Assessed: Yes Dynamic Standing Balance Dynamic Standing - Balance Support: No upper extremity supported Dynamic Standing - Level of Assistance: 5: Stand by assistance   Extremity Assessment  RLE Assessment RLE Assessment: Within Functional Limits LLE Assessment LLE Assessment: Within Functional Limits   See Function Navigator for Current Functional Status.  Randall Patel 04/14/2016, 10:06 PM

## 2016-04-14 NOTE — Plan of Care (Signed)
Problem: RH Floor Transfers Goal: LTG Patient will perform floor transfers w/assist (PT) LTG: Patient will perform floor transfers with assistance (PT).  Outcome: Not Met (add Reason) Not attempted   Problem: RH Ambulation Goal: LTG Patient will ambulate in controlled environment (PT) LTG: Patient will ambulate in a controlled environment, # of feet with assistance (PT).  Outcome: Completed/Met Date Met: 04/14/16 150 ft without AD Goal: LTG Patient will ambulate in home environment (PT) LTG: Patient will ambulate in home environment, # of feet with assistance (PT).  Outcome: Completed/Met Date Met: 04/14/16 50 ft without AD  Problem: RH Wheelchair Mobility Goal: LTG Patient will propel w/c in controlled environment (PT) LTG: Patient will propel wheelchair in controlled environment, # of feet with assist (PT)  Outcome: Not Met (add Reason) Pt to d/c at ambulatory level  Problem: RH Stairs Goal: LTG Patient will ambulate up and down stairs w/assist (PT) LTG: Patient will ambulate up and down # of stairs with assistance (PT)  Outcome: Completed/Met Date Met: 04/14/16 12 steps with B rails  Problem: RH Awareness Goal: LTG: Patient will demonstrate intellectual/emergent (PT) LTG: Patient will demonstrate intellectual/emergent/anticipatory awareness with assist during a mobility activity  (PT)  Outcome: Not Met (add Reason) 2/2 impaired cognition

## 2016-04-15 ENCOUNTER — Telehealth: Payer: Self-pay

## 2016-04-15 NOTE — Telephone Encounter (Signed)
Appointment time  January 1st 2018 1:45pm, arrive time 1:15pm With Dr. Wynn Banker   No Paperwork mailed  Transitional Care call- [name]    1. Are you/is patient experiencing any problems since coming home? No Are there any questions regarding any aspect of care? No 2. Are there any questions regarding medications administration/dosing? No Are meds being taken as prescribed? "Yes, with applesauce" Patient should review meds with caller to confirm 3. Have there been any falls? No 4. Has Home Health been to the house and/or have they contacted you?"Yes, someone called about coming out" If not, have you tried to contact them? No Can we help you contact them? No 5. Are bowels and bladder emptying properly? Yes Are there any unexpected incontinence issues? No  6. Any fevers, problems with breathing, unexpected pain? No 7. Are there any skin problems or new areas of breakdown? No 8. Has the patient/family member arranged specialty MD follow up (ie cardiology/neurology/renal/surgical/etc)? Yes Can we help arrange? No 9. Does the patient need any other services or support that we can help arrange? "Not at this time" 10. Are caregivers following through as expected in assisting the patient? Yes 11. Has the patient quit smoking, drinking alcohol, or using drugs as recommended? "Yes, patient has stop"

## 2016-04-16 ENCOUNTER — Telehealth: Payer: Self-pay | Admitting: Physical Medicine & Rehabilitation

## 2016-04-16 ENCOUNTER — Encounter: Payer: Medicare HMO | Admitting: Physical Medicine & Rehabilitation

## 2016-04-16 DIAGNOSIS — G931 Anoxic brain damage, not elsewhere classified: Secondary | ICD-10-CM

## 2016-04-16 NOTE — Telephone Encounter (Signed)
May have a hospital bed, obtained through [facility]

## 2016-04-16 NOTE — Telephone Encounter (Signed)
AK - male called on behalf of ptn 2 x's and left voicemail today stating patient needs hospital bed - he has fallen out of bed- please call back at [phone]

## 2016-04-19 NOTE — Telephone Encounter (Signed)
Order placed and attempted to reach the [place] residence.  No answer and no voicemail

## 2016-04-20 ENCOUNTER — Encounter: Payer: Medicare HMO | Attending: Physical Medicine & Rehabilitation

## 2016-04-20 ENCOUNTER — Encounter: Payer: Medicare HMO | Admitting: Physical Medicine & Rehabilitation

## 2016-04-22 NOTE — Telephone Encounter (Signed)
Third call to notify of bed ordered but no answer and no voicemail.  Encounter will be closed.

## 2016-04-25 ENCOUNTER — Emergency Department (HOSPITAL_COMMUNITY)
Admission: EM | Admit: 2016-04-25 | Discharge: 2016-04-26 | Disposition: A | Discharge status: Home / Self Care | Payer: Medicare HMO | Attending: Emergency Medicine | Admitting: Emergency Medicine

## 2016-04-25 ENCOUNTER — Encounter (HOSPITAL_COMMUNITY): Payer: Self-pay | Admitting: Emergency Medicine

## 2016-04-25 ENCOUNTER — Emergency Department (HOSPITAL_COMMUNITY): Payer: Medicare HMO

## 2016-04-25 DIAGNOSIS — Z8673 Personal history of transient ischemic attack (TIA), and cerebral infarction without residual deficits: Secondary | ICD-10-CM | POA: Insufficient documentation

## 2016-04-25 DIAGNOSIS — K5900 Constipation, unspecified: Secondary | ICD-10-CM | POA: Diagnosis not present

## 2016-04-25 DIAGNOSIS — J449 Chronic obstructive pulmonary disease, unspecified: Secondary | ICD-10-CM | POA: Diagnosis not present

## 2016-04-25 DIAGNOSIS — I10 Essential (primary) hypertension: Secondary | ICD-10-CM | POA: Diagnosis not present

## 2016-04-25 DIAGNOSIS — I252 Old myocardial infarction: Secondary | ICD-10-CM | POA: Diagnosis not present

## 2016-04-25 DIAGNOSIS — N400 Enlarged prostate without lower urinary tract symptoms: Secondary | ICD-10-CM | POA: Diagnosis not present

## 2016-04-25 DIAGNOSIS — I2510 Atherosclerotic heart disease of native coronary artery without angina pectoris: Secondary | ICD-10-CM | POA: Diagnosis not present

## 2016-04-25 DIAGNOSIS — Z955 Presence of coronary angioplasty implant and graft: Secondary | ICD-10-CM | POA: Insufficient documentation

## 2016-04-25 DIAGNOSIS — Z79899 Other long term (current) drug therapy: Secondary | ICD-10-CM | POA: Insufficient documentation

## 2016-04-25 DIAGNOSIS — F17210 Nicotine dependence, cigarettes, uncomplicated: Secondary | ICD-10-CM | POA: Diagnosis not present

## 2016-04-25 LAB — URINALYSIS, ROUTINE W REFLEX MICROSCOPIC
BILIRUBIN URINE: NEGATIVE
Glucose, UA: NEGATIVE mg/dL
Hgb urine dipstick: NEGATIVE
KETONES UR: 20 mg/dL — AB
Leukocytes, UA: NEGATIVE
NITRITE: NEGATIVE
Protein, ur: NEGATIVE mg/dL
SPECIFIC GRAVITY, URINE: 1.02 (ref 1.005–1.030)
pH: 5 (ref 5.0–8.0)

## 2016-04-25 LAB — POC OCCULT BLOOD, ED: FECAL OCCULT BLD: NEGATIVE

## 2016-04-25 NOTE — ED Notes (Signed)
Patient transported to X-ray 

## 2016-04-25 NOTE — ED Provider Notes (Signed)
[facility]-EMER[facility]ENCY DEPT Provider Note   CSN: [id number] Arrival date & time: 04/25/16  1811     History   Chief Complaint Chief Complaint  Patient presents with  . Urinary Retention  . Constipation    HPI Randall Patel is a 66 y.o. male.  HPI Patient reports that he has had both constipation and difficulty urinating. He reports he was constipated for 2 days but then he took milk of magnesia and he reports having 2 normal bowel movements today. He denies abdominal pain. He reports he was also having difficulty urinating but after he took milk of magnesia he also urinated this morning. He is denying dysuria or suprapubic pain. He reports he had discussed this with his nurse and she recommended he be seen and evaluated for possible prostate enlargement. Patient denies he's had fever or back pain. Past Medical History:  Diagnosis Date  . CAD (coronary artery disease)    a. prior MI in 2006 b. 03/2012: 30% ostial LM stenosis, 30% pLAD stenosis, 60% mid-LADm 30% pCx, 3rd OM which was small in size with diffuse 90% stenosis involving both branches, and RCA with 50% mid-stenosis and 100% distal occlusion. POBA to PL branch and DES placement to dRCA.  . Claudication (HCC)   . Coronary artery disease    MI in 2008 with PCI to ? vessel; NSTEMI 03/2012; NSTEMI 04/2012 Cath showed triple vessel dz including occluded RCA, s/p DES to distal RCA, PTCA posterolateral branch   . CVA (cerebral infarction)    03/2012 with left arm weakness/numbness and left facial droop.  [name] ETOH abuse    quit 04/06/12  . ETOH abuse   . History of stroke 03/2015   right frontal hemorrhage  . HTN (hypertension)   . Hyperlipidemia   . Hypertension   . Ischemic cardiomyopathy    04/2012 EF 35-40%  . Ischemic cardiomyopathy    a. EF 40-45% by echo in 03/2015  . Seizures (HCC)   . Stroke (HCC)   . Tobacco abuse    quit 04/06/12  . Tobacco abuse   . Withdrawal seizures [facility])     Patient Active Problem List   Diagnosis Date Noted  . Anoxic brain injury (HCC) 03/31/2016  . Pulseless electrical activity (HCC)   . Chronic obstructive pulmonary disease (HCC)   . Dysphagia, post-stroke   . Abnormal CT of brain   . Coronary artery disease involving native coronary artery of native heart without angina pectoris   . ETOH abuse   . Tachypnea   . Benign essential HTN   . Hyperglycemia   . Pain   . Leukocytosis   . Acute pulmonary edema (HCC)   . Respiratory failure (HCC)   . Sinus tachycardia   . Cardiac arrest (HCC) 03/23/2016  . Palpitations 08/15/2015  . History of stroke 08/15/2015  . Anterior cerebral circulation hemorrhagic infarction (HCC) 08/15/2015  . HLD (hyperlipidemia) 08/15/2015  . Muscle stiffness   . Cough   . Hemiparesis affecting left side as late effect of stroke (HCC)   . Dysphagia as late effect of cerebrovascular disease   . Essential hypertension   . Acute blood loss anemia   . 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 Dysphagia   . 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 Diastolic dysfunction   . Tobacco abuse   . ETOH abuse   . History of CVA with residual deficit   . Coronary artery disease involving native coronary  artery of native heart with angina pectoris (HCC)   . Tachypnea   . Hypernatremia   . Hypokalemia   . Thrombocytopenia (HCC)   . Intracranial hemorrhage (HCC)   . Essential hypertension, malignant   . ICH (intracerebral hemorrhage) (HCC) 04/04/2015  . STEMI (ST elevation myocardial infarction) (HCC) 04/14/2012  . NSTEMI (non-ST elevated myocardial infarction) (HCC) 04/14/2012  . Cardiomyopathy, ischemic 04/14/2012  . Cerebral embolism with cerebral infarction (HCC) 04/03/2012  . Unspecified transient cerebral ischemia 04/02/2012  . Hemiplegia, unspecified, affecting nondominant side 04/02/2012  . CAD (coronary artery disease) s/p stent 2008 in [place] 04/02/2012  . Hypertension     Past Surgical  History:  Procedure Laterality Date  . CORONARY ANGIOPLASTY WITH STENT PLACEMENT  03/2012  . HERNIA REPAIR    . LEFT HEART CATHETERIZATION WITH CORONARY ANGIOGRAM N/A 04/11/2012   Procedure: LEFT HEART CATHETERIZATION WITH CORONARY ANGIOGRAM;  Surgeon: Kathleene Hazel, MD;  Location: [facility] CATH LAB;  Service: Cardiovascular;  Laterality: N/A;  . PERCUTANEOUS CORONARY INTERVENTION-BALLOON ONLY  04/11/2012   Procedure: PERCUTANEOUS CORONARY INTERVENTION-BALLOON ONLY;  Surgeon: Kathleene Hazel, MD;  Location: [facility] CATH LAB;  Service: Cardiovascular;;  RPLA  . PERCUTANEOUS CORONARY STENT INTERVENTION (PCI-S)  04/11/2012   Procedure: PERCUTANEOUS CORONARY STENT INTERVENTION (PCI-S);  Surgeon: Kathleene Hazel, MD;  Location: [facility] CATH LAB;  Service: Cardiovascular;;  Distal RCA       Home Medications    Prior to Admission medications   Medication Sig Start Date End Date Taking? Authorizing Provider  amLODipine (NORVASC) 10 MG tablet Take 1 tablet (10 mg total) by mouth daily. 04/14/16   Jacquelynn Cree, PA-C  atorvastatin (LIPITOR) 80 MG tablet Take 1 tablet (80 mg total) by mouth daily at 6 PM. 04/14/16   Evlyn Kanner Love, PA-C  docusate sodium (COLACE) 100 MG capsule Take 1 capsule (100 mg total) by mouth every 12 (twelve) hours. 04/26/16   Arby Barrette, MD  fluticasone (FLONASE) 50 MCG/ACT nasal spray Place 2 sprays into both nostrils daily. 04/01/16   Kathlen Mody, MD  folic acid (FOLVITE) 1 MG tablet Take 1 tablet (1 mg total) by mouth daily. 04/14/16   Jacquelynn Cree, PA-C  hydrALAZINE (APRESOLINE) 50 MG tablet Take 1.5 tablets (75 mg total) by mouth 3 (three) times daily. 04/14/16   Jacquelynn Cree, PA-C  metoprolol (LOPRESSOR) 100 MG tablet Take 1 tablet (100 mg total) by mouth 2 (two) times daily. 04/14/16   Jacquelynn Cree, PA-C  Multiple Vitamin (MULTIVITAMIN WITH MINERALS) TABS tablet Take 1 tablet by mouth daily. 04/01/16   Kathlen Mody, MD  nitroGLYCERIN (NITROSTAT) 0.4 MG SL tablet  Place 1 tablet (0.4 mg total) under the tongue every 5 (five) minutes as needed for chest pain (up to 3 doses). 04/14/12   Jessica A Hope, PA-C  potassium chloride 20 MEQ/15ML (10%) SOLN Take 7.5 mLs (10 mEq total) by mouth daily. 04/15/16   Jacquelynn Cree, PA-C  spironolactone (ALDACTONE) 25 MG tablet Take 1 tablet (25 mg total) by mouth daily. 04/14/16   Jacquelynn Cree, PA-C  thiamine 100 MG tablet Take 1 tablet (100 mg total) by mouth daily. 04/14/16   Jacquelynn Cree, PA-C    Family History Family History  Problem Relation Age of Onset  . Heart attack Father   . Hypertension Father   . Cancer Mother   . Stroke Sister   . Hypertension Mother   . Hypertension Sister   . Hypertension Brother  Social History Social History  Substance Use Topics  . Smoking status: Current Every Day Smoker    Packs/day: 1.00    Years: 40.00    Types: Cigarettes  . Smokeless tobacco: Never Used  . Alcohol use Yes     Comment: "bourbon"     Allergies   Lisinopril   Review of Systems Review of Systems 10 Systems reviewed and are negative for acute change except as noted in the HPI.   Physical Exam Updated Vital Signs BP 125/78   Pulse 86   Temp 98.3 F (36.8 C) (Oral)   Resp 17   SpO2 97%   Physical Exam  Constitutional: He appears well-developed and well-nourished. No distress.  HENT:  Head: Normocephalic and atraumatic.  Eyes: Conjunctivae are normal.  Neck: Neck supple.  Cardiovascular: Normal rate and regular rhythm.   No murmur heard. Pulmonary/Chest: Effort normal and breath sounds normal. No respiratory distress.  Abdominal: Soft. He exhibits no distension and no mass. There is no tenderness. There is no guarding.  Genitourinary:  Genitourinary Comments: No formed stool in the vault. Trace yellow-brown stool. Prostate is enlarged and firm.  Musculoskeletal: He exhibits no edema.  Neurological: He is alert.  Skin: Skin is warm and dry.  Psychiatric: He has a normal mood and  affect.  Nursing note and vitals reviewed.    ED Treatments / Results  Labs (all labs ordered are listed, but only abnormal results are displayed) Labs Reviewed  URINALYSIS, ROUTINE W REFLEX MICROSCOPIC - Abnormal; Notable for the following:       Result Value   Ketones, ur 20 (*)    All other components within normal limits  POC OCCULT BLOOD, ED    EKG  EKG Interpretation None       Radiology Dg Abd Acute W/chest  Result Date: 04/25/2016 CLINICAL DATA:  Abdominal pain and constipation. EXAM: DG ABDOMEN ACUTE W/ 1V CHEST COMPARISON:  Chest x-ray 08/11/2015 FINDINGS: The lungs are clear wiithout focal pneumonia, edema, pneumothorax or pleural effusion. The cardiopericardial silhouette is within normal limits for size. The visualized bony structures of the thorax are intact. Upright film shows no evidence for intraperitoneal free air. There is no evidence for gaseous bowel dilation to suggest obstruction. Visualized bony anatomy is unremarkable. IMPRESSION: Negative abdominal radiographs.  No acute cardiopulmonary disease. Electronically Signed   By: Kennith Center M.D.   On: 04/25/2016 23:15    Procedures Procedures (including critical care time)  Medications Ordered in ED Medications - No data to display   Initial Impression / Assessment and Plan / ED Course  I have reviewed the triage vital signs and the nursing notes.  Pertinent labs & imaging results that were available during my care of the patient were reviewed by me and considered in my medical decision making (see chart for details).  Clinical Course      Final Clinical Impressions(s) / ED Diagnoses   Final diagnoses:  Constipation, unspecified constipation type  Prostate enlargement  Patient's abdomen is soft and nontender. He reports having 2 bowel movements today. Acute abdominal series does not show significant amount of stool. Patient is instructed to take twice daily Colace to avoid further constipation.  Currently he does not seem to have urinary retention. Prostate is enlarged on examination. Follow-up plan is provided.  New Prescriptions New Prescriptions   DOCUSATE SODIUM (COLACE) 100 MG CAPSULE    Take 1 capsule (100 mg total) by mouth every 12 (twelve) hours.     Lebron Conners  Donnald Garre, MD 04/26/16 3734

## 2016-04-25 NOTE — ED Triage Notes (Addendum)
Pt reports constipation and difficulty with urination onset 2 days ago. Pt took a laxative and had a bowel movement and was able to urinate. Pt home health nurse recommended pt come to ED for further eval of possible prostate issue. Pt last urinated this morning and had a normal bowel movement.

## 2016-04-26 ENCOUNTER — Telehealth: Payer: Self-pay

## 2016-04-26 MED ORDER — DOCUSATE SODIUM 100 MG PO CAPS: 100.0000 mg | ORAL_CAPSULE | Freq: Two times a day (BID) | ORAL | 0 refills | Status: DC

## 2016-04-26 NOTE — Discharge Instructions (Signed)
Your prostate is enlarged on physical examination. You must see a urologist for further evaluation of this. Take Colace twice daily as prescribed for constipation.

## 2016-04-26 NOTE — Telephone Encounter (Signed)
Catherine therapist with [facility] would like Plan of care orders for patient to receive OT  1xs a week for 1 week and 2xs a week for 3 weeks. Also verbal orders for patient to have a home health aide 2xs a week for 3 weeks. Verbal order given to [facility]

## 2016-05-07 DIAGNOSIS — I69351 Hemiplegia and hemiparesis following cerebral infarction affecting right dominant side: Secondary | ICD-10-CM | POA: Diagnosis not present

## 2016-05-07 DIAGNOSIS — R1310 Dysphagia, unspecified: Secondary | ICD-10-CM

## 2016-05-07 DIAGNOSIS — I69354 Hemiplegia and hemiparesis following cerebral infarction affecting left non-dominant side: Secondary | ICD-10-CM

## 2016-05-07 DIAGNOSIS — I69291 Dysphagia following other nontraumatic intracranial hemorrhage: Secondary | ICD-10-CM | POA: Diagnosis not present

## 2016-05-14 ENCOUNTER — Other Ambulatory Visit: Payer: Self-pay | Admitting: Physical Medicine and Rehabilitation

## 2016-05-14 DIAGNOSIS — I119 Hypertensive heart disease without heart failure: Secondary | ICD-10-CM

## 2016-05-20 ENCOUNTER — Other Ambulatory Visit: Payer: Self-pay | Admitting: Physical Medicine and Rehabilitation

## 2016-05-20 DIAGNOSIS — I119 Hypertensive heart disease without heart failure: Secondary | ICD-10-CM

## 2016-06-12 ENCOUNTER — Other Ambulatory Visit: Payer: Self-pay | Admitting: Physical Medicine and Rehabilitation

## 2016-06-14 ENCOUNTER — Other Ambulatory Visit: Payer: Self-pay | Admitting: Physical Medicine and Rehabilitation

## 2016-06-16 ENCOUNTER — Ambulatory Visit: Payer: Medicare HMO | Admitting: Neurology

## 2016-06-17 ENCOUNTER — Encounter: Payer: Self-pay | Admitting: Cardiovascular Disease

## 2016-06-17 ENCOUNTER — Other Ambulatory Visit: Payer: Self-pay | Admitting: Physical Medicine and Rehabilitation

## 2016-06-18 ENCOUNTER — Other Ambulatory Visit: Payer: Self-pay | Admitting: Physical Medicine and Rehabilitation

## 2016-06-24 ENCOUNTER — Ambulatory Visit (INDEPENDENT_AMBULATORY_CARE_PROVIDER_SITE_OTHER): Payer: Medicare HMO | Admitting: Cardiovascular Disease

## 2016-06-24 ENCOUNTER — Encounter: Payer: Self-pay | Admitting: Cardiovascular Disease

## 2016-06-24 ENCOUNTER — Encounter (INDEPENDENT_AMBULATORY_CARE_PROVIDER_SITE_OTHER): Payer: Self-pay

## 2016-06-24 VITALS — BP 150/90 | HR 86 | Ht 64.0 in | Wt 136.4 lb

## 2016-06-24 DIAGNOSIS — E7800 Pure hypercholesterolemia, unspecified: Secondary | ICD-10-CM

## 2016-06-24 DIAGNOSIS — I2510 Atherosclerotic heart disease of native coronary artery without angina pectoris: Secondary | ICD-10-CM

## 2016-06-24 DIAGNOSIS — I255 Ischemic cardiomyopathy: Secondary | ICD-10-CM

## 2016-06-24 DIAGNOSIS — I10 Essential (primary) hypertension: Secondary | ICD-10-CM | POA: Diagnosis not present

## 2016-06-24 DIAGNOSIS — Z720 Tobacco use: Secondary | ICD-10-CM

## 2016-06-24 NOTE — Patient Instructions (Signed)

## 2016-06-24 NOTE — Progress Notes (Signed)
Chief Complaint  Patient presents with  . 6 month follow up     History of Present Illness: [name] with history of CAD, CVA , ischemic cardiomyopathy, etoh abuse, seizures here today for cardiac follow up. He had his first MI in 2008. He was admitted with bradycardia, chest pain and taken to cath lab as urgent NSTEMI 04/11/12 and was found to have a totally occluded distal RCA. This was treated with a drug eluting stent in the mid to distal RCA and angioplasty of the Posterolateral segment. His LVEF post MI was 35-40%. He was admitted to [facility] December 2016 with right frontoparietal IC hemorrhage in the setting of uncontrolled HTN. Echo during that admission demonstrated LVEF 40-45%.  There was concern for right frontal encephalomalacia and concern for embolic infarcts. Outpatient event monitor planned. He was discharged to inpatient rehabilitation and seen in follow up in our office March 2017. Repeat imaging demonstrated resolution of IC hemorrhage. ASA started in Neurology clinic. Cardiac event monitor May 2017 with sinus rhythm, no atrial fibrillation or heart block. Admitted to [facility] December 2017 with PEA arrest and found to have CHF and encephalopathy. Echo December 2017 with LVEF=45-50%. Mild MR  He is here today for follow up. He tells me that he is feeling well. No exertional chest pain. No SOB.  Energy level is ok.   Primary Care Physician: Pcp Not In System   Past Medical History:  Diagnosis Date  . CAD (coronary artery disease)    a. prior MI in 2006 b. 03/2012: 30% ostial LM stenosis, 30% pLAD stenosis, 60% mid-LADm 30% pCx, 3rd OM which was small in size with diffuse 90% stenosis involving both branches, and RCA with 50% mid-stenosis and 100% distal occlusion. POBA to PL branch and DES placement to dRCA.  . Claudication (HCC)   . Coronary artery disease    MI in 2008 with PCI to ? vessel; NSTEMI 03/2012; NSTEMI 04/2012 Cath showed triple vessel dz including occluded RCA, s/p  DES to distal RCA, PTCA posterolateral branch   . CVA (cerebral infarction)    03/2012 with left arm weakness/numbness and left facial droop.  [name] ETOH abuse    quit 04/06/12  . ETOH abuse   . History of stroke 03/2015   right frontal hemorrhage  . HTN (hypertension)   . Hyperlipidemia   . Hypertension   . Ischemic cardiomyopathy    04/2012 EF 35-40%  . Ischemic cardiomyopathy    a. EF 40-45% by echo in 03/2015  . Seizures (HCC)   . Stroke (HCC)   . Tobacco abuse    quit 04/06/12  . Tobacco abuse   . Withdrawal seizures [facility])     Past Surgical History:  Procedure Laterality Date  . CORONARY ANGIOPLASTY WITH STENT PLACEMENT  03/2012  . HERNIA REPAIR    . LEFT HEART CATHETERIZATION WITH CORONARY ANGIOGRAM N/A 04/11/2012   Procedure: LEFT HEART CATHETERIZATION WITH CORONARY ANGIOGRAM;  Surgeon: Kathleene Hazel, MD;  Location[facility] CATH LAB;  Service: Cardiovascular;  Laterality: N/A;  . PERCUTANEOUS CORONARY INTERVENTION-BALLOON ONLY  04/11/2012   Procedure: PERCUTANEOUS CORONARY INTERVENTION-BALLOON ONLY;  Surgeon: Kathleene Hazel, MD;  Location: [facility] CATH LAB;  Service: Cardiovascular;;  RPLA  . PERCUTANEOUS CORONARY STENT INTERVENTION (PCI-S)  04/11/2012   Procedure: PERCUTANEOUS CORONARY STENT INTERVENTION (PCI-S);  Surgeon: Kathleene Hazel, MD;  Location: [facility] CATH LAB;  Service: Cardiovascular;;  Distal RCA    Current Outpatient Prescriptions  Medication Sig Dispense Refill  .  amLODipine (NORVASC) 10 MG tablet Take 1 tablet (10 mg total) by mouth daily. 30 tablet 0  . atorvastatin (LIPITOR) 80 MG tablet Take 1 tablet (80 mg total) by mouth daily at 6 PM. 30 tablet 0  . docusate sodium (COLACE) 100 MG capsule Take 1 capsule (100 mg total) by mouth every 12 (twelve) hours. 60 capsule 0  . fluticasone (FLONASE) 50 MCG/ACT nasal spray Place 2 sprays into both nostrils daily.  2  . folic acid (FOLVITE) 1 MG tablet Take 1 tablet (1 mg total) by mouth daily. 30 tablet  0  . hydrALAZINE (APRESOLINE) 50 MG tablet Take 1.5 tablets (75 mg total) by mouth 3 (three) times daily. 135 tablet 0  . metoprolol (LOPRESSOR) 100 MG tablet Take 1 tablet (100 mg total) by mouth 2 (two) times daily. 60 tablet 0  . Multiple Vitamin (MULTIVITAMIN WITH MINERALS) TABS tablet Take 1 tablet by mouth daily.    . nitroGLYCERIN (NITROSTAT) 0.4 MG SL tablet Place 1 tablet (0.4 mg total) under the tongue every 5 (five) minutes as needed for chest pain (up to 3 doses). 25 tablet 3  . potassium chloride 20 MEQ/15ML (10%) SOLN Take 7.5 mLs (10 mEq total) by mouth daily. 900 mL 0  . spironolactone (ALDACTONE) 25 MG tablet Take 1 tablet (25 mg total) by mouth daily. 30 tablet 0  . thiamine 100 MG tablet Take 1 tablet (100 mg total) by mouth daily. 30 tablet 0   No current facility-administered medications for this visit.     Allergies  Allergen Reactions  . Lisinopril Swelling and Other (See Comments)    angioedema    Social History   Social History  . Marital status: Single    Spouse name: N/A  . Number of children: N/A  . Years of education: N/A   Occupational History  . Not on file.   Social History Main Topics  . Smoking status: Current Every Day Smoker    Packs/day: 1.00    Years: 40.00    Types: Cigarettes  . Smokeless tobacco: Never Used  . Alcohol use Yes     Comment: "bourbon"  . Drug use: Yes    Types: Benzodiazepines, Marijuana  . Sexual activity: No   Other Topics Concern  . Not on file   Social History Narrative   ** Merged History Encounter **        Family History  Problem Relation Age of Onset  . Heart attack Father   . Hypertension Father   . Cancer Mother   . Stroke Sister   . Hypertension Mother   . Hypertension Sister   . Hypertension Brother     Review of Systems:  As stated in the HPI and otherwise negative.   BP (!) 150/90   Pulse 86   Ht 5\' 4"  (1.626 m)   Wt 136 lb 6.4 oz (61.9 kg)   BMI 23.41 kg/m   Physical  Examination: General: Well developed, well nourished, NAD  HEENT: OP clear, mucus membranes moist  SKIN: warm, dry. No rashes. Neuro: No focal deficits  Musculoskeletal: Muscle strength 5/5 all ext  Psychiatric: Mood and affect normal  Neck: No JVD, no carotid bruits, no thyromegaly, no lymphadenopathy.  Lungs:Clear bilaterally, no wheezes, rhonci, crackles Cardiovascular: Regular rate and rhythm. No murmurs, gallops or rubs. Abdomen:Soft. Bowel sounds present. Non-tender.  Extremities: No lower extremity edema. Pulses are 2 + in the bilateral DP/PT.  Echo: 03/26/16: Left ventricle: The cavity size was normal. There  was mild   concentric hypertrophy. Systolic function was mildly reduced. The   estimated ejection fraction was in the range of 45% to 50%.   Hypokinesis of the inferior and inferoseptal myocardium. Doppler   parameters are consistent with abnormal left ventricular   relaxation (grade 1 diastolic dysfunction). - Aortic valve: There was no regurgitation. - Mitral valve: Transvalvular velocity was within the normal range.   There was no evidence for stenosis. There was mild regurgitation   directed posteriorly. - Right ventricle: The cavity size was normal. Wall thickness was   normal. Systolic function was normal. - Tricuspid valve: There was mild regurgitation. - Pulmonary arteries: Systolic pressure was moderately increased.   PA peak pressure: 51 mm Hg (S). - Pericardium, extracardiac: A trivial pericardial effusion was   identified.  Cardiac cath 04/11/12: Left main: Ostial 30% stenosis with heavy calcification.  Left Anterior Descending Artery: Large caliber vessel that courses to the apex. The proximal vessel has 30% stenosis. The mid vessel has a 60% stenosis at the takeoff of a septal perforating branch. Moderate caliber diagonal branch with mild plaque disease.  Circumflex Artery: Moderate caliber vessel. The proximal vessel has diffuse 30% stenosis. The first 2  obtuse marginal branches are very small. The third obtuse marginal branch is a small bifurcating vessel with diffuse 90% stenosis involving both branches. (1.75 mm vessel)  Right Coronary Artery: Large, dominant artery with 50% mid stenosis and 100% distal occlusion.  Left Ventricular Angiogram: LVEF=30-35% with akinesis of the inferior wall.  EKG:  EKG is not ordered today. The ekg ordered today demonstrates   Recent Labs: 03/23/2016: B Natriuretic Peptide 540.2 03/30/2016: Magnesium 2.2 04/01/2016: ALT 21 04/13/2016: BUN 9; Creatinine, Ser 0.96; Hemoglobin 13.8; Platelets 336; Potassium 4.0; Sodium 139   Lipid Panel    Component Value Date/Time   CHOL 179 04/07/2015 0203   TRIG 113 03/25/2016 0325   HDL 96 04/07/2015 0203   CHOLHDL 1.9 04/07/2015 0203   VLDL 13 04/07/2015 0203   LDLCALC 70 04/07/2015 0203     Wt Readings from Last 3 Encounters:  06/24/16 136 lb 6.4 oz (61.9 kg)  04/14/16 135 lb 2.3 oz (61.3 kg)  03/31/16 150 lb 1.6 oz (68.1 kg)     Other studies Reviewed: Additional studies/ records that were reviewed today include: . Review of the above records demonstrates:    Assessment and Plan:   1. CAD without angina: He has no chest pain suggestive of angina. Mild LV dysfunction by echo December 2017, unchanged. Will continue statin and beta blocker. He will discuss restarting ASA with Neurology.    2. Ischemic cardiomyopathy: LVEF 45-50% by echo December 20167  3. Tobacco abuse: Smoking cessation recommended. He wishes to stop  4. HTN: BP is elevated today. He will follow at home with the cuff he was given to use at home. He will follow up in primary care to have this managed.    5. Hyperlipidemia: Continue statin. Lipids well controlled.   6. History of CVA: He is followed in Neurology.   Current medicines are reviewed at length with the patient today.  The patient does not have concerns regarding medicines.  The following changes have been made:  no  change  Labs/ tests ordered today include:  No orders of the defined types were placed in this encounter.   Disposition:   FU with me in 6 months  Signed, Verne Carrow, MD 06/24/2016 4:43 PM    [facility] [address]  [address], [place], [place]  [address] Phone: [phone]; Fax: [phone]

## 2016-07-07 ENCOUNTER — Telehealth: Payer: Self-pay | Admitting: Cardiovascular Disease

## 2016-07-07 NOTE — Telephone Encounter (Signed)
Clearance form has been faxed and is scanned into chart. Will forward to medical records to send requested information

## 2016-07-07 NOTE — Telephone Encounter (Signed)
New message  Fax over notes to [phone] Last office notes if available, lab results, echo, ekg  Needs some medical records and a copy of his last test results sent to them before they do oral surgery.

## 2016-07-12 ENCOUNTER — Other Ambulatory Visit: Payer: Self-pay | Admitting: Physical Medicine and Rehabilitation

## 2016-08-23 ENCOUNTER — Ambulatory Visit: Payer: Medicare HMO | Admitting: Neurology

## 2016-09-07 ENCOUNTER — Encounter: Payer: Self-pay | Admitting: Neurology

## 2016-09-07 ENCOUNTER — Ambulatory Visit: Payer: Medicare HMO | Admitting: Neurology

## 2016-09-07 ENCOUNTER — Ambulatory Visit (INDEPENDENT_AMBULATORY_CARE_PROVIDER_SITE_OTHER): Payer: Medicare HMO | Admitting: Neurology

## 2016-09-07 VITALS — BP 115/75 | HR 74 | Ht 64.0 in | Wt 129.2 lb

## 2016-09-07 DIAGNOSIS — I255 Ischemic cardiomyopathy: Secondary | ICD-10-CM | POA: Diagnosis not present

## 2016-09-07 DIAGNOSIS — I63412 Cerebral infarction due to embolism of left middle cerebral artery: Secondary | ICD-10-CM

## 2016-09-07 DIAGNOSIS — I638 Other cerebral infarction: Secondary | ICD-10-CM

## 2016-09-07 DIAGNOSIS — E785 Hyperlipidemia, unspecified: Secondary | ICD-10-CM | POA: Diagnosis not present

## 2016-09-07 DIAGNOSIS — I10 Essential (primary) hypertension: Secondary | ICD-10-CM | POA: Diagnosis not present

## 2016-09-07 DIAGNOSIS — I6389 Other cerebral infarction: Secondary | ICD-10-CM

## 2016-09-07 MED ORDER — ASPIRIN EC 325 MG PO TBEC: 325.0000 mg | DELAYED_RELEASE_TABLET | Freq: Every day | ORAL | 0 refills | Status: DC

## 2016-09-07 NOTE — Patient Instructions (Addendum)
-   continue ASA 325mg  and lipitor for stroke prevention. Buy ASA over the counter will be cheaper. - check BP and record and bring over to PCP for medication adjustment if needed. goal 120-150 due to right M1 stenosis - follow up with cardiologist  - quit smoking - Follow up with your primary care physician for stroke risk factor modification. Recommend maintain blood pressure goal 120-150/70-90, diabetes with hemoglobin A1c goal below 6.5% and lipids with LDL cholesterol goal below 70 mg/dL.  - healthy diet and regular exercise - follow up in 6 months

## 2016-09-07 NOTE — Progress Notes (Signed)
STROKE NEUROLOGY FOLLOW UP NOTE  NAME: Gumecindo Hopkin DOB: May 28, 1950  REASON FOR VISIT: stroke follow up HISTORY FROM: pt and chart Today we had the pleasure of seeing Quaid Yeakle in follow-up at our [facility]. Pt was accompanied by no one.   History Summary Mr. Johney Perotti is a 66 y.o. male with history of HTN, CAD with previous MI, previous CVA, ischemic cardiomyopathy, HLD, PVD, tobacco use, and alcohol abuse admitted on 04/04/15 for left hemiparesis and left facial droop. CT revealed large R frontoparietal hemorrhage. Started on scheduled mannitol and admitted to ICU. Mannitol changed to 3% with cerebral edema. Repeat CT stable. CUS unremarkable. TTE showed EF 35-40%. LDL 86 and A1C 6.4. BP gradually controlled. Continued improvement with discharge from ICU to IP rehab. Due to hx of stroke and low EF, concerning for hemorrhagic infarct. 30 day cardiac event monitoring recommended on discharge.  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 He stayed in CIR for about 2 weeks and then d/c home. BP much better controlled, BP 144/96. On coreg, hydralazine, losartan, and spironolactone. He is also on lipitor for HLD. He has not quit smoking yet but willing to use patch after discussion with cardiology this month. Repeat MRI and MRA on 07/31/15 showed resolution of hemorrahage, but also showed right M1 stenosis progressed from before, which consistent with multiple right MCA infarcts in the past.   He had ED visit on 08/13/15 due to left arm shaking, lasting 10 sec. Denies LOC, leg shaking or facial twitching. EMS called and he was sent to ED. Head CT no acute abnormalities. He was put on keppra 500mg  bid and discharged home.  Follow up 03/12/16 - pt has been doing the same. No seizure or stroke like symptoms. On ASA and lipitor  and keppra. BP today 139/84. Has not followed up with cardiology yet for the low EF. Has not quit smoking yet, but cutting down.   Interval History During the interval time, pt has been doing the same. He did not continue ASA at home as he did not buy OTC. He saw cardiology in 06/2016 and recommended to restart ASA. Repeat TTE showed improved EF. He has been off keppra and he does not know who took him off it. However, he did not have seizure except last Saturday he had some left arm tremor during a family re-union. It sounds more like clonus to me. His BP 115/75 today. He has not quit smoking yet but cut down.   REVIEW OF SYSTEMS: Full 14 system review of systems performed and notable only for those listed below and in HPI above, all others are negative:  Constitutional:   Cardiovascular:  Ear/Nose/Throat:  Hearing loss, drooling Skin:  Eyes:  Blurry vision Respiratory:   Gastroitestinal:   Genitourinary:  Hematology/Lymphatic:   Endocrine:  Musculoskeletal:  Walking difficulty Allergy/Immunology:   Neurological:   Psychiatric:  Sleep:   The following represents the patient's updated allergies and side effects list: Allergies  Allergen Reactions  . Lisinopril Swelling and Other (See Comments)    angioedema    The neurologically relevant items on the patient's problem list were reviewed on today's visit.  Neurologic Examination  A problem focused neurological exam (12 or more points of the single system neurologic examination, vital signs counts as 1 point, cranial nerves count for 8 points)  was performed.  Blood pressure 115/75, pulse 74, height 5\' 4"  (1.626 m), weight 129 lb 3.2 oz (58.6 kg).  General - Well nourished, well developed, in no apparent distress.  Ophthalmologic - Fundi not visualized due to noncooperation.  Cardiovascular - Regular rate and rhythm with no murmur.  Mental Status -  Level of arousal and orientation to time, place, and person were  intact. Language including expression, naming, repetition, comprehension was assessed and found intact. Fund of Knowledge was assessed and was intact.  Cranial Nerves II - XII - II - Visual field intact OU. III, IV, VI - Extraocular movements intact. V - Facial sensation intact bilaterally. VII - left facial droop. VIII - Hearing & vestibular intact bilaterally. X - Palate elevates symmetrically. XI - Chin turning & shoulder shrug intact bilaterally. XII - Tongue protrusion intact.  Motor Strength - The patient's strength was normal in all extremities except left UE 4/5 distal and left hand dexterity difficulty.  Bulk was normal and fasciculations were absent.   Motor Tone - Muscle tone was assessed at the neck and appendages and was normal.  Reflexes - The patient's reflexes were 1+ in all extremities and he had no pathological reflexes.  Sensory - Light touch, temperature/pinprick were assessed and were normal.    Coordination - The patient had normal movements in the hands and feet with no ataxia or dysmetria.  Tremor was absent.  Gait and Station - The patient's transfers, posture, gait, station, and turns were observed as normal.   Data reviewed: I personally reviewed the images and agree with the radiology interpretations.  CT HEAD 04/09/2015 1. No significant change in large right frontal lobe intra-axial hemorrhage since 04/07/2015. Estimated hematoma volume 83 mL. 2. Mildly increased surrounding edema and regional mass effect. Leftward anterior midline shift now 6 mm, previously 3-4 mm. No ventriculomegaly and basilar cisterns remain patent. 3. Little to no extra-axial extension of blood (questionable trace subarachnoid extension on image 16 today). 4. No new intracranial abnormality. 04/07/2015 Evolving large RIGHT frontal intraparenchymal hematoma. Similar 3 mm RIGHT to LEFT midline shift without ventricular entrapment. However, by my reading, pt midline shift has  increased to 6mm, mass effect more prominent at superior frontal region.  04/05/2015: 1. Large RIGHT frontal intracranial hemorrhage is slightly contracted in volume. 2. Decrease in severity of mile leftward midline shift. 3. No new intracranial hemorrhage. 4. No extension to the ventricular system. 5. Basal cisterns are patent. 04/04/2015  Large acute right frontoparietal intracranial hemorrhage, with evidence of active extravasation and 8 mm leftward midline shift. No evidence of downward herniation.   2D ECHO - Left ventricle: The cavity size was normal. Wall thickness wasincreased in a pattern of moderate LVH. Systolic function wasmildly to moderately reduced. The estimated ejection fraction wasin the range of 40% to 45%. There is akinesis of themid-apicalinferolateral myocardium. Doppler parameters areconsistent with abnormal left ventricular relaxation (grade 1 diastolic dysfunction). - Mitral valve: There was mild regurgitation. - Impressions: No cardiac source of emboli was indentified. Compared to theprior study, there has been no significant interval change.  Carotid Doppler 1-39% internal carotid artery stenosis bilaterally. Vertebral arteries are patent with antegrade flow.  RUE venous doppler Superificial Acute thrombus identified in the Right Cephalic Vein extends down to the forearm. No DVT   MRI brain 07/31/15 - 1. Subacute-chronic right frontal intracerebral hemorrhage (3.3x3.2x2.7cm; APxtransxSI). Compared to CT on 04/09/15 there is reduced size, mass effect, midline shift and edema. 2. No definite evidence of underlying mass  or vascular malformation. Most likely represents chronic hemorrhagic infarction.   MRA head 07/31/15 - 1. The right middle cerebral artery M2 segment has high grade stenosis / tapering with decreased signal.  2. The left MCA and bilateral ACA have no stenosis.  3. The posterior circulation is normal. 4. Compared to MRI on 04/03/12, the  right M2 segment of right MCA has increased high grade stenosis / tapering.  CT head without contrast -  1. No acute intracranial abnormality. 2. Encephalomalacia and likely gliosis involving the right frontal lobe at the site of prior hemorrhage. 3. Mild chronic bilateral maxillary and ethmoid sinus disease.  EEG 03/23/16 This sedated EEG is abnormal due to diffuse background suppression and slowing.  Clinical Correlation of the above findings indicates diffuse cerebral dysfunction that is non-specific in etiology and can be seen with hypoxic/ischemic injury, toxic/metabolic encephalopathies, or medication effect from Fentanyl and Propofol. There were no electrographic seizures seen in this study. Clinical correlation is advised.  TTE 03/2016 - Left ventricle: The cavity size was normal. There was mild   concentric hypertrophy. Systolic function was mildly reduced. The   estimated ejection fraction was in the range of 45% to 50%.   Hypokinesis of the inferior and inferoseptal myocardium. Doppler   parameters are consistent with abnormal left ventricular   relaxation (grade 1 diastolic dysfunction). - Aortic valve: There was no regurgitation. - Mitral valve: Transvalvular velocity was within the normal range.   There was no evidence for stenosis. There was mild regurgitation   directed posteriorly. - Right ventricle: The cavity size was normal. Wall thickness was   normal. Systolic function was normal. - Tricuspid valve: There was mild regurgitation. - Pulmonary arteries: Systolic pressure was moderately increased.   PA peak pressure: 51 mm Hg (S). - Pericardium, extracardiac: A trivial pericardial effusion was   identified.  Component     Latest Ref Rng 04/07/2015  Cholesterol     0 - 200 mg/dL 147  Triglycerides     <150 mg/dL 67  HDL Cholesterol     >40 mg/dL 96  Total CHOL/HDL Ratio      1.9  VLDL     0 - 40 mg/dL 13  LDL (calc)     0 - 99 mg/dL 70  Hemoglobin W2N      4.8 - 5.6 % 6.1 (H)  Mean Plasma Glucose      128  TSH     0.350 - 4.500 uIU/mL 0.557  HIV     Non Reactive Non Reactive  RPR     Non Reactive Non Reactive  Vitamin B12     180 - 914 pg/mL 491    Assessment: As you may recall, he is a 66 y.o. African American male with PMH of HTN, CAD with previous MI, previous CVA, ischemic cardiomyopathy, HLD, PVD, tobacco use, and alcohol abuse admitted on 04/04/15 for large R frontoparietal hemorrhage. Treated with 3% saline for cerebral edema. Repeat CT stable. CUS unremarkable. TTE showed EF 35-40%. LDL 86 and A1C 6.4. BP gradually controlled. Continued improvement with discharge from ICU to IP rehab. During the interval time, has not quit smoking yet. Repeat MRI and MRA on 07/31/15 showed resolution of hemorrahage, but also showed right M1 stenosis progressed from before, which consistent with multiple right MCA infarcts in the past. His ICH also considered as hemorrhagic infarcts. 30 day cardiac event monitoring showed no afib. He had on 08/13/15 left arm shaking, lasting 10 sec, concerning for partial seizure.  He was put on keppra 500mg  bid. Since then, no more seizure like activities. However, his keppra was tapered off over the time, and he had no seizure except one episode sounds more like clonus at LUE. EEG in 03/2016 showed diffuse slowing. Still not quit smoking yet. Followed up with cardiology and repeat TTE showed EF 45-50%.  Plan:  - continue ASA 325mg  and lipitor for stroke prevention. Buy ASA over the counter will be cheaper. - check BP and record and bring over to PCP for medication adjustment if needed. goal 120-150 due to right M1 stenosis - follow up with cardiologist  - quit smoking completely - Follow up with your primary care physician for stroke risk factor modification. Recommend maintain blood pressure goal 120-150/70-90, diabetes with hemoglobin A1c goal below 6.5% and lipids with LDL cholesterol goal below 70 mg/dL.  - healthy diet  and regular exercise - follow up in 6 months with carolyn. If stable, can be d/c from clinic.  I spent more than 25 minutes of face to face time with the patient. Greater than 50% of time was spent in counseling and coordination of care. We discussed seizure precautions, cardiology follow up, quit smoking and BP management.    No orders of the defined types were placed in this encounter.   Meds ordered this encounter  Medications  . aspirin EC 325 MG tablet    Sig: Take 1 tablet (325 mg total) by mouth daily.    Dispense:  30 tablet    Refill:  0    Patient Instructions  - continue ASA 325mg  and lipitor for stroke prevention. Buy ASA over the counter will be cheaper. - check BP and record and bring over to PCP for medication adjustment if needed. goal 120-150 due to right M1 stenosis - follow up with cardiologist  - quit smoking - Follow up with your primary care physician for stroke risk factor modification. Recommend maintain blood pressure goal 120-150/70-90, diabetes with hemoglobin A1c goal below 6.5% and lipids with LDL cholesterol goal below 70 mg/dL.  - healthy diet and regular exercise - follow up in 6 months   Marvel Plan, MD PhD [facility] [address], [address] [place], [place] [address] [phone]

## 2016-10-26 ENCOUNTER — Encounter (HOSPITAL_COMMUNITY): Payer: Self-pay | Admitting: Emergency Medicine

## 2016-10-26 ENCOUNTER — Emergency Department (HOSPITAL_COMMUNITY)
Admission: EM | Admit: 2016-10-26 | Discharge: 2016-10-26 | Disposition: A | Discharge status: Home / Self Care | Payer: Medicare HMO | Source: Home / Self Care | Attending: Emergency Medicine | Admitting: Emergency Medicine

## 2016-10-26 ENCOUNTER — Encounter (HOSPITAL_COMMUNITY): Payer: Self-pay

## 2016-10-26 ENCOUNTER — Emergency Department (HOSPITAL_COMMUNITY)
Admission: EM | Admit: 2016-10-26 | Discharge: 2016-10-27 | Disposition: A | Discharge status: Home / Self Care | Payer: Medicare HMO | Attending: Emergency Medicine | Admitting: Emergency Medicine

## 2016-10-26 DIAGNOSIS — Z79899 Other long term (current) drug therapy: Secondary | ICD-10-CM

## 2016-10-26 DIAGNOSIS — R040 Epistaxis: Secondary | ICD-10-CM

## 2016-10-26 DIAGNOSIS — F1010 Alcohol abuse, uncomplicated: Secondary | ICD-10-CM | POA: Insufficient documentation

## 2016-10-26 DIAGNOSIS — I10 Essential (primary) hypertension: Secondary | ICD-10-CM

## 2016-10-26 DIAGNOSIS — Z7982 Long term (current) use of aspirin: Secondary | ICD-10-CM | POA: Insufficient documentation

## 2016-10-26 DIAGNOSIS — I2510 Atherosclerotic heart disease of native coronary artery without angina pectoris: Secondary | ICD-10-CM | POA: Insufficient documentation

## 2016-10-26 DIAGNOSIS — Z8673 Personal history of transient ischemic attack (TIA), and cerebral infarction without residual deficits: Secondary | ICD-10-CM

## 2016-10-26 DIAGNOSIS — Z955 Presence of coronary angioplasty implant and graft: Secondary | ICD-10-CM

## 2016-10-26 DIAGNOSIS — F17210 Nicotine dependence, cigarettes, uncomplicated: Secondary | ICD-10-CM | POA: Diagnosis not present

## 2016-10-26 LAB — I-STAT CHEM 8, ED
BUN: 9 mg/dL (ref 6–20)
Calcium, Ion: 1.23 mmol/L (ref 1.15–1.40)
Chloride: 102 mmol/L (ref 101–111)
Creatinine, Ser: 0.8 mg/dL (ref 0.61–1.24)
Glucose, Bld: 129 mg/dL — ABNORMAL HIGH (ref 65–99)
HEMATOCRIT: 40 % (ref 39.0–52.0)
HEMOGLOBIN: 13.6 g/dL (ref 13.0–17.0)
POTASSIUM: 3.2 mmol/L — AB (ref 3.5–5.1)
SODIUM: 139 mmol/L (ref 135–145)
TCO2: 25 mmol/L (ref 0–100)

## 2016-10-26 MED ORDER — OXYMETAZOLINE HCL 0.05 % NA SOLN
1.0000 | Freq: Once | NASAL | Status: AC
  Administered 2016-10-26: 1 via NASAL
  Filled 2016-10-26: qty 15

## 2016-10-26 NOTE — ED Triage Notes (Signed)
Pt states his nose started bleeding yesterday evening but would stop if he laid on his back. Started bleeding again this am around 0600. Pt does take ASA and endorses having hypertension, states he has been taking his medications. Hypertensive in triage

## 2016-10-26 NOTE — Discharge Instructions (Signed)
Follow-up with her primary care doctor in 24-48 hours for further evaluation. Call her office and tell you were seen in the emergency department and arrange for an appointment.  As we discussed, the nosebleed return to need to apply direct pressure to the nasal bridge area for at least 15 minutes. Clear head back. Do not pick or hit your nose.   Return the emergency Department for any worsening bleeding, difficulty swallowing, chest pain, difficulty breathing or any other worsening or concerning symptoms.

## 2016-10-26 NOTE — ED Provider Notes (Signed)
[facility] Provider Note   CSN: [id number] Arrival date & time: 10/26/16  9562     History   Chief Complaint Chief Complaint  Patient presents with  . Epistaxis    HPI Randall Patel is a 66 y.o. male who presents with Epistaxis that began last night at 2 AM. Patient states that the nosebleed lasted for approximately 40 minutes. He reports that he did not apply pressure to nasal bridge. He states that instead he leaned his head back and states that it eventually resolved on its own. Patient reports that he was sitting at his house when the symptoms began. He works that eventually the bleeding stopped on its own but returned this morning at 6 AM. Patient reports taking 81 mg of ASA daily but no other blood thinners. Patient does have a history of hypertension states he is compliant with his medications. He denies any trauma or injury to the nose.  The history is provided by the patient.    Past Medical History:  Diagnosis Date  . CAD (coronary artery disease)    a. prior MI in 2006 b. 03/2012: 30% ostial LM stenosis, 30% pLAD stenosis, 60% mid-LADm 30% pCx, 3rd OM which was small in size with diffuse 90% stenosis involving both branches, and RCA with 50% mid-stenosis and 100% distal occlusion. POBA to PL branch and DES placement to dRCA.  . Claudication (HCC)   . Coronary artery disease    MI in 2008 with PCI to ? vessel; NSTEMI 03/2012; NSTEMI 04/2012 Cath showed triple vessel dz including occluded RCA, s/p DES to distal RCA, PTCA posterolateral branch   . CVA (cerebral infarction)    03/2012 with left arm weakness/numbness and left facial droop.  [name] ETOH abuse    quit 04/06/12  . ETOH abuse   . History of stroke 03/2015   right frontal hemorrhage  . HTN (hypertension)   . Hyperlipidemia   . Hypertension   . Ischemic cardiomyopathy    04/2012 EF 35-40%  . Ischemic cardiomyopathy    a. EF 40-45% by echo in 03/2015  . Seizures (HCC)   . Stroke (HCC)   . Tobacco abuse      quit 04/06/12  . Tobacco abuse   . Withdrawal seizures [facility])     Patient Active Problem List   Diagnosis Date Noted  . Anoxic brain injury (HCC) 03/31/2016  . Pulseless electrical activity (HCC)   . Chronic obstructive pulmonary disease (HCC)   . Dysphagia, post-stroke   . Abnormal CT of brain   . Coronary artery disease involving native coronary artery of native heart without angina pectoris   . ETOH abuse   . Tachypnea   . Benign essential HTN   . Hyperglycemia   . Pain   . Leukocytosis   . Acute pulmonary edema (HCC)   . Respiratory failure (HCC)   . Sinus tachycardia   . Cardiac arrest (HCC) 03/23/2016  . Palpitations 08/15/2015  . History of stroke 08/15/2015  . Anterior cerebral circulation hemorrhagic infarction (HCC) 08/15/2015  . Hyperlipidemia 08/15/2015  . Muscle stiffness   . Cough   . Hemiparesis affecting left side as late effect of stroke (HCC)   . Dysphagia as late effect of cerebrovascular disease   . Essential hypertension   . Acute blood loss anemia   . 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 Dysphagia   . Dysarthria due to cerebrovascular accident   . Diastolic dysfunction   .  Tobacco abuse   . ETOH abuse   . History of CVA with residual deficit   . Coronary artery disease involving native coronary artery of native heart with angina pectoris (HCC)   . Tachypnea   . Hypernatremia   . Hypokalemia   . Thrombocytopenia (HCC)   . Intracranial hemorrhage (HCC)   . Essential hypertension, malignant   . ICH (intracerebral hemorrhage) (HCC) 04/04/2015  . STEMI (ST elevation myocardial infarction) (HCC) 04/14/2012  . NSTEMI (non-ST elevated myocardial infarction) (HCC) 04/14/2012  . Cardiomyopathy, ischemic 04/14/2012  . Cerebral embolism with cerebral infarction (HCC) 04/03/2012  . Unspecified transient cerebral ischemia 04/02/2012  . Hemiplegia, unspecified, affecting nondominant side 04/02/2012  . CAD (coronary  artery disease) s/p stent 2008 in [place] 04/02/2012  . Hypertension     Past Surgical History:  Procedure Laterality Date  . CORONARY ANGIOPLASTY WITH STENT PLACEMENT  03/2012  . HERNIA REPAIR    . LEFT HEART CATHETERIZATION WITH CORONARY ANGIOGRAM N/A 04/11/2012   Procedure: LEFT HEART CATHETERIZATION WITH CORONARY ANGIOGRAM;  Surgeon: Kathleene Hazel, MD;  Location: [facility] CATH LAB;  Service: Cardiovascular;  Laterality: N/A;  . PERCUTANEOUS CORONARY INTERVENTION-BALLOON ONLY  04/11/2012   Procedure: PERCUTANEOUS CORONARY INTERVENTION-BALLOON ONLY;  Surgeon: Kathleene Hazel, MD;  Location: [facility] CATH LAB;  Service: Cardiovascular;;  RPLA  . PERCUTANEOUS CORONARY STENT INTERVENTION (PCI-S)  04/11/2012   Procedure: PERCUTANEOUS CORONARY STENT INTERVENTION (PCI-S);  Surgeon: Kathleene Hazel, MD;  Location: [facility] CATH LAB;  Service: Cardiovascular;;  Distal RCA       Home Medications    Prior to Admission medications   Medication Sig Start Date End Date Taking? Authorizing Provider  amLODipine (NORVASC) 10 MG tablet Take 1 tablet (10 mg total) by mouth daily. 04/14/16   Love, Evlyn Kanner, PA-C  aspirin EC 325 MG tablet Take 1 tablet (325 mg total) by mouth daily. 09/07/16   Marvel Plan, MD  atorvastatin (LIPITOR) 80 MG tablet Take 1 tablet (80 mg total) by mouth daily at 6 PM. 04/14/16   Love, Evlyn Kanner, PA-C  docusate sodium (COLACE) 100 MG capsule Take 1 capsule (100 mg total) by mouth every 12 (twelve) hours. 04/26/16   Arby Barrette, MD  fluticasone (FLONASE) 50 MCG/ACT nasal spray Place 2 sprays into both nostrils daily. 04/01/16   Kathlen Mody, MD  folic acid (FOLVITE) 1 MG tablet Take 1 tablet (1 mg total) by mouth daily. 04/14/16   Love, Evlyn Kanner, PA-C  hydrALAZINE (APRESOLINE) 50 MG tablet Take 1.5 tablets (75 mg total) by mouth 3 (three) times daily. 04/14/16   Love, Evlyn Kanner, PA-C  metoprolol (LOPRESSOR) 100 MG tablet Take 1 tablet (100 mg total) by mouth 2 (two) times daily.  04/14/16   Love, Evlyn Kanner, PA-C  Multiple Vitamin (MULTIVITAMIN WITH MINERALS) TABS tablet Take 1 tablet by mouth daily. 04/01/16   Kathlen Mody, MD  nitroGLYCERIN (NITROSTAT) 0.4 MG SL tablet Place 1 tablet (0.4 mg total) under the tongue every 5 (five) minutes as needed for chest pain (up to 3 doses). 04/14/12   Hope, Jessica A, PA-C  potassium chloride 20 MEQ/15ML (10%) SOLN Take 7.5 mLs (10 mEq total) by mouth daily. 04/15/16   Love, Evlyn Kanner, PA-C  spironolactone (ALDACTONE) 25 MG tablet Take 1 tablet (25 mg total) by mouth daily. 04/14/16   Love, Evlyn Kanner, PA-C  thiamine 100 MG tablet Take 1 tablet (100 mg total) by mouth daily. 04/14/16   LoveEvlyn Kanner, PA-C    Family History  Family History  Problem Relation Age of Onset  . Heart attack Father   . Hypertension Father   . Cancer Mother   . Stroke Sister   . Hypertension Mother   . Hypertension Sister   . Hypertension Brother     Social History Social History  Substance Use Topics  . Smoking status: Current Every Day Smoker    Packs/day: 0.50    Years: 40.00    Types: Cigarettes  . Smokeless tobacco: Never Used  . Alcohol use Yes     Comment: "bourbon"     Allergies   Lisinopril   Review of Systems Review of Systems  HENT: Positive for nosebleeds.      Physical Exam Updated Vital Signs BP (!) 151/82   Pulse (!) 52   Temp 98.2 F (36.8 C) (Oral)   Ht 5\' 5"  (1.651 m)   Wt 58.5 kg (129 lb)   SpO2 100%   BMI 21.47 kg/m   Physical Exam  Constitutional: He appears well-developed and well-nourished.  HENT:  Head: Normocephalic and atraumatic.  Nose: Epistaxis is observed.  Mouth/Throat: Oropharynx is clear and moist.  Right Darene Lamer shows a minimal bleeding to the anterior medial aspect of Darene Lamer. No evidence of epistaxis in the left [place]. Posterior oropharynx is clear. No evidence of blood or clots.  Eyes: Conjunctivae and EOM are normal. Right eye exhibits no discharge. Left eye exhibits no discharge. No scleral  icterus.  Pulmonary/Chest: Effort normal.  No evidence of respiratory distress. Able to speak in full sentences without difficulty.  Neurological: He is alert.  Skin: Skin is warm and dry.  Psychiatric: He has a normal mood and affect. His speech is normal and behavior is normal.  Nursing note and vitals reviewed.    ED Treatments / Results  Labs (all labs ordered are listed, but only abnormal results are displayed) Labs Reviewed - No data to display  EKG  EKG Interpretation None       Radiology No results found.  Procedures Procedures (including critical care time)  Medications Ordered in ED Medications  oxymetazoline (AFRIN) 0.05 % nasal spray 1 spray (1 spray Each Nare Given 10/26/16 1610)     Initial Impression / Assessment and Plan / ED Course  I have reviewed the triage vital signs and the nursing notes.  Pertinent labs & imaging results that were available during my care of the patient were reviewed by me and considered in my medical decision making (see chart for details).     66 year old male who presents with epistaxis that began at 2 AM and resolved on its own. Returned at 6 AM. Patient is currently on aspirin daily but no other blood thinners. He denies any trauma to the nose. Patient is afebrile, non-toxic appearing, sitting comfortably on examination table. Will signs reviewed. Patient is slightly hypertensive. Patient has a history of hypertension and states he is compliant with his medication. He did not take his blood pressure medication this morning prior to coming to the ED. Suspect this is why his blood pressure is elevated. Do not suspect this to be a hypertensive urgency/emergency. Physical exam is consistent with anterior epistaxis. Afrin given on initial ED arrival. Will plan to apply pressure and reevaluate.   Reevaluation of her Afrin. Nosebleed has improved patient feels like it is returning. Will continue to monitor.  8:05 AM: Patient is sitting  comfortably on the examination table. No signs of active bleeding. He has not had any more epistaxis while here  in the department. Instructed proper technique for blood pressure in case this returns. Instructed patient to call his primary care doctor next 24-48 hours to arrange an appointment for further evaluation. Additionally told him to follow up with his primary care doctor regarding his high blood pressure. Strict return precautions discussed. Patient expresses understanding and agreement to plan.  Final Clinical Impressions(s) / ED Diagnoses   Final diagnoses:  Epistaxis    New Prescriptions Discharge Medication List as of 10/26/2016  8:08 AM       Maxwell Caul, PA-C 10/26/16 0837    Ward, Layla Maw, DO 10/26/16 2311

## 2016-10-26 NOTE — ED Triage Notes (Signed)
Pt states his nose has been bleeding off and on all day and states he is not sure why. He states he does not take any blood thinning medications. BP at triage 148/103 and states he took his BP meds this morning. No active bleeding at this time.

## 2016-10-27 NOTE — ED Provider Notes (Signed)
[facility] Provider Note   CSN: [id number] Arrival date & time: 10/26/16  1931  By signing my name below, I, [name], attest that this documentation has been prepared under the direction and in the presence of [name], Canary Brim, MD. Electronically Signed: [name], ED Scribe. 10/27/16. 12:25 AM.  History   Chief Complaint Chief Complaint  Patient presents with  . Epistaxis    HPI Randall Patel is a 66 y.o. male who presents to the Emergency Department complaining of intermittent, nosebleeds since Sunday, 10/24/16. His nose has been bleeding off and on all day, and he is unsure why. He was seen this morning, had his nose clamped and told to return if bleeding started again. Pt's nose is not currently bleeding. Lying on his side exacerbates the bleeding. He is not on any blood thinners. Pt denies fever or any other sx at this time.  The history is provided by the patient. No language interpreter was used.    Past Medical History:  Diagnosis Date  . CAD (coronary artery disease)    a. prior MI in 2006 b. 03/2012: 30% ostial LM stenosis, 30% pLAD stenosis, 60% mid-LADm 30% pCx, 3rd OM which was small in size with diffuse 90% stenosis involving both branches, and RCA with 50% mid-stenosis and 100% distal occlusion. POBA to PL branch and DES placement to dRCA.  . Claudication (HCC)   . Coronary artery disease    MI in 2008 with PCI to ? vessel; NSTEMI 03/2012; NSTEMI 04/2012 Cath showed triple vessel dz including occluded RCA, s/p DES to distal RCA, PTCA posterolateral branch   . CVA (cerebral infarction)    03/2012 with left arm weakness/numbness and left facial droop.  [name] ETOH abuse    quit 04/06/12  . ETOH abuse   . History of stroke 03/2015   right frontal hemorrhage  . HTN (hypertension)   . Hyperlipidemia   . Hypertension   . Ischemic cardiomyopathy    04/2012 EF 35-40%  . Ischemic cardiomyopathy    a. EF 40-45% by echo in 03/2015  . Seizures (HCC)     . Stroke (HCC)   . Tobacco abuse    quit 04/06/12  . Tobacco abuse   . Withdrawal seizures [facility])     Patient Active Problem List   Diagnosis Date Noted  . Anoxic brain injury (HCC) 03/31/2016  . Pulseless electrical activity (HCC)   . Chronic obstructive pulmonary disease (HCC)   . Dysphagia, post-stroke   . Abnormal CT of brain   . Coronary artery disease involving native coronary artery of native heart without angina pectoris   . ETOH abuse   . Tachypnea   . Benign essential HTN   . Hyperglycemia   . Pain   . Leukocytosis   . Acute pulmonary edema (HCC)   . Respiratory failure (HCC)   . Sinus tachycardia   . Cardiac arrest (HCC) 03/23/2016  . Palpitations 08/15/2015  . History of stroke 08/15/2015  . Anterior cerebral circulation hemorrhagic infarction (HCC) 08/15/2015  . Hyperlipidemia 08/15/2015  . Muscle stiffness   . Cough   . Hemiparesis affecting left side as late effect of stroke (HCC)   . Dysphagia as late effect of cerebrovascular disease   . Essential hypertension   . Acute blood loss anemia   . 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 Dysphagia   . Dysarthria due to cerebrovascular accident   . Diastolic dysfunction   . Tobacco  abuse   . ETOH abuse   . History of CVA with residual deficit   . Coronary artery disease involving native coronary artery of native heart with angina pectoris (HCC)   . Tachypnea   . Hypernatremia   . Hypokalemia   . Thrombocytopenia (HCC)   . Intracranial hemorrhage (HCC)   . Essential hypertension, malignant   . ICH (intracerebral hemorrhage) (HCC) 04/04/2015  . STEMI (ST elevation myocardial infarction) (HCC) 04/14/2012  . NSTEMI (non-ST elevated myocardial infarction) (HCC) 04/14/2012  . Cardiomyopathy, ischemic 04/14/2012  . Cerebral embolism with cerebral infarction (HCC) 04/03/2012  . Unspecified transient cerebral ischemia 04/02/2012  . Hemiplegia, unspecified, affecting nondominant  side 04/02/2012  . CAD (coronary artery disease) s/p stent 2008 in [place] 04/02/2012  . Hypertension     Past Surgical History:  Procedure Laterality Date  . CORONARY ANGIOPLASTY WITH STENT PLACEMENT  03/2012  . HERNIA REPAIR    . LEFT HEART CATHETERIZATION WITH CORONARY ANGIOGRAM N/A 04/11/2012   Procedure: LEFT HEART CATHETERIZATION WITH CORONARY ANGIOGRAM;  Surgeon: Kathleene Hazel, MD;  Location: [facility] CATH LAB;  Service: Cardiovascular;  Laterality: N/A;  . PERCUTANEOUS CORONARY INTERVENTION-BALLOON ONLY  04/11/2012   Procedure: PERCUTANEOUS CORONARY INTERVENTION-BALLOON ONLY;  Surgeon: Kathleene Hazel, MD;  Location: [facility] CATH LAB;  Service: Cardiovascular;;  RPLA  . PERCUTANEOUS CORONARY STENT INTERVENTION (PCI-S)  04/11/2012   Procedure: PERCUTANEOUS CORONARY STENT INTERVENTION (PCI-S);  Surgeon: Kathleene Hazel, MD;  Location: [facility] CATH LAB;  Service: Cardiovascular;;  Distal RCA       Home Medications    Prior to Admission medications   Medication Sig Start Date End Date Taking? Authorizing Provider  amLODipine (NORVASC) 10 MG tablet Take 1 tablet (10 mg total) by mouth daily. 04/14/16   Love, Evlyn Kanner, PA-C  aspirin EC 325 MG tablet Take 1 tablet (325 mg total) by mouth daily. 09/07/16   Marvel Plan, MD  atorvastatin (LIPITOR) 80 MG tablet Take 1 tablet (80 mg total) by mouth daily at 6 PM. 04/14/16   Love, Evlyn Kanner, PA-C  docusate sodium (COLACE) 100 MG capsule Take 1 capsule (100 mg total) by mouth every 12 (twelve) hours. 04/26/16   Arby Barrette, MD  fluticasone (FLONASE) 50 MCG/ACT nasal spray Place 2 sprays into both nostrils daily. 04/01/16   Kathlen Mody, MD  folic acid (FOLVITE) 1 MG tablet Take 1 tablet (1 mg total) by mouth daily. 04/14/16   Love, Evlyn Kanner, PA-C  hydrALAZINE (APRESOLINE) 50 MG tablet Take 1.5 tablets (75 mg total) by mouth 3 (three) times daily. 04/14/16   Love, Evlyn Kanner, PA-C  metoprolol (LOPRESSOR) 100 MG tablet Take 1 tablet (100 mg total)  by mouth 2 (two) times daily. 04/14/16   Love, Evlyn Kanner, PA-C  Multiple Vitamin (MULTIVITAMIN WITH MINERALS) TABS tablet Take 1 tablet by mouth daily. 04/01/16   Kathlen Mody, MD  nitroGLYCERIN (NITROSTAT) 0.4 MG SL tablet Place 1 tablet (0.4 mg total) under the tongue every 5 (five) minutes as needed for chest pain (up to 3 doses). 04/14/12   Hope, Jessica A, PA-C  potassium chloride 20 MEQ/15ML (10%) SOLN Take 7.5 mLs (10 mEq total) by mouth daily. 04/15/16   Love, Evlyn Kanner, PA-C  spironolactone (ALDACTONE) 25 MG tablet Take 1 tablet (25 mg total) by mouth daily. 04/14/16   Love, Evlyn Kanner, PA-C  thiamine 100 MG tablet Take 1 tablet (100 mg total) by mouth daily. 04/14/16   LoveEvlyn Kanner, PA-C    Family History Family  History  Problem Relation Age of Onset  . Heart attack Father   . Hypertension Father   . Cancer Mother   . Stroke Sister   . Hypertension Mother   . Hypertension Sister   . Hypertension Brother     Social History Social History  Substance Use Topics  . Smoking status: Current Every Day Smoker    Packs/day: 0.50    Years: 40.00    Types: Cigarettes  . Smokeless tobacco: Never Used  . Alcohol use Yes     Comment: "bourbon"     Allergies   Lisinopril   Review of Systems Review of Systems  Constitutional: Negative for fever.  HENT: Positive for nosebleeds.   All other systems reviewed and are negative.    Physical Exam Updated Vital Signs BP (!) 137/91 (BP Location: Right Arm)   Pulse 63   Temp 98.7 F (37.1 C) (Oral)   Resp 18   SpO2 97%   Physical Exam  Constitutional: He is oriented to person, place, and time. He appears well-developed and well-nourished. No distress.  HENT:  Head: Normocephalic and atraumatic.  Right Ear: Hearing normal.  Left Ear: Hearing normal.  Nose: Nose normal.  Mouth/Throat: Oropharynx is clear and moist and mucous membranes are normal.  Dilated vessels anterior septum bilaterally.   Eyes: Pupils are equal, round, and  reactive to light. Conjunctivae and EOM are normal.  Neck: Normal range of motion. Neck supple.  Cardiovascular: Regular rhythm, S1 normal and S2 normal.  Exam reveals no gallop and no friction rub.   No murmur heard. Pulmonary/Chest: Effort normal and breath sounds normal. No respiratory distress. He exhibits no tenderness.  Abdominal: Soft. Normal appearance and bowel sounds are normal. There is no hepatosplenomegaly. There is no tenderness. There is no rebound, no guarding, no tenderness at McBurney's point and negative Murphy's sign. No hernia.  Musculoskeletal: Normal range of motion.  Neurological: He is alert and oriented to person, place, and time. He has normal strength. No cranial nerve deficit or sensory deficit. Coordination normal. GCS eye subscore is 4. GCS verbal subscore is 5. GCS motor subscore is 6.  Skin: Skin is warm, dry and intact. No rash noted. No cyanosis.  Psychiatric: He has a normal mood and affect. His speech is normal and behavior is normal. Thought content normal.  Nursing note and vitals reviewed.    ED Treatments / Results  DIAGNOSTIC STUDIES: Oxygen Saturation is 97% on RA, normal by my interpretation.   COORDINATION OF CARE: 12:25 AM-Discussed next steps with pt. Pt verbalized understanding and is agreeable with the plan.   Labs (all labs ordered are listed, but only abnormal results are displayed) Labs Reviewed  I-STAT CHEM 8, ED - Abnormal; Notable for the following:       Result Value   Potassium 3.2 (*)    Glucose, Bld 129 (*)    All other components within normal limits    EKG  EKG Interpretation None       Radiology No results found.  Procedures Procedures (including critical care time)  Medications Ordered in ED Medications - No data to display   Initial Impression / Assessment and Plan / ED Course  I have reviewed the triage vital signs and the nursing notes.  Pertinent labs & imaging results that were available during my  care of the patient were reviewed by me and considered in my medical decision making (see chart for details).     Patient presents to the  ER with recurrent epistaxis. Patient seen yesterday and treated with direct pressure. He reports that he had repeat bleeding from both sides of the nose earlier tonight. At arrival to the ER, however, it has stopped. Patient was monitored for an extended period of time and there has not been any further bleeding. I do not see any obvious source of the bleeding, no possibility for prophylactic cauterization. Patient will need to follow-up with ENT for recheck.  Final Clinical Impressions(s) / ED Diagnoses   Final diagnoses:  Epistaxis    New Prescriptions New Prescriptions   No medications on file   I personally performed the services described in this documentation, which was scribed in my presence. The recorded information has been reviewed and is accurate.     Gilda Crease, MD 10/27/16 [phone]

## 2016-10-27 NOTE — ED Notes (Signed)
Pt is in no distress at this time.   

## 2017-02-14 ENCOUNTER — Ambulatory Visit: Payer: Medicare HMO | Admitting: Cardiovascular Disease

## 2017-02-14 NOTE — Progress Notes (Deleted)
No chief complaint on file.    History of Present Illness: 66 yo male with history of CAD, CVA , ischemic cardiomyopathy, etoh abuse, seizures here today for cardiac follow up. He had his first MI in 2008. He was admitted with bradycardia, chest pain and taken to cath lab as urgent NSTEMI 04/11/12 and was found to have a totally occluded distal RCA. This was treated with a drug eluting stent in the mid to distal RCA and angioplasty of the Posterolateral segment. His LVEF post MI was 35-40%. [facility] was [facility] to [facility] 2016 with right frontoparietal IC hemorrhage in the setting of uncontrolled HTN. Echo during that admission demonstrated LVEF 40-45%.  There was concern for right frontal encephalomalacia and concern for embolic infarcts. Outpatient event monitor planned. He was discharged to inpatient rehabilitation and seen in follow up in our office March 2017. Repeat imaging demonstrated resolution of IC hemorrhage. ASA started in Neurology clinic. Cardiac event monitor May 2017 with sinus rhythm, no atrial fibrillation or heart block. [facility] with PEA arrest and found to have CHF and encephalopathy. Echo December 2017 with LVEF=45-50%. Mild MR  He is here today for follow up. The patient denies any chest pain, dyspnea, palpitations, lower extremity edema, orthopnea, PND, dizziness, near syncope or syncope.    Primary Care Physician: Grayce SessionsEdwards, Michelle P, NP   Past Medical History:  Diagnosis Date  . CAD (coronary artery disease)    a. prior MI in 2006 b. 03/2012: 30% ostial LM stenosis, 30% pLAD stenosis, 60% mid-LADm 30% pCx, 3rd OM which was small in size with diffuse 90% stenosis involving both branches, and RCA with 50% mid-stenosis and 100% distal occlusion. POBA to PL branch and DES placement to dRCA.  . Claudication (HCC)   . Coronary artery disease    MI in 2008 with PCI to ? vessel; NSTEMI 03/2012; NSTEMI 04/2012 Cath showed triple vessel dz including  occluded RCA, s/p DES to distal RCA, PTCA posterolateral branch   . CVA (cerebral infarction)    03/2012 with left arm weakness/numbness and left facial droop.  [name]. ETOH abuse    quit 04/06/12  . ETOH abuse   . History of stroke 03/2015   right frontal hemorrhage  . HTN (hypertension)   . Hyperlipidemia   . Hypertension   . Ischemic cardiomyopathy    04/2012 EF 35-40%  . Ischemic cardiomyopathy    a. EF 40-45% by echo in 03/2015  . Seizures (HCC)   . Stroke (HCC)   . Tobacco abuse    quit 04/06/12  . Tobacco abuse   . Withdrawal seizures [facility](HCC)     Past Surgical History:  Procedure Laterality Date  . CORONARY ANGIOPLASTY WITH STENT PLACEMENT  03/2012  . HERNIA REPAIR      Current Outpatient Medications  Medication Sig Dispense Refill  . amLODipine (NORVASC) 10 MG tablet Take 1 tablet (10 mg total) by mouth daily. 30 tablet 0  . aspirin EC 325 MG tablet Take 1 tablet (325 mg total) by mouth daily. 30 tablet 0  . atorvastatin (LIPITOR) 80 MG tablet Take 1 tablet (80 mg total) by mouth daily at 6 PM. 30 tablet 0  . docusate sodium (COLACE) 100 MG capsule Take 1 capsule (100 mg total) by mouth every 12 (twelve) hours. 60 capsule 0  . fluticasone (FLONASE) 50 MCG/ACT nasal spray Place 2 sprays into both nostrils daily.  2  . folic acid (FOLVITE) 1 MG tablet Take 1 tablet (  1 mg total) by mouth daily. 30 tablet 0  . hydrALAZINE (APRESOLINE) 50 MG tablet Take 1.5 tablets (75 mg total) by mouth 3 (three) times daily. 135 tablet 0  . metoprolol (LOPRESSOR) 100 MG tablet Take 1 tablet (100 mg total) by mouth 2 (two) times daily. 60 tablet 0  . Multiple Vitamin (MULTIVITAMIN WITH MINERALS) TABS tablet Take 1 tablet by mouth daily.    . nitroGLYCERIN (NITROSTAT) 0.4 MG SL tablet Place 1 tablet (0.4 mg total) under the tongue every 5 (five) minutes as needed for chest pain (up to 3 doses). 25 tablet 3  . potassium chloride 20 MEQ/15ML (10%) SOLN Take 7.5 mLs (10 mEq total) by mouth daily. 900  mL 0  . spironolactone (ALDACTONE) 25 MG tablet Take 1 tablet (25 mg total) by mouth daily. 30 tablet 0  . thiamine 100 MG tablet Take 1 tablet (100 mg total) by mouth daily. 30 tablet 0   No current facility-administered medications for this visit.     Allergies  Allergen Reactions  . Lisinopril Swelling and Other (See Comments)    angioedema    Social History   Socioeconomic History  . Marital status: Single    Spouse name: Not on file  . Number of children: Not on file  . Years of education: Not on file  . Highest education level: Not on file  Social Needs  . Financial resource strain: Not on file  . Food insecurity - worry: Not on file  . Food insecurity - inability: Not on file  . Transportation needs - medical: Not on file  . Transportation needs - non-medical: Not on file  Occupational History  . Not on file  Tobacco Use  . Smoking status: Current Every Day Smoker    Packs/day: 0.50    Years: 40.00    Pack years: 20.00    Types: Cigarettes  . Smokeless tobacco: Never Used  Substance and Sexual Activity  . Alcohol use: Yes    Comment: "bourbon"  . Drug use: No  . Sexual activity: No  Other Topics Concern  . Not on file  Social History Narrative   ** Merged History Encounter **        Family History  Problem Relation Age of Onset  . Heart attack Father   . Hypertension Father   . Cancer Mother   . Stroke Sister   . Hypertension Mother   . Hypertension Sister   . Hypertension Brother     Review of Systems:  As stated in the HPI and otherwise negative.   There were no vitals taken for this visit.  Physical Examination:  General: Well developed, well nourished, NAD  HEENT: OP clear, mucus membranes moist  SKIN: warm, dry. No rashes. Neuro: No focal deficits  Musculoskeletal: Muscle strength 5/5 all ext  Psychiatric: Mood and affect normal  Neck: No JVD, no carotid bruits, no thyromegaly, no lymphadenopathy.  Lungs:Clear bilaterally, no  wheezes, rhonci, crackles Cardiovascular: Regular rate and rhythm. No murmurs, gallops or rubs. Abdomen:Soft. Bowel sounds present. Non-tender.  Extremities: No lower extremity edema. Pulses are 2 + in the bilateral DP/PT.  Echo: 03/26/16: Left ventricle: The cavity size was normal. There was mild   concentric hypertrophy. Systolic function was mildly reduced. The   estimated ejection fraction was in the range of 45% to 50%.   Hypokinesis of the inferior and inferoseptal myocardium. Doppler   parameters are consistent with abnormal left ventricular   relaxation (grade 1 diastolic dysfunction). -  Aortic valve: There was no regurgitation. - Mitral valve: Transvalvular velocity was within the normal range.   There was no evidence for stenosis. There was mild regurgitation   directed posteriorly. - Right ventricle: The cavity size was normal. Wall thickness was   normal. Systolic function was normal. - Tricuspid valve: There was mild regurgitation. - Pulmonary arteries: Systolic pressure was moderately increased.   PA peak pressure: 51 mm Hg (S). - Pericardium, extracardiac: A trivial pericardial effusion was   identified.  Cardiac cath 04/11/12: Left main: Ostial 30% stenosis with heavy calcification.  Left Anterior Descending Artery: Large caliber vessel that courses to the apex. The proximal vessel has 30% stenosis. The mid vessel has a 60% stenosis at the takeoff of a septal perforating branch. Moderate caliber diagonal branch with mild plaque disease.  Circumflex Artery: Moderate caliber vessel. The proximal vessel has diffuse 30% stenosis. The first 2 obtuse marginal branches are very small. The third obtuse marginal branch is a small bifurcating vessel with diffuse 90% stenosis involving both branches. (1.75 mm vessel)  Right Coronary Artery: Large, dominant artery with 50% mid stenosis and 100% distal occlusion.  Left Ventricular Angiogram: LVEF=30-35% with akinesis of the inferior  wall.  EKG:  EKG is not *** ordered today. The ekg ordered today demonstrates   Recent Labs: 03/23/2016: B Natriuretic Peptide 540.2 03/30/2016: Magnesium 2.2 04/01/2016: ALT 21 04/13/2016: Platelets 336 10/26/2016: BUN 9; Creatinine, Ser 0.80; Hemoglobin 13.6; Potassium 3.2; Sodium 139   Lipid Panel    Component Value Date/Time   CHOL 179 04/07/2015 0203   TRIG 113 03/25/2016 0325   HDL 96 04/07/2015 0203   CHOLHDL 1.9 04/07/2015 0203   VLDL 13 04/07/2015 0203   LDLCALC 70 04/07/2015 0203     Wt Readings from Last 3 Encounters:  10/26/16 129 lb (58.5 kg)  09/07/16 129 lb 3.2 oz (58.6 kg)  06/24/16 136 lb 6.4 oz (61.9 kg)     Other studies Reviewed: Additional studies/ records that were reviewed today include: . Review of the above records demonstrates:    Assessment and Plan:   1. CAD without angina: No chest pain suggestive of angina. Continue ASA, statin and beta blocker.     2. Ischemic cardiomyopathy: LVEF 45-50% by echo December 2017. Will continue beta blocker, aldactone, hydralazine.  3. Tobacco abuse: Smoking cessation advised.   4. HTN: BP is ***  5. Hyperlipidemia: Lipids last checked in [facility] system in 2016. Will continue statin. Check lipids now ***  6. History of CVA  Current medicines are reviewed at length with the patient today.  The patient does not have concerns regarding medicines.  The following changes have been made:  no change  Labs/ tests ordered today include:  No orders of the defined types were placed in this encounter.   Disposition:   FU with me in 6 months  Signed, Verne Carrow, MD 02/14/2017 12:28 PM    [facility] [address] [place], [place], [place]  [address] Phone: [phone]; Fax: [phone]

## 2017-02-15 ENCOUNTER — Encounter: Payer: Self-pay | Admitting: Cardiovascular Disease

## 2017-03-09 NOTE — Progress Notes (Deleted)
[facility]  PATIENT: Randall Patel DOB: 08/07/1950   REASON FOR VISIT: *** HISTORY FROM:    HISTORY OF PRESENT ILLNESS:History Summary [name] is a 66 y.o. male with history of HTN, CAD with previous MI, previous CVA, ischemic cardiomyopathy, HLD, PVD, tobacco use, and alcohol abuse admitted on 04/04/15 for left hemiparesis and left facial droop. CT revealed large R frontoparietal hemorrhage. Started on scheduled mannitol and admitted to ICU. Mannitol changed to 3% with cerebral edema. Repeat CT stable. CUS unremarkable. TTE showed EF 35-40%. LDL 86 and A1C 6.4. BP gradually controlled. Continued improvement with discharge from ICU to IP rehab. Due to hx of stroke and low EF, concerning for hemorrhagic infarct. 30 day cardiac event monitoring recommended on discharge.  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 He stayed in CIR for about 2 weeks and then d/c home. BP much better controlled, BP 144/96. On coreg, hydralazine, losartan, and spironolactone. He is also on lipitor for HLD. He has not quit smoking yet but willing to use patch after discussion with cardiology this month. Repeat MRI and MRA on 07/31/15 showed resolution of hemorrahage, but also showed right M1 stenosis progressed from before, which consistent with multiple right MCA infarcts in the past.   He had ED visit on 08/13/15 due to left arm shaking, lasting 10 sec. Denies LOC, leg shaking or facial twitching. EMS called and he was sent to ED. Head CT no acute abnormalities. He was put on keppra 500mg  bid and discharged home.  Follow up 03/12/16 - pt has been doing the same. No seizure or stroke like symptoms. On ASA and lipitor and keppra. BP today 139/84. Has not followed up with cardiology yet for the low EF. Has not quit smoking  yet, but cutting down.   Interval History During the interval time, pt has been doing the same. He did not continue ASA at home as he did not buy OTC. He saw cardiology in 06/2016 and recommended to restart ASA. Repeat TTE showed improved EF. He has been off keppra and he does not know who took him off it. However, he did not have seizure except last Saturday he had some left arm tremor during a family re-union. It sounds more like clonus to me. His BP 115/75 today. He has not quit smoking yet but cut down.     REVIEW OF SYSTEMS: Full 14 system review of systems performed and notable only for those listed, all others are neg:  Constitutional: neg  Cardiovascular: neg Ear/Nose/Throat: neg  Skin: neg Eyes: neg Respiratory: neg Gastroitestinal: neg  Hematology/Lymphatic: neg  Endocrine: neg Musculoskeletal:neg Allergy/Immunology: neg Neurological: neg Psychiatric: neg Sleep : neg   ALLERGIES: Allergies  Allergen Reactions  . Lisinopril Swelling and Other (See Comments)    angioedema    HOME MEDICATIONS: Outpatient Medications Prior to Visit  Medication Sig Dispense Refill  . amLODipine (NORVASC) 10 MG tablet Take 1 tablet (10 mg total) by mouth daily. 30 tablet 0  . aspirin EC 325 MG tablet Take 1 tablet (325 mg total) by mouth daily. 30 tablet 0  . atorvastatin (LIPITOR) 80 MG tablet Take 1 tablet (80 mg total) by mouth daily at 6 PM. 30 tablet 0  . docusate sodium (COLACE) 100 MG capsule Take 1 capsule (100 mg total) by mouth every 12 (twelve)  hours. 60 capsule 0  . fluticasone (FLONASE) 50 MCG/ACT nasal spray Place 2 sprays into both nostrils daily.  2  . folic acid (FOLVITE) 1 MG tablet Take 1 tablet (1 mg total) by mouth daily. 30 tablet 0  . hydrALAZINE (APRESOLINE) 50 MG tablet Take 1.5 tablets (75 mg total) by mouth 3 (three) times daily. 135 tablet 0  . metoprolol (LOPRESSOR) 100 MG tablet Take 1 tablet (100 mg total) by mouth 2 (two) times daily. 60 tablet 0  .  Multiple Vitamin (MULTIVITAMIN WITH MINERALS) TABS tablet Take 1 tablet by mouth daily.    . nitroGLYCERIN (NITROSTAT) 0.4 MG SL tablet Place 1 tablet (0.4 mg total) under the tongue every 5 (five) minutes as needed for chest pain (up to 3 doses). 25 tablet 3  . potassium chloride 20 MEQ/15ML (10%) SOLN Take 7.5 mLs (10 mEq total) by mouth daily. 900 mL 0  . spironolactone (ALDACTONE) 25 MG tablet Take 1 tablet (25 mg total) by mouth daily. 30 tablet 0  . thiamine 100 MG tablet Take 1 tablet (100 mg total) by mouth daily. 30 tablet 0   No facility-administered medications prior to visit.     PAST MEDICAL HISTORY: Past Medical History:  Diagnosis Date  . CAD (coronary artery disease)    a. prior MI in 2006 b. 03/2012: 30% ostial LM stenosis, 30% pLAD stenosis, 60% mid-LADm 30% pCx, 3rd OM which was small in size with diffuse 90% stenosis involving both branches, and RCA with 50% mid-stenosis and 100% distal occlusion. POBA to PL branch and DES placement to dRCA.  . Claudication (HCC)   . Coronary artery disease    MI in 2008 with PCI to ? vessel; NSTEMI 03/2012; NSTEMI 04/2012 Cath showed triple vessel dz including occluded RCA, s/p DES to distal RCA, PTCA posterolateral branch   . CVA (cerebral infarction)    03/2012 with left arm weakness/numbness and left facial droop.  [name] ETOH abuse    quit 04/06/12  . ETOH abuse   . History of stroke 03/2015   right frontal hemorrhage  . HTN (hypertension)   . Hyperlipidemia   . Hypertension   . Ischemic cardiomyopathy    04/2012 EF 35-40%  . Ischemic cardiomyopathy    a. EF 40-45% by echo in 03/2015  . Seizures (HCC)   . Stroke (HCC)   . Tobacco abuse    quit 04/06/12  . Tobacco abuse   . Withdrawal seizures (HCC)     PAST SURGICAL HISTORY: Past Surgical History:  Procedure Laterality Date  . CORONARY ANGIOPLASTY WITH STENT PLACEMENT  03/2012  . HERNIA REPAIR    . LEFT HEART CATHETERIZATION WITH CORONARY ANGIOGRAM N/A 04/11/2012    Procedure: LEFT HEART CATHETERIZATION WITH CORONARY ANGIOGRAM;  Surgeon: Kathleene Hazel, MD;  Location: [facility] CATH LAB;  Service: Cardiovascular;  Laterality: N/A;  . PERCUTANEOUS CORONARY INTERVENTION-BALLOON ONLY  04/11/2012   Procedure: PERCUTANEOUS CORONARY INTERVENTION-BALLOON ONLY;  Surgeon: Kathleene Hazel, MD;  Location: [facility] CATH LAB;  Service: Cardiovascular;;  RPLA  . PERCUTANEOUS CORONARY STENT INTERVENTION (PCI-S)  04/11/2012   Procedure: PERCUTANEOUS CORONARY STENT INTERVENTION (PCI-S);  Surgeon: Kathleene Hazel, MD;  Location: [facility] CATH LAB;  Service: Cardiovascular;;  Distal RCA    FAMILY HISTORY: Family History  Problem Relation Age of Onset  . Heart attack Father   . Hypertension Father   . Cancer Mother   . Stroke Sister   . Hypertension Mother   . Hypertension Sister   .  Hypertension Brother     SOCIAL HISTORY: Social History   Socioeconomic History  . Marital status: Single    Spouse name: Not on file  . Number of children: Not on file  . Years of education: Not on file  . Highest education level: Not on file  Social Needs  . Financial resource strain: Not on file  . Food insecurity - worry: Not on file  . Food insecurity - inability: Not on file  . Transportation needs - medical: Not on file  . Transportation needs - non-medical: Not on file  Occupational History  . Not on file  Tobacco Use  . Smoking status: Current Every Day Smoker    Packs/day: 0.50    Years: 40.00    Pack years: 20.00    Types: Cigarettes  . Smokeless tobacco: Never Used  Substance and Sexual Activity  . Alcohol use: Yes    Comment: "bourbon"  . Drug use: No  . Sexual activity: No  Other Topics Concern  . Not on file  Social History Narrative   ** Merged History Encounter **         PHYSICAL EXAM  There were no vitals filed for this visit. There is no height or weight on file to calculate BMI.  Generalized: Well developed, in no acute distress    Head: normocephalic and atraumatic,. Oropharynx benign  Neck: Supple, no carotid bruits  Cardiac: Regular rate rhythm, no murmur  Musculoskeletal: No deformity   Neurological examination   Mentation: Alert oriented to time, place, history taking. Attention span and concentration appropriate. Recent and remote memory intact.  Follows all commands speech and language fluent.   Cranial nerve II-XII: Fundoscopic exam reveals sharp disc margins.Pupils were equal round reactive to light extraocular movements were full, visual field were full on confrontational test. Facial sensation and strength were normal. hearing was intact to finger rubbing bilaterally. Uvula tongue midline. head turning and shoulder shrug were normal and symmetric.Tongue protrusion into cheek strength was normal. Motor: normal bulk and tone, full strength in the BUE, BLE, fine finger movements normal, no pronator drift. No focal weakness Sensory: normal and symmetric to light touch, pinprick, and  Vibration, proprioception  Coordination: finger-nose-finger, heel-to-shin bilaterally, no dysmetria Reflexes: Brachioradialis 2/2, biceps 2/2, triceps 2/2, patellar 2/2, Achilles 2/2, plantar responses were flexor bilaterally. Gait and Station: Rising up from seated position without assistance, normal stance,  moderate stride, good arm swing, smooth turning, able to perform tiptoe, and heel walking without difficulty. Tandem gait is steady  DIAGNOSTIC DATA (LABS, IMAGING, TESTING) - I reviewed patient records, labs, notes, testing and imaging myself where available.  Lab Results  Component Value Date   WBC 5.2 04/13/2016   HGB 13.6 10/26/2016   HCT 40.0 10/26/2016   MCV 84.5 04/13/2016   PLT 336 04/13/2016      Component Value Date/Time   NA 139 10/26/2016 2025   K 3.2 (L) 10/26/2016 2025   CL 102 10/26/2016 2025   CO2 22 04/13/2016 1451   GLUCOSE 129 (H) 10/26/2016 2025   BUN 9 10/26/2016 2025   CREATININE 0.80  10/26/2016 2025   CREATININE 0.90 08/01/2015 1333   CALCIUM 10.4 (H) 04/13/2016 1451   PROT 7.8 04/01/2016 0618   ALBUMIN 3.5 04/01/2016 0618   AST 22 04/01/2016 0618   ALT 21 04/01/2016 0618   ALKPHOS 46 04/01/2016 0618   BILITOT 0.9 04/01/2016 0618   GFRNONAA >60 04/13/2016 1451   GFRAA >60 04/13/2016 1451  Lab Results  Component Value Date   CHOL 179 04/07/2015   HDL 96 04/07/2015   LDLCALC 70 04/07/2015   TRIG 113 03/25/2016   CHOLHDL 1.9 04/07/2015   Lab Results  Component Value Date   HGBA1C 6.1 (H) 04/07/2015   Lab Results  Component Value Date   VITAMINB12 491 04/07/2015   Lab Results  Component Value Date   TSH 0.557 04/07/2015    ***  ASSESSMENT AND PLAN  66 y.o. year old male  has a past medical history of CAD (coronary artery disease), Claudication (HCC), Coronary artery disease, CVA (cerebral infarction), ETOH abuse, ETOH abuse, History of stroke (03/2015), HTN (hypertension), Hyperlipidemia, Hypertension, Ischemic cardiomyopathy, Ischemic cardiomyopathy, Seizures (HCC), Stroke (HCC), Tobacco abuse, Tobacco abuse, and Withdrawal seizures (HCC). here with ***  66 y.o. African American male with PMH of HTN, CAD with previous MI, previous CVA, ischemic cardiomyopathy, HLD, PVD, tobacco use, and alcohol abuse admitted on 04/04/15 for large R frontoparietal hemorrhage. Treated with 3% saline for cerebral edema. Repeat CT stable. CUS unremarkable. TTE showed EF 35-40%. LDL 86 and A1C 6.4. BP gradually controlled. Continued improvement with discharge from ICU to IP rehab. During the interval time, has not quit smoking yet. Repeat MRI and MRA on 07/31/15 showed resolution of hemorrahage, but also showed right M1 stenosis progressed from before, which consistent with multiple right MCA infarcts in the past. His ICH also considered as hemorrhagic infarcts. 30 day cardiac event monitoring showed no afib. He had on 08/13/15 left arm shaking, lasting 10 sec, concerning for  partial seizure. He was put on keppra 500mg  bid. Since then, no more seizure like activities. However, his keppra was tapered off over the time, and he had no seizure except one episode sounds more like clonus at LUE. EEG in 03/2016 showed diffuse slowing. Still not quit smoking yet. Followed up with cardiology and repeat TTE showed EF 45-50%.  Plan:  - continue ASA 325mg  and lipitor for stroke prevention. Buy ASA over the counter will be cheaper. - check BP and record and bring over to PCP for medication adjustment if needed. goal 120-150 due to right M1 stenosis - follow up with cardiologist  - quit smoking completely - Follow up with your primary care physician for stroke risk factor modification. Recommend maintain blood pressure goal 120-150/70-90, diabetes with hemoglobin A1c goal below 6.5% and lipids with LDL cholesterol goal below 70 mg/dL.  - healthy diet and regular exercise - follow up in 6 months with carolyn. If stable, can be d/c from clinic.   Nilda RiggsNancy Carolyn Morrison Masser, [facility] KingsportGNP, [facility]BC, APRN  [facility] [address], [address] [place], [place] [address] [phone]

## 2017-03-10 ENCOUNTER — Ambulatory Visit: Payer: Medicare HMO | Admitting: Nurse Practitioner

## 2017-03-11 ENCOUNTER — Encounter: Payer: Self-pay | Admitting: Nurse Practitioner

## 2017-05-30 ENCOUNTER — Encounter: Payer: Self-pay | Admitting: Cardiovascular Disease

## 2017-05-30 ENCOUNTER — Ambulatory Visit (INDEPENDENT_AMBULATORY_CARE_PROVIDER_SITE_OTHER): Payer: Medicare HMO | Admitting: Cardiovascular Disease

## 2017-05-30 VITALS — BP 118/70 | HR 58 | Ht 64.0 in | Wt 133.0 lb

## 2017-05-30 DIAGNOSIS — E7800 Pure hypercholesterolemia, unspecified: Secondary | ICD-10-CM

## 2017-05-30 DIAGNOSIS — I10 Essential (primary) hypertension: Secondary | ICD-10-CM

## 2017-05-30 DIAGNOSIS — I2510 Atherosclerotic heart disease of native coronary artery without angina pectoris: Secondary | ICD-10-CM | POA: Diagnosis not present

## 2017-05-30 DIAGNOSIS — Z720 Tobacco use: Secondary | ICD-10-CM | POA: Diagnosis not present

## 2017-05-30 DIAGNOSIS — I255 Ischemic cardiomyopathy: Secondary | ICD-10-CM | POA: Diagnosis not present

## 2017-05-30 NOTE — Progress Notes (Signed)
Chief Complaint  Patient presents with  . Coronary Artery Disease    History of Present Illness: 67 yo male with history of CAD, CVA , ischemic cardiomyopathy, etoh abuse, seizures here today for cardiac follow up. He had his first MI in 2008. He was admitted with bradycardia, chest pain and taken to cath lab as urgent NSTEMI 04/11/12 and was found to have a totally occluded distal RCA. This was treated with a drug eluting stent in the mid to distal RCA and angioplasty of the Posterolateral segment. His LVEF post MI was 35-40%. He was admitted to [facility] December 2016 with right frontoparietal IC hemorrhage in the setting of uncontrolled HTN. Echo during that admission demonstrated LVEF 40-45%.  There was concern for right frontal encephalomalacia and concern for embolic infarcts. Outpatient event monitor planned. He was discharged to inpatient rehabilitation and seen in follow up in our office March 2017. Repeat imaging demonstrated resolution of IC hemorrhage. ASA started in Neurology clinic. Cardiac event monitor May 2017 with sinus rhythm, no atrial fibrillation or heart block. Admitted to [facility] December 2017 with PEA arrest and found to have CHF and encephalopathy. Echo December 2017 with LVEF=45-50%. Mild MR.   He is here today for follow up. The patient denies any chest pain, dyspnea, palpitations, lower extremity edema, orthopnea, PND, dizziness, near syncope or syncope. He is getting around much better now. He is living with his sister.   Primary Care Physician: Grayce Sessions, NP   Past Medical History:  Diagnosis Date  . CAD (coronary artery disease)    a. prior MI in 2006 b. 03/2012: 30% ostial LM stenosis, 30% pLAD stenosis, 60% mid-LADm 30% pCx, 3rd OM which was small in size with diffuse 90% stenosis involving both branches, and RCA with 50% mid-stenosis and 100% distal occlusion. POBA to PL branch and DES placement to dRCA.  . Claudication (HCC)   . Coronary artery disease     MI in 2008 with PCI to ? vessel; NSTEMI 03/2012; NSTEMI 04/2012 Cath showed triple vessel dz including occluded RCA, s/p DES to distal RCA, PTCA posterolateral branch   . CVA (cerebral infarction)    03/2012 with left arm weakness/numbness and left facial droop.  [name] ETOH abuse    quit 04/06/12  . ETOH abuse   . History of stroke 03/2015   right frontal hemorrhage  . HTN (hypertension)   . Hyperlipidemia   . Hypertension   . Ischemic cardiomyopathy    04/2012 EF 35-40%  . Ischemic cardiomyopathy    a. EF 40-45% by echo in 03/2015  . Seizures (HCC)   . Stroke (HCC)   . Tobacco abuse    quit 04/06/12  . Tobacco abuse   . Withdrawal seizures [facility])     Past Surgical History:  Procedure Laterality Date  . CORONARY ANGIOPLASTY WITH STENT PLACEMENT  03/2012  . HERNIA REPAIR    . LEFT HEART CATHETERIZATION WITH CORONARY ANGIOGRAM N/A 04/11/2012   Procedure: LEFT HEART CATHETERIZATION WITH CORONARY ANGIOGRAM;  Surgeon: Kathleene Hazel, MD;  Location: [facility] CATH LAB;  Service: Cardiovascular;  Laterality: N/A;  . PERCUTANEOUS CORONARY INTERVENTION-BALLOON ONLY  04/11/2012   Procedure: PERCUTANEOUS CORONARY INTERVENTION-BALLOON ONLY;  Surgeon: Kathleene Hazel, MD;  Location: [facility] CATH LAB;  Service: Cardiovascular;;  RPLA  . PERCUTANEOUS CORONARY STENT INTERVENTION (PCI-S)  04/11/2012   Procedure: PERCUTANEOUS CORONARY STENT INTERVENTION (PCI-S);  Surgeon: Kathleene Hazel, MD;  Location: [facility] CATH LAB;  Service: Cardiovascular;;  Distal RCA  Current Outpatient Medications  Medication Sig Dispense Refill  . amLODipine (NORVASC) 10 MG tablet Take 1 tablet (10 mg total) by mouth daily. 30 tablet 0  . aspirin EC 325 MG tablet Take 1 tablet (325 mg total) by mouth daily. 30 tablet 0  . atorvastatin (LIPITOR) 80 MG tablet Take 1 tablet (80 mg total) by mouth daily at 6 PM. 30 tablet 0  . docusate sodium (COLACE) 100 MG capsule Take 1 capsule (100 mg total) by mouth every 12  (twelve) hours. 60 capsule 0  . fluticasone (FLONASE) 50 MCG/ACT nasal spray Place 2 sprays into both nostrils daily.  2  . folic acid (FOLVITE) 1 MG tablet Take 1 tablet (1 mg total) by mouth daily. 30 tablet 0  . hydrALAZINE (APRESOLINE) 50 MG tablet Take 1.5 tablets (75 mg total) by mouth 3 (three) times daily. 135 tablet 0  . metoprolol (LOPRESSOR) 100 MG tablet Take 1 tablet (100 mg total) by mouth 2 (two) times daily. 60 tablet 0  . Multiple Vitamin (MULTIVITAMIN WITH MINERALS) TABS tablet Take 1 tablet by mouth daily.    . nitroGLYCERIN (NITROSTAT) 0.4 MG SL tablet Place 1 tablet (0.4 mg total) under the tongue every 5 (five) minutes as needed for chest pain (up to 3 doses). 25 tablet 3  . potassium chloride 20 MEQ/15ML (10%) SOLN Take 7.5 mLs (10 mEq total) by mouth daily. 900 mL 0  . spironolactone (ALDACTONE) 25 MG tablet Take 1 tablet (25 mg total) by mouth daily. 30 tablet 0  . thiamine 100 MG tablet Take 1 tablet (100 mg total) by mouth daily. 30 tablet 0   No current facility-administered medications for this visit.     Allergies  Allergen Reactions  . Lisinopril Swelling and Other (See Comments)    angioedema    Social History   Socioeconomic History  . Marital status: Single    Spouse name: Not on file  . Number of children: Not on file  . Years of education: Not on file  . Highest education level: Not on file  Social Needs  . Financial resource strain: Not on file  . Food insecurity - worry: Not on file  . Food insecurity - inability: Not on file  . Transportation needs - medical: Not on file  . Transportation needs - non-medical: Not on file  Occupational History  . Not on file  Tobacco Use  . Smoking status: Current Every Day Smoker    Packs/day: 0.50    Years: 40.00    Pack years: 20.00    Types: Cigarettes  . Smokeless tobacco: Never Used  Substance and Sexual Activity  . Alcohol use: Yes    Comment: "bourbon"  . Drug use: No  . Sexual activity: No    Other Topics Concern  . Not on file  Social History Narrative   ** Merged History Encounter **        Family History  Problem Relation Age of Onset  . Heart attack Father   . Hypertension Father   . Cancer Mother   . Stroke Sister   . Hypertension Mother   . Hypertension Sister   . Hypertension Brother     Review of Systems:  As stated in the HPI and otherwise negative.   BP 118/70   Pulse (!) 58   Ht 5\' 4"  (1.626 m)   Wt 133 lb (60.3 kg)   SpO2 97%   BMI 22.83 kg/m   Physical Examination:  General: Well  developed, well nourished, NAD  HEENT: OP clear, mucus membranes moist  SKIN: warm, dry. No rashes. Neuro: No focal deficits  Musculoskeletal: Muscle strength 5/5 all ext  Psychiatric: Mood and affect normal  Neck: No JVD, no carotid bruits, no thyromegaly, no lymphadenopathy.  Lungs:Clear bilaterally, no wheezes, rhonci, crackles Cardiovascular: Regular rate and rhythm. No murmurs, gallops or rubs. Abdomen:Soft. Bowel sounds present. Non-tender.  Extremities: No lower extremity edema. Pulses are 2 + in the bilateral DP/PT.  Echo: 03/26/16: Left ventricle: The cavity size was normal. There was mild   concentric hypertrophy. Systolic function was mildly reduced. The   estimated ejection fraction was in the range of 45% to 50%.   Hypokinesis of the inferior and inferoseptal myocardium. Doppler   parameters are consistent with abnormal left ventricular   relaxation (grade 1 diastolic dysfunction). - Aortic valve: There was no regurgitation. - Mitral valve: Transvalvular velocity was within the normal range.   There was no evidence for stenosis. There was mild regurgitation   directed posteriorly. - Right ventricle: The cavity size was normal. Wall thickness was   normal. Systolic function was normal. - Tricuspid valve: There was mild regurgitation. - Pulmonary arteries: Systolic pressure was moderately increased.   PA peak pressure: 51 mm Hg (S). -  Pericardium, extracardiac: A trivial pericardial effusion was   identified.  Cardiac cath 04/11/12: Left main: Ostial 30% stenosis with heavy calcification.  Left Anterior Descending Artery: Large caliber vessel that courses to the apex. The proximal vessel has 30% stenosis. The mid vessel has a 60% stenosis at the takeoff of a septal perforating branch. Moderate caliber diagonal branch with mild plaque disease.  Circumflex Artery: Moderate caliber vessel. The proximal vessel has diffuse 30% stenosis. The first 2 obtuse marginal branches are very small. The third obtuse marginal branch is a small bifurcating vessel with diffuse 90% stenosis involving both branches. (1.75 mm vessel)  Right Coronary Artery: Large, dominant artery with 50% mid stenosis and 100% distal occlusion.  Left Ventricular Angiogram: LVEF=30-35% with akinesis of the inferior wall.  EKG:  EKG is not ordered today. The ekg ordered today demonstrates   Recent Labs: 10/26/2016: BUN 9; Creatinine, Ser 0.80; Hemoglobin 13.6; Potassium 3.2; Sodium 139   Lipid Panel    Component Value Date/Time   CHOL 179 04/07/2015 0203   TRIG 113 03/25/2016 0325   HDL 96 04/07/2015 0203   CHOLHDL 1.9 04/07/2015 0203   VLDL 13 04/07/2015 0203   LDLCALC 70 04/07/2015 0203     Wt Readings from Last 3 Encounters:  05/30/17 133 lb (60.3 kg)  10/26/16 129 lb (58.5 kg)  09/07/16 129 lb 3.2 oz (58.6 kg)     Other studies Reviewed: Additional studies/ records that were reviewed today include: . Review of the above records demonstrates:    Assessment and Plan:   1. CAD without angina: He has no chest pain suggestive of angina. Mild LV dysfunction by echo December 2017, unchanged. Will continue statin and beta blocker. He will discuss restarting ASA with Neurology.    2. Ischemic cardiomyopathy: LVEF 45-50% by echo December 2017. Continue beta blocker and aldactone.  Afterload reduction with hydralazine. Not clear why he is off of his  ARB. He is NYHA class 1.   3. Tobacco abuse: I have advised him to stop smoking. He does not wish to stop smoking.   4. HTN: BP is well controlled.  No changes  5. Hyperlipidemia: Lipids followed in primary care. Continue statin.  6. History of CVA: He is followed in Neurology.   Current medicines are reviewed at length with the patient today.  The patient does not have concerns regarding medicines.  The following changes have been made:  no change  Labs/ tests ordered today include:  No orders of the defined types were placed in this encounter.   Disposition:   FU with me in 12 months  Signed, Verne Carrow, MD 05/30/2017 4:20 PM    [facility] [address] [place], [place], [place]  [address] Phone: [phone]; Fax: [phone]

## 2017-05-30 NOTE — Patient Instructions (Signed)
Medication Instructions:  Your physician recommends that you continue on your current medications as directed. Please refer to the Current Medication list given to you today.   Labwork: None ordered  Testing/Procedures: None ordered  Follow-Up: Your physician wants you to follow-up in: 1 year with Dr. McAlhany. You will receive a reminder letter in the mail two months in advance. If you don't receive a letter, please call our office to schedule the follow-up appointment.   Any Other Special Instructions Will Be Listed Below (If Applicable).     If you need a refill on your cardiac medications before your next appointment, please call your pharmacy.   

## 2017-06-06 ENCOUNTER — Ambulatory Visit: Payer: Medicare HMO | Admitting: Nurse Practitioner

## 2017-07-18 NOTE — Progress Notes (Signed)
[facility]  PATIENT: Randall Patel DOB: September 27, 1950   REASON FOR VISIT: follow up for stroke Dec 2016 HISTORY FROM:patient    HISTORY OF PRESENT ILLNESS: Randall Patel is a 67 y.o. male with history of HTN, CAD with previous MI, previous CVA, ischemic cardiomyopathy, HLD, PVD, tobacco use, and alcohol abuse admitted on 04/04/15 for left hemiparesis and left facial droop. CT revealed large R frontoparietal hemorrhage. Started on scheduled mannitol and admitted to ICU. Mannitol changed to 3% with cerebral edema. Repeat CT stable. CUS unremarkable. TTE showed EF 35-40%. LDL 86 and A1C 6.4. BP gradually controlled. Continued improvement with discharge from ICU to IP rehab. Due to hx of stroke and low EF, concerning for hemorrhagic infarct. 30 day cardiac event monitoring recommended on discharge.  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 Roda Shutters- the patient has been doing well. He stayed in CIR for about 2 weeks and then d/c home. BP much better controlled, BP 144/96. On coreg, hydralazine, losartan, and spironolactone. He is also on lipitor for HLD. He has not quit smoking yet but willing to use patch after discussion with cardiology this month. Repeat MRI and MRA on 07/31/15 showed resolution of hemorrahage, but also showed right M1 stenosis progressed from before, which consistent with multiple right MCA infarcts in the past.   He had ED visit on 08/13/15 due to left arm shaking, lasting 10 sec. Denies LOC, leg shaking or facial twitching. EMS called and he was sent to ED. Head CT no acute abnormalities. He was put on keppra 500mg  bid and discharged home.  Follow up 03/12/16 Dr. Roda Shutters- pt has been doing the same. No seizure or stroke like symptoms. On ASA and lipitor and keppra. BP today 139/84. Has not followed up with cardiology yet for the  low EF. Has not quit smoking yet, but cutting down.   Interval History5/29/18 Dr. Roda Shutters During the interval time, pt has been doing the same. He did not continue ASA at home as he did not buy OTC. He saw cardiology in 06/2016 and recommended to restart ASA. Repeat TTE showed improved EF. He has been off keppra and he does not know who took him off it. However, he did not have seizure except last Saturday he had some left arm tremor during a family re-union. It sounds more like clonus to me. His BP 115/75 today. He has not quit smoking yet but cut down. UPDATE 4/9/2019[name], 66 year old male returns for follow-up with a history of right MCA infarct in December 2016.  He remains on aspirin for secondary stroke prevention without recurrent stroke or TIA symptoms.  He has minimal bruising and no bleeding.  In addition he is on Lipitor without myalgias.  He gets little exercise.  He continues to smoke a pack about every 3 days and was encouraged to quit.  He continues to follow-up with Dr.McAlhany cardiology for his cardiac coronary artery disease and ischemic cardiomyopathy.  His hypertension is well controlled 132/88 in the office today.  He denies any seizure activity.  Appetite is good and he is sleeping well.  Neurologically he is stable.  He returns for reevaluation  REVIEW OF SYSTEMS: Full 14 system review of systems performed and notable only for those listed, all others are neg:  Constitutional: neg  Cardiovascular: neg Ear/Nose/Throat: neg  Skin: neg Eyes: neg Respiratory: neg Gastroitestinal:  neg  Hematology/Lymphatic: neg  Endocrine: neg Musculoskeletal:neg Allergy/Immunology: neg Neurological: neg Psychiatric: neg Sleep : neg   ALLERGIES: Allergies  Allergen Reactions  . Lisinopril Swelling and Other (See Comments)    angioedema    HOME MEDICATIONS: Outpatient Medications Prior to Visit  Medication Sig Dispense Refill  . amLODipine (NORVASC) 10 MG tablet Take 1 tablet  (10 mg total) by mouth daily. 30 tablet 0  . aspirin EC 325 MG tablet Take 1 tablet (325 mg total) by mouth daily. 30 tablet 0  . atorvastatin (LIPITOR) 80 MG tablet Take 1 tablet (80 mg total) by mouth daily at 6 PM. 30 tablet 0  . carvedilol (COREG) 25 MG tablet   0  . docusate sodium (COLACE) 100 MG capsule Take 1 capsule (100 mg total) by mouth every 12 (twelve) hours. 60 capsule 0  . fluticasone (FLONASE) 50 MCG/ACT nasal spray Place 2 sprays into both nostrils daily.  2  . folic acid (FOLVITE) 1 MG tablet Take 1 tablet (1 mg total) by mouth daily. 30 tablet 0  . hydrALAZINE (APRESOLINE) 50 MG tablet Take 1.5 tablets (75 mg total) by mouth 3 (three) times daily. 135 tablet 0  . losartan (COZAAR) 100 MG tablet   0  . metoprolol (LOPRESSOR) 100 MG tablet Take 1 tablet (100 mg total) by mouth 2 (two) times daily. 60 tablet 0  . Multiple Vitamin (MULTIVITAMIN WITH MINERALS) TABS tablet Take 1 tablet by mouth daily.    . nitroGLYCERIN (NITROSTAT) 0.4 MG SL tablet Place 1 tablet (0.4 mg total) under the tongue every 5 (five) minutes as needed for chest pain (up to 3 doses). 25 tablet 3  . potassium chloride 20 MEQ/15ML (10%) SOLN Take 7.5 mLs (10 mEq total) by mouth daily. 900 mL 0  . spironolactone (ALDACTONE) 25 MG tablet Take 1 tablet (25 mg total) by mouth daily. 30 tablet 0  . thiamine 100 MG tablet Take 1 tablet (100 mg total) by mouth daily. 30 tablet 0   No facility-administered medications prior to visit.     PAST MEDICAL HISTORY: Past Medical History:  Diagnosis Date  . CAD (coronary artery disease)    a. prior MI in 2006 b. 03/2012: 30% ostial LM stenosis, 30% pLAD stenosis, 60% mid-LADm 30% pCx, 3rd OM which was small in size with diffuse 90% stenosis involving both branches, and RCA with 50% mid-stenosis and 100% distal occlusion. POBA to PL branch and DES placement to dRCA.  . Claudication (HCC)   . Coronary artery disease    MI in 2008 with PCI to ? vessel; NSTEMI 03/2012;  NSTEMI 04/2012 Cath showed triple vessel dz including occluded RCA, s/p DES to distal RCA, PTCA posterolateral branch   . CVA (cerebral infarction)    03/2012 with left arm weakness/numbness and left facial droop.  [name] ETOH abuse    quit 04/06/12  . ETOH abuse   . History of stroke 03/2015   right frontal hemorrhage  . HTN (hypertension)   . Hyperlipidemia   . Hypertension   . Ischemic cardiomyopathy    04/2012 EF 35-40%  . Ischemic cardiomyopathy    a. EF 40-45% by echo in 03/2015  . Seizures (HCC)   . Stroke (HCC)   . Tobacco abuse    quit 04/06/12  . Tobacco abuse   . Withdrawal seizures (HCC)     PAST SURGICAL HISTORY: Past Surgical History:  Procedure Laterality Date  . CORONARY ANGIOPLASTY WITH STENT PLACEMENT  03/2012  . HERNIA  REPAIR    . LEFT HEART CATHETERIZATION WITH CORONARY ANGIOGRAM N/A 04/11/2012   Procedure: LEFT HEART CATHETERIZATION WITH CORONARY ANGIOGRAM;  Surgeon: Kathleene Hazel, MD;  Location: [facility] CATH LAB;  Service: Cardiovascular;  Laterality: N/A;  . PERCUTANEOUS CORONARY INTERVENTION-BALLOON ONLY  04/11/2012   Procedure: PERCUTANEOUS CORONARY INTERVENTION-BALLOON ONLY;  Surgeon: Kathleene Hazel, MD;  Location: [facility] CATH LAB;  Service: Cardiovascular;;  RPLA  . PERCUTANEOUS CORONARY STENT INTERVENTION (PCI-S)  04/11/2012   Procedure: PERCUTANEOUS CORONARY STENT INTERVENTION (PCI-S);  Surgeon: Kathleene Hazel, MD;  Location: [facility] CATH LAB;  Service: Cardiovascular;;  Distal RCA    FAMILY HISTORY: Family History  Problem Relation Age of Onset  . Heart attack Father   . Hypertension Father   . Cancer Mother   . Stroke Sister   . Hypertension Mother   . Hypertension Sister   . Hypertension Brother     SOCIAL HISTORY: Social History   Socioeconomic History  . Marital status: Single    Spouse name: Not on file  . Number of children: Not on file  . Years of education: Not on file  . Highest education level: Not on file    Occupational History  . Not on file  Social Needs  . Financial resource strain: Not on file  . Food insecurity:    Worry: Not on file    Inability: Not on file  . Transportation needs:    Medical: Not on file    Non-medical: Not on file  Tobacco Use  . Smoking status: Current Every Day Smoker    Packs/day: 0.50    Years: 40.00    Pack years: 20.00    Types: Cigarettes  . Smokeless tobacco: Never Used  Substance and Sexual Activity  . Alcohol use: Yes    Comment: "bourbon"  . Drug use: No    Types: Benzodiazepines, Marijuana  . Sexual activity: Never  Lifestyle  . Physical activity:    Days per week: Not on file    Minutes per session: Not on file  . Stress: Not on file  Relationships  . Social connections:    Talks on phone: Not on file    Gets together: Not on file    Attends religious service: Not on file    Active member of club or organization: Not on file    Attends meetings of clubs or organizations: Not on file    Relationship status: Not on file  . Intimate partner violence:    Fear of current or ex partner: Not on file    Emotionally abused: Not on file    Physically abused: Not on file    Forced sexual activity: Not on file  Other Topics Concern  . Not on file  Social History Narrative   Merged History Encounter          PHYSICAL EXAM  Vitals:   07/19/17 1355  BP: 132/88  Pulse: 76  Weight: 132 lb 6.4 oz (60.1 kg)  Height: 5\' 4"  (1.626 m)   Body mass index is 22.73 kg/m.  Generalized: Well developed, in no acute distress  Head: normocephalic and atraumatic,. Oropharynx benign  Neck: Supple, no carotid bruits  Cardiac: Regular rate rhythm, no murmur  Musculoskeletal: No deformity   Neurological examination   Mentation: Alert oriented to time, place, history taking. Attention span and concentration appropriate. Recent and remote memory intact.  Follows all commands speech and language fluent.   Cranial nerve II-XII: Pupils were equal  round  reactive to light extraocular movements were full, visual field were full on confrontational test. Facial sensation and strength were normal. hearing was intact to finger rubbing bilaterally. Uvula tongue midline. head turning and shoulder shrug were normal and symmetric.Tongue protrusion into cheek strength was normal. Motor: normal bulk and tone, full strength in the BUE, BLE, fine finger movements normal, no pronator drift. No focal weakness Sensory: normal and symmetric to light touch,  Coordination: finger-nose-finger, heel-to-shin bilaterally, no dysmetria Reflexes: 1+ upper lower and symmetric plantar responses were flexor bilaterally. Gait and Station: Rising up from seated position without assistance, normal stance,  moderate stride, good arm swing, smooth turning, able to perform tiptoe, and heel walking without difficulty. Tandem gait is steady  DIAGNOSTIC DATA (LABS, IMAGING, TESTING) - I reviewed patient records, labs, notes, testing and imaging myself where available.  Lab Results  Component Value Date   WBC 5.2 04/13/2016   HGB 13.6 10/26/2016   HCT 40.0 10/26/2016   MCV 84.5 04/13/2016   PLT 336 04/13/2016      Component Value Date/Time   NA 139 10/26/2016 2025   K 3.2 (L) 10/26/2016 2025   CL 102 10/26/2016 2025   CO2 22 04/13/2016 1451   GLUCOSE 129 (H) 10/26/2016 2025   BUN 9 10/26/2016 2025   CREATININE 0.80 10/26/2016 2025   CREATININE 0.90 08/01/2015 1333   CALCIUM 10.4 (H) 04/13/2016 1451   PROT 7.8 04/01/2016 0618   ALBUMIN 3.5 04/01/2016 0618   AST 22 04/01/2016 0618   ALT 21 04/01/2016 0618   ALKPHOS 46 04/01/2016 0618   BILITOT 0.9 04/01/2016 0618   GFRNONAA >60 04/13/2016 1451   GFRAA >60 04/13/2016 1451   Lab Results  Component Value Date   CHOL 179 04/07/2015   HDL 96 04/07/2015   LDLCALC 70 04/07/2015   TRIG 113 03/25/2016   CHOLHDL 1.9 04/07/2015   Lab Results  Component Value Date   HGBA1C 6.1 (H) 04/07/2015   Lab Results    Component Value Date   VITAMINB12 491 04/07/2015   Lab Results  Component Value Date   TSH 0.557 04/07/2015      ASSESSMENT AND PLAN   67 y.o. African American male with PMH of HTN, CAD with previous MI, previous CVA, ischemic cardiomyopathy, HLD, PVD, tobacco use, and alcohol abuse admitted on 04/04/15 for large R frontoparietal hemorrhage. Treated with 3% saline for cerebral edema. Repeat CT stable. CUS unremarkable. TTE showed EF 35-40%. LDL 86 and A1C 6.4. BP gradually controlled. Continued improvement with discharge from ICU to IP rehab. During the interval time, has not quit smoking yet. Repeat MRI and MRA on 07/31/15 showed resolution of hemorrahage, but also showed right M1 stenosis progressed from before, which consistent with multiple right MCA infarcts in the past. His ICH also considered as hemorrhagic infarcts. 30 day cardiac event monitoring showed no afib. He had on 08/13/15 left arm shaking, lasting 10 sec, concerning for partial seizure. He was put on keppra 500mg  bid. Since then, no more seizure like activities. However, his keppra was tapered off over the time, and he had no seizure . EEG in 03/2016 showed diffuse slowing. Still not quit smoking yet. Followed up with cardiology and repeat TTE showed EF 45-50%.  Plan:  Stressed the importance of management of risk factors to prevent further stroke Continue aspirin for secondary stroke prevention Maintain strict control of hypertension with blood pressure goal below 130/90, today's reading 132/88 continue antihypertensive medications Cholesterol with LDL cholesterol less than 70,  followed by primary care,  continue Lipitor Exercise by walking, 30 min daily  eat healthy diet with whole grains,  fresh fruits and vegetables Stop smoking Follow-up with primary care for stroke risk factor modification, maintain blood pressure goal less than 130 systolic, diabetes with A1c below 7, lipids with LDL below 70 Will discharge from  stroke clinic Continue follow-up with cardiology Dr. Clifton James I spent  in total face to face time with the patient more than 50% of which was spent counseling and coordination of care, reviewing test results reviewing medications and discussing and reviewing the diagnosis of stroke and management of risk factors.  Written information given to patient and reviewed with him as well Nilda Riggs, [facility], [facility], APRN  [facility] [address] [place], [place] [address] [phone]

## 2017-07-19 ENCOUNTER — Ambulatory Visit (INDEPENDENT_AMBULATORY_CARE_PROVIDER_SITE_OTHER): Payer: Medicare HMO | Admitting: Nurse Practitioner

## 2017-07-19 ENCOUNTER — Encounter: Payer: Self-pay | Admitting: Nurse Practitioner

## 2017-07-19 VITALS — BP 132/88 | HR 76 | Ht 64.0 in | Wt 132.4 lb

## 2017-07-19 DIAGNOSIS — Z8673 Personal history of transient ischemic attack (TIA), and cerebral infarction without residual deficits: Secondary | ICD-10-CM | POA: Diagnosis not present

## 2017-07-19 DIAGNOSIS — E785 Hyperlipidemia, unspecified: Secondary | ICD-10-CM

## 2017-07-19 DIAGNOSIS — I10 Essential (primary) hypertension: Secondary | ICD-10-CM | POA: Diagnosis not present

## 2017-07-19 NOTE — Progress Notes (Signed)
I reviewed above note and agree with the assessment and plan.  Marvel Plan, MD PhD Stroke Neurology 07/19/2017 6:15 PM

## 2017-07-19 NOTE — Patient Instructions (Addendum)
Stressed the importance of management of risk factors to prevent further stroke Continue aspirin for secondary stroke prevention Maintain strict control of hypertension with blood pressure goal below 130/90, today's reading 132/88 continue antihypertensive medications Cholesterol with LDL cholesterol less than 70, followed by primary care,  continue Lipitor Exercise by walking, 30 min daily  eat healthy diet with whole grains,  fresh fruits and vegetables Stop smoking Follow-up with primary care for stroke risk factor modification, maintain blood pressure goal less than 130 systolic, diabetes with A1c below 7, lipids with LDL below 70 Will discharge from stroke clinic Continue follow-up with cardiology Dr. Clifton James   Stroke Prevention Some health problems and behaviors may make it more likely for you to have a stroke. Below are ways to lessen your risk of having a stroke.  Be active for at least 30 minutes on most or all days.  Do not smoke. Try not to be around others who smoke.  Do not drink too much alcohol. ? Do not have more than 2 drinks a day if you are a man. ? Do not have more than 1 drink a day if you are a woman and are not pregnant.  Eat healthy foods, such as fruits and vegetables. If you were put on a specific diet, follow the diet as told.  Keep your cholesterol levels under control through diet and medicines. Look for foods that are low in saturated fat, trans fat, cholesterol, and are high in fiber.  If you have diabetes, follow all diet plans and take your medicine as told.  Ask your doctor if you need treatment to lower your blood pressure. If you have high blood pressure (hypertension), follow all diet plans and take your medicine as told by your doctor.  If you are 68-15 years old, have your blood pressure checked every 3-5 years. If you are age 81 or older, have your blood pressure checked every year.  Keep a healthy weight. Eat foods that are low in calories,  salt, saturated fat, trans fat, and cholesterol.  Do not take drugs.  Avoid birth control pills, if this applies. Talk to your doctor about the risks of taking birth control pills.  Talk to your doctor if you have sleep problems (sleep apnea).  Take all medicine as told by your doctor. ? You may be told to take aspirin or blood thinner medicine. Take this medicine as told by your doctor. ? Understand your medicine instructions.  Make sure any other conditions you have are being taken care of.  Get help right away if:  You suddenly lose feeling (you feel numb) or have weakness in your face, arm, or leg.  Your face or eyelid hangs down to one side.  You suddenly feel confused.  You have trouble talking (aphasia) or understanding what people are saying.  You suddenly have trouble seeing in one or both eyes.  You suddenly have trouble walking.  You are dizzy.  You lose your balance or your movements are clumsy (uncoordinated).  You suddenly have a very bad headache and you do not know the cause.  You have new chest pain.  Your heart feels like it is fluttering or skipping a beat (irregular heartbeat). Do not wait to see if the symptoms above go away. Get help right away. Call your local emergency services (911 in [place] Do not drive yourself to the hospital. This information is not intended to replace advice given to you by your health care provider. Make sure  you discuss any questions you have with your health care provider. Document Released: 09/28/2011 Document Revised: 09/04/2015 Document Reviewed: 09/29/2012 Elsevier Interactive Patient Education  [employer].

## 2017-10-30 ENCOUNTER — Emergency Department (HOSPITAL_COMMUNITY): Payer: Medicare HMO

## 2017-10-30 ENCOUNTER — Emergency Department (HOSPITAL_COMMUNITY)
Admission: EM | Admit: 2017-10-30 | Discharge: 2017-10-31 | Disposition: A | Discharge status: Home / Self Care | Payer: Medicare HMO | Attending: Emergency Medicine | Admitting: Emergency Medicine

## 2017-10-30 ENCOUNTER — Encounter (HOSPITAL_COMMUNITY): Payer: Self-pay | Admitting: *Deleted

## 2017-10-30 ENCOUNTER — Other Ambulatory Visit: Payer: Self-pay

## 2017-10-30 DIAGNOSIS — Z79899 Other long term (current) drug therapy: Secondary | ICD-10-CM | POA: Diagnosis not present

## 2017-10-30 DIAGNOSIS — I10 Essential (primary) hypertension: Secondary | ICD-10-CM | POA: Insufficient documentation

## 2017-10-30 DIAGNOSIS — K5900 Constipation, unspecified: Secondary | ICD-10-CM | POA: Insufficient documentation

## 2017-10-30 DIAGNOSIS — J449 Chronic obstructive pulmonary disease, unspecified: Secondary | ICD-10-CM | POA: Diagnosis not present

## 2017-10-30 DIAGNOSIS — Z955 Presence of coronary angioplasty implant and graft: Secondary | ICD-10-CM | POA: Diagnosis not present

## 2017-10-30 DIAGNOSIS — Z7982 Long term (current) use of aspirin: Secondary | ICD-10-CM | POA: Insufficient documentation

## 2017-10-30 DIAGNOSIS — I2510 Atherosclerotic heart disease of native coronary artery without angina pectoris: Secondary | ICD-10-CM | POA: Insufficient documentation

## 2017-10-30 DIAGNOSIS — F17210 Nicotine dependence, cigarettes, uncomplicated: Secondary | ICD-10-CM | POA: Diagnosis not present

## 2017-10-30 LAB — CBC WITH DIFFERENTIAL/PLATELET
BASOS PCT: 0 %
Basophils Absolute: 0 10*3/uL (ref 0.0–0.1)
EOS ABS: 0.2 10*3/uL (ref 0.0–0.7)
Eosinophils Relative: 4 %
HCT: 43.6 % (ref 39.0–52.0)
HEMOGLOBIN: 14.3 g/dL (ref 13.0–17.0)
LYMPHS ABS: 1.7 10*3/uL (ref 0.7–4.0)
Lymphocytes Relative: 39 %
MCH: 28.5 pg (ref 26.0–34.0)
MCHC: 32.8 g/dL (ref 30.0–36.0)
MCV: 87 fL (ref 78.0–100.0)
MONO ABS: 0.4 10*3/uL (ref 0.1–1.0)
MONOS PCT: 8 %
Neutro Abs: 2.2 10*3/uL (ref 1.7–7.7)
Neutrophils Relative %: 49 %
Platelets: 216 10*3/uL (ref 150–400)
RBC: 5.01 MIL/uL (ref 4.22–5.81)
RDW: 14.8 % (ref 11.5–15.5)
WBC: 4.3 10*3/uL (ref 4.0–10.5)

## 2017-10-30 LAB — COMPREHENSIVE METABOLIC PANEL
ALBUMIN: 4.3 g/dL (ref 3.5–5.0)
ALK PHOS: 53 U/L (ref 38–126)
ALT: 30 U/L (ref 0–44)
AST: 29 U/L (ref 15–41)
Anion gap: 8 (ref 5–15)
BUN: 18 mg/dL (ref 8–23)
CHLORIDE: 104 mmol/L (ref 98–111)
CO2: 26 mmol/L (ref 22–32)
Calcium: 9.4 mg/dL (ref 8.9–10.3)
Creatinine, Ser: 0.95 mg/dL (ref 0.61–1.24)
GFR calc Af Amer: >60 mL/min (ref >60)
Glucose, Bld: 108 mg/dL — ABNORMAL HIGH (ref 70–99)
Potassium: 4.1 mmol/L (ref 3.5–5.1)
SODIUM: 138 mmol/L (ref 135–145)
Total Bilirubin: 0.5 mg/dL (ref 0.3–1.2)
Total Protein: 7.8 g/dL (ref 6.5–8.1)

## 2017-10-30 LAB — LIPASE, BLOOD: LIPASE: 50 U/L (ref 11–51)

## 2017-10-30 MED ORDER — FLEET ENEMA 7-19 GM/118ML RE ENEM
1.0000 | ENEMA | Freq: Once | RECTAL | Status: AC
  Administered 2017-10-31: 1 via RECTAL
  Filled 2017-10-30: qty 1

## 2017-10-30 NOTE — ED Triage Notes (Signed)
Pt arrives from home via [facility]. Per their report, lower midline abdominal pain, no BM x 3 days, also unable to urinate. Took MOM today without any results. 160/98, hr 91, 98% RA, R 16. C/A/O x 4.

## 2017-10-30 NOTE — ED Provider Notes (Signed)
[facility]-EMERGENCY DEPT Provider Note   CSN: [id number] Arrival date & time: 10/30/17  2233     History   Chief Complaint Chief Complaint  Patient presents with  . Constipation    HPI Randall Patel is a 67 y.o. male.   67 year old male with a history of CAD, hypertension, CVA, cardiomyopathy, alcohol abuse presents to the emergency department for evaluation of abdominal pain.  He reports mid abdominal pain has been ongoing for the past 3 days.  Symptoms are worsened when he tries to have a bowel movement.  He states that he feels as though his stool is in his rectum, but he is unable to push it out.  He has tried milk of magnesia for his symptoms without relief.  Denies any urinary symptoms, vomiting, fever.  Abdominal surgical history significant for hernia repair.     Past Medical History:  Diagnosis Date  . CAD (coronary artery disease)    a. prior MI in 2006 b. 03/2012: 30% ostial LM stenosis, 30% pLAD stenosis, 60% mid-LADm 30% pCx, 3rd OM which was small in size with diffuse 90% stenosis involving both branches, and RCA with 50% mid-stenosis and 100% distal occlusion. POBA to PL branch and DES placement to dRCA.  . Claudication (HCC)   . Coronary artery disease    MI in 2008 with PCI to ? vessel; NSTEMI 03/2012; NSTEMI 04/2012 Cath showed triple vessel dz including occluded RCA, s/p DES to distal RCA, PTCA posterolateral branch   . CVA (cerebral infarction)    03/2012 with left arm weakness/numbness and left facial droop.  [name] ETOH abuse    quit 04/06/12  . ETOH abuse   . History of stroke 03/2015   right frontal hemorrhage  . HTN (hypertension)   . Hyperlipidemia   . Hypertension   . Ischemic cardiomyopathy    04/2012 EF 35-40%  . Ischemic cardiomyopathy    a. EF 40-45% by echo in 03/2015  . Seizures (HCC)   . Stroke (HCC)   . Tobacco abuse    quit 04/06/12  . Tobacco abuse   . Withdrawal seizures [facility])     Patient Active Problem List     Diagnosis Date Noted  . Anoxic brain injury (HCC) 03/31/2016  . Pulseless electrical activity (HCC)   . Chronic obstructive pulmonary disease (HCC)   . Dysphagia, post-stroke   . Abnormal CT of brain   . Coronary artery disease involving native coronary artery of native heart without angina pectoris   . ETOH abuse   . Tachypnea   . Benign essential HTN   . Hyperglycemia   . Pain   . Leukocytosis   . Acute pulmonary edema (HCC)   . Respiratory failure (HCC)   . Sinus tachycardia   . Cardiac arrest (HCC) 03/23/2016  . Palpitations 08/15/2015  . History of stroke 08/15/2015  . Anterior cerebral circulation hemorrhagic infarction (HCC) 08/15/2015  . Hyperlipidemia 08/15/2015  . Muscle stiffness   . Cough   . Hemiparesis affecting left side as late effect of stroke (HCC)   . Dysphagia as late effect of cerebrovascular disease   . Essential hypertension   . Acute blood loss anemia   . 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 Dysphagia   . Dysarthria due to cerebrovascular accident   . Diastolic dysfunction   . Tobacco abuse   . ETOH abuse   . History of CVA with residual deficit   . Coronary  artery disease involving native coronary artery of native heart with angina pectoris (HCC)   . Tachypnea   . Hypernatremia   . Hypokalemia   . Thrombocytopenia (HCC)   . Intracranial hemorrhage (HCC)   . Essential hypertension, malignant   . ICH (intracerebral hemorrhage) (HCC) 04/04/2015  . STEMI (ST elevation myocardial infarction) (HCC) 04/14/2012  . NSTEMI (non-ST elevated myocardial infarction) (HCC) 04/14/2012  . Cardiomyopathy, ischemic 04/14/2012  . Cerebral embolism with cerebral infarction (HCC) 04/03/2012  . Unspecified transient cerebral ischemia 04/02/2012  . Hemiplegia, unspecified, affecting nondominant side 04/02/2012  . CAD (coronary artery disease) s/p stent 2008 in [place] 04/02/2012  . Hypertension     Past Surgical History:   Procedure Laterality Date  . CORONARY ANGIOPLASTY WITH STENT PLACEMENT  03/2012  . HERNIA REPAIR    . LEFT HEART CATHETERIZATION WITH CORONARY ANGIOGRAM N/A 04/11/2012   Procedure: LEFT HEART CATHETERIZATION WITH CORONARY ANGIOGRAM;  Surgeon: Kathleene Hazel, MD;  Location: [facility] CATH LAB;  Service: Cardiovascular;  Laterality: N/A;  . PERCUTANEOUS CORONARY INTERVENTION-BALLOON ONLY  04/11/2012   Procedure: PERCUTANEOUS CORONARY INTERVENTION-BALLOON ONLY;  Surgeon: Kathleene Hazel, MD;  Location: [facility] CATH LAB;  Service: Cardiovascular;;  RPLA  . PERCUTANEOUS CORONARY STENT INTERVENTION (PCI-S)  04/11/2012   Procedure: PERCUTANEOUS CORONARY STENT INTERVENTION (PCI-S);  Surgeon: Kathleene Hazel, MD;  Location: [facility] CATH LAB;  Service: Cardiovascular;;  Distal RCA        Home Medications    Prior to Admission medications   Medication Sig Start Date End Date Taking? Authorizing Provider  amLODipine (NORVASC) 10 MG tablet Take 1 tablet (10 mg total) by mouth daily. 04/14/16   Love, Evlyn Kanner, PA-C  aspirin EC 325 MG tablet Take 1 tablet (325 mg total) by mouth daily. 09/07/16   Marvel Plan, MD  atorvastatin (LIPITOR) 80 MG tablet Take 1 tablet (80 mg total) by mouth daily at 6 PM. 04/14/16   Love, Evlyn Kanner, PA-C  carvedilol (COREG) 25 MG tablet  07/03/17   [provider]  docusate sodium (COLACE) 100 MG capsule Take 1 capsule (100 mg total) by mouth every 12 (twelve) hours. 04/26/16   Arby Barrette, MD  fluticasone (FLONASE) 50 MCG/ACT nasal spray Place 2 sprays into both nostrils daily. 04/01/16   Kathlen Mody, MD  folic acid (FOLVITE) 1 MG tablet Take 1 tablet (1 mg total) by mouth daily. 04/14/16   Love, Evlyn Kanner, PA-C  hydrALAZINE (APRESOLINE) 50 MG tablet Take 1.5 tablets (75 mg total) by mouth 3 (three) times daily. 04/14/16   Love, Evlyn Kanner, PA-C  losartan (COZAAR) 100 MG tablet  07/03/17   [provider]  metoprolol (LOPRESSOR) 100 MG tablet Take 1 tablet (100  mg total) by mouth 2 (two) times daily. 04/14/16   Love, Evlyn Kanner, PA-C  Multiple Vitamin (MULTIVITAMIN WITH MINERALS) TABS tablet Take 1 tablet by mouth daily. 04/01/16   Kathlen Mody, MD  nitroGLYCERIN (NITROSTAT) 0.4 MG SL tablet Place 1 tablet (0.4 mg total) under the tongue every 5 (five) minutes as needed for chest pain (up to 3 doses). 04/14/12   Hope, Jessica A, PA-C  polyethylene glycol powder (GLYCOLAX/MIRALAX) powder Take 17 g by mouth daily. Until daily soft stools  OTC 10/31/17   Antony Madura, PA-C  potassium chloride 20 MEQ/15ML (10%) SOLN Take 7.5 mLs (10 mEq total) by mouth daily. 04/15/16   Love, Evlyn Kanner, PA-C  spironolactone (ALDACTONE) 25 MG tablet Take 1 tablet (25 mg total) by mouth daily. 04/14/16  Love, Evlyn Kanner, PA-C  thiamine 100 MG tablet Take 1 tablet (100 mg total) by mouth daily. 04/14/16   Jacquelynn Cree, PA-C    Family History Family History  Problem Relation Age of Onset  . Heart attack Father   . Hypertension Father   . Cancer Mother   . Stroke Sister   . Hypertension Mother   . Hypertension Sister   . Hypertension Brother     Social History Social History   Tobacco Use  . Smoking status: Current Every Day Smoker    Packs/day: 0.50    Years: 40.00    Pack years: 20.00    Types: Cigarettes  . Smokeless tobacco: Never Used  Substance Use Topics  . Alcohol use: Yes    Comment: "bourbon"  . Drug use: No    Types: Benzodiazepines, Marijuana     Allergies   Lisinopril   Review of Systems Review of Systems Ten systems reviewed and are negative for acute change, except as noted in the HPI.    Physical Exam Updated Vital Signs BP 137/82   Pulse 61   Temp 97.8 F (36.6 C) (Oral)   Resp 19   Ht 5\' 3"  (1.6 m)   Wt 58.5 kg (129 lb)   SpO2 98%   BMI 22.85 kg/m   Physical Exam  Constitutional: He is oriented to person, place, and time. He appears well-developed and well-nourished. No distress.  Nontoxic appearing and in NAD  HENT:  Head:  Normocephalic and atraumatic.  Eyes: Conjunctivae and EOM are normal. No scleral icterus.  Neck: Normal range of motion.  Cardiovascular: Normal rate, regular rhythm and intact distal pulses.  Pulmonary/Chest: Effort normal. No stridor. No respiratory distress.  Respirations even and unlabored  Abdominal: Soft. He exhibits no distension and no mass. There is no tenderness. There is no guarding.  Soft, nontender, nondistended abdomen.  Genitourinary:  Genitourinary Comments: Exam chaperoned by Kerrie Pleasure, RN. Normal rectal tone. No hard stool in rectal vault. No melena or hematochezia noted; brown stool appreciated on DRE.  Musculoskeletal: Normal range of motion.  Neurological: He is alert and oriented to person, place, and time. He exhibits normal muscle tone. Coordination normal.  Ambulatory with steady gait moving all extremities.  Skin: Skin is warm and dry. No rash noted. He is not diaphoretic. No erythema. No pallor.  Psychiatric: He has a normal mood and affect. His behavior is normal.  Nursing note and vitals reviewed.    ED Treatments / Results  Labs (all labs ordered are listed, but only abnormal results are displayed) Labs Reviewed  COMPREHENSIVE METABOLIC PANEL - Abnormal; Notable for the following components:      Result Value   Glucose, Bld 108 (*)    All other components within normal limits  CBC WITH DIFFERENTIAL/PLATELET  LIPASE, BLOOD    EKG None  Radiology Dg Abd 2 Views  Result Date: 10/30/2017 CLINICAL DATA:  Constipation for several days. EXAM: ABDOMEN - 2 VIEW COMPARISON:  04/25/2016 FINDINGS: No evidence of dilated bowel loops. Stool is seen within the rectum, but there is no significant stool burden. No evidence of free intraperitoneal air. No radiopaque calculi identified. IMPRESSION: Unremarkable bowel gas pattern.  No acute findings. Electronically Signed   By: Myles Rosenthal M.D.   On: 10/30/2017 23:21    Procedures Procedures (including critical  care time)  Medications Ordered in ED Medications  sodium phosphate (FLEET) 7-19 GM/118ML enema 1 enema (1 enema Rectal Given 10/31/17 0200)  3:15 AM Patient had a large bowel movement following enema.  He reports resolution of his abdominal pain.  Work-up has been reassuring.  Plan for discharge on MiraLAX.   Initial Impression / Assessment and Plan / ED Course  I have reviewed the triage vital signs and the nursing notes.  Pertinent labs & imaging results that were available during my care of the patient were reviewed by me and considered in my medical decision making (see chart for details).     Patient presenting for constipation x3 days with abdominal discomfort.  No indication for manual disimpaction.  He was given an enema with subsequent large bowel movement and resolution of his abdominal discomfort.  Laboratory work-up reassuring.  Patient afebrile with stable vital signs.  Will continue with management on outpatient MiraLAX.  Encouraged primary care follow-up for recheck.  Return precautions discussed and provided. Patient discharged in stable condition with no unaddressed concerns.   Final Clinical Impressions(s) / ED Diagnoses   Final diagnoses:  Constipation  Constipation, unspecified constipation type    ED Discharge Orders        Ordered    polyethylene glycol powder (GLYCOLAX/MIRALAX) powder  Daily     10/31/17 0314       Antony Madura, PA-C 10/31/17 0320    Wynetta Fines, MD 10/31/17 [phone]

## 2017-10-30 NOTE — ED Notes (Signed)
Patient transported to X-ray 

## 2017-10-30 NOTE — ED Notes (Signed)
Bed: WA18 Expected date:  Expected time:  Means of arrival:  Comments: 67yo M/ Abd pain 

## 2017-10-30 NOTE — ED Triage Notes (Signed)
Pt states that he is having difficulty having BM. When he strains to have a BM it causes his stomach to hurt. Pt says it has been since about Thursday or Friday since he had a BM. He took MOM about 1600 today. Pt denies any urinary symptoms or nausea.

## 2017-10-31 DIAGNOSIS — K5900 Constipation, unspecified: Secondary | ICD-10-CM | POA: Diagnosis not present

## 2017-10-31 MED ORDER — POLYETHYLENE GLYCOL 3350 17 GM/SCOOP PO POWD: 17.0000 g | Freq: Every day | ORAL | 0 refills | Status: DC

## 2017-10-31 NOTE — ED Notes (Signed)
ED Provider at bedside. 

## 2018-03-07 ENCOUNTER — Other Ambulatory Visit: Payer: Self-pay

## 2018-03-07 ENCOUNTER — Emergency Department (HOSPITAL_COMMUNITY)
Admission: EM | Admit: 2018-03-07 | Discharge: 2018-03-07 | Disposition: A | Discharge status: Home / Self Care | Payer: Medicare HMO | Attending: Emergency Medicine | Admitting: Emergency Medicine

## 2018-03-07 ENCOUNTER — Encounter (HOSPITAL_COMMUNITY): Payer: Self-pay | Admitting: Emergency Medicine

## 2018-03-07 DIAGNOSIS — K5909 Other constipation: Secondary | ICD-10-CM | POA: Insufficient documentation

## 2018-03-07 DIAGNOSIS — I10 Essential (primary) hypertension: Secondary | ICD-10-CM | POA: Insufficient documentation

## 2018-03-07 DIAGNOSIS — F1010 Alcohol abuse, uncomplicated: Secondary | ICD-10-CM | POA: Diagnosis not present

## 2018-03-07 DIAGNOSIS — I252 Old myocardial infarction: Secondary | ICD-10-CM | POA: Diagnosis not present

## 2018-03-07 DIAGNOSIS — I2510 Atherosclerotic heart disease of native coronary artery without angina pectoris: Secondary | ICD-10-CM | POA: Insufficient documentation

## 2018-03-07 DIAGNOSIS — Z955 Presence of coronary angioplasty implant and graft: Secondary | ICD-10-CM | POA: Diagnosis not present

## 2018-03-07 DIAGNOSIS — Z8673 Personal history of transient ischemic attack (TIA), and cerebral infarction without residual deficits: Secondary | ICD-10-CM | POA: Diagnosis not present

## 2018-03-07 DIAGNOSIS — Z79899 Other long term (current) drug therapy: Secondary | ICD-10-CM | POA: Diagnosis not present

## 2018-03-07 DIAGNOSIS — K5900 Constipation, unspecified: Secondary | ICD-10-CM | POA: Diagnosis present

## 2018-03-07 DIAGNOSIS — F17210 Nicotine dependence, cigarettes, uncomplicated: Secondary | ICD-10-CM | POA: Insufficient documentation

## 2018-03-07 DIAGNOSIS — Z7982 Long term (current) use of aspirin: Secondary | ICD-10-CM | POA: Diagnosis not present

## 2018-03-07 MED ORDER — BISACODYL 10 MG RE SUPP
10.0000 mg | Freq: Once | RECTAL | Status: AC
  Administered 2018-03-07: 10 mg via RECTAL
  Filled 2018-03-07: qty 1

## 2018-03-07 NOTE — Discharge Instructions (Signed)
It was our pleasure to provide your ER care today - we hope that you feel better.  Drink plenty of fluids. Get adequate fiber in diet.  Take colace (stool softener) and miralax (laxative) as need - both of these medications are available over the counter.  Follow up with primary care doctor in the coming week.  Return to ER if worse, new symptoms, fevers, new or severe pain, persistent vomiting, other concern.

## 2018-03-07 NOTE — ED Notes (Signed)
Per Dr. Denton Lank pt. Does not need to be in gown.

## 2018-03-07 NOTE — ED Triage Notes (Signed)
Pt arrives by [facility] from home. Pt complains of constipation for 3 days despite taking laxatives and fiber.  BP 122/78 HR 93 RR 18

## 2018-03-07 NOTE — ED Provider Notes (Signed)
[facility] EMERGENCY DEPARTMENT Provider Note   CSN: [id number] Arrival date & time: 03/07/18  1906     History   Chief Complaint Chief Complaint  Patient presents with  . Constipation    HPI Randall Patel is a 67 y.o. male.  Patient c/o constipation for the past 2 days. States history of same. Tried fiber in diet, but no other meds. Pt denies abd pain or distension. No nausea or vomiting. Normal appetite. States other than constipation, feels normal, at baseline.   The history is provided by the patient.  Constipation   Pertinent negatives include no abdominal pain.    Past Medical History:  Diagnosis Date  . CAD (coronary artery disease)    a. prior MI in 2006 b. 03/2012: 30% ostial LM stenosis, 30% pLAD stenosis, 60% mid-LADm 30% pCx, 3rd OM which was small in size with diffuse 90% stenosis involving both branches, and RCA with 50% mid-stenosis and 100% distal occlusion. POBA to PL branch and DES placement to dRCA.  . Claudication (HCC)   . Coronary artery disease    MI in 2008 with PCI to ? vessel; NSTEMI 03/2012; NSTEMI 04/2012 Cath showed triple vessel dz including occluded RCA, s/p DES to distal RCA, PTCA posterolateral branch   . CVA (cerebral infarction)    03/2012 with left arm weakness/numbness and left facial droop.  [name] ETOH abuse    quit 04/06/12  . ETOH abuse   . History of stroke 03/2015   right frontal hemorrhage  . HTN (hypertension)   . Hyperlipidemia   . Hypertension   . Ischemic cardiomyopathy    04/2012 EF 35-40%  . Ischemic cardiomyopathy    a. EF 40-45% by echo in 03/2015  . Seizures (HCC)   . Stroke (HCC)   . Tobacco abuse    quit 04/06/12  . Tobacco abuse   . Withdrawal seizures [facility])     Patient Active Problem List   Diagnosis Date Noted  . Anoxic brain injury (HCC) 03/31/2016  . Pulseless electrical activity (HCC)   . Chronic obstructive pulmonary disease (HCC)   . Dysphagia, post-stroke   . Abnormal CT of brain    . Coronary artery disease involving native coronary artery of native heart without angina pectoris   . ETOH abuse   . Tachypnea   . Benign essential HTN   . Hyperglycemia   . Pain   . Leukocytosis   . Acute pulmonary edema (HCC)   . Respiratory failure (HCC)   . Sinus tachycardia   . Cardiac arrest (HCC) 03/23/2016  . Palpitations 08/15/2015  . History of stroke 08/15/2015  . Anterior cerebral circulation hemorrhagic infarction (HCC) 08/15/2015  . Hyperlipidemia 08/15/2015  . Muscle stiffness   . Cough   . Hemiparesis affecting left side as late effect of stroke (HCC)   . Dysphagia as late effect of cerebrovascular disease   . Essential hypertension   . Acute blood loss anemia   . 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 Dysphagia   . Dysarthria due to cerebrovascular accident   . Diastolic dysfunction   . Tobacco abuse   . ETOH abuse   . History of CVA with residual deficit   . Coronary artery disease involving native coronary artery of native heart with angina pectoris (HCC)   . Tachypnea   . Hypernatremia   . Hypokalemia   . Thrombocytopenia (HCC)   . Intracranial hemorrhage (HCC)   . Essential  hypertension, malignant   . ICH (intracerebral hemorrhage) (HCC) 04/04/2015  . STEMI (ST elevation myocardial infarction) (HCC) 04/14/2012  . NSTEMI (non-ST elevated myocardial infarction) (HCC) 04/14/2012  . Cardiomyopathy, ischemic 04/14/2012  . Cerebral embolism with cerebral infarction (HCC) 04/03/2012  . Unspecified transient cerebral ischemia 04/02/2012  . Hemiplegia, unspecified, affecting nondominant side 04/02/2012  . CAD (coronary artery disease) s/p stent 2008 in [place] 04/02/2012  . Hypertension     Past Surgical History:  Procedure Laterality Date  . CORONARY ANGIOPLASTY WITH STENT PLACEMENT  03/2012  . HERNIA REPAIR    . LEFT HEART CATHETERIZATION WITH CORONARY ANGIOGRAM N/A 04/11/2012   Procedure: LEFT HEART CATHETERIZATION  WITH CORONARY ANGIOGRAM;  Surgeon: Kathleene Hazel, MD;  Location: [facility] CATH LAB;  Service: Cardiovascular;  Laterality: N/A;  . PERCUTANEOUS CORONARY INTERVENTION-BALLOON ONLY  04/11/2012   Procedure: PERCUTANEOUS CORONARY INTERVENTION-BALLOON ONLY;  Surgeon: Kathleene Hazel, MD;  Location: [facility] CATH LAB;  Service: Cardiovascular;;  RPLA  . PERCUTANEOUS CORONARY STENT INTERVENTION (PCI-S)  04/11/2012   Procedure: PERCUTANEOUS CORONARY STENT INTERVENTION (PCI-S);  Surgeon: Kathleene Hazel, MD;  Location: [facility] CATH LAB;  Service: Cardiovascular;;  Distal RCA        Home Medications    Prior to Admission medications   Medication Sig Start Date End Date Taking? Authorizing Provider  amLODipine (NORVASC) 10 MG tablet Take 1 tablet (10 mg total) by mouth daily. 04/14/16   Love, Evlyn Kanner, PA-C  aspirin EC 325 MG tablet Take 1 tablet (325 mg total) by mouth daily. 09/07/16   Marvel Plan, MD  atorvastatin (LIPITOR) 80 MG tablet Take 1 tablet (80 mg total) by mouth daily at 6 PM. 04/14/16   Love, Evlyn Kanner, PA-C  carvedilol (COREG) 25 MG tablet  07/03/17   [provider]  docusate sodium (COLACE) 100 MG capsule Take 1 capsule (100 mg total) by mouth every 12 (twelve) hours. 04/26/16   Arby Barrette, MD  fluticasone (FLONASE) 50 MCG/ACT nasal spray Place 2 sprays into both nostrils daily. 04/01/16   Kathlen Mody, MD  folic acid (FOLVITE) 1 MG tablet Take 1 tablet (1 mg total) by mouth daily. 04/14/16   Love, Evlyn Kanner, PA-C  hydrALAZINE (APRESOLINE) 50 MG tablet Take 1.5 tablets (75 mg total) by mouth 3 (three) times daily. 04/14/16   Love, Evlyn Kanner, PA-C  losartan (COZAAR) 100 MG tablet  07/03/17   [provider]  metoprolol (LOPRESSOR) 100 MG tablet Take 1 tablet (100 mg total) by mouth 2 (two) times daily. 04/14/16   Love, Evlyn Kanner, PA-C  Multiple Vitamin (MULTIVITAMIN WITH MINERALS) TABS tablet Take 1 tablet by mouth daily. 04/01/16   Kathlen Mody, MD  nitroGLYCERIN  (NITROSTAT) 0.4 MG SL tablet Place 1 tablet (0.4 mg total) under the tongue every 5 (five) minutes as needed for chest pain (up to 3 doses). 04/14/12   Hope, Jessica A, PA-C  polyethylene glycol powder (GLYCOLAX/MIRALAX) powder Take 17 g by mouth daily. Until daily soft stools  OTC 10/31/17   Antony Madura, PA-C  potassium chloride 20 MEQ/15ML (10%) SOLN Take 7.5 mLs (10 mEq total) by mouth daily. 04/15/16   Love, Evlyn Kanner, PA-C  spironolactone (ALDACTONE) 25 MG tablet Take 1 tablet (25 mg total) by mouth daily. 04/14/16   Love, Evlyn Kanner, PA-C  thiamine 100 MG tablet Take 1 tablet (100 mg total) by mouth daily. 04/14/16   Jacquelynn Cree, PA-C    Family History Family History  Problem Relation Age of Onset  .  Heart attack Father   . Hypertension Father   . Cancer Mother   . Stroke Sister   . Hypertension Mother   . Hypertension Sister   . Hypertension Brother     Social History Social History   Tobacco Use  . Smoking status: Current Every Day Smoker    Packs/day: 0.50    Years: 40.00    Pack years: 20.00    Types: Cigarettes  . Smokeless tobacco: Never Used  Substance Use Topics  . Alcohol use: Yes    Comment: "bourbon"  . Drug use: No    Types: Benzodiazepines, Marijuana     Allergies   Lisinopril   Review of Systems Review of Systems  Constitutional: Negative for fever.  Eyes: Negative for redness.  Respiratory: Negative for cough and shortness of breath.   Cardiovascular: Negative for chest pain.  Gastrointestinal: Positive for constipation. Negative for abdominal pain and vomiting.  Genitourinary: Negative for flank pain.  Musculoskeletal: Negative for back pain and neck pain.  Skin: Negative for rash.  Neurological: Negative for headaches.  Hematological: Does not bruise/bleed easily.  Psychiatric/Behavioral: Negative for dysphoric mood.     Physical Exam Updated Vital Signs BP 98/64   Pulse (!) 56   Temp 98.1 F (36.7 C) (Oral)   Resp 15   Ht 1.6 m (5\' 3" )    Wt 57.2 kg   SpO2 98%   BMI 22.32 kg/m   Physical Exam  Constitutional: He appears well-developed and well-nourished.  HENT:  Head: Atraumatic.  Mouth/Throat: Oropharynx is clear and moist.  Eyes: Conjunctivae are normal.  Neck: Neck supple. No tracheal deviation present.  Cardiovascular: Normal rate, regular rhythm, normal heart sounds and intact distal pulses.  Pulmonary/Chest: Effort normal and breath sounds normal. No accessory muscle usage. No respiratory distress.  Abdominal: Soft. Bowel sounds are normal. He exhibits no distension and no mass. There is no tenderness. There is no rebound and no guarding. No hernia.  Genitourinary:  Genitourinary Comments: Soft, moderate amount light brown stool on rectal. No impaction. No mass.   Musculoskeletal: He exhibits no edema.  Neurological: He is alert.  Motor/sens grossly intact. Steady gait.   Skin: Skin is warm and dry.  Psychiatric: He has a normal mood and affect.  Nursing note and vitals reviewed.    ED Treatments / Results  Labs (all labs ordered are listed, but only abnormal results are displayed) Labs Reviewed - No data to display  EKG None  Radiology No results found.  Procedures Procedures (including critical care time)  Medications Ordered in ED Medications  bisacodyl (DULCOLAX) suppository 10 mg (10 mg Rectal Given 03/07/18 2105)     Initial Impression / Assessment and Plan / ED Course  I have reviewed the triage vital signs and the nursing notes.  Pertinent labs & imaging results that were available during my care of the patient were reviewed by me and considered in my medical decision making (see chart for details).  Reviewed nursing notes and prior charts for additional history.   Dulcolax suppository.   rec colace and miralax at home prn.  Pt abd exam benign, no nv. Pt appears stable for d/c.     Final Clinical Impressions(s) / ED Diagnoses   Final diagnoses:  None    ED Discharge  Orders    None       Cathren Laine, MD 03/07/18 2121

## 2018-04-02 ENCOUNTER — Observation Stay (HOSPITAL_COMMUNITY)
Admission: EM | Admit: 2018-04-02 | Discharge: 2018-04-03 | Disposition: A | Discharge status: Home / Self Care | Payer: Medicare HMO | Attending: Internal Medicine | Admitting: Internal Medicine

## 2018-04-02 ENCOUNTER — Emergency Department (HOSPITAL_COMMUNITY): Payer: Medicare HMO

## 2018-04-02 ENCOUNTER — Encounter (HOSPITAL_COMMUNITY): Payer: Self-pay | Admitting: Emergency Medicine

## 2018-04-02 ENCOUNTER — Other Ambulatory Visit: Payer: Self-pay

## 2018-04-02 DIAGNOSIS — R072 Precordial pain: Secondary | ICD-10-CM

## 2018-04-02 DIAGNOSIS — I2510 Atherosclerotic heart disease of native coronary artery without angina pectoris: Secondary | ICD-10-CM | POA: Insufficient documentation

## 2018-04-02 DIAGNOSIS — N189 Chronic kidney disease, unspecified: Secondary | ICD-10-CM | POA: Insufficient documentation

## 2018-04-02 DIAGNOSIS — Z23 Encounter for immunization: Secondary | ICD-10-CM | POA: Insufficient documentation

## 2018-04-02 DIAGNOSIS — R0789 Other chest pain: Principal | ICD-10-CM | POA: Insufficient documentation

## 2018-04-02 DIAGNOSIS — R079 Chest pain, unspecified: Secondary | ICD-10-CM | POA: Diagnosis present

## 2018-04-02 DIAGNOSIS — Z8673 Personal history of transient ischemic attack (TIA), and cerebral infarction without residual deficits: Secondary | ICD-10-CM | POA: Insufficient documentation

## 2018-04-02 DIAGNOSIS — I255 Ischemic cardiomyopathy: Secondary | ICD-10-CM | POA: Diagnosis present

## 2018-04-02 DIAGNOSIS — Z87891 Personal history of nicotine dependence: Secondary | ICD-10-CM | POA: Diagnosis not present

## 2018-04-02 DIAGNOSIS — Z7982 Long term (current) use of aspirin: Secondary | ICD-10-CM | POA: Insufficient documentation

## 2018-04-02 DIAGNOSIS — Z79899 Other long term (current) drug therapy: Secondary | ICD-10-CM | POA: Insufficient documentation

## 2018-04-02 DIAGNOSIS — I6340 Cerebral infarction due to embolism of unspecified cerebral artery: Secondary | ICD-10-CM | POA: Diagnosis present

## 2018-04-02 DIAGNOSIS — I129 Hypertensive chronic kidney disease with stage 1 through stage 4 chronic kidney disease, or unspecified chronic kidney disease: Secondary | ICD-10-CM | POA: Insufficient documentation

## 2018-04-02 HISTORY — DX: Acute myocardial infarction, unspecified: I21.9

## 2018-04-02 HISTORY — DX: Unspecified osteoarthritis, unspecified site: M19.90

## 2018-04-02 HISTORY — DX: Heart failure, unspecified: I50.9

## 2018-04-02 HISTORY — DX: Angina pectoris, unspecified: I20.9

## 2018-04-02 LAB — I-STAT TROPONIN, ED: Troponin i, poc: 0 ng/mL (ref 0.00–0.08)

## 2018-04-02 LAB — URINALYSIS, ROUTINE W REFLEX MICROSCOPIC
Bilirubin Urine: NEGATIVE
Glucose, UA: NEGATIVE mg/dL
Hgb urine dipstick: NEGATIVE
Ketones, ur: NEGATIVE mg/dL
Leukocytes, UA: NEGATIVE
Nitrite: NEGATIVE
Protein, ur: NEGATIVE mg/dL
Specific Gravity, Urine: 1.025 (ref 1.005–1.030)
pH: NEGATIVE (ref 5.0–8.0)

## 2018-04-02 LAB — BASIC METABOLIC PANEL
Anion gap: 12 (ref 5–15)
BUN: 36 mg/dL — ABNORMAL HIGH (ref 8–23)
CO2: 22 mmol/L (ref 22–32)
Calcium: 9.6 mg/dL (ref 8.9–10.3)
Chloride: 99 mmol/L (ref 98–111)
Creatinine, Ser: 1.53 mg/dL — ABNORMAL HIGH (ref 0.61–1.24)
GFR calc Af Amer: 54 mL/min — ABNORMAL LOW (ref >60)
GFR calc non Af Amer: 47 mL/min — ABNORMAL LOW (ref >60)
Glucose, Bld: 98 mg/dL (ref 70–99)
Potassium: 5 mmol/L (ref 3.5–5.1)
Sodium: 133 mmol/L — ABNORMAL LOW (ref 135–145)

## 2018-04-02 LAB — RAPID URINE DRUG SCREEN, HOSP PERFORMED
Amphetamines: NOT DETECTED
Barbiturates: NOT DETECTED
Benzodiazepines: NOT DETECTED
Cocaine: NOT DETECTED
Opiates: NOT DETECTED
Tetrahydrocannabinol: NOT DETECTED

## 2018-04-02 LAB — CBC
HCT: 38.1 % — ABNORMAL LOW (ref 39.0–52.0)
Hemoglobin: 11.7 g/dL — ABNORMAL LOW (ref 13.0–17.0)
MCH: 27.1 pg (ref 26.0–34.0)
MCHC: 30.7 g/dL (ref 30.0–36.0)
MCV: 88.2 fL (ref 80.0–100.0)
Platelets: 189 10*3/uL (ref 150–400)
RBC: 4.32 MIL/uL (ref 4.22–5.81)
RDW: 13.8 % (ref 11.5–15.5)
WBC: 5.9 10*3/uL (ref 4.0–10.5)
nRBC: 0 % (ref 0.0–0.2)

## 2018-04-02 LAB — GLUCOSE, CAPILLARY: Glucose-Capillary: 171 mg/dL — ABNORMAL HIGH (ref 70–99)

## 2018-04-02 LAB — TROPONIN I
Troponin I: <0.03 ng/mL (ref <0.03)
Troponin I: <0.03 ng/mL (ref <0.03)

## 2018-04-02 MED ORDER — ASPIRIN 81 MG PO CHEW
324.0000 mg | CHEWABLE_TABLET | Freq: Once | ORAL | Status: AC
  Administered 2018-04-02: 324 mg via ORAL
  Filled 2018-04-02: qty 4

## 2018-04-02 MED ORDER — ENOXAPARIN SODIUM 40 MG/0.4ML ~~LOC~~ SOLN
40.0000 mg | SUBCUTANEOUS | Status: DC
  Administered 2018-04-03: 40 mg via SUBCUTANEOUS
  Filled 2018-04-02: qty 0.4

## 2018-04-02 MED ORDER — ENSURE ENLIVE PO LIQD
237.0000 mL | Freq: Two times a day (BID) | ORAL | Status: DC
  Administered 2018-04-03 (×2): 237 mL via ORAL

## 2018-04-02 MED ORDER — ALUM HYDROXIDE-MAG TRISILICATE 80-20 MG PO CHEW
2.0000 | CHEWABLE_TABLET | Freq: Once | ORAL | Status: AC
  Administered 2018-04-02: 2 via ORAL
  Filled 2018-04-02 (×2): qty 2

## 2018-04-02 MED ORDER — ALUM HYDROXIDE-MAG CARBONATE 95-358 MG/15ML PO SUSP
30.0000 mL | Freq: Once | ORAL | Status: DC
  Filled 2018-04-02: qty 30

## 2018-04-02 MED ORDER — POLYETHYLENE GLYCOL 3350 17 G PO PACK
17.0000 g | PACK | Freq: Every day | ORAL | Status: DC
  Administered 2018-04-02: 17 g via ORAL
  Filled 2018-04-02 (×2): qty 1

## 2018-04-02 MED ORDER — SODIUM CHLORIDE 0.9 % IV SOLN
Freq: Once | INTRAVENOUS | Status: AC
  Administered 2018-04-02: 08:00:00 via INTRAVENOUS

## 2018-04-02 MED ORDER — ASPIRIN EC 81 MG PO TBEC
81.0000 mg | DELAYED_RELEASE_TABLET | Freq: Every day | ORAL | Status: DC
  Administered 2018-04-03: 81 mg via ORAL
  Filled 2018-04-02: qty 1

## 2018-04-02 MED ORDER — INFLUENZA VAC SPLIT HIGH-DOSE 0.5 ML IM SUSY
0.5000 mL | PREFILLED_SYRINGE | INTRAMUSCULAR | Status: AC
  Administered 2018-04-03: 0.5 mL via INTRAMUSCULAR
  Filled 2018-04-02 (×2): qty 0.5

## 2018-04-02 MED ORDER — NITROGLYCERIN 2 % TD OINT
0.5000 [in_us] | TOPICAL_OINTMENT | Freq: Once | TRANSDERMAL | Status: AC
  Administered 2018-04-02: 0.5 [in_us] via TOPICAL
  Filled 2018-04-02: qty 1

## 2018-04-02 MED ORDER — ACETAMINOPHEN 325 MG PO TABS
650.0000 mg | ORAL_TABLET | Freq: Four times a day (QID) | ORAL | Status: DC | PRN
  Administered 2018-04-02: 650 mg via ORAL
  Filled 2018-04-02: qty 2

## 2018-04-02 MED ORDER — ACETAMINOPHEN 650 MG RE SUPP: 650.0000 mg | Freq: Four times a day (QID) | RECTAL | Status: DC | PRN

## 2018-04-02 MED ORDER — FAMOTIDINE IN NACL 20-0.9 MG/50ML-% IV SOLN
20.0000 mg | Freq: Once | INTRAVENOUS | Status: AC
  Administered 2018-04-02: 20 mg via INTRAVENOUS
  Filled 2018-04-02: qty 50

## 2018-04-02 MED ORDER — ALUM HYDROXIDE-MAG CARBONATE 95-358 MG/15ML PO SUSP
30.0000 mL | ORAL | Status: DC | PRN
  Filled 2018-04-02 (×2): qty 30

## 2018-04-02 NOTE — ED Notes (Signed)
Breakfast at bedside.

## 2018-04-02 NOTE — ED Notes (Signed)
Patient transported to X-ray 

## 2018-04-02 NOTE — ED Triage Notes (Signed)
BIB [facility] from home with c/o chest pain starting @ 0230. Described as a 5/10 pressure in the mid chest.  Pt took 2 nitro without relief.

## 2018-04-02 NOTE — Progress Notes (Signed)
Pt complained of chest pain 8/10. Chest pain was 2/10 upon admission to [facility]. Pain is in center of chest ,reproducible ,and occurred 30 minutes after eating a [place] sandwich and drinking cranapple juice. EKG with no changes from prior EKG. VSS, bp 104.68, heart rate 80,o2 sats 100% on room air.. Md called,ordered pecid iv and gaviscon po. meds administered, continue to monitor.Melodye Ped

## 2018-04-02 NOTE — ED Notes (Signed)
Admitting at bedside 

## 2018-04-02 NOTE — Progress Notes (Signed)
Assessment:  Called to bedside for some increase in Randall Patel chest pain.  His pain was nonradiating he describes it as substernal, sharp and unrelenting.  This seemed to have occurred shortly after eating a sandwich and drinking some juice.  This before our arrival an EKG was performed which showed no change from his prior no signs of ACS.  He was saturating well on 100% on room air, his lungs were clear to auscultation bilaterally he had no increased work of breathing.  His heart rate is normal, no new murmurs were auscultated clear S1-S2 without any gallops.  He did demonstrate epigastric tenderness to palpation.  His abdomen was not distended and he had normal bowel sounds.    Plan:   -As his chest pain seems atypical in nature and more related to a possible underlying GI process we will treat initially with an antacid therapy.  We are avoiding nitro as his blood pressure has been on the softer side, does not seem like it would help much in this case.  Of note he took all his blood pressure medicines earlier today and received nitro paste.  His baseline blood pressure seems to be about 10-48mmHg higher at his office visits than it is this evening -He has 2- troponins so far which have been negative a third is in process which we will follow -We will repeat another EKG tonight and in the morning -An echocardiogram was also ordered earlier today which we will review when available -We will continue to follow closely

## 2018-04-02 NOTE — ED Notes (Signed)
Nurse starting IV and drawing labs. 

## 2018-04-02 NOTE — ED Provider Notes (Signed)
[facility] EMERGENCY DEPARTMENT Provider Note   CSN: [id number] Arrival date & time: 04/02/18  4098     History   Chief Complaint Chief Complaint  Patient presents with  . Chest Pain    HPI Randall Patel is a 67 y.o. male.  HPI  This is a 67 year old male with a history of coronary artery disease, CVA, hypertension, hyperlipidemia, ischemic cardiomyopathy who presents with chest pain.  Patient reports anterior chest pain that started at 2:30 AM.  He reports that it is nonradiating.  He describes it as pressure" somebody digging their fingers into my chest."  He states it feels similar to when he had his heart attack in 2006.  He took 2 nitroglycerin at home with no relief.  Denies any fevers or shortness of breath.  Denies any nausea or vomiting.  Denies any lower extremity swelling.  Denies any recent drug use.  Past Medical History:  Diagnosis Date  . CAD (coronary artery disease)    a. prior MI in 2006 b. 03/2012: 30% ostial LM stenosis, 30% pLAD stenosis, 60% mid-LADm 30% pCx, 3rd OM which was small in size with diffuse 90% stenosis involving both branches, and RCA with 50% mid-stenosis and 100% distal occlusion. POBA to PL branch and DES placement to dRCA.  . Claudication (HCC)   . Coronary artery disease    MI in 2008 with PCI to ? vessel; NSTEMI 03/2012; NSTEMI 04/2012 Cath showed triple vessel dz including occluded RCA, s/p DES to distal RCA, PTCA posterolateral branch   . CVA (cerebral infarction)    03/2012 with left arm weakness/numbness and left facial droop.  [name] ETOH abuse    quit 04/06/12  . ETOH abuse   . History of stroke 03/2015   right frontal hemorrhage  . HTN (hypertension)   . Hyperlipidemia   . Hypertension   . Ischemic cardiomyopathy    04/2012 EF 35-40%  . Ischemic cardiomyopathy    a. EF 40-45% by echo in 03/2015  . Seizures (HCC)   . Stroke (HCC)   . Tobacco abuse    quit 04/06/12  . Tobacco abuse   . Withdrawal seizures  [facility])     Patient Active Problem List   Diagnosis Date Noted  . Anoxic brain injury (HCC) 03/31/2016  . Pulseless electrical activity (HCC)   . Chronic obstructive pulmonary disease (HCC)   . Dysphagia, post-stroke   . Abnormal CT of brain   . Coronary artery disease involving native coronary artery of native heart without angina pectoris   . ETOH abuse   . Tachypnea   . Benign essential HTN   . Hyperglycemia   . Pain   . Leukocytosis   . Acute pulmonary edema (HCC)   . Respiratory failure (HCC)   . Sinus tachycardia   . Cardiac arrest (HCC) 03/23/2016  . Palpitations 08/15/2015  . History of stroke 08/15/2015  . Anterior cerebral circulation hemorrhagic infarction (HCC) 08/15/2015  . Hyperlipidemia 08/15/2015  . Muscle stiffness   . Cough   . Hemiparesis affecting left side as late effect of stroke (HCC)   . Dysphagia as late effect of cerebrovascular disease   . Essential hypertension   . Acute blood loss anemia   . 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 Dysphagia   . Dysarthria due to cerebrovascular accident   . Diastolic dysfunction   . Tobacco abuse   . ETOH abuse   . History of CVA with  residual deficit   . Coronary artery disease involving native coronary artery of native heart with angina pectoris (HCC)   . Tachypnea   . Hypernatremia   . Hypokalemia   . Thrombocytopenia (HCC)   . Intracranial hemorrhage (HCC)   . Essential hypertension, malignant   . ICH (intracerebral hemorrhage) (HCC) 04/04/2015  . STEMI (ST elevation myocardial infarction) (HCC) 04/14/2012  . NSTEMI (non-ST elevated myocardial infarction) (HCC) 04/14/2012  . Cardiomyopathy, ischemic 04/14/2012  . Cerebral embolism with cerebral infarction (HCC) 04/03/2012  . Unspecified transient cerebral ischemia 04/02/2012  . Hemiplegia, unspecified, affecting nondominant side 04/02/2012  . CAD (coronary artery disease) s/p stent 2008 in [place] 04/02/2012  .  Hypertension     Past Surgical History:  Procedure Laterality Date  . CORONARY ANGIOPLASTY WITH STENT PLACEMENT  03/2012  . HERNIA REPAIR    . LEFT HEART CATHETERIZATION WITH CORONARY ANGIOGRAM N/A 04/11/2012   Procedure: LEFT HEART CATHETERIZATION WITH CORONARY ANGIOGRAM;  Surgeon: Kathleene Hazelhristopher D McAlhany, MD;  [facility];  Service: Cardiovascular;  Laterality: N/A;  . PERCUTANEOUS CORONARY INTERVENTION-BALLOON ONLY  04/11/2012   Procedure: PERCUTANEOUS CORONARY INTERVENTION-BALLOON ONLY;  Surgeon: Kathleene Hazelhristopher D McAlhany, MD;  [facility];  Service: Cardiovascular;;  RPLA  . PERCUTANEOUS CORONARY STENT INTERVENTION (PCI-S)  04/11/2012   Procedure: PERCUTANEOUS CORONARY STENT INTERVENTION (PCI-S);  Surgeon: Kathleene Hazelhristopher D McAlhany, MD;  [facility];  Service: Cardiovascular;;  Distal RCA        Home Medications    Prior to Admission medications   Medication Sig Start Date End Date Taking? Authorizing Provider  amLODipine (NORVASC) 10 MG tablet Take 1 tablet (10 mg total) by mouth daily. 04/14/16   Love, Evlyn KannerPamela S, PA-C  aspirin EC 325 MG tablet Take 1 tablet (325 mg total) by mouth daily. 09/07/16   Marvel PlanXu, Jindong, MD  atorvastatin (LIPITOR) 80 MG tablet Take 1 tablet (80 mg total) by mouth daily at 6 PM. 04/14/16   Love, Evlyn KannerPamela S, PA-C  carvedilol (COREG) 25 MG tablet  07/03/17   [provider]  docusate sodium (COLACE) 100 MG capsule Take 1 capsule (100 mg total) by mouth every 12 (twelve) hours. 04/26/16   Arby BarrettePfeiffer, Marcy, MD  fluticasone (FLONASE) 50 MCG/ACT nasal spray Place 2 sprays into both nostrils daily. 04/01/16   Kathlen ModyAkula, Vijaya, MD  folic acid (FOLVITE) 1 MG tablet Take 1 tablet (1 mg total) by mouth daily. 04/14/16   Love, Evlyn KannerPamela S, PA-C  hydrALAZINE (APRESOLINE) 50 MG tablet Take 1.5 tablets (75 mg total) by mouth 3 (three) times daily. 04/14/16   Love, Evlyn KannerPamela S, PA-C  losartan (COZAAR) 100 MG tablet  07/03/17   [provider]  metoprolol  (LOPRESSOR) 100 MG tablet Take 1 tablet (100 mg total) by mouth 2 (two) times daily. 04/14/16   Love, Evlyn KannerPamela S, PA-C  Multiple Vitamin (MULTIVITAMIN WITH MINERALS) TABS tablet Take 1 tablet by mouth daily. 04/01/16   Kathlen ModyAkula, Vijaya, MD  nitroGLYCERIN (NITROSTAT) 0.4 MG SL tablet Place 1 tablet (0.4 mg total) under the tongue every 5 (five) minutes as needed for chest pain (up to 3 doses). 04/14/12   Hope, Jessica A, PA-C  polyethylene glycol powder (GLYCOLAX/MIRALAX) powder Take 17 g by mouth daily. Until daily soft stools  OTC 10/31/17   Antony MaduraHumes, Kelly, PA-C  potassium chloride 20 MEQ/15ML (10%) SOLN Take 7.5 mLs (10 mEq total) by mouth daily. 04/15/16   Love, Evlyn KannerPamela S, PA-C  spironolactone (ALDACTONE) 25 MG tablet Take 1 tablet (25 mg  total) by mouth daily. 04/14/16   Love, Evlyn Kanner, PA-C  thiamine 100 MG tablet Take 1 tablet (100 mg total) by mouth daily. 04/14/16   Jacquelynn Cree, PA-C    Family History Family History  Problem Relation Age of Onset  . Heart attack Father   . Hypertension Father   . Cancer Mother   . Stroke Sister   . Hypertension Mother   . Hypertension Sister   . Hypertension Brother     Social History Social History   Tobacco Use  . Smoking status: Current Every Day Smoker    Packs/day: 0.50    Years: 40.00    Pack years: 20.00    Types: Cigarettes  . Smokeless tobacco: Never Used  Substance Use Topics  . Alcohol use: Yes    Comment: "bourbon"  . Drug use: No    Types: Benzodiazepines, Marijuana     Allergies   Lisinopril   Review of Systems Review of Systems  Constitutional: Negative for fever.  Respiratory: Negative for chest tightness and shortness of breath.   Cardiovascular: Positive for chest pain.  Gastrointestinal: Negative for abdominal pain, nausea and vomiting.  Genitourinary: Negative for dysuria.  Musculoskeletal: Negative for back pain.  All other systems reviewed and are negative.    Physical Exam Updated Vital Signs BP (!) 79/59    Pulse (!) 57   Temp (!) 97.5 F (36.4 C) (Oral)   Resp 18   Ht 1.6 m (5\' 3" )   Wt 58 kg   SpO2 100%   BMI 22.65 kg/m   Physical Exam Vitals signs and nursing note reviewed.  Constitutional:      Appearance: He is well-developed.  HENT:     Head: Normocephalic and atraumatic.  Eyes:     Pupils: Pupils are equal, round, and reactive to light.  Neck:     Musculoskeletal: Neck supple.  Cardiovascular:     Rate and Rhythm: Normal rate and regular rhythm.     Heart sounds: Normal heart sounds. No murmur.  Pulmonary:     Effort: Pulmonary effort is normal. No respiratory distress.     Breath sounds: Normal breath sounds. No wheezing.  Abdominal:     General: Bowel sounds are normal.     Palpations: Abdomen is soft.     Tenderness: There is no abdominal tenderness. There is no rebound.  Musculoskeletal:     Right lower leg: No edema.     Left lower leg: No edema.  Lymphadenopathy:     Cervical: No cervical adenopathy.  Skin:    General: Skin is warm and dry.  Neurological:     Mental Status: He is alert and oriented to person, place, and time.      ED Treatments / Results  Labs (all labs ordered are listed, but only abnormal results are displayed) Labs Reviewed  BASIC METABOLIC PANEL - Abnormal; Notable for the following components:      Result Value   Sodium 133 (*)    BUN 36 (*)    Creatinine, Ser 1.53 (*)    GFR calc non Af Amer 47 (*)    GFR calc Af Amer 54 (*)    All other components within normal limits  CBC - Abnormal; Notable for the following components:   Hemoglobin 11.7 (*)    HCT 38.1 (*)    All other components within normal limits  RAPID URINE DRUG SCREEN, HOSP PERFORMED  I-STAT TROPONIN, ED    EKG EKG Interpretation  Date/Time:  Sunday April 02 2018 05:28:21 EST Ventricular Rate:  53 PR Interval:    QRS Duration: 91 QT Interval:  432 QTC Calculation: 406 R Axis:   58 Text Interpretation:  Sinus rhythm Abnormal R-wave progression,  early transition <1 mm ST elevation V2-V4 T wave inversion inferiorly, unchanged Nondiagnostic, repeat in 10 min requested Confirmed by Ross Marcus ([id number]) on 04/02/2018 5:49:18 AM   Radiology Dg Chest 2 View  Result Date: 04/02/2018 CLINICAL DATA:  Acute onset of generalized chest pain. EXAM: CHEST - 2 VIEW COMPARISON:  None. FINDINGS: The lungs are well-aerated and clear. There is no evidence of focal opacification, pleural effusion or pneumothorax. The heart is normal in size; the mediastinal contour is within normal limits. No acute osseous abnormalities are seen. IMPRESSION: No acute cardiopulmonary process seen. Electronically Signed   By: Roanna Raider M.D.   On: 04/02/2018 06:07    Procedures Procedures (including critical care time)  Medications Ordered in ED Medications  nitroGLYCERIN (NITROGLYN) 2 % ointment 0.5 inch (0.5 inches Topical Given 04/02/18 0550)  aspirin chewable tablet 324 mg (324 mg Oral Given 04/02/18 0548)     Initial Impression / Assessment and Plan / ED Course  I have reviewed the triage vital signs and the nursing notes.  Pertinent labs & imaging results that were available during my care of the patient were reviewed by me and considered in my medical decision making (see chart for details).  Clinical Course as of Apr 02 720  Sun Apr 02, 2018  0814 Initial EKG with some nonspecific anterior ST changes less than 1 mm.  Nondiagnostic.  He does have an EKG from 2010 with similar elevation.  Repeat ordered.  Repeat with no significant change.  Patient is having ongoing pain.  We will discuss with cardiology.  He does not meet STEMI criteria at this time.   [CH]    Clinical Course User Index [CH] , Mayer Masker, MD    Patient presents with chest pain.  Onset this morning at 230.  Reports history of coronary artery disease.  EKG is nondiagnostic without dynamic changes.  This was reviewed by the cardiology fellow.  He agrees that this does not meet  STEMI criteria.  Patient's blood pressure is borderline.  Half inch of Nitropaste was applied.  Initial troponin is negative.  Lab work notable for creatinine of 1.5 which is new when compared to prior.  Will start fluids.  On recheck, patient reports that his pain is improved.  Given his risk factors we will plan for admission for chest pain rule out.  No risk factors for blood clots.  Chest x-ray shows no evidence of pneumothorax or pneumonia.  Final Clinical Impressions(s) / ED Diagnoses   Final diagnoses:  Precordial chest pain    ED Discharge Orders    None       , Mayer Masker, MD 04/02/18 [phone]

## 2018-04-02 NOTE — H&P (Signed)
Date: 04/02/2018               Patient Name:  Chaney MallingBernard Tryon MRN: [id number]  DOB: 1951-03-19 Age / Sex: 67 y.o., male   PCP: Grayce SessionsEdwards, Michelle P, NP         Medical Service: Internal Medicine Teaching Service         Attending Physician: Dr. Anne Shutteraines, Alexander N, MD    First Contact: Dr. Cleaster CorinSeawell Pager: [phone]  Second Contact: Dr. Frances FurbishWinfrey Pager: [phone]       After Hours (After 5p/  First Contact Pager: [phone]  weekends / holidays): Second Contact Pager: [phone]   Chief Complaint: chest pain  History of Present Illness:  Mr. Luciana AxeRankin is a 67yo male with history of CAD, multiple MI with stentx2, CVA, PEA arrest 2017, tobacco use, HTN, and HFrEF presenting today with central, non-radiating chest pain that started about 2am and awoke him from sleep. He states he took his nitro tab twice with no relief, and these usually help. He states the pain is similar to previous MI's. He denies nausea, radiating pain, or numbness and tingling. He denies SOB, dyspnea, recent illness, or sick contacts. He denies symptoms of reflux or burning pain and states he does not usually have reflux. He has some difficulty eating due to his dentures, but drinks lots of water. He has no trouble with urination but has had some constipation which he takes a laxative. He last took his medications this morning and states his blood pressure is usually high.    Social:  Smokes 1/3 ppd, drinks 3 beers per day, denies recreational drug use  He lives at home with his sister.   Family History:  Family History  Problem Relation Age of Onset  . Heart attack Father   . Hypertension Father   . Cancer Mother   . Stroke Sister   . Hypertension Mother   . Hypertension Sister   . Hypertension Brother      Meds:  Current Meds  Medication Sig  . aspirin EC 81 MG tablet Take 81 mg by mouth daily.  [name]. atorvastatin (LIPITOR) 80 MG tablet Take 1 tablet (80 mg total) by mouth daily at 6 PM.  . carvedilol (COREG) 25 MG  tablet Take 25 mg by mouth 2 (two) times daily with a meal.   . docusate sodium (COLACE) 100 MG capsule Take 1 capsule (100 mg total) by mouth every 12 (twelve) hours.  . hydrALAZINE (APRESOLINE) 50 MG tablet Take 1.5 tablets (75 mg total) by mouth 3 (three) times daily.  . irbesartan (AVAPRO) 150 MG tablet Take 150 mg by mouth daily.  . metoprolol (LOPRESSOR) 100 MG tablet Take 1 tablet (100 mg total) by mouth 2 (two) times daily.  . nitroGLYCERIN (NITROSTAT) 0.4 MG SL tablet Place 1 tablet (0.4 mg total) under the tongue every 5 (five) minutes as needed for chest pain (up to 3 doses).  . polyethylene glycol powder (GLYCOLAX/MIRALAX) powder Take 17 g by mouth daily. Until daily soft stools  OTC  . spironolactone (ALDACTONE) 25 MG tablet Take 1 tablet (25 mg total) by mouth daily.     Allergies: Allergies as of 04/02/2018 - Review Complete 04/02/2018  Allergen Reaction Noted  . Lisinopril Swelling and Other (See Comments) 02/02/2012   Past Medical History:  Diagnosis Date  . CAD (coronary artery disease)    a. prior MI in 2006 b. 03/2012: 30% ostial LM stenosis, 30% pLAD stenosis, 60% mid-LADm 30% pCx, 3rd  OM which was small in size with diffuse 90% stenosis involving both branches, and RCA with 50% mid-stenosis and 100% distal occlusion. POBA to PL branch and DES placement to dRCA.  . Claudication (HCC)   . Coronary artery disease    MI in 2008 with PCI to ? vessel; NSTEMI 03/2012; NSTEMI 04/2012 Cath showed triple vessel dz including occluded RCA, s/p DES to distal RCA, PTCA posterolateral branch   . CVA (cerebral infarction)    03/2012 with left arm weakness/numbness and left facial droop.  [name] ETOH abuse    quit 04/06/12  . ETOH abuse   . History of stroke 03/2015   right frontal hemorrhage  . HTN (hypertension)   . Hyperlipidemia   . Hypertension   . Ischemic cardiomyopathy    04/2012 EF 35-40%  . Ischemic cardiomyopathy    a. EF 40-45% by echo in 03/2015  . Seizures (HCC)     . Stroke (HCC)   . Tobacco abuse    quit 04/06/12  . Tobacco abuse   . Withdrawal seizures (HCC)      Review of Systems: A complete ROS was negative except as per HPI.   Physical Exam: Blood pressure 100/67, pulse (!) 49, temperature (!) 97.5 F (36.4 C), temperature source Oral, resp. rate 19, height 5\' 3"  (1.6 m), weight 58 kg, SpO2 100 %.  Physical Exam: Constitution: NAD, lying supine in bed HENT: wearing dentures, [place], AT Eyes: EOM intact, no scleral icterus Cardio: RRR, no m/r/g Respiratory: CTAB, no w/r/r Abdominal: NTTP, soft, non-distended, nml BS MSK: moving all extremities, no edema Neuro: a&o, cooperative  Skin: c/d/i    EKG: personally reviewed my interpretation is mild ST elevated V3, V4, q waves, NSR  CXR: personally reviewed my interpretation is no acute process  Assessment & Plan by Problem: Active Problems:   Chest pain  67yo male with history of CAD, multiple MI with stentx2, CVA, PEA arrest 2017, tobacco use, HTN, and HFrEF presenting today with central, non-radiating chest pain that started about 2am and awoke him from sleep  Atypical Chest Pain  Pain still persisting about 3/10 not relieved with nitro. Initial troponin negative but some ST elevation V3-V4. Case discussed with cardiology in the ED and felt not to meet criteria for STEMI. Patient states his chest pain is similar to previous MI, and as he has had multiple MI with stents and is high risk, will admit to rule out current ACS. He follows with Dr. Clifton James with cardiology and has f/u in Oct 2019. Blood pressure soft and patient states he is normally hypertensive. This may be secondary to taking his medications this morning and nitropaste, will continue to monitor.   - Troponinx3 q6h  - q6h EKG x2  - TSH, lipid panel, Hgba1c am  - CBC, CMP am - received asa 324 in the ED, cont home asa 81 mg  - telemetry  - nitro prn once blood pressure normalizes  - heart healthy diet - may need to switch  to soft  - repeat ECHO  HTN BP currently soft. Normally on norvasc 10mg , aldactone 25mg  qd, hydral 50mg  1.5 tid, coreg 25mg  bid, irbesartan 150 mg qd  - cont. Norvasc, irbesartan, aldactone, hydralazine, coreg once bp improves.   AKI Like prerenal as patient appears dry. Blood pressure currently soft, and while he states he does not check his blood pressure at home, he is usually hypertensive. This may be secondary to nitropaste and his medications, will continue to monitor.   -  NS 1L bolus  - am CMP  Constipation  - miralax qhs    Diet: heart healthy  VTE: lovenox IVF: 1L NS Code: full   Dispo: Admit patient to Observation with expected length of stay less than 2 midnights.  SignedVersie Starks, DO 04/02/2018, 8:51 AM  Pager: [phone]

## 2018-04-02 NOTE — ED Notes (Signed)
Discussed with EDP patient BP with nitro paste, She advised to remove.

## 2018-04-03 ENCOUNTER — Observation Stay (HOSPITAL_BASED_OUTPATIENT_CLINIC_OR_DEPARTMENT_OTHER): Payer: Medicare HMO

## 2018-04-03 ENCOUNTER — Encounter (HOSPITAL_COMMUNITY): Payer: Self-pay | Admitting: Physician Assistant

## 2018-04-03 DIAGNOSIS — R072 Precordial pain: Secondary | ICD-10-CM | POA: Diagnosis not present

## 2018-04-03 DIAGNOSIS — Z23 Encounter for immunization: Secondary | ICD-10-CM | POA: Diagnosis not present

## 2018-04-03 DIAGNOSIS — N179 Acute kidney failure, unspecified: Secondary | ICD-10-CM | POA: Diagnosis not present

## 2018-04-03 DIAGNOSIS — K219 Gastro-esophageal reflux disease without esophagitis: Secondary | ICD-10-CM | POA: Diagnosis not present

## 2018-04-03 DIAGNOSIS — I255 Ischemic cardiomyopathy: Secondary | ICD-10-CM

## 2018-04-03 DIAGNOSIS — I2510 Atherosclerotic heart disease of native coronary artery without angina pectoris: Secondary | ICD-10-CM | POA: Diagnosis not present

## 2018-04-03 DIAGNOSIS — Z79899 Other long term (current) drug therapy: Secondary | ICD-10-CM

## 2018-04-03 DIAGNOSIS — Z888 Allergy status to other drugs, medicaments and biological substances status: Secondary | ICD-10-CM

## 2018-04-03 DIAGNOSIS — R0789 Other chest pain: Secondary | ICD-10-CM

## 2018-04-03 DIAGNOSIS — Z8674 Personal history of sudden cardiac arrest: Secondary | ICD-10-CM

## 2018-04-03 DIAGNOSIS — I110 Hypertensive heart disease with heart failure: Secondary | ICD-10-CM

## 2018-04-03 DIAGNOSIS — I5020 Unspecified systolic (congestive) heart failure: Secondary | ICD-10-CM | POA: Diagnosis not present

## 2018-04-03 DIAGNOSIS — I129 Hypertensive chronic kidney disease with stage 1 through stage 4 chronic kidney disease, or unspecified chronic kidney disease: Secondary | ICD-10-CM | POA: Diagnosis not present

## 2018-04-03 DIAGNOSIS — Z720 Tobacco use: Secondary | ICD-10-CM

## 2018-04-03 DIAGNOSIS — Z955 Presence of coronary angioplasty implant and graft: Secondary | ICD-10-CM

## 2018-04-03 LAB — CBC
HCT: 33.6 % — ABNORMAL LOW (ref 39.0–52.0)
Hemoglobin: 10.8 g/dL — ABNORMAL LOW (ref 13.0–17.0)
MCH: 27.8 pg (ref 26.0–34.0)
MCHC: 32.1 g/dL (ref 30.0–36.0)
MCV: 86.6 fL (ref 80.0–100.0)
Platelets: 189 10*3/uL (ref 150–400)
RBC: 3.88 MIL/uL — ABNORMAL LOW (ref 4.22–5.81)
RDW: 13.7 % (ref 11.5–15.5)
WBC: 6.4 10*3/uL (ref 4.0–10.5)
nRBC: 0 % (ref 0.0–0.2)

## 2018-04-03 LAB — LIPID PANEL
Cholesterol: 161 mg/dL (ref 0–200)
HDL: 72 mg/dL (ref >40)
LDL Cholesterol: 71 mg/dL (ref 0–99)
Total CHOL/HDL Ratio: 2.2 RATIO
Triglycerides: 91 mg/dL (ref <150)
VLDL: 18 mg/dL (ref 0–40)

## 2018-04-03 LAB — ECHOCARDIOGRAM COMPLETE
Height: 63 in
Weight: 2025.6 oz

## 2018-04-03 LAB — HIV ANTIBODY (ROUTINE TESTING W REFLEX): HIV Screen 4th Generation wRfx: NONREACTIVE

## 2018-04-03 LAB — GLUCOSE, CAPILLARY
Glucose-Capillary: 100 mg/dL — ABNORMAL HIGH (ref 70–99)
Glucose-Capillary: 104 mg/dL — ABNORMAL HIGH (ref 70–99)

## 2018-04-03 LAB — TSH: TSH: 0.732 u[IU]/mL (ref 0.350–4.500)

## 2018-04-03 LAB — COMPREHENSIVE METABOLIC PANEL
ALT: 12 U/L (ref 0–44)
AST: 14 U/L — ABNORMAL LOW (ref 15–41)
Albumin: 3.6 g/dL (ref 3.5–5.0)
Alkaline Phosphatase: 27 U/L — ABNORMAL LOW (ref 38–126)
Anion gap: 12 (ref 5–15)
BUN: 19 mg/dL (ref 8–23)
CO2: 21 mmol/L — ABNORMAL LOW (ref 22–32)
Calcium: 9.3 mg/dL (ref 8.9–10.3)
Chloride: 102 mmol/L (ref 98–111)
Creatinine, Ser: 1.1 mg/dL (ref 0.61–1.24)
GFR calc Af Amer: >60 mL/min (ref >60)
GFR calc non Af Amer: >60 mL/min (ref >60)
Glucose, Bld: 95 mg/dL (ref 70–99)
Potassium: 4.5 mmol/L (ref 3.5–5.1)
Sodium: 135 mmol/L (ref 135–145)
Total Bilirubin: 0.7 mg/dL (ref 0.3–1.2)
Total Protein: 6.6 g/dL (ref 6.5–8.1)

## 2018-04-03 LAB — TROPONIN I
Troponin I: <0.03 ng/mL (ref <0.03)
Troponin I: <0.03 ng/mL (ref <0.03)

## 2018-04-03 LAB — HEMOGLOBIN A1C
Hgb A1c MFr Bld: 5.6 % (ref 4.8–5.6)
Mean Plasma Glucose: 114.02 mg/dL

## 2018-04-03 MED ORDER — CALCIUM CARBONATE ANTACID 750 MG PO CHEW: 1.0000 | CHEWABLE_TABLET | Freq: Three times a day (TID) | ORAL | 0 refills | Status: DC | PRN

## 2018-04-03 MED ORDER — PANTOPRAZOLE SODIUM 40 MG PO TBEC: 40.0000 mg | DELAYED_RELEASE_TABLET | Freq: Every day | ORAL | 0 refills | Status: AC

## 2018-04-03 MED ORDER — PANTOPRAZOLE SODIUM 40 MG PO TBEC
40.0000 mg | DELAYED_RELEASE_TABLET | Freq: Every day | ORAL | Status: DC
  Administered 2018-04-03: 40 mg via ORAL
  Filled 2018-04-03: qty 1

## 2018-04-03 MED ORDER — SODIUM CHLORIDE 0.9 % IV BOLUS: 500.0000 mL | Freq: Once | INTRAVENOUS | Status: DC

## 2018-04-03 MED ORDER — ENSURE ENLIVE PO LIQD: 237.0000 mL | Freq: Two times a day (BID) | ORAL | 12 refills | Status: DC

## 2018-04-03 NOTE — Consult Note (Addendum)
Cardiology Consultation:   Patient ID: Randall Patel; [id number]; May 27, 1950   Admit date: 04/02/2018 Date of Consult: 04/03/2018  Primary Care Provider: Grayce Sessions, NP Primary Cardiologist: Verne Carrow, MD 05/30/2017 Primary Electrophysiologist:  None   Patient Profile:   Randall Patel is a 67 y.o. male with a hx of MI 2008 (in [place]) w/ PL stent, MI 2013 w/ DES RCA, POBA PL branch and med rx for small 90% OM3, ICH 03/2015, HTN, HLD, tob use, who is being seen today for the evaluation of chest pain at the request of Dr Frances Furbish.  History of Present Illness:   Randall Patel was stable from a cardiac standpoint when seen by Dr Clifton James 05/2017.  He states he gets chest pain every few months and has to take 1 sublingual nitroglycerin which relieves the pain.  His activity level is limited because of the previous strokes.  He does not have a history of exertional symptoms.  At approximately 2 AM on 12/22, he was awakened by chest pain.  It is a hard and sharp pain, in the center of his chest.  It is worse if he lays on his left side and worse with deep inspiration.  He feels a little short of breath with it.  There has been no nausea, vomiting, or diaphoresis.  It is like his MI pain.  He took a nitroglycerin at 230 aM and at 257.  The nitroglycerin did not relieve the pain.  He called EMS and was brought to the emergency room.  In the emergency room, he was given aspirin 324 mg, nitroglycerin paste, also without relieving his pain.  He appeared dehydrated and was given IV fluids.  His blood pressure was low.  He was constipated and was given MiraLAX.  There was some concern that his symptoms were GI in origin.  He was given Gaviscon 80-20 and IV Pepcid.  His pain has gone from an 8/10 down to 2-3/10.  He just ate breakfast, but does not think that made any difference in his pain.   Past Medical History:  Diagnosis Date  . Anginal pain (HCC)   . Arthritis   . CAD (coronary  artery disease)    a. MI in 2008 w PL stent b. MI 03/2012: 30% ostial LM stenosis, 30% pLAD stenosis, 60% mid-LADm 30% pCx, 3rd OM which was small in size with diffuse 90% stenosis involving both branches, and RCA with 50% mid-stenosis and 100% distal occlusion. POBA to PL branch and DES placement to dRCA.  [name] CHF (congestive heart failure) (HCC)   . Claudication (HCC)   . CVA (cerebral infarction)    03/2012 with left arm weakness/numbness and left facial droop.  [name] ETOH abuse    quit 04/06/12  . HTN (hypertension)   . Hyperlipidemia   . Ischemic cardiomyopathy    a. EF 35-40% by echo 2014 b.EF 40-45% by echo in 03/2015  . Myocardial infarction (HCC)   . Seizures (HCC)   . Stroke (HCC) 03/2015   R frontal hemorrhage  . Tobacco abuse    quit 04/06/12  . Withdrawal seizures [facility])     Past Surgical History:  Procedure Laterality Date  . CORONARY ANGIOPLASTY WITH STENT PLACEMENT  03/2012  . HERNIA REPAIR    . LEFT HEART CATHETERIZATION WITH CORONARY ANGIOGRAM N/A 04/11/2012   Procedure: LEFT HEART CATHETERIZATION WITH CORONARY ANGIOGRAM;  Surgeon: Kathleene Hazel, MD;  Location: [facility] CATH LAB;  Service: Cardiovascular;  Laterality: N/A;  . PERCUTANEOUS CORONARY INTERVENTION-BALLOON  ONLY  04/11/2012   Procedure: PERCUTANEOUS CORONARY INTERVENTION-BALLOON ONLY;  Surgeon: Kathleene Hazelhristopher D McAlhany, MD;  [facility];  Service: Cardiovascular;;  RPLA  . PERCUTANEOUS CORONARY STENT INTERVENTION (PCI-S)  04/11/2012   Procedure: PERCUTANEOUS CORONARY STENT INTERVENTION (PCI-S);  Surgeon: Kathleene Hazelhristopher D McAlhany, MD;  [facility];  Service: Cardiovascular;;  Distal RCA     Prior to Admission medications   Medication Sig Start Date End Date Taking? Authorizing Provider  aspirin EC 81 MG tablet Take 81 mg by mouth daily.   Yes [provider]  atorvastatin (LIPITOR) 80 MG tablet Take 1 tablet (80 mg total) by mouth daily at 6 PM. 04/14/16  Yes Love, Evlyn KannerPamela S, PA-C    carvedilol (COREG) 25 MG tablet Take 25 mg by mouth 2 (two) times daily with a meal.  07/03/17  Yes [provider]  docusate sodium (COLACE) 100 MG capsule Take 1 capsule (100 mg total) by mouth every 12 (twelve) hours. 04/26/16  Yes Arby BarrettePfeiffer, Marcy, MD  hydrALAZINE (APRESOLINE) 50 MG tablet Take 1.5 tablets (75 mg total) by mouth 3 (three) times daily. 04/14/16  Yes Love, Evlyn KannerPamela S, PA-C  irbesartan (AVAPRO) 150 MG tablet Take 150 mg by mouth daily. 01/25/18  Yes [provider]  metoprolol (LOPRESSOR) 100 MG tablet Take 1 tablet (100 mg total) by mouth 2 (two) times daily. 04/14/16  Yes Love, Evlyn KannerPamela S, PA-C  nitroGLYCERIN (NITROSTAT) 0.4 MG SL tablet Place 1 tablet (0.4 mg total) under the tongue every 5 (five) minutes as needed for chest pain (up to 3 doses). 04/14/12  Yes Hope, Jessica A, PA-C  polyethylene glycol powder (GLYCOLAX/MIRALAX) powder Take 17 g by mouth daily. Until daily soft stools  OTC 10/31/17  Yes Antony MaduraHumes, Kelly, PA-C  spironolactone (ALDACTONE) 25 MG tablet Take 1 tablet (25 mg total) by mouth daily. 04/14/16  Yes Love, Evlyn KannerPamela S, PA-C  amLODipine (NORVASC) 10 MG tablet Take 1 tablet (10 mg total) by mouth daily. Patient not taking: Reported on 04/02/2018 04/14/16   Love, Evlyn KannerPamela S, PA-C  aspirin EC 325 MG tablet Take 1 tablet (325 mg total) by mouth daily. Patient not taking: Reported on 04/02/2018 09/07/16   Marvel PlanXu, Jindong, MD  fluticasone [facility](FLONASE) 50 MCG/ACT nasal spray Place 2 sprays into both nostrils daily. Patient not taking: Reported on 04/02/2018 04/01/16   Kathlen ModyAkula, Vijaya, MD  folic acid (FOLVITE) 1 MG tablet Take 1 tablet (1 mg total) by mouth daily. Patient not taking: Reported on 04/02/2018 04/14/16   Jacquelynn CreeLove, Pamela S, PA-C  Multiple Vitamin (MULTIVITAMIN WITH MINERALS) TABS tablet Take 1 tablet by mouth daily. Patient not taking: Reported on 04/02/2018 04/01/16   Kathlen ModyAkula, Vijaya, MD  potassium chloride 20 MEQ/15ML (10%) SOLN Take 7.5 mLs (10 mEq total) by mouth  daily. Patient not taking: Reported on 04/02/2018 04/15/16   Love, Evlyn KannerPamela S, PA-C  thiamine 100 MG tablet Take 1 tablet (100 mg total) by mouth daily. Patient not taking: Reported on 04/02/2018 04/14/16   Jacquelynn CreeLove, Pamela S, PA-C    Inpatient Medications: Scheduled Meds: . aspirin EC  81 mg Oral Daily  . enoxaparin (LOVENOX) injection  40 mg Subcutaneous Q24H  . feeding supplement (ENSURE ENLIVE)  237 mL Oral BID BM  . Influenza vac split quadrivalent PF  0.5 mL Intramuscular Tomorrow-1000  . polyethylene glycol  17 g Oral QHS   Continuous Infusions:  PRN Meds: acetaminophen **OR** acetaminophen, aluminum hydroxide-magnesium carbonate  Allergies:    Allergies  Allergen Reactions  . Lisinopril  Swelling and Other (See Comments)    angioedema    Social History:   Social History   Socioeconomic History  . Marital status: Single    Spouse name: Not on file  . Number of children: Not on file  . Years of education: Not on file  . Highest education level: Not on file  Occupational History  . Occupation: Disabled  Social Needs  . Financial resource strain: Not very hard  . Food insecurity:    Worry: Never true    Inability: Never true  . Transportation needs:    Medical: Patient refused    Non-medical: Patient refused  Tobacco Use  . Smoking status: Current Every Day Smoker    Packs/day: 0.50    Years: 40.00    Pack years: 20.00    Types: Cigarettes  . Smokeless tobacco: Never Used  Substance and Sexual Activity  . Alcohol use: Yes    Comment: "bourbon"  . Drug use: No    Types: Benzodiazepines, Marijuana  . Sexual activity: Never  Lifestyle  . Physical activity:    Days per week: Patient refused    Minutes per session: Patient refused  . Stress: Not at all  Relationships  . Social connections:    Talks on phone: Three times a week    Gets together: Twice a week    Attends religious service: Never    Active member of club or organization: Not on file    Attends  meetings of clubs or organizations: Never    Relationship status: Never married  . Intimate partner violence:    Fear of current or ex partner: Patient refused    Emotionally abused: Patient refused    Physically abused: Patient refused    Forced sexual activity: Patient refused  Other Topics Concern  . Not on file  Social History Narrative   Merged History Encounter    Patient lives with his sister, who cares for him.        Family History:   Family History  Problem Relation Age of Onset  . Heart attack Father   . Hypertension Father   . Cancer Mother   . Stroke Sister   . Hypertension Mother   . Hypertension Sister   . Hypertension Brother    Family Status:  Family Status  Relation Name Status  . Father  Deceased  . Mother  Deceased  . Sister  (Not Specified)  . Sister  Alive  . Brother  Deceased  . Sister  Alive  . Sister  Alive  . Brother  Alive  . Brother  Alive  . MGM  Deceased  . MGF  Deceased  . PGM  Deceased  . PGF  Deceased  . Sister  (Not Specified)  . Brother  (Not Specified)    ROS:  Please see the history of present illness.  All other ROS reviewed and negative.     Physical Exam/Data:   Vitals:   04/02/18 1617 04/02/18 2046 04/03/18 0625 04/03/18 0728  BP: 104/68 104/74 (!) 96/59 (!) 90/57  Pulse:  77  65  Resp:  16    Temp:  99.5 F (37.5 C) 98.8 F (37.1 C) 98.8 F (37.1 C)  TempSrc:  Oral Oral Oral  SpO2: 100% 100% 100% 100%  Weight:   57.4 kg   Height:        Intake/Output Summary (Last 24 hours) at 04/03/2018 0848 Last data filed at 04/03/2018 0300 Gross per 24 hour  Intake  1120 ml  Output 775 ml  Net 345 ml   Filed Weights   04/02/18 0528 04/02/18 1501 04/03/18 0625  Weight: 58 kg 58.3 kg 57.4 kg   Body mass index is 22.43 kg/m.  General:  Well nourished, well developed, in no acute distress HEENT: normal Lymph: no adenopathy Neck: no JVD Endocrine:  No thryomegaly Vascular: No carotid bruits; 4/4 extremity  pulses 2+, without bruits  Cardiac:  normal S1, S2; RRR; no murmur  Lungs:  clear to auscultation bilaterally, no wheezing, rhonchi or rales  Abd: soft, nontender, no hepatomegaly  Ext: no edema Musculoskeletal:  No deformities, BUE and BLE strength normal and equal Skin: warm and dry  Neuro:  CNs 2-12 intact, chronic movement deficits are noted  EKG:  The EKG was personally reviewed and demonstrates: 12/23 ECG is sinus rhythm, heart rate 64, early re-pole lateral leads that is similar to 12/22 ECG but different from 2017 ECG. Telemetry:  Telemetry was personally reviewed and demonstrates: Sinus rhythm  Relevant CV Studies:  Echo: 03/26/16: Left ventricle: The cavity size was normal. There was mild concentric hypertrophy. Systolic function was mildly reduced. The estimated ejection fraction was in the range of 45% to 50%. Hypokinesis of the inferior and inferoseptal myocardium. Doppler parameters are consistent with abnormal left ventricular relaxation (grade 1 diastolic dysfunction). - Aortic valve: There was no regurgitation. - Mitral valve: Transvalvular velocity was within the normal range. There was no evidence for stenosis. There was mild regurgitation directed posteriorly. - Right ventricle: The cavity size was normal. Wall thickness was normal. Systolic function was normal. - Tricuspid valve: There was mild regurgitation. - Pulmonary arteries: Systolic pressure was moderately increased. PA peak pressure: 51 mm Hg (S). - Pericardium, extracardiac: A trivial pericardial effusion was identified.  Cardiac cath 04/11/12: Left main: Ostial 30% stenosis with heavy calcification.  Left Anterior Descending Artery: Large caliber vessel that courses to the apex. The proximal vessel has 30% stenosis. The mid vessel has a 60% stenosis at the takeoff of a septal perforating branch. Moderate caliber diagonal branch with mild plaque disease.  Circumflex Artery:  Moderate caliber vessel. The proximal vessel has diffuse 30% stenosis. The first 2 obtuse marginal branches are very small. The third obtuse marginal branch is a small bifurcating vessel with diffuse 90% stenosis involving both branches. (1.75 mm vessel)  Right Coronary Artery: Large, dominant artery with 50% mid stenosis and 100% distal occlusion.  Left Ventricular Angiogram: LVEF=30-35% with akinesis of the inferior wall.  Laboratory Data:  Chemistry Recent Labs  Lab 04/02/18 0541 04/03/18 0505  NA 133* 135  K 5.0 4.5  CL 99 102  CO2 22 21*  GLUCOSE 98 95  BUN 36* 19  CREATININE 1.53* 1.10  CALCIUM 9.6 9.3  GFRNONAA 47* >60  GFRAA 54* >60  ANIONGAP 12 12    Lab Results  Component Value Date   ALT 12 04/03/2018   AST 14 (L) 04/03/2018   ALKPHOS 27 (L) 04/03/2018   BILITOT 0.7 04/03/2018   Hematology Recent Labs  Lab 04/02/18 0541 04/03/18 0505  WBC 5.9 6.4  RBC 4.32 3.88*  HGB 11.7* 10.8*  HCT 38.1* 33.6*  MCV 88.2 86.6  MCH 27.1 27.8  MCHC 30.7 32.1  RDW 13.8 13.7  PLT 189 189   Cardiac Enzymes Recent Labs  Lab 04/02/18 1109 04/02/18 1648 04/02/18 2306 04/03/18 0505  TROPONINI <0.03 <0.03 <0.03 <0.03    Recent Labs  Lab 04/02/18 0545  TROPIPOC 0.00    TSH:  Lab Results  Component Value Date   TSH 0.732 04/02/2018   Lipids: Lab Results  Component Value Date   CHOL 161 04/02/2018   HDL 72 04/02/2018   LDLCALC 71 04/02/2018   TRIG 91 04/02/2018   CHOLHDL 2.2 04/02/2018   HgbA1c: Lab Results  Component Value Date   HGBA1C 5.6 04/02/2018   Magnesium:  Magnesium  Date Value Ref Range Status  03/30/2016 2.2 1.7 - 2.4 mg/dL Final     Radiology/Studies:  Dg Chest 2 View  Result Date: 04/02/2018 CLINICAL DATA:  Acute onset of generalized chest pain. EXAM: CHEST - 2 VIEW COMPARISON:  None. FINDINGS: The lungs are well-aerated and clear. There is no evidence of focal opacification, pleural effusion or pneumothorax. The heart is normal in  size; the mediastinal contour is within normal limits. No acute osseous abnormalities are seen. IMPRESSION: No acute cardiopulmonary process seen. Electronically Signed   By: Roanna Raider M.D.   On: 04/02/2018 06:07    Assessment and Plan:   Active Problems: 1.  Chest pain -History of CAD with known 90% OM 3 at cath in 2013 - However, symptoms have some atypical features with the chest pain being worse with deep inspiration and worse if he lays on his left side. - No response to nitroglycerin, but the patient says the chest pain is inside and is his heart. - Has not had any stress testing since his last cath in 2013 - Discuss with Dr. Rennis Golden if a Myoview is appropriate here.  2.  Hypotension: -Improved with hydration - Need to make sure he is eating and drinking adequately at home. - His weight is trending down, but BMI is still within normal limits at 22.  3.  Acute renal insufficiency: -Improved with hydration.  4.  Medication compliance: -On his current home med list, he has aspirin 81 mg and aspirin 325 mg. -He also has Spironolactone and irbesartan.  These are currently on hold, may need to discontinue.  For questions or updates, please contact [facility] Please consult [email or web address] for contact info under Cardiology/STEMI.   Melida Quitter, PA-C  04/03/2018 8:48 AM

## 2018-04-03 NOTE — Discharge Instructions (Signed)

## 2018-04-03 NOTE — Progress Notes (Signed)
   Subjective: Randall Patel was doing well today, NAD. He reports that he is still having some chest pain, it is a 2-3/10 at the moment, and improved after the GI cocktail last night. He reports that the pain is worse when laying flat.   Objective:  Vital signs in last 24 hours: Vitals:   04/02/18 1501 04/02/18 1617 04/02/18 2046 04/03/18 0625  BP: 116/75 104/68 104/74 (!) 96/59  Pulse: (!) 54  77   Resp: 16  16   Temp: 98.3 F (36.8 C)  99.5 F (37.5 C) 98.8 F (37.1 C)  TempSrc: Oral  Oral Oral  SpO2: 100% 100% 100% 100%  Weight: 58.3 kg   57.4 kg  Height: 5\' 3"  (1.6 m)       General: Well nourished, well appearing, NAD  Cardiac: RRR, normal S1, S2, no murmurs, rubs or gallops  Pulmonary: Lungs CTA bilaterally, no wheezing, rhonchi or rales  Abdomen: Soft, non-tender, +bowel sounds, no guarding or masses noted  Psychiatry: Normal mood and affect     Assessment/Plan:  Active Problems:   Chest pain  This is a 67 year old male with a history of CAD s/p multiple stents, PEA arrest in 2017, tobacco use, HTN, and HFrEF who presented with central, non-radiating chest pain that is atypical in nature. Troponin has been negative x3, EKG has showed no significant changes.   Atypical chest pain: Patient reports that pain has improved to a 2-3 out of 10 however is persistent.  Troponins have been negative x3 and has had persistent ST elevations in the precordial.  Cardiology had evaluated and does think that the EKG was consistent with a STEMI.  Echocardiogram was completed.  Patient was given fluids and MiraLAX.  This appears to be more GI given the improvement with Gaviscon and IV Pepcid.  -Echocardiogram shows EF 40 to 45% with diffuse akinesis. -CBC and BMP was unremarkable -Lipid was normal -Cardiology is consulted, they do not think any further ischemic work-up is indicated.  They recommend continuing to hold irbesartan and spironolactone until follow-up.  Patient has follow-up with  her McAlhany in October and they will try to arrange for earlier appointment.  Hypertension: Blood pressures have been on the lower side 6/57 today.  He normally takes Norvasc, Aldactone 25 mg, hydralazine 50 and Coreg 25 mg twice daily, and 1 to 2 g daily.  Occasions were held on admission blood pressures remain low.  Will continue to hold for now.  -Discontinue irbesartan and spironolactone on discharge  AKI: -Patient has been having good urine output.  Creatinine improved today to 1.1.  Code Status: FULL Dispo: Anticipated discharge is today.  Claudean Severance, MD 04/03/2018, 6:48 AM Pager: [phone]

## 2018-04-03 NOTE — Progress Notes (Signed)
  Echocardiogram 2D Echocardiogram has been performed.  Randall Patel 04/03/2018, 9:57 AM

## 2018-04-03 NOTE — Discharge Summary (Addendum)
Name: Randall Patel MRN: [id number] DOB: [birth year] 67 y.o. PCP: Kerin Perna, NP  Date of Admission: 04/02/2018  5:25 AM Date of Discharge: 04/03/2018 04/03/2018  4:09 PM Attending Physician: Oda Kilts, MD   Discharge Diagnosis: 1.  Atypical chest pain 2.  Hypertension 3.  AKI  Discharge Medications: Allergies as of 04/03/2018      Reactions   Lisinopril Swelling, Other (See Comments)   angioedema      Medication List    STOP taking these medications   amLODipine 10 MG tablet Commonly known as:  NORVASC   atorvastatin 80 MG tablet Commonly known as:  LIPITOR   carvedilol 25 MG tablet Commonly known as:  COREG   fluticasone 50 MCG/ACT nasal spray Commonly known as:  FLONASE   hydrALAZINE 50 MG tablet Commonly known as:  APRESOLINE   irbesartan 150 MG tablet Commonly known as:  AVAPRO   metoprolol tartrate 100 MG tablet Commonly known as:  LOPRESSOR   nitroGLYCERIN 0.4 MG SL tablet Commonly known as:  NITROSTAT   potassium chloride 20 MEQ/15ML (10%) Soln   spironolactone 25 MG tablet Commonly known as:  ALDACTONE     TAKE these medications   aspirin EC 81 MG tablet Take 81 mg by mouth daily. What changed:  Another medication with the same name was removed. Continue taking this medication, and follow the directions you see here.   calcium carbonate 750 MG chewable tablet Commonly known as:  TUMS EX Chew 1 tablet (750 mg total) by mouth 3 (three) times daily as needed for heartburn.   docusate sodium 100 MG capsule Commonly known as:  COLACE Take 1 capsule (100 mg total) by mouth every 12 (twelve) hours.   feeding supplement (ENSURE ENLIVE) Liqd Take 237 mLs by mouth 2 (two) times daily between meals.   folic acid 1 MG tablet Commonly known as:  FOLVITE Take 1 tablet (1 mg total) by mouth daily.   multivitamin with minerals Tabs tablet Take 1 tablet by mouth daily.   pantoprazole 40 MG tablet Commonly known as:   PROTONIX Take 1 tablet (40 mg total) by mouth daily. Start taking on:  April 04, 2018   polyethylene glycol powder powder Commonly known as:  GLYCOLAX/MIRALAX Take 17 g by mouth daily. Until daily soft stools  OTC   thiamine 100 MG tablet Take 1 tablet (100 mg total) by mouth daily.       Disposition and follow-up:   Mr.Oseias Kracke was discharged from [facility] in Good condition.  At the hospital follow up visit please address:  1.  Chest pain: Was atypical in nature, troponin and EKG was negative please ensure that patient cardiology appointment has moved up.  Thought to be GI in nature, discharged home on calcium carbonate and pantoprazole, please assess how this is working.  Patient was hypotensive admission, irbesartan and spironolactone were held.  Please reassess. AKI: Patient had a small increase in his creatinine which normalized, repeat BMP to ensure normalization.  2.  Labs / imaging needed at time of follow-up: BMP  3.  Pending labs/ test needing follow-up: None  Follow-up Appointments: Follow-up Information    Kerin Perna, NP Follow up in 2 week(s).   Specialty:  Internal Medicine Contact information: [place] [address] [phone]        Burnell Blanks, MD Follow up in 1 week(s).   Specialty:  Cardiology Contact information: [place] [address] [place] [facility] Course by problem list: 1.  Atypical chest pain: 67 year old male with history of coronary artery disease, status post stents, CVA, PEA arrest in 2017, hypertension, and HFrEF who came in with chest pain that was centrally located, nonradiating pain that awoke him from sleep. He took 2 nitro tabs with no relief.  Pain is worse when lying down. Troponin's were negative x3, EKG did show some mild ST elevations in leads V3 to V4.  Cardiology was consulted and did not feel like he met  the criteria for STEMI.  Repeat EKGs showed no acute changes.  Patient stated he having worsening chest pain later in the day of admission was atypical in nature.  Repeat EKG showed no significant findings. He had some mild epigastric pain with palpation. He was given GI cocktail and antacids which mildly improved the symptoms. Echocardiogram showed EF of 35 to 40% diffuse hypokinesis.  Cardiology does not think further ischemic work-up is necessary at this time.  Chest pain is thought to be related to a GI etiology such as GERD.  Patient was discharged home with calcium carbonate and pantoprazole.   2.  Hypertension: Patient was mildly hypotensive on admission, his statin and spironolactone were held.  Normally on norvasc 80m, aldactone 278mqd, hydral 5052m.5 tid, coreg 51m37md, irbesartan 150 mg qd, this was all held on admission and discontinued on discharge.    3.  AKI: Slight bump in creatinine, improved after a 1 L normal saline bolus.  Discharge Vitals:   BP 105/68 (BP Location: Right Arm)   Pulse 62   Temp 98.5 F (36.9 C) (Oral)   Resp 16   Ht _0  (1.6 m)   Wt 57.4 kg   SpO2 100%   BMI 22.43 kg/m   Pertinent Labs, Studies, and Procedures:  BMP Latest Ref Rng & Units 04/03/2018 04/02/2018 10/30/2017  Glucose 70 - 99 mg/dL 95 98 108(H)  BUN 8 - 23 mg/dL 19 36(H) 18  Creatinine 0.61 - 1.24 mg/dL 1.10 1.53(H) 0.95  Sodium 135 - 145 mmol/L 135 133(L) 138  Potassium 3.5 - 5.1 mmol/L 4.5 5.0 4.1  Chloride 98 - 111 mmol/L 102 99 104  CO2 22 - 32 mmol/L 21(L) 22 26  Calcium 8.9 - 10.3 mg/dL 9.3 9.6 9.4   CBC Latest Ref Rng & Units 04/03/2018 04/02/2018 10/30/2017  WBC 4.0 - 10.5 K/uL 6.4 5.9 4.3  Hemoglobin 13.0 - 17.0 g/dL 10.8(L) 11.7(L) 14.3  Hematocrit 39.0 - 52.0 % 33.6(L) 38.1(L) 43.6  Platelets 150 - 400 K/uL 189 189 216   Echocardiogram:  LV EF: 35% -   40%  ------------------------------------------------------------------- Study Conclusions  - Left ventricle:  Diffuse hypokinesis worse in the inferor wall.   The cavity size was moderately dilated. Wall thickness was   normal. Systolic function was moderately reduced. The estimated   ejection fraction was in the range of 35% to 40%. Left   ventricular diastolic function parameters were normal. - Mitral valve: Calcified annulus. Mildly thickened leaflets . - Atrial septum: No defect or patent foramen ovale was identified.  Discharge Instructions: Discharge Instructions    Diet - low sodium heart healthy   Complete by:  As directed    Discharge instructions   Complete by:  As directed    Mr. RankSolumhave looked into your chest pain while you have been here and we have found no evidence to  indicate that the pain is coming from a problem with your heart which is great news.  You did have some symptoms that are concerning for gastroesophageal reflux disease commonly called heartburn or acid reflux.  When acid damages the lining of your esophagus it can cause pain that may seem similar to the chest pain that you have had in the past.  We have started you on a medicine for this called Protonix and we will encourage you to follow-up with a gastroenterologist or a stomach doctor to evaluate this further.  Secondly, your blood pressure is lower than it has been in the past, it seems you are mildly dehydrated when you came in we gave you some IV fluids however your low blood pressure has persisted.  At this time is not safe for you to start back on your blood pressure medicines.  With the help of a home nurse we will need to slowly add back your blood pressure medicine starting with the metoprolol.  You also need to follow-up with your cardiologist in about 1 week so he can check on your medicines as well.  It would also be a good idea to have your primary care doctor follow-up with you in about 1 week they can get you a referral to gastroenterology to take a closer look at what may be causing your abdominal pain.    Increase activity slowly   Complete by:  As directed       Signed: Asencion Noble, MD 04/03/2018, 4:44 PM   Pager: [phone]    Internal Medicine Attending Note:  I saw and examined the patient on the day of discharge. I reviewed and agree with the discharge summary written by the house staff.  Lenice Pressman, M.D., Ph.D.

## 2018-06-21 ENCOUNTER — Ambulatory Visit: Payer: Medicare HMO | Admitting: Cardiovascular Disease

## 2018-06-21 NOTE — Progress Notes (Deleted)
No chief complaint on file.  History of Present Illness: 68 yo male with history of CAD, CVA , ischemic cardiomyopathy, etoh abuse and seizures here today for cardiac follow up. He had his first MI in 2008. He was admitted with bradycardia, chest pain and taken to cath lab as urgent NSTEMI 04/11/12 and was found to have a totally occluded distal RCA. This was treated with a drug eluting stent in the mid to distal RCA and angioplasty of the Posterolateral segment. His LVEF post MI was 35-40%. He was admitted to [facility] December 2016 with right frontoparietal IC hemorrhage in the setting of uncontrolled HTN. Echo during that admission demonstrated LVEF 40-45%.  There was concern for right frontal encephalomalacia and concern for embolic infarcts. Outpatient event monitor planned. He was discharged to inpatient rehabilitation and seen in follow up in our office March 2017. Repeat imaging demonstrated resolution of IC hemorrhage. ASA started in Neurology clinic. Cardiac event monitor May 2017 with sinus rhythm, no atrial fibrillation or heart block. Admitted to [facility] December 2017 with PEA arrest and found to have CHF and encephalopathy. He was admitted December 2019 with chest pain. Troponin was negative. He was hypotensive so his irbesartan and aldactone were held. Echo December 2019 with LVEF=35=40%. No significant MR.   He is here today for follow up. The patient denies any chest pain, dyspnea, palpitations, lower extremity edema, orthopnea, PND, dizziness, near syncope or syncope.   Primary Care Physician: Grayce Sessions, NP  Past Medical History:  Diagnosis Date  . Anginal pain (HCC)   . Arthritis   . CAD (coronary artery disease)    a. MI in 2008 w PL stent b. MI 03/2012: 30% ostial LM stenosis, 30% pLAD stenosis, 60% mid-LADm 30% pCx, 3rd OM which was small in size with diffuse 90% stenosis involving both branches, and RCA with 50% mid-stenosis and 100% distal occlusion. POBA to PL branch and  DES placement to dRCA.  [name] CHF (congestive heart failure) (HCC)   . Claudication (HCC)   . CVA (cerebral infarction)    03/2012 with left arm weakness/numbness and left facial droop.  [name] ETOH abuse    quit 04/06/12  . HTN (hypertension)   . Hyperlipidemia   . Ischemic cardiomyopathy    a. EF 35-40% by echo 2014 b.EF 40-45% by echo in 03/2015  . Myocardial infarction (HCC)   . Seizures (HCC)   . Stroke (HCC) 03/2015   R frontal hemorrhage  . Tobacco abuse    quit 04/06/12  . Withdrawal seizures [facility])     Past Surgical History:  Procedure Laterality Date  . CORONARY ANGIOPLASTY WITH STENT PLACEMENT  03/2012  . HERNIA REPAIR    . LEFT HEART CATHETERIZATION WITH CORONARY ANGIOGRAM N/A 04/11/2012   Procedure: LEFT HEART CATHETERIZATION WITH CORONARY ANGIOGRAM;  Surgeon: Kathleene Hazel, MD;  Location: [facility] CATH LAB;  Service: Cardiovascular;  Laterality: N/A;  . PERCUTANEOUS CORONARY INTERVENTION-BALLOON ONLY  04/11/2012   Procedure: PERCUTANEOUS CORONARY INTERVENTION-BALLOON ONLY;  Surgeon: Kathleene Hazel, MD;  Location: [facility] CATH LAB;  Service: Cardiovascular;;  RPLA  . PERCUTANEOUS CORONARY STENT INTERVENTION (PCI-S)  04/11/2012   Procedure: PERCUTANEOUS CORONARY STENT INTERVENTION (PCI-S);  Surgeon: Kathleene Hazel, MD;  Location: [facility] CATH LAB;  Service: Cardiovascular;;  Distal RCA    Current Outpatient Medications  Medication Sig Dispense Refill  . aspirin EC 81 MG tablet Take 81 mg by mouth daily.    . calcium carbonate (TUMS EX) 750 MG chewable tablet  Chew 1 tablet (750 mg total) by mouth 3 (three) times daily as needed for heartburn. 90 tablet 0  . docusate sodium (COLACE) 100 MG capsule Take 1 capsule (100 mg total) by mouth every 12 (twelve) hours. 60 capsule 0  . feeding supplement, ENSURE ENLIVE, (ENSURE ENLIVE) LIQD Take 237 mLs by mouth 2 (two) times daily between meals. 237 mL 12  . folic acid (FOLVITE) 1 MG tablet Take 1 tablet (1 mg total) by mouth  daily. (Patient not taking: Reported on 04/02/2018) 30 tablet 0  . Multiple Vitamin (MULTIVITAMIN WITH MINERALS) TABS tablet Take 1 tablet by mouth daily. (Patient not taking: Reported on 04/02/2018)    . pantoprazole (PROTONIX) 40 MG tablet Take 1 tablet (40 mg total) by mouth daily. 30 tablet 0  . polyethylene glycol powder (GLYCOLAX/MIRALAX) powder Take 17 g by mouth daily. Until daily soft stools  OTC 116 g 0  . thiamine 100 MG tablet Take 1 tablet (100 mg total) by mouth daily. (Patient not taking: Reported on 04/02/2018) 30 tablet 0   No current facility-administered medications for this visit.     Allergies  Allergen Reactions  . Lisinopril Swelling and Other (See Comments)    angioedema    Social History   Socioeconomic History  . Marital status: Single    Spouse name: Not on file  . Number of children: Not on file  . Years of education: Not on file  . Highest education level: Not on file  Occupational History  . Occupation: Disabled  Social Needs  . Financial resource strain: Not very hard  . Food insecurity:    Worry: Never true    Inability: Never true  . Transportation needs:    Medical: Patient refused    Non-medical: Patient refused  Tobacco Use  . Smoking status: Current Every Day Smoker    Packs/day: 0.50    Years: 40.00    Pack years: 20.00    Types: Cigarettes  . Smokeless tobacco: Never Used  Substance and Sexual Activity  . Alcohol use: Yes    Comment: "bourbon"  . Drug use: No    Types: Benzodiazepines, Marijuana  . Sexual activity: Never  Lifestyle  . Physical activity:    Days per week: Patient refused    Minutes per session: Patient refused  . Stress: Not at all  Relationships  . Social connections:    Talks on phone: Three times a week    Gets together: Twice a week    Attends religious service: Never    Active member of club or organization: Not on file    Attends meetings of clubs or organizations: Never    Relationship status:  Never married  . Intimate partner violence:    Fear of current or ex partner: Patient refused    Emotionally abused: Patient refused    Physically abused: Patient refused    Forced sexual activity: Patient refused  Other Topics Concern  . Not on file  Social History Narrative   Merged History Encounter    Patient lives with his sister, who cares for him.        Family History  Problem Relation Age of Onset  . Heart attack Father   . Hypertension Father   . Cancer Mother   . Stroke Sister   . Hypertension Mother   . Hypertension Sister   . Hypertension Brother     Review of Systems:  As stated in the HPI and otherwise negative.   There  were no vitals taken for this visit.  Physical Examination:  General: Well developed, well nourished, NAD  HEENT: OP clear, mucus membranes moist  SKIN: warm, dry. No rashes. Neuro: No focal deficits  Musculoskeletal: Muscle strength 5/5 all ext  Psychiatric: Mood and affect normal  Neck: No JVD, no carotid bruits, no thyromegaly, no lymphadenopathy.  Lungs:Clear bilaterally, no wheezes, rhonci, crackles Cardiovascular: Regular rate and rhythm. No murmurs, gallops or rubs. Abdomen:Soft. Bowel sounds present. Non-tender.  Extremities: No lower extremity edema. Pulses are 2 + in the bilateral DP/PT.  Echo: December 2019: Left ventricle: Diffuse hypokinesis worse in the inferor wall.   The cavity size was moderately dilated. Wall thickness was   normal. Systolic function was moderately reduced. The estimated   ejection fraction was in the range of 35% to 40%. Left   ventricular diastolic function parameters were normal. - Mitral valve: Calcified annulus. Mildly thickened leaflets . - Atrial septum: No defect or patent foramen ovale was identified.  Cardiac cath 04/11/12: Left main: Ostial 30% stenosis with heavy calcification.  Left Anterior Descending Artery: Large caliber vessel that courses to the apex. The proximal vessel has 30%  stenosis. The mid vessel has a 60% stenosis at the takeoff of a septal perforating branch. Moderate caliber diagonal branch with mild plaque disease.  Circumflex Artery: Moderate caliber vessel. The proximal vessel has diffuse 30% stenosis. The first 2 obtuse marginal branches are very small. The third obtuse marginal branch is a small bifurcating vessel with diffuse 90% stenosis involving both branches. (1.75 mm vessel)  Right Coronary Artery: Large, dominant artery with 50% mid stenosis and 100% distal occlusion.  Left Ventricular Angiogram: LVEF=30-35% with akinesis of the inferior wall.  EKG:  EKG is not *** ordered today. The ekg ordered today demonstrates   Recent Labs: 04/02/2018: TSH 0.732 04/03/2018: ALT 12; BUN 19; Creatinine, Ser 1.10; Hemoglobin 10.8; Platelets 189; Potassium 4.5; Sodium 135   Lipid Panel    Component Value Date/Time   CHOL 161 04/02/2018 2306   TRIG 91 04/02/2018 2306   HDL 72 04/02/2018 2306   CHOLHDL 2.2 04/02/2018 2306   VLDL 18 04/02/2018 2306   LDLCALC 71 04/02/2018 2306     Wt Readings from Last 3 Encounters:  04/03/18 126 lb 9.6 oz (57.4 kg)  03/07/18 126 lb (57.2 kg)  10/30/17 129 lb (58.5 kg)     Other studies Reviewed: Additional studies/ records that were reviewed today include: . Review of the above records demonstrates:    Assessment and Plan:   1. CAD without angina: No chest pain. Mild LV dysfunction by echo December 2017, unchanged. Continue ASA. *** ?why he is off of statin and beta blocker.   2. Ischemic cardiomyopathy: LVEF 35-40% by echo December 2019. *** ? Beta blocker and ARB.   3. Tobacco abuse: Smoking cessation is recommended.   4. HTN: BP is well controlled.   5. Hyperlipidemia: Lipids followed in primary care. ***? Off of statin.   6. History of CVA: *** He is followed in Neurology.   Current medicines are reviewed at length with the patient today.  The patient does not have concerns regarding medicines.  The  following changes have been made:  no change  Labs/ tests ordered today include:  No orders of the defined types were placed in this encounter.   Disposition:   FU with me in 12 months  Signed, Verne Carrow, MD 06/21/2018 7:55 AM    [facility] [address] [place], [place]  [address] Phone: [phone]; Fax: [phone]

## 2018-06-22 ENCOUNTER — Encounter: Payer: Self-pay | Admitting: Cardiovascular Disease

## 2018-07-19 ENCOUNTER — Telehealth: Payer: Self-pay

## 2018-07-19 IMAGING — MR MR BRAIN WO CONTRAST
8 of 10 series · 38 of 48 positions shown · non-contrast
Comparison: 03/23/2016 CT of the head.

CLINICAL DATA: 65 y/o M; found unresponsive with episode of cardiac
arrest.

EXAM:
MRI HEAD WITHOUT CONTRAST
TECHNIQUE: Multiplanar, multiecho pulse sequences of the brain and surrounding
structures were obtained without intravenous contrast.

[Series 3: DWI · axial · 3.0mm · 0.94mm/px · z∈[-37,+94]mm · 9 of 100 slices shown (1 of 2)]
[im 1/100]
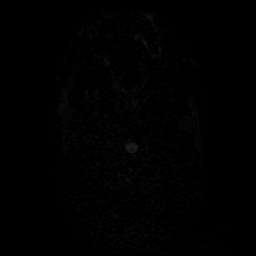
[im 19/100]
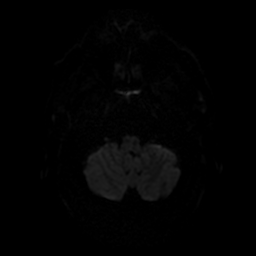
[im 28/100]
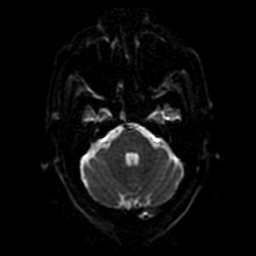
[im 46/100]
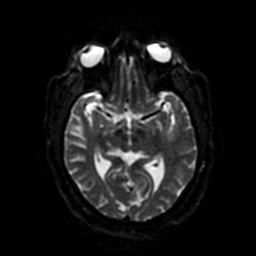
[im 55/100]
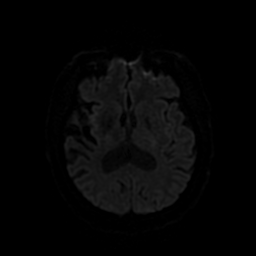
[im 73/100]
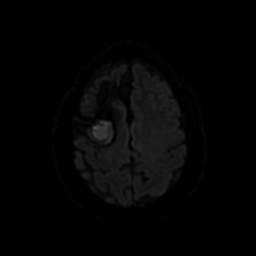
[im 82/100]
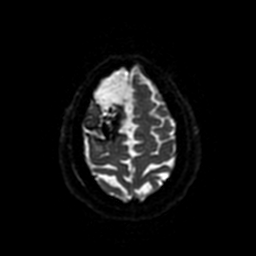
[im 91/100]
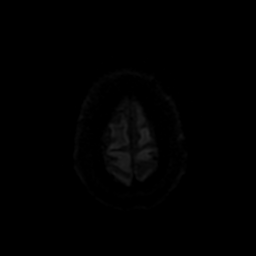
[im 100/100]
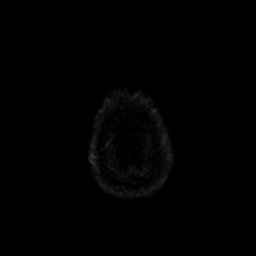

[Series 4: FLAIR · sagittal · 5.0mm · 0.47mm/px · 3 of 23 slices shown (1 of 2)]
[im 1/23]
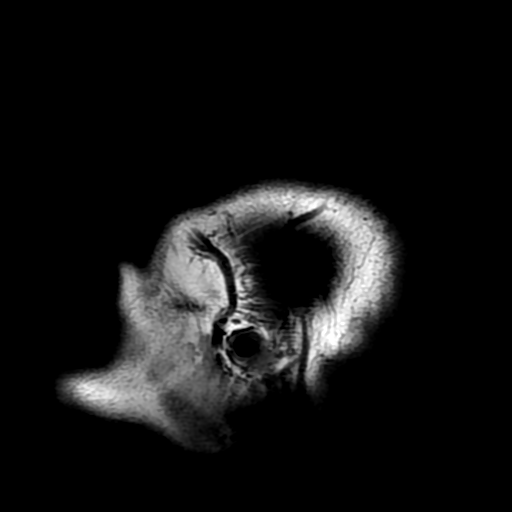
[im 12/23]
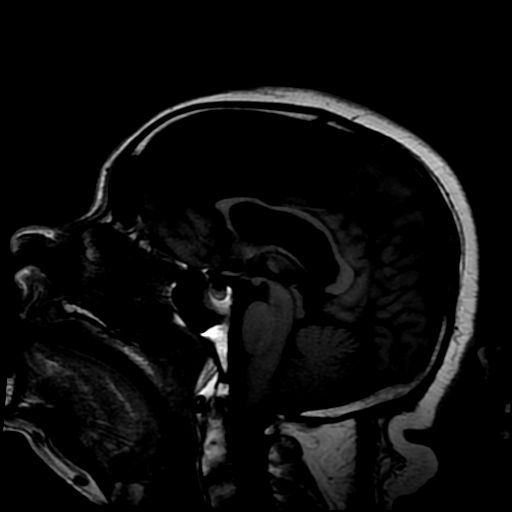
[im 23/23]
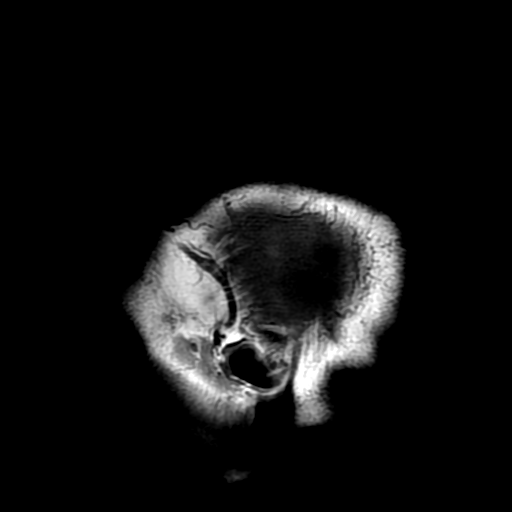

[Series 6: T2 · axial · 5.0mm · 0.43mm/px · z∈[-40,+88]mm · 3 of 25 slices shown (1 of 2)]
[im 1/25]
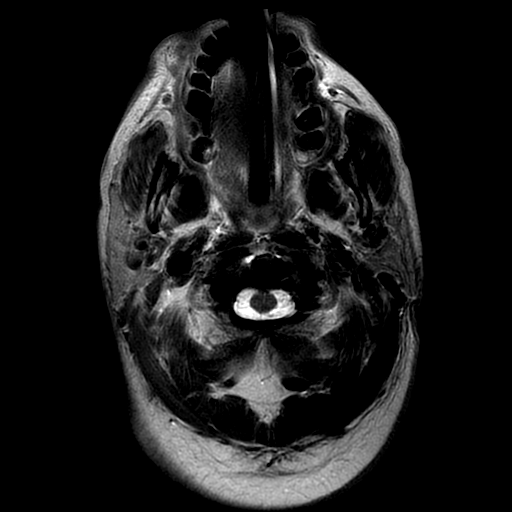
[im 13/25]
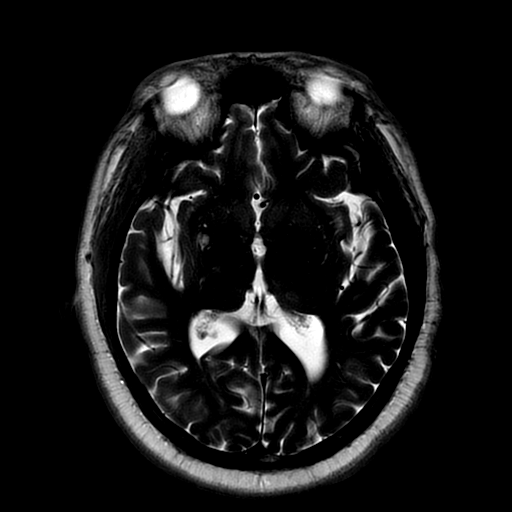
[im 25/25]
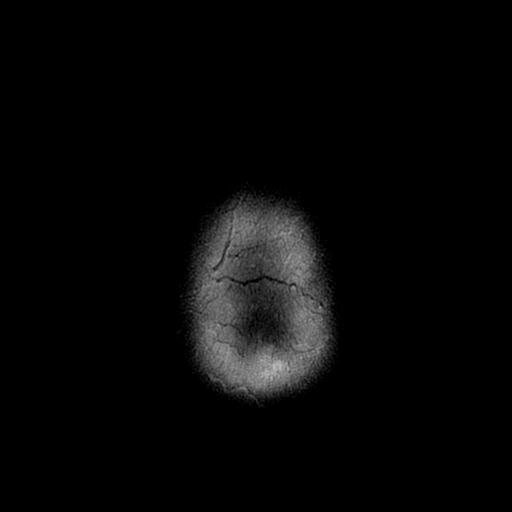

[Series 7: FLAIR · axial · 5.0mm · 0.43mm/px · z∈[-40,+88]mm · 3 of 25 slices shown (2 of 2)]
[im 1/25]
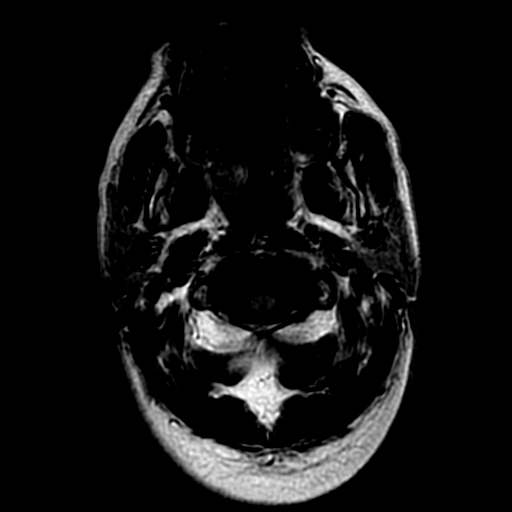
[im 13/25]
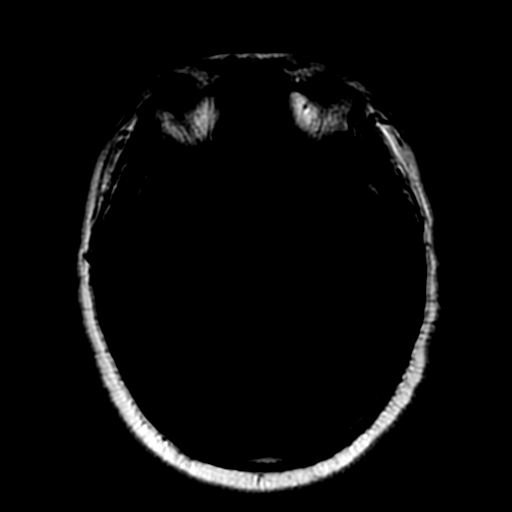
[im 25/25]
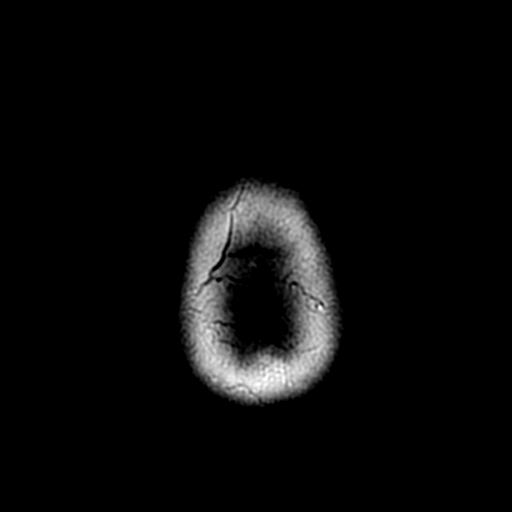

[Series 8: DWI · coronal · 4.0mm · 0.94mm/px · 8 of 67 slices shown (2 of 2)]
[im 1/67]
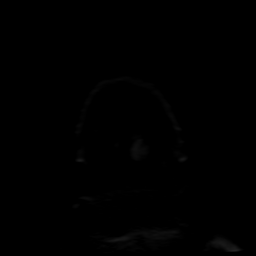
[im 10/67]
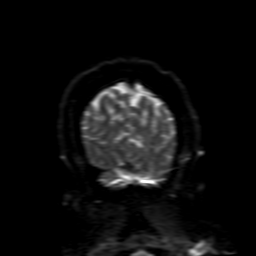
[im 19/67]
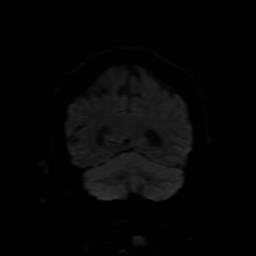
[im 29/67]
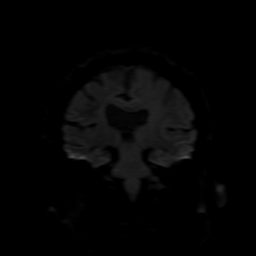
[im 38/67]
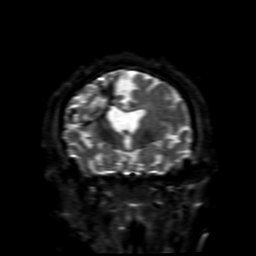
[im 48/67]
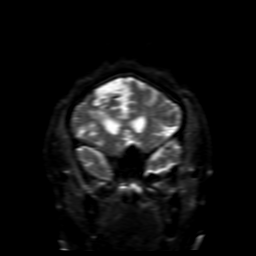
[im 57/67]
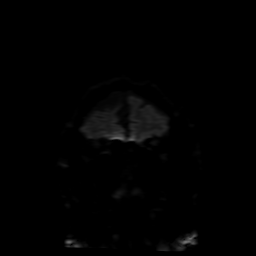
[im 67/67]
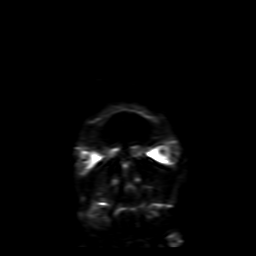

[Series 11: T2 · coronal · 5.0mm · 0.39mm/px · 2 of 28 slices shown (2 of 2)]
[im 1/28]
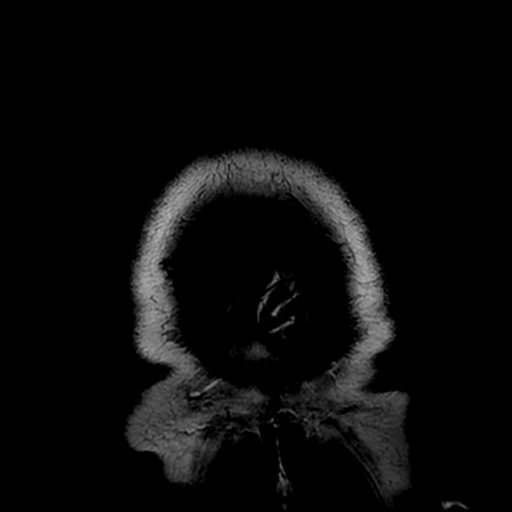
[im 14/28]
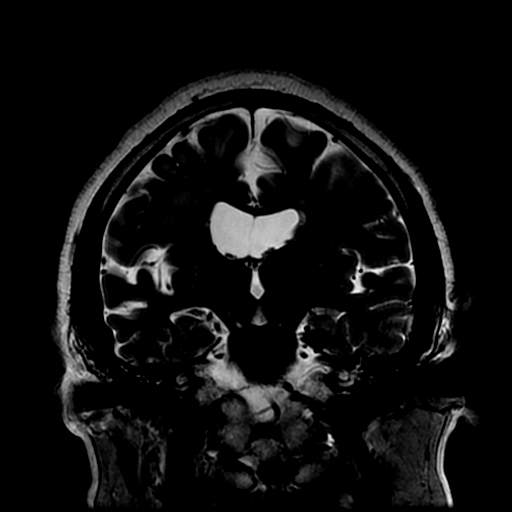

[Series 350: ADC · axial · 3.0mm · 0.94mm/px · z∈[-37,+94]mm · 6 of 50 slices shown (1 of 2)]
[im 1/50]
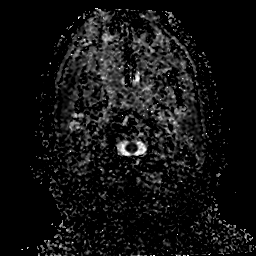
[im 10/50]
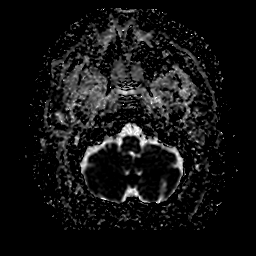
[im 20/50]
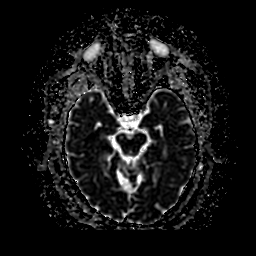
[im 30/50]
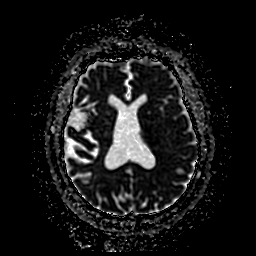
[im 40/50]
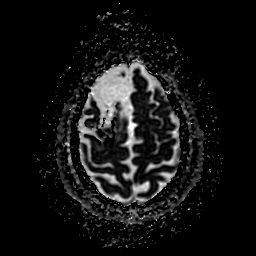
[im 50/50]
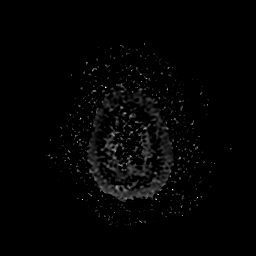

[Series 850: ADC · coronal · 4.0mm · 0.94mm/px · 4 of 34 slices shown (2 of 2)]
[im 1/34]
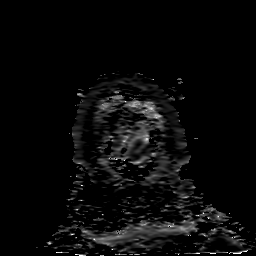
[im 12/34]
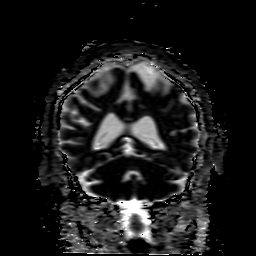
[im 23/34]
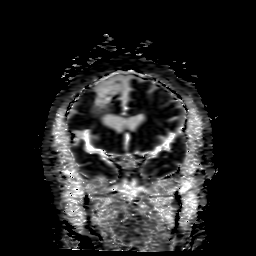
[im 34/34]
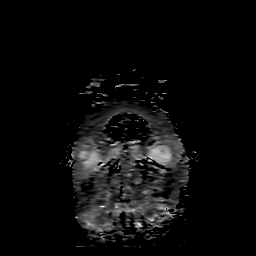

[38 of 48 positions shown; findings below may reference images not displayed]

FINDINGS: Brain: Hemosiderin stained encephalomalacia of the right superior
anterior frontal lobe and of the right lateral frontal lobe
compatible with chronic infarction. Hematoma centered in the right
posterior frontal lobe measuring 27 x 31 x 20 mm (AP x ML x CC) with
T1 and T2 hyperintensity compatible with subacute hematoma. No
diffusion hyperintensity to suggest acute/early subacute infarct. No
hydrocephalus. No extra-axial collection.

Vascular: Normal flow voids.

Skull and upper cervical spine: Normal marrow signal.

Sinuses/Orbits: Chronic left lamina papyracea fracture. Orbits are
unremarkable. Mild paranasal sinus mucosal thickening, fluid in
nasopharynx, and patchy opacification of ethmoid air cells, probably
due to intubation.

Other: None.
IMPRESSION: 1. Subacute hematoma in the right posterior frontal lobe measuring
up to 31 mm.
2. Chronic right anterior superior and right lateral frontal
infarcts.
3. No evidence for acute/early subacute infarct or significant mass
effect.

By: Chak Sum Ratna Sari M.D.

## 2018-07-19 NOTE — Telephone Encounter (Signed)
Called pt to set up possible evisit. No answer and unable to leave message.

## 2018-07-20 ENCOUNTER — Telehealth: Payer: Self-pay

## 2018-07-20 NOTE — Telephone Encounter (Signed)
See telephone note from 4/9. Patient does not have a phone. Patient's sister will have patient do a TELEPHONE Visit with Jacolyn Reedy, PA on 4/15 using her phone.

## 2018-07-20 NOTE — Telephone Encounter (Signed)
Called and spoke to patient's sister (DPR on file). She states that patient does not have a phone but she will have patient at her house to use her phone for a TELEPHONE Visit on 4/15 for his appointment.      Virtual Visit Pre-Appointment Phone Call  TELEPHONE CALL NOTE  Randall Patel has been deemed a candidate for a follow-up tele-health visit to limit community exposure during the Covid-19 pandemic. I spoke with the patient via phone to ensure availability of phone/video source, confirm preferred email & phone number, and discuss instructions and expectations.  I reminded Randall Patel to be prepared with any vital sign and/or heart rhythm information that could potentially be obtained via home monitoring, at the time of his visit. I reminded Randall Patel to expect a phone call at the time of his visit if his visit.  Did the patient verbally acknowledge consent to treatment? YES  Lattie Haw, RN 07/20/2018 2:46 PM    CONSENT FOR TELE-HEALTH VISIT - PLEASE REVIEW  I hereby voluntarily request, consent and authorize [facility] and its employed or contracted physicians, physician assistants, nurse practitioners or other licensed health care professionals (the Practitioner), to provide me with telemedicine health care services (the "Services") as deemed necessary by the treating Practitioner. I acknowledge and consent to receive the Services by the Practitioner via telemedicine. I understand that the telemedicine visit will involve communicating with the Practitioner through live audiovisual communication technology and the disclosure of certain medical information by electronic transmission. I acknowledge that I have been given the opportunity to request an in-person assessment or other available alternative prior to the telemedicine visit and am voluntarily participating in the telemedicine visit.  I understand that I have the right to withhold or withdraw my consent to the use  of telemedicine in the course of my care at any time, without affecting my right to future care or treatment, and that the Practitioner or I may terminate the telemedicine visit at any time. I understand that I have the right to inspect all information obtained and/or recorded in the course of the telemedicine visit and may receive copies of available information for a reasonable fee.  I understand that some of the potential risks of receiving the Services via telemedicine include:  [name] Delay or interruption in medical evaluation due to technological equipment failure or disruption; . Information transmitted may not be sufficient (e.g. poor resolution of images) to allow for appropriate medical decision making by the Practitioner; and/or  . In rare instances, security protocols could fail, causing a breach of personal health information.  Furthermore, I acknowledge that it is my responsibility to provide information about my medical history, conditions and care that is complete and accurate to the best of my ability. I acknowledge that Practitioner's advice, recommendations, and/or decision may be based on factors not within their control, such as incomplete or inaccurate data provided by me or distortions of diagnostic images or specimens that may result from electronic transmissions. I understand that the practice of medicine is not an exact science and that Practitioner makes no warranties or guarantees regarding treatment outcomes. I acknowledge that I will receive a copy of this consent concurrently upon execution via email to the email address I last provided but may also request a printed copy by calling the office of [facility].    I understand that my insurance will be billed for this visit.   I have read or had this consent read to me. [name]  I understand the contents of this consent, which adequately explains the benefits and risks of the Services being provided via telemedicine.  . I have been  provided ample opportunity to ask questions regarding this consent and the Services and have had my questions answered to my satisfaction. . I give my informed consent for the services to be provided through the use of telemedicine in my medical care  By participating in this telemedicine visit I agree to the above.

## 2018-07-20 NOTE — Telephone Encounter (Signed)
Called pt to set up possible evisit, unable to leave message. 

## 2018-07-21 IMAGING — CR DG CHEST PORT 1 VIEW
1 series · 1 of 1 positions shown · non-contrast
Comparison: March 25, 2016

CLINICAL DATA: Hypoxia

EXAM:
PORTABLE CHEST 1 VIEW

[AP]
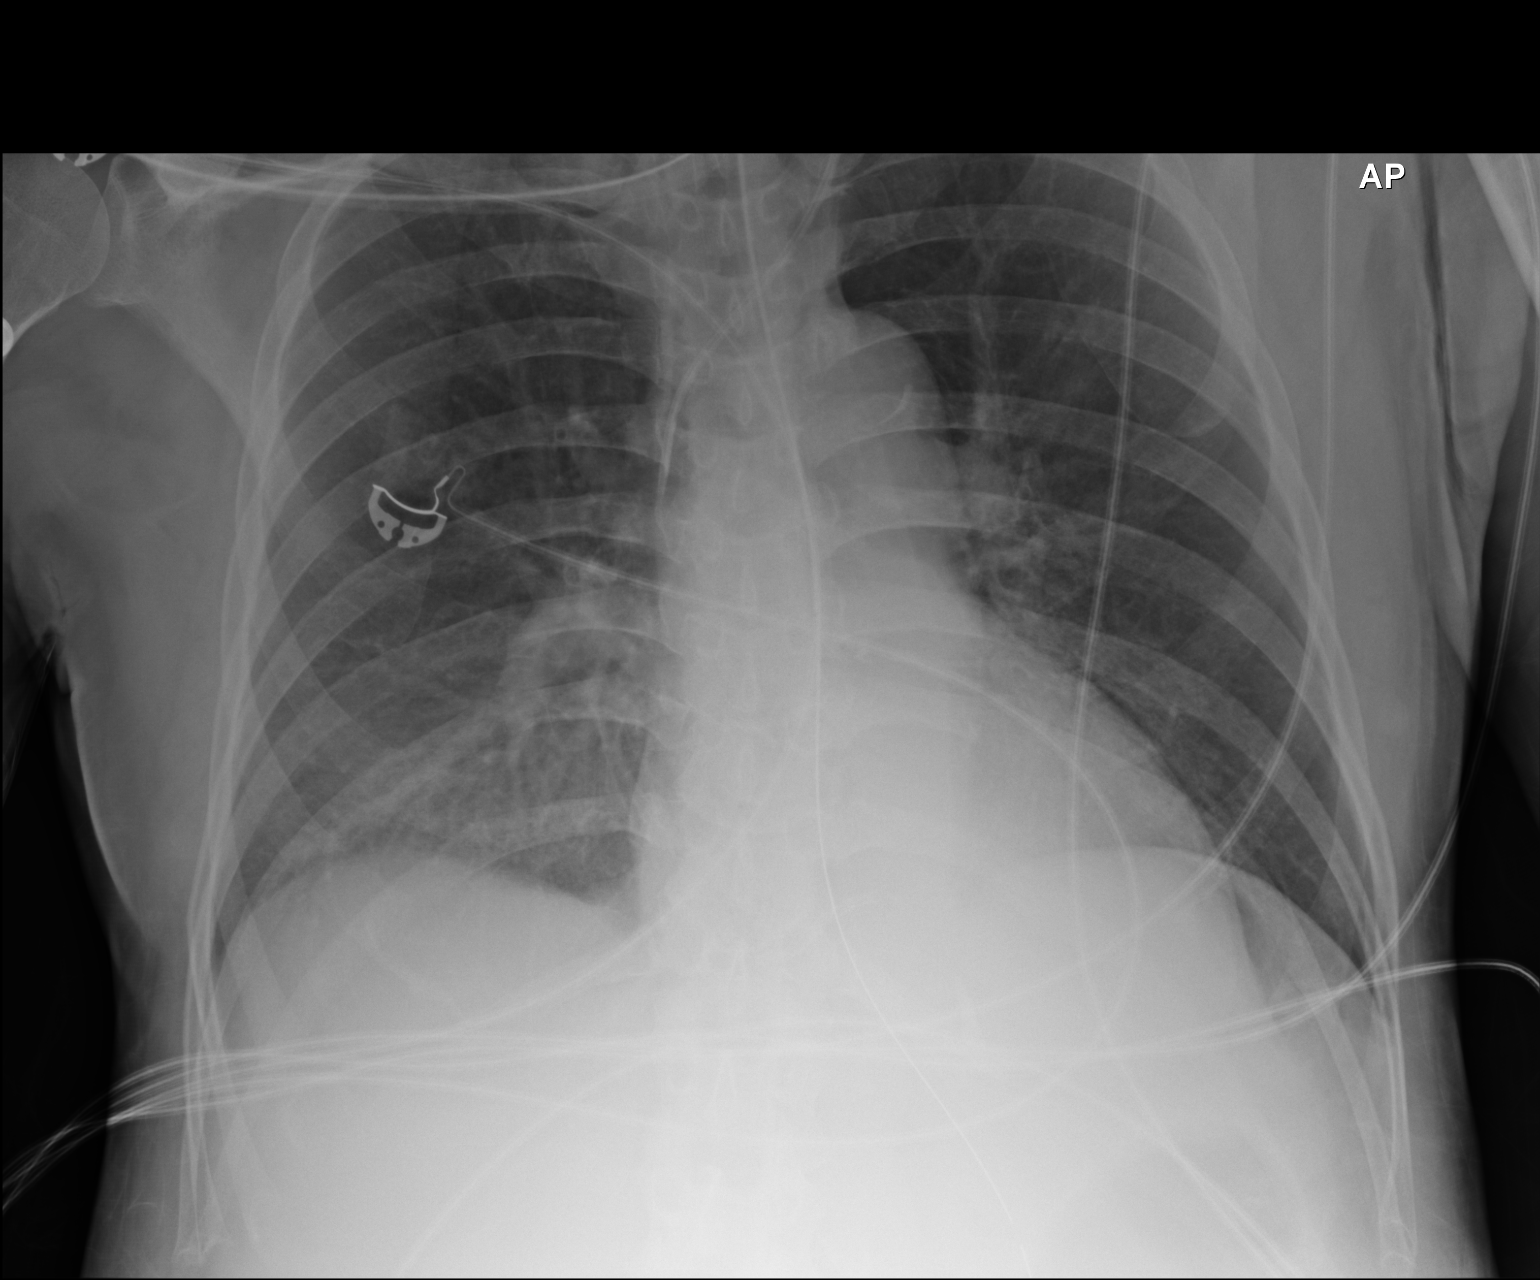

[1 of 1 positions shown; findings below may reference images not displayed]

FINDINGS: Endotracheal tube tip is 6.5 cm above the carina. Central catheter
tip is in the superior vena cava. Nasogastric tube tip and side port
are below the diaphragm. No pneumothorax. There is atelectatic
change in the right base region. Lungs elsewhere are clear. Heart
size and pulmonary vascularity are normal. No adenopathy. There is
atherosclerotic calcification in the aorta. No bone lesions.
IMPRESSION: Tube and catheter positions as described without pneumothorax. Right
base atelectasis. Lungs elsewhere clear. There is aortic
atherosclerosis.

## 2018-07-22 IMAGING — CR DG CHEST PORT 1 VIEW
1 series · 1 of 1 positions shown · non-contrast
Comparison: 03/26/2016

CLINICAL DATA: ET tube

EXAM:
PORTABLE CHEST 1 VIEW

[AP]
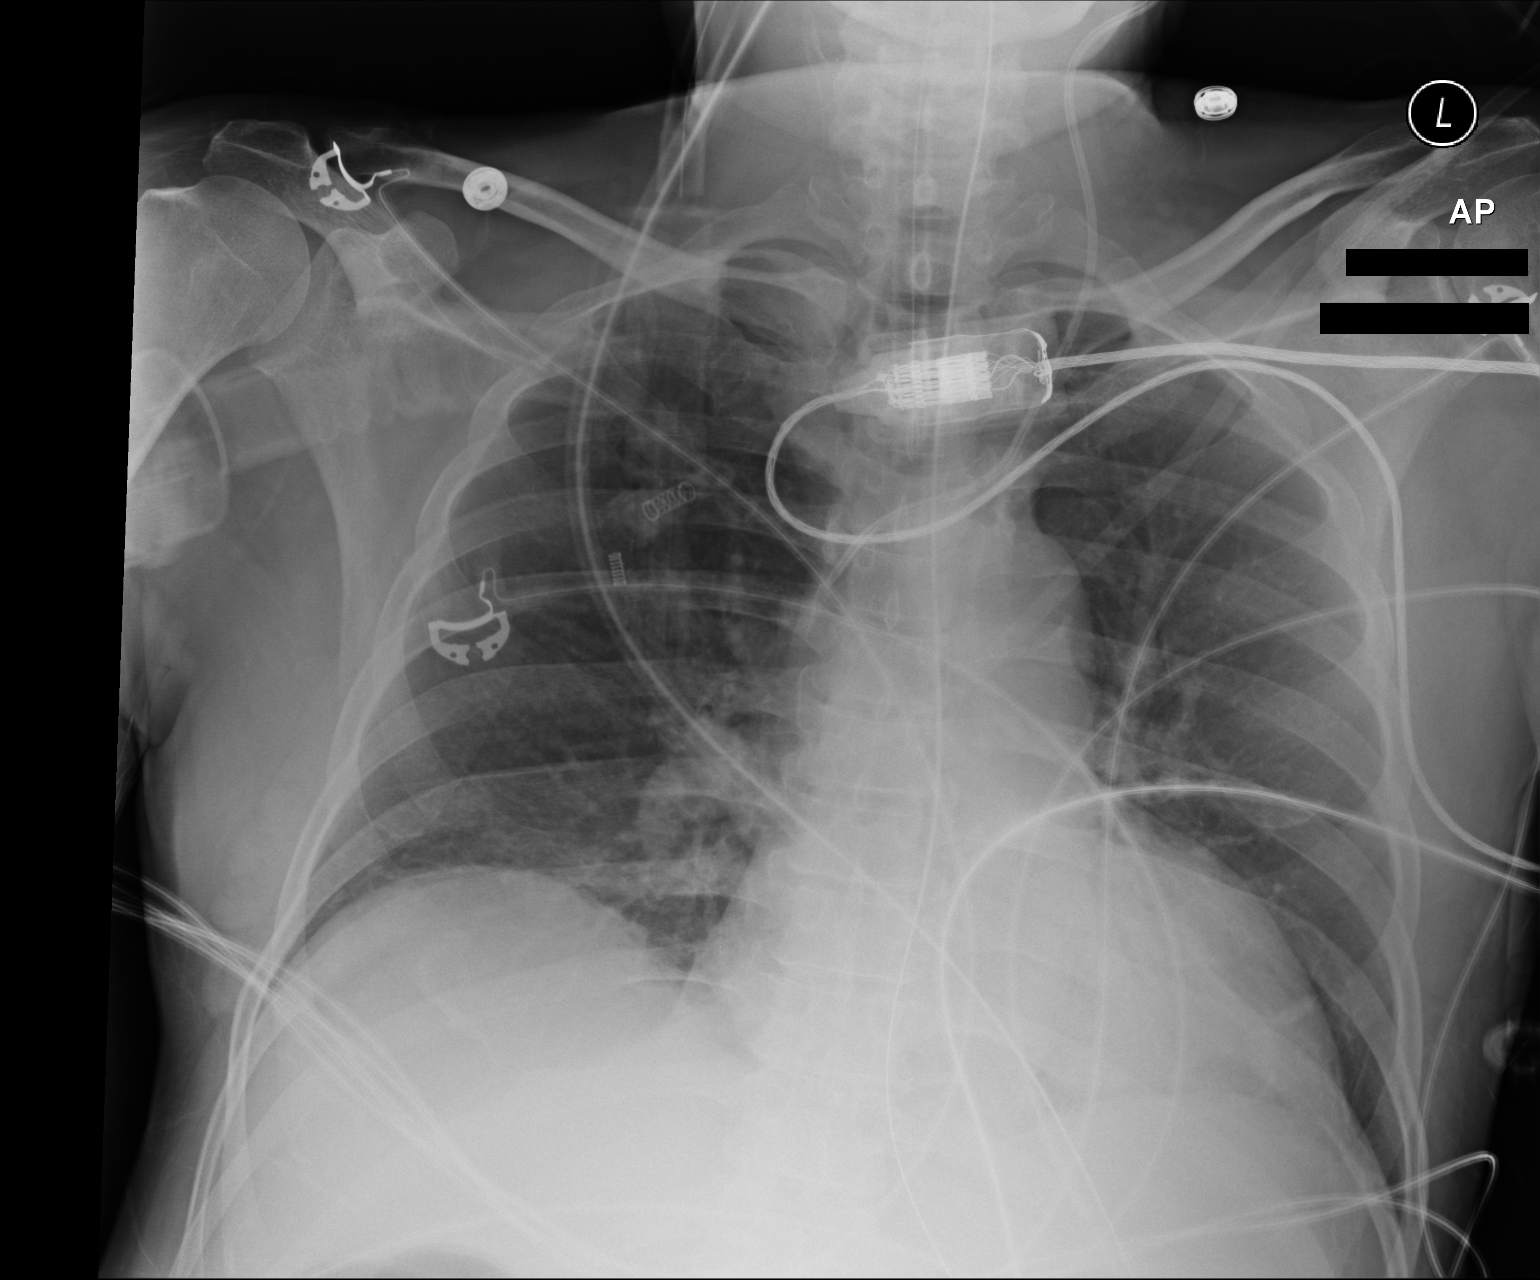

[1 of 1 positions shown; findings below may reference images not displayed]

FINDINGS: Support devices including endotracheal tube are stable. Mild
cardiomegaly. Bibasilar atelectasis. No overt edema or effusions.
IMPRESSION: Mild cardiomegaly and bibasilar atelectasis.

## 2018-07-26 ENCOUNTER — Ambulatory Visit (INDEPENDENT_AMBULATORY_CARE_PROVIDER_SITE_OTHER): Payer: Medicare HMO | Admitting: Physician Assistant

## 2018-07-26 ENCOUNTER — Encounter: Payer: Self-pay | Admitting: Physician Assistant

## 2018-07-26 VITALS — Ht 63.0 in | Wt 128.0 lb

## 2018-07-26 DIAGNOSIS — I255 Ischemic cardiomyopathy: Secondary | ICD-10-CM

## 2018-07-26 DIAGNOSIS — I63412 Cerebral infarction due to embolism of left middle cerebral artery: Secondary | ICD-10-CM

## 2018-07-26 DIAGNOSIS — I2510 Atherosclerotic heart disease of native coronary artery without angina pectoris: Secondary | ICD-10-CM | POA: Diagnosis not present

## 2018-07-26 DIAGNOSIS — I10 Essential (primary) hypertension: Secondary | ICD-10-CM | POA: Diagnosis not present

## 2018-07-26 DIAGNOSIS — E785 Hyperlipidemia, unspecified: Secondary | ICD-10-CM

## 2018-07-26 NOTE — Progress Notes (Signed)
Virtual Visit via Telephone Note   This visit type was conducted due to national recommendations for restrictions regarding the COVID-19 Pandemic (e.g. social distancing) in an effort to limit this patient's exposure and mitigate transmission in our community.  Due to his co-morbid illnesses, this patient is at least at moderate risk for complications without adequate follow up.  This format is felt to be most appropriate for this patient at this time.  The patient did not have access to video technology/had technical difficulties with video requiring transitioning to audio format only (telephone).  All issues noted in this document were discussed and addressed.  No physical exam could be performed with this format.  Please refer to the patient's chart for his  consent to telehealth for [facility].   Evaluation Performed:  Follow-up visit  Date:  07/26/2018   ID:  Randall Patel, DOB November 20, 1950, MRN [id number]  Patient Location: Home Provider Location: Home  PCP:  Grayce Sessions, NP  Cardiologist:  Verne Carrow, MD   Electrophysiologist:  None   Chief Complaint:  Hospital f/u  History of Present Illness:    Randall Patel is a 68 y.o. male with  with history of MI in [place] with PLA stent, DES to the RCA in 2013 and PTCA of the PLA branch, history of ischemic cardiomyopathy ejection fraction 40 to 45%, hypertension, HLD, prior intracerebral hemorrhage with residual stroke symptoms speech difficulty and mild right facial droop.  Patient was hospitalized 03/2018 for chest and epigastric pain unrelieved with nitroglycerin.  He was dehydrated and received IV fluids as well as Pepcid and Gaviscon which improved his symptoms.  Findings were negative and EKG unchanged.  He was hypotensive and had poor oral intake with weight loss.  Irbesartan and Spironolactone, carvedilol, amlodipine were held.  Patient worried about being off his meds but hasn't seen PCP Since Feb. No chest  pain, palpitations, dyspnea, dyspnea on exertion, edema. Was raking leaves, walks about 1000 feet without problem. If he walks any further his calves cramp up.     The patient does not have symptoms concerning for COVID-19 infection (fever, chills, cough, or new shortness of breath).    Past Medical History:  Diagnosis Date  . Anginal pain (HCC)   . Arthritis   . CAD (coronary artery disease)    a. MI in 2008 w PL stent b. MI 03/2012: 30% ostial LM stenosis, 30% pLAD stenosis, 60% mid-LADm 30% pCx, 3rd OM which was small in size with diffuse 90% stenosis involving both branches, and RCA with 50% mid-stenosis and 100% distal occlusion. POBA to PL branch and DES placement to dRCA.  [name] CHF (congestive heart failure) (HCC)   . Claudication (HCC)   . CVA (cerebral infarction)    03/2012 with left arm weakness/numbness and left facial droop.  [name] ETOH abuse    quit 04/06/12  . HTN (hypertension)   . Hyperlipidemia   . Ischemic cardiomyopathy    a. EF 35-40% by echo 2014 b.EF 40-45% by echo in 03/2015  . Myocardial infarction (HCC)   . Seizures (HCC)   . Stroke (HCC) 03/2015   R frontal hemorrhage  . Tobacco abuse    quit 04/06/12  . Withdrawal seizures [facility])    Past Surgical History:  Procedure Laterality Date  . CORONARY ANGIOPLASTY WITH STENT PLACEMENT  03/2012  . HERNIA REPAIR    . LEFT HEART CATHETERIZATION WITH CORONARY ANGIOGRAM N/A 04/11/2012   Procedure: LEFT HEART CATHETERIZATION WITH CORONARY ANGIOGRAM;  Surgeon:  Kathleene Hazel, MD;  Location: [facility] CATH LAB;  Service: Cardiovascular;  Laterality: N/A;  . PERCUTANEOUS CORONARY INTERVENTION-BALLOON ONLY  04/11/2012   Procedure: PERCUTANEOUS CORONARY INTERVENTION-BALLOON ONLY;  Surgeon: Kathleene Hazel, MD;  Location: [facility] CATH LAB;  Service: Cardiovascular;;  RPLA  . PERCUTANEOUS CORONARY STENT INTERVENTION (PCI-S)  04/11/2012   Procedure: PERCUTANEOUS CORONARY STENT INTERVENTION (PCI-S);  Surgeon: Kathleene Hazel, MD;  Location: [facility] CATH LAB;  Service: Cardiovascular;;  Distal RCA     Current Meds  Medication Sig  . aspirin EC 81 MG tablet Take 81 mg by mouth daily.  [name] atorvastatin (LIPITOR) 80 MG tablet   . calcium carbonate (TUMS EX) 750 MG chewable tablet Chew 1 tablet (750 mg total) by mouth 3 (three) times daily as needed for heartburn.  . docusate sodium (COLACE) 100 MG capsule Take 1 capsule (100 mg total) by mouth every 12 (twelve) hours.  . feeding supplement, ENSURE ENLIVE, (ENSURE ENLIVE) LIQD Take 237 mLs by mouth 2 (two) times daily between meals.  . folic acid (FOLVITE) 1 MG tablet Take 1 tablet (1 mg total) by mouth daily.  . Multiple Vitamin (MULTIVITAMIN WITH MINERALS) TABS tablet Take 1 tablet by mouth daily.  . pantoprazole (PROTONIX) 40 MG tablet Take 1 tablet (40 mg total) by mouth daily.  . polyethylene glycol powder (GLYCOLAX/MIRALAX) powder Take 17 g by mouth daily. Until daily soft stools  OTC  . thiamine 100 MG tablet Take 1 tablet (100 mg total) by mouth daily.     Allergies:   Lisinopril   Social History   Tobacco Use  . Smoking status: Current Every Day Smoker    Packs/day: 0.50    Years: 40.00    Pack years: 20.00    Types: Cigarettes  . Smokeless tobacco: Never Used  Substance Use Topics  . Alcohol use: Yes    Comment: "bourbon"  . Drug use: No    Types: Benzodiazepines, Marijuana     Family Hx: The patient's family history includes Cancer in his mother; Heart attack in his father; Hypertension in his brother, father, mother, and sister; Stroke in his sister.  ROS:   Please see the history of present illness.    Leg cramps with walking All other systems reviewed and are negative.   Prior CV studies:   The following studies were reviewed today:   2D echo 12/2019Study Conclusions   - Left ventricle: Diffuse hypokinesis worse in the inferor wall.   The cavity size was moderately dilated. Wall thickness was   normal. Systolic function was  moderately reduced. The estimated   ejection fraction was in the range of 35% to 40%. Left   ventricular diastolic function parameters were normal. - Mitral valve: Calcified annulus. Mildly thickened leaflets . - Atrial septum: No defect or patent foramen ovale was identified.   Labs/Other Tests and Data Reviewed:    EKG:  No ECG reviewed.  Recent Labs: 04/02/2018: TSH 0.732 04/03/2018: ALT 12; BUN 19; Creatinine, Ser 1.10; Hemoglobin 10.8; Platelets 189; Potassium 4.5; Sodium 135   Recent Lipid Panel Lab Results  Component Value Date/Time   CHOL 161 04/02/2018 11:06 PM   TRIG 91 04/02/2018 11:06 PM   HDL 72 04/02/2018 11:06 PM   CHOLHDL 2.2 04/02/2018 11:06 PM   LDLCALC 71 04/02/2018 11:06 PM    Wt Readings from Last 3 Encounters:  07/26/18 128 lb (58.1 kg)  04/03/18 126 lb 9.6 oz (57.4 kg)  03/07/18 126 lb (57.2 kg)  Objective:    Vital Signs:  Ht 5\' 3"  (1.6 m)   Wt 128 lb (58.1 kg)   BMI 22.67 kg/m       ASSESSMENT & PLAN:    1.  CAD with history of MI in 2008 [place] stenting of the PLA in [place], DES to the RCA in 2013, PTCA of the PLA branch, residual 90% circumflex OM3 disease treated medically-no angina.  Only on aspirin and Lipitor at this time.  He is worried about not being on any of his medications. 2. Ischemic cardiomyopathy ejection fraction 30 to 35% on echo 03/2018 EF is worse than prior echo in 2017.  Would like to have him back on ARB and carvedilol and Spironolactone.  Will arrange for home health to go assess the patient's blood pressure, vitals and heart failure.  Hopefully we can really start his medications gradually.  Currently without symptoms of heart failure.  We will also have them draw labs as he has not had any labs since December. 3. Epigastric pain with dehydration, AKI and hypotension 03/2018 treated with IV fluids and GI meds.  Irbesartan and spironolactone, amlodipine carvedilol were held 4. Essential hypertension 5. 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 Hyperlipidemia LDL 7112/22/19-atorvastatin was stopped      COVID-19 Education: The signs and symptoms of COVID-19 were discussed with the patient and how to seek care for testing (follow up with PCP or arrange E-visit).   The importance of social distancing was discussed today.  Time:   Today, I have spent 11 minutes with the patient with telehealth technology discussing the above problems.     Medication Adjustments/Labs and Tests Ordered: Current medicines are reviewed at length with the patient today.  Concerns regarding medicines are outlined above.   Tests Ordered: No orders of the defined types were placed in this encounter.   Medication Changes: No orders of the defined types were placed in this encounter.   Disposition:  Follow up in 2 week(s) with me  Signed, Jacolyn ReedyMichele Taleshia Luff, PA-C  07/26/2018 2:18 PM    [facility]

## 2018-07-26 NOTE — Patient Instructions (Signed)
Medication Instructions:  Your physician recommends that you continue on your current medications as directed. Please refer to the Current Medication list given to you today.  If you need a refill on your cardiac medications before your next appointment, please call your pharmacy.   Lab work: [facility] will come to your house to draw your labs (BMET, CBC)   If you have labs (blood work) drawn today and your tests are completely normal, you will receive your results only by: [name] MyChart Message (if you have MyChart) OR . A paper copy in the mail If you have any lab test that is abnormal or we need to change your treatment, we will call you to review the results.  Testing/Procedures: None ordered  Follow-Up: . Follow up with Jacolyn Reedy, PA via PHONE Visit on 08/09/18 at 1:00 PM  Any Other Special Instructions Will Be Listed Below (If Applicable).  [facility] will also check your Blood Pressure and Heart Rate and perform an assessment on you

## 2018-07-27 ENCOUNTER — Telehealth: Payer: Self-pay | Admitting: Physician Assistant

## 2018-07-27 NOTE — Telephone Encounter (Signed)
Follow Up:   She said she had received a fax,stating that you need to talk to her concerning, Randall Patel.

## 2018-07-27 NOTE — Telephone Encounter (Signed)
Okay thanks. Med list has been updated with irbesartan 150 mg QD and removed multivitamin.

## 2018-07-27 NOTE — Telephone Encounter (Signed)
I was contacted by [facility] health visited [name]. He actually has been taking irbesartan 150 mg daily, weight 126.6 lbs. No evidence of CHF. Couldn't get his blood, so another nurse will go out tomorrow. He's also not taking any vitamins. Can you update his med list? Thanks.

## 2018-08-02 MED ORDER — IRBESARTAN 150 MG PO TABS: 150.0000 mg | ORAL_TABLET | Freq: Every day | ORAL | 3 refills | Status: DC

## 2018-08-02 MED ORDER — ATORVASTATIN CALCIUM 80 MG PO TABS: 80.0000 mg | ORAL_TABLET | Freq: Every day | ORAL | 3 refills | Status: AC

## 2018-08-02 NOTE — Telephone Encounter (Signed)
Refills have been sent in for lipitor and irbesartan. Called and made Shiadda and the patient know that they have been sent [facility] on [address] [facility].

## 2018-08-02 NOTE — Telephone Encounter (Signed)
Spoke with [phone] with [place] home [facility]. Patient is stable, BP 130/80. Needs refills on Irbesartan 150 mg daily and mevacor. Thank you

## 2018-08-02 NOTE — Telephone Encounter (Signed)
Follow Up:     Returning your call, she is with the patient.

## 2018-08-04 ENCOUNTER — Telehealth: Payer: Self-pay

## 2018-08-04 NOTE — Telephone Encounter (Signed)
-----   Message from Dyann Kief, PA-C sent at 08/03/2018  4:35 PM EDT ----- Labs all stable. Continue current meds.

## 2018-08-04 NOTE — Telephone Encounter (Signed)
Called and made patient aware that the CBC and BMET that were drawn by [facility] on 07/28/18 were stable. Instructed patient to continue current medicines. Patient verbalized understanding and thanked me for the call.

## 2018-08-08 NOTE — Progress Notes (Signed)
Virtual Visit via Telephone Note   This visit type was conducted due to national recommendations for restrictions regarding the COVID-19 Pandemic (e.g. social distancing) in an effort to limit this patient's exposure and mitigate transmission in our community.  Due to his co-morbid illnesses, this patient is at least at moderate risk for complications without adequate follow up.  This format is felt to be most appropriate for this patient at this time.  The patient did not have access to video technology/had technical difficulties with video requiring transitioning to audio format only (telephone).  All issues noted in this document were discussed and addressed.  No physical exam could be performed with this format.  Please refer to the patient's chart for his  consent to telehealth for [facility].   Evaluation Performed:  Follow-up visit  Date:  08/09/2018   ID:  Randall Patel, DOB 10/11/1950, MRN [id number]  Patient Location: Home Provider Location: Home  PCP:  Grayce Sessions, NP  Cardiologist:  Verne Carrow, MD  Electrophysiologist:  None   Chief Complaint:  F/u  History of Present Illness:    Randall Patel is a 68 y.o. male with with history of MI in 72-[place] with PLA stent, DES to the RCA in 2013 and PTCA of the PLA branch, history of ischemic cardiomyopathy ejection fraction 40 to 45%, hypertension, HLD, prior intracerebral hemorrhage with residual stroke symptoms speech difficulty and mild right facial droop.   Patient was hospitalized 03/2018 for chest and epigastric pain unrelieved with nitroglycerin.  He was dehydrated and received IV fluids as well as Pepcid and Gaviscon which improved his symptoms.  Findings were negative and EKG unchanged.  He was hypotensive and had poor oral intake with weight loss.  Irbesartan and Spironolactone, carvedilol, amlodipine were held.Crt 1.09  I saw the patient 07/26/18 as televisit and was worried about being off his meds. We  had home health check on him and it turned out he was still on irbesartan but unable to get refills. I did refill this after documenting stable renal function. He had no symptoms of CHF.  Patient says he's doing pretty well. Patient can't read so sister is reading pills and now on hydralazine 50 mg TID and carvedilol 25 mg twice a day, spironolactone 25 mg daily in addition to Irbesartan.PCP called in  it into [facility].  Denies dizziness, edema, or chest pain.   The patient does not have symptoms concerning for COVID-19 infection (fever, chills, cough, or new shortness of breath).    Past Medical History:  Diagnosis Date  . Anginal pain (HCC)   . Arthritis   . CAD (coronary artery disease)    a. MI in 2008 w PL stent b. MI 03/2012: 30% ostial LM stenosis, 30% pLAD stenosis, 60% mid-LADm 30% pCx, 3rd OM which was small in size with diffuse 90% stenosis involving both branches, and RCA with 50% mid-stenosis and 100% distal occlusion. POBA to PL branch and DES placement to dRCA.  [name] CHF (congestive heart failure) (HCC)   . Claudication (HCC)   . CVA (cerebral infarction)    03/2012 with left arm weakness/numbness and left facial droop.  [name] ETOH abuse    quit 04/06/12  . HTN (hypertension)   . Hyperlipidemia   . Ischemic cardiomyopathy    a. EF 35-40% by echo 2014 b.EF 40-45% by echo in 03/2015  . Myocardial infarction (HCC)   . Seizures (HCC)   . Stroke (HCC) 03/2015   R frontal hemorrhage  .  Tobacco abuse    quit 04/06/12  . Withdrawal seizures [facility])    Past Surgical History:  Procedure Laterality Date  . CORONARY ANGIOPLASTY WITH STENT PLACEMENT  03/2012  . HERNIA REPAIR    . LEFT HEART CATHETERIZATION WITH CORONARY ANGIOGRAM N/A 04/11/2012   Procedure: LEFT HEART CATHETERIZATION WITH CORONARY ANGIOGRAM;  Surgeon: Kathleene Hazel, MD;  Location: [facility] CATH LAB;  Service: Cardiovascular;  Laterality: N/A;  . PERCUTANEOUS CORONARY INTERVENTION-BALLOON ONLY  04/11/2012    Procedure: PERCUTANEOUS CORONARY INTERVENTION-BALLOON ONLY;  Surgeon: Kathleene Hazel, MD;  Location: [facility] CATH LAB;  Service: Cardiovascular;;  RPLA  . PERCUTANEOUS CORONARY STENT INTERVENTION (PCI-S)  04/11/2012   Procedure: PERCUTANEOUS CORONARY STENT INTERVENTION (PCI-S);  Surgeon: Kathleene Hazel, MD;  Location: [facility] CATH LAB;  Service: Cardiovascular;;  Distal RCA     Current Meds  Medication Sig  . aspirin EC 81 MG tablet Take 81 mg by mouth daily.  [name] atorvastatin (LIPITOR) 80 MG tablet Take 1 tablet (80 mg total) by mouth daily at 6 PM.  . calcium carbonate (TUMS EX) 750 MG chewable tablet Chew 1 tablet (750 mg total) by mouth 3 (three) times daily as needed for heartburn.  . carvedilol (COREG) 25 MG tablet Take 1 tablet by mouth 2 (two) times a day.  . hydrALAZINE (APRESOLINE) 50 MG tablet Take 1 tablet by mouth 3 (three) times daily.  . irbesartan (AVAPRO) 150 MG tablet Take 1 tablet (150 mg total) by mouth daily.  . pantoprazole (PROTONIX) 40 MG tablet Take 1 tablet (40 mg total) by mouth daily.  [name] spironolactone (ALDACTONE) 25 MG tablet Take 1 tablet by mouth daily.  [name] thiamine 100 MG tablet Take 1 tablet (100 mg total) by mouth daily.  . [DISCONTINUED] folic acid (FOLVITE) 1 MG tablet Take 1 tablet (1 mg total) by mouth daily.     Allergies:   Lisinopril   Social History   Tobacco Use  . Smoking status: Current Every Day Smoker    Packs/day: 0.50    Years: 40.00    Pack years: 20.00    Types: Cigarettes  . Smokeless tobacco: Never Used  Substance Use Topics  . Alcohol use: Yes    Comment: "bourbon"  . Drug use: No    Types: Benzodiazepines, Marijuana     Family Hx: The patient's family history includes Cancer in his mother; Heart attack in his father; Hypertension in his brother, father, mother, and sister; Stroke in his sister.  ROS:   Please see the history of present illness.     All other systems reviewed and are negative.   Prior CV studies:    The following studies were reviewed today:   2D echo 03/2018 Study Conclusions   - Left ventricle: Diffuse hypokinesis worse in the inferor wall.   The cavity size was moderately dilated. Wall thickness was   normal. Systolic function was moderately reduced. The estimated   ejection fraction was in the range of 35% to 40%. Left   ventricular diastolic function parameters were normal. - Mitral valve: Calcified annulus. Mildly thickened leaflets . - Atrial septum: No defect or patent foramen ovale was identified.       Labs/Other Tests and Data Reviewed:    EKG:  No ECG reviewed.  Recent Labs: 04/02/2018: TSH 0.732 04/03/2018: ALT 12; BUN 19; Creatinine, Ser 1.10; Hemoglobin 10.8; Platelets 189; Potassium 4.5; Sodium 135   Recent Lipid Panel Lab Results  Component Value Date/Time   CHOL 161 04/02/2018 11:06  PM   TRIG 91 04/02/2018 11:06 PM   HDL 72 04/02/2018 11:06 PM   CHOLHDL 2.2 04/02/2018 11:06 PM   LDLCALC 71 04/02/2018 11:06 PM    Wt Readings from Last 3 Encounters:  08/09/18 128 lb (58.1 kg)  07/26/18 128 lb (58.1 kg)  04/03/18 126 lb 9.6 oz (57.4 kg)     Objective:    Vital Signs:  Ht  (1.6 m)   Wt 128 lb (58.1 kg)   BMI 22.67 kg/m      ASSESSMENT & PLAN:    1.  CAD with history of MI in 2008 with stenting of the PLA in [place], DES to the RCA in 2013, PTCA of the PLA branch, residual 90% circumflex OM3 disease treated medically-no angina.  Only on aspirin and Lipitor at this time.  2. Ischemic cardiomyopathy ejection fraction 30 to 35% on echo 03/2018 EF is worse than prior echo in 2017.  He is on irbesartan but BP runs low but up to 135/80 at nurse check. PCP called in carvedilol hydralazine and Spironolactone. Will need to recheck blood work. 3. Essential hypertension- recommend home health recheck. 4. 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 Hyperlipidemia LDL 71 04/02/18-atorvastatin was stopped-now back on    COVID-19  Education: The signs and symptoms of COVID-19 were discussed with the patient and how to seek care for testing (follow up with PCP or arrange E-visit).   The importance of social distancing was discussed today.  Time:   Today, I have spent  13 minutes with the patient with telehealth technology discussing the above problems.     Medication Adjustments/Labs and Tests Ordered: Current medicines are reviewed at length with the patient today.  Concerns regarding medicines are outlined above.   Tests Ordered: No orders of the defined types were placed in this encounter.   Medication Changes: No orders of the defined types were placed in this encounter.   Disposition:  Follow up in 1 month(s) with myself or Dr. Clifton James  Signed, Jacolyn Reedy, PA-C  08/09/2018 2:23 PM    [facility]

## 2018-08-09 ENCOUNTER — Telehealth: Payer: Self-pay | Admitting: Physician Assistant

## 2018-08-09 ENCOUNTER — Other Ambulatory Visit: Payer: Self-pay

## 2018-08-09 ENCOUNTER — Encounter: Payer: Self-pay | Admitting: Physician Assistant

## 2018-08-09 ENCOUNTER — Telehealth (INDEPENDENT_AMBULATORY_CARE_PROVIDER_SITE_OTHER): Payer: Medicare HMO | Admitting: Physician Assistant

## 2018-08-09 VITALS — Ht 63.0 in | Wt 128.0 lb

## 2018-08-09 DIAGNOSIS — I2510 Atherosclerotic heart disease of native coronary artery without angina pectoris: Secondary | ICD-10-CM

## 2018-08-09 DIAGNOSIS — E782 Mixed hyperlipidemia: Secondary | ICD-10-CM

## 2018-08-09 DIAGNOSIS — Z8673 Personal history of transient ischemic attack (TIA), and cerebral infarction without residual deficits: Secondary | ICD-10-CM

## 2018-08-09 DIAGNOSIS — I255 Ischemic cardiomyopathy: Secondary | ICD-10-CM

## 2018-08-09 DIAGNOSIS — I10 Essential (primary) hypertension: Secondary | ICD-10-CM

## 2018-08-09 NOTE — Patient Instructions (Addendum)
Medication Instructions:  Your physician recommends that you continue on your current medications as directed. Please refer to the Current Medication list given to you today.  If you need a refill on your cardiac medications before your next appointment, please call your pharmacy.   Lab work: We will arrange for Home Health to come out in 1-2 weeks to draw BMET and check you Blood Pressure  If you have labs (blood work) drawn today and your tests are completely normal, you will receive your results only by: [name] MyChart Message (if you have MyChart) OR . A paper copy in the mail If you have any lab test that is abnormal or we need to change your treatment, we will call you to review the results.  Testing/Procedures: None ordered  Follow-Up: . Follow up with Jacolyn Reedy, PA via TELEPHONE Visit on 09/12/18 at 1:30 PM  Any Other Special Instructions Will Be Listed Below (If Applicable).

## 2018-08-09 NOTE — Telephone Encounter (Signed)
Jacolyn Reedy, PA made aware.

## 2018-08-09 NOTE — Telephone Encounter (Signed)
New Message   Rob from [facility] is calling to inform that  The pt is declining any further visits due to the corona virus scare.

## 2018-08-10 ENCOUNTER — Telehealth: Payer: Self-pay | Admitting: Physician Assistant

## 2018-08-10 NOTE — Telephone Encounter (Signed)
New Message    Randall Patel is calling because she would like to extend the home health care visits    Please call

## 2018-08-10 NOTE — Telephone Encounter (Signed)
Called and let Randall Patel know that the patient will need to have his BP checked and BMET drawn in 1-2 weeks. She verbalized understanding and thanked me for the call.

## 2018-08-24 ENCOUNTER — Telehealth: Payer: Self-pay | Admitting: Physician Assistant

## 2018-08-24 NOTE — Telephone Encounter (Signed)
New Message:    Randall Patel from [facility], needs a verbal order for 2 more home visits by the nurse. Nurse needs to edcuate pt pt on heart failure If she is not there, please leave a voicemail.

## 2018-08-24 NOTE — Telephone Encounter (Signed)
Yes please extend home health!!! thanks

## 2018-08-24 NOTE — Telephone Encounter (Signed)
Will send to [facility] to for approval for extension of HH Visits for HF education.

## 2018-08-24 NOTE — Telephone Encounter (Signed)
Called and made Shraddha aware that Elon Jester was okay with her extending the visits.

## 2018-09-11 NOTE — Progress Notes (Signed)
Virtual Visit via Telephone Note   This visit type was conducted due to national recommendations for restrictions regarding the COVID-19 Pandemic (e.g. social distancing) in an effort to limit this patient's exposure and mitigate transmission in our community.  Due to his co-morbid illnesses, this patient is at least at moderate risk for complications without adequate follow up.  This format is felt to be most appropriate for this patient at this time.  The patient did not have access to video technology/had technical difficulties with video requiring transitioning to audio format only (telephone).  All issues noted in this document were discussed and addressed.  No physical exam could be performed with this format.  Please refer to the patient's chart for his  consent to telehealth [facility].   Date:  09/12/2018   ID:  Randall MallingBernard Moes, DOB November 28, 1950, MRN [id number]  Patient Location: Home Provider Location: Home  PCP:  Grayce SessionsEdwards, Michelle P, NP  Cardiologist:  Verne Carrowhristopher McAlhany, MD   Electrophysiologist:  None   Evaluation Performed:  Follow-Up Visit  Chief Complaint: F/u BP  History of Present Illness:    Randall MallingBernard Patel is a 68 y.o. male with history of MI in [address] with PLA stent, DES to the RCA in 2013 and PTCA of the PLA branch, history of ischemic cardiomyopathy ejection fraction 40 to 45%, hypertension, HLD, prior intracerebral hemorrhage with residual stroke symptoms speech difficulty and mild right facial droop.   Patient was hospitalized 03/2018 for chest and epigastric pain unrelieved with nitroglycerin.  He was dehydrated and received IV fluids as well as Pepcid and Gaviscon which improved his symptoms.  Findings were negative and EKG unchanged.  He was hypotensive and had poor oral intake with weight loss.  Irbesartan and Spironolactonte, carvedilol, amlodipine were held.Crt 1.09.  I have had several telemedicine visits with the patient and he had been off most of  his meds.  He is illiterate and his sister has to read the pills for him.  Follow-up labs 08/17/2018 were stable with normal renal function.  Patient denies chest pain, dyspnea, edema, dizziness or presyncope. Doing exercises exercises twice a day to help his balance. BP was good when checked by home health.      The patient does not have symptoms concerning for COVID-19 infection (fever, chills, cough, or new shortness of breath).    Past Medical History:  Diagnosis Date  . Anginal pain (HCC)   . Arthritis   . CAD (coronary artery disease)    a. MI in 2008 w PL stent b. MI 03/2012: 30% ostial LM stenosis, 30% pLAD stenosis, 60% mid-LADm 30% pCx, 3rd OM which was small in size with diffuse 90% stenosis involving both branches, and RCA with 50% mid-stenosis and 100% distal occlusion. POBA to PL branch and DES placement to dRCA.  [name]. CHF (congestive heart failure) (HCC)   . Claudication (HCC)   . CVA (cerebral infarction)    03/2012 with left arm weakness/numbness and left facial droop.  [name]. ETOH abuse    quit 04/06/12  . HTN (hypertension)   . Hyperlipidemia   . Ischemic cardiomyopathy    a. EF 35-40% by echo 2014 b.EF 40-45% by echo in 03/2015  . Myocardial infarction (HCC)   . Seizures (HCC)   . Stroke (HCC) 03/2015   R frontal hemorrhage  . Tobacco abuse    quit 04/06/12  . Withdrawal seizures [facility](HCC)    Past Surgical History:  Procedure Laterality Date  . CORONARY ANGIOPLASTY WITH STENT  PLACEMENT  03/2012  . HERNIA REPAIR    . LEFT HEART CATHETERIZATION WITH CORONARY ANGIOGRAM N/A 04/11/2012   Procedure: LEFT HEART CATHETERIZATION WITH CORONARY ANGIOGRAM;  Surgeon: Kathleene Hazel, MD;  Location: [facility] CATH LAB;  Service: Cardiovascular;  Laterality: N/A;  . PERCUTANEOUS CORONARY INTERVENTION-BALLOON ONLY  04/11/2012   Procedure: PERCUTANEOUS CORONARY INTERVENTION-BALLOON ONLY;  Surgeon: Kathleene Hazel, MD;  Location: [facility] CATH LAB;  Service: Cardiovascular;;  RPLA  .  PERCUTANEOUS CORONARY STENT INTERVENTION (PCI-S)  04/11/2012   Procedure: PERCUTANEOUS CORONARY STENT INTERVENTION (PCI-S);  Surgeon: Kathleene Hazel, MD;  Location: [facility] CATH LAB;  Service: Cardiovascular;;  Distal RCA     Current Meds  Medication Sig  . aspirin EC 81 MG tablet Take 81 mg by mouth daily.  [name] atorvastatin (LIPITOR) 80 MG tablet Take 1 tablet (80 mg total) by mouth daily at 6 PM.  . calcium carbonate (TUMS EX) 750 MG chewable tablet Chew 1 tablet (750 mg total) by mouth 3 (three) times daily as needed for heartburn.  . carvedilol (COREG) 25 MG tablet Take 1 tablet by mouth 2 (two) times a day.  . hydrALAZINE (APRESOLINE) 50 MG tablet Take 1 tablet by mouth 3 (three) times daily.  . irbesartan (AVAPRO) 150 MG tablet Take 1 tablet (150 mg total) by mouth daily.  . pantoprazole (PROTONIX) 40 MG tablet Take 1 tablet (40 mg total) by mouth daily.  [name] spironolactone (ALDACTONE) 25 MG tablet Take 1 tablet (25 mg total) by mouth daily.  [name] thiamine 100 MG tablet Take 1 tablet (100 mg total) by mouth daily.  . [DISCONTINUED] spironolactone (ALDACTONE) 25 MG tablet Take 1 tablet by mouth daily.     Allergies:   Lisinopril   Social History   Tobacco Use  . Smoking status: Current Every Day Smoker    Packs/day: 0.50    Years: 40.00    Pack years: 20.00    Types: Cigarettes  . Smokeless tobacco: Never Used  Substance Use Topics  . Alcohol use: Yes    Comment: "bourbon"  . Drug use: No    Types: Benzodiazepines, Marijuana     Family Hx: The patient's family history includes Cancer in his mother; Heart attack in his father; Hypertension in his brother, father, mother, and sister; Stroke in his sister.  ROS:   Please see the history of present illness.      All other systems reviewed and are negative.   Prior CV studies:   The following studies were reviewed today:   2D echo 12/2019Study Conclusions   - Left ventricle: Diffuse hypokinesis worse in the inferor  wall.   The cavity size was moderately dilated. Wall thickness was   normal. Systolic function was moderately reduced. The estimated   ejection fraction was in the range of 35% to 40%. Left   ventricular diastolic function parameters were normal. - Mitral valve: Calcified annulus. Mildly thickened leaflets . - Atrial septum: No defect or patent foramen ovale was identified.      Labs/Other Tests and Data Reviewed:    EKG:  No ECG reviewed.  Recent Labs: 04/02/2018: TSH 0.732 04/03/2018: ALT 12; BUN 19; Creatinine, Ser 1.10; Hemoglobin 10.8; Platelets 189; Potassium 4.5; Sodium 135   Recent Lipid Panel Lab Results  Component Value Date/Time   CHOL 161 04/02/2018 11:06 PM   TRIG 91 04/02/2018 11:06 PM   HDL 72 04/02/2018 11:06 PM   CHOLHDL 2.2 04/02/2018 11:06 PM   LDLCALC 71 04/02/2018 11:06 PM  Wt Readings from Last 3 Encounters:  09/12/18 129 lb (58.5 kg)  08/09/18 128 lb (58.1 kg)  07/26/18 128 lb (58.1 kg)     Objective:    Vital Signs:  Ht 5\' 3"  (1.6 m)   Wt 129 lb (58.5 kg)   BMI 22.85 kg/m  BP checked by home health last week and was doing well.  VITAL SIGNS:  reviewed  ASSESSMENT & PLAN:    1. CAD status post MI in 2008 treated with stenting to the PLA in [place], DES to the RCA 2013, PTCA of the PLA branch with residual 90% circumflex OM 3 disease treated medically.  On aspirin and Lipitor. No angina 2. Essential hypertension. Patient says BP has been stable 3. Hyperlipidemia on atorvastatin last LDL 71 03/2018 4. 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 Ischemic cardiomyopathy ejection fraction 35 to 40%-no CHF symptoms. F/u with Dr. Clifton James in 6 months  COVID-19 Education: The signs and symptoms of COVID-19 were discussed with the patient and how to seek care for testing (follow up with PCP or arrange E-visit).  The importance of social distancing was discussed today.  Time:   Today, I have spent 6 minutes with the patient  with telehealth technology discussing the above problems.  5 min prepping chart   Medication Adjustments/Labs and Tests Ordered: Current medicines are reviewed at length with the patient today.  Concerns regarding medicines are outlined above.   Tests Ordered: No orders of the defined types were placed in this encounter.   Medication Changes: Meds ordered this encounter  Medications  . spironolactone (ALDACTONE) 25 MG tablet    Sig: Take 1 tablet (25 mg total) by mouth daily.    Dispense:  90 tablet    Refill:  3    Disposition:  Follow up in 6 month(s) Dr. Clifton James  Signed, Jacolyn Reedy, PA-C  09/12/2018 1:46 PM    [facility]

## 2018-09-12 ENCOUNTER — Other Ambulatory Visit: Payer: Self-pay

## 2018-09-12 ENCOUNTER — Telehealth (INDEPENDENT_AMBULATORY_CARE_PROVIDER_SITE_OTHER): Payer: Medicare HMO | Admitting: Physician Assistant

## 2018-09-12 ENCOUNTER — Encounter: Payer: Self-pay | Admitting: Physician Assistant

## 2018-09-12 VITALS — Ht 63.0 in | Wt 129.0 lb

## 2018-09-12 DIAGNOSIS — I2510 Atherosclerotic heart disease of native coronary artery without angina pectoris: Secondary | ICD-10-CM

## 2018-09-12 DIAGNOSIS — Z8673 Personal history of transient ischemic attack (TIA), and cerebral infarction without residual deficits: Secondary | ICD-10-CM

## 2018-09-12 DIAGNOSIS — I10 Essential (primary) hypertension: Secondary | ICD-10-CM

## 2018-09-12 DIAGNOSIS — I255 Ischemic cardiomyopathy: Secondary | ICD-10-CM

## 2018-09-12 DIAGNOSIS — E785 Hyperlipidemia, unspecified: Secondary | ICD-10-CM

## 2018-09-12 MED ORDER — SPIRONOLACTONE 25 MG PO TABS: 25.0000 mg | ORAL_TABLET | Freq: Every day | ORAL | 3 refills | Status: DC

## 2018-09-12 NOTE — Patient Instructions (Signed)
Medication Instructions:  Your physician recommends that you continue on your current medications as directed. Please refer to the Current Medication list given to you today.  If you need a refill on your cardiac medications before your next appointment, please call your pharmacy.   Lab work: None Ordered  If you have labs (blood work) drawn today and your tests are completely normal, you will receive your results only by: . MyChart Message (if you have MyChart) OR . A paper copy in the mail If you have any lab test that is abnormal or we need to change your treatment, we will call you to review the results.  Testing/Procedures: None ordered  Follow-Up: At [facility], you and your health needs are our priority.  As part of our continuing mission to provide you with exceptional heart care, we have created designated Provider Care Teams.  These Care Teams include your primary Cardiologist (physician) and Advanced Practice Providers (APPs -  Physician Assistants and Nurse Practitioners) who all work together to provide you with the care you need, when you need it. . You will need a follow up appointment in 6 months.  Please call our office 2 months in advance to schedule this appointment.  You may see Chris McAlhany, MD or one of the following Advanced Practice Providers on your designated Care Team:   . Brittainy Simmons, PA-C . Dayna Dunn, PA-C . Michele Lenze, PA-C  Any Other Special Instructions Will Be Listed Below (If Applicable).    

## 2019-02-06 ENCOUNTER — Other Ambulatory Visit: Payer: Self-pay

## 2019-02-06 ENCOUNTER — Emergency Department (HOSPITAL_COMMUNITY): Payer: Medicare HMO

## 2019-02-06 ENCOUNTER — Emergency Department (HOSPITAL_COMMUNITY)
Admission: EM | Admit: 2019-02-06 | Discharge: 2019-02-06 | Disposition: A | Discharge status: Home / Self Care | Payer: Medicare HMO | Attending: Emergency Medicine | Admitting: Emergency Medicine

## 2019-02-06 DIAGNOSIS — Y9389 Activity, other specified: Secondary | ICD-10-CM | POA: Diagnosis not present

## 2019-02-06 DIAGNOSIS — Z79899 Other long term (current) drug therapy: Secondary | ICD-10-CM | POA: Diagnosis not present

## 2019-02-06 DIAGNOSIS — I252 Old myocardial infarction: Secondary | ICD-10-CM | POA: Insufficient documentation

## 2019-02-06 DIAGNOSIS — I69354 Hemiplegia and hemiparesis following cerebral infarction affecting left non-dominant side: Secondary | ICD-10-CM | POA: Insufficient documentation

## 2019-02-06 DIAGNOSIS — I2510 Atherosclerotic heart disease of native coronary artery without angina pectoris: Secondary | ICD-10-CM | POA: Insufficient documentation

## 2019-02-06 DIAGNOSIS — Y92410 Unspecified street and highway as the place of occurrence of the external cause: Secondary | ICD-10-CM | POA: Insufficient documentation

## 2019-02-06 DIAGNOSIS — I10 Essential (primary) hypertension: Secondary | ICD-10-CM | POA: Diagnosis not present

## 2019-02-06 DIAGNOSIS — S62326A Displaced fracture of shaft of fifth metacarpal bone, right hand, initial encounter for closed fracture: Secondary | ICD-10-CM | POA: Insufficient documentation

## 2019-02-06 DIAGNOSIS — S6991XA Unspecified injury of right wrist, hand and finger(s), initial encounter: Secondary | ICD-10-CM | POA: Diagnosis present

## 2019-02-06 DIAGNOSIS — Z7982 Long term (current) use of aspirin: Secondary | ICD-10-CM | POA: Diagnosis not present

## 2019-02-06 DIAGNOSIS — Y999 Unspecified external cause status: Secondary | ICD-10-CM | POA: Insufficient documentation

## 2019-02-06 DIAGNOSIS — F17210 Nicotine dependence, cigarettes, uncomplicated: Secondary | ICD-10-CM | POA: Diagnosis not present

## 2019-02-06 MED ORDER — OXYCODONE-ACETAMINOPHEN 5-325 MG PO TABS
1.0000 | ORAL_TABLET | ORAL | Status: DC | PRN
  Administered 2019-02-06: 1 via ORAL
  Filled 2019-02-06: qty 1

## 2019-02-06 MED ORDER — OXYCODONE-ACETAMINOPHEN 5-325 MG PO TABS
1.0000 | ORAL_TABLET | Freq: Once | ORAL | Status: AC
  Administered 2019-02-06: 22:00:00 1 via ORAL
  Filled 2019-02-06: qty 1

## 2019-02-06 MED ORDER — HYDROCODONE-ACETAMINOPHEN 5-325 MG PO TABS: 1.0000 | ORAL_TABLET | Freq: Four times a day (QID) | ORAL | 0 refills | Status: DC | PRN

## 2019-02-06 NOTE — Progress Notes (Signed)
Orthopedic Tech Progress Note Patient Details:  Randall Patel [birth year] [id number]  Ortho Devices Type of Ortho Device: Arm sling Ortho Device/Splint Location: RUE Ortho Device/Splint Interventions: Ordered, Application   Post Interventions Patient Tolerated: Well Instructions Provided: Care of device   Braulio Bosch 02/06/2019, 9:52 PM

## 2019-02-06 NOTE — ED Notes (Signed)
Patient verbalizes understanding of discharge instructions. Opportunity for questioning and answers were provided. Armband removed by staff, pt discharged from ED.  

## 2019-02-06 NOTE — Discharge Instructions (Signed)
You can take 1000 mg of Tylenol.  Do not exceed 4000 mg of Tylenol a day.  Take pain medications as directed for break through pain. Do not drive or operate machinery while taking this medication.   Please follow-up with referred outpatient orthopedic doctor.  Call his office and arrange for an appointment.  Do not get your splint wet.  At home, make sure you are elevating arm to help with swelling.  Return the emergency department for any worsening pain, numbness/weakness fingers, discoloration of hand or any other worsening or concerning symptoms.
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
## 2019-02-09 ENCOUNTER — Emergency Department (HOSPITAL_COMMUNITY)
Admission: EM | Admit: 2019-02-09 | Discharge: 2019-02-09 | Disposition: A | Discharge status: Home / Self Care | Payer: Medicare HMO | Attending: Emergency Medicine | Admitting: Emergency Medicine

## 2019-02-09 ENCOUNTER — Other Ambulatory Visit: Payer: Self-pay

## 2019-02-09 ENCOUNTER — Encounter (HOSPITAL_COMMUNITY): Payer: Self-pay | Admitting: Emergency Medicine

## 2019-02-09 DIAGNOSIS — Z7982 Long term (current) use of aspirin: Secondary | ICD-10-CM | POA: Diagnosis not present

## 2019-02-09 DIAGNOSIS — J449 Chronic obstructive pulmonary disease, unspecified: Secondary | ICD-10-CM | POA: Diagnosis not present

## 2019-02-09 DIAGNOSIS — I2510 Atherosclerotic heart disease of native coronary artery without angina pectoris: Secondary | ICD-10-CM | POA: Insufficient documentation

## 2019-02-09 DIAGNOSIS — I509 Heart failure, unspecified: Secondary | ICD-10-CM | POA: Insufficient documentation

## 2019-02-09 DIAGNOSIS — S62306D Unspecified fracture of fifth metacarpal bone, right hand, subsequent encounter for fracture with routine healing: Secondary | ICD-10-CM | POA: Insufficient documentation

## 2019-02-09 DIAGNOSIS — I252 Old myocardial infarction: Secondary | ICD-10-CM | POA: Diagnosis not present

## 2019-02-09 DIAGNOSIS — Z79899 Other long term (current) drug therapy: Secondary | ICD-10-CM | POA: Diagnosis not present

## 2019-02-09 DIAGNOSIS — I110 Hypertensive heart disease with heart failure: Secondary | ICD-10-CM | POA: Diagnosis not present

## 2019-02-09 DIAGNOSIS — F17210 Nicotine dependence, cigarettes, uncomplicated: Secondary | ICD-10-CM | POA: Diagnosis not present

## 2019-02-09 DIAGNOSIS — X58XXXD Exposure to other specified factors, subsequent encounter: Secondary | ICD-10-CM | POA: Insufficient documentation

## 2019-02-09 DIAGNOSIS — Z8673 Personal history of transient ischemic attack (TIA), and cerebral infarction without residual deficits: Secondary | ICD-10-CM | POA: Insufficient documentation

## 2019-02-09 MED ORDER — HYDROCODONE-ACETAMINOPHEN 5-325 MG PO TABS
1.0000 | ORAL_TABLET | Freq: Once | ORAL | Status: AC
  Administered 2019-02-09: 11:00:00 1 via ORAL
  Filled 2019-02-09: qty 1
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
## 2019-02-09 NOTE — Progress Notes (Signed)
Orthopedic Tech Progress Note Patient Details:  Randall Patel 11-14-1950 [id number]  Ortho Devices Type of Ortho Device: Ulna gutter splint Ortho Device/Splint Location: URE Ortho Device/Splint Interventions: Adjustment, Application, Ordered   Post Interventions Patient Tolerated: Well Instructions Provided: Care of device, Adjustment of device   Janit Pagan 02/09/2019, 11:38 AM

## 2019-02-09 NOTE — ED Notes (Addendum)
Prev note regarding chest pain was error.
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
## 2019-02-09 NOTE — ED Notes (Signed)
Ortho paged. 
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
## 2019-03-20 ENCOUNTER — Encounter: Payer: Self-pay | Admitting: Physician Assistant

## 2019-03-20 ENCOUNTER — Ambulatory Visit (INDEPENDENT_AMBULATORY_CARE_PROVIDER_SITE_OTHER): Payer: Medicare HMO | Admitting: Physician Assistant

## 2019-03-20 ENCOUNTER — Other Ambulatory Visit: Payer: Self-pay

## 2019-03-20 VITALS — BP 110/70 | HR 50 | Ht 63.0 in | Wt 128.6 lb

## 2019-03-20 DIAGNOSIS — E782 Mixed hyperlipidemia: Secondary | ICD-10-CM | POA: Diagnosis not present

## 2019-03-20 DIAGNOSIS — I255 Ischemic cardiomyopathy: Secondary | ICD-10-CM | POA: Diagnosis not present

## 2019-03-20 DIAGNOSIS — I2510 Atherosclerotic heart disease of native coronary artery without angina pectoris: Secondary | ICD-10-CM

## 2019-03-20 DIAGNOSIS — I10 Essential (primary) hypertension: Secondary | ICD-10-CM

## 2019-03-20 DIAGNOSIS — I6930 Unspecified sequelae of cerebral infarction: Secondary | ICD-10-CM

## 2019-03-20 NOTE — Patient Instructions (Signed)
Medication Instructions:  Your physician recommends that you continue on your current medications as directed. Please refer to the Current Medication list given to you today.  *If you need a refill on your cardiac medications before your next appointment, please call your pharmacy*  Lab Work: TODAY: CMET, CBC, LIPIDS, TSH  If you have labs (blood work) drawn today and your tests are completely normal, you will receive your results only by: [name] MyChart Message (if you have MyChart) OR . A paper copy in the mail If you have any lab test that is abnormal or we need to change your treatment, we will call you to review the results.  Testing/Procedures: None ordered  Follow-Up: At [facility], you and your health needs are our priority.  As part of our continuing mission to provide you with exceptional heart care, we have created designated Provider Care Teams.  These Care Teams include your primary Cardiologist (physician) and Advanced Practice Providers (APPs -  Physician Assistants and Nurse Practitioners) who all work together to provide you with the care you need, when you need it.  Your next appointment:   6 month(s)  The format for your next appointment:   In Person  Provider:   You may see Lauree Chandler, MD or one of the following Advanced Practice Providers on your designated Care Team:    Melina Copa, PA-C  Ermalinda Barrios, PA-C   Other Instructions

## 2019-03-21 LAB — COMPREHENSIVE METABOLIC PANEL
ALT: 16 IU/L (ref 0–44)
AST: 17 IU/L (ref 0–40)
Albumin/Globulin Ratio: 2 (ref 1.2–2.2)
Albumin: 5 g/dL — ABNORMAL HIGH (ref 3.8–4.8)
Alkaline Phosphatase: 60 IU/L (ref 39–117)
BUN/Creatinine Ratio: 15 (ref 10–24)
BUN: 15 mg/dL (ref 8–27)
Bilirubin Total: 0.3 mg/dL (ref 0.0–1.2)
CO2: 22 mmol/L (ref 20–29)
Calcium: 10.3 mg/dL — ABNORMAL HIGH (ref 8.6–10.2)
Chloride: 97 mmol/L (ref 96–106)
Creatinine, Ser: 0.97 mg/dL (ref 0.76–1.27)
GFR calc Af Amer: 94 mL/min/{1.73_m2} (ref >59)
GFR calc non Af Amer: 81 mL/min/{1.73_m2} (ref >59)
Globulin, Total: 2.5 g/dL (ref 1.5–4.5)
Glucose: 103 mg/dL — ABNORMAL HIGH (ref 65–99)
Potassium: 5 mmol/L (ref 3.5–5.2)
Sodium: 136 mmol/L (ref 134–144)
Total Protein: 7.5 g/dL (ref 6.0–8.5)

## 2019-03-21 LAB — LIPID PANEL
Chol/HDL Ratio: 1.9 ratio (ref 0.0–5.0)
Cholesterol, Total: 148 mg/dL (ref 100–199)
HDL: 79 mg/dL (ref >39)
LDL Chol Calc (NIH): 59 mg/dL (ref 0–99)
Triglycerides: 40 mg/dL (ref 0–149)
VLDL Cholesterol Cal: 10 mg/dL (ref 5–40)

## 2019-03-21 LAB — CBC
Hematocrit: 40.9 % (ref 37.5–51.0)
Hemoglobin: 13.2 g/dL (ref 13.0–17.7)
MCH: 27.3 pg (ref 26.6–33.0)
MCHC: 32.3 g/dL (ref 31.5–35.7)
MCV: 85 fL (ref 79–97)
Platelets: 208 10*3/uL (ref 150–450)
RBC: 4.83 x10E6/uL (ref 4.14–5.80)
RDW: 13.1 % (ref 11.6–15.4)
WBC: 4.9 10*3/uL (ref 3.4–10.8)

## 2019-03-21 LAB — TSH: TSH: 1.02 u[IU]/mL (ref 0.450–4.500)

## 2019-04-14 ENCOUNTER — Encounter (HOSPITAL_COMMUNITY): Payer: Self-pay | Admitting: Emergency Medicine

## 2019-04-14 ENCOUNTER — Other Ambulatory Visit: Payer: Self-pay

## 2019-04-14 ENCOUNTER — Emergency Department (HOSPITAL_COMMUNITY)
Admission: EM | Admit: 2019-04-14 | Discharge: 2019-04-14 | Disposition: A | Discharge status: Home / Self Care | Payer: Medicare HMO | Attending: Emergency Medicine | Admitting: Emergency Medicine

## 2019-04-14 DIAGNOSIS — J36 Peritonsillar abscess: Secondary | ICD-10-CM | POA: Insufficient documentation

## 2019-04-14 DIAGNOSIS — Z79899 Other long term (current) drug therapy: Secondary | ICD-10-CM | POA: Diagnosis not present

## 2019-04-14 DIAGNOSIS — I110 Hypertensive heart disease with heart failure: Secondary | ICD-10-CM | POA: Insufficient documentation

## 2019-04-14 DIAGNOSIS — I2510 Atherosclerotic heart disease of native coronary artery without angina pectoris: Secondary | ICD-10-CM | POA: Insufficient documentation

## 2019-04-14 DIAGNOSIS — F17210 Nicotine dependence, cigarettes, uncomplicated: Secondary | ICD-10-CM | POA: Insufficient documentation

## 2019-04-14 DIAGNOSIS — J449 Chronic obstructive pulmonary disease, unspecified: Secondary | ICD-10-CM | POA: Diagnosis not present

## 2019-04-14 DIAGNOSIS — J029 Acute pharyngitis, unspecified: Secondary | ICD-10-CM | POA: Diagnosis present

## 2019-04-14 DIAGNOSIS — Z7982 Long term (current) use of aspirin: Secondary | ICD-10-CM | POA: Insufficient documentation

## 2019-04-14 DIAGNOSIS — I5032 Chronic diastolic (congestive) heart failure: Secondary | ICD-10-CM | POA: Diagnosis not present

## 2019-04-14 DIAGNOSIS — Z20822 Contact with and (suspected) exposure to covid-19: Secondary | ICD-10-CM | POA: Insufficient documentation

## 2019-04-14 LAB — BASIC METABOLIC PANEL
Anion gap: 11 (ref 5–15)
BUN: 12 mg/dL (ref 8–23)
CO2: 24 mmol/L (ref 22–32)
Calcium: 9.8 mg/dL (ref 8.9–10.3)
Chloride: 99 mmol/L (ref 98–111)
Creatinine, Ser: 1.08 mg/dL (ref 0.61–1.24)
GFR calc Af Amer: >60 mL/min (ref >60)
GFR calc non Af Amer: >60 mL/min (ref >60)
Glucose, Bld: 81 mg/dL (ref 70–99)
Potassium: 3.9 mmol/L (ref 3.5–5.1)
Sodium: 134 mmol/L — ABNORMAL LOW (ref 135–145)

## 2019-04-14 LAB — CBC
HCT: 42.1 % (ref 39.0–52.0)
Hemoglobin: 13.1 g/dL (ref 13.0–17.0)
MCH: 27.3 pg (ref 26.0–34.0)
MCHC: 31.1 g/dL (ref 30.0–36.0)
MCV: 87.9 fL (ref 80.0–100.0)
Platelets: 201 10*3/uL (ref 150–400)
RBC: 4.79 MIL/uL (ref 4.22–5.81)
RDW: 14 % (ref 11.5–15.5)
WBC: 8.5 10*3/uL (ref 4.0–10.5)
nRBC: 0 % (ref 0.0–0.2)

## 2019-04-14 LAB — GROUP A STREP BY PCR: Group A Strep by PCR: NOT DETECTED

## 2019-04-14 MED ORDER — CLINDAMYCIN PHOSPHATE 600 MG/50ML IV SOLN
600.0000 mg | Freq: Once | INTRAVENOUS | Status: AC
  Administered 2019-04-14: 23:00:00 600 mg via INTRAVENOUS
  Filled 2019-04-14: qty 50

## 2019-04-14 MED ORDER — DEXAMETHASONE SODIUM PHOSPHATE 10 MG/ML IJ SOLN
10.0000 mg | Freq: Once | INTRAMUSCULAR | Status: AC
  Administered 2019-04-14: 22:00:00 10 mg via INTRAVENOUS
  Filled 2019-04-14: qty 1

## 2019-04-14 MED ORDER — FENTANYL CITRATE (PF) 100 MCG/2ML IJ SOLN
100.0000 ug | Freq: Once | INTRAMUSCULAR | Status: AC
  Administered 2019-04-14: 22:00:00 100 ug via INTRAVENOUS
  Filled 2019-04-14: qty 2

## 2019-04-14 MED ORDER — OXYCODONE HCL 5 MG PO CAPS: 5.0000 mg | ORAL_CAPSULE | Freq: Four times a day (QID) | ORAL | 0 refills | Status: DC | PRN

## 2019-04-14 MED ORDER — KETOROLAC TROMETHAMINE 15 MG/ML IJ SOLN
15.0000 mg | Freq: Once | INTRAMUSCULAR | Status: AC
  Administered 2019-04-14: 15 mg via INTRAVENOUS
  Filled 2019-04-14: qty 1

## 2019-04-14 MED ORDER — CLINDAMYCIN HCL 150 MG PO CAPS: 300.0000 mg | ORAL_CAPSULE | Freq: Four times a day (QID) | ORAL | 0 refills | Status: DC

## 2019-04-14 MED ORDER — SODIUM CHLORIDE 0.9 % IV BOLUS
1000.0000 mL | Freq: Once | INTRAVENOUS | Status: AC
  Administered 2019-04-14: 1000 mL via INTRAVENOUS

## 2019-04-14 NOTE — ED Notes (Signed)
Pt in yellow zone

## 2019-04-14 NOTE — Discharge Instructions (Signed)
Please read and follow all provided instructions.  Your diagnoses today include:  1. Peritonsillar abscess     Tests performed today include:  Blood counts and electrolytes -look good  Strep test-was negative  Vital signs. See below for your results today.   Medications prescribed:   Clindamycin - antibiotic  You have been prescribed an antibiotic medicine: take the entire course of medicine even if you are feeling better. Stopping early can cause the antibiotic not to work.   Oxycodone - narcotic pain medication  DO NOT drive or perform any activities that require you to be awake and alert because this medicine can make you drowsy.   Take any prescribed medications only as directed.  Home care instructions:  Follow any educational materials contained in this packet.  BE VERY CAREFUL not to take multiple medicines containing Tylenol (also called acetaminophen). Doing so can lead to an overdose which can damage your liver and cause liver failure and possibly death.   Follow-up instructions:  If on Monday you continue to have a significant sore throat, you should call the throat doctor listed.  Tell them you were seen in the emergency department and asked to make an appointment.  If your symptoms get worse, your pain is uncontrolled, you cannot swallow --please return to the emergency department.  Return instructions:   Please return to the Emergency Department if you experience worsening symptoms.   Please return if you are unable to swallow, have severe pain, have trouble breathing.  Please return if you have any other emergent concerns.  Additional Information:  Your vital signs today were: BP 111/72   Pulse 84   Temp 99 F (37.2 C) (Oral)   Resp 14   SpO2 99%  If your blood pressure (BP) was elevated above 135/85 this visit, please have this repeated by your doctor within one month. --------------

## 2019-04-14 NOTE — ED Provider Notes (Signed)
[facility] DEPARTMENT Provider Note   CSN: [id number] Arrival date & time: 04/14/19  1804     History Chief Complaint  Patient presents with  . Sore Throat    Randall Patel is a 69 y.o. male.  Patient with h/o EtOH abuse, HTN, CAD -- presents with c/o sore throat ongoing over the past 4 to 5 days.  He states that is been difficult to swallow because of the pain.  He has been keeping down some fluids.  He states that the pain went from being generalized to located on the right side.  No fevers, nausea or vomiting.  No known sick contacts or coronavirus contacts.  He denies any chest pain or shortness of breath.  The pain does not radiate to the jaw, shoulders, or back.  No difficulty breathing. The onset of this condition was acute. The course is constant. Aggravating factors: none. Alleviating factors: none.          Past Medical History:  Diagnosis Date  . Anginal pain ([place])   . Arthritis   . CAD (coronary artery disease)    a. MI in 2008 w PL stent b. MI 03/2012: 30% ostial LM stenosis, 30% pLAD stenosis, 60% mid-LADm 30% pCx, 3rd OM which was small in size with diffuse 90% stenosis involving both branches, and RCA with 50% mid-stenosis and 100% distal occlusion. POBA to PL branch and DES placement to dRCA.  [name] CHF (congestive heart failure) ([place])   . Claudication ([place])   . CVA (cerebral infarction)    03/2012 with left arm weakness/numbness and left facial droop.  [name] ETOH abuse    quit 04/06/12  . HTN (hypertension)   . Hyperlipidemia   . Ischemic cardiomyopathy    a. EF 35-40% by echo 2014 b.EF 40-45% by echo in 03/2015  . Myocardial infarction ([place])   . Seizures ([place])   . Stroke ([place]) 03/2015   R frontal hemorrhage  . Tobacco abuse    quit 04/06/12  . Withdrawal seizures [facility])     Patient Active Problem List   Diagnosis Date Noted  . Chest pain 04/02/2018  . Anoxic brain injury ([place]) 03/31/2016  . Pulseless electrical activity ([place])    . Chronic obstructive pulmonary disease ([place])   . Dysphagia, post-stroke   . Abnormal CT of brain   . Coronary artery disease involving native coronary artery of native heart without angina pectoris   . ETOH abuse   . Tachypnea   . Benign essential HTN   . Hyperglycemia   . Pain   . Leukocytosis   . Acute pulmonary edema (HCC)   . Respiratory failure ([place])   . Sinus tachycardia   . Cardiac arrest ([place]) 03/23/2016  . Palpitations 08/15/2015  . History of stroke 08/15/2015  . Anterior cerebral circulation hemorrhagic infarction ([place]) 08/15/2015  . Hyperlipidemia 08/15/2015  . Muscle stiffness   . Cough   . Hemiparesis affecting left side as late effect of stroke ([place])   . Dysphagia as late effect of cerebrovascular disease   . Essential hypertension   . Acute blood loss anemia   . 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 ([place]) 04/10/2015  . Dysphagia   . Dysarthria due to cerebrovascular accident   . Diastolic dysfunction   . Tobacco abuse   . ETOH abuse   . History of CVA with residual deficit   . Coronary artery disease involving native coronary artery of native heart with angina pectoris (  HCC)   . Tachypnea   . Hypernatremia   . Hypokalemia   . Thrombocytopenia (HCC)   . Intracranial hemorrhage (HCC)   . Essential hypertension, malignant   . ICH (intracerebral hemorrhage) (HCC) 04/04/2015  . STEMI (ST elevation myocardial infarction) (HCC) 04/14/2012  . NSTEMI (non-ST elevated myocardial infarction) (HCC) 04/14/2012  . Cardiomyopathy, ischemic 04/14/2012  . Cerebral embolism with cerebral infarction (HCC) 04/03/2012  . Unspecified transient cerebral ischemia 04/02/2012  . Hemiplegia, unspecified, affecting nondominant side 04/02/2012  . CAD (coronary artery disease) s/p stent 2008 in [place] 04/02/2012  . Hypertension     Past Surgical History:  Procedure Laterality Date  . CORONARY ANGIOPLASTY WITH STENT PLACEMENT  03/2012  . HERNIA REPAIR    . LEFT  HEART CATHETERIZATION WITH CORONARY ANGIOGRAM N/A 04/11/2012   Procedure: LEFT HEART CATHETERIZATION WITH CORONARY ANGIOGRAM;  Surgeon: Kathleene Hazel, MD;  Location: [facility] CATH LAB;  Service: Cardiovascular;  Laterality: N/A;  . PERCUTANEOUS CORONARY INTERVENTION-BALLOON ONLY  04/11/2012   Procedure: PERCUTANEOUS CORONARY INTERVENTION-BALLOON ONLY;  Surgeon: Kathleene Hazel, MD;  Location: [facility] CATH LAB;  Service: Cardiovascular;;  RPLA  . PERCUTANEOUS CORONARY STENT INTERVENTION (PCI-S)  04/11/2012   Procedure: PERCUTANEOUS CORONARY STENT INTERVENTION (PCI-S);  Surgeon: Kathleene Hazel, MD;  Location: [facility] CATH LAB;  Service: Cardiovascular;;  Distal RCA       Family History  Problem Relation Age of Onset  . Heart attack Father   . Hypertension Father   . Cancer Mother   . Stroke Sister   . Hypertension Mother   . Hypertension Sister   . Hypertension Brother     Social History   Tobacco Use  . Smoking status: Current Every Day Smoker    Packs/day: 0.50    Years: 40.00    Pack years: 20.00    Types: Cigarettes  . Smokeless tobacco: Never Used  Substance Use Topics  . Alcohol use: Yes    Comment: "bourbon"  . Drug use: No    Types: Benzodiazepines, Marijuana    Home Medications Prior to Admission medications   Medication Sig Start Date End Date Taking? Authorizing Provider  aspirin EC 81 MG tablet Take 81 mg by mouth daily.    [provider]  atorvastatin (LIPITOR) 80 MG tablet Take 1 tablet (80 mg total) by mouth daily at 6 PM. 08/02/18   Dyann Kief, PA-C  calcium carbonate (TUMS EX) 750 MG chewable tablet Chew 1 tablet (750 mg total) by mouth 3 (three) times daily as needed for heartburn. 04/03/18   Claudean Severance, MD  carvedilol (COREG) 25 MG tablet Take 1 tablet by mouth 2 (two) times a day. 07/31/18   [provider]  fluticasone (FLONASE) 50 MCG/ACT nasal spray Place into both nostrils daily.    [provider]    hydrALAZINE (APRESOLINE) 50 MG tablet Take 1 tablet by mouth 3 (three) times daily. 07/31/18   [provider]  HYDROcodone-acetaminophen (NORCO/VICODIN) 5-325 MG tablet Take 1-2 tablets by mouth every 6 (six) hours as needed. 02/06/19   Maxwell Caul, PA-C  irbesartan (AVAPRO) 150 MG tablet Take 1 tablet (150 mg total) by mouth daily. 08/02/18   Dyann Kief, PA-C  pantoprazole (PROTONIX) 40 MG tablet Take 1 tablet (40 mg total) by mouth daily. 04/04/18   Claudean Severance, MD  spironolactone (ALDACTONE) 25 MG tablet Take 1 tablet (25 mg total) by mouth daily. 09/12/18   Dyann Kief, PA-C  thiamine 100 MG tablet  Take 1 tablet (100 mg total) by mouth daily. 04/14/16   Love, Evlyn Kanner, PA-C    Allergies    Lisinopril  Review of Systems   Review of Systems  Constitutional: Negative for chills and fever.  HENT: Positive for sore throat and trouble swallowing. Negative for rhinorrhea.   Eyes: Negative for redness.  Respiratory: Negative for cough and shortness of breath.   Cardiovascular: Negative for chest pain.  Gastrointestinal: Negative for abdominal pain, diarrhea, nausea and vomiting.  Genitourinary: Negative for dysuria.  Musculoskeletal: Negative for myalgias.  Skin: Negative for rash.  Neurological: Negative for headaches.    Physical Exam Updated Vital Signs BP 140/81 (BP Location: Right Arm)   Pulse 80   Temp 99 F (37.2 C) (Oral)   Resp 14   SpO2 94%   Physical Exam Vitals and nursing note reviewed.  Constitutional:      Appearance: He is well-developed.  HENT:     Head: Normocephalic and atraumatic.     Mouth/Throat:     Pharynx: Pharyngeal swelling and posterior oropharyngeal erythema present.     Tonsils: Tonsillar abscess present. No tonsillar exudate.     Comments: There is fullness of the right posterior pharynx with slight displacement of the uvula consistent with an early peritonsillar abscess.  No exudates noted. Eyes:     General:         Right eye: No discharge.        Left eye: No discharge.     Conjunctiva/sclera: Conjunctivae normal.  Cardiovascular:     Rate and Rhythm: Normal rate and regular rhythm.     Heart sounds: Normal heart sounds.  Pulmonary:     Effort: Pulmonary effort is normal.     Breath sounds: Normal breath sounds.  Abdominal:     Palpations: Abdomen is soft.     Tenderness: There is no abdominal tenderness.  Musculoskeletal:     Cervical back: Normal range of motion and neck supple.  Skin:    General: Skin is warm and dry.  Neurological:     Mental Status: He is alert.     ED Results / Procedures / Treatments   Labs (all labs ordered are listed, but only abnormal results are displayed) Labs Reviewed  BASIC METABOLIC PANEL - Abnormal; Notable for the following components:      Result Value   Sodium 134 (*)    All other components within normal limits  GROUP A STREP BY PCR  SARS CORONAVIRUS 2 (TAT 6-24 HRS)  CBC    EKG None  Radiology No results found.  Procedures Procedures (including critical care time)  Medications Ordered in ED Medications  clindamycin (CLEOCIN) IVPB 600 mg (600 mg Intravenous New Bag/Given 04/14/19 2241)  dexamethasone (DECADRON) injection 10 mg (10 mg Intravenous Given 04/14/19 2222)  ketorolac (TORADOL) 15 MG/ML injection 15 mg (15 mg Intravenous Given 04/14/19 2226)  sodium chloride 0.9 % bolus 1,000 mL (1,000 mLs Intravenous New Bag/Given 04/14/19 2222)  fentaNYL (SUBLIMAZE) injection 100 mcg (100 mcg Intravenous Given 04/14/19 2224)    ED Course  I have reviewed the triage vital signs and the nursing notes.  Pertinent labs & imaging results that were available during my care of the patient were reviewed by me and considered in my medical decision making (see chart for details).  Patient seen and examined.  Area of peritonsillar inflammation is small at this time however clearly appears to be an early peritonsillar abscess.  Given age will check lab  work  and treat with pain medication, clindamycin, Decadron.  Will reassess.  Vital signs reviewed and are as follows: BP 140/81 (BP Location: Right Arm)   Pulse 80   Temp 99 F (37.2 C) (Oral)   Resp 14   SpO2 94%   11:39 PM patient rechecked after receiving medications.  He states that he is feeling better.  He is requesting water and I have observed him sipping water from a straw in the room without any difficulties.  Current plan is to discharge him to home.  Will give prescription for clindamycin and oxycodone.  He is instructed to monitor symptoms over the next 24 to 48 hours.  If he continues to have significant sore throat on 04/16/2019, he should follow-up with the ENT listed.  We discussed signs symptoms that should cause him to return sooner including inability to swallow, uncontrolled pain, trouble breathing, fevers or other concerns.  Patient verbalizes understanding agrees with plan.     MDM Rules/Calculators/A&P                      Well-appearing patient with peritonsillar cellulitis versus early peritonsillar abscess.  This area is small.  It is not causing any obstruction.  I do not suspect any deep space infection of the neck.  Patient has no trismus and can move his neck freely without any difficulties.  Patient has obvious signs on exam and I do not suspect that his throat pain is an anginal equivalent today.  Patient has received a dose of IV antibiotics.  Given age and comorbidities, lab work checked.  This is reassuring.  Patient has responded well to treatment in the ED.  I do not feel that area would require drainage at this time given its small size.  It will likely declare itself over the next 24 to 48 hours.  ENT follow-up given for patient if symptoms are not improved and we also discussed signs and symptoms which should cause him to return.   Final Clinical Impression(s) / ED Diagnoses Final diagnoses:  Peritonsillar abscess    Rx / DC Orders ED Discharge Orders     None       Renne Crigler, PA-C 04/14/19 2341    Milagros Loll, MD 04/16/19 1328

## 2019-04-14 NOTE — ED Triage Notes (Signed)
Pt endorses sore throat since Tuesday. States he has tried OTC spray but no relief.

## 2019-04-15 LAB — SARS CORONAVIRUS 2 (TAT 6-24 HRS): SARS Coronavirus 2: NEGATIVE

## 2019-08-14 ENCOUNTER — Telehealth: Payer: Self-pay | Admitting: Adult Health

## 2019-08-14 NOTE — Telephone Encounter (Signed)
Attempted to call patient about homebound COVID19 vaccination program.  No answer.  Unable to leave voice mail.  Lillard Anes, NP

## 2019-08-15 ENCOUNTER — Telehealth: Payer: Self-pay | Admitting: Physician Assistant

## 2019-08-15 NOTE — Telephone Encounter (Signed)
Attempted to call patient about homebound COVID19 vaccination program.  No answer.  Unable to leave voice mail.  Kalem Rockwell, PA - C   

## 2019-08-24 ENCOUNTER — Inpatient Hospital Stay (HOSPITAL_COMMUNITY): Payer: Medicare HMO

## 2019-08-24 ENCOUNTER — Encounter (HOSPITAL_COMMUNITY): Payer: Self-pay

## 2019-08-24 ENCOUNTER — Emergency Department (HOSPITAL_COMMUNITY): Payer: Medicare HMO

## 2019-08-24 ENCOUNTER — Inpatient Hospital Stay (HOSPITAL_COMMUNITY)
Admission: EM | Admit: 2019-08-24 | Discharge: 2019-09-11 | DRG: 164 | Disposition: A | Discharge status: Home / Self Care | Payer: Medicare HMO | Attending: Cardiothoracic Surgery | Admitting: Cardiothoracic Surgery

## 2019-08-24 ENCOUNTER — Other Ambulatory Visit: Payer: Self-pay

## 2019-08-24 DIAGNOSIS — Z20822 Contact with and (suspected) exposure to covid-19: Secondary | ICD-10-CM | POA: Diagnosis present

## 2019-08-24 DIAGNOSIS — K5900 Constipation, unspecified: Secondary | ICD-10-CM | POA: Diagnosis not present

## 2019-08-24 DIAGNOSIS — E785 Hyperlipidemia, unspecified: Secondary | ICD-10-CM | POA: Diagnosis not present

## 2019-08-24 DIAGNOSIS — I69392 Facial weakness following cerebral infarction: Secondary | ICD-10-CM

## 2019-08-24 DIAGNOSIS — Z955 Presence of coronary angioplasty implant and graft: Secondary | ICD-10-CM

## 2019-08-24 DIAGNOSIS — Z809 Family history of malignant neoplasm, unspecified: Secondary | ICD-10-CM

## 2019-08-24 DIAGNOSIS — F17210 Nicotine dependence, cigarettes, uncomplicated: Secondary | ICD-10-CM | POA: Diagnosis present

## 2019-08-24 DIAGNOSIS — Z823 Family history of stroke: Secondary | ICD-10-CM

## 2019-08-24 DIAGNOSIS — Z8674 Personal history of sudden cardiac arrest: Secondary | ICD-10-CM

## 2019-08-24 DIAGNOSIS — I454 Nonspecific intraventricular block: Secondary | ICD-10-CM | POA: Diagnosis not present

## 2019-08-24 DIAGNOSIS — R1310 Dysphagia, unspecified: Secondary | ICD-10-CM | POA: Diagnosis present

## 2019-08-24 DIAGNOSIS — I69322 Dysarthria following cerebral infarction: Secondary | ICD-10-CM

## 2019-08-24 DIAGNOSIS — I69391 Dysphagia following cerebral infarction: Secondary | ICD-10-CM | POA: Diagnosis not present

## 2019-08-24 DIAGNOSIS — E871 Hypo-osmolality and hyponatremia: Secondary | ICD-10-CM | POA: Diagnosis not present

## 2019-08-24 DIAGNOSIS — Z9689 Presence of other specified functional implants: Secondary | ICD-10-CM

## 2019-08-24 DIAGNOSIS — J9382 Other air leak: Secondary | ICD-10-CM | POA: Diagnosis not present

## 2019-08-24 DIAGNOSIS — I255 Ischemic cardiomyopathy: Secondary | ICD-10-CM | POA: Diagnosis present

## 2019-08-24 DIAGNOSIS — I959 Hypotension, unspecified: Secondary | ICD-10-CM | POA: Diagnosis not present

## 2019-08-24 DIAGNOSIS — Z9889 Other specified postprocedural states: Secondary | ICD-10-CM

## 2019-08-24 DIAGNOSIS — J439 Emphysema, unspecified: Secondary | ICD-10-CM | POA: Diagnosis present

## 2019-08-24 DIAGNOSIS — I10 Essential (primary) hypertension: Secondary | ICD-10-CM | POA: Diagnosis present

## 2019-08-24 DIAGNOSIS — Z8249 Family history of ischemic heart disease and other diseases of the circulatory system: Secondary | ICD-10-CM | POA: Diagnosis not present

## 2019-08-24 DIAGNOSIS — N289 Disorder of kidney and ureter, unspecified: Secondary | ICD-10-CM | POA: Diagnosis not present

## 2019-08-24 DIAGNOSIS — Z888 Allergy status to other drugs, medicaments and biological substances status: Secondary | ICD-10-CM

## 2019-08-24 DIAGNOSIS — R079 Chest pain, unspecified: Secondary | ICD-10-CM | POA: Diagnosis present

## 2019-08-24 DIAGNOSIS — J9311 Primary spontaneous pneumothorax: Secondary | ICD-10-CM | POA: Diagnosis present

## 2019-08-24 DIAGNOSIS — R001 Bradycardia, unspecified: Secondary | ICD-10-CM | POA: Diagnosis not present

## 2019-08-24 DIAGNOSIS — J939 Pneumothorax, unspecified: Secondary | ICD-10-CM

## 2019-08-24 DIAGNOSIS — I25119 Atherosclerotic heart disease of native coronary artery with unspecified angina pectoris: Secondary | ICD-10-CM | POA: Diagnosis present

## 2019-08-24 DIAGNOSIS — Z7982 Long term (current) use of aspirin: Secondary | ICD-10-CM

## 2019-08-24 DIAGNOSIS — I69354 Hemiplegia and hemiparesis following cerebral infarction affecting left non-dominant side: Secondary | ICD-10-CM | POA: Diagnosis not present

## 2019-08-24 DIAGNOSIS — D62 Acute posthemorrhagic anemia: Secondary | ICD-10-CM | POA: Diagnosis not present

## 2019-08-24 DIAGNOSIS — I252 Old myocardial infarction: Secondary | ICD-10-CM | POA: Diagnosis not present

## 2019-08-24 DIAGNOSIS — R109 Unspecified abdominal pain: Secondary | ICD-10-CM

## 2019-08-24 LAB — BASIC METABOLIC PANEL
Anion gap: 8 (ref 5–15)
Anion gap: 10 (ref 5–15)
BUN: 17 mg/dL (ref 8–23)
BUN: 15 mg/dL (ref 8–23)
CO2: 25 mmol/L (ref 22–32)
CO2: 26 mmol/L (ref 22–32)
Calcium: 9.6 mg/dL (ref 8.9–10.3)
Calcium: 10.1 mg/dL (ref 8.9–10.3)
Chloride: 102 mmol/L (ref 98–111)
Chloride: 98 mmol/L (ref 98–111)
Creatinine, Ser: 0.93 mg/dL (ref 0.61–1.24)
Creatinine, Ser: 0.96 mg/dL (ref 0.61–1.24)
GFR calc Af Amer: >60 mL/min (ref >60)
GFR calc Af Amer: >60 mL/min (ref >60)
GFR calc non Af Amer: >60 mL/min (ref >60)
GFR calc non Af Amer: >60 mL/min (ref >60)
Glucose, Bld: 110 mg/dL — ABNORMAL HIGH (ref 70–99)
Glucose, Bld: 138 mg/dL — ABNORMAL HIGH (ref 70–99)
Potassium: 4.3 mmol/L (ref 3.5–5.1)
Potassium: 4.5 mmol/L (ref 3.5–5.1)
Sodium: 135 mmol/L (ref 135–145)
Sodium: 134 mmol/L — ABNORMAL LOW (ref 135–145)

## 2019-08-24 LAB — HEPATIC FUNCTION PANEL
ALT: 20 U/L (ref 0–44)
AST: 18 U/L (ref 15–41)
Albumin: 4 g/dL (ref 3.5–5.0)
Alkaline Phosphatase: 47 U/L (ref 38–126)
Bilirubin, Direct: <0.1 mg/dL (ref 0.0–0.2)
Total Bilirubin: 0.6 mg/dL (ref 0.3–1.2)
Total Protein: 7.7 g/dL (ref 6.5–8.1)

## 2019-08-24 LAB — CBC
HCT: 38.5 % — ABNORMAL LOW (ref 39.0–52.0)
HCT: 41.7 % (ref 39.0–52.0)
Hemoglobin: 12.2 g/dL — ABNORMAL LOW (ref 13.0–17.0)
Hemoglobin: 13 g/dL (ref 13.0–17.0)
MCH: 27.6 pg (ref 26.0–34.0)
MCH: 27.5 pg (ref 26.0–34.0)
MCHC: 31.7 g/dL (ref 30.0–36.0)
MCHC: 31.2 g/dL (ref 30.0–36.0)
MCV: 87.1 fL (ref 80.0–100.0)
MCV: 88.2 fL (ref 80.0–100.0)
Platelets: 181 10*3/uL (ref 150–400)
Platelets: 202 10*3/uL (ref 150–400)
RBC: 4.42 MIL/uL (ref 4.22–5.81)
RBC: 4.73 MIL/uL (ref 4.22–5.81)
RDW: 13.8 % (ref 11.5–15.5)
RDW: 13.7 % (ref 11.5–15.5)
WBC: 4.7 10*3/uL (ref 4.0–10.5)
WBC: 5.6 10*3/uL (ref 4.0–10.5)
nRBC: 0 % (ref 0.0–0.2)
nRBC: 0 % (ref 0.0–0.2)

## 2019-08-24 LAB — LIPASE, BLOOD: Lipase: 33 U/L (ref 11–51)

## 2019-08-24 LAB — HIV ANTIBODY (ROUTINE TESTING W REFLEX): HIV Screen 4th Generation wRfx: NONREACTIVE

## 2019-08-24 LAB — CBG MONITORING, ED: Glucose-Capillary: 125 mg/dL — ABNORMAL HIGH (ref 70–99)

## 2019-08-24 LAB — TROPONIN I (HIGH SENSITIVITY)
Troponin I (High Sensitivity): 5 ng/L (ref <18)
Troponin I (High Sensitivity): 6 ng/L (ref <18)

## 2019-08-24 LAB — SARS CORONAVIRUS 2 BY RT PCR (HOSPITAL ORDER, PERFORMED IN ~~LOC~~ HOSPITAL LAB): SARS Coronavirus 2: NEGATIVE

## 2019-08-24 LAB — PROTIME-INR
INR: 1 (ref 0.8–1.2)
Prothrombin Time: 13 seconds (ref 11.4–15.2)

## 2019-08-24 MED ORDER — ONDANSETRON HCL 4 MG PO TABS
4.0000 mg | ORAL_TABLET | Freq: Four times a day (QID) | ORAL | Status: DC | PRN
  Administered 2019-08-25: 4 mg via ORAL
  Filled 2019-08-24: qty 1

## 2019-08-24 MED ORDER — CARVEDILOL 25 MG PO TABS
25.0000 mg | ORAL_TABLET | Freq: Two times a day (BID) | ORAL | Status: DC
  Administered 2019-08-24 (×2): 25 mg via ORAL
  Filled 2019-08-24 (×2): qty 1
  Filled 2019-08-24: qty 2
  Filled 2019-08-24: qty 1

## 2019-08-24 MED ORDER — FENTANYL CITRATE (PF) 100 MCG/2ML IJ SOLN: 25.0000 ug | INTRAMUSCULAR | Status: DC | PRN

## 2019-08-24 MED ORDER — PANTOPRAZOLE SODIUM 40 MG IV SOLR
40.0000 mg | Freq: Once | INTRAVENOUS | Status: AC
  Administered 2019-08-24: 40 mg via INTRAVENOUS
  Filled 2019-08-24: qty 40

## 2019-08-24 MED ORDER — SODIUM CHLORIDE 0.9% FLUSH
10.0000 mL | Freq: Three times a day (TID) | INTRAVENOUS | Status: DC
  Administered 2019-08-24 – 2019-08-28 (×10): 10 mL

## 2019-08-24 MED ORDER — SORBITOL 70 % SOLN
30.0000 mL | Freq: Every day | Status: DC | PRN
  Administered 2019-08-27: 30 mL via ORAL
  Filled 2019-08-24 (×2): qty 30

## 2019-08-24 MED ORDER — ATORVASTATIN CALCIUM 80 MG PO TABS
80.0000 mg | ORAL_TABLET | Freq: Every day | ORAL | Status: DC
  Administered 2019-08-24 – 2019-09-07 (×14): 80 mg via ORAL
  Filled 2019-08-24 (×15): qty 1

## 2019-08-24 MED ORDER — PANTOPRAZOLE SODIUM 40 MG PO TBEC
40.0000 mg | DELAYED_RELEASE_TABLET | Freq: Every day | ORAL | Status: DC
  Administered 2019-08-25 – 2019-09-06 (×11): 40 mg via ORAL
  Filled 2019-08-24 (×11): qty 1

## 2019-08-24 MED ORDER — OXYCODONE HCL 5 MG PO TABS
5.0000 mg | ORAL_TABLET | ORAL | Status: DC | PRN
  Administered 2019-08-24 – 2019-09-02 (×4): 5 mg via ORAL
  Filled 2019-08-24 (×4): qty 1

## 2019-08-24 MED ORDER — ENOXAPARIN SODIUM 40 MG/0.4ML ~~LOC~~ SOLN
40.0000 mg | SUBCUTANEOUS | Status: DC
  Administered 2019-08-24 – 2019-08-26 (×3): 40 mg via SUBCUTANEOUS
  Filled 2019-08-24 (×3): qty 0.4

## 2019-08-24 MED ORDER — ONDANSETRON HCL 4 MG/2ML IJ SOLN
4.0000 mg | Freq: Four times a day (QID) | INTRAMUSCULAR | Status: DC | PRN
  Administered 2019-08-24: 4 mg via INTRAVENOUS
  Filled 2019-08-24: qty 2

## 2019-08-24 MED ORDER — DOCUSATE SODIUM 100 MG PO CAPS
100.0000 mg | ORAL_CAPSULE | Freq: Two times a day (BID) | ORAL | Status: DC
  Administered 2019-08-24 – 2019-09-11 (×36): 100 mg via ORAL
  Filled 2019-08-24 (×37): qty 1

## 2019-08-24 MED ORDER — LIDOCAINE-EPINEPHRINE (PF) 2 %-1:200000 IJ SOLN
20.0000 mL | Freq: Once | INTRAMUSCULAR | Status: AC
  Administered 2019-08-24: 20 mL
  Filled 2019-08-24: qty 20

## 2019-08-24 MED ORDER — HYDROMORPHONE HCL 1 MG/ML IJ SOLN
0.5000 mg | INTRAMUSCULAR | Status: DC | PRN
  Administered 2019-08-24: 1 mg via INTRAVENOUS
  Filled 2019-08-24: qty 1

## 2019-08-24 MED ORDER — SODIUM CHLORIDE 0.45 % IV SOLN: INTRAVENOUS | Status: DC

## 2019-08-24 MED ORDER — TRAMADOL HCL 50 MG PO TABS
50.0000 mg | ORAL_TABLET | Freq: Three times a day (TID) | ORAL | Status: DC | PRN
  Administered 2019-08-25 – 2019-09-05 (×15): 50 mg via ORAL
  Filled 2019-08-24 (×16): qty 1

## 2019-08-24 MED ORDER — IRBESARTAN 150 MG PO TABS
150.0000 mg | ORAL_TABLET | Freq: Every day | ORAL | Status: DC
  Administered 2019-08-24 – 2019-08-30 (×7): 150 mg via ORAL
  Filled 2019-08-24 (×7): qty 1

## 2019-08-24 MED ORDER — SODIUM CHLORIDE 0.9% FLUSH: 3.0000 mL | Freq: Once | INTRAVENOUS | Status: DC

## 2019-08-24 MED ORDER — SODIUM CHLORIDE 0.9% FLUSH
3.0000 mL | Freq: Two times a day (BID) | INTRAVENOUS | Status: DC
  Administered 2019-08-24 – 2019-09-11 (×31): 3 mL via INTRAVENOUS

## 2019-08-24 MED ORDER — FENTANYL CITRATE (PF) 100 MCG/2ML IJ SOLN
50.0000 ug | Freq: Once | INTRAMUSCULAR | Status: AC
  Administered 2019-08-24: 50 ug via INTRAVENOUS
  Filled 2019-08-24: qty 2

## 2019-08-24 MED ORDER — ASPIRIN EC 81 MG PO TBEC
81.0000 mg | DELAYED_RELEASE_TABLET | Freq: Every day | ORAL | Status: DC
  Administered 2019-08-24 – 2019-09-07 (×14): 81 mg via ORAL
  Filled 2019-08-24 (×15): qty 1

## 2019-08-24 MED ORDER — FENTANYL CITRATE (PF) 100 MCG/2ML IJ SOLN
50.0000 ug | Freq: Once | INTRAMUSCULAR | Status: DC
  Filled 2019-08-24: qty 2

## 2019-08-24 MED ORDER — ADULT MULTIVITAMIN W/MINERALS CH
1.0000 | ORAL_TABLET | Freq: Every day | ORAL | Status: DC
  Administered 2019-08-24 – 2019-09-11 (×18): 1 via ORAL
  Filled 2019-08-24 (×19): qty 1

## 2019-08-24 NOTE — ED Notes (Signed)
PA  W/ cardiothoracic surgical services at bedside.

## 2019-08-24 NOTE — ED Notes (Signed)
Lunch Tray Ordered @ 1051. 

## 2019-08-24 NOTE — ED Notes (Signed)
Pt to xray
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
## 2019-08-24 NOTE — ED Notes (Signed)
Randall Patel, sister would like an update [phone]

## 2019-08-24 NOTE — ED Triage Notes (Signed)
Patient came in via [facility] from home with c/o chest pain that began around 12:00-12:30 this morning while pt was watching tv. Pain is a dull feeling that doesn't radiate anywhere. Pt states he has hx of MI in the past and this pain doesn't feel like a MI. He states he took some Tums and a Nitro before calling EMS. EMS reports giving 2 doses of Nitro and 324 mg of Aspirin in route.

## 2019-08-24 NOTE — ED Notes (Signed)
Leah from OR; called back for cardiothoracic surgery services, information provided regarding patient's symptoms, asked to notify ER MD.

## 2019-08-24 NOTE — ED Notes (Signed)
Pt c/o dizziness and  Nausea. VSS. Noted patient is diaphoretic. Awake and speaking, will notify admitting team.

## 2019-08-24 NOTE — ED Provider Notes (Signed)
[facility] EMERGENCY DEPARTMENT Provider Note   CSN: [id number] Arrival date & time: 08/24/19  6644     History Chief Complaint  Patient presents with  . Chest Pain    Randall Patel is a 69 y.o. male.   Chest Pain Pain location:  L chest Pain quality: aching and dull   Pain radiates to:  Does not radiate Pain severity:  Mild Duration:  2 hours Timing:  Constant Chronicity:  New Context: breathing   Relieved by:  None tried Worsened by:  Certain positions      Past Medical History:  Diagnosis Date  . Anginal pain ([place])   . Arthritis   . CAD (coronary artery disease)    a. MI in 2008 w PL stent b. MI 03/2012: 30% ostial LM stenosis, 30% pLAD stenosis, 60% mid-LADm 30% pCx, 3rd OM which was small in size with diffuse 90% stenosis involving both branches, and RCA with 50% mid-stenosis and 100% distal occlusion. POBA to PL branch and DES placement to dRCA.  [name] CHF (congestive heart failure) ([place])   . Claudication ([place])   . CVA (cerebral infarction)    03/2012 with left arm weakness/numbness and left facial droop.  [name] ETOH abuse    quit 04/06/12  . HTN (hypertension)   . Hyperlipidemia   . Ischemic cardiomyopathy    a. EF 35-40% by echo 2014 b.EF 40-45% by echo in 03/2015  . Myocardial infarction ([place])   . Seizures ([place])   . Stroke ([place]) 03/2015   R frontal hemorrhage  . Tobacco abuse    quit 04/06/12  . Withdrawal seizures [facility])     Patient Active Problem List   Diagnosis Date Noted  . Pneumothorax on left 08/24/2019  . Chest pain 04/02/2018  . Anoxic brain injury ([place]) 03/31/2016  . Pulseless electrical activity ([place])   . Chronic obstructive pulmonary disease ([place])   . Dysphagia, post-stroke   . Abnormal CT of brain   . Coronary artery disease involving native coronary artery of native heart without angina pectoris   . ETOH abuse   . Tachypnea   . Benign essential HTN   . Hyperglycemia   . Pain   . Leukocytosis   . Acute pulmonary  edema (HCC)   . Respiratory failure ([place])   . Sinus tachycardia   . Cardiac arrest ([place]) 03/23/2016  . Palpitations 08/15/2015  . History of stroke 08/15/2015  . Anterior cerebral circulation hemorrhagic infarction ([place]) 08/15/2015  . Hyperlipidemia 08/15/2015  . Muscle stiffness   . Cough   . Hemiparesis affecting left side as late effect of stroke ([place])   . Dysphagia as late effect of cerebrovascular disease   . Essential hypertension   . Acute blood loss anemia   . 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 ([place]) 04/10/2015  . Dysphagia   . Dysarthria due to cerebrovascular accident   . Diastolic dysfunction   . Tobacco abuse   . ETOH abuse   . History of CVA with residual deficit   . Coronary artery disease involving native coronary artery of native heart with angina pectoris ([place])   . Tachypnea   . Hypernatremia   . Hypokalemia   . Thrombocytopenia ([place])   . Intracranial hemorrhage ([place])   . Essential hypertension, malignant   . ICH (intracerebral hemorrhage) ([place]) 04/04/2015  . STEMI (ST elevation myocardial infarction) ([place]) 04/14/2012  . NSTEMI (non-ST elevated myocardial infarction) ([place]) 04/14/2012  . Cardiomyopathy, ischemic 04/14/2012  .  Cerebral embolism with cerebral infarction ([place]) 04/03/2012  . Unspecified transient cerebral ischemia 04/02/2012  . Hemiplegia, unspecified, affecting nondominant side 04/02/2012  . CAD (coronary artery disease) s/p stent 2008 in [place] 04/02/2012  . Hypertension     Past Surgical History:  Procedure Laterality Date  . CORONARY ANGIOPLASTY WITH STENT PLACEMENT  03/2012  . HERNIA REPAIR    . LEFT HEART CATHETERIZATION WITH CORONARY ANGIOGRAM N/A 04/11/2012   Procedure: LEFT HEART CATHETERIZATION WITH CORONARY ANGIOGRAM;  Surgeon: Burnell Blanks, MD;  Location: [facility] CATH LAB;  Service: Cardiovascular;  Laterality: N/A;  . PERCUTANEOUS CORONARY INTERVENTION-BALLOON ONLY  04/11/2012   Procedure: PERCUTANEOUS  CORONARY INTERVENTION-BALLOON ONLY;  Surgeon: Burnell Blanks, MD;  Location: [facility] CATH LAB;  Service: Cardiovascular;;  RPLA  . PERCUTANEOUS CORONARY STENT INTERVENTION (PCI-S)  04/11/2012   Procedure: PERCUTANEOUS CORONARY STENT INTERVENTION (PCI-S);  Surgeon: Burnell Blanks, MD;  Location: [facility] CATH LAB;  Service: Cardiovascular;;  Distal RCA       Family History  Problem Relation Age of Onset  . Heart attack Father   . Hypertension Father   . Cancer Mother   . Stroke Sister   . Hypertension Mother   . Hypertension Sister   . Hypertension Brother     Social History   Tobacco Use  . Smoking status: Current Every Day Smoker    Packs/day: 0.50    Years: 40.00    Pack years: 20.00    Types: Cigarettes  . Smokeless tobacco: Never Used  Substance Use Topics  . Alcohol use: Yes    Comment: "bourbon"  . Drug use: No    Types: Benzodiazepines, Marijuana    Home Medications Prior to Admission medications   Medication Sig Start Date End Date Taking? Authorizing Provider  aspirin EC 81 MG tablet Take 81 mg by mouth daily.   Yes [provider]  atorvastatin (LIPITOR) 80 MG tablet Take 1 tablet (80 mg total) by mouth daily at 6 PM. 08/02/18  Yes Imogene Burn, PA-C  carvedilol (COREG) 25 MG tablet Take 25 mg by mouth 2 (two) times daily with a meal.  07/31/18  Yes [provider]  irbesartan (AVAPRO) 150 MG tablet Take 1 tablet (150 mg total) by mouth daily. 08/02/18  Yes Imogene Burn, PA-C  pantoprazole (PROTONIX) 40 MG tablet Take 1 tablet (40 mg total) by mouth daily. 04/04/18  Yes Asencion Noble, MD  spironolactone (ALDACTONE) 25 MG tablet Take 1 tablet (25 mg total) by mouth daily. 09/12/18  Yes Imogene Burn, PA-C  calcium carbonate (TUMS EX) 750 MG chewable tablet Chew 1 tablet (750 mg total) by mouth 3 (three) times daily as needed for heartburn. Patient not taking: Reported on 08/24/2019 04/03/18   Asencion Noble, MD  clindamycin  (CLEOCIN) 150 MG capsule Take 2 capsules (300 mg total) by mouth every 6 (six) hours. Patient not taking: Reported on 08/24/2019 04/14/19   Carlisle Cater, PA-C  HYDROcodone-acetaminophen (NORCO/VICODIN) 5-325 MG tablet Take 1-2 tablets by mouth every 6 (six) hours as needed. Patient not taking: Reported on 08/24/2019 02/06/19   Volanda Napoleon, PA-C  oxycodone (OXY-IR) 5 MG capsule Take 1 capsule (5 mg total) by mouth every 6 (six) hours as needed. Patient not taking: Reported on 08/24/2019 04/14/19   Carlisle Cater, PA-C  thiamine 100 MG tablet Take 1 tablet (100 mg total) by mouth daily. Patient not taking: Reported on 08/24/2019 04/14/16   Bary Leriche, PA-C    Allergies  Lisinopril  Review of Systems   Review of Systems  Cardiovascular: Positive for chest pain.  All other systems reviewed and are negative.   Physical Exam Updated Vital Signs BP 115/74   Pulse (!) 54   Resp 17   Ht _0  (1.6 m)   Wt 59 kg   SpO2 100%   BMI 23.03 kg/m   Physical Exam Vitals and nursing note reviewed.  Constitutional:      Appearance: He is well-developed.  HENT:     Head: Normocephalic and atraumatic.  Cardiovascular:     Rate and Rhythm: Normal rate.  Pulmonary:     Effort: Pulmonary effort is normal. Tachypnea present. No respiratory distress.  Chest:     Chest wall: No mass.  Abdominal:     General: There is no distension.  Musculoskeletal:        General: Normal range of motion.     Cervical back: Normal range of motion.     Right lower leg: No edema.     Left lower leg: No edema.  Skin:    General: Skin is warm and dry.  Neurological:     General: No focal deficit present.     Mental Status: He is alert.     ED Results / Procedures / Treatments   Labs (all labs ordered are listed, but only abnormal results are displayed) Labs Reviewed  BASIC METABOLIC PANEL - Abnormal; Notable for the following components:      Result Value   Glucose, Bld 110 (*)    All other  components within normal limits  CBC - Abnormal; Notable for the following components:   Hemoglobin 12.2 (*)    HCT 38.5 (*)    All other components within normal limits  SARS CORONAVIRUS 2 BY RT PCR (HOSPITAL ORDER, [facility])  HEPATIC FUNCTION PANEL  LIPASE, BLOOD  PROTIME-INR  HIV ANTIBODY (ROUTINE TESTING W REFLEX)  CBC  BASIC METABOLIC PANEL  TROPONIN I (HIGH SENSITIVITY)  TROPONIN I (HIGH SENSITIVITY)    EKG None  Radiology DG Chest 2 View  Result Date: 08/24/2019 CLINICAL DATA:  Nonradiating chest pain EXAM: CHEST - 2 VIEW COMPARISON:  04/02/2018 FINDINGS: Frontal and lateral views of the chest demonstrate a stable cardiac silhouette. There is a left-sided pneumothorax, volume estimated at least 20%. No midline shift or tension effect. Right chest is clear. No pleural effusion. No acute displaced fractures. IMPRESSION: 1. Moderate left pneumothorax, volume estimated at least 20%. No tension effect. These results were called by telephone at the time of interpretation on 08/24/2019 at 3:35 am to provider [facility] , who verbally acknowledged these results. Electronically Signed   By: Randa Ngo M.D.   On: 08/24/2019 03:35    Procedures .Critical Care Performed by: Merrily Pew, MD Authorized by: Merrily Pew, MD   Critical care provider statement:    Critical care time (minutes):  45   Critical care was necessary to treat or prevent imminent or life-threatening deterioration of the following conditions:  Respiratory failure   Critical care was time spent personally by me on the following activities:  Discussions with consultants, evaluation of patient's response to treatment, examination of patient, ordering and performing treatments and interventions, ordering and review of laboratory studies, ordering and review of radiographic studies, pulse oximetry, re-evaluation of patient's condition, obtaining history from patient or surrogate and  review of old charts CHEST TUBE INSERTION  Date/Time: 08/24/2019 7:41 AM Performed by: Merrily Pew, MD  Authorized by: Merrily Pew, MD   Consent:    Consent obtained:  Verbal (sister on phone and patient in room)   Consent given by:  Patient (and sister)   Risks discussed:  Bleeding, incomplete drainage, nerve damage, pain, infection and damage to surrounding structures   Alternatives discussed:  No treatment, delayed treatment, alternative treatment, observation and referral Pre-procedure details:    Skin preparation:  ChloraPrep   Preparation: Patient was prepped and draped in the usual sterile fashion   Anesthesia (see MAR for exact dosages):    Anesthesia method:  Local infiltration   Local anesthetic:  Lidocaine 2% WITH epi Procedure details:    Placement location:  L lateral   Scalpel size:  11   Tube size (Fr):  8   Ultrasound guidance: no     Tension pneumothorax: no     Tube connected to:  Suction   Drainage characteristics:  Air only   Suture material:  0 silk   Dressing:  Petrolatum-impregnated gauze Post-procedure details:    Post-insertion x-ray findings: tube in good position     Patient tolerance of procedure:  Tolerated well, no immediate complications   (including critical care time)  Medications Ordered in ED Medications  sodium chloride flush (NS) 0.9 % injection 3 mL (has no administration in time range)  sodium chloride flush (NS) 0.9 % injection 10 mL (has no administration in time range)  fentaNYL (SUBLIMAZE) injection 50 mcg (has no administration in time range)  enoxaparin (LOVENOX) injection 40 mg (has no administration in time range)  sodium chloride flush (NS) 0.9 % injection 3 mL (has no administration in time range)  0.45 % sodium chloride infusion (has no administration in time range)  traMADol (ULTRAM) tablet 50 mg (has no administration in time range)  oxyCODONE (Oxy IR/ROXICODONE) immediate release tablet 5 mg (has no administration in  time range)  HYDROmorphone (DILAUDID) injection 0.5-1 mg (has no administration in time range)  docusate sodium (COLACE) capsule 100 mg (has no administration in time range)  sorbitol 70 % solution 30 mL (has no administration in time range)  ondansetron (ZOFRAN) tablet 4 mg (has no administration in time range)    Or  ondansetron (ZOFRAN) injection 4 mg (has no administration in time range)  aspirin EC tablet 81 mg (has no administration in time range)  multivitamin with minerals tablet 1 tablet (has no administration in time range)  irbesartan (AVAPRO) tablet 150 mg (has no administration in time range)  carvedilol (COREG) tablet 25 mg (has no administration in time range)  pantoprazole (PROTONIX) EC tablet 40 mg (has no administration in time range)  atorvastatin (LIPITOR) tablet 80 mg (has no administration in time range)  fentaNYL (SUBLIMAZE) injection 50 mcg (50 mcg Intravenous Given 08/24/19 0519)  pantoprazole (PROTONIX) injection 40 mg (40 mg Intravenous Given 08/24/19 0521)  lidocaine-EPINEPHrine (XYLOCAINE W/EPI) 2 %-1:200000 (PF) injection 20 mL (20 mLs Infiltration Given by Other 08/24/19 9191)    ED Course  I have reviewed the triage vital signs and the nursing notes.  Pertinent labs & imaging results that were available during my care of the patient were reviewed by me and considered in my medical decision making (see chart for details).    MDM Rules/Calculators/A&P                      Spontaneous pneumothorax of unknown etiology. No obvious respiratory distress. Discussed with cardiothoracic surgery who recommended chest tube placement and they will met  to hospital. Chest tube placed as per above procedure note without obvious complications. Chest x-ray pending at time of admission.  Final Clinical Impression(s) / ED Diagnoses Final diagnoses:  Primary spontaneous pneumothorax    Rx / DC Orders ED Discharge Orders    None       Leahann Lempke, Corene Cornea, MD 08/24/19  [phone]

## 2019-08-24 NOTE — ED Notes (Signed)
PrimoFit placed on pt

## 2019-08-24 NOTE — ED Notes (Signed)
bfast ordered 

## 2019-08-24 NOTE — Discharge Summary (Addendum)
Physician Discharge Summary       [address]       [name] [address]             [phone]    Patient ID: Randall Patel MRN: [id number] DOB/AGE: 67-Oct-1952 67 y.o.  Admit date: 08/24/2019 Discharge date: 09/11/2019  Admission Diagnoses: Spontaneous left pneumothorax Discharge Diagnoses:  S/p left chest tube History of all of the following: Chest pain 04/02/2018    Anoxic brain injury (HCC) 03/31/2016   Pulseless electrical activity (HCC)     Chronic obstructive pulmonary disease (HCC)     Dysphagia, post-stroke     Abnormal CT of brain     Coronary artery disease involving native coronary artery of native heart without angina pectoris     ETOH abuse     Tachypnea     Benign essential HTN     Hyperglycemia     Pain     Leukocytosis     Acute pulmonary edema (HCC)     Respiratory failure (HCC)     Sinus tachycardia     Cardiac arrest (HCC) 03/23/2016   Palpitations 08/15/2015   History of stroke 08/15/2015   Anterior cerebral circulation hemorrhagic infarction (HCC) 08/15/2015   Hyperlipidemia 08/15/2015   Muscle stiffness     Cough     Hemiparesis affecting left side as late effect of stroke (HCC)     Dysphagia as late effect of cerebrovascular disease     Essential hypertension     Acute blood loss anemia     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) 04/10/2015   Dysphagia     Dysarthria due to cerebrovascular accident     Diastolic dysfunction     Tobacco abuse     ETOH abuse     History of CVA with residual deficit     Coronary artery disease involving native coronary artery of native heart with angina pectoris (HCC)     Tachypnea     Hypernatremia     Hypokalemia     Thrombocytopenia (HCC)     Intracranial hemorrhage (HCC)     Essential hypertension, malignant     ICH (intracerebral hemorrhage) (HCC) 04/04/2015   STEMI (ST elevation myocardial infarction) (HCC) 04/14/2012   NSTEMI (non-ST elevated myocardial  infarction) (HCC) 04/14/2012   Cardiomyopathy, ischemic 04/14/2012   Cerebral embolism with cerebral infarction (HCC) 04/03/2012   Unspecified transient cerebral ischemia 04/02/2012   Hemiplegia, unspecified, affecting nondominant side 04/02/2012   CAD (coronary artery disease) s/p stent 2008 in [place] 04/02/2012   Hypertension     Consults: None  Procedure (s):  1. Placement of 8 French left chest tube by Dr. Clayborne Dana in ED 2. Left robotic-assisted wedge resection of apical blebs x2, apical pleurectomy with pleurodesis and intercostal nerve blocks with Exparel by Dr. Tyrone Sage 09/07/2019.  History of Presenting Illness: This is a 69 year old male who presented to [facility] ED on 05/14 with complaints of chest pain. He was watching television when this pain occurred. He has had a heart attack in the past and this pain did not feel like that. He took Tums and a Nitro then called EMS. Chest x ray showed a moderate left pneumothorax (about 20%). Dr. Clayborne Dana discussed with Dr. Donata Clay who recommended a left chest tube placement. Dr. Clayborne Dana placed an 8 French left chest tube and left lung is mostly re expanded.  Brief Hospital Course:  Left chest tube is to suction  and there is a mild air leak. Chest x ray done after chest tube placement showed  A small, left apical pneumothorax (decreased from admission x ray).  This remained stable for 48 hours.  His air leak resolved and was transitioned to water seal on 5/17.  Follow up CXR showed stable appearance of left pneumothorax.  He unfortunately developed a small air leak.  This persisted and ultimately his left sided pneumothorax became slightly increased.  He underwent bedside Talc Pleurodesis for this.  CT scan of the chest was obtained and showed pneumothorax and ground glass disease.  No bullae were present. His air leak resolved on 09/03/2019.   CXR showed stable appearance of left sided pneumothorax.  He developed bradycardia during his stay.  His Coreg  dose was decreased accordingly.  The patient developed acute swelling of his lower lip after eating lunch.  The patient stated this had happened previously at home.  He was not on an ACE inhibitor or any new medications.  He was treated with Benadryl and Pepcid.  He was encouraged to make note of when this occurs and what food he had eaten.  The patient developed severe abdominal pain, with hypoactive BS.  Abdominal film was obtained and showed a large burden of stool.  There was no evidence of ileus present.  He was treated with enema per his request with relief of symptoms.  There again was no air leak from the chest tube on 05/25. Chest x ray on 05/26 showed a stable, medial left sided pneumothorax.  and the chest tube was removed. PA/LAT CXR on 05/27 then showed left basilar pneumothorax. Patient was monitored and CXR 05/28  Showed persistent left apical and basilar pneumothorax, subsegmental atelectasis on the right. Dr. Donata Clay discussed with the patient the need for left VATS. Patient underwent a left VATS, wedge resection of apical blebs, apical pleurectomy with pleurodesis and Exparel intercostal nerve block by Dr. Tyrone Sage on 09/07/2019. There was no air leak from chest tube and it was removed on 09/09/2019. Chest x ray on 09/10/2019 showed a small left apical pneumothorax and bibasilar atelectasis. PA/LAT CXR done 06/01 showed no pneumothorax.  Latest Vital Signs: Blood pressure 132/86, pulse 76, temperature 98.7 F (37.1 C), temperature source Oral, resp. rate (!) 22, height 5\' 3"  (1.6 m), weight 59 kg, SpO2 100 %.  Physical Exam: Cardiovascular: RRR Pulmonary: Slightly diminished bibasilar and left apeical breath sounds Abdomen: Soft, non tender, bowel sounds present. Extremities: No lower extremity edema. Wounds: Clean and dry.  No erythema or signs of infection.  Discharge Condition: Stable and discharged to home.  Recent laboratory studies:  Lab Results  Component Value Date    WBC 9.7 09/09/2019   HGB 10.3 (L) 09/09/2019   HCT 32.1 (L) 09/09/2019   MCV 84.9 09/09/2019   PLT 253 09/09/2019   Lab Results  Component Value Date   NA 135 09/09/2019   K 4.0 09/09/2019   CL 100 09/09/2019   CO2 25 09/09/2019   CREATININE 0.87 09/09/2019   GLUCOSE 98 09/09/2019      Diagnostic Studies: DG Chest 1 View  Result Date: 09/10/2019 CLINICAL DATA:  Follow-up pneumothorax EXAM: CHEST  1 VIEW COMPARISON:  Chest radiograph from one day prior. FINDINGS: Interval removal of left chest tube. Stable cardiomediastinal silhouette with top-normal heart size. Surgical sutures overlie the left lung apex. Small left apical pneumothorax is not substantially changed. No right pneumothorax. No right pleural effusion. Stable mild blunting of the left costophrenic  angle. No overt pulmonary edema. Stable bibasilar atelectasis. IMPRESSION: 1. Stable small left apical pneumothorax status post left chest tube removal. 2. Stable bibasilar atelectasis. Electronically Signed   By: Ilona Sorrel M.D.   On: 09/10/2019 07:31   DG Chest 1 View  Result Date: 09/09/2019 CLINICAL DATA:  S/p LEFT pleurectomy and pleurodesis. EXAM: CHEST  1 VIEW COMPARISON:  09/08/2019 FINDINGS: Cardiomediastinal silhouette is unchanged. A LEFT thoracostomy tube is again identified with tiny LEFT apical pneumothorax. Bibasilar atelectasis again noted. No other interval change noted. IMPRESSION: Tiny LEFT apical pneumothorax without other significant change. Bibasilar atelectasis. Electronically Signed   By: Margarette Canada M.D.   On: 09/09/2019 11:41   DG Chest 2 View  Result Date: 09/11/2019 CLINICAL DATA:  Pneumothorax follow-up EXAM: CHEST - 2 VIEW COMPARISON:  09/10/2019 FINDINGS: Suture material is present at the left lung apex. No definite pneumothorax. Improved aeration at the left lung base. Stable band like atelectasis/scarring of the lower right lung. Probable patchy left mid lung atelectasis. Trace left pleural effusion.  Stable cardiomediastinal contours. IMPRESSION: No definite pneumothorax. Improved aeration at the left lung base. Probable patchy left mid lung atelectasis. Stable bandlike atelectasis/scarring of right lower lung Electronically Signed   By: Macy Mis M.D.   On: 09/11/2019 09:51   DG Chest 2 View  Result Date: 09/07/2019 CLINICAL DATA:  Chest tube placement EXAM: CHEST - 2 VIEW COMPARISON:  09/06/2019 FINDINGS: Persistent left pneumothorax, similar to prior with associated pleural effusion. Retrocardiac collapse/consolidative opacity stable. There is some subsegmental atelectasis in the parahilar right lung. The cardiopericardial silhouette is within normal limits for size. The visualized bony structures of the thorax are intact. Telemetry leads overlie the chest. IMPRESSION: 1. No substantial change left hydropneumothorax. 2. Stable retrocardiac collapse/consolidative opacity with right parahilar atelectasis. Electronically Signed   By: Misty Stanley M.D.   On: 09/07/2019 08:57   DG Chest 2 View  Result Date: 09/06/2019 CLINICAL DATA:  Pneumothorax, chest tube removed yesterday EXAM: CHEST - 2 VIEW COMPARISON:  Portable exam 8546 hours compared to 09/05/2019 FINDINGS: Interval removal of LEFT thoracostomy tube with LEFT basilar hydropneumothorax. No mediastinal shift. Atelectasis at RIGHT base. Heart size normal. Bowel interposition between liver and diaphragm. IMPRESSION: Persistent LEFT basilar hydropneumothorax following chest tube removal. Findings called to D. Zimmerman PA on 09/06/2019 at 0848 hours. Electronically Signed   By: Lavonia Dana M.D.   On: 09/06/2019 08:49   DG Chest 2 View  Result Date: 08/24/2019 CLINICAL DATA:  Nonradiating chest pain EXAM: CHEST - 2 VIEW COMPARISON:  04/02/2018 FINDINGS: Frontal and lateral views of the chest demonstrate a stable cardiac silhouette. There is a left-sided pneumothorax, volume estimated at least 20%. No midline shift or tension effect. Right  chest is clear. No pleural effusion. No acute displaced fractures. IMPRESSION: 1. Moderate left pneumothorax, volume estimated at least 20%. No tension effect. These results were called by telephone at the time of interpretation on 08/24/2019 at 3:35 am to provider [facility] , who verbally acknowledged these results. Electronically Signed   By: Randa Ngo M.D.   On: 08/24/2019 03:35   CT CHEST W CONTRAST  Result Date: 08/30/2019 CLINICAL DATA:  Left pneumothorax. EXAM: CT CHEST WITH CONTRAST TECHNIQUE: Multidetector CT imaging of the chest was performed during intravenous contrast administration. CONTRAST:  108mL OMNIPAQUE IOHEXOL 300 MG/ML  SOLN COMPARISON:  Chest x-ray 08/30/2019 FINDINGS: Cardiovascular: Heart size upper normal. Coronary artery calcification is evident. Atherosclerotic calcification is noted in the wall of the  thoracic aorta. Mediastinum/Nodes: No mediastinal lymphadenopathy. There is no hilar lymphadenopathy. The esophagus has normal imaging features. There is no axillary lymphadenopathy. Lungs/Pleura: Small apical and anterior left pneumothorax noted with pleural drain seen anteriorly in the pleural space of the left upper hemithorax. subsegmental atelectasis noted right lower lobe. There is atelectasis in ground-glass airspace disease in the left lower lobe. Small left pleural effusion associated with high attenuation material in the posterior left pleural space. Upper Abdomen: 2.1 cm water density lesion interpolar left kidney has been incompletely visualized but is compatible with a cyst. Musculoskeletal: No worrisome lytic or sclerotic osseous abnormality. IMPRESSION: 1. Small left apical and anterior left pneumothorax with pleural drain seen anteriorly in the pleural space of the left upper hemithorax. 2. Small left pleural effusion associated with high attenuation material in the posterior left pleural space. 3. Left lower lobe atelectasis with ground-glass airspace disease.  Features likely related to atelectasis although component of inflammation/infection not excluded. 4. Aortic Atherosclerosis (ICD10-I70.0). Electronically Signed   By: Kennith Center M.D.   On: 08/30/2019 14:05   DG Chest Port 1 View  Result Date: 09/08/2019 CLINICAL DATA:  Status post pleurodesis EXAM: PORTABLE CHEST 1 VIEW COMPARISON:  09/07/2019 FINDINGS: Cardiac shadow is stable. Aortic calcifications are again seen. Left thoracostomy tube is noted in place without evidence of pneumothorax. Left apical surgical changes are seen. Patchy opacities are noted in the left lung consistent with atelectasis. Previously seen pneumomediastinum has resolved. IMPRESSION: Tubes and lines as described above. Mild left basilar atelectasis. Electronically Signed   By: Alcide Clever M.D.   On: 09/08/2019 09:24   DG Chest Port 1 View  Result Date: 09/07/2019 CLINICAL DATA:  Postop wedge resection left apical blebs EXAM: PORTABLE CHEST 1 VIEW COMPARISON:  09/07/2019, CT 08/30/2019 FINDINGS: Right lung is grossly clear. Postsurgical changes now visible at left apex. Insertion of left chest tube with tip at the left apex. Decreased left hydropneumothorax with small left apical and probable basilar pneumothorax. Probable small amount of pneumomediastinum. Normal heart size. IMPRESSION: 1. Interval postsurgical change at the left apex with insertion of left chest tube. Decreased left hydropneumothorax with small residual left apical pneumothorax and possible small component of pneumothorax at the left cardio phrenic sulcus. 2. Possible small amount of pneumomediastinum Electronically Signed   By: Jasmine Pang M.D.   On: 09/07/2019 19:42   DG Chest Port 1 View  Result Date: 09/05/2019 CLINICAL DATA:  Chest tube placement.  Recent pneumothorax EXAM: PORTABLE CHEST 1 VIEW COMPARISON:  Sep 04, 2019 FINDINGS: Chest tube position on the left is unchanged. There is a left pleural effusion, stable. There is questionable pneumothorax  on the left medially, stable. Consolidation noted in the medial left base. There is atelectatic change in the right lower lung region. No new opacity evident. Heart size and pulmonary vascularity are normal. No adenopathy. No bone lesions. IMPRESSION: No change in chest tube position. Suspect a degree of medial pneumothorax on the left, stable. Stable pleural effusion which appears somewhat loculated on the left. Medial left base consolidation may be due to atelectasis, although a degree of superimposed pneumonia in the left base cannot be excluded. Atelectatic change noted in the right lower lung region. No evident new opacity. Stable cardiac silhouette. Electronically Signed   By: Bretta Bang III M.D.   On: 09/05/2019 09:31   DG CHEST PORT 1 VIEW  Result Date: 09/04/2019 CLINICAL DATA:  Chest tube. Chest pain. EXAM: PORTABLE CHEST 1 VIEW COMPARISON:  One-view  chest x-ray 09/03/2019. FINDINGS: The heart size is normal. Left-sided chest tube is stable. No definite residual pneumothorax is present. Left pleural effusion is increased slightly. Bibasilar airspace disease likely reflects atelectasis. IMPRESSION: 1. Slight increase in left pleural effusion. 2. Stable left-sided chest tube without definite pneumothorax. Electronically Signed   By: Marin Roberts M.D.   On: 09/04/2019 08:43   DG CHEST PORT 1 VIEW  Result Date: 09/03/2019 CLINICAL DATA:  Left-sided chest pain.  Assess chest tube position. EXAM: PORTABLE CHEST 1 VIEW COMPARISON:  Chest x-ray from yesterday. FINDINGS: Left-sided chest tube may have been retracted slightly compared to yesterday's study, with the tube remains well-positioned. Decreasing now trace left pneumothorax. Several new linear densities overlying the left mid and lower hemithorax are favored external to the patient, as lung markings are seen extending beyond the margins. Decreasing small left pleural effusion. Unchanged left greater than right lower lobe atelectasis.  Stable cardiomediastinal silhouette. Normal pulmonary vascularity. No acute osseous abnormality. IMPRESSION: 1. Slight interval retraction of the left-sided chest tube when compared to yesterday's study, although the tube remains well positioned. 2. Decreasing now trace left pneumothorax. Decreasing small left pleural effusion. Electronically Signed   By: Obie Dredge M.D.   On: 09/03/2019 13:00   DG CHEST PORT 1 VIEW  Result Date: 09/02/2019 CLINICAL DATA:  Follow-up pneumothorax EXAM: PORTABLE CHEST 1 VIEW COMPARISON:  09/01/2010 FINDINGS: Pigtail catheter is again noted on the left with small pneumothorax relatively stable from the prior exam. Stable bibasilar opacity is noted left greater than right small left pleural effusion is again noted. IMPRESSION: Stable appearance of the chest when compared with the prior day. Electronically Signed   By: Alcide Clever M.D.   On: 09/02/2019 08:03   DG Chest Port 1 View  Result Date: 09/01/2019 CLINICAL DATA:  Follow-up chest tube EXAM: PORTABLE CHEST 1 VIEW COMPARISON:  08/31/2018 FINDINGS: Cardiac shadow is stable. Left-sided pigtail catheter is again seen with small pneumothorax slightly larger than that seen on the prior exam. Small left-sided pleural effusion is noted. Mild right basilar atelectasis is seen. No bony abnormality is noted. IMPRESSION: Slight increase in left-sided pneumothorax. Right basilar atelectasis. Electronically Signed   By: Alcide Clever M.D.   On: 09/01/2019 09:01   DG CHEST PORT 1 VIEW  Result Date: 08/31/2019 CLINICAL DATA:  Follow-up pneumothorax. EXAM: PORTABLE CHEST 1 VIEW COMPARISON:  08/30/2019 FINDINGS: The cardiac silhouette, mediastinal and hilar contours are stable. The left-sided chest tube is stable. There is a small residual apical pneumothorax, decreased in size when compared to the prior study. Persistent bibasilar atelectasis and small left effusion. IMPRESSION: 1. Decrease in size of left apical pneumothorax. 2.  Persistent bibasilar atelectasis and small left effusion. Electronically Signed   By: Rudie Meyer M.D.   On: 08/31/2019 07:07   DG CHEST PORT 1 VIEW  Result Date: 08/30/2019 CLINICAL DATA:  Left pneumothorax. EXAM: PORTABLE CHEST 1 VIEW COMPARISON:  Aug 29, 2019. FINDINGS: Stable cardiomediastinal silhouette. Atherosclerosis of thoracic aorta is noted. Left-sided chest tube is again noted. Mild left apical and basilar pneumothorax is noted which is mildly enlarged compared to prior exam. Stable right midlung subsegmental atelectasis is noted. Bony thorax is unremarkable. IMPRESSION: Aortic atherosclerosis. Left-sided chest tube is again noted. Mild left apical and basilar pneumothorax is noted which is mildly enlarged compared to prior exam. Stable right midlung subsegmental atelectasis. Aortic Atherosclerosis (ICD10-I70.0). Electronically Signed   By: Lupita Raider M.D.   On: 08/30/2019 09:03  DG CHEST PORT 1 VIEW  Result Date: 08/29/2019 CLINICAL DATA:  Left-sided pneumothorax.  Chest tube. EXAM: PORTABLE CHEST 1 VIEW COMPARISON:  08/28/2019 FINDINGS: Left apical chest tube unchanged. Lungs are adequately inflated with stable small left apical pneumothorax. Stable linear atelectasis over the right perihilar region. Remainder of the lungs are clear. Cardiomediastinal silhouette and remainder of the exam is unchanged. IMPRESSION: Small stable left apical pneumothorax. Left-sided chest tube unchanged. Stable right perihilar linear atelectasis. Electronically Signed   By: Elberta Fortis M.D.   On: 08/29/2019 08:03   DG CHEST PORT 1 VIEW  Result Date: 08/28/2019 CLINICAL DATA:  Follow-up chest tube EXAM: PORTABLE CHEST 1 VIEW COMPARISON:  08/27/2019 FINDINGS: Pigtail catheter is again noted on the left. Left apical pneumothorax is again seen and stable. The lungs are well aerated bilaterally. Basilar atelectasis on the right is again seen. No new focal abnormality is noted. IMPRESSION: Stable left  apical pneumothorax. Electronically Signed   By: Alcide Clever M.D.   On: 08/28/2019 08:29   DG CHEST PORT 1 VIEW  Result Date: 08/27/2019 CLINICAL DATA:  Left chest tube, left pneumothorax EXAM: PORTABLE CHEST 1 VIEW COMPARISON:  08/26/2019 FINDINGS: Small left apical pneumothorax, unchanged. Indwelling left chest drain. Right lung is essentially clear, noting mild right infrahilar atelectasis. The heart is normal in size. IMPRESSION: Stable small left apical pneumothorax with indwelling left chest drain. Electronically Signed   By: Charline Bills M.D.   On: 08/27/2019 08:12   DG CHEST PORT 1 VIEW  Result Date: 08/26/2019 CLINICAL DATA:  Pneumothorax. EXAM: PORTABLE CHEST 1 VIEW COMPARISON:  Chest x-ray dated 08/25/2019. FINDINGS: LEFT-sided chest tube is stable in position. There is a persistent small pneumothorax at the LEFT lung apex, not significantly changed compared to yesterday's exam. Probable mild atelectasis at the RIGHT lung base. Lungs otherwise clear. No pleural effusion is seen. Heart size and mediastinal contours are stable. IMPRESSION: 1. Persistent small pneumothorax at the LEFT lung apex, not significantly changed compared to yesterday's exam. Stable chest tube position. 2. Probable mild atelectasis at the RIGHT lung base. Lungs otherwise clear. Electronically Signed   By: Bary Richard M.D.   On: 08/26/2019 09:43   Portable chest 1 View  Result Date: 08/25/2019 CLINICAL DATA:  LEFT pneumothorax. EXAM: PORTABLE CHEST 1 VIEW COMPARISON:  Chest x-ray dated 08/24/2019. FINDINGS: LEFT-sided chest tube in place. Tiny persistent pneumothorax at the LEFT lung apex, slightly decreased in size compared to yesterday's chest x-ray. New curvilinear opacity at the RIGHT lung base, presumably atelectasis or small amount of fissural fluid. Lungs otherwise clear. Heart size and mediastinal contours are stable. IMPRESSION: 1. Tiny persistent pneumothorax at the LEFT lung apex, slightly decreased in  size compared to yesterday's chest x-ray. LEFT-sided chest tube in place. 2. Probable atelectasis at the RIGHT lung base. Electronically Signed   By: Bary Richard M.D.   On: 08/25/2019 10:08   DG Chest Portable 1 View  Result Date: 08/24/2019 CLINICAL DATA:  Left chest tube placement. EXAM: PORTABLE CHEST 1 VIEW COMPARISON:  Chest x-ray from same day at 0320 hours. FINDINGS: New left-sided chest tube with residual small left apical pneumothorax. The lungs are clear. No pleural effusion. The heart size and mediastinal contours are within normal limits. No acute osseous abnormality. IMPRESSION: 1. Improved left pneumothorax status post chest tube placement. Electronically Signed   By: Obie Dredge M.D.   On: 08/24/2019 08:20   DG Abd Portable 1V  Result Date: 08/31/2019 CLINICAL DATA:  Abdominal  pain question constipation EXAM: PORTABLE ABDOMEN - 1 VIEW COMPARISON:  Portable exam 0804 hours compared to 10/30/2017 FINDINGS: Scattered stool throughout colon to rectum. Air-filled loops of small bowel throughout abdomen. No evidence of bowel obstruction or bowel wall thickening. Bones demineralized. Mildly distended urinary bladder containing excreted contrast material. IMPRESSION: Nonobstructive bowel gas pattern. Scattered stool throughout colon to rectum. Electronically Signed   By: Ulyses Southward M.D.   On: 08/31/2019 08:30      Discharge Medications: Allergies as of 09/11/2019       Reactions   Lisinopril Swelling, Other (See Comments)   angioedema        Medication List     STOP taking these medications    calcium carbonate 750 MG chewable tablet Commonly known as: TUMS EX   clindamycin 150 MG capsule Commonly known as: CLEOCIN   HYDROcodone-acetaminophen 5-325 MG tablet Commonly known as: NORCO/VICODIN   irbesartan 150 MG tablet Commonly known as: AVAPRO   oxycodone 5 MG capsule Commonly known as: OXY-IR   spironolactone 25 MG tablet Commonly known as: ALDACTONE        TAKE these medications    acetaminophen 325 MG tablet Commonly known as: TYLENOL Take 2 tablets (650 mg total) by mouth every 6 (six) hours as needed for mild pain, fever or headache.   aspirin EC 81 MG tablet Take 81 mg by mouth daily.   atorvastatin 80 MG tablet Commonly known as: LIPITOR Take 1 tablet (80 mg total) by mouth daily at 6 PM.   carvedilol 3.125 MG tablet Commonly known as: COREG Take 1 tablet (3.125 mg total) by mouth 2 (two) times daily with a meal. What changed:  medication strength how much to take   multivitamin with minerals Tabs tablet Take 1 tablet by mouth daily.   pantoprazole 40 MG tablet Commonly known as: PROTONIX Take 1 tablet (40 mg total) by mouth daily.   thiamine 100 MG tablet Take 1 tablet (100 mg total) by mouth daily.   traMADol 50 MG tablet Commonly known as: ULTRAM Take 1 tablet (50 mg total) by mouth every 8 (eight) hours as needed (mild pain).        Follow Up Appointments: Follow-up Information     Donata Clay, Theron Arista, MD. Go on 09/19/2019.   Specialty: Cardiothoracic Surgery Why:  Please obtain a PA/LAT CXR at [facility] (on the ground floor of Dr. Zenaida Niece Trigt's office) on 05/26 at 3:00 pm ;Appointment time is at 3:30 pm Contact information: [address] [place] [address] [phone]         Kathleene Hazel, MD. Go on 10/10/2019.   Specialty: Cardiology Why: Appointment time is at 3:40 pm Contact information: [address] [place] [address] [phone]            Signed: Lelon Huh [facility] 09/12/2019, 8:32 AM  DC instructions reviewed with patient

## 2019-08-24 NOTE — Plan of Care (Signed)
  Problem: Education: Goal: Knowledge of General Education information will improve Description: Including pain rating scale, medication(s)/side effects and non-pharmacologic comfort measures Outcome: Progressing   Problem: Clinical Measurements: Goal: Diagnostic test results will improve Outcome: Progressing   Problem: Elimination: Goal: Will not experience complications related to bowel motility Outcome: Progressing   

## 2019-08-24 NOTE — Progress Notes (Signed)
  Subjective: 25% L spontaneous pntx- cxr improved after pigtail placed Mild air leak Active smoker Hx HTN, CVA, PCI  Objective: Vital signs in last 24 hours: Pulse Rate:  [51-72] 54 (05/14 0730) Cardiac Rhythm: Normal sinus rhythm (05/14 0257) Resp:  [16-24] 17 (05/14 0730) BP: (99-125)/(74-85) 115/74 (05/14 0730) SpO2:  [94 %-100 %] 100 % (05/14 0730) Weight:  [59 kg] 59 kg (05/14 0240)  Hemodynamic parameters for last 24 hours:  stable  Intake/Output from previous day: No intake/output data recorded. Intake/Output this shift: No intake/output data recorded.  EXAM Breath sounds equal Neuro intact Abdomen soft Lab Results: Recent Labs    08/24/19 0253  WBC 4.7  HGB 12.2*  HCT 38.5*  PLT 181   BMET:  Recent Labs    08/24/19 0253  NA 135  K 4.3  CL 102  CO2 25  GLUCOSE 110*  BUN 17  CREATININE 0.93  CALCIUM 9.6    PT/INR: No results for input(s): LABPROT, INR in the last 72 hours. ABG    Component Value Date/Time   PHART 7.392 03/27/2016 0430   HCO3 25.0 03/27/2016 0430   TCO2 25 10/26/2016 2025   ACIDBASEDEF 2.7 (H) 03/25/2016 0340   O2SAT 97.0 03/27/2016 0430   CBG (last 3)  No results for input(s): GLUCAP in the last 72 hours.  Assessment/Plan: S/P  Plan  Admit forchest tube therapy of 1st spontaneous PNTX   LOS: 0 days    [name] 08/24/2019

## 2019-08-25 ENCOUNTER — Inpatient Hospital Stay (HOSPITAL_COMMUNITY): Payer: Medicare HMO

## 2019-08-25 NOTE — Plan of Care (Signed)
  Problem: Education: Goal: Knowledge of General Education information will improve Description: Including pain rating scale, medication(s)/side effects and non-pharmacologic comfort measures Outcome: Progressing   Problem: Health Behavior/Discharge Planning: Goal: Ability to manage health-related needs will improve Outcome: Progressing   Problem: Clinical Measurements: Goal: Will remain free from infection Outcome: Progressing   Problem: Nutrition: Goal: Adequate nutrition will be maintained Outcome: Progressing   Problem: Coping: Goal: Level of anxiety will decrease Outcome: Progressing   Problem: Pain Managment: Goal: General experience of comfort will improve Outcome: Progressing   

## 2019-08-25 NOTE — Progress Notes (Addendum)
      [address]       [name] [address]             [phone]      Subjective:  Patient with some mild pain.  States he is just trying to be still.    Objective: Vital signs in last 24 hours: Temp:  [97.7 F (36.5 C)-98.5 F (36.9 C)] 97.9 F (36.6 C) (05/15 0820) Pulse Rate:  [45-70] 68 (05/15 0820) Cardiac Rhythm: Sinus bradycardia (05/15 0717) Resp:  [14-22] 17 (05/15 0820) BP: (97-152)/(70-119) 97/72 (05/15 0820) SpO2:  [100 %] 100 % (05/15 0820)  Intake/Output from previous day: 05/14 0701 - 05/15 0700 In: 20  Out: 1016 [Urine:1000; Chest Tube:16]  General appearance: alert, cooperative and no distress Heart: regular rate and rhythm Lungs: clear to auscultation bilaterally Abdomen: soft, non-tender; bowel sounds normal; no masses,  no organomegaly Extremities: extremities normal, atraumatic, no cyanosis or edema Wound: clean and dry  Lab Results: Recent Labs    08/24/19 0253 08/24/19 0834  WBC 4.7 5.6  HGB 12.2* 13.0  HCT 38.5* 41.7  PLT 181 202   BMET:  Recent Labs    08/24/19 0253 08/24/19 0834  NA 135 134*  K 4.3 4.5  CL 102 98  CO2 25 26  GLUCOSE 110* 138*  BUN 17 15  CREATININE 0.93 0.96  CALCIUM 9.6 10.1    PT/INR:  Recent Labs    08/24/19 0834  LABPROT 13.0  INR 1.0   ABG    Component Value Date/Time   PHART 7.392 03/27/2016 0430   HCO3 25.0 03/27/2016 0430   TCO2 25 10/26/2016 2025   ACIDBASEDEF 2.7 (H) 03/25/2016 0340   O2SAT 97.0 03/27/2016 0430   CBG (last 3)  Recent Labs    08/24/19 1338  GLUCAP 125*    Assessment/Plan:  1. CV- hemodynamically stable on home Coreg, Avapro 2. Pulm- CT in place, initially there was an air leak when speaking, however this wasn't present with coughing, will leave chest tube on suction today.... CXR shows stable left pneumothorax 3. Lovenox for DVT prophylaxis 4. Dispo- patient stable, leave chest tube to suction today, CXR is stable, repeat in AM   LOS: 1 day     Lowella Dandy, PA-C  08/25/2019   I have seen and examined the patient and agree with the assessment and plan as outlined.  Purcell Nails, MD 08/25/2019 3:45 PM

## 2019-08-26 ENCOUNTER — Inpatient Hospital Stay (HOSPITAL_COMMUNITY): Payer: Medicare HMO

## 2019-08-26 MED ORDER — GUAIFENESIN-DM 100-10 MG/5ML PO SYRP
5.0000 mL | ORAL_SOLUTION | ORAL | Status: DC | PRN
  Administered 2019-08-26 – 2019-09-05 (×4): 5 mL via ORAL
  Filled 2019-08-26 (×4): qty 5

## 2019-08-26 MED ORDER — CARVEDILOL 12.5 MG PO TABS
12.5000 mg | ORAL_TABLET | Freq: Two times a day (BID) | ORAL | Status: DC
  Administered 2019-08-26 – 2019-08-30 (×9): 12.5 mg via ORAL
  Filled 2019-08-26 (×10): qty 1

## 2019-08-26 NOTE — Plan of Care (Signed)

## 2019-08-26 NOTE — Progress Notes (Addendum)
      [address]       [name] [address]             [phone]    Subjective:  Pain is better this morning  Objective: Vital signs in last 24 hours: Temp:  [97.9 F (36.6 C)-98.2 F (36.8 C)] 98.2 F (36.8 C) (05/16 0419) Pulse Rate:  [50-68] 57 (05/16 0419) Cardiac Rhythm: Normal sinus rhythm (05/16 0711) Resp:  [14-17] 16 (05/16 0419) BP: (91-113)/(66-78) 111/78 (05/16 0419) SpO2:  [98 %-100 %] 98 % (05/16 0419)  Intake/Output from previous day: 05/15 0701 - 05/16 0700 In: 750 [P.O.:740] Out: 370 [Urine:350; Chest Tube:20]  General appearance: alert, cooperative and no distress Heart: regular rate and rhythm Lungs: clear to auscultation bilaterally Abdomen: soft, non-tender; bowel sounds normal; no masses,  no organomegaly Extremities: extremities normal, atraumatic, no cyanosis or edema Wound: clean and dry  Lab Results: Recent Labs    08/24/19 0253 08/24/19 0834  WBC 4.7 5.6  HGB 12.2* 13.0  HCT 38.5* 41.7  PLT 181 202   BMET:  Recent Labs    08/24/19 0253 08/24/19 0834  NA 135 134*  K 4.3 4.5  CL 102 98  CO2 25 26  GLUCOSE 110* 138*  BUN 17 15  CREATININE 0.93 0.96  CALCIUM 9.6 10.1    PT/INR:  Recent Labs    08/24/19 0834  LABPROT 13.0  INR 1.0   ABG    Component Value Date/Time   PHART 7.392 03/27/2016 0430   HCO3 25.0 03/27/2016 0430   TCO2 25 10/26/2016 2025   ACIDBASEDEF 2.7 (H) 03/25/2016 0340   O2SAT 97.0 03/27/2016 0430   CBG (last 3)  Recent Labs    08/24/19 1338  GLUCAP 125*    Assessment/Plan:  1. Sinus Bradycardia- will decrease Coreg to 12.5 mg BID, continue Avapro 2. Pulm- CT with no air leak, CXR shows stable appearance of leftpneumothorax, leave chest tube on suction today 3. Lovenox for DVT prophylaxis 4. Dispo- patient with bradycardia, will decrease Coreg, CT is w/o air leak will leave on suction today, can possibly transition to water seal tomorrow, repeat CXR in AM   LOS: 2 days     Lowella Dandy, PA-C  08/26/2019   I have seen and examined the patient and agree with the assessment and plan as outlined.  Purcell Nails, MD 08/26/2019 11:38 AM

## 2019-08-26 NOTE — Plan of Care (Signed)
  Problem: Education: Goal: Knowledge of General Education information will improve Description: Including pain rating scale, medication(s)/side effects and non-pharmacologic comfort measures Outcome: Progressing   Problem: Clinical Measurements: Goal: Will remain free from infection Outcome: Progressing Goal: Diagnostic test results will improve Outcome: Progressing   Problem: Activity: Goal: Risk for activity intolerance will decrease Outcome: Progressing   Problem: Nutrition: Goal: Adequate nutrition will be maintained Outcome: Progressing   Problem: Pain Managment: Goal: General experience of comfort will improve Outcome: Progressing   

## 2019-08-27 ENCOUNTER — Inpatient Hospital Stay (HOSPITAL_COMMUNITY): Payer: Medicare HMO

## 2019-08-27 LAB — BASIC METABOLIC PANEL
Anion gap: 8 (ref 5–15)
BUN: 20 mg/dL (ref 8–23)
CO2: 24 mmol/L (ref 22–32)
Calcium: 8.9 mg/dL (ref 8.9–10.3)
Chloride: 97 mmol/L — ABNORMAL LOW (ref 98–111)
Creatinine, Ser: 1.07 mg/dL (ref 0.61–1.24)
GFR calc Af Amer: >60 mL/min (ref >60)
GFR calc non Af Amer: >60 mL/min (ref >60)
Glucose, Bld: 102 mg/dL — ABNORMAL HIGH (ref 70–99)
Potassium: 4.1 mmol/L (ref 3.5–5.1)
Sodium: 129 mmol/L — ABNORMAL LOW (ref 135–145)

## 2019-08-27 MED ORDER — LACTULOSE 10 GM/15ML PO SOLN
20.0000 g | Freq: Every day | ORAL | Status: DC | PRN
  Administered 2019-08-27 – 2019-08-30 (×2): 20 g via ORAL
  Filled 2019-08-27 (×2): qty 30

## 2019-08-27 NOTE — Plan of Care (Signed)
  Problem: Health Behavior/Discharge Planning: Goal: Ability to manage health-related needs will improve Outcome: Progressing   Problem: Clinical Measurements: Goal: Ability to maintain clinical measurements within normal limits will improve Outcome: Progressing Goal: Will remain free from infection Outcome: Progressing   

## 2019-08-27 NOTE — Plan of Care (Signed)

## 2019-08-27 NOTE — Progress Notes (Addendum)
      [address]       [place] [address]             [phone]      Subjective:  No new complaints.  Wants to sit up and eat some breakfast, but knows it will be painful when he gets up.  No BM  Objective: Vital signs in last 24 hours: Temp:  [98 F (36.7 C)-98.5 F (36.9 C)] 98 F (36.7 C) (05/17 0300) Pulse Rate:  [60-133] 133 (05/17 0300) Cardiac Rhythm: Normal sinus rhythm (05/17 0704) Resp:  [15-20] 15 (05/17 0300) BP: (91-112)/(62-69) 108/62 (05/17 0300) SpO2:  [95 %-100 %] 98 % (05/17 0300)  Intake/Output from previous day: 05/16 0701 - 05/17 0700 In: 20 [I.V.:20] Out: 18 [Chest Tube:18]  General appearance: alert, cooperative and no distress Heart: regular rate and rhythm Lungs: clear to auscultation bilaterally Abdomen: soft, non-tender; bowel sounds normal; no masses,  no organomegaly Extremities: extremities normal, atraumatic, no cyanosis or edema Wound: clean and dry  Lab Results: Recent Labs    08/24/19 0834  WBC 5.6  HGB 13.0  HCT 41.7  PLT 202   BMET:  Recent Labs    08/24/19 0834 08/27/19 0205  NA 134* 129*  K 4.5 4.1  CL 98 97*  CO2 26 24  GLUCOSE 138* 102*  BUN 15 20  CREATININE 0.96 1.07  CALCIUM 10.1 8.9    PT/INR:  Recent Labs    08/24/19 0834  LABPROT 13.0  INR 1.0   ABG    Component Value Date/Time   PHART 7.392 03/27/2016 0430   HCO3 25.0 03/27/2016 0430   TCO2 25 10/26/2016 2025   ACIDBASEDEF 2.7 (H) 03/25/2016 0340   O2SAT 97.0 03/27/2016 0430   CBG (last 3)  Recent Labs    08/24/19 1338  GLUCAP 125*    Assessment/Plan:  1. CV- mainly Sinus Brady, BP okay- continue Coreg at reduced dose, Avapro 2. Pulm- no acute issues, CT is w/o air leak on suction,  CXR has remained stable with left side pneumothorax that is small..will try chest tube to water seal today 3. GI-on stool softener, No BM yet, lactulose prn for constipation 4. Lovenox for DVT prophylaxis 5. Dispo- patient stable, has  been from from air leak > 24 hours, pneumothorax has been stable, will try chest tube to water seal today.   LOS: 3 days    Lowella Dandy, PA-C  08/27/2019  spontaneous L pntx improving with chest tube drainge- agree with above assessment and plan  P Elenna Spratling

## 2019-08-28 ENCOUNTER — Inpatient Hospital Stay (HOSPITAL_COMMUNITY): Payer: Medicare HMO

## 2019-08-28 MED ORDER — FENTANYL CITRATE (PF) 100 MCG/2ML IJ SOLN: 25.0000 ug | Freq: Three times a day (TID) | INTRAMUSCULAR | Status: DC | PRN

## 2019-08-28 MED ORDER — FLEET ENEMA 7-19 GM/118ML RE ENEM
1.0000 | ENEMA | Freq: Once | RECTAL | Status: AC
  Administered 2019-08-28: 1 via RECTAL
  Filled 2019-08-28 (×2): qty 1

## 2019-08-28 NOTE — Progress Notes (Addendum)
      [address]       [place] [address]             [phone]      Subjective:  No new complaints.  Continues to have some pain at chest tube site.  Objective: Vital signs in last 24 hours: Temp:  [97.3 F (36.3 C)-98.2 F (36.8 C)] 97.8 F (36.6 C) (05/18 0714) Pulse Rate:  [55-70] 63 (05/18 0714) Cardiac Rhythm: Normal sinus rhythm (05/18 0719) Resp:  [16-21] 16 (05/18 0714) BP: (93-125)/(66-82) 97/70 (05/18 0714) SpO2:  [94 %-100 %] 98 % (05/18 0714)  Intake/Output from previous day: 05/17 0701 - 05/18 0700 In: 1080 [P.O.:1080] Out: 1258 [Urine:1250; Chest Tube:8] Intake/Output this shift: Total I/O In: 240 [P.O.:240] Out: 4 [Chest Tube:4]  General appearance: alert, cooperative and no distress Heart: regular rate and rhythm Lungs: clear to auscultation bilaterally Abdomen: soft, non-tender; bowel sounds normal; no masses,  no organomegaly Extremities: extremities normal, atraumatic, no cyanosis or edema Wound: clean and dry  Lab Results: No results for input(s): WBC, HGB, HCT, PLT in the last 72 hours. BMET:  Recent Labs    08/27/19 0205  NA 129*  K 4.1  CL 97*  CO2 24  GLUCOSE 102*  BUN 20  CREATININE 1.07  CALCIUM 8.9    PT/INR: No results for input(s): LABPROT, INR in the last 72 hours. ABG    Component Value Date/Time   PHART 7.392 03/27/2016 0430   HCO3 25.0 03/27/2016 0430   TCO2 25 10/26/2016 2025   ACIDBASEDEF 2.7 (H) 03/25/2016 0340   O2SAT 97.0 03/27/2016 0430   CBG (last 3)  No results for input(s): GLUCAP in the last 72 hours.  Assessment/Plan:  1. CV- Sinus Brady, stable, BP stable- continue Coreg, Avapro 2. Pulm- CT 1+ air leak with cough on water seal, CXR with stable appearance.[name] leave in place today 3. Lovenox for DVT prophylaxis 4. Dispo- patient stable, 1+ air leak on water seal, leave chest tube in place, CXR is stable, repeat in AM   LOS: 4 days    Lowella Dandy, PA-C  08/28/2019 patient  examined and medical record and CXR trhis am reviewed,agree with above note. [name] 08/28/2019

## 2019-08-28 NOTE — Plan of Care (Signed)
  Problem: Health Behavior/Discharge Planning: Goal: Ability to manage health-related needs will improve Outcome: Progressing   Problem: Clinical Measurements: Goal: Ability to maintain clinical measurements within normal limits will improve Outcome: Progressing Goal: Will remain free from infection Outcome: Progressing Goal: Diagnostic test results will improve Outcome: Progressing Goal: Respiratory complications will improve Outcome: Progressing   

## 2019-08-28 NOTE — Plan of Care (Signed)

## 2019-08-29 ENCOUNTER — Inpatient Hospital Stay (HOSPITAL_COMMUNITY): Payer: Medicare HMO

## 2019-08-29 MED ORDER — LIP MEDEX EX OINT
1.0000 "application " | TOPICAL_OINTMENT | CUTANEOUS | Status: DC | PRN
  Filled 2019-08-29: qty 7

## 2019-08-29 MED ORDER — FAMOTIDINE 20 MG PO TABS
20.0000 mg | ORAL_TABLET | Freq: Two times a day (BID) | ORAL | Status: DC
  Administered 2019-08-29 – 2019-09-11 (×26): 20 mg via ORAL
  Filled 2019-08-29 (×27): qty 1

## 2019-08-29 MED ORDER — DIPHENHYDRAMINE HCL 50 MG/ML IJ SOLN
25.0000 mg | Freq: Four times a day (QID) | INTRAMUSCULAR | Status: DC | PRN
  Administered 2019-08-29 – 2019-09-03 (×2): 25 mg via INTRAVENOUS
  Filled 2019-08-29 (×2): qty 1

## 2019-08-29 NOTE — Plan of Care (Signed)
  Problem: Health Behavior/Discharge Planning: Goal: Ability to manage health-related needs will improve Outcome: Progressing   Problem: Clinical Measurements: Goal: Ability to maintain clinical measurements within normal limits will improve Outcome: Progressing Goal: Will remain free from infection Outcome: Progressing Goal: Diagnostic test results will improve Outcome: Progressing Goal: Respiratory complications will improve Outcome: Progressing   

## 2019-08-29 NOTE — Progress Notes (Signed)
Contacted by nursing regarding, swelling of his lower lip.  This developed acutely after lunch.  The patient states this has happened occasionally at home.  He can't not remember what caused.  For lunch he ate spaghetti and a fruit cup.  The patient denies numbness of his tongue, but states his lower lip feels "weird."    Plan:  Will check with dietary in regards to what patient received on his lunch tray.  He has not taken any new medications.  He is not on an ACE inhibitor.  I suspect he has some sort of food allergy that he is unaware of.  Will treat patient with Benadryl prn, Pepcid BID.   Lowella Dandy, PA-C

## 2019-08-29 NOTE — Progress Notes (Signed)
      [address]       [place] [address]             [phone]      Subjective:  No new complaints.  Up in bed eating breakfast.  Objective: Vital signs in last 24 hours: Temp:  [98 F (36.7 C)-98.5 F (36.9 C)] 98.2 F (36.8 C) (05/19 0702) Pulse Rate:  [54-63] 63 (05/19 0702) Cardiac Rhythm: Normal sinus rhythm (05/19 0707) Resp:  [15-22] 18 (05/19 0702) BP: (108-130)/(65-75) 119/73 (05/19 0702) SpO2:  [96 %-100 %] 97 % (05/19 0702)  Intake/Output from previous day: 05/18 0701 - 05/19 0700 In: 720 [P.O.:720] Out: 1255 [Urine:1250; Stool:1; Chest Tube:4]  General appearance: alert, cooperative and no distress Heart: regular rate and rhythm Lungs: clear to auscultation bilaterally Abdomen: soft, non-tender; bowel sounds normal; no masses,  no organomegaly Extremities: extremities normal, atraumatic, no cyanosis or edema Wound: clean and dry  Lab Results: No results for input(s): WBC, HGB, HCT, PLT in the last 72 hours. BMET:  Recent Labs    08/27/19 0205  NA 129*  K 4.1  CL 97*  CO2 24  GLUCOSE 102*  BUN 20  CREATININE 1.07  CALCIUM 8.9    PT/INR: No results for input(s): LABPROT, INR in the last 72 hours. ABG    Component Value Date/Time   PHART 7.392 03/27/2016 0430   HCO3 25.0 03/27/2016 0430   TCO2 25 10/26/2016 2025   ACIDBASEDEF 2.7 (H) 03/25/2016 0340   O2SAT 97.0 03/27/2016 0430   CBG (last 3)  No results for input(s): GLUCAP in the last 72 hours.  Assessment/Plan:  1. CV- Sinus Bradycardia- continue Coreg, Avapro 2. Pulm- no acute issues, CXR remains stable with left apical pneumothorax, + air leak with cough... leave in place today 3. Lovenox for DVT prophylaxis 4. Dispo- patient stable, CXR remains stable, leave chest tube in place on water seal, as 1+ air leak persists    LOS: 5 days    Lowella Dandy, PA-C 08/29/2019

## 2019-08-30 ENCOUNTER — Inpatient Hospital Stay (HOSPITAL_COMMUNITY): Payer: Medicare HMO

## 2019-08-30 MED ORDER — FLEET ENEMA 7-19 GM/118ML RE ENEM
1.0000 | ENEMA | Freq: Every day | RECTAL | Status: DC | PRN
  Administered 2019-08-31 – 2019-09-08 (×2): 1 via RECTAL
  Filled 2019-08-30 (×3): qty 1

## 2019-08-30 MED ORDER — HYDROMORPHONE HCL 1 MG/ML IJ SOLN
INTRAMUSCULAR | Status: AC
  Filled 2019-08-30: qty 2

## 2019-08-30 MED ORDER — LIDOCAINE HCL 1 % IJ SOLN
10.0000 mL | Freq: Once | INTRAMUSCULAR | Status: AC
  Administered 2019-08-30: 10 mL via INTRAPLEURAL
  Filled 2019-08-30: qty 10

## 2019-08-30 MED ORDER — TALC (STERITALC) POWDER FOR INTRAPLEURAL USE
4.0000 g | Freq: Once | INTRAVENOUS | Status: AC
  Administered 2019-08-30: 4 g via INTRAPLEURAL
  Filled 2019-08-30: qty 4

## 2019-08-30 MED ORDER — HYDROMORPHONE HCL 1 MG/ML IJ SOLN
2.0000 mg | INTRAMUSCULAR | Status: DC | PRN
  Administered 2019-08-30: 2 mg via INTRAVENOUS

## 2019-08-30 MED ORDER — ACETAMINOPHEN 325 MG PO TABS
650.0000 mg | ORAL_TABLET | Freq: Four times a day (QID) | ORAL | Status: DC | PRN
  Administered 2019-08-30 – 2019-09-01 (×5): 650 mg via ORAL
  Filled 2019-08-30 (×5): qty 2

## 2019-08-30 MED ORDER — IOHEXOL 300 MG/ML  SOLN
75.0000 mL | Freq: Once | INTRAMUSCULAR | Status: AC | PRN
  Administered 2019-08-30: 75 mL via INTRAVENOUS

## 2019-08-30 NOTE — Progress Notes (Addendum)
      [address]       [place] [address]             [phone]      Subjective:  No new complaints.  Lip swelling is improved this morning.  Up in chair eating breakfast  Objective: Vital signs in last 24 hours: Temp:  [97.7 F (36.5 C)-98.4 F (36.9 C)] 98.2 F (36.8 C) (05/20 0706) Pulse Rate:  [61-73] 61 (05/20 0706) Cardiac Rhythm: Normal sinus rhythm (05/20 0400) Resp:  [13-25] 25 (05/20 0706) BP: (96-145)/(66-99) 145/99 (05/20 0706) SpO2:  [99 %-100 %] 100 % (05/20 0706)  Intake/Output from previous day: 05/19 0701 - 05/20 0700 In: -  Out: 1200 [Urine:1200]  General appearance: alert, cooperative and no distress, lip swelling improved Heart: regular rate and rhythm Lungs: clear to auscultation bilaterally Abdomen: soft, non-tender; bowel sounds normal; no masses,  no organomegaly Wound: clean and dry  Lab Results: No results for input(s): WBC, HGB, HCT, PLT in the last 72 hours. BMET: No results for input(s): NA, K, CL, CO2, GLUCOSE, BUN, CREATININE, CALCIUM in the last 72 hours.  PT/INR: No results for input(s): LABPROT, INR in the last 72 hours. ABG    Component Value Date/Time   PHART 7.392 03/27/2016 0430   HCO3 25.0 03/27/2016 0430   TCO2 25 10/26/2016 2025   ACIDBASEDEF 2.7 (H) 03/25/2016 0340   O2SAT 97.0 03/27/2016 0430   CBG (last 3)  No results for input(s): GLUCAP in the last 72 hours.  Assessment/Plan:  1. CV- Sinus Bradycardia- continue Coreg, Avapro.. BP is creeping up will monitor if needed can increase ARB 2. Pulm-CT with persistent air leak, CXR pending for this AM, leave to water seal... ? Patient may benefit from [facility] pleurodesis? 3. Lip Swelling- suspect related to food allergy, improved this morning, continue benadryl prn 4. Lovenox for DVT prophylaxis.. patient not moving much will get PT consult 5. Dispo- patient stable, leave chest tube on water seal, review CXR once complete, unsure if he would benefit from  Talc pleurodesis.. CXR in AM  LOS: 6 days    Lowella Dandy, PA-C  08/30/2019  Air leak with cough persists  proceed with talc slurry injection today Obtain CT chest in case patient needs VATS  patient examined and medical record reviewed,agree with above note. [name] 08/30/2019

## 2019-08-30 NOTE — Progress Notes (Signed)
Pt had talc pleuredesis done , local anesthesia, done by Pa, dilaudid 2mg  admin as pt was in severe pain after. Now stable and resting.

## 2019-08-31 ENCOUNTER — Inpatient Hospital Stay (HOSPITAL_COMMUNITY): Payer: Medicare HMO

## 2019-08-31 LAB — BASIC METABOLIC PANEL
Anion gap: 9 (ref 5–15)
BUN: 23 mg/dL (ref 8–23)
CO2: 27 mmol/L (ref 22–32)
Calcium: 9.2 mg/dL (ref 8.9–10.3)
Chloride: 97 mmol/L — ABNORMAL LOW (ref 98–111)
Creatinine, Ser: 1.42 mg/dL — ABNORMAL HIGH (ref 0.61–1.24)
GFR calc Af Amer: 59 mL/min — ABNORMAL LOW (ref >60)
GFR calc non Af Amer: 51 mL/min — ABNORMAL LOW (ref >60)
Glucose, Bld: 115 mg/dL — ABNORMAL HIGH (ref 70–99)
Potassium: 4.3 mmol/L (ref 3.5–5.1)
Sodium: 133 mmol/L — ABNORMAL LOW (ref 135–145)

## 2019-08-31 LAB — CBC
HCT: 36.5 % — ABNORMAL LOW (ref 39.0–52.0)
Hemoglobin: 11.4 g/dL — ABNORMAL LOW (ref 13.0–17.0)
MCH: 27.1 pg (ref 26.0–34.0)
MCHC: 31.2 g/dL (ref 30.0–36.0)
MCV: 86.7 fL (ref 80.0–100.0)
Platelets: 178 10*3/uL (ref 150–400)
RBC: 4.21 MIL/uL — ABNORMAL LOW (ref 4.22–5.81)
RDW: 13.6 % (ref 11.5–15.5)
WBC: 10.2 10*3/uL (ref 4.0–10.5)
nRBC: 0 % (ref 0.0–0.2)

## 2019-08-31 MED ORDER — CARVEDILOL 3.125 MG PO TABS
3.1250 mg | ORAL_TABLET | Freq: Two times a day (BID) | ORAL | Status: DC
  Administered 2019-08-31 – 2019-09-11 (×22): 3.125 mg via ORAL
  Filled 2019-08-31 (×23): qty 1

## 2019-08-31 MED ORDER — SORBITOL 70 % SOLN
60.0000 mL | Freq: Once | Status: AC
  Administered 2019-08-31: 60 mL via ORAL
  Filled 2019-08-31: qty 60

## 2019-08-31 MED ORDER — ALBUMIN HUMAN 5 % IV SOLN
25.0000 g | Freq: Once | INTRAVENOUS | Status: AC
  Administered 2019-08-31: 25 g via INTRAVENOUS
  Filled 2019-08-31: qty 500

## 2019-08-31 MED ORDER — DEXTROSE-NACL 5-0.45 % IV SOLN: INTRAVENOUS | Status: DC

## 2019-08-31 NOTE — Progress Notes (Addendum)
      [address]       [name] [address]             [phone]       Subjective:  Patient with extreme abdominal pain this morning.  He states it just started.   Objective: Vital signs in last 24 hours: Temp:  [97.7 F (36.5 C)-99.2 F (37.3 C)] 97.7 F (36.5 C) (05/21 0259) Pulse Rate:  [58-91] 66 (05/21 0600) Cardiac Rhythm: Normal sinus rhythm (05/20 2100) Resp:  [15-27] 20 (05/21 0600) BP: (70-149)/(48-87) 85/56 (05/21 0600) SpO2:  [94 %-100 %] 100 % (05/21 0600)  Intake/Output from previous day: 05/20 0701 - 05/21 0700 In: 457 [P.O.:457] Out: 1315 [Urine:1225; Chest Tube:90]  General appearance: alert, cooperative and no distress Heart: regular rate and rhythm Lungs: clear to auscultation bilaterally Abdomen: distened, tender to palpation, no BS appreciated, rigid Extremities: extremities normal, atraumatic, no cyanosis or edema Wound: clean and dry  Lab Results: No results for input(s): WBC, HGB, HCT, PLT in the last 72 hours. BMET: No results for input(s): NA, K, CL, CO2, GLUCOSE, BUN, CREATININE, CALCIUM in the last 72 hours.  PT/INR: No results for input(s): LABPROT, INR in the last 72 hours. ABG    Component Value Date/Time   PHART 7.392 03/27/2016 0430   HCO3 25.0 03/27/2016 0430   TCO2 25 10/26/2016 2025   ACIDBASEDEF 2.7 (H) 03/25/2016 0340   O2SAT 97.0 03/27/2016 0430   CBG (last 3)  No results for input(s): GLUCAP in the last 72 hours.  Assessment/Plan:  1. CV- Sinus Bradycardia, hypotension- will stop Avapro and reduce Coreg further 2. Pulm- CXR with improvement of pneumothorax, talc pleurodesis performed yesterday, + air leak present this morning 3. GI- severe abdominal pain this morning, + rigid/distention.. make NPO, start IV fluids at 50/hr.. stat Abd film ordered.[name] to check for likely ileus... if abnormal may need CT scan to r/o perforation 4. Lovenox for DVT prophylaxis 5. Lip edema- resolved 6. Dispo- patient  with acute onset abdominal pain/distention check stat ABD film, CXR with improved pneumothorax, air leak persists, decrease BB, stop Avapro with hypotension, check stat labs   LOS: 7 days     KUB reviewed:  Large stool burden, no ileus... will order enema   Lowella Dandy, PA-C 08/31/2019   patient examined and KUB image reviewed Patient responding to treatment for constipation. Leave chest tube in place for persistent air leak. CT scan shows blebs L apex- tube in good position. May need VATS [robotic] next week  patient examined and medical record reviewed,agree with above note. [name] 08/31/2019

## 2019-08-31 NOTE — Progress Notes (Signed)
Notified MD about low BP 75/54 while getting to [facility] patient c/o dizziness otherwise stable HR 60 NSR. MD order to give Albumin. I will continue to monitor.

## 2019-09-01 ENCOUNTER — Inpatient Hospital Stay (HOSPITAL_COMMUNITY): Payer: Medicare HMO

## 2019-09-01 LAB — CBC
HCT: 34.7 % — ABNORMAL LOW (ref 39.0–52.0)
Hemoglobin: 11.1 g/dL — ABNORMAL LOW (ref 13.0–17.0)
MCH: 27.3 pg (ref 26.0–34.0)
MCHC: 32 g/dL (ref 30.0–36.0)
MCV: 85.5 fL (ref 80.0–100.0)
Platelets: 168 10*3/uL (ref 150–400)
RBC: 4.06 MIL/uL — ABNORMAL LOW (ref 4.22–5.81)
RDW: 13.5 % (ref 11.5–15.5)
WBC: 8 10*3/uL (ref 4.0–10.5)
nRBC: 0 % (ref 0.0–0.2)

## 2019-09-01 LAB — RENAL FUNCTION PANEL
Albumin: 3.5 g/dL (ref 3.5–5.0)
Anion gap: 9 (ref 5–15)
BUN: 11 mg/dL (ref 8–23)
CO2: 25 mmol/L (ref 22–32)
Calcium: 9.1 mg/dL (ref 8.9–10.3)
Chloride: 98 mmol/L (ref 98–111)
Creatinine, Ser: 0.9 mg/dL (ref 0.61–1.24)
GFR calc Af Amer: >60 mL/min (ref >60)
GFR calc non Af Amer: >60 mL/min (ref >60)
Glucose, Bld: 111 mg/dL — ABNORMAL HIGH (ref 70–99)
Phosphorus: 3 mg/dL (ref 2.5–4.6)
Potassium: 4.6 mmol/L (ref 3.5–5.1)
Sodium: 132 mmol/L — ABNORMAL LOW (ref 135–145)

## 2019-09-01 NOTE — Progress Notes (Signed)
      [address]       [name] [address]             [phone]       Subjective: Says he is comfortable. No shortness of breath. Wants to resume eating.  Bowel movement yesterday.after enema.   Objective: Vital signs in last 24 hours: Temp:  [98.2 F (36.8 C)-99.3 F (37.4 C)] 99.3 F (37.4 C) (05/22 1055) Pulse Rate:  [64-75] 71 (05/22 1055) Cardiac Rhythm: Normal sinus rhythm (05/22 0708) Resp:  [19-25] 25 (05/22 1055) BP: (104-123)/(64-82) 117/71 (05/22 1055) SpO2:  [95 %-99 %] 99 % (05/22 1055)     Intake/Output from previous day: 05/21 0701 - 05/22 0700 In: 1632.6 [P.O.:477; I.V.:1155.6] Out: 2230 [Urine:2200; Chest Tube:30] Intake/Output this shift: Total I/O In: -  Out: 800 [Urine:800]  General appearance: alert, cooperative and no distress Heart: SR. Lungs: Breath sounds clear. Slightly larger left apical PTX on CXR today. Small air leak persists. Abdomen: Soft,flat,NT. Active bowel sounds Wound: the left pigtail catheter insertion site is tender but dry . The tube is well sucured.   Lab Results: Recent Labs    08/31/19 0740 09/01/19 0230  WBC 10.2 8.0  HGB 11.4* 11.1*  HCT 36.5* 34.7*  PLT 178 168   BMET:  Recent Labs    08/31/19 0740 09/01/19 0230  NA 133* 132*  K 4.3 4.6  CL 97* 98  CO2 27 25  GLUCOSE 115* 111*  BUN 23 11  CREATININE 1.42* 0.90  CALCIUM 9.2 9.1    PT/INR: No results for input(s): LABPROT, INR in the last 72 hours. ABG    Component Value Date/Time   PHART 7.392 03/27/2016 0430   HCO3 25.0 03/27/2016 0430   TCO2 25 10/26/2016 2025   ACIDBASEDEF 2.7 (H) 03/25/2016 0340   O2SAT 97.0 03/27/2016 0430   CBG (last 3)  No results for input(s): GLUCAP in the last 72 hours.  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  He continues to have an active air leak.  The pneumothorax is slightly larger on today's chest x-ray.  We will leave the tube in place.   May require surgical intervention with blebectomy and pleurodesis next week.  -Constipation-bowel function returned.  Resume diet.  -Renal insufficiency-creatinine has normalized.  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

## 2019-09-02 ENCOUNTER — Inpatient Hospital Stay (HOSPITAL_COMMUNITY): Payer: Medicare HMO

## 2019-09-02 LAB — RENAL FUNCTION PANEL
Albumin: 3.2 g/dL — ABNORMAL LOW (ref 3.5–5.0)
Anion gap: 12 (ref 5–15)
BUN: 11 mg/dL (ref 8–23)
CO2: 24 mmol/L (ref 22–32)
Calcium: 9.2 mg/dL (ref 8.9–10.3)
Chloride: 98 mmol/L (ref 98–111)
Creatinine, Ser: 0.92 mg/dL (ref 0.61–1.24)
GFR calc Af Amer: >60 mL/min (ref >60)
GFR calc non Af Amer: >60 mL/min (ref >60)
Glucose, Bld: 108 mg/dL — ABNORMAL HIGH (ref 70–99)
Phosphorus: 3.4 mg/dL (ref 2.5–4.6)
Potassium: 4.1 mmol/L (ref 3.5–5.1)
Sodium: 134 mmol/L — ABNORMAL LOW (ref 135–145)

## 2019-09-02 LAB — CBC
HCT: 35.6 % — ABNORMAL LOW (ref 39.0–52.0)
Hemoglobin: 11.5 g/dL — ABNORMAL LOW (ref 13.0–17.0)
MCH: 27.7 pg (ref 26.0–34.0)
MCHC: 32.3 g/dL (ref 30.0–36.0)
MCV: 85.8 fL (ref 80.0–100.0)
Platelets: 178 10*3/uL (ref 150–400)
RBC: 4.15 MIL/uL — ABNORMAL LOW (ref 4.22–5.81)
RDW: 13.5 % (ref 11.5–15.5)
WBC: 7.3 10*3/uL (ref 4.0–10.5)
nRBC: 0 % (ref 0.0–0.2)

## 2019-09-02 LAB — TROPONIN I (HIGH SENSITIVITY)
Troponin I (High Sensitivity): 6 ng/L (ref <18)
Troponin I (High Sensitivity): 7 ng/L (ref <18)

## 2019-09-02 NOTE — Progress Notes (Addendum)
       [address]       [name] [address]             [phone]      Subjective: Developed pain in his mid anterior chest after he sat up for the CXR this am. This persisted so that he was unable to eat his breakfast. Says "it's like when I had my heart attack".   Objective: Vital signs in last 24 hours: Temp:  [97.1 F (36.2 C)-99.3 F (37.4 C)] 97.1 F (36.2 C) (05/23 0738) Pulse Rate:  [70-84] 76 (05/23 0738) Cardiac Rhythm: Normal sinus rhythm (05/23 0300) Resp:  [17-25] 21 (05/23 0738) BP: (111-126)/(71-99) 111/99 (05/23 0738) SpO2:  [96 %-100 %] 97 % (05/23 0738)    Intake/Output from previous day: 05/22 0701 - 05/23 0700 In: 1069.8 [P.O.:470; I.V.:599.8] Out: 1325 [Urine:1325] Intake/Output this shift: No intake/output data recorded.  General appearance: alert, cooperative and moderate distress, skin is warm and dry.  Heart: SR on monitor. No Murmur. EKG showed NSR with few PAC's and age-undetermined inferior MI. No acute ischemic changes.  Lungs: Breath sounds clear. Similar left apical PTX on CXR today. Small air leak persists. Abdomen: Soft,flat,NT. Active bowel sounds Wound: the left pigtail catheter insertion site is tender but dry . The tube is well sucured.   Lab Results: Recent Labs    09/01/19 0230 09/02/19 0225  WBC 8.0 7.3  HGB 11.1* 11.5*  HCT 34.7* 35.6*  PLT 168 178   BMET:  Recent Labs    09/01/19 0230 09/02/19 0225  NA 132* 134*  K 4.6 4.1  CL 98 98  CO2 25 24  GLUCOSE 111* 108*  BUN 11 11  CREATININE 0.90 0.92  CALCIUM 9.1 9.2    PT/INR: No results for input(s): LABPROT, INR in the last 72 hours. ABG    Component Value Date/Time   PHART 7.392 03/27/2016 0430   HCO3 25.0 03/27/2016 0430   TCO2 25 10/26/2016 2025   ACIDBASEDEF 2.7 (H) 03/25/2016 0340   O2SAT 97.0 03/27/2016 0430   CBG (last 3)  No results for input(s): GLUCAP in the last 72 hours.  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  He continues to have an active air leak.  The pneumothorax is similar on today's chest x-ray.  We will leave the tube in place.  May require surgical intervention with blebectomy and pleurodesis next week.  -Chest pain- The CT was briefly placed to suction and he said this immediately intensified the pain he was experiencing. CT placed back to water seal.  Do not think this is angina but will check HS trop x 2 this am.   -Constipation-bowel function returned.  Resumed diet.  -Renal insufficiency-creatinine has normalized and is stable.  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 Will d/w partners possible L robotic VATS for bleb stapling. patient examined and medical record reviewed,agree with above note. [name] 09/03/2019

## 2019-09-03 ENCOUNTER — Inpatient Hospital Stay (HOSPITAL_COMMUNITY): Payer: Medicare HMO

## 2019-09-03 LAB — CBC
HCT: 35.5 % — ABNORMAL LOW (ref 39.0–52.0)
Hemoglobin: 11.5 g/dL — ABNORMAL LOW (ref 13.0–17.0)
MCH: 27.3 pg (ref 26.0–34.0)
MCHC: 32.4 g/dL (ref 30.0–36.0)
MCV: 84.3 fL (ref 80.0–100.0)
Platelets: 193 10*3/uL (ref 150–400)
RBC: 4.21 MIL/uL — ABNORMAL LOW (ref 4.22–5.81)
RDW: 13.4 % (ref 11.5–15.5)
WBC: 7.7 10*3/uL (ref 4.0–10.5)
nRBC: 0 % (ref 0.0–0.2)

## 2019-09-03 LAB — RENAL FUNCTION PANEL
Albumin: 3.1 g/dL — ABNORMAL LOW (ref 3.5–5.0)
Anion gap: 12 (ref 5–15)
BUN: 10 mg/dL (ref 8–23)
CO2: 20 mmol/L — ABNORMAL LOW (ref 22–32)
Calcium: 9.1 mg/dL (ref 8.9–10.3)
Chloride: 101 mmol/L (ref 98–111)
Creatinine, Ser: 0.96 mg/dL (ref 0.61–1.24)
GFR calc Af Amer: >60 mL/min (ref >60)
GFR calc non Af Amer: >60 mL/min (ref >60)
Glucose, Bld: 94 mg/dL (ref 70–99)
Phosphorus: 4 mg/dL (ref 2.5–4.6)
Potassium: 4.3 mmol/L (ref 3.5–5.1)
Sodium: 133 mmol/L — ABNORMAL LOW (ref 135–145)

## 2019-09-03 NOTE — Progress Notes (Addendum)
      [address]       [name] [address]             [phone]      Subjective:  Patient complains of swollen tongue.  He states he cant eat with his tongue like this.  Objective: Vital signs in last 24 hours: Temp:  [98.5 F (36.9 C)-99.5 F (37.5 C)] 98.8 F (37.1 C) (05/24 1040) Pulse Rate:  [63-82] 79 (05/24 1040) Cardiac Rhythm: Normal sinus rhythm;Bundle branch block (05/24 0934) Resp:  [20-21] 21 (05/24 1040) BP: (112-127)/(74-89) 127/89 (05/24 1040) SpO2:  [97 %-100 %] 98 % (05/24 1040)  Intake/Output from previous day: 05/23 0701 - 05/24 0700 In: 1191.4 [I.V.:1191.4] Out: 1350 [Urine:1350]  General appearance: alert, cooperative, no distress and + enlarged tongue Heart: regular rate and rhythm Lungs: clear to auscultation bilaterally Abdomen: soft, non-tender; bowel sounds normal; no masses,  no organomegaly Wound: clean and dry  Lab Results: Recent Labs    09/02/19 0225 09/03/19 0242  WBC 7.3 7.7  HGB 11.5* 11.5*  HCT 35.6* 35.5*  PLT 178 193   BMET:  Recent Labs    09/02/19 0225 09/03/19 0242  NA 134* 133*  K 4.1 4.3  CL 98 101  CO2 24 20*  GLUCOSE 108* 94  BUN 11 10  CREATININE 0.92 0.96  CALCIUM 9.2 9.1    PT/INR: No results for input(s): LABPROT, INR in the last 72 hours. ABG    Component Value Date/Time   PHART 7.392 03/27/2016 0430   HCO3 25.0 03/27/2016 0430   TCO2 25 10/26/2016 2025   ACIDBASEDEF 2.7 (H) 03/25/2016 0340   O2SAT 97.0 03/27/2016 0430   CBG (last 3)  No results for input(s): GLUCAP in the last 72 hours.  Assessment/Plan:  1. CV- NSR, BP controlled- continue Coreg at reduced dose 2. Pulm- no air leak from chest tube, CXR shows stable appearance of left apical pneumothorax 3. GI- h/o constipation, resolved continue enema prn 4. Localized edema of tongue.. per patient this has happened to him before.. unsure of cause, have d/c'd oxycodone, continue prn benadryl 5. Lovenox for DVT  prophylaxis 6. Dispo- patient stable, will leave chest tube in place today.[name] air leak has resolved, edema of tongue unsure of cause, continue current care, may require surgical intervention... Dr. Cliffton Asters is evaluating need for robotic assisted VATS   LOS: 10 days    Lowella Dandy, PA-C  09/03/2019 Patient will need robotic VATS for persistent spontaneous pntx from apical bleb disease  patient examined and medical record reviewed,agree with above note. [name] 09/03/2019

## 2019-09-03 NOTE — Progress Notes (Signed)
Upon assesment, pts tongue swollen and pt is c/o sore throat. Unable to eat breakfast this AM due to swollen tongue and soreness in throat. O2 saturations maintaining in the upper 90s. Tessa with CVTS notified. 25mg  benadryl given IV. Will monitor.

## 2019-09-03 NOTE — Progress Notes (Signed)
     [address]       [name] [address]             [phone]       Images reviewed.  Apical bullous disease. Trace pneumoT on CXR Pt does not currently have a leak on exam.  Currently on WS.  Pt has had talc pleurodesis.  Air leak has resolved.  Will continue to follow.  Likely will not need surgery.  Harrell Keane Scrape

## 2019-09-04 ENCOUNTER — Other Ambulatory Visit: Payer: Self-pay | Admitting: Cardiothoracic Surgery

## 2019-09-04 ENCOUNTER — Inpatient Hospital Stay (HOSPITAL_COMMUNITY): Payer: Medicare HMO

## 2019-09-04 DIAGNOSIS — I25119 Atherosclerotic heart disease of native coronary artery with unspecified angina pectoris: Secondary | ICD-10-CM

## 2019-09-04 LAB — CBC
HCT: 35.9 % — ABNORMAL LOW (ref 39.0–52.0)
Hemoglobin: 11.4 g/dL — ABNORMAL LOW (ref 13.0–17.0)
MCH: 27.2 pg (ref 26.0–34.0)
MCHC: 31.8 g/dL (ref 30.0–36.0)
MCV: 85.7 fL (ref 80.0–100.0)
Platelets: 196 10*3/uL (ref 150–400)
RBC: 4.19 MIL/uL — ABNORMAL LOW (ref 4.22–5.81)
RDW: 13.4 % (ref 11.5–15.5)
WBC: 7.6 10*3/uL (ref 4.0–10.5)
nRBC: 0 % (ref 0.0–0.2)

## 2019-09-04 LAB — RENAL FUNCTION PANEL
Albumin: 2.9 g/dL — ABNORMAL LOW (ref 3.5–5.0)
Anion gap: 12 (ref 5–15)
BUN: 13 mg/dL (ref 8–23)
CO2: 24 mmol/L (ref 22–32)
Calcium: 9 mg/dL (ref 8.9–10.3)
Chloride: 99 mmol/L (ref 98–111)
Creatinine, Ser: 0.9 mg/dL (ref 0.61–1.24)
GFR calc Af Amer: >60 mL/min (ref >60)
GFR calc non Af Amer: >60 mL/min (ref >60)
Glucose, Bld: 104 mg/dL — ABNORMAL HIGH (ref 70–99)
Phosphorus: 3.8 mg/dL (ref 2.5–4.6)
Potassium: 4.2 mmol/L (ref 3.5–5.1)
Sodium: 135 mmol/L (ref 135–145)

## 2019-09-04 MED ORDER — ADULT MULTIVITAMIN W/MINERALS CH: 1.0000 | ORAL_TABLET | Freq: Every day | ORAL | Status: AC

## 2019-09-04 MED ORDER — ACETAMINOPHEN 325 MG PO TABS: 650.0000 mg | ORAL_TABLET | Freq: Four times a day (QID) | ORAL | Status: AC | PRN

## 2019-09-04 NOTE — Progress Notes (Signed)
Tongue slightly swollen on assessment, denied any discomfort. Managed to eat  breakfast . Continue to monitor.

## 2019-09-04 NOTE — Plan of Care (Signed)
  Problem: Health Behavior/Discharge Planning: Goal: Ability to manage health-related needs will improve Outcome: Progressing   Problem: Clinical Measurements: Goal: Ability to maintain clinical measurements within normal limits will improve Outcome: Progressing Goal: Will remain free from infection Outcome: Progressing Goal: Diagnostic test results will improve Outcome: Progressing Goal: Respiratory complications will improve Outcome: Progressing Goal: Cardiovascular complication will be avoided Outcome: Progressing   Problem: Elimination: Goal: Will not experience complications related to bowel motility Outcome: Progressing   Problem: Coping: Goal: Level of anxiety will decrease Outcome: Progressing   Problem: Nutrition: Goal: Adequate nutrition will be maintained Outcome: Progressing   Problem: Pain Managment: Goal: General experience of comfort will improve Outcome: Progressing   Problem: Safety: Goal: Ability to remain free from injury will improve Outcome: Progressing   Problem: Skin Integrity: Goal: Risk for impaired skin integrity will decrease Outcome: Progressing

## 2019-09-04 NOTE — Progress Notes (Signed)
  Subjective: Feels well No air leak from chest tube, CXR ok Leave chest tube to water seal- plan to remove tomorrow if no change  Objective: Vital signs in last 24 hours: Temp:  [97.9 F (36.6 C)-99.3 F (37.4 C)] 97.9 F (36.6 C) (05/25 1101) Pulse Rate:  [77-86] 77 (05/25 1101) Cardiac Rhythm: Normal sinus rhythm (05/25 0852) Resp:  [19-24] 20 (05/25 1101) BP: (94-128)/(55-93) 112/90 (05/25 1101) SpO2:  [95 %-98 %] 95 % (05/25 1101)  Hemodynamic parameters for last 24 hours:    Intake/Output from previous day: 05/24 0701 - 05/25 0700 In: 1213.4 [I.V.:1213.4] Out: 900 [Urine:900] Intake/Output this shift: Total I/O In: 270 [P.O.:120; I.V.:150] Out: 850 [Urine:850]       Exam    General- alert and comfortable    Neck- no JVD, no cervical adenopathy palpable, no carotid bruit   Lungs- clear without rales, wheezes   Cor- regular rate and rhythm, no murmur , gallop   Abdomen- soft, non-tender   Extremities - warm, non-tender, minimal edema   Neuro- oriented, appropriate, no focal weakness   Lab Results: Recent Labs    09/03/19 0242 09/04/19 0236  WBC 7.7 7.6  HGB 11.5* 11.4*  HCT 35.5* 35.9*  PLT 193 196   BMET:  Recent Labs    09/03/19 0242 09/04/19 0236  NA 133* 135  K 4.3 4.2  CL 101 99  CO2 20* 24  GLUCOSE 94 104*  BUN 10 13  CREATININE 0.96 0.90  CALCIUM 9.1 9.0    PT/INR: No results for input(s): LABPROT, INR in the last 72 hours. ABG    Component Value Date/Time   PHART 7.392 03/27/2016 0430   HCO3 25.0 03/27/2016 0430   TCO2 25 10/26/2016 2025   ACIDBASEDEF 2.7 (H) 03/25/2016 0340   O2SAT 97.0 03/27/2016 0430   CBG (last 3)  No results for input(s): GLUCAP in the last 72 hours.  Assessment/Plan: S/P  L chest tube for spontaneous pntx Plan to DC chest tube in am if no air leak and CXR ok    LOS: 11 days    [name] 09/04/2019

## 2019-09-05 ENCOUNTER — Inpatient Hospital Stay (HOSPITAL_COMMUNITY): Payer: Medicare HMO

## 2019-09-05 ENCOUNTER — Ambulatory Visit: Payer: Medicare HMO | Admitting: Cardiothoracic Surgery

## 2019-09-05 DIAGNOSIS — J9311 Primary spontaneous pneumothorax: Secondary | ICD-10-CM

## 2019-09-05 LAB — RENAL FUNCTION PANEL
Albumin: 3 g/dL — ABNORMAL LOW (ref 3.5–5.0)
Anion gap: 11 (ref 5–15)
BUN: 12 mg/dL (ref 8–23)
CO2: 24 mmol/L (ref 22–32)
Calcium: 9.3 mg/dL (ref 8.9–10.3)
Chloride: 98 mmol/L (ref 98–111)
Creatinine, Ser: 0.89 mg/dL (ref 0.61–1.24)
GFR calc Af Amer: >60 mL/min (ref >60)
GFR calc non Af Amer: >60 mL/min (ref >60)
Glucose, Bld: 94 mg/dL (ref 70–99)
Phosphorus: 4.1 mg/dL (ref 2.5–4.6)
Potassium: 4.1 mmol/L (ref 3.5–5.1)
Sodium: 133 mmol/L — ABNORMAL LOW (ref 135–145)

## 2019-09-05 LAB — CBC
HCT: 36.8 % — ABNORMAL LOW (ref 39.0–52.0)
Hemoglobin: 11.7 g/dL — ABNORMAL LOW (ref 13.0–17.0)
MCH: 27 pg (ref 26.0–34.0)
MCHC: 31.8 g/dL (ref 30.0–36.0)
MCV: 84.8 fL (ref 80.0–100.0)
Platelets: 234 10*3/uL (ref 150–400)
RBC: 4.34 MIL/uL (ref 4.22–5.81)
RDW: 13.3 % (ref 11.5–15.5)
WBC: 7.1 10*3/uL (ref 4.0–10.5)
nRBC: 0 % (ref 0.0–0.2)

## 2019-09-05 NOTE — Progress Notes (Addendum)
      [address]       [name] [address]             [phone]           Subjective: Patient resting this am. When asked if his breathing is ok, he responded yes.  Objective: Vital signs in last 24 hours: Temp:  [97.9 F (36.6 C)-98.8 F (37.1 C)] 98.8 F (37.1 C) (05/26 0337) Pulse Rate:  [69-79] 69 (05/26 0337) Cardiac Rhythm: Normal sinus rhythm (05/26 0337) Resp:  [18-25] 25 (05/26 0337) BP: (94-137)/(55-90) 109/83 (05/26 0337) SpO2:  [95 %-99 %] 96 % (05/26 0337)     Intake/Output from previous day: 05/25 0701 - 05/26 0700 In: 1030 [P.O.:480; I.V.:550] Out: 1850 [Urine:1850]   Physical Exam:  Cardiovascular: RRR Pulmonary: Clear to auscultation bilaterally Abdomen: Soft, non tender, bowel sounds present. Wounds: Dressing is clean and dry.   Chest Tube: to water seal, no air leak  Lab Results: CBC: Recent Labs    09/04/19 0236 09/05/19 0302  WBC 7.6 7.1  HGB 11.4* 11.7*  HCT 35.9* 36.8*  PLT 196 234   BMET:  Recent Labs    09/04/19 0236 09/05/19 0302  NA 135 133*  K 4.2 4.1  CL 99 98  CO2 24 24  GLUCOSE 104* 94  BUN 13 12  CREATININE 0.90 0.89  CALCIUM 9.0 9.3    PT/INR: No results for input(s): LABPROT, INR in the last 72 hours. ABG:  INR: Will add last result for INR, ABG once components are confirmed Will add last 4 CBG results once components are confirmed  Assessment/Plan:  1. CV - SR with HR in the 60-70's. On Coreg 3.125 mg bid. 2.  Pulmonary - On room air. Chest tube with scant output. Chest tube is to water seal, no air leak. CXR this am appears relatively stable. As discussed with Dr. Donata Clay, will remove chest tube 3. Mild hyponatremia-sodium 133 this am 4. Anemia-H and H this am 11.7 and 36.8 5. Check CXR in am and if stable, will discharge  [name] [name] 09/05/2019,7:05 AM [phone]  Remove chest tube and check PA/LAT cxr in am patient examined and medical record reviewed,agree  with above note. [name] 09/05/2019

## 2019-09-05 NOTE — TOC Progression Note (Signed)
Transition of Care [facility]) - Progression Note    Patient Details  Name: Randall Patel MRN: [id number] Date of Birth: 1950/12/23  Transition of Care [facility]) CM/SW Contact  Leone Haven, RN Phone Number: 09/05/2019, 3:26 PM  Clinical Narrative:    Patient from home, here with ptx, chest tube to water seal, plan to dc chest tube today and get cxr.  Potential dc tomorrow.         Expected Discharge Plan and Services                                                 Social Determinants of Health (SDOH) Interventions    Readmission Risk Interventions No flowsheet data found.

## 2019-09-05 NOTE — Progress Notes (Signed)
Left chest tube removed tolerated well. Denied any discomfort. Continue to monitor.

## 2019-09-05 NOTE — Plan of Care (Signed)

## 2019-09-06 ENCOUNTER — Inpatient Hospital Stay (HOSPITAL_COMMUNITY): Payer: Medicare HMO

## 2019-09-06 DIAGNOSIS — J9311 Primary spontaneous pneumothorax: Secondary | ICD-10-CM

## 2019-09-06 MED ORDER — CARVEDILOL 3.125 MG PO TABS: 3.1250 mg | ORAL_TABLET | Freq: Two times a day (BID) | ORAL | 1 refills | Status: AC

## 2019-09-06 MED ORDER — ENOXAPARIN SODIUM 30 MG/0.3ML ~~LOC~~ SOLN
30.0000 mg | SUBCUTANEOUS | Status: DC
  Filled 2019-09-06 (×2): qty 0.3

## 2019-09-06 MED ORDER — TRAMADOL HCL 50 MG PO TABS: 50.0000 mg | ORAL_TABLET | Freq: Three times a day (TID) | ORAL | 0 refills | Status: AC | PRN

## 2019-09-06 NOTE — Plan of Care (Signed)

## 2019-09-06 NOTE — Progress Notes (Addendum)
      [address]       [place] [address]             [phone]           Subjective: Patient on bedside commode this am. He states his breathing is "pretty good".  Objective: Vital signs in last 24 hours: Temp:  [98 F (36.7 C)-98.3 F (36.8 C)] 98 F (36.7 C) (05/27 0337) Pulse Rate:  [68-88] 71 (05/27 0337) Cardiac Rhythm: Normal sinus rhythm (05/27 0344) Resp:  [16-28] 20 (05/27 0337) BP: (117-135)/(84-92) 135/92 (05/27 0337) SpO2:  [95 %-99 %] 95 % (05/27 0337)     Intake/Output from previous day: 05/26 0701 - 05/27 0700 In: 1270 [P.O.:720; I.V.:550] Out: 1200 [Urine:1200]   Physical Exam:  Cardiovascular: RRR Pulmonary: Diminished bilaterally Abdomen: Soft, non tender, bowel sounds present. Wounds: Dressing is clean and dry.     Lab Results: CBC: Recent Labs    09/04/19 0236 09/05/19 0302  WBC 7.6 7.1  HGB 11.4* 11.7*  HCT 35.9* 36.8*  PLT 196 234   BMET:  Recent Labs    09/04/19 0236 09/05/19 0302  NA 135 133*  K 4.2 4.1  CL 99 98  CO2 24 24  GLUCOSE 104* 94  BUN 13 12  CREATININE 0.90 0.89  CALCIUM 9.0 9.3    PT/INR: No results for input(s): LABPROT, INR in the last 72 hours. ABG:  INR: Will add last result for INR, ABG once components are confirmed Will add last 4 CBG results once components are confirmed  Assessment/Plan:  1. CV - SR with HR in the 70's this am. On Coreg 3.125 mg bid. Patient was on Spironolactone and Irbesartan prior to admission but with previous hypotension will not resume at discharge. Patient has a follow up appointment to see his cardiologist next month.  2.  Pulmonary - On room air. Chest tube with scant output. Chest tube is to water seal, no air leak. CXR this am appears relatively stable.  3. Mild hyponatremia-sodium yesterday 133 4. Anemia-H and H yesterday stable at11.7 and 36.8 5. Will await radiologist's interpretation of CXR;probable discharge  Randall M  [name] 09/06/2019,6:58 AM [phone]  CXR reviewed- small L basilar pneumothorax present which will need followup CXR in am - keep in hospital today  patient examined and medical record reviewed,agree with above note. Randall Patel 09/06/2019

## 2019-09-07 ENCOUNTER — Encounter (HOSPITAL_COMMUNITY): Payer: Self-pay | Admitting: Cardiothoracic Surgery

## 2019-09-07 ENCOUNTER — Encounter (HOSPITAL_COMMUNITY)
Admission: EM | Disposition: A | Discharge status: Home / Self Care | Payer: Self-pay | Source: Home / Self Care | Attending: Cardiothoracic Surgery

## 2019-09-07 ENCOUNTER — Inpatient Hospital Stay (HOSPITAL_COMMUNITY): Payer: Medicare HMO

## 2019-09-07 ENCOUNTER — Inpatient Hospital Stay (HOSPITAL_COMMUNITY): Payer: Medicare HMO | Admitting: Certified Registered"

## 2019-09-07 DIAGNOSIS — Z9889 Other specified postprocedural states: Secondary | ICD-10-CM

## 2019-09-07 HISTORY — PX: PLEURECTOMY: SHX5081

## 2019-09-07 HISTORY — PX: PLEURADESIS: SHX6030

## 2019-09-07 HISTORY — PX: INTERCOSTAL NERVE BLOCK: SHX5021

## 2019-09-07 LAB — GLUCOSE, CAPILLARY
Glucose-Capillary: 87 mg/dL (ref 70–99)
Glucose-Capillary: 88 mg/dL (ref 70–99)

## 2019-09-07 LAB — TYPE AND SCREEN
ABO/RH(D): B POS
Antibody Screen: NEGATIVE

## 2019-09-07 LAB — ABO/RH: ABO/RH(D): B POS

## 2019-09-07 LAB — SURGICAL PCR SCREEN
MRSA, PCR: NEGATIVE
Staphylococcus aureus: NEGATIVE

## 2019-09-07 SURGERY — WEDGE RESECTION, LUNG, ROBOT-ASSISTED, THORACOSCOPIC
Anesthesia: General | Site: Chest | Laterality: Left

## 2019-09-07 MED ORDER — SENNOSIDES-DOCUSATE SODIUM 8.6-50 MG PO TABS
1.0000 | ORAL_TABLET | Freq: Every day | ORAL | Status: DC
  Administered 2019-09-07 – 2019-09-10 (×4): 1 via ORAL
  Filled 2019-09-07 (×4): qty 1

## 2019-09-07 MED ORDER — SODIUM CHLORIDE 0.9 % IV SOLN: INTRAVENOUS | Status: DC | PRN

## 2019-09-07 MED ORDER — LACTATED RINGERS IV SOLN: INTRAVENOUS | Status: DC | PRN

## 2019-09-07 MED ORDER — CHLORHEXIDINE GLUCONATE 0.12 % MT SOLN
OROMUCOSAL | Status: AC
  Administered 2019-09-07: 15 mL via OROMUCOSAL
  Filled 2019-09-07: qty 15

## 2019-09-07 MED ORDER — ATORVASTATIN CALCIUM 80 MG PO TABS
80.0000 mg | ORAL_TABLET | Freq: Every day | ORAL | Status: DC
  Administered 2019-09-08 – 2019-09-11 (×4): 80 mg via ORAL
  Filled 2019-09-07 (×4): qty 1

## 2019-09-07 MED ORDER — INSULIN ASPART 100 UNIT/ML ~~LOC~~ SOLN
0.0000 [IU] | Freq: Three times a day (TID) | SUBCUTANEOUS | Status: DC
  Administered 2019-09-08 (×2): 2 [IU] via SUBCUTANEOUS

## 2019-09-07 MED ORDER — LUNG SURGERY BOOK
Freq: Once | Status: AC
  Administered 2019-09-07: 1
  Filled 2019-09-07: qty 1

## 2019-09-07 MED ORDER — SUGAMMADEX SODIUM 200 MG/2ML IV SOLN
INTRAVENOUS | Status: DC | PRN
Start: 2019-09-07 — End: 2019-09-07
  Administered 2019-09-07: 150 mg via INTRAVENOUS

## 2019-09-07 MED ORDER — LACTATED RINGERS IV SOLN
INTRAVENOUS | Status: DC | PRN
Start: 2019-09-07 — End: 2019-09-07

## 2019-09-07 MED ORDER — ONDANSETRON HCL 4 MG/2ML IJ SOLN: 4.0000 mg | Freq: Once | INTRAMUSCULAR | Status: DC | PRN

## 2019-09-07 MED ORDER — HYDROMORPHONE HCL 1 MG/ML IJ SOLN: 0.2500 mg | INTRAMUSCULAR | Status: DC | PRN

## 2019-09-07 MED ORDER — LIDOCAINE 2% (20 MG/ML) 5 ML SYRINGE
INTRAMUSCULAR | Status: DC | PRN
  Administered 2019-09-07: 80 mg via INTRAVENOUS

## 2019-09-07 MED ORDER — ONDANSETRON HCL 4 MG/2ML IJ SOLN: 4.0000 mg | Freq: Four times a day (QID) | INTRAMUSCULAR | Status: DC | PRN

## 2019-09-07 MED ORDER — PROPOFOL 10 MG/ML IV BOLUS
INTRAVENOUS | Status: DC | PRN
  Administered 2019-09-07: 140 mg via INTRAVENOUS

## 2019-09-07 MED ORDER — ENOXAPARIN SODIUM 40 MG/0.4ML ~~LOC~~ SOLN: 40.0000 mg | Freq: Every day | SUBCUTANEOUS | Status: DC

## 2019-09-07 MED ORDER — LACTATED RINGERS IV SOLN: INTRAVENOUS | Status: DC

## 2019-09-07 MED ORDER — ACETAMINOPHEN 500 MG PO TABS
1000.0000 mg | ORAL_TABLET | Freq: Four times a day (QID) | ORAL | Status: DC
  Administered 2019-09-08 – 2019-09-10 (×9): 1000 mg via ORAL
  Filled 2019-09-07 (×10): qty 2

## 2019-09-07 MED ORDER — BUPIVACAINE HCL (PF) 0.5 % IJ SOLN
INTRAMUSCULAR | Status: AC
  Filled 2019-09-07: qty 30

## 2019-09-07 MED ORDER — ENOXAPARIN SODIUM 30 MG/0.3ML ~~LOC~~ SOLN
30.0000 mg | SUBCUTANEOUS | Status: DC
  Administered 2019-09-08 – 2019-09-11 (×4): 30 mg via SUBCUTANEOUS
  Filled 2019-09-07 (×4): qty 0.3

## 2019-09-07 MED ORDER — TRAMADOL HCL 50 MG PO TABS
50.0000 mg | ORAL_TABLET | Freq: Four times a day (QID) | ORAL | Status: DC | PRN
  Administered 2019-09-07 – 2019-09-09 (×4): 50 mg via ORAL
  Filled 2019-09-07 (×4): qty 1

## 2019-09-07 MED ORDER — SODIUM CHLORIDE FLUSH 0.9 % IV SOLN
INTRAVENOUS | Status: DC | PRN
  Administered 2019-09-07: 100 mL

## 2019-09-07 MED ORDER — CEFAZOLIN SODIUM-DEXTROSE 2-3 GM-%(50ML) IV SOLR
INTRAVENOUS | Status: DC | PRN
Start: 2019-09-07 — End: 2019-09-07
  Administered 2019-09-07: 2 g via INTRAVENOUS

## 2019-09-07 MED ORDER — ORAL CARE MOUTH RINSE: 15.0000 mL | Freq: Once | OROMUCOSAL | Status: AC

## 2019-09-07 MED ORDER — ACETAMINOPHEN 160 MG/5ML PO SOLN
1000.0000 mg | Freq: Four times a day (QID) | ORAL | Status: DC
  Administered 2019-09-08 – 2019-09-11 (×3): 1000 mg via ORAL
  Filled 2019-09-07 (×3): qty 40.6

## 2019-09-07 MED ORDER — FENTANYL CITRATE (PF) 250 MCG/5ML IJ SOLN
INTRAMUSCULAR | Status: AC
  Filled 2019-09-07: qty 5

## 2019-09-07 MED ORDER — PANTOPRAZOLE SODIUM 40 MG PO TBEC
40.0000 mg | DELAYED_RELEASE_TABLET | Freq: Every day | ORAL | Status: DC
  Administered 2019-09-08 – 2019-09-11 (×4): 40 mg via ORAL
  Filled 2019-09-07 (×4): qty 1

## 2019-09-07 MED ORDER — BUPIVACAINE LIPOSOME 1.3 % IJ SUSP
20.0000 mL | Freq: Once | INTRAMUSCULAR | Status: DC
  Filled 2019-09-07: qty 20

## 2019-09-07 MED ORDER — GLUCERNA SHAKE PO LIQD
237.0000 mL | Freq: Three times a day (TID) | ORAL | Status: DC
  Administered 2019-09-07 – 2019-09-11 (×7): 237 mL via ORAL

## 2019-09-07 MED ORDER — CHLORHEXIDINE GLUCONATE CLOTH 2 % EX PADS
6.0000 | MEDICATED_PAD | Freq: Every day | CUTANEOUS | Status: DC
  Administered 2019-09-08 – 2019-09-10 (×2): 6 via TOPICAL

## 2019-09-07 MED ORDER — FENTANYL CITRATE (PF) 100 MCG/2ML IJ SOLN
INTRAMUSCULAR | Status: DC | PRN
  Administered 2019-09-07 (×2): 50 ug via INTRAVENOUS
  Administered 2019-09-07: 150 ug via INTRAVENOUS

## 2019-09-07 MED ORDER — CHLORHEXIDINE GLUCONATE 0.12 % MT SOLN: 15.0000 mL | Freq: Once | OROMUCOSAL | Status: AC

## 2019-09-07 MED ORDER — ONDANSETRON HCL 4 MG/2ML IJ SOLN
INTRAMUSCULAR | Status: DC | PRN
  Administered 2019-09-07: 4 mg via INTRAVENOUS

## 2019-09-07 MED ORDER — MIDAZOLAM HCL 2 MG/2ML IJ SOLN
INTRAMUSCULAR | Status: AC
  Filled 2019-09-07: qty 2

## 2019-09-07 MED ORDER — ASPIRIN EC 81 MG PO TBEC
81.0000 mg | DELAYED_RELEASE_TABLET | Freq: Every day | ORAL | Status: DC
  Administered 2019-09-08 – 2019-09-11 (×4): 81 mg via ORAL
  Filled 2019-09-07 (×4): qty 1

## 2019-09-07 MED ORDER — SODIUM CHLORIDE 0.9 % IR SOLN
Status: DC | PRN
  Administered 2019-09-07: 1000 mL

## 2019-09-07 MED ORDER — OXYCODONE HCL 5 MG PO TABS
5.0000 mg | ORAL_TABLET | ORAL | Status: DC | PRN
  Administered 2019-09-07: 5 mg via ORAL
  Filled 2019-09-07 (×2): qty 1

## 2019-09-07 MED ORDER — ROCURONIUM BROMIDE 10 MG/ML (PF) SYRINGE
PREFILLED_SYRINGE | INTRAVENOUS | Status: DC | PRN
  Administered 2019-09-07 (×2): 20 mg via INTRAVENOUS
  Administered 2019-09-07: 60 mg via INTRAVENOUS
  Administered 2019-09-07: 30 mg via INTRAVENOUS

## 2019-09-07 MED ORDER — PROPOFOL 10 MG/ML IV BOLUS
INTRAVENOUS | Status: AC
  Filled 2019-09-07: qty 40

## 2019-09-07 MED ORDER — HEMOSTATIC AGENTS (NO CHARGE) OPTIME
TOPICAL | Status: DC | PRN
  Administered 2019-09-07: 1 via TOPICAL

## 2019-09-07 MED ORDER — BISACODYL 5 MG PO TBEC
10.0000 mg | DELAYED_RELEASE_TABLET | Freq: Every day | ORAL | Status: DC
  Administered 2019-09-08 – 2019-09-11 (×4): 10 mg via ORAL
  Filled 2019-09-07 (×5): qty 2

## 2019-09-07 MED ORDER — CEFAZOLIN SODIUM-DEXTROSE 2-4 GM/100ML-% IV SOLN
2.0000 g | Freq: Three times a day (TID) | INTRAVENOUS | Status: AC
  Administered 2019-09-07 – 2019-09-08 (×2): 2 g via INTRAVENOUS
  Filled 2019-09-07 (×2): qty 100

## 2019-09-07 MED ORDER — 0.9 % SODIUM CHLORIDE (POUR BTL) OPTIME
TOPICAL | Status: DC | PRN
  Administered 2019-09-07: 2000 mL

## 2019-09-07 SURGICAL SUPPLY — 127 items
ANCHOR CATH FOLEY SECURE (MISCELLANEOUS) ×2 IMPLANT
APPLIER CLIP ROT 10 11.4 M/L (STAPLE)
BLADE CLIPPER SURG (BLADE) ×2 IMPLANT
BLADE SURG 11 STRL SS (BLADE) ×2 IMPLANT
BNDG COHESIVE 6X5 TAN STRL LF (GAUZE/BANDAGES/DRESSINGS) ×2 IMPLANT
CANISTER SUCT 3000ML PPV (MISCELLANEOUS) ×4 IMPLANT
CANNULA REDUC XI 12-8 STAPL (CANNULA) ×2
CANNULA REDUCER 12-8 DVNC XI (CANNULA) ×2 IMPLANT
CATH THORACIC 28FR (CATHETERS) IMPLANT
CATH THORACIC 28FR RT ANG (CATHETERS) IMPLANT
CATH THORACIC 36FR (CATHETERS) IMPLANT
CATH THORACIC 36FR RT ANG (CATHETERS) IMPLANT
CATH TROCAR 20FR (CATHETERS) IMPLANT
CHLORAPREP W/TINT 26 (MISCELLANEOUS) ×2 IMPLANT
CLIP APPLIE ROT 10 11.4 M/L (STAPLE) IMPLANT
CLIP VESOCCLUDE MED 6/CT (CLIP) IMPLANT
CNTNR URN SCR LID CUP LEK RST (MISCELLANEOUS) ×2 IMPLANT
CONN ST 1/4X3/8  BEN (MISCELLANEOUS)
CONN ST 1/4X3/8 BEN (MISCELLANEOUS) IMPLANT
CONT SPEC 4OZ STRL OR WHT (MISCELLANEOUS) ×2
COVER SURGICAL LIGHT HANDLE (MISCELLANEOUS) IMPLANT
DEFOGGER SCOPE WARMER CLEARIFY (MISCELLANEOUS) ×2 IMPLANT
DERMABOND ADVANCED (GAUZE/BANDAGES/DRESSINGS) ×1
DERMABOND ADVANCED .7 DNX12 (GAUZE/BANDAGES/DRESSINGS) ×1 IMPLANT
DISSECTOR BLUNT TIP ENDO 5MM (MISCELLANEOUS) IMPLANT
DRAIN CHANNEL 28F RND 3/8 FF (WOUND CARE) IMPLANT
DRAIN CHANNEL 32F RND 10.7 FF (WOUND CARE) IMPLANT
DRAPE ARM DVNC X/XI (DISPOSABLE) ×4 IMPLANT
DRAPE COLUMN DVNC XI (DISPOSABLE) ×1 IMPLANT
DRAPE CV SPLIT W-CLR ANES SCRN (DRAPES) ×2 IMPLANT
DRAPE DA VINCI XI ARM (DISPOSABLE) ×4
DRAPE DA VINCI XI COLUMN (DISPOSABLE) ×1
DRAPE ORTHO SPLIT 77X108 STRL (DRAPES) ×1
DRAPE SURG ORHT 6 SPLT 77X108 (DRAPES) ×1 IMPLANT
DRAPE WARM FLUID 44X44 (DRAPES) ×2 IMPLANT
ELECT BLADE 4.0 EZ CLEAN MEGAD (MISCELLANEOUS) ×2
ELECT BLADE 6.5 EXT (BLADE) IMPLANT
ELECT REM PT RETURN 9FT ADLT (ELECTROSURGICAL) ×2
ELECTRODE BLDE 4.0 EZ CLN MEGD (MISCELLANEOUS) ×1 IMPLANT
ELECTRODE REM PT RTRN 9FT ADLT (ELECTROSURGICAL) ×1 IMPLANT
GAUZE KITTNER 4X5 RF (MISCELLANEOUS) ×2 IMPLANT
GAUZE KITTNER 4X8 (MISCELLANEOUS) ×2 IMPLANT
GAUZE SPONGE 4X4 12PLY STRL (GAUZE/BANDAGES/DRESSINGS) ×2 IMPLANT
GLOVE BIO SURGEON STRL SZ 6.5 (GLOVE) ×2 IMPLANT
GLOVE BIO SURGEON STRL SZ7 (GLOVE) ×4 IMPLANT
GLOVE BIO SURGEON STRL SZ7.5 (GLOVE) ×4 IMPLANT
GLOVE ECLIPSE 6.0 STRL STRAW (GLOVE) ×2 IMPLANT
GLOVE SURG SS PI 6.5 STRL IVOR (GLOVE) ×2 IMPLANT
GLOVE SURG SS PI 8.0 STRL IVOR (GLOVE) ×6 IMPLANT
GOWN STRL NON-REIN LRG LVL3 (GOWN DISPOSABLE) ×2 IMPLANT
GOWN STRL REUS W/ TWL LRG LVL3 (GOWN DISPOSABLE) ×2 IMPLANT
GOWN STRL REUS W/ TWL XL LVL3 (GOWN DISPOSABLE) ×3 IMPLANT
GOWN STRL REUS W/TWL 2XL LVL3 (GOWN DISPOSABLE) ×2 IMPLANT
GOWN STRL REUS W/TWL LRG LVL3 (GOWN DISPOSABLE) ×2
GOWN STRL REUS W/TWL XL LVL3 (GOWN DISPOSABLE) ×3
HEMOSTAT SURGICEL 2X14 (HEMOSTASIS) ×2 IMPLANT
IRRIGATION STRYKERFLOW (MISCELLANEOUS) ×1 IMPLANT
IRRIGATOR STRYKERFLOW (MISCELLANEOUS) ×2
KIT BASIN OR (CUSTOM PROCEDURE TRAY) ×2 IMPLANT
KIT SUCTION CATH 14FR (SUCTIONS) IMPLANT
KIT TURNOVER KIT B (KITS) ×2 IMPLANT
LOOP VESSEL SUPERMAXI WHITE (MISCELLANEOUS) IMPLANT
NEEDLE 22X1 1/2 (OR ONLY) (NEEDLE) ×2 IMPLANT
NEEDLE HYPO 25GX1X1/2 BEV (NEEDLE) ×4 IMPLANT
NS IRRIG 1000ML POUR BTL (IV SOLUTION) ×6 IMPLANT
OBTURATOR OPTICAL STANDARD 8MM (TROCAR) ×1
OBTURATOR OPTICAL STND 8 DVNC (TROCAR) ×1
OBTURATOR OPTICALSTD 8 DVNC (TROCAR) ×1 IMPLANT
PACK CHEST (CUSTOM PROCEDURE TRAY) ×2 IMPLANT
PAD ARMBOARD 7.5X6 YLW CONV (MISCELLANEOUS) ×10 IMPLANT
POUCH ENDO CATCH II 15MM (MISCELLANEOUS) IMPLANT
POUCH RETRIEVAL ECOSAC 10 (ENDOMECHANICALS) IMPLANT
POUCH RETRIEVAL ECOSAC 10MM (ENDOMECHANICALS)
POUCH SPECIMEN RETRIEVAL 10MM (ENDOMECHANICALS) IMPLANT
RELOAD STAPLER 3.5X45 BLU DVNC (STAPLE) ×1 IMPLANT
RETRACTOR WOUND ALXS 19CM XSML (INSTRUMENTS) IMPLANT
RTRCTR WOUND ALEXIS 19CM XSML (INSTRUMENTS)
SCISSORS LAP 5X35 DISP (ENDOMECHANICALS) IMPLANT
SEAL CANN UNIV 5-8 DVNC XI (MISCELLANEOUS) ×2 IMPLANT
SEAL XI 5MM-8MM UNIVERSAL (MISCELLANEOUS) ×2
SEALANT PROGEL (MISCELLANEOUS) IMPLANT
SEALANT SURG COSEAL 4ML (VASCULAR PRODUCTS) IMPLANT
SEALANT SURG COSEAL 8ML (VASCULAR PRODUCTS) IMPLANT
SEALER LIGASURE MARYLAND 30 (ELECTROSURGICAL) IMPLANT
SET TUBE SMOKE EVAC HIGH FLOW (TUBING) ×2 IMPLANT
SOLUTION ELECTROLUBE (MISCELLANEOUS) ×2 IMPLANT
SPECIMEN JAR MEDIUM (MISCELLANEOUS) IMPLANT
SPONGE INTESTINAL PEANUT (DISPOSABLE) IMPLANT
SPONGE TONSIL TAPE 1 RFD (DISPOSABLE) ×2 IMPLANT
STAPLER 45 SUREFORM CVD (STAPLE) ×1
STAPLER 45 SUREFORM CVD DVNC (STAPLE) ×1 IMPLANT
STAPLER CANNULA SEAL DVNC XI (STAPLE) ×2 IMPLANT
STAPLER CANNULA SEAL XI (STAPLE) ×2
STAPLER RELOAD 3.5X45 BLU DVNC (STAPLE) ×1
STAPLER RELOAD 3.5X45 BLUE (STAPLE) ×1
STOPCOCK 4 WAY LG BORE MALE ST (IV SETS) ×2 IMPLANT
SUT MNCRL AB 3-0 PS2 18 (SUTURE) IMPLANT
SUT MON AB 2-0 CT1 36 (SUTURE) IMPLANT
SUT PDS AB 1 CTX 36 (SUTURE) IMPLANT
SUT PROLENE 4 0 RB 1 (SUTURE)
SUT PROLENE 4-0 RB1 .5 CRCL 36 (SUTURE) IMPLANT
SUT SILK  1 MH (SUTURE) ×2
SUT SILK 1 MH (SUTURE) ×2 IMPLANT
SUT SILK 1 TIES 10X30 (SUTURE) IMPLANT
SUT SILK 2 0 SH (SUTURE) IMPLANT
SUT SILK 2 0SH CR/8 30 (SUTURE) IMPLANT
SUT VIC AB 1 CTX 36 (SUTURE)
SUT VIC AB 1 CTX36XBRD ANBCTR (SUTURE) IMPLANT
SUT VIC AB 2-0 CT1 27 (SUTURE) ×1
SUT VIC AB 2-0 CT1 TAPERPNT 27 (SUTURE) ×1 IMPLANT
SUT VIC AB 3-0 SH 27 (SUTURE) ×3
SUT VIC AB 3-0 SH 27X BRD (SUTURE) ×3 IMPLANT
SUT VICRYL 0 TIES 12 18 (SUTURE) ×2 IMPLANT
SUT VICRYL 0 UR6 27IN ABS (SUTURE) ×4 IMPLANT
SUT VICRYL 2 TP 1 (SUTURE) IMPLANT
SYR 10ML LL (SYRINGE) ×2 IMPLANT
SYR 20ML LL LF (SYRINGE) ×2 IMPLANT
SYR 50ML LL SCALE MARK (SYRINGE) ×2 IMPLANT
SYSTEM SAHARA CHEST DRAIN ATS (WOUND CARE) ×2 IMPLANT
TAPE CLOTH 4X10 WHT NS (GAUZE/BANDAGES/DRESSINGS) ×2 IMPLANT
TIP APPLICATOR SPRAY EXTEND 16 (VASCULAR PRODUCTS) IMPLANT
TOWEL GREEN STERILE (TOWEL DISPOSABLE) ×4 IMPLANT
TRAY FOLEY MTR SLVR 16FR STAT (SET/KITS/TRAYS/PACK) ×2 IMPLANT
TROCAR BLADELESS 15MM (ENDOMECHANICALS) IMPLANT
TROCAR XCEL 12X100 BLDLESS (ENDOMECHANICALS) ×2 IMPLANT
TUBING EXTENTION W/L.L. (IV SETS) ×2 IMPLANT
WATER STERILE IRR 1000ML POUR (IV SOLUTION) ×2 IMPLANT

## 2019-09-07 NOTE — Progress Notes (Signed)
      [address]       [name] [address]             [phone]    Pre Procedure note for inpatients:   Randall Patel has been scheduled for Procedure(s): XI ROBOTIC ASSISTED THORASCOPY-WEDGE RESECTION, apical pleurectomy (Left) today. The various methods of treatment have been discussed with the patient. After consideration of the risks, benefits and treatment options the patient has consented to the planned procedure.   The patient has been seen and labs reviewed. There are no changes in the patient's condition to prevent proceeding with the planned procedure today.  Recent labs:  Lab Results  Component Value Date   WBC 7.1 09/05/2019   HGB 11.7 (L) 09/05/2019   HCT 36.8 (L) 09/05/2019   PLT 234 09/05/2019   GLUCOSE 94 09/05/2019   CHOL 148 03/20/2019   TRIG 40 03/20/2019   HDL 79 03/20/2019   LDLCALC 59 03/20/2019   ALT 20 08/24/2019   AST 18 08/24/2019   NA 133 (L) 09/05/2019   K 4.1 09/05/2019   CL 98 09/05/2019   CREATININE 0.89 09/05/2019   BUN 12 09/05/2019   CO2 24 09/05/2019   TSH 1.020 03/20/2019   PSA 1.42 Test Methodology: [employer] PSA 03/11/2009   INR 1.0 08/24/2019   HGBA1C 5.6 04/02/2018   MICROALBUR 1.58 03/11/2009    I have discussed with the patient proceeding with planned procedure - Dr Cliffton Asters discussed with him procedure this am also- Patient agreeable with proceeding as soon as possible   The goals risks and alternatives of the planned surgical procedure Procedure(s): XI ROBOTIC ASSISTED THORASCOPY-WEDGE RESECTION, apical pleurectomy (Left)  have been discussed with the patient in detail. The risks of the procedure including death, infection, stroke, myocardial infarction, bleeding, blood transfusion have all been discussed specifically.  I have quoted Randall Patel a 2 % of perioperative mortality and a complication rate as high as 40 %. The patient's questions have been answered.Randall Patel is willing  to proceed with  the planned procedure.  Randall Ovens, MD 09/07/2019 11:39 AM

## 2019-09-07 NOTE — Progress Notes (Signed)
     [address]       [place] [address]             [phone]       Case discussed and reviewed with Dr. Donata Clay.  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  We discussed the risks and benefits of proceeding with a robotic assisted left thoracoscopy, wedge resection and apical pleurectomy.  He is agreeable to proceed.  Randall Patel

## 2019-09-07 NOTE — Anesthesia Preprocedure Evaluation (Signed)
Anesthesia Evaluation  Patient identified by MRN, date of birth, ID band Patient awake    Reviewed: Allergy & Precautions, NPO status , Patient's Chart, lab work & pertinent test results  Airway Mallampati: II  TM Distance: >3 FB Neck ROM: Full    Dental  (+) Poor Dentition, Dental Advisory Given   Pulmonary COPD,  COPD inhaler, Current SmokerPatient did not abstain from smoking.,  Recurrent Left pneumothorax   Pulmonary exam normal breath sounds clear to auscultation       Cardiovascular hypertension, Pt. on medications + angina with exertion + CAD, + Past MI and +CHF  Normal cardiovascular exam Rhythm:Regular Rate:Normal  Hx/o Cardiac arrest with PEA, successfully resuscitated.  Echo 04/03/18 Left ventricle: Diffuse hypokinesis worse in the inferor wall. The cavity size was moderately dilated. Wall thickness was normal. Systolic function was moderately reduced. The estimated ejection fraction was in the range of 35% to 40%. Left ventricular diastolic function parameters were normal.  - Mitral valve: Calcified annulus. Mildly thickened leaflets .  - Atrial septum: No defect or patent foramen ovale was identified.   EKG 09/02/19 NSR, LVH, inferior infarct   Neuro/Psych Seizures -, Well Controlled,  PSYCHIATRIC DISORDERS CVA, Residual Symptoms    GI/Hepatic negative GI ROS, (+)     substance abuse  alcohol use,   Endo/Other  Hyperlipidemia  Renal/GU negative Renal ROS  negative genitourinary   Musculoskeletal  (+) Arthritis , Osteoarthritis,    Abdominal   Peds  Hematology  (+) anemia ,   Anesthesia Other Findings   Reproductive/Obstetrics                             Anesthesia Physical Anesthesia Plan  ASA: III  Anesthesia Plan: General   Post-op Pain Management:    Induction: Intravenous  PONV Risk Score and Plan: 2 and Ondansetron and Treatment may vary due to age or medical  condition  Airway Management Planned: Double Lumen EBT  Additional Equipment: Arterial line  Intra-op Plan:   Post-operative Plan: Extubation in OR  Informed Consent: I have reviewed the patients History and Physical, chart, labs and discussed the procedure including the risks, benefits and alternatives for the proposed anesthesia with the patient or authorized representative who has indicated his/her understanding and acceptance.     Dental advisory given  Plan Discussed with: CRNA and Surgeon  Anesthesia Plan Comments:         Anesthesia Quick Evaluation

## 2019-09-07 NOTE — Anesthesia Postprocedure Evaluation (Signed)
Anesthesia Post Note  Patient: Randall Patel  Procedure(s) Performed: XI ROBOTIC ASSISTED THORASCOPY-WEDGE RESECTION-Apical Blebs (Left Chest) Apical Pleurectomy (Left Chest) Mechanical Pleuradesis (Left Chest) Intercostal Nerve Block (Left Chest)     Patient location during evaluation: PACU Anesthesia Type: General Level of consciousness: awake and alert and oriented Pain management: pain level controlled Vital Signs Assessment: post-procedure vital signs reviewed and stable Respiratory status: spontaneous breathing, nonlabored ventilation, respiratory function stable and patient connected to nasal cannula oxygen Cardiovascular status: blood pressure returned to baseline and stable Postop Assessment: no apparent nausea or vomiting Anesthetic complications: no    Last Vitals:  Vitals:   09/07/19 1538 09/07/19 1558  BP: (!) 155/96 (!) 146/99  Pulse:  99  Resp: (!) 22 18  Temp: (!) 36.1 C   SpO2: 93% 100%    Last Pain:  Vitals:   09/07/19 1538  TempSrc:   PainSc: 0-No pain                 Deryck Hippler A.

## 2019-09-07 NOTE — Anesthesia Procedure Notes (Signed)
Procedure Name: Intubation Date/Time: 09/07/2019 12:11 PM Performed by: Griffin Dakin, CRNA Pre-anesthesia Checklist: Patient identified, Emergency Drugs available, Suction available and Patient being monitored Patient Re-evaluated:Patient Re-evaluated prior to induction Oxygen Delivery Method: Circle system utilized Preoxygenation: Pre-oxygenation with 100% oxygen Induction Type: IV induction Ventilation: Mask ventilation without difficulty and Oral airway inserted - appropriate to patient size Laryngoscope Size: Mac and 4 Grade View: Grade I Tube type: Oral Endobronchial tube: Double lumen EBT, Left, EBT position confirmed by auscultation and EBT position confirmed by fiberoptic bronchoscope and 37 Fr Number of attempts: 1 Airway Equipment and Method: Stylet and Oral airway Placement Confirmation: ETT inserted through vocal cords under direct vision,  positive ETCO2 and breath sounds checked- equal and bilateral Secured at: 27 cm Tube secured with: Tape Dental Injury: Teeth and Oropharynx as per pre-operative assessment

## 2019-09-07 NOTE — Transfer of Care (Signed)
Immediate Anesthesia Transfer of Care Note  Patient: Randall Patel  Procedure(s) Performed: XI ROBOTIC ASSISTED THORASCOPY-WEDGE RESECTION-Apical Blebs (Left Chest) Apical Pleurectomy (Left Chest) Mechanical Pleuradesis (Left Chest) Intercostal Nerve Block (Left Chest)  Patient Location: PACU  Anesthesia Type:General  Level of Consciousness: awake, alert  and oriented  Airway & Oxygen Therapy: Patient Spontanous Breathing and Patient connected to face mask oxygen  Post-op Assessment: Report given to RN and Post -op Vital signs reviewed and stable  Post vital signs: Reviewed and stable  Last Vitals:  Vitals Value Taken Time  BP 155/96 09/07/19 1538  Temp    Pulse 97 09/07/19 1543  Resp 23 09/07/19 1544  SpO2 93 % 09/07/19 1543  Vitals shown include unvalidated device data.  Last Pain:  Vitals:   09/07/19 1000  TempSrc: Oral  PainSc: 0-No pain      Patients Stated Pain Goal: 3 (09/02/19 0550)  Complications: No apparent anesthesia complications

## 2019-09-07 NOTE — Anesthesia Procedure Notes (Signed)
Arterial Line Insertion Start/End5/28/2021 11:15 AM, 09/07/2019 11:30 AM Performed by: Mal Amabile, MD, Macie Burows, CRNA, CRNA  Patient location: Pre-op. Preanesthetic checklist: patient identified, IV checked, site marked, risks and benefits discussed, surgical consent, monitors and equipment checked, pre-op evaluation, timeout performed and anesthesia consent Lidocaine 1% used for infiltration Right, radial was placed Catheter size: 20 Fr Hand hygiene performed  and maximum sterile barriers used   Attempts: 1 Procedure performed without using ultrasound guided technique. Following insertion, dressing applied. Post procedure assessment: normal and unchanged  Patient tolerated the procedure well with no immediate complications.

## 2019-09-07 NOTE — Plan of Care (Signed)

## 2019-09-07 NOTE — Brief Op Note (Addendum)
      [address]       [name] [address]             [phone]     09/07/2019  3:17 PM  PATIENT:  Randall Patel  69 y.o. male  PRE-OPERATIVE DIAGNOSIS:  recurrent pneumothorax- left   POST-OPERATIVE DIAGNOSIS:  same  PROCEDURE:  Procedure(s): XI ROBOTIC ASSISTED THORASCOPY-WEDGE RESECTION-Apical Blebs (Left) Apical Pleurectomy (Left) Mechanical Pleuradesis (Left) Intercostal Nerve Block (Left)  SURGEON:  Surgeon(s) and Role:    * Delight Ovens, MD - Primary  PHYSICIAN ASSISTANT:  Jari Favre, PA-C  ANESTHESIA:   general  EBL:  200 mL   BLOOD ADMINISTERED:none  DRAINS: ONE BLAKE DRAIN   LOCAL MEDICATIONS USED:  BUPIVICAINE   SPECIMEN:  Source of Specimen:  APICAL LEFT BLEB WEDGE RESECTIONS and pleurectomy   DISPOSITION OF SPECIMEN:  PATHOLOGY  COUNTS:  YES  DICTATION: .Dragon Dictation  PLAN OF CARE: Admit to inpatient   PATIENT DISPOSITION:  PACU - hemodynamically stable.   Delay start of Pharmacological VTE agent (>24hrs) due to surgical blood loss or risk of bleeding: yes

## 2019-09-07 NOTE — Progress Notes (Addendum)
      [address]       [name] [address]             [phone]           Subjective: Patient has complaints of not much appetite. He denies abdominal pain or nausea.  Objective: Vital signs in last 24 hours: Temp:  [98 F (36.7 C)-98.5 F (36.9 C)] 98.3 F (36.8 C) (05/28 0354) Pulse Rate:  [67-82] 77 (05/28 0354) Cardiac Rhythm: Normal sinus rhythm (05/27 2000) Resp:  [19-22] 20 (05/28 0354) BP: (122-141)/(81-91) 128/91 (05/28 0354) SpO2:  [96 %-100 %] 100 % (05/28 0354)     Intake/Output from previous day: 05/27 0701 - 05/28 0700 In: 1643.6 [P.O.:840; I.V.:803.6] Out: 1500 [Urine:1500]   Physical Exam:  Cardiovascular: RRR Pulmonary: Diminished bilaterally Abdomen: Soft, non tender, bowel sounds present. Wounds: Dressing is clean and dry.     Lab Results: CBC: Recent Labs    09/05/19 0302  WBC 7.1  HGB 11.7*  HCT 36.8*  PLT 234   BMET:  Recent Labs    09/05/19 0302  NA 133*  K 4.1  CL 98  CO2 24  GLUCOSE 94  BUN 12  CREATININE 0.89  CALCIUM 9.3    PT/INR: No results for input(s): LABPROT, INR in the last 72 hours. ABG:  INR: Will add last result for INR, ABG once components are confirmed Will add last 4 CBG results once components are confirmed  Assessment/Plan:  1. CV - SR with HR in the 70's this am. On Coreg 3.125 mg bid. Patient was on Spironolactone and Irbesartan prior to admission but with previous hypotension will not resume at discharge. Patient has a follow up appointment to see his cardiologist next month.  2.  Pulmonary - On room air.  CXR this am appears relatively stable (tiny left apical and stable left basilar pneumothorax).  3. Mild hyponatremia-sodium yesterday 133 4. Anemia-Last H and H  stable at 11.7 and 36.8 5. Glucerna 6. As discussed with Dr. Donata Clay, patient to undergo robotic left VATS with Dr. Cliffton Asters later doay  Yareth Macdonnell M [name] 09/07/2019,7:42 AM [phone]

## 2019-09-08 ENCOUNTER — Inpatient Hospital Stay (HOSPITAL_COMMUNITY): Payer: Medicare HMO

## 2019-09-08 LAB — BLOOD GAS, ARTERIAL
Acid-base deficit: 0.2 mmol/L (ref 0.0–2.0)
Bicarbonate: 23.6 mmol/L (ref 20.0–28.0)
Drawn by: 35043
FIO2: 28
O2 Saturation: 98.5 %
Patient temperature: 36.6
pCO2 arterial: 35.9 mmHg (ref 32.0–48.0)
pH, Arterial: 7.431 (ref 7.350–7.450)
pO2, Arterial: 120 mmHg — ABNORMAL HIGH (ref 83.0–108.0)

## 2019-09-08 LAB — GLUCOSE, CAPILLARY
Glucose-Capillary: 99 mg/dL (ref 70–99)
Glucose-Capillary: 142 mg/dL — ABNORMAL HIGH (ref 70–99)
Glucose-Capillary: 125 mg/dL — ABNORMAL HIGH (ref 70–99)
Glucose-Capillary: 97 mg/dL (ref 70–99)

## 2019-09-08 LAB — CBC
HCT: 33 % — ABNORMAL LOW (ref 39.0–52.0)
Hemoglobin: 10.5 g/dL — ABNORMAL LOW (ref 13.0–17.0)
MCH: 27.2 pg (ref 26.0–34.0)
MCHC: 31.8 g/dL (ref 30.0–36.0)
MCV: 85.5 fL (ref 80.0–100.0)
Platelets: 250 10*3/uL (ref 150–400)
RBC: 3.86 MIL/uL — ABNORMAL LOW (ref 4.22–5.81)
RDW: 13.4 % (ref 11.5–15.5)
WBC: 9.4 10*3/uL (ref 4.0–10.5)
nRBC: 0 % (ref 0.0–0.2)

## 2019-09-08 LAB — BASIC METABOLIC PANEL
Anion gap: 11 (ref 5–15)
BUN: 12 mg/dL (ref 8–23)
CO2: 22 mmol/L (ref 22–32)
Calcium: 8.4 mg/dL — ABNORMAL LOW (ref 8.9–10.3)
Chloride: 100 mmol/L (ref 98–111)
Creatinine, Ser: 1.02 mg/dL (ref 0.61–1.24)
GFR calc Af Amer: >60 mL/min (ref >60)
GFR calc non Af Amer: >60 mL/min (ref >60)
Glucose, Bld: 148 mg/dL — ABNORMAL HIGH (ref 70–99)
Potassium: 3.9 mmol/L (ref 3.5–5.1)
Sodium: 133 mmol/L — ABNORMAL LOW (ref 135–145)

## 2019-09-08 MED ORDER — BISACODYL 10 MG RE SUPP
10.0000 mg | Freq: Every day | RECTAL | Status: DC | PRN
  Administered 2019-09-08: 10 mg via RECTAL
  Filled 2019-09-08: qty 1

## 2019-09-08 NOTE — Progress Notes (Addendum)
      [address]       [name] [address]             [phone]      1 Day Post-Op Procedure(s) (LRB): XI ROBOTIC ASSISTED THORASCOPY-WEDGE RESECTION-Apical Blebs (Left) Apical Pleurectomy (Left) Mechanical Pleuradesis (Left) Intercostal Nerve Block (Left) Subjective: Feels good this morning. Very diligent about using his incentive spirometer 10x an hour.  Objective: Vital signs in last 24 hours: Temp:  [97 F (36.1 C)-99 F (37.2 C)] 98.1 F (36.7 C) (05/29 0716) Pulse Rate:  [63-99] 67 (05/29 0839) Cardiac Rhythm: Normal sinus rhythm (05/29 0708) Resp:  [15-22] 18 (05/29 0716) BP: (96-155)/(68-99) 103/68 (05/29 0839) SpO2:  [93 %-100 %] 100 % (05/29 0716) Arterial Line BP: (70-178)/(46-89) 126/51 (05/29 0600)     Intake/Output from previous day: 05/28 0701 - 05/29 0700 In: 1837 [P.O.:237; I.V.:1500; IV Piggyback:100] Out: 985 [Urine:715; Blood:200; Chest Tube:70] Intake/Output this shift: Total I/O In: 3 [I.V.:3] Out: -   General appearance: alert, cooperative and no distress Heart: regular rate and rhythm, S1, S2 normal, no murmur, click, rub or gallop Lungs: clear to auscultation bilaterally Abdomen: soft, non-tender; bowel sounds normal; no masses,  no organomegaly Extremities: extremities normal, atraumatic, no cyanosis or edema Wound: clean and dry  Lab Results: Recent Labs    09/08/19 0431  WBC 9.4  HGB 10.5*  HCT 33.0*  PLT 250   BMET:  Recent Labs    09/08/19 0431  NA 133*  K 3.9  CL 100  CO2 22  GLUCOSE 148*  BUN 12  CREATININE 1.02  CALCIUM 8.4*    PT/INR: No results for input(s): LABPROT, INR in the last 72 hours. ABG    Component Value Date/Time   PHART 7.431 09/08/2019 0424   HCO3 23.6 09/08/2019 0424   TCO2 25 10/26/2016 2025   ACIDBASEDEF 0.2 09/08/2019 0424   O2SAT 98.5 09/08/2019 0424   CBG (last 3)  Recent Labs    09/07/19 1655 09/07/19 2135 09/08/19 0613  GLUCAP 87 88 99     Assessment/Plan: S/P Procedure(s) (LRB): XI ROBOTIC ASSISTED THORASCOPY-WEDGE RESECTION-Apical Blebs (Left) Apical Pleurectomy (Left) Mechanical Pleuradesis (Left) Intercostal Nerve Block (Left)  S/p robotic assisted thorascopy wedge resection and apical pleuroectomy for recurrent left pneumo. POD 1.    1. CV-NSR in the 60s, BP well controlled 2. Pulm- tolerating 2L [place]. CXR shows mild atelectasis  3. Renal-creatinine 1.02, electrolytes okay  4. H and H 10.5/33.0, expected acute blood loss anemia 5. Endo-blood glucose well controlled  Plan: Discontinue arterial line. 32ml/24 hours our of chest tube. No air leak. Continue chest tube for now. Continue to wean oxygen as tolerated. Encouraged to sit in the chair and ambulate around the halls. Foley catheter out tomorrow.    LOS: 15 days    Sharlene Dory 09/08/2019  Doing well Lung fully expanded Continue CT to WS  Irem Stoneham [employer]

## 2019-09-08 NOTE — Progress Notes (Signed)
Had to re-dress A-Line to get it to working again.  RN tried to draw back on line for morning labs and was having trouble.  Took off old dressing and got line to working and re-dressed it.

## 2019-09-08 NOTE — Plan of Care (Signed)
  Problem: Health Behavior/Discharge Planning: Goal: Ability to manage health-related needs will improve Outcome: Progressing   Problem: Clinical Measurements: Goal: Ability to maintain clinical measurements within normal limits will improve Outcome: Progressing   Problem: Clinical Measurements: Goal: Will remain free from infection Outcome: Progressing   Problem: Clinical Measurements: Goal: Respiratory complications will improve Outcome: Progressing   Problem: Nutrition: Goal: Adequate nutrition will be maintained Outcome: Progressing   Problem: Activity: Goal: Risk for activity intolerance will decrease Outcome: Progressing   Problem: Coping: Goal: Level of anxiety will decrease Outcome: Progressing   Problem: Pain Managment: Goal: General experience of comfort will improve Outcome: Progressing   Problem: Safety: Goal: Ability to remain free from injury will improve Outcome: Progressing   Problem: Skin Integrity: Goal: Risk for impaired skin integrity will decrease Outcome: Progressing

## 2019-09-09 ENCOUNTER — Inpatient Hospital Stay (HOSPITAL_COMMUNITY): Payer: Medicare HMO

## 2019-09-09 LAB — COMPREHENSIVE METABOLIC PANEL
ALT: 19 U/L (ref 0–44)
AST: 22 U/L (ref 15–41)
Albumin: 2.5 g/dL — ABNORMAL LOW (ref 3.5–5.0)
Alkaline Phosphatase: 41 U/L (ref 38–126)
Anion gap: 10 (ref 5–15)
BUN: 9 mg/dL (ref 8–23)
CO2: 25 mmol/L (ref 22–32)
Calcium: 8.6 mg/dL — ABNORMAL LOW (ref 8.9–10.3)
Chloride: 100 mmol/L (ref 98–111)
Creatinine, Ser: 0.87 mg/dL (ref 0.61–1.24)
GFR calc Af Amer: >60 mL/min (ref >60)
GFR calc non Af Amer: >60 mL/min (ref >60)
Glucose, Bld: 98 mg/dL (ref 70–99)
Potassium: 4 mmol/L (ref 3.5–5.1)
Sodium: 135 mmol/L (ref 135–145)
Total Bilirubin: 0.4 mg/dL (ref 0.3–1.2)
Total Protein: 5.8 g/dL — ABNORMAL LOW (ref 6.5–8.1)

## 2019-09-09 LAB — CBC
HCT: 32.1 % — ABNORMAL LOW (ref 39.0–52.0)
Hemoglobin: 10.3 g/dL — ABNORMAL LOW (ref 13.0–17.0)
MCH: 27.2 pg (ref 26.0–34.0)
MCHC: 32.1 g/dL (ref 30.0–36.0)
MCV: 84.9 fL (ref 80.0–100.0)
Platelets: 253 10*3/uL (ref 150–400)
RBC: 3.78 MIL/uL — ABNORMAL LOW (ref 4.22–5.81)
RDW: 13.5 % (ref 11.5–15.5)
WBC: 9.7 10*3/uL (ref 4.0–10.5)
nRBC: 0 % (ref 0.0–0.2)

## 2019-09-09 LAB — GLUCOSE, CAPILLARY
Glucose-Capillary: 85 mg/dL (ref 70–99)
Glucose-Capillary: 121 mg/dL — ABNORMAL HIGH (ref 70–99)
Glucose-Capillary: 96 mg/dL (ref 70–99)
Glucose-Capillary: 105 mg/dL — ABNORMAL HIGH (ref 70–99)

## 2019-09-09 NOTE — Progress Notes (Signed)
     [address]       [place] [address]             [phone]       No events Today's Vitals   09/08/19 1903 09/09/19 0000 09/09/19 0429 09/09/19 0733  BP: 114/77 118/79 109/67 123/76  Pulse: 94 88 69 92  Resp: (!) 29 (!) 23 20 17   Temp: 98 F (36.7 C) 99.3 F (37.4 C) 98 F (36.7 C) 97.9 F (36.6 C)  TempSrc: Oral Oral Oral Oral  SpO2: 94% 96% 97% 96%  Weight:      Height:      PainSc: 0-No pain Asleep Asleep    Body mass index is 23.03 kg/m.  Alert NAD Sinus No leak on CT  CXR shows well expanded lung Only out  69 yo male POD 2 s/p RATS, wedge resection, and apical pleurectomy Will remove CT today Continue pulm toilet dispo planning  Harrell O Lightfoot

## 2019-09-10 ENCOUNTER — Inpatient Hospital Stay (HOSPITAL_COMMUNITY): Payer: Medicare HMO

## 2019-09-10 LAB — GLUCOSE, CAPILLARY
Glucose-Capillary: 97 mg/dL (ref 70–99)
Glucose-Capillary: 102 mg/dL — ABNORMAL HIGH (ref 70–99)
Glucose-Capillary: 91 mg/dL (ref 70–99)
Glucose-Capillary: 95 mg/dL (ref 70–99)

## 2019-09-10 NOTE — Progress Notes (Signed)
Patient ID: Randall Patel, male   DOB: 10-31-1950, 69 y.o.   MRN: [id number] T[facility] DAILY ICU PROGRESS NOTE                   [address]            [place] [address]          [phone]   3 Days Post-Op Procedure(s) (LRB): XI ROBOTIC ASSISTED THORASCOPY-WEDGE RESECTION-Apical Blebs (Left) Apical Pleurectomy (Left) Mechanical Pleuradesis (Left) Intercostal Nerve Block (Left)  Total Length of Stay:  LOS: 17 days   Subjective: Alert , no complaints  Objective: Vital signs in last 24 hours: Temp:  [98.2 F (36.8 C)-101 F (38.3 C)] 99.3 F (37.4 C) (05/31 0703) Pulse Rate:  [71-94] 71 (05/31 0703) Cardiac Rhythm: Normal sinus rhythm (05/31 0703) Resp:  [17-30] 21 (05/31 0703) BP: (107-131)/(66-82) 123/78 (05/31 0703) SpO2:  [91 %-99 %] 97 % (05/31 0703)  Filed Weights   08/24/19 0240  Weight: 59 kg    Weight change:    Hemodynamic parameters for last 24 hours:    Intake/Output from previous day: 05/30 0701 - 05/31 0700 In: 720 [P.O.:720] Out: -   Intake/Output this shift: Total I/O In: -  Out: 250 [Urine:250]  Current Meds: Scheduled Meds: . acetaminophen  1,000 mg Oral Q6H   Or  . acetaminophen (TYLENOL) oral liquid 160 mg/5 mL  1,000 mg Oral Q6H  . aspirin EC  81 mg Oral Daily  . atorvastatin  80 mg Oral Daily  . bisacodyl  10 mg Oral Daily  . bupivacaine liposome  20 mL Infiltration Once  . carvedilol  3.125 mg Oral BID WC  . Chlorhexidine Gluconate Cloth  6 each Topical Daily  . docusate sodium  100 mg Oral BID  . enoxaparin (LOVENOX) injection  30 mg Subcutaneous Q24H  . famotidine  20 mg Oral BID  . feeding supplement (GLUCERNA SHAKE)  237 mL Oral TID WC  . fentaNYL (SUBLIMAZE) injection  50 mcg Intravenous Once  . insulin aspart  0-24 Units Subcutaneous TID AC & HS  . multivitamin with minerals  1 tablet Oral Daily  . pantoprazole  40 mg Oral Daily  . senna-docusate  1 tablet Oral QHS  . sodium chloride flush  3 mL  Intravenous Once  . sodium chloride flush  3 mL Intravenous Q12H   Continuous Infusions: . sodium chloride    . sodium chloride     PRN Meds:.[CANCELED] Place/Maintain arterial line **AND** sodium chloride, acetaminophen, bisacodyl, diphenhydrAMINE, guaiFENesin-dextromethorphan, lactulose, lip balm, ondansetron **OR** ondansetron (ZOFRAN) IV, oxyCODONE, sodium phosphate, traMADol  General appearance: alert and cooperative Neurologic: baseline after previous stroke  Heart: regular rate and rhythm, S1, S2 normal, no murmur, click, rub or gallop Lungs: diminished breath sounds bibasilar Abdomen: soft, non-tender; bowel sounds normal; no masses,  no organomegaly Extremities: extremities normal, atraumatic, no cyanosis or edema and Homans sign is negative, no sign of DVT Wound: intact   Lab Results: CBC: Recent Labs    09/08/19 0431 09/09/19 0224  WBC 9.4 9.7  HGB 10.5* 10.3*  HCT 33.0* 32.1*  PLT 250 253   BMET:  Recent Labs    09/08/19 0431 09/09/19 0224  NA 133* 135  K 3.9 4.0  CL 100 100  CO2 22 25  GLUCOSE 148* 98  BUN 12 9  CREATININE 1.02 0.87  CALCIUM 8.4* 8.6*    CMET: Lab Results  Component Value Date   WBC 9.7 09/09/2019  HGB 10.3 (L) 09/09/2019   HCT 32.1 (L) 09/09/2019   PLT 253 09/09/2019   GLUCOSE 98 09/09/2019   CHOL 148 03/20/2019   TRIG 40 03/20/2019   HDL 79 03/20/2019   LDLCALC 59 03/20/2019   ALT 19 09/09/2019   AST 22 09/09/2019   NA 135 09/09/2019   K 4.0 09/09/2019   CL 100 09/09/2019   CREATININE 0.87 09/09/2019   BUN 9 09/09/2019   CO2 25 09/09/2019   TSH 1.020 03/20/2019   PSA 1.42 Test Methodology: [employer] PSA 03/11/2009   INR 1.0 08/24/2019   HGBA1C 5.6 04/02/2018   MICROALBUR 1.58 03/11/2009      PT/INR: No results for input(s): LABPROT, INR in the last 72 hours. Radiology: DG Chest 1 View  Result Date: 09/10/2019 CLINICAL DATA:  Follow-up pneumothorax EXAM: CHEST  1 VIEW COMPARISON:  Chest radiograph from one day  prior. FINDINGS: Interval removal of left chest tube. Stable cardiomediastinal silhouette with top-normal heart size. Surgical sutures overlie the left lung apex. Small left apical pneumothorax is not substantially changed. No right pneumothorax. No right pleural effusion. Stable mild blunting of the left costophrenic angle. No overt pulmonary edema. Stable bibasilar atelectasis. IMPRESSION: 1. Stable small left apical pneumothorax status post left chest tube removal. 2. Stable bibasilar atelectasis. Electronically Signed   By: Delbert Phenix M.D.   On: 09/10/2019 07:31   DG Chest 1 View  Result Date: 09/09/2019 CLINICAL DATA:  S/p LEFT pleurectomy and pleurodesis. EXAM: CHEST  1 VIEW COMPARISON:  09/08/2019 FINDINGS: Cardiomediastinal silhouette is unchanged. A LEFT thoracostomy tube is again identified with tiny LEFT apical pneumothorax. Bibasilar atelectasis again noted. No other interval change noted. IMPRESSION: Tiny LEFT apical pneumothorax without other significant change. Bibasilar atelectasis. Electronically Signed   By: Harmon Pier M.D.   On: 09/09/2019 11:41     Assessment/Plan: S/P Procedure(s) (LRB): XI ROBOTIC ASSISTED THORASCOPY-WEDGE RESECTION-Apical Blebs (Left) Apical Pleurectomy (Left) Mechanical Pleuradesis (Left) Intercostal Nerve Block (Left) Mobilize  Will need home placement , was living with sister  Need to verify her ability to care for him - has recovered from procedure Friday quickly May need SNF    Delight Ovens 09/10/2019 9:02 AM

## 2019-09-11 ENCOUNTER — Inpatient Hospital Stay (HOSPITAL_COMMUNITY): Payer: Medicare HMO

## 2019-09-11 LAB — GLUCOSE, CAPILLARY
Glucose-Capillary: 82 mg/dL (ref 70–99)
Glucose-Capillary: 78 mg/dL (ref 70–99)

## 2019-09-11 LAB — SURGICAL PATHOLOGY

## 2019-09-11 NOTE — Progress Notes (Addendum)
      [address]       [place] [address]             [phone]       4 Days Post-Op Procedure(s) (LRB): XI ROBOTIC ASSISTED THORASCOPY-WEDGE RESECTION-Apical Blebs (Left) Apical Pleurectomy (Left) Mechanical Pleuradesis (Left) Intercostal Nerve Block (Left)  Subjective: Patient sleeping and awakened. He has no specific complaint this am.  Objective: Vital signs in last 24 hours: Temp:  [98.2 F (36.8 C)-98.5 F (36.9 C)] 98.3 F (36.8 C) (06/01 0728) Pulse Rate:  [72-93] 86 (06/01 0728) Cardiac Rhythm: Normal sinus rhythm (06/01 0728) Resp:  [18-29] 18 (06/01 0800) BP: (119-144)/(81-90) 134/85 (06/01 0728) SpO2:  [96 %-100 %] 98 % (06/01 0728)     Intake/Output from previous day: 05/31 0701 - 06/01 0700 In: 480 [P.O.:480] Out: 525 [Urine:525]   Physical Exam:  Cardiovascular: RRR Pulmonary: Slightly diminished bibasilar and left apeical breath sounds Abdomen: Soft, non tender, bowel sounds present. Extremities: No lower extremity edema. Wounds: Clean and dry.  No erythema or signs of infection.   Lab Results: CBC: Recent Labs    09/09/19 0224  WBC 9.7  HGB 10.3*  HCT 32.1*  PLT 253   BMET:  Recent Labs    09/09/19 0224  NA 135  K 4.0  CL 100  CO2 25  GLUCOSE 98  BUN 9  CREATININE 0.87  CALCIUM 8.6*    PT/INR: No results for input(s): LABPROT, INR in the last 72 hours. ABG:  INR: Will add last result for INR, ABG once components are confirmed Will add last 4 CBG results once components are confirmed  Assessment/Plan:  1. CV - SR 2.  Pulmonary - On room air. Check PA/LAT CXR. Encourage incentive spirometer 3. CBGs 91/95/82. Pre op HGA1C 5.6. No prior history of diabetes so stop accu checks and SS 4. Anemia-Last H and H stable at 10.3 and 32.1  5. Disposition to be determined. Apparently, patient lived with sister prior to surgery. If she is unable to care for him, he will need SNF  Lelon Huh  [facility] 09/11/2019,8:10 AM [phone] CXR today looks good w/o pntx Patient safe to go stay with sister DC instructions reviewed with patient patient examined and medical record reviewed,agree with above note. [name] 09/11/2019

## 2019-09-11 NOTE — Op Note (Signed)
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  SURGICAL PROCEDURE:  Left robotic-assisted wedge resection of apical blebs x2, apical pleurectomy with pleurodesis and intercostal nerve blocks with Exparel.  SURGEON:  Lanelle Bal, MD  FIRST ASSISTANT:  Nicholes Rough, PA  BRIEF HISTORY:  The patient is a 69 year old male who previously has had a stroke.  He presented with a left pneumothorax.  CT scan showed apical blebs.  He was treated with chest tube and then ultimately talc pleurodesis with slurry; however, after the  patient's chest tube was removed, before discharge, he developed recurrent pneumothorax.  Chest tube was replaced and we discussed with him proceeding with robotic-assisted wedge resection of apical blebs, pleurectomy.  The patient agreed and signed  informed consent.  DESCRIPTION OF PROCEDURE:  The patient underwent general endotracheal anesthesia without incident.  A double lumen endotracheal tube was placed without incident.  The in-tube camera confirmed good location.  The left lung was collapsed.  A left chest  tube was removed.  The patient was turned in lateral decubitus position with the left side up.  The left chest was prepped with ChloraPrep, allowed to dry and draped sterilely.  We then made 4 port sites with the 8 mm camera site anterior axillary line  approximately 8th intercostal space 4 cm anteriorly.  A 12 mm port was placed along the same intercostal space.  A 12 port was placed posteriorly and then an 8 port additional 4 cm posteriorly.  The initial port site was placed with Optiview 0-degree 5  mm scope, confirming entrance into the  pleural space.  CO2 inflation was then started.  With direct vision with the scope the remainder ports anteriorly and posteriorly were placed.  The port sites were infiltrated with Exparel prior to placement.  With  the ports all in place, the robot was then brought to the bedside and docked.  We then proceeded with the robotic portion of the case.  At the console the significant amount of pleural fluid was present inferiorly.  This was suctioned.  The lung was  loosely adherent to the anterior chest wall and to gain good visualization of the apex where CT scan confirmed apical blebs.  These adhesions were taken down.  We were then able to identify the area of what appeared to be a ruptured bleb at the apex.   Two wedge resections of this were done placing a blue load stapler through the more anterior port.  The specimens were removed through the stapling port and submitted to pathology for permanent section.  With some saline in the chest air leak test was  performed and there was no obvious air leak.  We then proceeded with an apical pleurectomy using the monopolar spatula instrument.  Apical portion of the pleura was removed and also brought out as a separate specimen.  With this completed, we then  undocked the robot using direct visualization with a handheld camera, combination of bupivacaine, Exparel and saline were injected along the posterior intercostal nerve bundles, approximately the 3rd to the 10th rib.  A single 28 chest tube was left in  place through the most anterior port.  The other 3 remaining ports were removed.  The port sites were not bleeding.  They were closed with 2-0 Vicryl suture in the deeper layers and a 3-0 subcuticular stitch.  Dermabond was applied.  The lung reinflated  nicely without air leak at the completion of the procedure.  The patient was awakened and extubated in the operating room and transferred to the recovery room for postoperative care.  Estimated blood loss was  minimal.  Sponge and needle count was  reported as correct at completion of procedure.  CN/NUANCE  D:09/10/2019 T:09/11/2019 JOB:[id number]

## 2019-09-11 NOTE — Discharge Instructions (Signed)
Thoracoscopy, Care After This sheet gives you information about how to care for yourself after your procedure. Your health care provider may also give you more specific instructions. If you have problems or questions, contact your health care provider. What can I expect after the procedure? After the procedure, it is common to have pain and soreness in the surgical area. Follow these instructions at home: Incision care   Follow instructions from your health care provider about how to take care of your incision. Make sure you: ? Wash your hands with soap and water before you change your bandage (dressing). If soap and water are not available, use hand sanitizer. ? Change your dressing as told by your health care provider. ? Leave stitches (sutures), skin glue, or adhesive strips in place. These skin closures may need to stay in place for 2 weeks or longer. If adhesive strip edges start to loosen and curl up, you may trim the loose edges. Do not remove adhesive strips completely unless your health care provider tells you to do that.  Check your incision areas every day for signs of infection. Check for: ? Redness, swelling, or pain. ? Fluid or blood. ? Warmth. ? Pus or a bad smell.  Do not take baths, swim, or use a hot tub until your health care provider approves. You may take showers. Medicines  Take over-the-counter and prescription medicines only as told by your health care provider.  If you were prescribed an antibiotic medicine, take it as told by your health care provider. Do not stop taking the antibiotic even if you start to feel better.  Do not drive or use heavy machinery while taking prescription pain medicine.  If you are taking prescription pain medicine, take actions to prevent or treat constipation. Your health care provider may recommend that you: ? Drink enough fluid to keep your urine pale yellow. ? Eat foods that are high in fiber, such as fresh fruits and vegetables,  whole grains, and beans. ? Limit foods that are high in fat and processed sugars, such as fried and sweet foods. ? Take an over-the-counter or prescription medicine for constipation. Managing pain, stiffness, and swelling   If directed, put ice on the affected area: ? Put ice in a plastic bag. ? Place a towel between your skin and the bag. ? Leave the ice on for 20 minutes, 2-3 times a day. Preventing lung infection  To prevent pneumonia and to keep your lungs healthy: ? Try to cough often. If it hurts to cough, hold a pillow against your chest as you cough. ? Take deep breaths or do breathing exercises as instructed by your health care provider. ? If you were given an incentive spirometer, use it as directed by your health care provider. General instructions  Do not lift anything that is heavier than 10 lb (4.5 kg), or the limit that you are told, until your health care provider says that it is safe.  Do not use any products that contain nicotine or tobacco, such as cigarettes and e-cigarettes. These can delay healing after surgery. If you need help quitting, ask your health care provider.  Avoid driving until your health care provider approves.  If you have a chest drainage tube, care for it as instructed by your health care provider. Do not travel by airplane after the chest drainage tube is removed until your health care provider approves.  Keep all follow-up visits as told by your health care provider. This is important. Contact   a health care provider if:  You have a fever.  Pain medicines do not ease your pain.  You have redness, swelling, or increasing pain in your incision area.  You develop a cough that does not go away, or you are coughing up mucus that is yellow or green. Get help right away if:  You have fluid, blood, or pus coming from your incision.  There is a bad smell coming from your incision or dressing.  You develop a rash.  You cough up blood.  You  develop light-headedness, or you feel faint.  You have difficulty breathing.  You develop chest pain.  Your heartbeat feels irregular or very fast. These symptoms may represent a serious problem that is an emergency. Do not wait to see if the symptoms will go away. Get medical help right away. Call your local emergency services (911 in the [place] Do not drive yourself to the hospital. Summary  Follow instructions from your health care provider about how to take care of your incision.  Do not drive or use heavy machinery while taking prescription pain medicine.  Leave stitches (sutures), skin glue, or adhesive strips in place.  Check your incision areas every day for signs of infection. This information is not intended to replace advice given to you by your health care provider. Make sure you discuss any questions you have with your health care provider. Document Revised: 03/11/2017 Document Reviewed: 03/08/2017 Elsevier Patient Education  2020 Elsevier Inc.  

## 2019-09-11 NOTE — Progress Notes (Addendum)
RN provided patient and sister Chance verbal discharge instructions at vehicle. RN provided paper copy of discharge summary. RN answered all questions. Pt VSS at discharge. IV removed. RN and NT discharge patient via wheelchair through [employer] entrance via wheelchair to private vehicle.

## 2019-09-12 ENCOUNTER — Ambulatory Visit: Payer: Medicare HMO | Admitting: Cardiothoracic Surgery

## 2019-09-19 ENCOUNTER — Ambulatory Visit: Payer: Self-pay | Admitting: Cardiothoracic Surgery

## 2019-09-19 ENCOUNTER — Other Ambulatory Visit: Payer: Self-pay

## 2019-09-19 ENCOUNTER — Ambulatory Visit
Admission: RE | Admit: 2019-09-19 | Discharge: 2019-09-19 | Disposition: A | Discharge status: Home / Self Care | Payer: Medicare HMO | Source: Ambulatory Visit | Attending: Cardiothoracic Surgery | Admitting: Cardiothoracic Surgery

## 2019-09-19 DIAGNOSIS — I25119 Atherosclerotic heart disease of native coronary artery with unspecified angina pectoris: Secondary | ICD-10-CM

## 2019-09-20 ENCOUNTER — Telehealth: Payer: Self-pay | Admitting: Cardiothoracic Surgery

## 2019-09-20 NOTE — Telephone Encounter (Signed)
[address]       [name] [address]             [phone]     CARDIOTHORACIC SURGERY TELEPHONE VIRTUAL OFFICE NOTE  Referring Provider is No ref. provider found Primary Cardiologist is Verne Carrow, MD PCP is [name], NP   HPI:  I spoke with Randall Patel (DOB [birth year] ) via telephone on 09/20/2019 at 2:25 PM and verified that I was speaking with the correct person using more than one form of identification.  We discussed the reason(s) for conducting our visit virtually instead of in-person.  The patient expressed understanding the circumstances and agreed to proceed as described.  I called the patient at home and he was coherent and understanding.  I previously reviewed the chest x-ray that had been performed yesterday which showed minimal postoperative changes after left VATS for resection of apical blebs and pleurodesis.  The patient denies significant postoperative pain.  He denies shortness of breath.  He states the surgical incisions are dry and clean.  He does indicate that the chest tube sutures are still in place and we will have the patient return for a office visit for suture removal.   Current Outpatient Medications  Medication Sig Dispense Refill  . acetaminophen (TYLENOL) 325 MG tablet Take 2 tablets (650 mg total) by mouth every 6 (six) hours as needed for mild pain, fever or headache.    [name] aspirin EC 81 MG tablet Take 81 mg by mouth daily.    [name] atorvastatin (LIPITOR) 80 MG tablet Take 1 tablet (80 mg total) by mouth daily at 6 PM. 90 tablet 3  . carvedilol (COREG) 3.125 MG tablet Take 1 tablet (3.125 mg total) by mouth 2 (two) times daily with a meal. 60 tablet 1  . Multiple Vitamin (MULTIVITAMIN WITH MINERALS) TABS tablet Take 1 tablet by mouth daily.    . pantoprazole (PROTONIX) 40 MG tablet Take 1 tablet (40 mg total) by mouth daily. 30 tablet 0  . thiamine 100 MG tablet Take 1 tablet (100 mg total) by mouth daily.  (Patient not taking: Reported on 08/24/2019) 30 tablet 0  . traMADol (ULTRAM) 50 MG tablet Take 1 tablet (50 mg total) by mouth every 8 (eight) hours as needed (mild pain). 14 tablet 0   No current facility-administered medications for this visit.     Diagnostic Tests:  Chest x-ray image personally reviewed taken June 9 which shows good reexpansion of the lung no pneumothorax or pleural effusion.   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  This was effectively treated with chest tube followed by a VATS with bleb resection and pleurodesis.  He has recovered well from the procedure and his chest x-ray is satisfactory.  He that he should completely stop smoking.  We discussed activity limits and wound care over the phone.   Plan: Patient will be scheduled to return in the next week to the office for suture removal of chest tube sutures .  No further x-rays or studies are needed.     I discussed limitations of evaluation and management via telephone.  The patient was advised to call back for repeat telephone consultation or to seek an in-person evaluation if questions arise or the patient's clinical condition changes in any significant manner.  I spent in excess of 5 minutes of non-face-to-face time during the conduct of this telephone virtual office consultation.  Level 1  (99441)             5-10 minutes Level 2  (99442)            11-20 minutes Level 3  (99443)            21-30 minutes   09/20/2019 2:25 PM

## 2019-09-26 ENCOUNTER — Other Ambulatory Visit: Payer: Self-pay

## 2019-09-26 ENCOUNTER — Ambulatory Visit (INDEPENDENT_AMBULATORY_CARE_PROVIDER_SITE_OTHER): Payer: Self-pay

## 2019-09-26 VITALS — Temp 97.7°F

## 2019-09-26 DIAGNOSIS — Z4802 Encounter for removal of sutures: Secondary | ICD-10-CM

## 2019-09-26 DIAGNOSIS — Z9889 Other specified postprocedural states: Secondary | ICD-10-CM

## 2019-09-26 NOTE — Progress Notes (Signed)
Pt comes in for suture removal s/p robotic lung surgery on 09/07/19. He is a/o and without c/o. Incisions to L upper/lateral back and L lateral chest are c/d/i and well approximated--all w/ surgical glue except for incision to L lateral chest, which has two sutures in place. Thick scabbing surrounding sutures. Sutures are easily removed per standard protocol. Scabbing came off with sutures to reveal partially open (superficial) wound w/ pink tissue and scant amount of tan purulent drainage. No swelling or redness noted. Photo taken of area and sent to D. Joycelyn Man, [place]. She advises for pt to wash incision site daily w/ soap and water, pat dry, and cover w/ a band aid for several days, then to leave area open to air; pt to notify [facility] if he develops more purulent drainage or other s/s of infection. Passed this information along to pt, and he verbalizes understanding.

## 2019-10-10 ENCOUNTER — Ambulatory Visit: Payer: Medicare HMO | Admitting: Cardiovascular Disease

## 2019-10-10 NOTE — Progress Notes (Deleted)
No chief complaint on file.   History of Present Illness: 69 yo male with history of CAD, CVA , ischemic cardiomyopathy, etoh abuse, seizures here today for cardiac follow up. He had his first MI in 2008. He was admitted with bradycardia, chest pain and taken to cath lab as urgent NSTEMI 04/11/12 and was found to have a totally occluded distal RCA. This was treated with a drug eluting stent in the mid to distal RCA and angioplasty of the Posterolateral segment. His LVEF post MI was 35-40%. He was admitted to [facility] December 2016 with right frontoparietal IC hemorrhage in the setting of uncontrolled HTN. Echo during that admission demonstrated LVEF 40-45%.  There was concern for right frontal encephalomalacia and concern for embolic infarcts. Outpatient event monitor planned. He was discharged to inpatient rehabilitation and seen in follow up in our office March 2017. Repeat imaging demonstrated resolution of IC hemorrhage. ASA started in Neurology clinic. Cardiac event monitor May 2017 with sinus rhythm, no atrial fibrillation or heart block. Admitted to [facility] December 2017 with PEA arrest and found to have CHF and encephalopathy. Echo December 2017 with LVEF=45-50%. Mild MR. Repeat echo in December 2019 with LVEF=35-40%. He had a spontaneous pneumothorax in June 2021 and had robotic assisted wedge resection of the left lung.   He is here today for follow up. The patient denies any chest pain, dyspnea, palpitations, lower extremity edema, orthopnea, PND, dizziness, near syncope or syncope.    Primary Care Physician: [name], NP   Past Medical History:  Diagnosis Date  . Anginal pain (HCC)   . Arthritis   . CAD (coronary artery disease)    a. MI in 2008 w PL stent b. MI 03/2012: 30% ostial LM stenosis, 30% pLAD stenosis, 60% mid-LADm 30% pCx, 3rd OM which was small in size with diffuse 90% stenosis involving both branches, and RCA with 50% mid-stenosis and 100% distal occlusion. POBA  to PL branch and DES placement to dRCA.  [name] CHF (congestive heart failure) (HCC)   . Claudication (HCC)   . CVA (cerebral infarction)    03/2012 with left arm weakness/numbness and left facial droop.  [name] ETOH abuse    quit 04/06/12  . HTN (hypertension)   . Hyperlipidemia   . Ischemic cardiomyopathy    a. EF 35-40% by echo 2014 b.EF 40-45% by echo in 03/2015  . Myocardial infarction (HCC)   . Seizures (HCC)   . Stroke (HCC) 03/2015   R frontal hemorrhage  . Tobacco abuse    quit 04/06/12  . Withdrawal seizures [facility])     Past Surgical History:  Procedure Laterality Date  . CORONARY ANGIOPLASTY WITH STENT PLACEMENT  03/2012  . HERNIA REPAIR    . INTERCOSTAL NERVE BLOCK Left 09/07/2019   Procedure: Intercostal Nerve Block;  Surgeon: Delight Ovens, MD;  Location: [facility] OR;  Service: Thoracic;  Laterality: Left;  . LEFT HEART CATHETERIZATION WITH CORONARY ANGIOGRAM N/A 04/11/2012   Procedure: LEFT HEART CATHETERIZATION WITH CORONARY ANGIOGRAM;  Surgeon: Kathleene Hazel, MD;  Location: [facility] CATH LAB;  Service: Cardiovascular;  Laterality: N/A;  . PERCUTANEOUS CORONARY INTERVENTION-BALLOON ONLY  04/11/2012   Procedure: PERCUTANEOUS CORONARY INTERVENTION-BALLOON ONLY;  Surgeon: Kathleene Hazel, MD;  Location: [facility] CATH LAB;  Service: Cardiovascular;;  RPLA  . PERCUTANEOUS CORONARY STENT INTERVENTION (PCI-S)  04/11/2012   Procedure: PERCUTANEOUS CORONARY STENT INTERVENTION (PCI-S);  Surgeon: Kathleene Hazel, MD;  Location: [facility] CATH LAB;  Service: Cardiovascular;;  Distal RCA  . PLEURADESIS  Left 09/07/2019   Procedure: Mechanical Pleuradesis;  Surgeon: Delight Ovens, MD;  Location: [facility] OR;  Service: Thoracic;  Laterality: Left;  . PLEURECTOMY Left 09/07/2019   Procedure: Apical Pleurectomy;  Surgeon: Delight Ovens, MD;  Location: [facility] OR;  Service: Thoracic;  Laterality: Left;    Current Outpatient Medications  Medication Sig Dispense Refill  . acetaminophen  (TYLENOL) 325 MG tablet Take 2 tablets (650 mg total) by mouth every 6 (six) hours as needed for mild pain, fever or headache.    [name] aspirin EC 81 MG tablet Take 81 mg by mouth daily.    [name] atorvastatin (LIPITOR) 80 MG tablet Take 1 tablet (80 mg total) by mouth daily at 6 PM. 90 tablet 3  . carvedilol (COREG) 3.125 MG tablet Take 1 tablet (3.125 mg total) by mouth 2 (two) times daily with a meal. 60 tablet 1  . Multiple Vitamin (MULTIVITAMIN WITH MINERALS) TABS tablet Take 1 tablet by mouth daily.    . pantoprazole (PROTONIX) 40 MG tablet Take 1 tablet (40 mg total) by mouth daily. 30 tablet 0  . thiamine 100 MG tablet Take 1 tablet (100 mg total) by mouth daily. (Patient not taking: Reported on 08/24/2019) 30 tablet 0  . traMADol (ULTRAM) 50 MG tablet Take 1 tablet (50 mg total) by mouth every 8 (eight) hours as needed (mild pain). 14 tablet 0   No current facility-administered medications for this visit.    Allergies  Allergen Reactions  . Lisinopril Swelling and Other (See Comments)    angioedema    Social History   Socioeconomic History  . Marital status: Single    Spouse name: Not on file  . Number of children: Not on file  . Years of education: Not on file  . Highest education level: Not on file  Occupational History  . Occupation: Disabled  Tobacco Use  . Smoking status: Current Every Day Smoker    Packs/day: 0.50    Years: 40.00    Pack years: 20.00    Types: Cigarettes  . Smokeless tobacco: Never Used  Substance and Sexual Activity  . Alcohol use: Yes    Comment: "bourbon"  . Drug use: No    Types: Benzodiazepines, Marijuana  . Sexual activity: Never  Other Topics Concern  . Not on file  Social History Narrative   Merged History Encounter    Patient lives with his sister, who cares for him.       Social Determinants of Health   Financial Resource Strain:   . Difficulty of Paying Living Expenses:   Food Insecurity:   . Worried About Programme researcher, broadcasting/film/video in  the Last Year:   . Barista in the Last Year:   Transportation Needs:   . Freight forwarder (Medical):   [name] Lack of Transportation (Non-Medical):   Physical Activity:   . Days of Exercise per Week:   . Minutes of Exercise per Session:   Stress:   . Feeling of Stress :   Social Connections:   . Frequency of Communication with Friends and Family:   . Frequency of Social Gatherings with Friends and Family:   . Attends Religious Services:   . Active Member of Clubs or Organizations:   . Attends Banker Meetings:   [name] Marital Status:   Intimate Partner Violence:   . Fear of Current or Ex-Partner:   . Emotionally Abused:   [name] Physically Abused:   . Sexually Abused:  Family History  Problem Relation Age of Onset  . Heart attack Father   . Hypertension Father   . Cancer Mother   . Stroke Sister   . Hypertension Mother   . Hypertension Sister   . Hypertension Brother     Review of Systems:  As stated in the HPI and otherwise negative.   There were no vitals taken for this visit.  Physical Examination:  General: Well developed, well nourished, NAD  HEENT: OP clear, mucus membranes moist  SKIN: warm, dry. No rashes. Neuro: No focal deficits  Musculoskeletal: Muscle strength 5/5 all ext  Psychiatric: Mood and affect normal  Neck: No JVD, no carotid bruits, no thyromegaly, no lymphadenopathy.  Lungs:Clear bilaterally, no wheezes, rhonci, crackles Cardiovascular: Regular rate and rhythm. No murmurs, gallops or rubs. Abdomen:Soft. Bowel sounds present. Non-tender.  Extremities: No lower extremity edema. Pulses are 2 + in the bilateral DP/PT.  Echo: 04/03/18:  - Left ventricle: Diffuse hypokinesis worse in the inferor wall.  The cavity size was moderately dilated. Wall thickness was  normal. Systolic function was moderately reduced. The estimated  ejection fraction was in the range of 35% to 40%. Left  ventricular diastolic function  parameters were normal.  - Mitral valve: Calcified annulus. Mildly thickened leaflets .  - Atrial septum: No defect or patent foramen ovale was identified.  Cardiac cath 04/11/12: Left main: Ostial 30% stenosis with heavy calcification.  Left Anterior Descending Artery: Large caliber vessel that courses to the apex. The proximal vessel has 30% stenosis. The mid vessel has a 60% stenosis at the takeoff of a septal perforating branch. Moderate caliber diagonal branch with mild plaque disease.  Circumflex Artery: Moderate caliber vessel. The proximal vessel has diffuse 30% stenosis. The first 2 obtuse marginal branches are very small. The third obtuse marginal branch is a small bifurcating vessel with diffuse 90% stenosis involving both branches. (1.75 mm vessel)  Right Coronary Artery: Large, dominant artery with 50% mid stenosis and 100% distal occlusion.  Left Ventricular Angiogram: LVEF=30-35% with akinesis of the inferior wall.  EKG:  EKG is not *** ordered today. The ekg ordered today demonstrates   Recent Labs: 03/20/2019: TSH 1.020 09/09/2019: ALT 19; BUN 9; Creatinine, Ser 0.87; Hemoglobin 10.3; Platelets 253; Potassium 4.0; Sodium 135   Lipid Panel    Component Value Date/Time   CHOL 148 03/20/2019 1421   TRIG 40 03/20/2019 1421   HDL 79 03/20/2019 1421   CHOLHDL 1.9 03/20/2019 1421   CHOLHDL 2.2 04/02/2018 2306   VLDL 18 04/02/2018 2306   LDLCALC 59 03/20/2019 1421     Wt Readings from Last 3 Encounters:  08/24/19 130 lb (59 kg)  03/20/19 128 lb 9.6 oz (58.3 kg)  02/09/19 128 lb 15.5 oz (58.5 kg)     Other studies Reviewed: Additional studies/ records that were reviewed today include: . Review of the above records demonstrates:    Assessment and Plan:   1. CAD without angina: No chest pain. Mild LV dysfunction chronically. Continue ASA, statin and beta blocker.     2. Ischemic cardiomyopathy: LVEF 35-40% by echo December 2019. Continue beta blocker. ? ARB. ***    3. Tobacco abuse: I have advised him to stop smoking. He does not wish to stop smoking.   4. HTN: BP controlled.   5. Hyperlipidemia: Lipids followed in primary care. Continue statin  6. History of CVA: He is followed in Neurology.   Current medicines are reviewed at length with the patient today.  The patient does not have concerns regarding medicines.  The following changes have been made:  no change  Labs/ tests ordered today include:  No orders of the defined types were placed in this encounter.   Disposition:   FU with me in 12 months  Signed, Verne Carrow, MD 10/10/2019 10:58 AM    [facility] [address] [place], [place], [place]  [address] Phone: [phone]; Fax: [phone]

## 2019-10-17 ENCOUNTER — Other Ambulatory Visit: Payer: Self-pay | Admitting: Cardiothoracic Surgery

## 2019-10-17 DIAGNOSIS — Z9889 Other specified postprocedural states: Secondary | ICD-10-CM

## 2019-10-25 ENCOUNTER — Encounter: Payer: Self-pay | Admitting: Cardiothoracic Surgery

## 2019-10-25 NOTE — Progress Notes (Deleted)
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 SURGICAL PROCEDURE:  Left robotic-assisted wedge resection of apical blebs x2, apical pleurectomy with pleurodesis and intercostal nerve blocks with Exparel. SURGEON:  Sheliah Plane, MD  History of Present Illness:          Past Medical History:  Diagnosis Date  . Anginal pain (HCC)   . Arthritis   . CAD (coronary artery disease)    a. MI in 2008 w PL stent b. MI 03/2012: 30% ostial LM stenosis, 30% pLAD stenosis, 60% mid-LADm 30% pCx, 3rd OM which was small in size with diffuse 90% stenosis involving both branches, and RCA with 50% mid-stenosis and 100% distal occlusion. POBA to PL branch and DES placement to dRCA.  [name] CHF (congestive heart failure) (HCC)   . Claudication (HCC)   . CVA (cerebral infarction)    03/2012 with left arm weakness/numbness and left facial droop.  [name] ETOH abuse    quit 04/06/12  . HTN (hypertension)   . Hyperlipidemia   . Ischemic cardiomyopathy    a. EF 35-40% by echo 2014 b.EF 40-45% by echo in 03/2015  . Myocardial infarction (HCC)   . Seizures (HCC)   . Stroke (HCC) 03/2015   R frontal hemorrhage  . Tobacco abuse    quit 04/06/12  . Withdrawal seizures (HCC)      Social History   Tobacco Use  Smoking Status Current Every Day Smoker  . Packs/day: 0.50  . Years: 40.00  . Pack years: 20.00  . Types: Cigarettes  Smokeless Tobacco Never Used    Social History   Substance and Sexual Activity  Alcohol Use  Yes   Comment: "bourbon"     Allergies  Allergen Reactions  . Lisinopril Swelling and Other (See Comments)    angioedema    Current Outpatient Medications  Medication Sig Dispense Refill  . acetaminophen (TYLENOL) 325 MG tablet Take 2 tablets (650 mg total) by mouth every 6 (six) hours as needed for mild pain, fever or headache.    [name] aspirin EC 81 MG tablet Take 81 mg by mouth daily.    [name] atorvastatin (LIPITOR) 80 MG tablet Take 1 tablet (80 mg total) by mouth daily at 6 PM. 90 tablet 3  . carvedilol (COREG) 3.125 MG tablet Take 1 tablet (3.125 mg total) by mouth 2 (two) times daily with a meal. 60 tablet 1  . Multiple Vitamin (MULTIVITAMIN WITH MINERALS) TABS tablet Take 1 tablet by mouth daily.    . pantoprazole (PROTONIX) 40 MG tablet Take 1 tablet (40 mg total) by mouth daily. 30 tablet 0  . thiamine 100 MG tablet Take 1 tablet (100 mg total) by mouth daily. (Patient not taking: Reported on 08/24/2019) 30 tablet 0  . traMADol (ULTRAM) 50 MG tablet Take 1 tablet (50 mg total) by mouth every 8 (eight) hours as needed (mild pain). 14 tablet 0   No current facility-administered medications for this visit.  Physical Exam: There were no vitals taken for this visit.  {Physical HUTM:5465035}   Diagnostic Studies & Laboratory data:     Recent Radiology Findings:   No results found.    Recent Lab Findings: Lab Results  Component Value Date   WBC 9.7 09/09/2019   HGB 10.3 (L) 09/09/2019   HCT 32.1 (L) 09/09/2019   PLT 253 09/09/2019   GLUCOSE 98 09/09/2019   CHOL 148 03/20/2019   TRIG 40 03/20/2019   HDL 79 03/20/2019   LDLCALC 59 03/20/2019   ALT 19 09/09/2019   AST 22 09/09/2019   NA 135 09/09/2019   K 4.0 09/09/2019   CL 100 09/09/2019   CREATININE 0.87 09/09/2019   BUN 9 09/09/2019   CO2 25 09/09/2019   TSH 1.020 03/20/2019   INR 1.0 08/24/2019   HGBA1C 5.6 04/02/2018      Assessment / Plan:        Medication Changes: No orders of the defined  types were placed in this encounter.     Delight Ovens MD      [address] [place] [place] [address] Office [phone]     10/25/2019 8:55 AM

## 2019-11-12 ENCOUNTER — Other Ambulatory Visit: Payer: Self-pay | Admitting: Physician Assistant

## 2019-11-13 ENCOUNTER — Other Ambulatory Visit: Payer: Self-pay | Admitting: Physician Assistant

## 2019-11-20 ENCOUNTER — Other Ambulatory Visit: Payer: Self-pay | Admitting: Physician Assistant

## 2019-11-22 ENCOUNTER — Ambulatory Visit
Admission: RE | Admit: 2019-11-22 | Discharge: 2019-11-22 | Disposition: A | Discharge status: Home / Self Care | Payer: Medicare HMO | Source: Ambulatory Visit | Attending: Cardiothoracic Surgery | Admitting: Cardiothoracic Surgery

## 2019-11-22 ENCOUNTER — Ambulatory Visit (INDEPENDENT_AMBULATORY_CARE_PROVIDER_SITE_OTHER): Payer: Self-pay | Admitting: Cardiothoracic Surgery

## 2019-11-22 ENCOUNTER — Other Ambulatory Visit: Payer: Self-pay

## 2019-11-22 VITALS — BP 170/83 | HR 78 | Temp 97.7°F | Resp 20 | Ht 63.0 in | Wt 136.0 lb

## 2019-11-22 DIAGNOSIS — Z9889 Other specified postprocedural states: Secondary | ICD-10-CM

## 2019-11-22 DIAGNOSIS — J939 Pneumothorax, unspecified: Secondary | ICD-10-CM

## 2019-11-22 NOTE — Progress Notes (Signed)
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 SURGICAL PROCEDURE:  Left robotic-assisted wedge resection of apical blebs x2, apical pleurectomy with pleurodesis and intercostal nerve blocks with Exparel.    History of Present Illness:     Patient returns office today in follow-up after left robotic assisted wedge resection of apical blebs and apical pleurectomy for recurrent pneumothorax on the left.  The patient is doing well postoperatively returning to near normal activities, even considering the limitations that he has with his previous stroke.     Past Medical History:  Diagnosis Date  . Anginal pain (HCC)   . Arthritis   . CAD (coronary artery disease)    a. MI in 2008 w PL stent b. MI 03/2012: 30% ostial LM stenosis, 30% pLAD stenosis, 60% mid-LADm 30% pCx, 3rd OM which was small in size with diffuse 90% stenosis involving both branches, and RCA with 50% mid-stenosis and 100% distal occlusion. POBA to PL branch and DES placement to dRCA.  [name] CHF (congestive heart failure) (HCC)   . Claudication (HCC)   . CVA (cerebral infarction)    03/2012 with left arm weakness/numbness and left facial droop.  [name] ETOH abuse    quit 04/06/12  . HTN (hypertension)   . Hyperlipidemia   . Ischemic cardiomyopathy    a. EF 35-40% by echo 2014 b.EF 40-45% by echo in 03/2015  . Myocardial infarction (HCC)   . Seizures (HCC)   . Stroke (HCC) 03/2015   R frontal hemorrhage  . Tobacco abuse    quit 04/06/12  .  Withdrawal seizures (HCC)      Social History   Tobacco Use  Smoking Status Current Every Day Smoker  . Packs/day: 0.50  . Years: 40.00  . Pack years: 20.00  . Types: Cigarettes  Smokeless Tobacco Never Used    Social History   Substance and Sexual Activity  Alcohol Use Yes   Comment: "bourbon"     Allergies  Allergen Reactions  . Lisinopril Swelling and Other (See Comments)    angioedema    Current Outpatient Medications  Medication Sig Dispense Refill  . acetaminophen (TYLENOL) 325 MG tablet Take 2 tablets (650 mg total) by mouth every 6 (six) hours as needed for mild pain, fever or headache.    [name] aspirin EC 81 MG tablet Take 81 mg by mouth daily.    [name] atorvastatin (LIPITOR) 80 MG tablet Take 1 tablet (80 mg total) by mouth daily at 6 PM. 90 tablet 3  . carvedilol (COREG) 3.125 MG tablet Take 1 tablet (3.125 mg total) by mouth 2 (two) times daily with a meal. 60 tablet 1  . Multiple Vitamin (MULTIVITAMIN WITH MINERALS) TABS tablet Take 1 tablet by mouth daily.    . pantoprazole (PROTONIX) 40 MG tablet Take 1 tablet (40 mg total) by mouth daily. 30 tablet 0  . thiamine 100 MG tablet Take 1 tablet (100 mg total) by mouth daily. 30 tablet 0  . traMADol (  ULTRAM) 50 MG tablet Take 1 tablet (50 mg total) by mouth every 8 (eight) hours as needed (mild pain). 14 tablet 0   No current facility-administered medications for this visit.       Physical Exam: BP (!) 170/83   Pulse 78   Temp 97.7 F (36.5 C) (Skin)   Resp 20   Ht 5\' 3"  (1.6 m)   Wt 136 lb (61.7 kg)   SpO2 100% Comment: RA  BMI 24.09 kg/m   General appearance: alert and cooperative Neurologic: intact Heart: regular rate and rhythm, S1, S2 normal, no murmur, click, rub or gallop Lungs: clear to auscultation bilaterally Abdomen: soft, non-tender; bowel sounds normal; no masses,  no organomegaly Extremities: extremities normal, atraumatic, no cyanosis or edema Wound: Robotic port sites are all  well-healed   Diagnostic Studies & Laboratory data:     Recent Radiology Findings:   DG Chest 2 View  Result Date: 11/22/2019 CLINICAL DATA:  69 year old male status post robotic assisted surgical procedure. EXAM: CHEST - 2 VIEW COMPARISON:  Chest radiograph dated 09/19/2019. FINDINGS: Near complete resolution of the previously seen bilateral lower lung field atelectasis with decrease in the size of the left pleural effusion. Trace left pleural effusion remains. No focal consolidation or pneumothorax. The cardiac silhouette is within limits. Atherosclerotic calcification of the aortic arch. No acute osseous pathology. IMPRESSION: Trace left pleural effusion. Near complete resolution of the previously seen bilateral lower lobe atelectasis. Electronically Signed   By: 11/19/2019 M.D.   On: 11/22/2019 16:32      Recent Lab Findings: Lab Results  Component Value Date   WBC 9.7 09/09/2019   HGB 10.3 (L) 09/09/2019   HCT 32.1 (L) 09/09/2019   PLT 253 09/09/2019   GLUCOSE 98 09/09/2019   CHOL 148 03/20/2019   TRIG 40 03/20/2019   HDL 79 03/20/2019   LDLCALC 59 03/20/2019   ALT 19 09/09/2019   AST 22 09/09/2019   NA 135 09/09/2019   K 4.0 09/09/2019   CL 100 09/09/2019   CREATININE 0.87 09/09/2019   BUN 9 09/09/2019   CO2 25 09/09/2019   TSH 1.020 03/20/2019   INR 1.0 08/24/2019   HGBA1C 5.6 04/02/2018      Assessment / Plan:      #1 status post wedge resection and apical pleurectomy for recurrent spontaneous pneumothorax on the left-there is no evidence of recurrence and the patient is making good progress postoperatively.  Plan follow-up as necessary Medication Changes: No orders of the defined types were placed in this encounter.     04/04/2018 MD      [address] [place] [place] Office [phone]     11/22/2019 4:41 PM

## 2019-11-24 ENCOUNTER — Other Ambulatory Visit: Payer: Self-pay | Admitting: Physician Assistant
